# Patient Record
Sex: Male | Born: 1945 | Race: White | Hispanic: No | Marital: Married | Smoking: Former smoker
Health system: Southern US, Community
[De-identification: ages and names within clinical notes are randomized; demographics above are authoritative.]

## PROBLEM LIST (undated history)

## (undated) DIAGNOSIS — E785 Hyperlipidemia, unspecified: Secondary | ICD-10-CM

## (undated) DIAGNOSIS — I10 Essential (primary) hypertension: Secondary | ICD-10-CM

## (undated) DIAGNOSIS — I6340 Cerebral infarction due to embolism of unspecified cerebral artery: Principal | ICD-10-CM

## (undated) DIAGNOSIS — E119 Type 2 diabetes mellitus without complications: Secondary | ICD-10-CM

## (undated) DIAGNOSIS — I499 Cardiac arrhythmia, unspecified: Secondary | ICD-10-CM

## (undated) DIAGNOSIS — R0683 Snoring: Secondary | ICD-10-CM

## (undated) HISTORY — DX: Snoring: R06.83

## (undated) HISTORY — PX: TONSILLECTOMY: SUR1361

## (undated) HISTORY — PX: UMBILICAL HERNIA REPAIR: SHX2598

---

## 2012-11-22 DIAGNOSIS — Z125 Encounter for screening for malignant neoplasm of prostate: Secondary | ICD-10-CM | POA: Diagnosis not present

## 2012-11-22 DIAGNOSIS — E119 Type 2 diabetes mellitus without complications: Secondary | ICD-10-CM | POA: Diagnosis not present

## 2012-11-22 DIAGNOSIS — I10 Essential (primary) hypertension: Secondary | ICD-10-CM | POA: Diagnosis not present

## 2012-11-22 DIAGNOSIS — E669 Obesity, unspecified: Secondary | ICD-10-CM | POA: Diagnosis not present

## 2013-02-04 DIAGNOSIS — H1030 Unspecified acute conjunctivitis, unspecified eye: Secondary | ICD-10-CM | POA: Diagnosis not present

## 2013-02-08 DIAGNOSIS — H1030 Unspecified acute conjunctivitis, unspecified eye: Secondary | ICD-10-CM | POA: Diagnosis not present

## 2013-03-01 DIAGNOSIS — H2510 Age-related nuclear cataract, unspecified eye: Secondary | ICD-10-CM | POA: Diagnosis not present

## 2013-06-03 ENCOUNTER — Encounter (HOSPITAL_COMMUNITY): Payer: Self-pay | Admitting: Radiology

## 2013-06-03 ENCOUNTER — Inpatient Hospital Stay (HOSPITAL_COMMUNITY): Payer: Medicare Other

## 2013-06-03 ENCOUNTER — Emergency Department (HOSPITAL_COMMUNITY): Payer: Medicare Other

## 2013-06-03 ENCOUNTER — Inpatient Hospital Stay (HOSPITAL_COMMUNITY)
Admission: EM | Admit: 2013-06-03 | Discharge: 2013-06-07 | DRG: 041 | Disposition: A | Discharge status: Rehabilitation Facility | Payer: Medicare Other | Attending: Neurology | Admitting: Neurology

## 2013-06-03 DIAGNOSIS — R5383 Other fatigue: Secondary | ICD-10-CM

## 2013-06-03 DIAGNOSIS — I10 Essential (primary) hypertension: Secondary | ICD-10-CM | POA: Diagnosis not present

## 2013-06-03 DIAGNOSIS — I6789 Other cerebrovascular disease: Secondary | ICD-10-CM | POA: Diagnosis not present

## 2013-06-03 DIAGNOSIS — Z87891 Personal history of nicotine dependence: Secondary | ICD-10-CM

## 2013-06-03 DIAGNOSIS — J329 Chronic sinusitis, unspecified: Secondary | ICD-10-CM | POA: Diagnosis present

## 2013-06-03 DIAGNOSIS — R079 Chest pain, unspecified: Secondary | ICD-10-CM | POA: Diagnosis not present

## 2013-06-03 DIAGNOSIS — R29810 Facial weakness: Secondary | ICD-10-CM | POA: Diagnosis present

## 2013-06-03 DIAGNOSIS — Z6835 Body mass index (BMI) 35.0-35.9, adult: Secondary | ICD-10-CM | POA: Diagnosis not present

## 2013-06-03 DIAGNOSIS — I6340 Cerebral infarction due to embolism of unspecified cerebral artery: Principal | ICD-10-CM

## 2013-06-03 DIAGNOSIS — R29818 Other symptoms and signs involving the nervous system: Secondary | ICD-10-CM | POA: Diagnosis not present

## 2013-06-03 DIAGNOSIS — R414 Neurologic neglect syndrome: Secondary | ICD-10-CM | POA: Diagnosis present

## 2013-06-03 DIAGNOSIS — I639 Cerebral infarction, unspecified: Secondary | ICD-10-CM

## 2013-06-03 DIAGNOSIS — E119 Type 2 diabetes mellitus without complications: Secondary | ICD-10-CM | POA: Diagnosis present

## 2013-06-03 DIAGNOSIS — E669 Obesity, unspecified: Secondary | ICD-10-CM | POA: Diagnosis present

## 2013-06-03 DIAGNOSIS — G8190 Hemiplegia, unspecified affecting unspecified side: Secondary | ICD-10-CM | POA: Diagnosis present

## 2013-06-03 DIAGNOSIS — I452 Bifascicular block: Secondary | ICD-10-CM | POA: Diagnosis present

## 2013-06-03 DIAGNOSIS — Z7982 Long term (current) use of aspirin: Secondary | ICD-10-CM | POA: Diagnosis not present

## 2013-06-03 DIAGNOSIS — I739 Peripheral vascular disease, unspecified: Secondary | ICD-10-CM | POA: Diagnosis present

## 2013-06-03 DIAGNOSIS — E785 Hyperlipidemia, unspecified: Secondary | ICD-10-CM | POA: Diagnosis present

## 2013-06-03 DIAGNOSIS — I6330 Cerebral infarction due to thrombosis of unspecified cerebral artery: Secondary | ICD-10-CM | POA: Diagnosis not present

## 2013-06-03 DIAGNOSIS — M5382 Other specified dorsopathies, cervical region: Secondary | ICD-10-CM | POA: Diagnosis not present

## 2013-06-03 DIAGNOSIS — R471 Dysarthria and anarthria: Secondary | ICD-10-CM | POA: Diagnosis present

## 2013-06-03 DIAGNOSIS — Z5189 Encounter for other specified aftercare: Secondary | ICD-10-CM | POA: Diagnosis not present

## 2013-06-03 DIAGNOSIS — I6350 Cerebral infarction due to unspecified occlusion or stenosis of unspecified cerebral artery: Secondary | ICD-10-CM | POA: Diagnosis not present

## 2013-06-03 DIAGNOSIS — R5381 Other malaise: Secondary | ICD-10-CM | POA: Diagnosis not present

## 2013-06-03 DIAGNOSIS — R4789 Other speech disturbances: Secondary | ICD-10-CM | POA: Diagnosis not present

## 2013-06-03 DIAGNOSIS — I517 Cardiomegaly: Secondary | ICD-10-CM

## 2013-06-03 DIAGNOSIS — R930 Abnormal findings on diagnostic imaging of skull and head, not elsewhere classified: Secondary | ICD-10-CM | POA: Diagnosis not present

## 2013-06-03 HISTORY — DX: Hyperlipidemia, unspecified: E78.5

## 2013-06-03 HISTORY — DX: Cerebral infarction due to embolism of unspecified cerebral artery: I63.40

## 2013-06-03 HISTORY — DX: Essential (primary) hypertension: I10

## 2013-06-03 HISTORY — DX: Type 2 diabetes mellitus without complications: E11.9

## 2013-06-03 LAB — URINE MICROSCOPIC-ADD ON

## 2013-06-03 LAB — COMPREHENSIVE METABOLIC PANEL
ALT: 23 U/L (ref 0–53)
AST: 14 U/L (ref 0–37)
Albumin: 3.7 g/dL (ref 3.5–5.2)
Alkaline Phosphatase: 64 U/L (ref 39–117)
BUN: 14 mg/dL (ref 6–23)
CO2: 25 mEq/L (ref 19–32)
Calcium: 8.5 mg/dL (ref 8.4–10.5)
Chloride: 102 mEq/L (ref 96–112)
Creatinine, Ser: 0.88 mg/dL (ref 0.50–1.35)
GFR calc Af Amer: >90 mL/min (ref >90)
GFR calc non Af Amer: 88 mL/min — ABNORMAL LOW (ref >90)
Glucose, Bld: 126 mg/dL — ABNORMAL HIGH (ref 70–99)
Potassium: 3.5 mEq/L — ABNORMAL LOW (ref 3.7–5.3)
Sodium: 140 mEq/L (ref 137–147)
Total Bilirubin: 0.6 mg/dL (ref 0.3–1.2)
Total Protein: 6.9 g/dL (ref 6.0–8.3)

## 2013-06-03 LAB — CBC
HCT: 39.2 % (ref 39.0–52.0)
Hemoglobin: 14.1 g/dL (ref 13.0–17.0)
MCH: 33.4 pg (ref 26.0–34.0)
MCHC: 36 g/dL (ref 30.0–36.0)
MCV: 92.9 fL (ref 78.0–100.0)
Platelets: 172 10*3/uL (ref 150–400)
RBC: 4.22 MIL/uL (ref 4.22–5.81)
RDW: 14.2 % (ref 11.5–15.5)
WBC: 7.8 10*3/uL (ref 4.0–10.5)

## 2013-06-03 LAB — DIFFERENTIAL
Basophils Absolute: 0.1 10*3/uL (ref 0.0–0.1)
Basophils Relative: 1 % (ref 0–1)
Eosinophils Absolute: 0.3 10*3/uL (ref 0.0–0.7)
Eosinophils Relative: 4 % (ref 0–5)
Lymphocytes Relative: 36 % (ref 12–46)
Lymphs Abs: 2.9 10*3/uL (ref 0.7–4.0)
Monocytes Absolute: 0.7 10*3/uL (ref 0.1–1.0)
Monocytes Relative: 9 % (ref 3–12)
Neutro Abs: 3.9 10*3/uL (ref 1.7–7.7)
Neutrophils Relative %: 50 % (ref 43–77)

## 2013-06-03 LAB — GLUCOSE, CAPILLARY
Glucose-Capillary: 191 mg/dL — ABNORMAL HIGH (ref 70–99)
Glucose-Capillary: 142 mg/dL — ABNORMAL HIGH (ref 70–99)
Glucose-Capillary: 156 mg/dL — ABNORMAL HIGH (ref 70–99)
Glucose-Capillary: 170 mg/dL — ABNORMAL HIGH (ref 70–99)

## 2013-06-03 LAB — LIPID PANEL
Cholesterol: 145 mg/dL (ref 0–200)
HDL: 28 mg/dL — ABNORMAL LOW (ref >39)
LDL Cholesterol: 84 mg/dL (ref 0–99)
Total CHOL/HDL Ratio: 5.2 RATIO
Triglycerides: 163 mg/dL — ABNORMAL HIGH (ref <150)
VLDL: 33 mg/dL (ref 0–40)

## 2013-06-03 LAB — POCT I-STAT, CHEM 8
BUN: 13 mg/dL (ref 6–23)
Calcium, Ion: 1.12 mmol/L — ABNORMAL LOW (ref 1.13–1.30)
Chloride: 103 mEq/L (ref 96–112)
Creatinine, Ser: 1.1 mg/dL (ref 0.50–1.35)
Glucose, Bld: 125 mg/dL — ABNORMAL HIGH (ref 70–99)
HCT: 42 % (ref 39.0–52.0)
Hemoglobin: 14.3 g/dL (ref 13.0–17.0)
Potassium: 3.4 mEq/L — ABNORMAL LOW (ref 3.7–5.3)
Sodium: 140 mEq/L (ref 137–147)
TCO2: 24 mmol/L (ref 0–100)

## 2013-06-03 LAB — MRSA PCR SCREENING: MRSA by PCR: NEGATIVE

## 2013-06-03 LAB — TROPONIN I: Troponin I: <0.3 ng/mL (ref <0.30)

## 2013-06-03 LAB — RAPID URINE DRUG SCREEN, HOSP PERFORMED
Amphetamines: NOT DETECTED
Barbiturates: NOT DETECTED
Benzodiazepines: NOT DETECTED
Cocaine: NOT DETECTED
Opiates: NOT DETECTED
Tetrahydrocannabinol: NOT DETECTED

## 2013-06-03 LAB — PROTIME-INR
INR: 1.02 (ref 0.00–1.49)
Prothrombin Time: 13.2 seconds (ref 11.6–15.2)

## 2013-06-03 LAB — HEMOGLOBIN A1C
Hgb A1c MFr Bld: 6.2 % — ABNORMAL HIGH (ref <5.7)
Mean Plasma Glucose: 131 mg/dL — ABNORMAL HIGH (ref <117)

## 2013-06-03 LAB — URINALYSIS, ROUTINE W REFLEX MICROSCOPIC
Bilirubin Urine: NEGATIVE
Glucose, UA: 250 mg/dL — AB
Ketones, ur: 15 mg/dL — AB
Leukocytes, UA: NEGATIVE
Nitrite: NEGATIVE
Protein, ur: NEGATIVE mg/dL
Specific Gravity, Urine: 1.024 (ref 1.005–1.030)
Urobilinogen, UA: 0.2 mg/dL (ref 0.0–1.0)
pH: 7 (ref 5.0–8.0)

## 2013-06-03 LAB — POCT I-STAT TROPONIN I: Troponin i, poc: 0.04 ng/mL (ref 0.00–0.08)

## 2013-06-03 LAB — APTT: aPTT: 31 seconds (ref 24–37)

## 2013-06-03 LAB — ETHANOL: Alcohol, Ethyl (B): <11 mg/dL (ref 0–11)

## 2013-06-03 MED ORDER — LABETALOL HCL 5 MG/ML IV SOLN: 10.0000 mg | INTRAVENOUS | Status: DC | PRN

## 2013-06-03 MED ORDER — CLONIDINE HCL 0.3 MG PO TABS
0.3000 mg | ORAL_TABLET | Freq: Two times a day (BID) | ORAL | Status: DC
  Administered 2013-06-03 – 2013-06-07 (×8): 0.3 mg via ORAL
  Filled 2013-06-03 (×9): qty 1

## 2013-06-03 MED ORDER — HYDRALAZINE HCL 20 MG/ML IJ SOLN
10.0000 mg | INTRAMUSCULAR | Status: DC | PRN
  Administered 2013-06-03 – 2013-06-06 (×3): 10 mg via INTRAVENOUS
  Filled 2013-06-03 (×2): qty 1

## 2013-06-03 MED ORDER — AMOXICILLIN 500 MG PO CAPS
500.0000 mg | ORAL_CAPSULE | Freq: Three times a day (TID) | ORAL | Status: DC
  Administered 2013-06-03 – 2013-06-07 (×12): 500 mg via ORAL
  Filled 2013-06-03 (×14): qty 1

## 2013-06-03 MED ORDER — CARVEDILOL 12.5 MG PO TABS
12.5000 mg | ORAL_TABLET | Freq: Two times a day (BID) | ORAL | Status: DC
  Administered 2013-06-03 – 2013-06-07 (×9): 12.5 mg via ORAL
  Filled 2013-06-03 (×10): qty 1

## 2013-06-03 MED ORDER — LABETALOL HCL 5 MG/ML IV SOLN
INTRAVENOUS | Status: AC
  Administered 2013-06-03: 10 mg
  Filled 2013-06-03: qty 4

## 2013-06-03 MED ORDER — GLYBURIDE 5 MG PO TABS
5.0000 mg | ORAL_TABLET | Freq: Every day | ORAL | Status: DC
  Administered 2013-06-04 – 2013-06-07 (×4): 5 mg via ORAL
  Filled 2013-06-03 (×5): qty 1

## 2013-06-03 MED ORDER — IOHEXOL 350 MG/ML SOLN
100.0000 mL | Freq: Once | INTRAVENOUS | Status: AC | PRN
  Administered 2013-06-03: 100 mL via INTRAVENOUS

## 2013-06-03 MED ORDER — NICARDIPINE HCL IN NACL 20-0.86 MG/200ML-% IV SOLN
5.0000 mg/h | INTRAVENOUS | Status: DC
  Administered 2013-06-03: 10 mg/h via INTRAVENOUS

## 2013-06-03 MED ORDER — HYDRALAZINE HCL 20 MG/ML IJ SOLN
INTRAMUSCULAR | Status: AC
  Filled 2013-06-03: qty 1

## 2013-06-03 MED ORDER — ACETAMINOPHEN 325 MG PO TABS
650.0000 mg | ORAL_TABLET | ORAL | Status: DC | PRN
  Administered 2013-06-03 – 2013-06-07 (×14): 650 mg via ORAL
  Filled 2013-06-03 (×14): qty 2

## 2013-06-03 MED ORDER — LABETALOL HCL 5 MG/ML IV SOLN
10.0000 mg | INTRAVENOUS | Status: DC | PRN
  Administered 2013-06-03 (×2): 10 mg via INTRAVENOUS
  Administered 2013-06-03 – 2013-06-06 (×3): 20 mg via INTRAVENOUS
  Administered 2013-06-06: 40 mg via INTRAVENOUS
  Filled 2013-06-03 (×2): qty 4
  Filled 2013-06-03: qty 8
  Filled 2013-06-03 (×2): qty 4

## 2013-06-03 MED ORDER — ACETAMINOPHEN 650 MG RE SUPP: 650.0000 mg | RECTAL | Status: DC | PRN

## 2013-06-03 MED ORDER — LISINOPRIL 40 MG PO TABS
40.0000 mg | ORAL_TABLET | Freq: Every day | ORAL | Status: DC
  Administered 2013-06-03 – 2013-06-07 (×5): 40 mg via ORAL
  Filled 2013-06-03 (×5): qty 1

## 2013-06-03 MED ORDER — SODIUM CHLORIDE 0.9 % IV SOLN
INTRAVENOUS | Status: DC
  Administered 2013-06-03 – 2013-06-06 (×5): via INTRAVENOUS
  Administered 2013-06-07: 1000 mL via INTRAVENOUS

## 2013-06-03 MED ORDER — ALTEPLASE (STROKE) FULL DOSE INFUSION
90.0000 mg | Freq: Once | INTRAVENOUS | Status: AC
  Administered 2013-06-03: 81 mg via INTRAVENOUS
  Filled 2013-06-03: qty 90

## 2013-06-03 MED ORDER — PANTOPRAZOLE SODIUM 40 MG IV SOLR
40.0000 mg | Freq: Every day | INTRAVENOUS | Status: DC
  Administered 2013-06-03 – 2013-06-06 (×4): 40 mg via INTRAVENOUS
  Filled 2013-06-03 (×5): qty 40

## 2013-06-03 MED ORDER — SENNOSIDES-DOCUSATE SODIUM 8.6-50 MG PO TABS
1.0000 | ORAL_TABLET | Freq: Every evening | ORAL | Status: DC | PRN
  Filled 2013-06-03: qty 1

## 2013-06-03 MED ORDER — NICARDIPINE HCL IN NACL 20-0.86 MG/200ML-% IV SOLN
3.0000 mg/h | INTRAVENOUS | Status: DC
  Administered 2013-06-03 (×3): 5 mg/h via INTRAVENOUS
  Filled 2013-06-03 (×4): qty 200

## 2013-06-03 MED ORDER — METFORMIN HCL ER 750 MG PO TB24
1500.0000 mg | ORAL_TABLET | Freq: Every day | ORAL | Status: DC
  Administered 2013-06-04 – 2013-06-07 (×4): 1500 mg via ORAL
  Filled 2013-06-03 (×5): qty 2

## 2013-06-03 MED ORDER — SODIUM CHLORIDE 0.9 % IV SOLN: 250.0000 mL | Freq: Once | INTRAVENOUS | Status: DC

## 2013-06-03 MED ORDER — INSULIN ASPART 100 UNIT/ML ~~LOC~~ SOLN
0.0000 [IU] | Freq: Three times a day (TID) | SUBCUTANEOUS | Status: DC
  Administered 2013-06-03: 2 [IU] via SUBCUTANEOUS
  Administered 2013-06-03 – 2013-06-04 (×4): 3 [IU] via SUBCUTANEOUS
  Administered 2013-06-05: 2 [IU] via SUBCUTANEOUS
  Administered 2013-06-05: 3 [IU] via SUBCUTANEOUS
  Administered 2013-06-06 – 2013-06-07 (×4): 2 [IU] via SUBCUTANEOUS

## 2013-06-03 MED ORDER — AMLODIPINE BESYLATE 5 MG PO TABS
5.0000 mg | ORAL_TABLET | Freq: Every day | ORAL | Status: DC
  Administered 2013-06-03 – 2013-06-05 (×3): 5 mg via ORAL
  Filled 2013-06-03 (×3): qty 1

## 2013-06-03 NOTE — ED Notes (Signed)
Assisting in transport of patient to CT and then on to a room assignment.

## 2013-06-03 NOTE — Progress Notes (Signed)
UR completed.  Kindred Reidinger, RN BSN MHA CCM Trauma/Neuro ICU Case Manager [phone]  

## 2013-06-03 NOTE — Evaluation (Signed)
Clinical/Bedside Swallow Evaluation Patient Details  Name: Donald [name] MRN: [id number] Date of Birth: 17-Feb-1946  Today's Date: 06/03/2013 Time: 1142-1200 SLP Time Calculation (min): 18 min  Past Medical History: History reviewed. No pertinent past medical history. Past Surgical History: History reviewed. No pertinent past surgical history. HPI:  68 y.o. male presenting with left facial droop, LHH, right gaze preference and left neglect. Patient with vascular risk factors of diabetes and hypertension. CT of the head reviewed and shows no acute changes. 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 tPA given.    Assessment / Plan / Recommendation Clinical Impression  Pt presents with evidence of a mild oral dysphagia with left buccal residuals and minimal anterior spillage. With sips of thin liquids there were no overt signs of aspiration with self fed small sips. Pt did have a very hard cough response with first sip given, total assist by RN, during stroke swallow screen. Pt appears able to tolerate liquids with precautions and soft solids with reminders to clear left buccal cavity. Discussed precautions with pt/family and RN.     Aspiration Risk  Moderate    Diet Recommendation Thin liquid;Dysphagia 2 (Fine chop)   Liquid Administration via: Cup;Straw Medication Administration: Whole meds with liquid Supervision: Patient able to self feed;Full supervision/cueing for compensatory strategies Compensations: Slow rate;Small sips/bites;Check for pocketing;Check for anterior loss Postural Changes and/or Swallow Maneuvers: Seated upright 90 degrees;Upright 30-60 min after meal    Other  Recommendations Oral Care Recommendations: Oral care BID   Follow Up Recommendations  24 hour supervision/assistance;Inpatient Rehab    Frequency and Duration min 2x/week  2 weeks   Pertinent Vitals/Pain NA    SLP Swallow Goals     Swallow Study Prior Functional Status       General HPI: 68 y.o.  male presenting with left facial droop, LHH, right gaze preference and left neglect. Patient with vascular risk factors of diabetes and hypertension. CT of the head reviewed and shows no acute changes. 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 tPA given.  Type of Study: Bedside swallow evaluation Diet Prior to this Study: NPO Temperature Spikes Noted: No Respiratory Status: Room air History of Recent Intubation: No Behavior/Cognition: Alert;Cooperative;Pleasant mood Oral Cavity - Dentition: Adequate natural dentition Self-Feeding Abilities: Able to feed self Patient Positioning: Upright in bed Baseline Vocal Quality: Clear Volitional Cough: Strong Volitional Swallow: Able to elicit    Oral/Motor/Sensory Function Overall Oral Motor/Sensory Function: Impaired Labial ROM: Reduced left Labial Symmetry: Abnormal symmetry left Labial Strength: Reduced Labial Sensation: Reduced Lingual ROM: Reduced left Lingual Symmetry: Abnormal symmetry right Lingual Strength: Reduced Lingual Sensation: Reduced Facial ROM: Reduced left Facial Strength: Reduced Facial Sensation: Reduced Velum: Within Functional Limits Mandible: Within Functional Limits   Ice Chips Ice chips: Within functional limits   Thin Liquid Thin Liquid: Impaired Presentation: Cup;Straw Oral Phase Impairments: Reduced labial seal Oral Phase Functional Implications: Right anterior spillage;Left anterior spillage;Prolonged oral transit    Nectar Thick Nectar Thick Liquid: Not tested   Honey Thick Honey Thick Liquid: Not tested   Puree Puree: Within functional limits   Solid   GO    Solid: Impaired Presentation: Self Fed Oral Phase Impairments: Reduced labial seal;Impaired anterior to posterior transit Oral Phase Functional Implications: Left anterior spillage;Left lateral sulci pocketing;Oral residue      [facility], [place] CCC-SLP [phone]   Lynann Beaver 06/03/2013,12:13 PM

## 2013-06-03 NOTE — ED Notes (Signed)
MD at bedside. Dr Reynolds 

## 2013-06-03 NOTE — H&P (Signed)
Admission H&P    Chief Complaint: Difficulty with speech, left sided weakness  HPI: Donald [name] is an 68 y.o. male who is visiting here for the holidays.  Awakened this morning to help his grandchildren to bed.  When he got up he was normal.  As he was returning to bed began to be short of breath.  Patient then was noted to have slurred speech by his wife and she noted a facial droop.  Patient seemed to be weak on the left a well.  EMS was called and the patient was brought in as a code stroke.  Initial NIHSS of 8.    Date last known well: Date: 06/03/2013 Time last known well: Time: 03:00 tPA Given: Yes  Past medical history: Diabetes, hypertension  Past surgical history None  Family history: Mother died with vascular dementia.  Father died in his 20's of a MI.  Has 3 half-sisters with CAD as well.  2 have died of MI's.    Social History:  The patient has quit smoking.  There is no history of alcohol or illicit drug abuse.  Allergies: No Known Allergies   Medications: Current outpatient prescriptions: amLODipine (NORVASC) 5 MG tablet, Take 5 mg by mouth daily., Disp: , Rfl: ;   aspirin EC 81 MG tablet, Take 81 mg by mouth daily., Disp: , Rfl: ;   carvedilol (COREG) 12.5 MG tablet, Take 12.5 mg by mouth 2 (two) times daily with a meal., Disp: , Rfl: ;   cloNIDine (CATAPRES) 0.3 MG tablet, Take 0.3 mg by mouth 2 (two) times daily., Disp: , Rfl:  glyBURIDE (DIABETA) 5 MG tablet, Take 5 mg by mouth daily with breakfast., Disp: , Rfl: ;   lisinopril (PRINIVIL,ZESTRIL) 40 MG tablet, Take 40 mg by mouth daily., Disp: , Rfl: ;   metFORMIN (GLUCOPHAGE-XR) 750 MG 24 hr tablet, Take 1,500 mg by mouth daily with breakfast., Disp: , Rfl:   ROS: History obtained from wife  General ROS: negative for - chills, fatigue, fever, night sweats, weight gain or weight loss Psychological ROS: negative for - behavioral disorder, hallucinations, memory difficulties, mood swings or suicidal  ideation Ophthalmic ROS: negative for - blurry vision, double vision, eye pain or loss of vision ENT ROS: negative for - epistaxis, nasal discharge, oral lesions, sore throat, tinnitus or vertigo Allergy and Immunology ROS: negative for - hives or itchy/watery eyes Hematological and Lymphatic ROS: negative for - bleeding problems, bruising or swollen lymph nodes Endocrine ROS: negative for - galactorrhea, hair pattern changes, polydipsia/polyuria or temperature intolerance Respiratory ROS: negative for - cough, hemoptysis, shortness of breath or wheezing Cardiovascular ROS: negative for - chest pain, dyspnea on exertion, edema or irregular heartbeat Gastrointestinal ROS: negative for - abdominal pain, diarrhea, hematemesis, nausea/vomiting or stool incontinence Genito-Urinary ROS: negative for - dysuria, hematuria, incontinence or urinary frequency/urgency Musculoskeletal ROS: negative for - joint swelling or muscular weakness Neurological ROS: as noted in HPI Dermatological ROS: negative for rash and skin lesion changes  Physical Examination: Blood pressure 170/82, pulse 71, resp. rate 21, SpO2 98.00%.  General Examination: HEENT-  Normocephalic, no lesions, without obvious abnormality.  Normal external eye and conjunctiva.  Normal TM's bilaterally.  Normal auditory canals and external ears. Normal external nose, mucus membranes and septum.  Normal pharynx. Neck supple with no masses, nodes, nodules or enlargement. Cardiovascular - S1, S2 normal Lungs - chest clear, no wheezing, rales, normal symmetric air entry Abdomen - soft, non-tender; bowel sounds normal; no masses,  no organomegaly,  protuberant Extremities - mild edema in the lower extremities bilaterally  Neurologic Examination: Mental Status: Lethargic, oriented, thought content appropriate.  Speech fluent but dysarthric.  Able to follow 3 step commands without difficulty.  Left neglect Cranial Nerves: II: Discs flat  bilaterally; LHH, pupils equal, round, reactive to light and accommodation III,IV, VI: ptosis not present, right gaze preference with patient able to voluntarily move eyes horizontally to the left.  Vertical gaze intact.  V,VII: left facial droop, facial light touch sensation decreased on the left VIII: hearing normal bilaterally IX,X: gag reflex reduced XI: bilateral shoulder shrug XII: midline tongue extension Motor: Right : Upper extremity   5/5    Left:     Upper extremity   5-/5  Lower extremity   5/5     Lower extremity   5/5 Tone and bulk:normal tone throughout; no atrophy noted Sensory: Pinprick and light touch intact throughout, bilaterally Deep Tendon Reflexes: 2+ and symmetric with absent AJ's bilaterally Plantars: Right: downgoing   Left: upgoing Cerebellar: normal finger-to-nose testing on the right and normal heel-to-shin testing bilaterally.  Patient unable to appreciate the left upper extremity in space. Gait: Unable to test CV: pulses palpable throughout   Laboratory Studies:   Basic Metabolic Panel:  Recent Labs Lab 06/03/13 0355 06/03/13 0400  NA 140 140  K 3.5* 3.4*  CL 102 103  CO2 25  --   GLUCOSE 126* 125*  BUN 14 13  CREATININE 0.88 1.10  CALCIUM 8.5  --     Liver Function Tests:  Recent Labs Lab 06/03/13 0355  AST 14  ALT 23  ALKPHOS 64  BILITOT 0.6  PROT 6.9  ALBUMIN 3.7   No results found for this basename: LIPASE, AMYLASE,  in the last 168 hours No results found for this basename: AMMONIA,  in the last 168 hours  CBC:  Recent Labs Lab 06/03/13 0355 06/03/13 0400  WBC 7.8  --   NEUTROABS 3.9  --   HGB 14.1 14.3  HCT 39.2 42.0  MCV 92.9  --   PLT 172  --     Cardiac Enzymes:  Recent Labs Lab 06/03/13 0355  TROPONINI <0.30    BNP: No components found with this basename: POCBNP,   CBG: No results found for this basename: GLUCAP,  in the last 168 hours  Microbiology: No results found for this or any previous  visit.  Coagulation Studies:  Recent Labs  06/03/13 0355  LABPROT 13.2  INR 1.02    Urinalysis: No results found for this basename: COLORURINE, APPERANCEUR, LABSPEC, PHURINE, GLUCOSEU, HGBUR, BILIRUBINUR, KETONESUR, PROTEINUR, UROBILINOGEN, NITRITE, LEUKOCYTESUR,  in the last 168 hours  Lipid Panel:  No results found for this basename: chol,  trig,  hdl,  cholhdl,  vldl,  ldlcalc    HgbA1C:  No results found for this basename: HGBA1C    Urine Drug Screen:   No results found for this basename: labopia,  cocainscrnur,  labbenz,  amphetmu,  thcu,  labbarb    Alcohol Level:   Recent Labs Lab 06/03/13 [place] <11    Other results: EKG: sinus rhythm at 70 bpm.  Imaging: Ct Head Wo Contrast  06/03/2013   CLINICAL DATA:  Left-sided weakness and slurred speech. Code stroke.  EXAM: CT HEAD WITHOUT CONTRAST  TECHNIQUE: Contiguous axial images were obtained from the base of the skull through the vertex without intravenous contrast.  COMPARISON:  None.  FINDINGS: Ventricles and sulci appear symmetrical. No mass effect or midline  shift. No abnormal extra-axial fluid collections. Gray-white matter junctions are distinct. Basal cisterns are not effaced. No evidence of acute intracranial hemorrhage. No depressed skull fractures. Mucosal thickening in the maxillary antra. Mastoid air cells are not opacified.  IMPRESSION: No acute intracranial abnormalities.  Code stroke results were telephoned to Dr. Christy Gentles at [place] [facility] on 06/03/2013   Electronically Signed   By: Lucienne Capers M.D.   On: 06/03/2013 04:08    Assessment: 68 y.o. male presenting with left facial droop, LHH, right gaze preference and left neglect.  Patient with vascular risk factors of diabetes and hypertension.  CT of the head reviewed and shows no acute changes.  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  Contraindications to tPA reviewed.  Risks and benefits of tPA discussed with patient and family.  Verbal consent obtained.   BP initially elevated and controlled with Cardene.  tPA administered.     Stroke Risk Factors - diabetes mellitus and hypertension  Plan: 1. HgbA1c, fasting lipid panel 2. MRI of the brain without contrast 3. PT consult, OT consult, Speech consult 4. Echocardiogram 5. Carotid dopplers 6. Prophylactic therapy-None 7. Risk factor modification 8. Telemetry monitoring 9. Frequent neuro checks 10. Cardene for BP control 11. Sliding scale insulin coverage 12. Repeat head CT in 24 hours 13. Admit to ICU 13. CTA of the head and neck  This patient is critically ill and at significant risk of neurological worsening, death and care requires constant monitoring of vital signs, hemodynamics,respiratory and cardiac monitoring, neurological assessment, discussion with family, other specialists and medical decision making of high complexity. I spent 90 minutes of neurocritical care time  in the care of  this patient.   Alexis Goodell, MD [facility] [phone] 06/03/2013, 4:57 AM

## 2013-06-03 NOTE — Progress Notes (Addendum)
Stroke Team Progress Note  HISTORY Donald [name] is an 68 y.o. male who is visiting here for the holidays. Awakened this morning 06/03/2013 to help his grandchildren to bed. When he got up he was normal. As he was returning to bed began to be short of breath. Patient then was noted to have slurred speech by his wife and she noted a facial droop. Patient seemed to be weak on the left a well. EMS was called and the patient was brought in as a code stroke. Initial NIHSS of 8. Patient was given TPA. No intervenable lesion was seen on CTA. He was admitted to the neuro ICU for further evaluation and treatment.  SUBJECTIVE Per RN, stable since arrival to the floor. Discussed care plan with family. Patient states no acute issues/pain. On no cardene gtt. Per RN failed bedside swallow, pending SLP evaluation.   OBJECTIVE Most recent Vital Signs: Filed Vitals:   06/03/13 0645 06/03/13 0700 06/03/13 0729 06/03/13 0730  BP: 154/81 170/76  182/88  Pulse: 63 64  65  Temp:   97.5 F (36.4 C)   TempSrc:   Oral   Resp: 15 29  21   Height:      Weight:      SpO2: 97% 97%  98%   CBG (last 3)  No results found for this basename: GLUCAP,  in the last 72 hours  IV Fluid Intake:   . sodium chloride 75 mL/hr at 06/03/13 0700  . niCARDipine Stopped (06/03/13 0541)  . niCARDipine Stopped (06/03/13 0630)    MEDICATIONS  . sodium chloride  250 mL Intravenous Once  . insulin aspart  0-15 Units Subcutaneous TID WC  . pantoprazole (PROTONIX) IV  40 mg Intravenous QHS   PRN:  acetaminophen, acetaminophen, senna-docusate  Diet:  NPO  Activity:  Bedrest DVT Prophylaxis:  SCDs   CLINICALLY SIGNIFICANT STUDIES Basic Metabolic Panel:  Recent Labs Lab 06/03/13 0355 06/03/13 0400  NA 140 140  K 3.5* 3.4*  CL 102 103  CO2 25  --   GLUCOSE 126* 125*  BUN 14 13  CREATININE 0.88 1.10  CALCIUM 8.5  --    Liver Function Tests:  Recent Labs Lab 06/03/13 0355  AST 14  ALT 23  ALKPHOS 64  BILITOT 0.6   PROT 6.9  ALBUMIN 3.7   CBC:  Recent Labs Lab 06/03/13 0355 06/03/13 0400  WBC 7.8  --   NEUTROABS 3.9  --   HGB 14.1 14.3  HCT 39.2 42.0  MCV 92.9  --   PLT 172  --    Coagulation:  Recent Labs Lab 06/03/13 0355  LABPROT 13.2  INR 1.02   Cardiac Enzymes:  Recent Labs Lab 06/03/13 0355  TROPONINI <0.30   Urinalysis:  Recent Labs Lab 06/03/13 0605  COLORURINE YELLOW  LABSPEC 1.024  PHURINE 7.0  GLUCOSEU 250*  HGBUR SMALL*  BILIRUBINUR NEGATIVE  KETONESUR 15*  PROTEINUR NEGATIVE  UROBILINOGEN 0.2  NITRITE NEGATIVE  LEUKOCYTESUR NEGATIVE   Lipid Panel    Component Value Date/Time   CHOL 145 06/03/2013 0455   TRIG 163* 06/03/2013 0455   HDL 28* 06/03/2013 0455   CHOLHDL 5.2 06/03/2013 0455   VLDL 33 06/03/2013 0455   LDLCALC 84 06/03/2013 0455   HgbA1C  No results found for this basename: HGBA1C    Urine Drug Screen:     Component Value Date/Time   LABOPIA NONE DETECTED 06/03/2013 0605   COCAINSCRNUR NONE DETECTED 06/03/2013 0605   LABBENZ NONE DETECTED 06/03/2013  [place] 06/03/2013 0605   THCU NONE DETECTED 06/03/2013 0605   LABBARB NONE DETECTED 06/03/2013 0605    Alcohol Level:  Recent Labs Lab 06/03/13 0355  ETH <11    CT of the brain  06/03/2013   No acute intracranial abnormalities.    CT angio head  06/03/2013    1. Subtle attenuation of the distal right superior MCA territory branch vessels as compared to the left. No proximal branch occlusion or high-grade flow-limiting stenosis identified within the right middle cerebral artery proximally. 2. Otherwise unremarkable CTA of the head without evidence of occlusion, high-grade stenosis, or aneurysm elsewhere within the brain.   CT angio neck1/07/2013    1. Normal CTA of the neck without evidence of high-grade stenosis, occlusion, or dissection. 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 Prolonged PR interval.  Incomplete RBBB and LAFB. Consider anterolateral infarct. Abnormal T, consider ischemia, lateral leads.   Therapy Recommendations pending  Physical Exam   General Examination:  HEENT- Normocephalic, no lesions, without obvious abnormality. Normal external eye and conjunctiva. Neck supple with no masses, nodes, nodules or enlargement.  Cardiovascular - S1, S2 normal  Lungs - chest clear, no wheezing, rales, normal symmetric air entry  Abdomen - soft, non-tender; bowel sounds normal; no masses, no organomegaly, protuberant  Extremities - mild edema in the lower extremities bilaterally  Neurologic Examination:  Mental Status:  Lethargic, oriented to name/location/Jan 2015, thought content appropriate. Speech fluent but dysarthric. Able to follow 3 step commands without difficulty. Left neglect  Cranial Nerves:  II:  L inferior quadrantnopia, pupils equal, round, reactive to light III,IV, VI: ptosis not present, right gaze preference with patient able to voluntarily move eyes horizontally to the left. Vertical gaze intact.  V,VII: left facial droop, facial light touch sensation decreased on the left  VIII: hearing normal bilaterally  IX,X: gag reflex reduced  XI: bilateral shoulder shrug  XII: midline tongue extension  Motor:  Right : Upper extremity 5/5 Left: Upper extremity 3/5  Lower extremity 5/5 Left Lower extremity 4+/5  Tone and bulk:normal tone throughout; no atrophy noted  Sensory: absent LT on the left, neglect with abnormal 2 point discrimination Deep Tendon Reflexes: 2+ and symmetric with absent AJ's bilaterally  Plantars:  Right: downgoing Left: upgoing  Cerebellar:  normal finger-to-nose testing on the right and normal heel-to-shin testing bilaterally. Patient unable to appreciate the left upper extremity in space.  Gait: Unable to test  CV: pulses palpable throughout    ASSESSMENT Donald [name] is a 68 y.o. male presenting with Difficulty with speech, left  sided weakness. Status post IV t-PA 06/03/2013 at 0437.  Suspect a right brain infarct. Etiology unclear at this time. On aspirin 81 mg orally every day prior to admission. Now on no antithrombotics as within 24h of tPA for secondary stroke prevention. Work up underway.  Hypertension, BP 189/89 on arrival, cardene for control in order to administer TPA. cardene now off. Diabetes, HgbA1c pending, goal < 7.0 Ischemic infarct s/p [facility] day # 0  TREATMENT/PLAN  Resume  aspirin for secondary stroke prevention in 24h imaging negative for hemorrhage. First dose tomorrow after 0500 and before 12 midnight. Consider changing to plavix pending etiology  D/c carotid doppler as CTA neck completed  2D echo  LDL 84, currently NPO, start statin prior to discharge  Keep in bed for now.   NPO pending SLP evaluation  PT/OT evaluation when medically stable  SHARON BIBY, MSN, RN, ANVP-BC, ANP-BC, [facility] Pager: [phone] 06/03/2013 7:48 AM  I have personally obtained a history, examined the patient, evaluated imaging results, and formulated the assessment and plan of care. I agree with the above.   This patient is critically ill and at significant risk of neurological worsening, death and care requires constant monitoring of vital signs, hemodynamics,respiratory and cardiac monitoring, neurological assessment, other specialists and medical decision making of high complexity.  A total of 45 minutes was spent in patient care.    Jim Like, DO  Neurology-Stroke

## 2013-06-03 NOTE — Progress Notes (Signed)
  Echocardiogram 2D Echocardiogram has been performed.  Mauricio Po 06/03/2013, 4:20 PM

## 2013-06-03 NOTE — Progress Notes (Signed)
Dr Janann Colonel made aware that patient waning with drift in LUE, touching bed at times and lethargic. Dr Janann Colonel stated due to patients lack of sleep in last 24hrs, no follow up actions needed. Will continue to monitor.

## 2013-06-03 NOTE — ED Notes (Signed)
Flushed Foley Cath. NO return of Urine. No resistance while flushing. Foley cath. Inserted to bifurication fo foley.

## 2013-06-03 NOTE — ED Notes (Signed)
Labetalol 10 mg IVP (2nd dose for total of 20 mg)

## 2013-06-03 NOTE — Progress Notes (Signed)
BP elevated > 185 - receiving multiple doses of labetalol, now with HR dropping. Will give prn hydralazine as well as resume home BP meds as pt is able to swallow.  Burnetta Sabin, MSN, RN, ANVP-BC, ANP-BC, GNP-BC [facility] Pager: [phone] 06/03/2013 3:53 PM

## 2013-06-03 NOTE — ED Notes (Signed)
Developed sudden left sided weakness and slurred speech when picking up his granddaughter.  Pt. Very hypertensive per EMS (see EMS Notes).

## 2013-06-03 NOTE — ED Provider Notes (Signed)
CSN: [id number]     Arrival date & time 06/03/13  0354 History   First MD Initiated Contact with Patient 06/03/13 0405     Chief Complaint - weakness  Patient is a 68 y.o. male presenting with weakness. The history is provided by the patient and a relative. History limited by: [place].  Weakness This is a new problem. Episode onset: JUST PRIOR TO ARRIVAL. The problem occurs constantly. The problem has been rapidly worsening. Pertinent negatives include no chest pain, no abdominal pain, no headaches and no shortness of breath. Nothing aggravates the symptoms. Nothing relieves the symptoms. He has tried rest for the symptoms. The treatment provided no relief.  PATIENT PRESENTS FOR ACUTE ONSET OF LEFT FACIAL WEAKNESS AND DIFFICULTY SPEAKING PT WAS UP WATCHING TV WITH FAMILY AT APPROXIMATELY 0300 (LAST KNOWN WELL) PT BEGAN TO HAVE SYMPTOMS HE DENIES CP/SOB/HA NO FEVER REPORTED NO VOMITING/DIARRHEA/RECTAL BLEEDING REPORTED   PMH - htn/diabetes Soc hx - lives at home with family  No past surgical history on file. No family history on file. History  Substance Use Topics  . Smoking status: Not on file  . Smokeless tobacco: Not on file  . Alcohol Use: Not on file    Review of Systems  Unable to perform ROS: Acuity of condition  Respiratory: Negative for shortness of breath.   Cardiovascular: Negative for chest pain.  Gastrointestinal: Negative for abdominal pain and blood in stool.  Neurological: Positive for weakness. Negative for headaches.    Allergies  Review of patient's allergies indicates no known allergies.  Home Medications  No current outpatient prescriptions on file. Physical Exam PT IS HYPERTENSIVE ON VITAL SIGNS   CONSTITUTIONAL: Well developed/well nourished HEAD: Normocephalic/atraumatic EYES:PERRL ENMT: Mucous membranes moist NECK: supple no meningeal signs SPINE:entire spine nontender CV: S1/S2 noted, no murmurs/rubs/gallops noted LUNGS:  Lungs are clear to auscultation bilaterally, no apparent distress ABDOMEN: soft, nontender, no rebound or guarding, obese NEURO: Pt is awake/alert left facial droop.  No focal motor deficits noted in extremities He appears to have left sided neglect EXTREMITIES: pulses normal, full ROM SKIN: warm, color normal PSYCH: no abnormalities of mood noted   ED Course  Procedures  CRITICAL CARE Performed by: Sharyon Cable Total critical care time: 33 Critical care time was exclusive of separately billable procedures and treating other patients. Critical care was necessary to treat or prevent imminent or life-threatening deterioration. Critical care was time spent personally by me on the following activities: development of treatment plan with patient and/or surrogate as well as nursing, discussions with consultants, evaluation of patient's response to treatment, examination of patient, obtaining history from patient or surrogate, ordering and performing treatments and interventions, ordering and review of laboratory studies, ordering and review of radiographic studies, pulse oximetry and re-evaluation of patient's condition.  Patient was seen on arrival at bridge for concern for stroke.  He had obvious signs of stroke on arrival but was maintaining his airway.  CT head was read as negative at approximately 0401am He was last known well at 0300 D/w dr Alexis Goodell, neuro, given acuity of his condition, onset and likely acute CVA, he is TPA candidate She has ordered IV BP meds as well as IV TPA   5:11 AM Pt stable at this time He will be placed in ICU after given TPA D/w dr Doy Mince about his care   Labs Review Labs Reviewed  POCT I-STAT, CHEM 8 - Abnormal; Notable for the following:    Potassium 3.4 (*)  Glucose, Bld 125 (*)    Calcium, Ion 1.12 (*)    All other components within normal limits  PROTIME-INR  APTT  CBC  DIFFERENTIAL  ETHANOL  COMPREHENSIVE METABOLIC PANEL   TROPONIN I  URINE RAPID DRUG SCREEN (HOSP PERFORMED)  URINALYSIS, ROUTINE W REFLEX MICROSCOPIC  POCT I-STAT TROPONIN I   Imaging Review Ct Head Wo Contrast  06/03/2013   CLINICAL DATA:  Left-sided weakness and slurred speech. Code stroke.  EXAM: CT HEAD WITHOUT CONTRAST  TECHNIQUE: Contiguous axial images were obtained from the base of the skull through the vertex without intravenous contrast.  COMPARISON:  None.  FINDINGS: Ventricles and sulci appear symmetrical. No mass effect or midline shift. No abnormal extra-axial fluid collections. Gray-white matter junctions are distinct. Basal cisterns are not effaced. No evidence of acute intracranial hemorrhage. No depressed skull fractures. Mucosal thickening in the maxillary antra. Mastoid air cells are not opacified.  IMPRESSION: No acute intracranial abnormalities.  Code stroke results were telephoned to Dr. Christy Gentles at [facility] on 06/03/2013   Electronically Signed   By: Lucienne Capers M.D.   On: 06/03/2013 04:08    EKG Interpretation    Date/Time:  Friday June 03 2013 04:11:15 EST Ventricular Rate:  70 PR Interval:  232 QRS Duration: 108 QT Interval:  429 QTC Calculation: 463 R Axis:   -57 Text Interpretation:  Sinus rhythm Prolonged PR interval Incomplete RBBB and LAFB Consider anterolateral infarct Abnormal T, consider ischemia, lateral leads Confirmed by Christy Gentles  MD, Holston Oyama ([id number]) on 06/03/2013 5:11:09 AM            MDM  No diagnosis found. Nursing notes including past medical history and social history reviewed and considered in documentation Labs/vital reviewed and considered     Sharyon Cable, MD 06/03/13 [phone]

## 2013-06-03 NOTE — Progress Notes (Signed)
Urine specific gravity check with result of 1.008

## 2013-06-03 NOTE — ED Notes (Signed)
Family at bedside. 

## 2013-06-03 NOTE — ED Notes (Signed)
Patient transported to CT 

## 2013-06-03 NOTE — Progress Notes (Signed)
Dr Armida Sans notified of patient with increasing difficulty raising LUE and LLE. Pt stating he feels increasingly tired from not sleeping since 12/30 due to staying up last night watching TV with family. Pt with no changes in cognition or pain besides "sinus pressure behind my right eye". Orders received for amoxacillin abx due to sinusitis on MRI. Will continue to monitor.

## 2013-06-04 ENCOUNTER — Encounter (HOSPITAL_COMMUNITY): Payer: Self-pay | Admitting: *Deleted

## 2013-06-04 DIAGNOSIS — I6350 Cerebral infarction due to unspecified occlusion or stenosis of unspecified cerebral artery: Secondary | ICD-10-CM | POA: Diagnosis not present

## 2013-06-04 LAB — GLUCOSE, CAPILLARY
Glucose-Capillary: 167 mg/dL — ABNORMAL HIGH (ref 70–99)
Glucose-Capillary: 151 mg/dL — ABNORMAL HIGH (ref 70–99)
Glucose-Capillary: 97 mg/dL (ref 70–99)
Glucose-Capillary: 117 mg/dL — ABNORMAL HIGH (ref 70–99)

## 2013-06-04 MED ORDER — CLOPIDOGREL BISULFATE 75 MG PO TABS
75.0000 mg | ORAL_TABLET | Freq: Every day | ORAL | Status: DC
  Administered 2013-06-04 – 2013-06-07 (×4): 75 mg via ORAL
  Filled 2013-06-04 (×5): qty 1

## 2013-06-04 NOTE — Progress Notes (Addendum)
Stroke Team Progress Note  HISTORY Donald [name] is an 68 y.o. male who is visiting here for the holidays. Awakened this morning 06/03/2013 to help his grandchildren to bed. When he got up he was normal. As he was returning to bed began to be short of breath. Patient then was noted to have slurred speech by his wife and she noted a facial droop. Patient seemed to be weak on the left a well. EMS was called and the patient was brought in as a code stroke. Initial NIHSS of 8. Patient was given TPA. No intervenable lesion was seen on CTA. He was admitted to the neuro ICU for further evaluation and treatment.  SUBJECTIVE Per RN, had some elevation in BP, briefly started on cardene gtt, titrated off and now currently on home BP meds. Some progression in L sided weakness but overall stable. Patient alert, eating breakfast, reports getting a good night sleep.   OBJECTIVE Most recent Vital Signs: Filed Vitals:   06/04/13 0615 06/04/13 0630 06/04/13 0700 06/04/13 0740  BP:  127/67  168/86  Pulse: 59 58  71  Temp:   98.1 F (36.7 C)   TempSrc:   Oral   Resp: 17 17    Height:      Weight:      SpO2:       CBG (last 3)   Recent Labs  06/03/13 1225 06/03/13 1716 06/03/13 2205  GLUCAP 142* 156* 170*    IV Fluid Intake:   . sodium chloride 75 mL/hr at 06/04/13 0507  . niCARDipine Stopped (06/03/13 0541)  . niCARDipine Stopped (06/04/13 [id number])    MEDICATIONS  . sodium chloride  250 mL Intravenous Once  . amLODipine  5 mg Oral Daily  . amoxicillin  500 mg Oral Q8H  . carvedilol  12.5 mg Oral BID WC  . cloNIDine  0.3 mg Oral BID  . glyBURIDE  5 mg Oral Q breakfast  . insulin aspart  0-15 Units Subcutaneous TID WC  . lisinopril  40 mg Oral Daily  . metFORMIN  1,500 mg Oral Q breakfast  . pantoprazole (PROTONIX) IV  40 mg Intravenous QHS   PRN:  acetaminophen, acetaminophen, hydrALAZINE, labetalol, senna-docusate  Diet:  Dysphagia  Activity:  Bedrest DVT Prophylaxis:  SCDs    CLINICALLY SIGNIFICANT STUDIES Basic Metabolic Panel:   Recent Labs Lab 06/03/13 0355 06/03/13 0400  NA 140 140  K 3.5* 3.4*  CL 102 103  CO2 25  --   GLUCOSE 126* 125*  BUN 14 13  CREATININE 0.88 1.10  CALCIUM 8.5  --    Liver Function Tests:   Recent Labs Lab 06/03/13 0355  AST 14  ALT 23  ALKPHOS 64  BILITOT 0.6  PROT 6.9  ALBUMIN 3.7   CBC:   Recent Labs Lab 06/03/13 0355 06/03/13 0400  WBC 7.8  --   NEUTROABS 3.9  --   HGB 14.1 14.3  HCT 39.2 42.0  MCV 92.9  --   PLT 172  --    Coagulation:   Recent Labs Lab 06/03/13 0355  LABPROT 13.2  INR 1.02   Cardiac Enzymes:   Recent Labs Lab 06/03/13 0355  TROPONINI <0.30   Urinalysis:   Recent Labs Lab 06/03/13 0605  COLORURINE YELLOW  LABSPEC 1.024  PHURINE 7.0  GLUCOSEU 250*  HGBUR SMALL*  BILIRUBINUR NEGATIVE  KETONESUR 15*  PROTEINUR NEGATIVE  UROBILINOGEN 0.2  NITRITE NEGATIVE  LEUKOCYTESUR NEGATIVE   Lipid Panel    Component Value  Date/Time   CHOL 145 06/03/2013 0455   TRIG 163* 06/03/2013 0455   HDL 28* 06/03/2013 0455   CHOLHDL 5.2 06/03/2013 0455   VLDL 33 06/03/2013 0455   LDLCALC 84 06/03/2013 0455   HgbA1C  Lab Results  Component Value Date   HGBA1C 6.2* 06/03/2013    Urine Drug Screen:     Component Value Date/Time   LABOPIA NONE DETECTED 06/03/2013 0605   COCAINSCRNUR NONE DETECTED 06/03/2013 0605   LABBENZ NONE DETECTED 06/03/2013 0605   AMPHETMU NONE DETECTED 06/03/2013 0605   THCU NONE DETECTED 06/03/2013 0605   LABBARB NONE DETECTED 06/03/2013 0605    Alcohol Level:   Recent Labs Lab 06/03/13 0355  ETH <11    CT of the brain  06/03/2013   No acute intracranial abnormalities.    CT head (repeat) 06/04/2013 Stable size and distribution of evolving acute right MCA territory  infarct. No evidence of hemorrhagic conversion.   CT angio head  06/03/2013    1. Subtle attenuation of the distal right superior MCA territory branch vessels as compared to the left. No proximal  branch occlusion or high-grade flow-limiting stenosis identified within the right middle cerebral artery proximally. 2. Otherwise unremarkable CTA of the head without evidence of occlusion, high-grade stenosis, or aneurysm elsewhere within the brain.   CT angio neck1/07/2013    1. Normal CTA of the neck without evidence of high-grade stenosis, occlusion, or dissection. 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 Minimal  underlying petechial hemorrhage without lobar hematoma.  Left occipital encephalomalacia suggests remote small left posterior  cerebral artery territory infarct.  Minimal additional white matter changes suggest chronic small vessel  ischemic disease.  Acute on chronic paranasal sinusitis.   2D Echocardiogram   Left ventricle: The cavity size was normal. Wall thickness was increased in a pattern of mild LVH. Systolic function was normal. The estimated ejection fraction was in the range of 60% to 65%. Wall motion was normal; there were no regional wall motion abnormalities. Doppler parameters are consistent with abnormal left ventricular relaxation (grade 1 diastolic dysfunction). - Aortic valve: There was no stenosis. - Mitral valve: Mildly calcified annulus. Normal thickness leaflets . - Left atrium: The atrium was mildly dilated. - Right ventricle: The cavity size was normal. Systolic function was normal. - Right atrium: The atrium was mildly dilated.   CXR    EKG  Sinus rhythm. Prolonged PR interval. Incomplete RBBB and LAFB. Consider anterolateral infarct. Abnormal T, consider ischemia, lateral leads.   Therapy Recommendations pending  Physical Exam   General Examination:  HEENT- Normocephalic, no lesions, without obvious abnormality. Normal external eye and conjunctiva. Neck supple with no masses, nodes, nodules or enlargement.  Cardiovascular - S1, S2 normal  Lungs -  chest clear, no wheezing, rales, normal symmetric air entry  Abdomen - soft, non-tender; bowel sounds normal; no masses, no organomegaly, protuberant  Extremities - mild edema in the lower extremities bilaterally  Neurologic Examination:  Mental Status:  Lethargic, oriented to name/location/Jan 2015, thought content appropriate. Speech fluent but dysarthric. Able to follow 3 step commands without difficulty. Left neglect  Cranial Nerves:  II:  L inferior quadrantnopia, pupils equal, round, reactive to light III,IV, VI: ptosis not present, right gaze preference with patient able to voluntarily move eyes horizontally to the left. Vertical gaze intact.  V,VII: left facial droop, facial light touch sensation decreased on the  left  VIII: hearing normal bilaterally  IX,X: gag reflex reduced  XI: bilateral shoulder shrug  XII: midline tongue extension  Motor:  Right : Upper extremity 5/5 Left: Upper extremity 2/5  Lower extremity 5/5 Left Lower extremity 2/5  Tone and bulk:normal tone throughout; no atrophy noted  Sensory: absent LT on the left, neglect with abnormal 2 point discrimination Deep Tendon Reflexes: 2+ and symmetric with absent AJ's bilaterally  Plantars:  Right: downgoing Left: upgoing  Cerebellar:  normal finger-to-nose testing on the right and normal heel-to-shin testing bilaterally. Unable to assess on L due to weakness Gait: Unable to test    ASSESSMENT Mr. Donald [name] is a 68 y.o. male presenting with Difficulty with speech, left sided weakness. Status post IV t-PA 06/03/2013 at 0437.  Suspect a right brain infarct. Etiology unclear at this time. Would question possible embolic event. On aspirin 81 mg orally every day prior to admission. Now on no antithrombotics for secondary stroke prevention as recent TPA. Work up underway.  Hypertension, cardene for control in order to administer TPA. cardene now off. Diabetes, HgbA1c pending, goal < 7.0 Ischemic infarct s/p  TPA Sinusitis  Hospital day # 1  TREATMENT/PLAN  Start Plavix 75mg  daily for secondary stroke prevention as repeat head CT shows no hemorrhage. D/c carotid   Will consider TEE  LDL 84, currently NPO, no statin as LDL <100  PT/OT/SLP evaluation when medically stable  Transfer out of ICU today  This patient is critically ill and at significant risk of neurological worsening, death and care requires constant monitoring of vital signs, hemodynamics,respiratory and cardiac monitoring, neurological assessment, other specialists and medical decision making of high complexity. A total of 35 minutes was spent in patient care.     Jim Like, DO  Neurology-Stroke

## 2013-06-04 NOTE — Progress Notes (Signed)
PT Cancellation Note  Patient Details Name: Donald [name] MRN: [id number] DOB: 01/02/1946   Cancelled Treatment:    Reason Eval/Treat Not Completed: Medical issues which prohibited therapy.  MD notes state to "keep in bed for now".  Will defer evaluation today.  Will follow for increased activity notes and evaluate at that time.  Thank you, Shanna Cisco, [place] 06/04/2013, 8:55 AM

## 2013-06-04 NOTE — Progress Notes (Signed)
Patient had unmeasured urine around 1600 after foley was taken out so bladder scan was done and it measured greater than 470mls. Straight cath was done with an output of 617mls. Encouraged him to drink a lot of fluids.

## 2013-06-05 DIAGNOSIS — I6350 Cerebral infarction due to unspecified occlusion or stenosis of unspecified cerebral artery: Secondary | ICD-10-CM | POA: Diagnosis not present

## 2013-06-05 LAB — GLUCOSE, CAPILLARY
Glucose-Capillary: 133 mg/dL — ABNORMAL HIGH (ref 70–99)
Glucose-Capillary: 153 mg/dL — ABNORMAL HIGH (ref 70–99)
Glucose-Capillary: 110 mg/dL — ABNORMAL HIGH (ref 70–99)
Glucose-Capillary: 100 mg/dL — ABNORMAL HIGH (ref 70–99)

## 2013-06-05 MED ORDER — AMLODIPINE BESYLATE 10 MG PO TABS
10.0000 mg | ORAL_TABLET | Freq: Every day | ORAL | Status: DC
  Administered 2013-06-06 – 2013-06-07 (×2): 10 mg via ORAL
  Filled 2013-06-05 (×2): qty 1

## 2013-06-05 NOTE — Progress Notes (Signed)
Stroke Team Progress Note  HISTORY Donald [name] is an 68 y.o. male who is visiting here for the holidays. Awakened this morning 06/03/2013 to help his grandchildren to bed. When he got up he was normal. As he was returning to bed began to be short of breath. Patient then was noted to have slurred speech by his wife and she noted a facial droop. Patient seemed to be weak on the left a well. EMS was called and the patient was brought in as a code stroke. Initial NIHSS of 8. Patient was given TPA. No intervenable lesion was seen on CTA. He was admitted to the neuro ICU for further evaluation and treatment.  SUBJECTIVE Per RN, no overnight events. BP stable with slight increase to SBP 180s for brief time. Resting comfortably. Family at bedside. Discussed current plan with family.   OBJECTIVE Most recent Vital Signs: Filed Vitals:   06/04/13 1759 06/04/13 2247 06/05/13 0220 06/05/13 0608  BP: 166/80 183/82 156/112 157/74  Pulse: 71 79  70  Temp: 98.5 F (36.9 C) 98.1 F (36.7 C) 98.8 F (37.1 C) 98 F (36.7 C)  TempSrc: Oral Oral Oral Oral  Resp: 20 18 18 20   Height:      Weight:      SpO2: 100% 100% 0% 99%   CBG (last 3)   Recent Labs  06/04/13 1627 06/04/13 2246 06/05/13 0624  GLUCAP 97 117* 133*    IV Fluid Intake:   . sodium chloride 75 mL/hr at 06/04/13 0507    MEDICATIONS  . amLODipine  5 mg Oral Daily  . amoxicillin  500 mg Oral Q8H  . carvedilol  12.5 mg Oral BID WC  . cloNIDine  0.3 mg Oral BID  . clopidogrel  75 mg Oral Q breakfast  . glyBURIDE  5 mg Oral Q breakfast  . insulin aspart  0-15 Units Subcutaneous TID WC  . lisinopril  40 mg Oral Daily  . metFORMIN  1,500 mg Oral Q breakfast  . pantoprazole (PROTONIX) IV  40 mg Intravenous QHS   PRN:  acetaminophen, acetaminophen, hydrALAZINE, labetalol, senna-docusate  Diet:  Dysphagia  Activity:  Bedrest DVT Prophylaxis:  SCDs   CLINICALLY SIGNIFICANT STUDIES Basic Metabolic Panel:   Recent Labs Lab  06/03/13 0355 06/03/13 0400  NA 140 140  K 3.5* 3.4*  CL 102 103  CO2 25  --   GLUCOSE 126* 125*  BUN 14 13  CREATININE 0.88 1.10  CALCIUM 8.5  --    Liver Function Tests:   Recent Labs Lab 06/03/13 0355  AST 14  ALT 23  ALKPHOS 64  BILITOT 0.6  PROT 6.9  ALBUMIN 3.7   CBC:   Recent Labs Lab 06/03/13 0355 06/03/13 0400  WBC 7.8  --   NEUTROABS 3.9  --   HGB 14.1 14.3  HCT 39.2 42.0  MCV 92.9  --   PLT 172  --    Coagulation:   Recent Labs Lab 06/03/13 0355  LABPROT 13.2  INR 1.02   Cardiac Enzymes:   Recent Labs Lab 06/03/13 0355  TROPONINI <0.30   Urinalysis:   Recent Labs Lab 06/03/13 0605  COLORURINE YELLOW  LABSPEC 1.024  PHURINE 7.0  GLUCOSEU 250*  HGBUR SMALL*  BILIRUBINUR NEGATIVE  KETONESUR 15*  PROTEINUR NEGATIVE  UROBILINOGEN 0.2  NITRITE NEGATIVE  LEUKOCYTESUR NEGATIVE   Lipid Panel    Component Value Date/Time   CHOL 145 06/03/2013 0455   TRIG 163* 06/03/2013 0455   HDL 28*  06/03/2013 0455   CHOLHDL 5.2 06/03/2013 0455   VLDL 33 06/03/2013 0455   LDLCALC 84 06/03/2013 0455   HgbA1C  Lab Results  Component Value Date   HGBA1C 6.2* 06/03/2013    Urine Drug Screen:     Component Value Date/Time   LABOPIA NONE DETECTED 06/03/2013 0605   COCAINSCRNUR NONE DETECTED 06/03/2013 0605   LABBENZ NONE DETECTED 06/03/2013 0605   AMPHETMU NONE DETECTED 06/03/2013 0605   THCU NONE DETECTED 06/03/2013 0605   LABBARB NONE DETECTED 06/03/2013 0605    Alcohol Level:   Recent Labs Lab 06/03/13 0355  ETH <11    CT of the brain  06/03/2013   No acute intracranial abnormalities.    CT head (repeat) 06/04/2013 Stable size and distribution of evolving acute right MCA territory  infarct. No evidence of hemorrhagic conversion.   CT angio head  06/03/2013    1. Subtle attenuation of the distal right superior MCA territory branch vessels as compared to the left. No proximal branch occlusion or high-grade flow-limiting stenosis identified within the right  middle cerebral artery proximally. 2. Otherwise unremarkable CTA of the head without evidence of occlusion, high-grade stenosis, or aneurysm elsewhere within the brain.   CT angio neck1/07/2013    1. Normal CTA of the neck without evidence of high-grade stenosis, occlusion, or dissection. 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 Minimal  underlying petechial hemorrhage without lobar hematoma.  Left occipital encephalomalacia suggests remote small left posterior  cerebral artery territory infarct.  Minimal additional white matter changes suggest chronic small vessel  ischemic disease.  Acute on chronic paranasal sinusitis.   2D Echocardiogram   Left ventricle: The cavity size was normal. Wall thickness was increased in a pattern of mild LVH. Systolic function was normal. The estimated ejection fraction was in the range of 60% to 65%. Wall motion was normal; there were no regional wall motion abnormalities. Doppler parameters are consistent with abnormal left ventricular relaxation (grade 1 diastolic dysfunction). - Aortic valve: There was no stenosis. - Mitral valve: Mildly calcified annulus. Normal thickness leaflets . - Left atrium: The atrium was mildly dilated. - Right ventricle: The cavity size was normal. Systolic function was normal. - Right atrium: The atrium was mildly dilated.   CXR    EKG  Sinus rhythm. Prolonged PR interval. Incomplete RBBB and LAFB. Consider anterolateral infarct. Abnormal T, consider ischemia, lateral leads.   Therapy Recommendations pending  Physical Exam   General Examination:  HEENT- Normocephalic, no lesions, without obvious abnormality. Normal external eye and conjunctiva. Neck supple with no masses, nodes, nodules or enlargement.  Cardiovascular - S1, S2 normal  Lungs - chest clear, no wheezing, rales, normal symmetric air entry  Abdomen - soft,  non-tender; bowel sounds normal; no masses, no organomegaly, protuberant  Extremities - mild edema in the lower extremities bilaterally  Neurologic Examination:  Mental Status:  Lethargic, oriented to name/location/Jan 2015, thought content appropriate. Speech fluent but dysarthric. Able to follow 3 step commands without difficulty. Left neglect  Cranial Nerves:  II:  L inferior quadrantnopia, pupils equal, round, reactive to light III,IV, VI: ptosis not present, right gaze preference with patient able to voluntarily move eyes horizontally to the left. Vertical gaze intact.  V,VII: left facial droop, facial light touch sensation decreased on the left  VIII: hearing normal bilaterally  IX,X: gag reflex reduced  XI: bilateral shoulder shrug  XII: midline tongue extension  Motor:  Right : Upper extremity 5/5 Left: Upper extremity 2/5  Lower extremity 5/5 Left Lower extremity 2/5  Tone and bulk:normal tone throughout; no atrophy noted  Sensory: absent LT on the left, neglect with abnormal 2 point discrimination Deep Tendon Reflexes: 2+ and symmetric with absent AJ's bilaterally  Plantars:  Right: downgoing Left: upgoing  Cerebellar:  normal finger-to-nose testing on the right and normal heel-to-shin testing bilaterally. Unable to assess on L due to weakness Gait: Unable to test    ASSESSMENT Mr. Donald [name] is a 68 y.o. male presenting with Difficulty with speech, left sided weakness. Status post IV t-PA 06/03/2013 at 0437.  Suspect a right brain infarct. Etiology unclear at this time but suspect embolic.On aspirin 81 mg orally every day prior to admission. Now on Plavix for secondary stroke prevention. Work up underway.  Hypertension Diabetes, HgbA1c pending, goal < 7.0 Ischemic infarct s/p TPA Sinusitis  Hospital day # 2  TREATMENT/PLAN  Start Plavix 75mg  daily for secondary stroke prevention as repeat head CT shows no hemorrhage.   Discussed with cardiology, scheduled for  TEE on Monday 1/05. NPO at midnight  LDL 84, currently NPO, no statin as LDL <100  PT/OT/SLP evaluation    Jim Like, DO  Neurology-Stroke

## 2013-06-05 NOTE — Evaluation (Signed)
Physical Therapy Evaluation Patient Details Name: Donald [name] MRN: [id number] DOB: April 03, 1946 Today's Date: 06/05/2013 Time: 2440-1027 PT Time Calculation (min): 32 min  PT Assessment / Plan / Recommendation History of Present Illness  Donald [name] is an 68 y.o. male who is visiting here for the holidays. Awakened this morning 06/03/2013 to help his grandchildren to bed. When he got up he was normal. As he was returning to bed began to be short of breath. Patient then was noted to have slurred speech by his wife and she noted a facial droop. Patient seemed to be weak on the left a well. EMS was called and the patient was brought in as a code stroke. Initial NIHSS of 8. Patient was given TPA. No intervenable lesion was seen on CTA.   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  Clinical Impression  Patient presents with problems listed below.  Will benefit from acute PT to maximize independence prior to discharge. Patient was independent pta.  Now requires +2 assist for mobility.  Recommend Inpatient Rehab consult for comprehensive therapies to reach maximum functional independence.    PT Assessment  Patient needs continued PT services    Follow Up Recommendations  CIR    Does the patient have the potential to tolerate intense rehabilitation      Barriers to Discharge        Equipment Recommendations  Wheelchair (measurements PT);Wheelchair cushion (measurements PT);3in1 (PT)    Recommendations for Other Services Rehab consult   Frequency Min 4X/week    Precautions / Restrictions Precautions Precautions: Fall Restrictions Weight Bearing Restrictions: No   Pertinent Vitals/Pain       Mobility  Bed Mobility Bed Mobility: Rolling Right;Right Sidelying to Sit;Sitting - Scoot to [employer] of Bed;Sit to Sidelying Right Rolling Right: 2: Max assist;With rail Right Sidelying to Sit: 2: Max assist;HOB elevated Sitting - Scoot to [employer] of Bed: 2: Max  assist;With rail Sit to Sidelying Right: 3: Mod assist;HOB flat Details for Bed Mobility Assistance: Verbal cues for technique and sequence.  Assist to manage LUE/LLE during mobility.  Once sitting, patient initially with poor balance, leaning to left and posteriorly.  Worked in sitting on midline upright posture.  At end of session, patient able to maintain upright posture using RUE on rail with min guard x 20 seconds.  Attempted to work on shifting weight forward over feet to unweight hips.  Immediately returned to leaning to left side. Transfers Transfers: Not assessed Modified Rankin (Stroke Patients Only) Pre-Morbid Rankin Score: No symptoms Modified Rankin: Severe disability    Exercises     PT Diagnosis: Difficulty walking;Acute pain;Hemiplegia non-dominant side  PT Problem List: Decreased strength;Decreased activity tolerance;Decreased balance;Decreased mobility;Decreased knowledge of use of DME;Decreased knowledge of precautions;Impaired sensation;Obesity;Pain PT Treatment Interventions: DME instruction;Functional mobility training;Balance training;Neuromuscular re-education;Patient/family education     PT Goals(Current goals can be found in the care plan section) Acute Rehab PT Goals Patient Stated Goal: To get stronger.   PT Goal Formulation: With patient/family Time For Goal Achievement: 06/19/13 Potential to Achieve Goals: Good  Visit Information  Last PT Received On: 06/05/13 Assistance Needed: +2 History of Present Illness: Donald [name] is an 68 y.o. male who is visiting here for the holidays. Awakened this morning 06/03/2013 to help his grandchildren to bed. When he got up he was normal. As he was returning to bed began to be short of breath. Patient then was noted to have slurred speech by his wife and she noted a facial  droop. Patient seemed to be weak on the left a well. EMS was called and the patient was brought in as a code stroke. Initial NIHSS of 8. Patient was  given TPA. No intervenable lesion was seen on CTA.   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       Prior [place] expects to be discharged to:: Inpatient rehab Living Arrangements: Spouse/significant other Available Help at Discharge: Family;Available 24 hours/day Type of Home: House Home Access: Stairs to enter [employer] of Steps: 2 Entrance Stairs-Rails: Right;Left Home Layout: Two level;Able to live on main level with bedroom/bathroom Home Equipment: Shower seat Prior Function Level of Independence: Independent Comments: Drives.  Has home office, works in [employer].  Some travel. Communication Communication: Expressive difficulties Dominant Hand: Right    Cognition  Cognition Arousal/Alertness: Awake/alert Behavior During Therapy: WFL for tasks assessed/performed Overall Cognitive Status: Within Functional Limits for tasks assessed    Extremity/Trunk Assessment Upper Extremity Assessment Upper Extremity Assessment: LUE deficits/detail LUE Deficits / Details: No movement noted LUE.  Noted redness and warmth in lower arm - RN notified. LUE Sensation: decreased light touch Lower Extremity Assessment Lower Extremity Assessment: LLE deficits/detail LLE Deficits / Details: No movement noted at hip/knee.  Noted slight toe movement. LLE Sensation: decreased light touch   Balance Balance Balance Assessed: Yes Static Sitting Balance Static Sitting - Balance Support: Right upper extremity supported;Feet supported Static Sitting - Level of Assistance: 3: Mod assist Static Sitting - Comment/# of Minutes: 15 minutes, working on midline upright posture.  Able to maintain posture using rail on Rt side x 20 seconds.  Attempted to initiate sit to stand by shifting weight forward over feet and lifting hips from bed x4.  On each attempt, patient returned to leaning to left.  End of Session PT - End of Session Activity  Tolerance: Patient limited by fatigue;Patient limited by pain Patient left: in bed;with call bell/phone within reach;with bed alarm set;with family/visitor present Nurse Communication: Mobility status;Need for lift equipment;Patient requests pain meds (Headache; LUE swelling, red, warm)  GP     Despina Pole 06/05/2013, 4:34 PM Carita Pian. Sanjuana Kava, [place] Pager [phone]

## 2013-06-06 DIAGNOSIS — I6330 Cerebral infarction due to thrombosis of unspecified cerebral artery: Secondary | ICD-10-CM

## 2013-06-06 DIAGNOSIS — I6350 Cerebral infarction due to unspecified occlusion or stenosis of unspecified cerebral artery: Secondary | ICD-10-CM | POA: Diagnosis not present

## 2013-06-06 LAB — GLUCOSE, CAPILLARY
Glucose-Capillary: 141 mg/dL — ABNORMAL HIGH (ref 70–99)
Glucose-Capillary: 139 mg/dL — ABNORMAL HIGH (ref 70–99)
Glucose-Capillary: 116 mg/dL — ABNORMAL HIGH (ref 70–99)
Glucose-Capillary: 134 mg/dL — ABNORMAL HIGH (ref 70–99)
Glucose-Capillary: 164 mg/dL — ABNORMAL HIGH (ref 70–99)

## 2013-06-06 NOTE — Progress Notes (Signed)
SLP Cancellation Note  Patient Details Name: Yaxiel Minnie MRN: [id number] DOB: [birth year]   Cancelled treatment:       Reason Eval/Treat Not Completed: Other (comment) (pt with rehab PA and NPO for procedure, will return for SLE and diet assessment as schedule allows)   Luanna Salk, [facility] SLP [phone]

## 2013-06-06 NOTE — Consult Note (Signed)
Physical Medicine and Rehabilitation Consult Reason for Consult: CVA  Referring Physician: Dr. Leonie Man   HPI: Donald [name] is a 68 y.o. right-handed male with history of hypertension as well as diabetes mellitus. Patient is from [place] he was visiting family in [place]. Admitted 06/03/2013 of left-sided weakness and slurred speech. 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 Echocardiogram with ejection fraction 78% grade 1 diastolic dysfunction. CT angiogram head and neck with no proximal branch occlusion or stenosis. Patient did receive TPA. Neurology services consulted placed on Plavix for CVA prophylaxis. TEE is pending. 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 Physical therapy evaluation completed 06/05/2013 with recommendations for physical medicine rehabilitation consult to consider inpatient rehabilitation services.   Review of Systems  Musculoskeletal: Positive for myalgias.  All other systems reviewed and are negative.   Past Medical History  Diagnosis Date  . Hypertension   . Diabetes mellitus without complication   . Hyperlipemia    History reviewed. No pertinent past surgical history. History reviewed. No pertinent family history. Social History:  reports that he has quit smoking. He has never used smokeless tobacco. He reports that he does not use illicit drugs. His alcohol history is not on file. Allergies: No Known Allergies Medications Prior to Admission  Medication Sig Dispense Refill  . amLODipine (NORVASC) 5 MG tablet Take 5 mg by mouth daily.      Marland Kitchen aspirin EC 81 MG tablet Take 81 mg by mouth daily.      . carvedilol (COREG) 12.5 MG tablet Take 12.5 mg by mouth 2 (two) times daily with a meal.      . cloNIDine (CATAPRES) 0.3 MG tablet Take 0.3 mg by mouth 2 (two)  times daily.      Marland Kitchen glyBURIDE (DIABETA) 5 MG tablet Take 5 mg by mouth daily with breakfast.      . lisinopril (PRINIVIL,ZESTRIL) 40 MG tablet Take 40 mg by mouth daily.      . metFORMIN (GLUCOPHAGE-XR) 750 MG 24 hr tablet Take 1,500 mg by mouth daily with breakfast.        Home: Home Living Family/patient expects to be discharged to:: Inpatient rehab Living Arrangements: Spouse/significant other Available Help at Discharge: Family;Available 24 hours/day Type of Home: House Home Access: Stairs to enter [employer] of Steps: 2 Entrance Stairs-Rails: Right;Left Home Layout: Two level;Able to live on main level with bedroom/bathroom Home Equipment: Shower seat  Functional History: Prior Function Comments: Drives.  Has home office, works in [employer].  Some travel. Functional Status:  Mobility: Bed Mobility Bed Mobility: Rolling Right;Right Sidelying to Sit;Sitting - Scoot to [employer] of Bed;Sit to Sidelying Right Rolling Right: 2: Max assist;With rail Right Sidelying to Sit: 2: Max assist;HOB elevated Sitting - Scoot to [employer] of Bed: 2: Max assist;With rail Sit to Sidelying Right: 3: Mod assist;HOB flat Transfers Transfers: Not assessed      ADL:    Cognition: Cognition Overall Cognitive Status: Within Functional Limits for tasks assessed Orientation Level: Oriented X4 Cognition Arousal/Alertness: Awake/alert Behavior During Therapy: WFL for tasks assessed/performed Overall Cognitive Status: Within Functional Limits for tasks assessed  Blood pressure 194/101, pulse 63, temperature 98.2 F (36.8 C), temperature source Oral, resp. rate 20, height 6\' 3"  (1.905 m), weight 127.2 kg (280 lb 6.8 oz), SpO2 98.00%. Physical Exam  Vitals reviewed. Constitutional: He is oriented to person,  place, and time.  HENT:  Head: Normocephalic.  Eyes:  Pupils round and reactive to light. He does have a right gaze preference  Neck: Normal range of motion. Neck supple. No thyromegaly  present.  Cardiovascular: Normal rate and regular rhythm.   Respiratory: Effort normal and breath sounds normal. No respiratory distress.  GI: Soft. Bowel sounds are normal. He exhibits no distension.  Neurological: He is alert and oriented to person, place, and time.  Follows full commands. Patient with fair awareness of deficits. Left central 7. Pockets food in left cheek while eating, doesn't recognize all food on outer lip.  Dense left sided weakness. LUE is trace at deltoid and bicep 0/5 distally. LLE is 0-tr/5 prox to distal. Has apparent intact pain and light touch sense. Somewhat distractible but reasonable insight and awareness.   Skin: Skin is warm and dry.  Psychiatric: He has a normal mood and affect. His behavior is normal.    Results for orders placed during the hospital encounter of 06/03/13 (from the past 24 hour(s))  GLUCOSE, CAPILLARY     Status: Abnormal   Collection Time    06/05/13 12:15 PM      Result Value Range   Glucose-Capillary 153 (*) 70 - 99 mg/dL  GLUCOSE, CAPILLARY     Status: Abnormal   Collection Time    06/05/13  4:37 PM      Result Value Range   Glucose-Capillary 110 (*) 70 - 99 mg/dL  GLUCOSE, CAPILLARY     Status: Abnormal   Collection Time    06/05/13  9:16 PM      Result Value Range   Glucose-Capillary 100 (*) 70 - 99 mg/dL  GLUCOSE, CAPILLARY     Status: Abnormal   Collection Time    06/06/13  6:55 AM      Result Value Range   Glucose-Capillary 141 (*) 70 - 99 mg/dL  GLUCOSE, CAPILLARY     Status: Abnormal   Collection Time    06/06/13  7:40 AM      Result Value Range   Glucose-Capillary 139 (*) 70 - 99 mg/dL   Comment 1 Documented in Chart     Comment 2 Notify RN     No results found.  Assessment/Plan: Diagnosis: Right MCA infarct 1. Does the need for close, 24 hr/day medical supervision in concert with the patient's rehab needs make it unreasonable for this patient to be served in a less intensive setting? Yes 2. Co-Morbidities  requiring supervision/potential complications: htn, dm 3. Due to bladder management, bowel management, safety, skin/wound care, disease management, medication administration, pain management and patient education, does the patient require 24 hr/day rehab nursing? Yes 4. Does the patient require coordinated care of a physician, rehab nurse, PT (1-2 hrs/day, 5 days/week), OT (1-2 hrs/day, 5 days/week) and SLP (1 hrs/day, 5 days/week) to address physical and functional deficits in the context of the above medical diagnosis(es)? Yes Addressing deficits in the following areas: balance, endurance, locomotion, strength, transferring, bowel/bladder control, bathing, dressing, feeding, grooming, toileting, cognition, swallowing and psychosocial support 5. Can the patient actively participate in an intensive therapy program of at least 3 hrs of therapy per day at least 5 days per week? Yes 6. The potential for patient to make measurable gains while on inpatient rehab is excellent 7. Anticipated functional outcomes upon discharge from inpatient rehab are min assist with PT, supervision to min assist with OT, mod I with SLP. 8. Estimated rehab length of stay to reach the  above functional goals is: 20-24 days 9. Does the patient have adequate social supports to accommodate these discharge functional goals? Yes 10. Anticipated D/C setting: Home 11. Anticipated post D/C treatments: Hope therapy 12. Overall Rehab/Functional Prognosis: excellent  RECOMMENDATIONS: This patient's condition is appropriate for continued rehabilitative care in the following setting: CIR Patient has agreed to participate in recommended program. Yes Note that insurance prior authorization may be required for reimbursement for recommended care.  Comment: Rehab RN to follow up. Dispo complicated somewhat by his imminent move from [place] to [place]. Wife is supportive and present but just had bilateral TKA's.    Meredith Staggers, MD,  Mell[facility] Drown     06/06/2013

## 2013-06-06 NOTE — Evaluation (Signed)
Occupational Therapy Evaluation Patient Details Name: Donald [name] MRN: [id number] DOB: Oct 19, 1945 Today's Date: 06/06/2013 Time: 1517-6160 OT Time Calculation (min): 40 min  OT Assessment / Plan / Recommendation History of present illness Donald [name] is an 68 y.o. male who is visiting here for the holidays. Awakened this morning 06/03/2013 to help his grandchildren to bed. When he got up he was normal. As he was returning to bed began to be short of breath. Patient then was noted to have slurred speech by his wife and she noted a facial droop. Patient seemed to be weak on the left a well. EMS was called and the patient was brought in as a code stroke. Initial NIHSS of 8. Patient was given TPA. No intervenable lesion was seen on CTA.   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   Clinical Impression   Pt presents with below problem list. Pt independent with ADLs, PTA. Feel pt will benefit from acute OT to increase independence. Recommending CIR for additional rehab. Pt very motivated to improve.     OT Assessment  Patient needs continued OT Services    Follow Up Recommendations  CIR;Supervision/Assistance - 24 hour    Barriers to Discharge      Equipment Recommendations  Other (comment) (defer to next venue)    Recommendations for Other Services Rehab consult  Frequency  Min 2X/week    Precautions / Restrictions Precautions Precautions: Fall Restrictions Weight Bearing Restrictions: No   Pertinent Vitals/Pain No pain reported.     ADL  Eating/Feeding: Set up Where Assessed - Eating/Feeding: Chair Grooming: Set up;Brushing hair Where Assessed - Grooming: Supported sitting Upper Body Dressing: Moderate assistance Where Assessed - Upper Body Dressing: Supported sitting Lower Body Dressing: Maximal assistance Where Assessed - Lower Body Dressing: Supine, head of bed up;Supine, head of bed flat;Rolling right and/or left Toilet Transfer: +2  Total assistance Toilet Transfer: Patient Percentage: 60% Toilet Transfer Method: Sit to Loss adjuster, chartered: Other (comment) (from bed; used stedy) Toileting - Clothing Manipulation and Hygiene: Maximal assistance Where Assessed - Toileting Clothing Manipulation and Hygiene: Supine, head of bed flat;Rolling right and/or left Tub/Shower Transfer Method: Not assessed Equipment Used: Gait belt;Other (comment) (stedy) Transfers/Ambulation Related to ADLs: +2 Total A ADL Comments: Educated on retrograde massage for left hand. Educated also on exercises of left hand by using right hand to assist to help with edema. Educated how weightbearing through LUE is beneficial. Had pt weighbearing and reaching for grooming objects. Educated to support left arm on pillow. Pt had left arm on table and used right hand to move left arm back and forth on table. Educated spouse on signs/symptoms of stroke and avoiding canned foods. Assist with hygiene in bed.    OT Diagnosis: Hemiplegia non-dominant side  OT Problem List: Decreased strength;Decreased coordination;Impaired balance (sitting and/or standing);Decreased knowledge of use of DME or AE;Decreased knowledge of precautions;Impaired UE functional use;Increased edema OT Treatment Interventions: Self-care/ADL training;Therapeutic exercise;Neuromuscular education;DME and/or AE instruction;Therapeutic activities;Patient/family education;Balance training   OT Goals(Current goals can be found in the care plan section) Acute Rehab OT Goals Patient Stated Goal: To get stronger.   OT Goal Formulation: With patient Time For Goal Achievement: 06/13/13 Potential to Achieve Goals: Good ADL Goals Pt Will Perform Upper Body Dressing: with min assist;sitting Pt Will Transfer to Toilet: with mod assist;stand pivot transfer;bedside commode Pt Will Perform Toileting - Clothing Manipulation and hygiene: with min assist;sitting/lateral leans Additional ADL Goal #1:  Pt will maintain sitting balance  sitting EOB, with Min A, while performing functional ADL task. Additional ADL Goal #2: Pt will be independent in safely positioning LUE as well as performing edema management techniques.  Visit Information  Last OT Received On: 06/06/13 Assistance Needed: +2 PT/OT/SLP Co-Evaluation/Treatment: Yes Reason for Co-Treatment: Complexity of the patient's impairments (multi-system involvement);For patient/therapist safety PT goals addressed during session: Mobility/safety with mobility;Balance;Strengthening/ROM OT goals addressed during session: ADL's and self-care;Strengthening/ROM History of Present Illness: Donald [name] is an 68 y.o. male who is visiting here for the holidays. Awakened this morning 06/03/2013 to help his grandchildren to bed. When he got up he was normal. As he was returning to bed began to be short of breath. Patient then was noted to have slurred speech by his wife and she noted a facial droop. Patient seemed to be weak on the left a well. EMS was called and the patient was brought in as a code stroke. Initial NIHSS of 8. Patient was given TPA. No intervenable lesion was seen on CTA.   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       Prior [place] expects to be discharged to:: Inpatient rehab Living Arrangements: Spouse/significant other Available Help at Discharge: Family;Available 24 hours/day Type of Home: House Home Access: Stairs to enter [employer] of Steps: 2 Entrance Stairs-Rails: Right;Left Home Layout: Two level;Able to live on main level with bedroom/bathroom Home Equipment: Bedside commode;Shower seat Additional Comments: pt has regular height commode and walk in shower. Prior Function Level of Independence: Independent Comments: Drives.  Has home office, works in [employer].  Some travel. Communication Communication: No difficulties Dominant Hand:  Right         Vision/Perception Vision - History Baseline Vision:  (wears glasses most of the time) Patient Visual Report: No change from baseline Vision - Assessment Vision Assessment: Vision not tested   Cognition  Cognition Arousal/Alertness: Awake/alert Behavior During Therapy: WFL for tasks assessed/performed Overall Cognitive Status: Within Functional Limits for tasks assessed    Extremity/Trunk Assessment Upper Extremity Assessment Upper Extremity Assessment: LUE deficits/detail LUE Deficits / Details: 2/5 elbow flexion/extension. Minimal finger movement. Some shoulder movement noted during session. LUE Coordination: decreased fine motor;decreased gross motor Lower Extremity Assessment Lower Extremity Assessment: Defer to PT evaluation     Mobility Bed Mobility Bed Mobility: Rolling Left;Rolling Right;Right Sidelying to Sit;Sitting - Scoot to [employer] of Bed Rolling Right: 3: Mod assist Rolling Left: 3: Mod assist Right Sidelying to Sit: 4: Min assist Sitting - Scoot to Edge of Bed: 3: Mod assist Details for Bed Mobility Assistance: VCs for positioning and technique, patient able to push through RUE, cues for attention to LUE during mobility Transfers Transfers: Sit to Stand;Stand to Sit Sit to Stand: 1: +2 Total assist;From bed Sit to Stand: Patient Percentage: 60% Stand to Sit: 1: +2 Total assist Stand to Sit: Patient Percentage: 60% Transfer via Lift Equipment: Stedy Details for Transfer Assistance: Max assist for L side lean and positioning, assist to hold LUE, VCs for position on lift equipment.         Balance Balance Balance Assessed: Yes Static Sitting Balance Static Sitting - Balance Support: Right upper extremity supported;Feet supported Static Sitting - Level of Assistance: 3: Mod assist Static Sitting - Comment/# of Minutes: left lateral lean during sitting mod to max assist    End of Session OT - End of Session Equipment Utilized During Treatment:  Gait belt;Other (comment) (stedy) Activity Tolerance: Patient tolerated  treatment well Patient left: in chair;with call bell/phone within reach;Other (comment) (with PT)  GO     Benito Mccreedy OTR/L [phone] 06/06/2013, 6:20 PM

## 2013-06-06 NOTE — Progress Notes (Signed)
Physical Therapy Treatment Patient Details Name: Donald [name] MRN: [id number] DOB: 11-29-1945 Today's Date: 06/06/2013 Time: 7096-2836 PT Time Calculation (min): 45 min  PT Assessment / Plan / Recommendation  History of Present Illness Donald [name] is an 68 y.o. male who is visiting here for the holidays. Awakened this morning 06/03/2013 to help his grandchildren to bed. When he got up he was normal. As he was returning to bed began to be short of breath. Patient then was noted to have slurred speech by his wife and she noted a facial droop. Patient seemed to be weak on the left a well. EMS was called and the patient was brought in as a code stroke. Initial NIHSS of 8. Patient was given TPA. No intervenable lesion was seen on CTA.   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   PT Comments   Patient demonstrates some improvements in activity tolerance and mobility today, less assist for bed mobility and patient able to show some AROM in LLE.  Educated patient on techniques for functional recovery. Discussed weight bearing through affected side, as well as bilateral performance of there ex and tasks to help facilitate neuro plasticity.  Explained expectations and provided activities to work on throughout the evening. Will continue to see and progress as tolerated. Continue to feel CIR is most appropriate course of continued therapy at this time.  Follow Up Recommendations  CIR           Equipment Recommendations  Wheelchair (measurements PT);Wheelchair cushion (measurements PT);3in1 (PT)    Recommendations for Other Services Rehab consult  Frequency Min 4X/week   Progress towards PT Goals Progress towards PT goals: Progressing toward goals  Plan Current plan remains appropriate    Precautions / Restrictions Precautions Precautions: Fall Restrictions Weight Bearing Restrictions: No   Pertinent Vitals/Pain No pain reported at this time    Mobility  Bed Mobility Bed Mobility: Rolling Right;Right Sidelying to Sit;Sitting - Scoot to [employer] of Bed Rolling Right: 3: Mod assist Right Sidelying to Sit: 4: Min assist Sitting - Scoot to Edge of Bed: 3: Mod assist Details for Bed Mobility Assistance: VCs for positioning and technique, patient able to push through RUE, cues for attention to LUE during mobility Transfers Transfers: Sit to Stand;Stand to Sit Sit to Stand: 1: +2 Total assist Sit to Stand: Patient Percentage: 60% Stand to Sit: 1: +2 Total assist Stand to Sit: Patient Percentage: 60% Transfer via Lift Equipment: Stedy Details for Transfer Assistance: Max assist for L side lean and positioning, assist to hold LUE, VCs for position on lift equipment.  Ambulation/Gait Ambulation/Gait Assistance: Not tested (comment)    Exercises Other Exercises Other Exercises: Weight shift and compression through LLE in sitting Other Exercises: Left toe taps and attempts for dorsiflexion. Other Exercises: Quad sets and ankle pumps bilaterally/ simultaneously Other Exercises: UE ther ex performed with occupational therapy    PT Goals (current goals can now be found in the care plan section) Acute Rehab PT Goals Patient Stated Goal: To get stronger.   PT Goal Formulation: With patient/family Time For Goal Achievement: 06/19/13 Potential to Achieve Goals: Good  Visit Information  Last PT Received On: 06/06/13 Assistance Needed: +2 PT/OT/SLP Co-Evaluation/Treatment: Yes Reason for Co-Treatment: Complexity of the patient's impairments (multi-system involvement);For patient/therapist safety PT goals addressed during session: Mobility/safety with mobility;Balance;Strengthening/ROM History of Present Illness: Donald [name] is an 68 y.o. male who is visiting here for the holidays. Awakened this morning 06/03/2013 to help his grandchildren to  bed. When he got up he was normal. As he was returning to bed began to be short of breath. Patient then was  noted to have slurred speech by his wife and she noted a facial droop. Patient seemed to be weak on the left a well. EMS was called and the patient was brought in as a code stroke. Initial NIHSS of 8. Patient was given TPA. No intervenable lesion was seen on CTA.   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    Subjective Data  Subjective: I am a very determined man Patient Stated Goal: To get stronger.     Cognition  Cognition Arousal/Alertness: Awake/alert Behavior During Therapy: WFL for tasks assessed/performed Overall Cognitive Status: Within Functional Limits for tasks assessed    Balance  Balance Balance Assessed: Yes Static Sitting Balance Static Sitting - Balance Support: Right upper extremity supported;Feet supported Static Sitting - Level of Assistance: 3: Mod assist Static Sitting - Comment/# of Minutes: left lateral lean during sitting mod to max assist   End of Session PT - End of Session Equipment Utilized During Treatment: Gait belt Activity Tolerance: Patient limited by fatigue;Patient limited by pain Patient left: in chair;with call bell/phone within reach Nurse Communication: Mobility status;Need for lift equipment;Patient requests pain meds (Headache; LUE swelling, red, warm)   GP     Duncan Dull 06/06/2013, 4:34 PM Alben Deeds, [place] DPT  [phone]

## 2013-06-06 NOTE — Progress Notes (Signed)
Stroke Team Progress Note  HISTORY Donald [name] is an 68 y.o. male who is visiting here for the holidays. Awakened this morning 06/03/2013 to help his grandchildren to bed. When he got up he was normal. As he was returning to bed began to be short of breath. Patient then was noted to have slurred speech by his wife and she noted a facial droop. Patient seemed to be weak on the left a well. EMS was called and the patient was brought in as a code stroke. Initial NIHSS of 8. Patient was given TPA. No intervenable lesion was seen on CTA. He was admitted to the neuro ICU for further evaluation and treatment.  SUBJECTIVE His wife is at the bedside. They anticipate a TEE today. He has been NPO since midnight.  OBJECTIVE Most recent Vital Signs: Filed Vitals:   06/05/13 2117 06/06/13 0150 06/06/13 0618 06/06/13 0926  BP: 182/92 160/80 173/90 194/101  Pulse: 64 58 59 63  Temp: 98.3 F (36.8 C) 98.4 F (36.9 C) 97.8 F (36.6 C) 98.2 F (36.8 C)  TempSrc: Oral Oral Oral Oral  Resp: 18 20 20 20   Height:      Weight:      SpO2: 99% 100% 97% 98%   CBG (last 3)   Recent Labs  06/05/13 2116 06/06/13 0655 06/06/13 0740  GLUCAP 100* 141* 139*    IV Fluid Intake:   . sodium chloride 75 mL/hr at 06/06/13 0530    MEDICATIONS  . amLODipine  10 mg Oral Daily  . amoxicillin  500 mg Oral Q8H  . carvedilol  12.5 mg Oral BID WC  . cloNIDine  0.3 mg Oral BID  . clopidogrel  75 mg Oral Q breakfast  . glyBURIDE  5 mg Oral Q breakfast  . insulin aspart  0-15 Units Subcutaneous TID WC  . lisinopril  40 mg Oral Daily  . metFORMIN  1,500 mg Oral Q breakfast  . pantoprazole (PROTONIX) IV  40 mg Intravenous QHS   PRN:  acetaminophen, acetaminophen, hydrALAZINE, labetalol, senna-docusate  Diet:  NPO  Activity:  OOB DVT Prophylaxis:  SCDs   CLINICALLY SIGNIFICANT STUDIES Basic Metabolic Panel:   Recent Labs Lab 06/03/13 0355 06/03/13 0400  NA 140 140  K 3.5* 3.4*  CL 102 103  CO2 25   --   GLUCOSE 126* 125*  BUN 14 13  CREATININE 0.88 1.10  CALCIUM 8.5  --    Liver Function Tests:   Recent Labs Lab 06/03/13 0355  AST 14  ALT 23  ALKPHOS 64  BILITOT 0.6  PROT 6.9  ALBUMIN 3.7   CBC:   Recent Labs Lab 06/03/13 0355 06/03/13 0400  WBC 7.8  --   NEUTROABS 3.9  --   HGB 14.1 14.3  HCT 39.2 42.0  MCV 92.9  --   PLT 172  --    Coagulation:   Recent Labs Lab 06/03/13 0355  LABPROT 13.2  INR 1.02   Cardiac Enzymes:   Recent Labs Lab 06/03/13 0355  TROPONINI <0.30   Urinalysis:   Recent Labs Lab 06/03/13 0605  COLORURINE YELLOW  LABSPEC 1.024  PHURINE 7.0  GLUCOSEU 250*  HGBUR SMALL*  BILIRUBINUR NEGATIVE  KETONESUR 15*  PROTEINUR NEGATIVE  UROBILINOGEN 0.2  NITRITE NEGATIVE  LEUKOCYTESUR NEGATIVE   Lipid Panel    Component Value Date/Time   CHOL 145 06/03/2013 0455   TRIG 163* 06/03/2013 0455   HDL 28* 06/03/2013 0455   CHOLHDL 5.2 06/03/2013 0455  VLDL 33 06/03/2013 0455   LDLCALC 84 06/03/2013 0455   HgbA1C  Lab Results  Component Value Date   HGBA1C 6.2* 06/03/2013    Urine Drug Screen:     Component Value Date/Time   LABOPIA NONE DETECTED 06/03/2013 0605   COCAINSCRNUR NONE DETECTED 06/03/2013 0605   LABBENZ NONE DETECTED 06/03/2013 0605   AMPHETMU NONE DETECTED 06/03/2013 0605   THCU NONE DETECTED 06/03/2013 0605   LABBARB NONE DETECTED 06/03/2013 0605    Alcohol Level:   Recent Labs Lab 06/03/13 0355  ETH <11    CT of the brain   06/04/2013 Stable size and distribution of evolving acute right MCA territory infarct. No evidence of hemorrhagic conversion. 06/03/2013   No acute intracranial abnormalities.    CT angio head  06/03/2013    1. Subtle attenuation of the distal right superior MCA territory branch vessels as compared to the left. No proximal branch occlusion or high-grade flow-limiting stenosis identified within the right middle cerebral artery proximally. 2. Otherwise unremarkable CTA of the head without evidence of  occlusion, high-grade stenosis, or aneurysm elsewhere within the brain.   CT angio neck  06/03/2013   1. Normal CTA of the neck without evidence of high-grade stenosis, occlusion, or dissection. 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 Minimal underlying petechial hemorrhage without lobar hematoma. 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 Minimal additional white matter changes suggest chronic small vessel ischemic disease. Acute on chronic paranasal sinusitis.  2D Echocardiogram  EF 55-60% with no source of embolus.   EKG  Sinus rhythm. Prolonged PR interval. Incomplete RBBB and LAFB. Consider anterolateral infarct. Abnormal T, consider ischemia, lateral leads.   Therapy Recommendations CIR  Physical Exam   General Examination:  HEENT- Normocephalic, no lesions, without obvious abnormality. Normal external eye and conjunctiva. Neck supple with no masses, nodes, nodules or enlargement.  Cardiovascular - S1, S2 normal  Lungs - chest clear, no wheezing, rales, normal symmetric air entry  Abdomen - soft, non-tender; bowel sounds normal; no masses, no organomegaly, protuberant  Extremities - mild edema in the lower extremities bilaterally  Neurologic Examination:  Mental Status:  Lethargic, oriented to name/location/Jan 2015, thought content appropriate. Speech fluent but dysarthric. Able to follow 3 step commands without difficulty. Left neglect  Cranial Nerves:  II:  L inferior quadrantnopia, pupils equal, round, reactive to light III,IV, VI: ptosis not present, right gaze preference with patient able to voluntarily move eyes horizontally to the left. Vertical gaze intact.  V,VII: left facial droop, facial light touch sensation decreased on the left  VIII: hearing normal bilaterally  IX,X: gag reflex reduced  XI: bilateral shoulder shrug  XII:  midline tongue extension  Motor:  Right : Upper extremity 5/5 Left: Upper extremity 2/5  Lower extremity 5/5 Left Lower extremity 2/5  Tone and bulk:normal tone throughout; no atrophy noted  Sensory: absent LT on the left, neglect with abnormal 2 point discrimination Deep Tendon Reflexes: 2+ and symmetric with absent AJ's bilaterally  Plantars:  Right: downgoing Left: upgoing  Cerebellar:  normal finger-to-nose testing on the right and normal heel-to-shin testing bilaterally. Unable to assess on L due to weakness Gait: Unable to test    ASSESSMENT Mr. Donald [name] is a 68 y.o. male presenting with difficulty with speech, left sided weakness. Status post IV t-PA 06/03/2013 at 0437. 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  Infarct embolic secondary to unknown source. On aspirin 81 mg orally every day prior to admission. Now on plavix 75 mg daily for secondary stroke prevention.  Hypertension, cardene for control in order to administer TPA. cardene now off. Diabetes, HgbA1c 6.2, goal < 7.0 Sinusitis small vessel ischemic cerebral disease  Patient is not on the TEE schedule for today.  Hospital day # 3  TREATMENT/PLAN  Continue Plavix 75mg  daily for secondary stroke prevention   Place patient back on telemetry TEE to look for embolic source. Arranged with Nadine for tomorrow.  If positive for PFO (patent foramen ovale), check bilateral lower extremity venous dopplers to rule out DVT as possible source of stroke. (I have made patient NPO after midnight tonight). If TEE negative, a [facility] electrophysiologist will consult and place implantable loop recorder to evaluate for atrial fibrillation as etiology of stroke. This has been explained to patient/family by Dr. Leonie Man and they are agreeable.   Rehab consult in  place  [facility], MSN, RN, ANVP-BC, ANP-BC, [facility] Pager: [phone] 06/06/2013 3:21 PM  I have personally obtained a history, examined the patient, evaluated imaging results, and formulated the assessment and plan of care. I agree with the above.  Antony Contras, Md

## 2013-06-06 NOTE — Progress Notes (Signed)
I will follow up with pt and family to discuss rehab venue options once medical workup complete. Noted TEE pending. [phone]

## 2013-06-06 NOTE — Progress Notes (Signed)
Rehab Admissions Coordinator Note:  Patient was screened by Retta Diones for appropriateness for an Inpatient Acute Rehab Consult.  At this time, we are recommending Inpatient Rehab consult.  Retta Diones 06/06/2013, 9:31 AM  I can be reached at [phone].

## 2013-06-07 ENCOUNTER — Encounter (HOSPITAL_COMMUNITY)
Admission: EM | Disposition: A | Discharge status: Rehabilitation Facility | Payer: Self-pay | Source: Home / Self Care | Attending: Neurology

## 2013-06-07 ENCOUNTER — Inpatient Hospital Stay (HOSPITAL_COMMUNITY)
Admission: RE | Admit: 2013-06-07 | Discharge: 2013-07-01 | DRG: 945 | Disposition: A | Discharge status: Home Health | Payer: Medicare Other | Source: Intra-hospital | Attending: Physical Medicine & Rehabilitation | Admitting: Physical Medicine & Rehabilitation

## 2013-06-07 ENCOUNTER — Encounter (HOSPITAL_COMMUNITY): Payer: Self-pay

## 2013-06-07 DIAGNOSIS — E119 Type 2 diabetes mellitus without complications: Secondary | ICD-10-CM | POA: Diagnosis not present

## 2013-06-07 DIAGNOSIS — Z87891 Personal history of nicotine dependence: Secondary | ICD-10-CM | POA: Diagnosis not present

## 2013-06-07 DIAGNOSIS — E669 Obesity, unspecified: Secondary | ICD-10-CM | POA: Diagnosis present

## 2013-06-07 DIAGNOSIS — Z713 Dietary counseling and surveillance: Secondary | ICD-10-CM

## 2013-06-07 DIAGNOSIS — G8110 Spastic hemiplegia affecting unspecified side: Secondary | ICD-10-CM | POA: Diagnosis not present

## 2013-06-07 DIAGNOSIS — I10 Essential (primary) hypertension: Secondary | ICD-10-CM | POA: Diagnosis present

## 2013-06-07 DIAGNOSIS — Z5189 Encounter for other specified aftercare: Principal | ICD-10-CM

## 2013-06-07 DIAGNOSIS — I739 Peripheral vascular disease, unspecified: Secondary | ICD-10-CM | POA: Diagnosis present

## 2013-06-07 DIAGNOSIS — E1149 Type 2 diabetes mellitus with other diabetic neurological complication: Secondary | ICD-10-CM | POA: Diagnosis present

## 2013-06-07 DIAGNOSIS — J329 Chronic sinusitis, unspecified: Secondary | ICD-10-CM | POA: Diagnosis present

## 2013-06-07 DIAGNOSIS — I6340 Cerebral infarction due to embolism of unspecified cerebral artery: Secondary | ICD-10-CM | POA: Diagnosis not present

## 2013-06-07 DIAGNOSIS — I6350 Cerebral infarction due to unspecified occlusion or stenosis of unspecified cerebral artery: Secondary | ICD-10-CM | POA: Diagnosis not present

## 2013-06-07 DIAGNOSIS — E785 Hyperlipidemia, unspecified: Secondary | ICD-10-CM | POA: Diagnosis present

## 2013-06-07 DIAGNOSIS — Z7982 Long term (current) use of aspirin: Secondary | ICD-10-CM

## 2013-06-07 DIAGNOSIS — Z79899 Other long term (current) drug therapy: Secondary | ICD-10-CM | POA: Diagnosis not present

## 2013-06-07 DIAGNOSIS — I6789 Other cerebrovascular disease: Secondary | ICD-10-CM

## 2013-06-07 DIAGNOSIS — E1142 Type 2 diabetes mellitus with diabetic polyneuropathy: Secondary | ICD-10-CM | POA: Diagnosis present

## 2013-06-07 DIAGNOSIS — I69991 Dysphagia following unspecified cerebrovascular disease: Secondary | ICD-10-CM | POA: Diagnosis not present

## 2013-06-07 DIAGNOSIS — I639 Cerebral infarction, unspecified: Secondary | ICD-10-CM

## 2013-06-07 HISTORY — PX: TEE WITHOUT CARDIOVERSION: SHX5443

## 2013-06-07 HISTORY — PX: LOOP RECORDER IMPLANT: SHX5954

## 2013-06-07 HISTORY — PX: LOOP RECORDER IMPLANT: SHX5477

## 2013-06-07 LAB — GLUCOSE, CAPILLARY
Glucose-Capillary: 143 mg/dL — ABNORMAL HIGH (ref 70–99)
Glucose-Capillary: 132 mg/dL — ABNORMAL HIGH (ref 70–99)
Glucose-Capillary: 133 mg/dL — ABNORMAL HIGH (ref 70–99)

## 2013-06-07 SURGERY — ECHOCARDIOGRAM, TRANSESOPHAGEAL
Anesthesia: Moderate Sedation

## 2013-06-07 SURGERY — LOOP RECORDER IMPLANT
Anesthesia: LOCAL

## 2013-06-07 MED ORDER — AMLODIPINE BESYLATE 10 MG PO TABS
10.0000 mg | ORAL_TABLET | Freq: Every day | ORAL | Status: DC
  Administered 2013-06-08 – 2013-07-01 (×24): 10 mg via ORAL
  Filled 2013-06-07 (×28): qty 1

## 2013-06-07 MED ORDER — MIDAZOLAM HCL 5 MG/ML IJ SOLN
INTRAMUSCULAR | Status: AC
  Filled 2013-06-07: qty 2

## 2013-06-07 MED ORDER — ACETAMINOPHEN 650 MG RE SUPP: 650.0000 mg | RECTAL | Status: DC | PRN

## 2013-06-07 MED ORDER — METFORMIN HCL ER 500 MG PO TB24
1500.0000 mg | ORAL_TABLET | Freq: Every day | ORAL | Status: DC
Start: 2013-06-08 — End: 2013-07-01
  Administered 2013-06-08 – 2013-07-01 (×24): 1500 mg via ORAL
  Filled 2013-06-07 (×5): qty 2
  Filled 2013-06-07: qty 3
  Filled 2013-06-07 (×4): qty 2
  Filled 2013-06-07: qty 3
  Filled 2013-06-07 (×5): qty 2
  Filled 2013-06-07: qty 3
  Filled 2013-06-07 (×13): qty 2

## 2013-06-07 MED ORDER — ONDANSETRON HCL 4 MG/2ML IJ SOLN: 4.0000 mg | Freq: Four times a day (QID) | INTRAMUSCULAR | Status: DC | PRN

## 2013-06-07 MED ORDER — ONDANSETRON HCL 4 MG PO TABS: 4.0000 mg | ORAL_TABLET | Freq: Four times a day (QID) | ORAL | Status: DC | PRN

## 2013-06-07 MED ORDER — PANTOPRAZOLE SODIUM 40 MG IV SOLR
40.0000 mg | Freq: Every day | INTRAVENOUS | Status: DC
  Filled 2013-06-07 (×2): qty 40

## 2013-06-07 MED ORDER — SORBITOL 70 % SOLN: 30.0000 mL | Freq: Every day | Status: DC | PRN

## 2013-06-07 MED ORDER — LIDOCAINE-EPINEPHRINE 1 %-1:100000 IJ SOLN
INTRAMUSCULAR | Status: AC
  Filled 2013-06-07: qty 1

## 2013-06-07 MED ORDER — CLONIDINE HCL 0.3 MG PO TABS
0.3000 mg | ORAL_TABLET | Freq: Two times a day (BID) | ORAL | Status: DC
  Administered 2013-06-07 – 2013-06-13 (×12): 0.3 mg via ORAL
  Filled 2013-06-07 (×14): qty 1

## 2013-06-07 MED ORDER — FENTANYL CITRATE 0.05 MG/ML IJ SOLN
INTRAMUSCULAR | Status: AC
  Filled 2013-06-07: qty 2

## 2013-06-07 MED ORDER — CLOPIDOGREL BISULFATE 75 MG PO TABS
75.0000 mg | ORAL_TABLET | Freq: Every day | ORAL | Status: DC
  Administered 2013-06-08 – 2013-07-01 (×24): 75 mg via ORAL
  Filled 2013-06-07 (×26): qty 1

## 2013-06-07 MED ORDER — LISINOPRIL 40 MG PO TABS
40.0000 mg | ORAL_TABLET | Freq: Every day | ORAL | Status: DC
  Administered 2013-06-08 – 2013-07-01 (×24): 40 mg via ORAL
  Filled 2013-06-07 (×26): qty 1

## 2013-06-07 MED ORDER — FENTANYL CITRATE 0.05 MG/ML IJ SOLN
INTRAMUSCULAR | Status: DC | PRN
  Administered 2013-06-07 (×2): 25 ug via INTRAVENOUS

## 2013-06-07 MED ORDER — CARVEDILOL 12.5 MG PO TABS
12.5000 mg | ORAL_TABLET | Freq: Two times a day (BID) | ORAL | Status: DC
  Administered 2013-06-08 – 2013-06-10 (×6): 12.5 mg via ORAL
  Filled 2013-06-07 (×7): qty 1

## 2013-06-07 MED ORDER — AMOXICILLIN 500 MG PO CAPS
500.0000 mg | ORAL_CAPSULE | Freq: Three times a day (TID) | ORAL | Status: DC
  Administered 2013-06-07 – 2013-06-12 (×14): 500 mg via ORAL
  Filled 2013-06-07 (×15): qty 1

## 2013-06-07 MED ORDER — ACETAMINOPHEN 325 MG PO TABS
650.0000 mg | ORAL_TABLET | ORAL | Status: DC | PRN
  Administered 2013-06-08 – 2013-06-30 (×27): 650 mg via ORAL
  Filled 2013-06-07 (×26): qty 2

## 2013-06-07 MED ORDER — MIDAZOLAM HCL 10 MG/2ML IJ SOLN
INTRAMUSCULAR | Status: DC | PRN
  Administered 2013-06-07: 1 mg via INTRAVENOUS
  Administered 2013-06-07 (×2): 2 mg via INTRAVENOUS

## 2013-06-07 MED ORDER — SODIUM CHLORIDE 0.9 % IV SOLN: INTRAVENOUS | Status: DC

## 2013-06-07 MED ORDER — SENNOSIDES-DOCUSATE SODIUM 8.6-50 MG PO TABS
1.0000 | ORAL_TABLET | Freq: Every evening | ORAL | Status: DC | PRN
  Filled 2013-06-07: qty 2

## 2013-06-07 MED ORDER — BUTAMBEN-TETRACAINE-BENZOCAINE 2-2-14 % EX AERO
INHALATION_SPRAY | CUTANEOUS | Status: DC | PRN
  Administered 2013-06-07: 2 via TOPICAL

## 2013-06-07 MED ORDER — INSULIN ASPART 100 UNIT/ML ~~LOC~~ SOLN
0.0000 [IU] | Freq: Three times a day (TID) | SUBCUTANEOUS | Status: DC
  Administered 2013-06-08 (×2): 2 [IU] via SUBCUTANEOUS
  Administered 2013-06-09: 3 [IU] via SUBCUTANEOUS
  Administered 2013-06-09: 2 [IU] via SUBCUTANEOUS
  Administered 2013-06-10: 3 [IU] via SUBCUTANEOUS
  Administered 2013-06-11: 2 [IU] via SUBCUTANEOUS
  Administered 2013-06-11: 3 [IU] via SUBCUTANEOUS
  Administered 2013-06-12 (×2): 2 [IU] via SUBCUTANEOUS
  Administered 2013-06-12: 3 [IU] via SUBCUTANEOUS
  Administered 2013-06-13: 2 [IU] via SUBCUTANEOUS
  Administered 2013-06-14: 3 [IU] via SUBCUTANEOUS
  Administered 2013-06-15 – 2013-06-20 (×7): 2 [IU] via SUBCUTANEOUS
  Administered 2013-06-21 – 2013-06-23 (×3): 3 [IU] via SUBCUTANEOUS
  Administered 2013-06-24 – 2013-06-25 (×3): 2 [IU] via SUBCUTANEOUS
  Administered 2013-06-25: 3 [IU] via SUBCUTANEOUS
  Administered 2013-06-26: 2 [IU] via SUBCUTANEOUS
  Administered 2013-06-27: 3 [IU] via SUBCUTANEOUS
  Administered 2013-06-28 – 2013-06-29 (×4): 2 [IU] via SUBCUTANEOUS

## 2013-06-07 MED ORDER — DIPHENHYDRAMINE HCL 50 MG/ML IJ SOLN
INTRAMUSCULAR | Status: AC
Start: 2013-06-07 — End: 2013-06-07
  Filled 2013-06-07: qty 1

## 2013-06-07 MED ORDER — GLYBURIDE 5 MG PO TABS
5.0000 mg | ORAL_TABLET | Freq: Every day | ORAL | Status: DC
  Administered 2013-06-08 – 2013-07-01 (×24): 5 mg via ORAL
  Filled 2013-06-07 (×26): qty 1

## 2013-06-07 NOTE — Interval H&P Note (Signed)
History and Physical Interval Note:  06/07/2013 12:08 PM  Donald [name]  has presented today for surgery, with the diagnosis of stroke  The various methods of treatment have been discussed with the patient and family. After consideration of risks, benefits and other options for treatment, the patient has consented to  Procedure(s): TRANSESOPHAGEAL ECHOCARDIOGRAM (TEE) (N/A) as a surgical intervention .  The patient's history has been reviewed, patient examined, no change in status, stable for surgery.  I have reviewed the patient's chart and labs.  Questions were answered to the patient's satisfaction.     [name], H

## 2013-06-07 NOTE — Progress Notes (Signed)
*   Echocardiogram Echocardiogram Transesophageal has been performed.  Basilia Jumbo 06/07/2013, 12:56 PM

## 2013-06-07 NOTE — H&P (View-Only) (Signed)
Stroke Team Progress Note  HISTORY Donald [name] is an 68 y.o. male who is visiting here for the holidays. Awakened this morning 06/03/2013 to help his grandchildren to bed. When he got up he was normal. As he was returning to bed began to be short of breath. Patient then was noted to have slurred speech by his wife and she noted a facial droop. Patient seemed to be weak on the left a well. EMS was called and the patient was brought in as a code stroke. Initial NIHSS of 8. Patient was given TPA. No intervenable lesion was seen on CTA. He was admitted to the neuro ICU for further evaluation and treatment.  SUBJECTIVE His wife is at the bedside. They anticipate a TEE today. He has been NPO since midnight.  OBJECTIVE Most recent Vital Signs: Filed Vitals:   06/05/13 2117 06/06/13 0150 06/06/13 0618 06/06/13 0926  BP: 182/92 160/80 173/90 194/101  Pulse: 64 58 59 63  Temp: 98.3 F (36.8 C) 98.4 F (36.9 C) 97.8 F (36.6 C) 98.2 F (36.8 C)  TempSrc: Oral Oral Oral Oral  Resp: 18 20 20 20   Height:      Weight:      SpO2: 99% 100% 97% 98%   CBG (last 3)   Recent Labs  06/05/13 2116 06/06/13 0655 06/06/13 0740  GLUCAP 100* 141* 139*    IV Fluid Intake:   . sodium chloride 75 mL/hr at 06/06/13 0530    MEDICATIONS  . amLODipine  10 mg Oral Daily  . amoxicillin  500 mg Oral Q8H  . carvedilol  12.5 mg Oral BID WC  . cloNIDine  0.3 mg Oral BID  . clopidogrel  75 mg Oral Q breakfast  . glyBURIDE  5 mg Oral Q breakfast  . insulin aspart  0-15 Units Subcutaneous TID WC  . lisinopril  40 mg Oral Daily  . metFORMIN  1,500 mg Oral Q breakfast  . pantoprazole (PROTONIX) IV  40 mg Intravenous QHS   PRN:  acetaminophen, acetaminophen, hydrALAZINE, labetalol, senna-docusate  Diet:  NPO  Activity:  OOB DVT Prophylaxis:  SCDs   CLINICALLY SIGNIFICANT STUDIES Basic Metabolic Panel:   Recent Labs Lab 06/03/13 0355 06/03/13 0400  NA 140 140  K 3.5* 3.4*  CL 102 103  CO2 25   --   GLUCOSE 126* 125*  BUN 14 13  CREATININE 0.88 1.10  CALCIUM 8.5  --    Liver Function Tests:   Recent Labs Lab 06/03/13 0355  AST 14  ALT 23  ALKPHOS 64  BILITOT 0.6  PROT 6.9  ALBUMIN 3.7   CBC:   Recent Labs Lab 06/03/13 0355 06/03/13 0400  WBC 7.8  --   NEUTROABS 3.9  --   HGB 14.1 14.3  HCT 39.2 42.0  MCV 92.9  --   PLT 172  --    Coagulation:   Recent Labs Lab 06/03/13 0355  LABPROT 13.2  INR 1.02   Cardiac Enzymes:   Recent Labs Lab 06/03/13 0355  TROPONINI <0.30   Urinalysis:   Recent Labs Lab 06/03/13 0605  COLORURINE YELLOW  LABSPEC 1.024  PHURINE 7.0  GLUCOSEU 250*  HGBUR SMALL*  BILIRUBINUR NEGATIVE  KETONESUR 15*  PROTEINUR NEGATIVE  UROBILINOGEN 0.2  NITRITE NEGATIVE  LEUKOCYTESUR NEGATIVE   Lipid Panel    Component Value Date/Time   CHOL 145 06/03/2013 0455   TRIG 163* 06/03/2013 0455   HDL 28* 06/03/2013 0455   CHOLHDL 5.2 06/03/2013 0455  VLDL 33 06/03/2013 0455   LDLCALC 84 06/03/2013 0455   HgbA1C  Lab Results  Component Value Date   HGBA1C 6.2* 06/03/2013    Urine Drug Screen:     Component Value Date/Time   LABOPIA NONE DETECTED 06/03/2013 0605   COCAINSCRNUR NONE DETECTED 06/03/2013 0605   LABBENZ NONE DETECTED 06/03/2013 0605   AMPHETMU NONE DETECTED 06/03/2013 0605   THCU NONE DETECTED 06/03/2013 0605   LABBARB NONE DETECTED 06/03/2013 0605    Alcohol Level:   Recent Labs Lab 06/03/13 0355  ETH <11    CT of the brain   06/04/2013 Stable size and distribution of evolving acute right MCA territory infarct. No evidence of hemorrhagic conversion. 06/03/2013   No acute intracranial abnormalities.    CT angio head  06/03/2013    1. Subtle attenuation of the distal right superior MCA territory branch vessels as compared to the left. No proximal branch occlusion or high-grade flow-limiting stenosis identified within the right middle cerebral artery proximally. 2. Otherwise unremarkable CTA of the head without evidence of  occlusion, high-grade stenosis, or aneurysm elsewhere within the brain.   CT angio neck  06/03/2013   1. Normal CTA of the neck without evidence of high-grade stenosis, occlusion, or dissection. 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 Minimal underlying petechial hemorrhage without lobar hematoma. 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 Minimal additional white matter changes suggest chronic small vessel ischemic disease. Acute on chronic paranasal sinusitis.  2D Echocardiogram  EF 55-60% with no source of embolus.   EKG  Sinus rhythm. Prolonged PR interval. Incomplete RBBB and LAFB. Consider anterolateral infarct. Abnormal T, consider ischemia, lateral leads.   Therapy Recommendations CIR  Physical Exam   General Examination:  HEENT- Normocephalic, no lesions, without obvious abnormality. Normal external eye and conjunctiva. Neck supple with no masses, nodes, nodules or enlargement.  Cardiovascular - S1, S2 normal  Lungs - chest clear, no wheezing, rales, normal symmetric air entry  Abdomen - soft, non-tender; bowel sounds normal; no masses, no organomegaly, protuberant  Extremities - mild edema in the lower extremities bilaterally  Neurologic Examination:  Mental Status:  Lethargic, oriented to name/location/Jan 2015, thought content appropriate. Speech fluent but dysarthric. Able to follow 3 step commands without difficulty. Left neglect  Cranial Nerves:  II:  L inferior quadrantnopia, pupils equal, round, reactive to light III,IV, VI: ptosis not present, right gaze preference with patient able to voluntarily move eyes horizontally to the left. Vertical gaze intact.  V,VII: left facial droop, facial light touch sensation decreased on the left  VIII: hearing normal bilaterally  IX,X: gag reflex reduced  XI: bilateral shoulder shrug  XII:  midline tongue extension  Motor:  Right : Upper extremity 5/5 Left: Upper extremity 2/5  Lower extremity 5/5 Left Lower extremity 2/5  Tone and bulk:normal tone throughout; no atrophy noted  Sensory: absent LT on the left, neglect with abnormal 2 point discrimination Deep Tendon Reflexes: 2+ and symmetric with absent AJ's bilaterally  Plantars:  Right: downgoing Left: upgoing  Cerebellar:  normal finger-to-nose testing on the right and normal heel-to-shin testing bilaterally. Unable to assess on L due to weakness Gait: Unable to test    ASSESSMENT Mr. Donald [name] is a 68 y.o. male presenting with difficulty with speech, left sided weakness. Status post IV t-PA 06/03/2013 at 0437. 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  Infarct embolic secondary to unknown source. On aspirin 81 mg orally every day prior to admission. Now on plavix 75 mg daily for secondary stroke prevention.  Hypertension, cardene for control in order to administer TPA. cardene now off. Diabetes, HgbA1c 6.2, goal < 7.0 Sinusitis small vessel ischemic cerebral disease  Patient is not on the TEE schedule for today.  Hospital day # 3  TREATMENT/PLAN  Continue Plavix 75mg  daily for secondary stroke prevention   Place patient back on telemetry TEE to look for embolic source. Arranged with Nadine for tomorrow.  If positive for PFO (patent foramen ovale), check bilateral lower extremity venous dopplers to rule out DVT as possible source of stroke. (I have made patient NPO after midnight tonight). If TEE negative, a [place] electrophysiologist will consult and place implantable loop recorder to evaluate for atrial fibrillation as etiology of stroke. This has been explained to patient/family by Dr. Leonie Man and they are agreeable.   Rehab consult in  place  [facility], MSN, RN, ANVP-BC, ANP-BC, [facility] Pager: [phone] 06/06/2013 3:21 PM  I have personally obtained a history, examined the patient, evaluated imaging results, and formulated the assessment and plan of care. I agree with the above.  Antony Contras, Md

## 2013-06-07 NOTE — Consult Note (Signed)
ELECTROPHYSIOLOGY CONSULT NOTE    Patient ID: Donald [name] MRN: [id number], DOB/AGE: July 05, 1945 68 y.o.  Admit date: 06/03/2013 Date of Consult: 06-07-2013  Primary Physician: No PCP Per Patient Primary Cardiologist: new to [place]  Reason for Consultation: cryptogenic stroke - question ILR implant  HPI:  Mr. Donald [name] is a 69 year old male with a past medical history of diabetes, hypertension, hyperlipidemia.  He was admitted 06-03-13 with slurred speech and shortness of breath.  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  He was given tPA and admitted.  He has been monitored on telemetry which has demonstrated sinus rhythm.  Stroke work up was completed today with TEE.  EP has been asked to evaluate for placement of implantable loop recorder to look for atrial fibrillation as a cause for his stroke.     Echo this admission demonstrated an EF of 60-65%, no RWMA, grade 1 diastolic dysfunction, LA 51.   The patient and his wife currently live in [place], [place], but are moving to [place].  He denies chest pain, shortness of breath, palpitations, dizziness, or syncope. ROS is negative except as outlined above.  He is recovering from his stroke with plans to be admitted to inpatient rehab today.  Anticipated stay per the patient is 2-3 weeks.   Past Medical History  Diagnosis Date  . Hypertension   . Diabetes mellitus without complication   . Hyperlipemia      Surgical History: History reviewed. No pertinent past surgical history.   Prescriptions prior to admission  Medication Sig Dispense Refill  . amLODipine (NORVASC) 5 MG tablet Take 5 mg by mouth daily.      Marland Kitchen aspirin EC 81 MG tablet Take 81 mg by mouth daily.      . carvedilol (COREG) 12.5 MG tablet Take 12.5 mg by mouth 2 (two) times daily with a meal.      . cloNIDine (CATAPRES) 0.3 MG  tablet Take 0.3 mg by mouth 2 (two) times daily.      Marland Kitchen glyBURIDE (DIABETA) 5 MG tablet Take 5 mg by mouth daily with breakfast.      . lisinopril (PRINIVIL,ZESTRIL) 40 MG tablet Take 40 mg by mouth daily.      . metFORMIN (GLUCOPHAGE-XR) 750 MG 24 hr tablet Take 1,500 mg by mouth daily with breakfast.        Inpatient Medications:  . amLODipine  10 mg Oral Daily  . amoxicillin  500 mg Oral Q8H  . carvedilol  12.5 mg Oral BID WC  . cloNIDine  0.3 mg Oral BID  . clopidogrel  75 mg Oral Q breakfast  . glyBURIDE  5 mg Oral Q breakfast  . insulin aspart  0-15 Units Subcutaneous TID WC  . lisinopril  40 mg Oral Daily  . metFORMIN  1,500 mg Oral Q breakfast  . pantoprazole (PROTONIX) IV  40 mg Intravenous QHS    Allergies: No Known Allergies  History   Social History  . Marital Status: Married    Spouse Name: N/A    Number of Children: N/A  . Years of Education: N/A   Occupational History  . Not on file.   Social History Main Topics  . Smoking status: Former Research scientist (life sciences)  . Smokeless tobacco: Never Used  . Alcohol Use: Not on file  . Drug Use: No  . Sexual Activity: No   Other  Topics Concern  . Not on file   Social History Narrative  . No narrative on file     History reviewed. No pertinent family history.   Physical Exam: Filed Vitals:   06/07/13 1255 06/07/13 1300 06/07/13 1310 06/07/13 1434  BP: 197/92 172/109 186/80 179/92  Pulse: 65 64 65   Temp:      TempSrc:      Resp: 13 14 17    Height:      Weight:      SpO2: 97% 97% 97%     GEN- The patient is well appearing, alert with R sided weakness s/p stroke Head- normocephalic, atraumatic Eyes-  Sclera clear, conjunctiva pink Ears- hearing intact Oropharynx- clear Neck- supple, no JVP Lymph- no cervical lymphadenopathy Lungs- Clear to ausculation bilaterally, normal work of breathing Heart- Regular rate and rhythm, no murmurs, rubs or gallops, PMI not laterally displaced GI- soft, NT, ND, + BS Extremities-  no clubbing, cyanosis, or edema MS- no significant deformity or atrophy Skin- no rash or lesion    Labs:   Lab Results  Component Value Date   WBC 7.8 06/03/2013   HGB 14.3 06/03/2013   HCT 42.0 06/03/2013   MCV 92.9 06/03/2013   PLT 172 06/03/2013    Recent Labs Lab 06/03/13 0355 06/03/13 0400  NA 140 140  K 3.5* 3.4*  CL 102 103  CO2 25  --   BUN 14 13  CREATININE 0.88 1.10  CALCIUM 8.5  --   PROT 6.9  --   BILITOT 0.6  --   ALKPHOS 64  --   ALT 23  --   AST 14  --   GLUCOSE 126* 125*   Lab Results  Component Value Date   TROPONINI <0.30 06/03/2013   Lab Results  Component Value Date   CHOL 145 06/03/2013   Lab Results  Component Value Date   HDL 28* 06/03/2013   Lab Results  Component Value Date   LDLCALC 84 06/03/2013   Lab Results  Component Value Date   TRIG 163* 06/03/2013   Lab Results  Component Value Date   CHOLHDL 5.2 06/03/2013     Radiology/Studies: Ct Angio Head W/cm &/or Wo Cm 06/03/2013   EXAM: CT ANGIOGRAPHY HEAD AND NECK  TECHNIQUE: Multidetector CT imaging of the head and neck was performed using the standard protocol during bolus administration of intravenous contrast. Multiplanar CT image reconstructions including MIPs were obtained to evaluate the vascular anatomy. Carotid stenosis measurements (when applicable) are obtained utilizing NASCET criteria, using the distal internal carotid diameter as the denominator.  CONTRAST:  161mL OMNIPAQUE IOHEXOL 350 MG/ML SOLN  COMPARISON:  None.  FINDINGS: CTA HEAD FINDINGS  Without petrous, cavernous, and supra clinoid segments of the internal carotid arteries are well opacified with widely patent antegrade flow. No high-grade flow-limiting stenosis within the internal carotid arteries. A1 segments are of equal caliber bilaterally and are widely patent. Anterior communicating artery is unremarkable. The anterior cerebral arteries are well opacified. The middle cerebral arteries are well opacified bilaterally without  evidence of high-grade stenosis or occlusion. There is subtle attenuation of the distal right MCA branches as compared to the left, best appreciated on sagittal MIPS reconstructions.  No aneurysm seen within the anterior circulation.  Intracranial portions of the vertebral arteries are widely patent in well opacified bilaterally. Posterior inferior cerebral arteries are within normal limits. Fenestration of the proximal basilar artery noted. No basilar tip stenosis or basilar tip aneurysm. Posterior cerebral arteries are  well opacified bilaterally without high-grade stenosis or occlusion. Superior cerebellar arteries, and anterior inferior cerebral arteries are grossly normal.  No aneurysm identified within the posterior circulation.  Review of the MIP images confirms the above findings.  CTA NECK FINDINGS  Visualized aortic arch is within normal limits with normal 3 vessel morphology. A few scattered calcified plaques seen within the aortic arch and at the origin of the left common carotid artery. No high-grade stenosis seen at the origin of the great vessels.  The visualized subclavian arteries are within normal limits.  The common carotid arteries are widely patent and symmetric in caliber without evidence of high-grade stenosis. Minimal atherosclerotic plaque noted about the right carotid bifurcation. The internal carotid arteries are well opacified bilaterally through their distal cervical segments without high-grade stenosis, dissection, or other abnormality.  The external carotid arteries and their branch vessels are within normal limits.  The vertebral arteries appear codominant. The proximal left vertebral artery is tortuous. Vertebral arteries are widely patent without evidence of occlusion or high-grade stenosis.  Review of the MIP images confirms the above findings.  Visualized soft tissues of the neck are within normal limits without evidence of adenopathy, mass lesion, or loculated fluid collection.  Visualized salivary glands are normal. The thyroid is within normal limits.  Visualized superior mediastinum is within normal limits.  Dependent atelectasis noted within the partially visualized lungs. No acute osseous abnormality.  IMPRESSION: CTA HEAD:  1. Subtle attenuation of the distal right superior MCA territory branch vessels as compared to the left. No proximal branch occlusion or high-grade flow-limiting stenosis identified within the right middle cerebral artery proximally. 2. Otherwise unremarkable CTA of the head without evidence of occlusion, high-grade stenosis, or aneurysm elsewhere within the brain. CTA NECK:  1. Normal CTA of the neck without evidence of high-grade stenosis, occlusion, or dissection. 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   Electronically Signed   By: Jeannine Boga M.D.   On: 06/03/2013 06:33   06/03/2013   CLINICAL DATA:  Hypertensive crisis, status post tPA.  EXAM: MRI HEAD WITHOUT CONTRAST  TECHNIQUE: Multiplanar, multiecho pulse sequences of the brain and surrounding structures were obtained without intravenous contrast.  COMPARISON:  CT of the head June 03, 2013 at 0359 hr.  FINDINGS: Right frontotemporal reduced diffusion with corresponding low ADC values consistent with acute middle cerebral artery territory infarct, measuring at least 10 x 4.2 cm (AP by transverse). Mild underlying FLAIR T2 hyperintense signal. Minimal curvilinear underlying susceptibility artifact within the right temporal lobe may reflect petechial hemorrhage. Sparing of the basal ganglia.  Reduced diffusion with normalized ADC values within the left temporal lobe most consistent with T2 shine through, which shows corresponding mild cortical and subcortical encephalomalacia consistent with remote process. A few additional subcentimeter supratentorial white matter FLAIR T2 hyperintensity seen, less than expected for age and may reflect sequelae of chronic small vessel ischemic disease. The  ventricles and sulci are overall normal for patient's age. No midline shift or mass effect.  No abnormal extra-axial fluid collections. Normal major intracranial vascular flow voids seen at the skull base, fenestrated basilar artery flow void again noted.  Mildly atretic right maxillary sinus with paranasal sinus mucosal thickening and left maxillary sinus air-fluid level. Mastoid air cells are well aerated. Mild temporomandibular osteoarthrosis. No suspicious calvarial bone marrow signal. Craniocervical junction maintained. No abnormal sellar expansion.  IMPRESSION: Acute large right middle cerebral artery territory infarct. Minimal underlying petechial hemorrhage without lobar hematoma.  Left occipital encephalomalacia suggests  remote small left posterior cerebral artery territory infarct.  Minimal additional white matter changes suggest chronic small vessel ischemic disease.  Acute on chronic paranasal sinusitis.  Dr. Janann Colonel, neuro ICU paged June 03, 2013 at 1540 hr, waiting return call at time of study interpretation.  Electronically Signed: By: Elon Alas On: 06/03/2013 15:42   Dg Chest Port 1 View 06/03/2013   CLINICAL DATA:  Pain.  EXAM: PORTABLE CHEST - 1 VIEW  COMPARISON:  None.  FINDINGS: Lungs are hypoinflated without definite focal consolidation or effusion. Cardiomediastinal silhouette is within normal. There is mild degenerative change of the spine.  IMPRESSION: Hypoinflation without acute cardiopulmonary disease.   Electronically Signed   By: Marin Olp M.D.   On: 06/03/2013 08:01    [id number] rhythm, rate 70, 1st degree AV block, PR 232  TELEMETRY: sinus rhythm, no atrial arrhythmias  Assessment and Plan:  1. Cryptogenic stroke The patient presents with cryptogenic stroke.  TEE is reviewed and reveals no thrombus or PFT.  I spoke at length with the patient about monitoring for afib with either a 30 day event monitor or an implantable loop recorder.  Risks, benefits, and  alteratives to implantable loop recorder were discussed with the patient today.   At this time, the patient is very clear in their decision to proceed with implantable loop recorder. We will proceed with ILR placement at this time.  OK to proceed with rehab later today. We will schedule a wound check in our office in 10 days.  Please call with questions.

## 2013-06-07 NOTE — Progress Notes (Signed)
SLP Cancel Note: [name], [place] CCC-SLP [phone]

## 2013-06-07 NOTE — Progress Notes (Signed)
Patient admitted to inpatient rehab at 1805.  Patient alert and oriented, in bed alarm in place, call bell within reach.  Wife at the bedside, will continue to monitor.

## 2013-06-07 NOTE — Interval H&P Note (Signed)
Donald [name] was admitted today to Inpatient Rehabilitation with the diagnosis of embolic right MCA infarct.  The patient's history has been reviewed, patient examined, and there is no change in status.  Patient continues to be appropriate for intensive inpatient rehabilitation.  I have reviewed the patient's chart and labs.  Questions were answered to the patient's satisfaction.  Rikki Trosper T 06/07/2013, 9:35 PM

## 2013-06-07 NOTE — Discharge Summary (Signed)
Stroke Discharge Summary  Patient ID: Donald [name]   MRN: [id number]      DOB: 1945/10/08  Date of Admission: 06/03/2013 Date of Discharge: 06/07/2013  Attending Physician:  Suzzanne Cloud, MD, Stroke MD  Consulting Physician(s):  Treatment Team:  Thompson Grayer, MD, Alger Simons, MD (Physical Medicine & Rehabtilitation)  Patient's PCP:  No PCP Per Patient  Discharge Diagnoses:  Principal Problem:   large right middle cerebral artery infarct, embolic - s/p IV t-PA, source unknown Active Problems:   Hypertension   Diabetes   Small vessel disease   Obesity, BMI  Body mass index is 35.05 kg/(m^2).   Past Medical History  Diagnosis Date  . Hypertension   . Diabetes mellitus without complication   . Hyperlipemia   . large right middle cerebral artery infarct, embolic 3/0/8657    a. s/p IV tPA, b. source unknown, c. loop recorder placed   History reviewed. No pertinent past surgical history.  Medications to be continued on Rehab . amLODipine  10 mg Oral Daily  . amoxicillin  500 mg Oral Q8H  . carvedilol  12.5 mg Oral BID WC  . cloNIDine  0.3 mg Oral BID  . clopidogrel  75 mg Oral Q breakfast  . glyBURIDE  5 mg Oral Q breakfast  . insulin aspart  0-15 Units Subcutaneous TID WC  . lisinopril  40 mg Oral Daily  . metFORMIN  1,500 mg Oral Q breakfast  . pantoprazole (PROTONIX) IV  40 mg Intravenous QHS    LABORATORY STUDIES CBC    Component Value Date/Time   WBC 7.8 06/03/2013 0355   RBC 4.22 06/03/2013 0355   HGB 14.3 06/03/2013 0400   HCT 42.0 06/03/2013 0400   PLT 172 06/03/2013 0355   MCV 92.9 06/03/2013 0355   MCH 33.4 06/03/2013 0355   MCHC 36.0 06/03/2013 0355   RDW 14.2 06/03/2013 0355   LYMPHSABS 2.9 06/03/2013 0355   MONOABS 0.7 06/03/2013 0355   EOSABS 0.3 06/03/2013 0355   BASOSABS 0.1 06/03/2013 0355   CMP    Component Value Date/Time   NA 140 06/03/2013 0400   K 3.4* 06/03/2013 0400   CL 103 06/03/2013 0400   CO2 25 06/03/2013 0355   GLUCOSE 125* 06/03/2013 0400    BUN 13 06/03/2013 0400   CREATININE 1.10 06/03/2013 0400   CALCIUM 8.5 06/03/2013 0355   PROT 6.9 06/03/2013 0355   ALBUMIN 3.7 06/03/2013 0355   AST 14 06/03/2013 0355   ALT 23 06/03/2013 0355   ALKPHOS 64 06/03/2013 0355   BILITOT 0.6 06/03/2013 0355   GFRNONAA 88* 06/03/2013 0355   GFRAA >90 06/03/2013 0355   COAGS Lab Results  Component Value Date   INR 1.02 06/03/2013   Lipid Panel    Component Value Date/Time   CHOL 145 06/03/2013 0455   TRIG 163* 06/03/2013 0455   HDL 28* 06/03/2013 0455   CHOLHDL 5.2 06/03/2013 0455   VLDL 33 06/03/2013 0455   LDLCALC 84 06/03/2013 0455   HgbA1C  Lab Results  Component Value Date   HGBA1C 6.2* 06/03/2013   Cardiac Panel (last 3 results) No results found for this basename: CKTOTAL, CKMB, TROPONINI, RELINDX,  in the last 72 hours Urinalysis    Component Value Date/Time   COLORURINE YELLOW 06/03/2013 [place] 06/03/2013 0605   LABSPEC 1.024 06/03/2013 0605   PHURINE 7.0 06/03/2013 0605   GLUCOSEU 250* 06/03/2013 0605   HGBUR SMALL* 06/03/2013 8469  BILIRUBINUR NEGATIVE 06/03/2013 0605   KETONESUR 15* 06/03/2013 0605   PROTEINUR NEGATIVE 06/03/2013 0605   UROBILINOGEN 0.2 06/03/2013 0605   NITRITE NEGATIVE 06/03/2013 0605   LEUKOCYTESUR NEGATIVE 06/03/2013 0605   Urine Drug Screen     Component Value Date/Time   LABOPIA NONE DETECTED 06/03/2013 0605   COCAINSCRNUR NONE DETECTED 06/03/2013 0605   LABBENZ NONE DETECTED 06/03/2013 0605   AMPHETMU NONE DETECTED 06/03/2013 0605   THCU NONE DETECTED 06/03/2013 0605   LABBARB NONE DETECTED 06/03/2013 0605    Alcohol Level    Component Value Date/Time   ETH <11 06/03/2013 0355    SIGNIFICANT DIAGNOSTIC STUDIES CT of the brain  06/04/2013 Stable size and distribution of evolving acute right MCA territory infarct. No evidence of hemorrhagic conversion.  06/03/2013 No acute intracranial abnormalities.  CT angio head 06/03/2013 1. Subtle attenuation of the distal right superior MCA territory branch vessels as compared to the left. No  proximal branch occlusion or high-grade flow-limiting stenosis identified within the right middle cerebral artery proximally. 2. Otherwise unremarkable CTA of the head without evidence of occlusion, high-grade stenosis, or aneurysm elsewhere within the brain.  CT angio neck 06/03/2013 1. Normal CTA of the neck without evidence of high-grade stenosis, occlusion, or dissection. 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  MRI of the brain Acute large right middle cerebral artery territory infarct. Minimal underlying petechial hemorrhage without lobar hematoma. 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 Minimal additional white matter changes suggest chronic small vessel ischemic disease. Acute on chronic paranasal sinusitis.  2D Echocardiogram EF 55-60% with no source of embolus.  TEE no thrombus, no PFO, no source of embolus  EKG Sinus rhythm. Prolonged PR interval. Incomplete RBBB and LAFB. Consider anterolateral infarct. Abnormal T, consider ischemia, lateral leads.     History of Present Illness   Donald [name] is an 68 y.o. male who is visiting here for the holidays. Awakened this morning 06/03/2013 to help his grandchildren to bed. When he got up he was normal. As he was returning to bed began to be short of breath. Patient then was noted to have slurred speech by his wife and she noted a facial droop. Patient seemed to be weak on the left a well. EMS was called and the patient was brought in as a code stroke. Initial NIHSS of 8. Patient was given TPA. No intervenable lesion was seen on CTA. He was admitted to the neuro ICU for further evaluation and treatment.   Hospital Course Patient tolerated tPA without complication. Imaging at 24 hours shows no hemorrhage. MRI confirmed a large ischemic infarct in the right middle cerebral artery territory. in the setting of a small old left posterior cerebral artery territory infarct. Infarct  embolic secondary to unknown source. TEE was unrevealing. Loop recorder was placed to evaluate for atrial fibrillation.  He was on  aspirin 81 mg orally every day prior to admission. He was started on/changed to clopidogrel 75 mg orally every day for secondary stroke prevention.   Patient with vascular risk factors of:   Hypertension, cardene given for control in order to administer TPA. cardene quickly weaned. BP has been elevated. Multiple home medications resumed  Diabetes, HgbA1c 6.2, at goal < 7.0  small vessel disease   Obesity, Body mass index is 35.05 kg/(m^2).   Patient also with:  Sinusitis   Patient with resultant left hemiparesis, dysarthria. Physical therapy, occupational therapy and speech therapy evaluated patient. All agreed inpatient  rehab is needed. Patient's wife is supportive and can provide care at discharge. CIR bed is available today and patient will be transferred there.  Discharge Exam  Blood pressure 179/92, pulse 65, temperature 98 F (36.7 C), temperature source Oral, resp. rate 17, height 6\' 3"  (1.905 m), weight 127.2 kg (280 lb 6.8 oz), SpO2 97.00%.  General Examination:  HEENT- Normocephalic, no lesions, without obvious abnormality. Normal external eye and conjunctiva. Neck supple with no masses, nodes, nodules or enlargement.  Cardiovascular - S1, S2 normal  Lungs - chest clear, no wheezing, rales, normal symmetric air entry  Abdomen - soft, non-tender; bowel sounds normal; no masses, no organomegaly, protuberant  Extremities - mild edema in the lower extremities bilaterally  Neurologic Examination:  Mental Status:  Lethargic, oriented to name/location/Jan 2015, thought content appropriate. Speech fluent but dysarthric. Able to follow 3 step commands without difficulty. Left neglect  Cranial Nerves:  II: L inferior quadrantnopia, pupils equal, round, reactive to light  III,IV, VI: ptosis not present, right gaze preference with patient able to voluntarily  move eyes horizontally to the left. Vertical gaze intact.  V,VII: left facial droop, facial light touch sensation decreased on the left  VIII: hearing normal bilaterally  IX,X: gag reflex reduced  XI: bilateral shoulder shrug  XII: midline tongue extension  Motor:  Right : Upper extremity 5/5 Left: Upper extremity 2/5  Lower extremity 5/5 Left Lower extremity 2/5  Tone and bulk:normal tone throughout; no atrophy noted  Sensory: absent LT on the left, neglect with abnormal 2 point discrimination  Deep Tendon Reflexes: 2+ and symmetric with absent AJ's bilaterally  Plantars:  Right: downgoing Left: upgoing  Cerebellar:  normal finger-to-nose testing on the right and normal heel-to-shin testing bilaterally. Unable to assess on L due to weakness  Gait: Unable to test    Discharge Diet  Dysphagia 2 thin liquids  Discharge Plan  Disposition:  Transfer to [facility] for ongoing PT, OT and ST  clopidogrel 75 mg orally every day for secondary stroke prevention.  Recommend ongoing risk factor control by Primary Care Physician at time of discharge from inpatient rehabilitation. Risk factor recommendations:  Hypertension target range 130-140/70-80 Lipid range - LDL < 100 and checked every 6 months, fasting Diabetes - HgB A1C <7   Follow-up No PCP Per Patient in 1 month following discharge from rehab.  Follow-up with Dr. Antony Contras, Stroke Clinic in 2 months.  45 minutes were spent preparing discharge.  Signed Burnetta Sabin, AVNP, ANP-BC, [facility] Nurse Practitioner 06/07/2013, 4:29 PM  I have personally examined this patient, reviewed pertinent data and developed the plan of care. I agree with above.  Antony Contras, MD

## 2013-06-07 NOTE — PMR Pre-admission (Signed)
PMR Admission Coordinator Pre-Admission Assessment  Patient: Donald [name] is an 68 y.o., male MRN: [id number] DOB: 02/15/1946 Height: 6\' 3"  (190.5 cm) Weight: 127.2 kg (280 lb 6.8 oz)              Insurance Information HMO:     PPO:      PCP:      IPA:      80/20: yes     OTHER: no HMO PRIMARY: Medicare a and b      Policy#: [id number]      Subscriber: pt Benefits:  Phone #: on line     Name: 06/06/12 Eff. Date: 09/30/12     Deduct: $1260      Out of Pocket Max: none      Life Max: none CIR: 100%      SNF: 20 full days Outpatient: 80%     Co-Pay: 20% Home Health: 100%      Co-Pay: none DME: 80%     Co-Pay: 20% Providers: pt choice  SECONDARY: [employer]      Policy#: [id number]      Subscriber: pt Medicaid Application Date:       Case Manager:  Disability Application Date:       Case Worker:   Emergency [place]   Name Relation Home Work Mobile   [place] Spouse [phone]     [facility] Daughter [phone]       Current Medical History  Patient Admitting Diagnosis: Right MCA infarct  History of Present Illness: Donald [name] is a 68 y.o. right-handed male with history of hypertension as well as diabetes mellitus. Patient is from [place] he was visiting family in [place].  Admitted 06/03/2013 of left-sided weakness and slurred speech. 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 Echocardiogram with ejection fraction 67% grade 1 diastolic dysfunction. CT angiogram head and neck with no proximal branch occlusion or stenosis. Patient did receive TPA. Neurology services consulted placed on Plavix for CVA prophylaxis. TEE is without PFO. LOOP recorder 06/07/13. 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    Total: 9 NIH    Past Medical  History  Past Medical History  Diagnosis Date  . Hypertension   . Diabetes mellitus without complication   . Hyperlipemia   . 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 s/p IV tPA, b. source unknown, c. loop recorder placed    Family History  family history is not on file.  Prior Rehab/Hospitalizations: none; wife at inpt rehab 2 yrs ago after BTKRs  Current Medications  Current facility-administered medications:0.9 %  sodium chloride infusion, , Intravenous, Continuous, Alexis Goodell, MD, Last Rate: 75 mL/hr at 06/07/13 0614, 1,000 mL at 06/07/13 0614;  0.9 %  sodium chloride infusion, , Intravenous, Continuous, Rogelia Mire, NP;  acetaminophen (TYLENOL) suppository 650 mg, 650 mg, Rectal, Q4H PRN, Alexis Goodell, MD acetaminophen (TYLENOL) tablet 650 mg, 650 mg, Oral, Q4H PRN, Alexis Goodell, MD, 650 mg at 06/07/13 1427;  amLODipine (NORVASC) tablet 10 mg, 10 mg, Oral, Daily, Hulen Luster, DO, 10 mg at 06/07/13 1425;  amoxicillin (AMOXIL) capsule 500 mg, 500 mg, Oral, Q8H, Dorian Pod, MD, 500 mg at 06/07/13 1437;  carvedilol (COREG) tablet 12.5 mg, 12.5 mg, Oral, BID WC, Donzetta Starch, NP, 12.5 mg at 06/07/13 0833 cloNIDine (  CATAPRES) tablet 0.3 mg, 0.3 mg, Oral, BID, Donzetta Starch, NP, 0.3 mg at 06/07/13 1425;  clopidogrel (PLAVIX) tablet 75 mg, 75 mg, Oral, Q breakfast, Hulen Luster, DO, 75 mg at 06/07/13 7829;  glyBURIDE (DIABETA) tablet 5 mg, 5 mg, Oral, Q breakfast, Donzetta Starch, NP, 5 mg at 06/07/13 5621;  hydrALAZINE (APRESOLINE) injection 10 mg, 10 mg, Intravenous, Q4H PRN, Donzetta Starch, NP, 10 mg at 06/06/13 1845 insulin aspart (novoLOG) injection 0-15 Units, 0-15 Units, Subcutaneous, TID WC, Alexis Goodell, MD, 2 Units at 06/07/13 0847;  labetalol (NORMODYNE,TRANDATE) injection 10-40 mg, 10-40 mg, Intravenous, Q10 min PRN, Hulen Luster, DO, 40 mg at 06/06/13 2105;  lisinopril (PRINIVIL,ZESTRIL) tablet 40 mg, 40 mg, Oral,  Daily, Donzetta Starch, NP, 40 mg at 06/07/13 1425 metFORMIN (GLUCOPHAGE-XR) 24 hr tablet 1,500 mg, 1,500 mg, Oral, Q breakfast, Donzetta Starch, NP, 1,500 mg at 06/07/13 3086;  pantoprazole (PROTONIX) injection 40 mg, 40 mg, Intravenous, QHS, Alexis Goodell, MD, 40 mg at 06/06/13 2217;  senna-docusate (Senokot-S) tablet 1 tablet, 1 tablet, Oral, QHS PRN, Alexis Goodell, MD  Patients Current Diet: Dysphagia 2 diet with thin liquids  Precautions / Restrictions Precautions Precautions: Fall Restrictions Weight Bearing Restrictions: No   Prior Activity Level Community (5-7x/wk): 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  Has home office, works in Press photographer.  Some travel.  Current Functional Level Cognition  Overall Cognitive Status: Within Functional Limits for tasks assessed Orientation Level: Oriented X4    Extremity Assessment (includes Sensation/Coordination)          ADLs  Eating/Feeding: Set up Where Assessed - Eating/Feeding: Chair Grooming: Set up;Brushing hair Where Assessed - Grooming: Supported sitting Upper Body Dressing: Moderate assistance Where Assessed - Upper Body Dressing: Supported sitting Lower Body Dressing: Maximal assistance Where Assessed - Lower Body Dressing: Supine, head of bed up;Supine, head of bed flat;Rolling right and/or left Toilet Transfer: +2 Total assistance Toilet Transfer: Patient Percentage: 60% Toilet Transfer Method: Sit to Loss adjuster, chartered: Other (comment) (from bed; used stedy) Toileting - Clothing Manipulation and Hygiene: Maximal assistance Where Assessed - Toileting Clothing Manipulation and Hygiene: Supine, head of bed flat;Rolling right and/or left Tub/Shower Transfer Method: Not assessed Equipment  Used: Gait belt;Other (comment) (stedy) Transfers/Ambulation Related to ADLs: +2 Total A ADL Comments: Educated on retrograde massage for left hand. Educated also on exercises of left hand by using right hand to assist to help with edema. Educated how weightbearing through LUE is beneficial. Had pt weighbearing and reaching for grooming objects. Educated to support left arm on pillow. Pt placed left arm on table and used right hand to move left arm back and forth on table.    Mobility  Bed Mobility: Rolling Right;Rolling Left;Scooting to [facility] Rolling Right: 3: Mod assist Rolling Left: 3: Mod assist Right Sidelying to Sit: 4: Min assist Sitting - Scoot to Edge of Bed: 3: Mod assist Sit to Sidelying Right: 3: Mod assist;HOB flat Scooting to HOB: 4: Min assist    Transfers  Transfers: Not assessed Sit to Stand: 1: +2 Total assist;From bed Sit to Stand: Patient Percentage: 60% Stand to Sit: 1: +2 Total assist Stand to Sit: Patient Percentage: 60% Transfer via Lift Equipment: Stedy    Ambulation / Gait / Stairs / Emergency planning/management officer  Ambulation/Gait Ambulation/Gait Assistance: Not tested (comment)  Posture / Balance Static Sitting Balance Static Sitting - Balance Support: Right upper extremity supported;Feet supported Static Sitting - Level of Assistance: 3: Mod assist Static Sitting - Comment/# of Minutes: left lateral lean during sitting mod to max assist     Special needs/care consideration Bowel mgmt: continent Bladder mgmt: continent Diabetic mgmt HgbA1C 6.2   Previous Home Environment Living Arrangements: Spouse/significant other Available Help at Discharge: Family;Available 24 hours/day Type of Home: House Home Layout: Two level;Able to live on main level with bedroom/bathroom Home Access: Stairs to enter Entrance Stairs-Rails: Right;Left Entrance Stairs-Number of Steps: 2 Home Care Services: No Additional Comments: pt has regular height commode and walk in  shower.  Discharge Living Setting Plans for Discharge Living Setting: Patient's home;Lives with (comment) (spouse, home in [place] sold 2 23/15. dispo pending ) Type of Home at Discharge: Other (Comment) (unknown; may stay with dtr in [place]; plan always to relocate t) Discharge Home Layout: Other (Comment) (pending) Does the patient have any problems obtaining your medications?: No  Social/Family/Support Systems Patient Roles: Spouse;Parent;Other (Comment) (consulting) Contact Information: Trase Traugott, spouse Anticipated Caregiver: wife retired Equities trader Information: [phone] Ability/Limitations of Caregiver: retired. Will go to [facility] to load home for sale Caregiver Availability: 24/7 Discharge Plan Discussed with Primary Caregiver: Yes Is Caregiver In Agreement with Plan?: Yes Does Caregiver/Family have Issues with Lodging/Transportation while Pt is in Rehab?: No Pt has sold house 07/25/13. Plan to move to [facility] but actual home pending.    Goals/Additional Needs Patient/Family Goal for Rehab: supervsion to  min assist PT, and OT, Mod I SLP Expected length of stay: ELOS 20 to 24 days Dietary Needs: Dysphagia 2 with thin liquids Equipment Needs: Implanted LOOP recorder 06/07/13  Pt/Family Agrees to Admission and willing to participate: Yes Program Orientation Provided & Reviewed with Pt/Caregiver Including Roles  & Responsibilities: Yes   Decrease burden of Care through IP rehab admission: n/a  Possible need for SNF placement upon discharge:not anticipated  Patient Condition: This patient's condition remains as documented in the consult dated 06/06/13, in which the Rehabilitation Physician determined and documented that the patient's condition is appropriate for intensive rehabilitative care in an inpatient rehabilitation facility. Will admit to inpatient rehab today.  Preadmission Screen Completed By:  Cleatrice Burke, 06/07/2013 4:37  PM ______________________________________________________________________   Discussed status with Dr. Naaman Plummer on 06/07/13 at  1637 and received telephone approval for admission today.  Admission Coordinator:  Cleatrice Burke, time [id number] Date 06/07/13.

## 2013-06-07 NOTE — CV Procedure (Signed)
   Transesophageal Echocardiogram Note  Donald [name] [id number] [birth year]  Procedure: Transesophageal Echocardiogram Indications: Stroke  Procedure Details Consent: Obtained Time Out: Verified patient identification, verified procedure, site/side was marked, verified correct patient position, special equipment/implants available, Radiology Safety Procedures followed,  medications/allergies/relevent history reviewed, required imaging and test results available.  Performed  Medications: Fentanyl: 50 mcg Versed: 5 mg  Left Ventrical:  Normal size, mild LVH, normal function.   Mitral Valve: Normal, trace MR, no vegetation  Aortic Valve: Trileaflet, mildly thickened, no vegetation  Tricuspid Valve: Normal, trace TR, no vegetation  Pulmonic Valve: Normal, no PR  Left Atrium/ Left atrial appendage: Dilated LA, no thrombus in LA or LAA, normal filling and emptying velocities, no thrombus in RAA  Atrial septum: Normal, no PFO by color Doppler or bubble study , with and without maneuvers.  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   Complications: No apparent complications Patient did tolerate procedure well.  Donald [name], Donald Evens, MD, [facility] 06/07/2013, 12:09 PM

## 2013-06-07 NOTE — H&P (Signed)
Physical Medicine and Rehabilitation Admission H&P    No chief complaint on file. :  Chief complaint: Left-sided weakness  HPI: Donald [name] is a 68 y.o. right-handed male with history of hypertension as well as diabetes mellitus. Patient is from [place] and was visiting family in [place]. Admitted 06/03/2013 of left-sided weakness and slurred speech. 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 Echocardiogram with ejection fraction 44% grade 1 diastolic dysfunction. CT angiogram head and neck with no proximal branch occlusion or stenosis. Patient did receive TPA. Neurology services consulted placed on Plavix for CVA prophylaxis. TEE showed no PFO or thrombus loop recorder just implanted to rule out arrhythmia as potential cause. 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 Physical and occupational therapy evaluations completed 06/05/2013 with recommendations for physical medicine rehabilitation consult to consider inpatient rehabilitation services. Patient was felt to be a good candidate for inpatient rehabilitation services was admitted for comprehensive rehabilitation program today.  ROS Review of Systems  Musculoskeletal: Positive for myalgias.  All other systems reviewed and are negative  Past Medical History  Diagnosis Date  . Hypertension   . Diabetes mellitus without complication   . Hyperlipemia    History reviewed. No pertinent past surgical history. History reviewed. No pertinent family history. Social History:  reports that he has quit smoking. He has never used smokeless tobacco. He reports that he does not use illicit drugs. His alcohol history is not on file. Allergies: No Known Allergies Medications Prior to Admission  Medication Sig Dispense Refill  . amLODipine (NORVASC) 5 MG  tablet Take 5 mg by mouth daily.      Marland Kitchen aspirin EC 81 MG tablet Take 81 mg by mouth daily.      . carvedilol (COREG) 12.5 MG tablet Take 12.5 mg by mouth 2 (two) times daily with a meal.      . cloNIDine (CATAPRES) 0.3 MG tablet Take 0.3 mg by mouth 2 (two) times daily.      Marland Kitchen glyBURIDE (DIABETA) 5 MG tablet Take 5 mg by mouth daily with breakfast.      . lisinopril (PRINIVIL,ZESTRIL) 40 MG tablet Take 40 mg by mouth daily.      . metFORMIN (GLUCOPHAGE-XR) 750 MG 24 hr tablet Take 1,500 mg by mouth daily with breakfast.        Home: Home Living Family/patient expects to be discharged to:: Inpatient rehab Living Arrangements: Spouse/significant other Available Help at Discharge: Family;Available 24 hours/day Type of Home: House Home Access: Stairs to enter [employer] of Steps: 2 Entrance Stairs-Rails: Right;Left Home Layout: Two level;Able to live on main level with bedroom/bathroom Home Equipment: Bedside commode;Shower seat Additional Comments: pt has regular height commode and walk in shower.   Functional History: Prior Function Comments: Drives.  Has home office, works in Press photographer.  Some travel.  Functional Status:  Mobility: Bed Mobility Bed Mobility: Rolling Left;Rolling Right;Right Sidelying to Sit;Sitting - Scoot to [employer] of Bed Rolling Right: 3: Mod assist Rolling Left: 3: Mod assist Right Sidelying to Sit: 4: Min assist Sitting - Scoot to Edge of Bed: 3: Mod assist Sit to Sidelying Right: 3: Mod assist;HOB flat Transfers Transfers: Sit to Stand;Stand to Sit Sit to Stand: 1: +2 Total assist;From bed Sit to Stand: Patient Percentage: 60% Stand to Sit: 1: +2 Total assist Stand to Sit: Patient Percentage: 60% Transfer via  Lift Equipment: Charlaine Dalton Ambulation/Gait Ambulation/Gait Assistance: Not tested (comment)    ADL: ADL Eating/Feeding: Set up Where Assessed - Eating/Feeding: Chair Grooming: Set up;Brushing hair Where Assessed - Grooming: Supported  sitting Upper Body Dressing: Moderate assistance Where Assessed - Upper Body Dressing: Supported sitting Lower Body Dressing: Maximal assistance Where Assessed - Lower Body Dressing: Supine, head of bed up;Supine, head of bed flat;Rolling right and/or left Toilet Transfer: +2 Total assistance Toilet Transfer Method: Sit to stand Toilet Transfer Equipment: Other (comment) (from bed; used stedy) Tub/Shower Transfer Method: Not assessed Equipment Used: Gait belt;Other (comment) (stedy) Transfers/Ambulation Related to ADLs: +2 Total A ADL Comments: Educated on retrograde massage for left hand. Educated also on exercises of left hand by using right hand to assist to help with edema. Educated how weightbearing through LUE is beneficial. Had pt weighbearing and reaching for grooming objects. Educated to support left arm on pillow. Pt placed left arm on table and used right hand to move left arm back and forth on table.  Cognition: Cognition Overall Cognitive Status: Within Functional Limits for tasks assessed Orientation Level: Oriented X4 Cognition Arousal/Alertness: Awake/alert Behavior During Therapy: WFL for tasks assessed/performed Overall Cognitive Status: Within Functional Limits for tasks assessed     Blood pressure 179/83, pulse 62, temperature 98 F (36.7 C), temperature source Oral, resp. rate 18, height 6\' 3"  (1.905 m), weight 127.2 kg (280 lb 6.8 oz), SpO2 99.00%.   Physical Exam Constitutional: He is oriented to person, place, and time.  HENT: oral mucosa pink and moist Head: Normocephalic.  Eyes:  Pupils round and reactive to light. He does have a right gaze preference.  Neck: Normal range of motion. Neck supple. No thyromegaly present.  Cardiovascular: Normal rate and regular rhythm. No murmurs rubs or gallops Respiratory: Effort normal and breath sounds normal. No respiratory distress. No wheezes or rales GI: Soft. Bowel sounds are normal. He exhibits no distension.   Neurological: He is alert and oriented to person, place, and time.  Follows full commands. Patient with fair awareness of deficits. Left central 7 with mild tongue deviation.    LUE remains trace at deltoid and bicep 0/5 distally. LLE is 0-tr/5 prox to distal. Has   intact pain and light touch sense. Somewhat distractible but reasonable insight and awareness.  Skin: Skin is warm and dry.  Psychiatric: He has a normal mood and affect. His behavior is normal  Results for orders placed during the hospital encounter of 06/03/13 (from the past 48 hour(s))  GLUCOSE, CAPILLARY     Status: Abnormal   Collection Time    06/05/13 12:15 PM      Result Value Range   Glucose-Capillary 153 (*) 70 - 99 mg/dL  GLUCOSE, CAPILLARY     Status: Abnormal   Collection Time    06/05/13  4:37 PM      Result Value Range   Glucose-Capillary 110 (*) 70 - 99 mg/dL  GLUCOSE, CAPILLARY     Status: Abnormal   Collection Time    06/05/13  9:16 PM      Result Value Range   Glucose-Capillary 100 (*) 70 - 99 mg/dL  GLUCOSE, CAPILLARY     Status: Abnormal   Collection Time    06/06/13  6:55 AM      Result Value Range   Glucose-Capillary 141 (*) 70 - 99 mg/dL  GLUCOSE, CAPILLARY     Status: Abnormal   Collection Time    06/06/13  7:40 AM  Result Value Range   Glucose-Capillary 139 (*) 70 - 99 mg/dL   Comment 1 Documented in Chart     Comment 2 Notify RN    GLUCOSE, CAPILLARY     Status: Abnormal   Collection Time    06/06/13 11:45 AM      Result Value Range   Glucose-Capillary 116 (*) 70 - 99 mg/dL   Comment 1 Documented in Chart     Comment 2 Notify RN    GLUCOSE, CAPILLARY     Status: Abnormal   Collection Time    06/06/13  4:27 PM      Result Value Range   Glucose-Capillary 134 (*) 70 - 99 mg/dL   Comment 1 Notify RN    GLUCOSE, CAPILLARY     Status: Abnormal   Collection Time    06/06/13  8:07 PM      Result Value Range   Glucose-Capillary 164 (*) 70 - 99 mg/dL   Comment 1 Documented in Chart      Comment 2 Notify RN     No results found.  Post Admission Physician Evaluation: 1. Functional deficits secondary  to embolic right MCA infarct. 2. Patient is admitted to receive collaborative, interdisciplinary care between the physiatrist, rehab nursing staff, and therapy team. 3. Patient's level of medical complexity and substantial therapy needs in context of that medical necessity cannot be provided at a lesser intensity of care such as a SNF. 4. Patient has experienced substantial functional loss from his/her baseline which was documented above under the "Functional History" and "Functional Status" headings.  Judging by the patient's diagnosis, physical exam, and functional history, the patient has potential for functional progress which will result in measurable gains while on inpatient rehab.  These gains will be of substantial and practical use upon discharge  in facilitating mobility and self-care at the household level. 5. Physiatrist will provide 24 hour management of medical needs as well as oversight of the therapy plan/treatment and provide guidance as appropriate regarding the interaction of the two. 6. 24 hour rehab nursing will assist with bladder management, bowel management, safety, skin/wound care, disease management, medication administration and patient education  and help integrate therapy concepts, techniques,education, etc. 7. PT will assess and treat for/with: Lower extremity strength, range of motion, stamina, balance, functional mobility, safety, adaptive techniques and equipment, NMR, tone mgt, visual perceptual awareness.   Goals are: min to mod assist. 8. OT will assess and treat for/with: ADL's, functional mobility, safety, upper extremity strength, adaptive techniques and equipment, NMR, visual perceptual awareness, family education.   Goals are: min to mod assist. 9. SLP will assess and treat for/with: speech, swallowing, cognition.  Goals are: mod I to  supervision. 10. Case Management and Social Worker will assess and treat for psychological issues and discharge planning. 11. Team conference will be held weekly to assess progress toward goals and to determine barriers to discharge. 12. Patient will receive at least 3 hours of therapy per day at least 5 days per week. 13. ELOS: 22-28 days       14. Prognosis:  good   Medical Problem List and Plan: 1. 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 Loop recorder implanted today. 2. DVT Prophylaxis/Anticoagulation: SCDs. Monitor for any signs of DVT 3. Pain Management: Tylenol as needed for 4. Neuropsych: This patient is capable of making decisions on his own behalf. 5. Hypertension. Norvasc 10 mg daily, Coreg 12.5 mg twice a day, Catapres 0.3 mg twice a day, lisinopril  40 mg daily. Monitor with increased mobility 6. Diabetes mellitus with peripheral neuropathy. Hemoglobin A1c 6.2. DiaBeta 5 mg daily, Glucophage 1500 mg daily. Check blood sugars a.c. and at bedtime. Education by team 7. Sinusitis identified on MRI. Amoxicillin 06/03/2013 x5 days  Meredith Staggers, MD, [facility]   06/07/2013

## 2013-06-07 NOTE — H&P (View-Only) (Signed)
ELECTROPHYSIOLOGY CONSULT NOTE    Patient ID: Donald [name] MRN: [id number], DOB/AGE: 1946/03/21 68 y.o.  Admit date: 06/03/2013 Date of Consult: 06-07-2013  Primary Physician: No PCP Per Patient Primary Cardiologist: new to [place]  Reason for Consultation: cryptogenic stroke - question ILR implant  HPI:  Donald [name] is a 68 year old male with a past medical history of diabetes, hypertension, hyperlipidemia.  He was admitted 06-03-13 with slurred speech and shortness of breath.  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  He was given tPA and admitted.  He has been monitored on telemetry which has demonstrated sinus rhythm.  Stroke work up was completed today with TEE.  EP has been asked to evaluate for placement of implantable loop recorder to look for atrial fibrillation as a cause for his stroke.     Echo this admission demonstrated an EF of 60-65%, no RWMA, grade 1 diastolic dysfunction, LA 51.   The patient and his wife currently live in [place], [place], but are moving to [place].  He denies chest pain, shortness of breath, palpitations, dizziness, or syncope. ROS is negative except as outlined above.  He is recovering from his stroke with plans to be admitted to inpatient rehab today.  Anticipated stay per the patient is 2-3 weeks.   Past Medical History  Diagnosis Date  . Hypertension   . Diabetes mellitus without complication   . Hyperlipemia      Surgical History: History reviewed. No pertinent past surgical history.   Prescriptions prior to admission  Medication Sig Dispense Refill  . amLODipine (NORVASC) 5 MG tablet Take 5 mg by mouth daily.      Marland Kitchen aspirin EC 81 MG tablet Take 81 mg by mouth daily.      . carvedilol (COREG) 12.5 MG tablet Take 12.5 mg by mouth 2 (two) times daily with a meal.      . cloNIDine (CATAPRES) 0.3 MG  tablet Take 0.3 mg by mouth 2 (two) times daily.      Marland Kitchen glyBURIDE (DIABETA) 5 MG tablet Take 5 mg by mouth daily with breakfast.      . lisinopril (PRINIVIL,ZESTRIL) 40 MG tablet Take 40 mg by mouth daily.      . metFORMIN (GLUCOPHAGE-XR) 750 MG 24 hr tablet Take 1,500 mg by mouth daily with breakfast.        Inpatient Medications:  . amLODipine  10 mg Oral Daily  . amoxicillin  500 mg Oral Q8H  . carvedilol  12.5 mg Oral BID WC  . cloNIDine  0.3 mg Oral BID  . clopidogrel  75 mg Oral Q breakfast  . glyBURIDE  5 mg Oral Q breakfast  . insulin aspart  0-15 Units Subcutaneous TID WC  . lisinopril  40 mg Oral Daily  . metFORMIN  1,500 mg Oral Q breakfast  . pantoprazole (PROTONIX) IV  40 mg Intravenous QHS    Allergies: No Known Allergies  History   Social History  . Marital Status: Married    Spouse Name: N/A    Number of Children: N/A  . Years of Education: N/A   Occupational History  . Not on file.   Social History Main Topics  . Smoking status: Former Research scientist (life sciences)  . Smokeless tobacco: Never Used  . Alcohol Use: Not on file  . Drug Use: No  . Sexual Activity: No   Other  Topics Concern  . Not on file   Social History Narrative  . No narrative on file     History reviewed. No pertinent family history.   Physical Exam: Filed Vitals:   06/07/13 1255 06/07/13 1300 06/07/13 1310 06/07/13 1434  BP: 197/92 172/109 186/80 179/92  Pulse: 65 64 65   Temp:      TempSrc:      Resp: 13 14 17    Height:      Weight:      SpO2: 97% 97% 97%     GEN- The patient is well appearing, alert with R sided weakness s/p stroke Head- normocephalic, atraumatic Eyes-  Sclera clear, conjunctiva pink Ears- hearing intact Oropharynx- clear Neck- supple, no JVP Lymph- no cervical lymphadenopathy Lungs- Clear to ausculation bilaterally, normal work of breathing Heart- Regular rate and rhythm, no murmurs, rubs or gallops, PMI not laterally displaced GI- soft, NT, ND, + BS Extremities-  no clubbing, cyanosis, or edema MS- no significant deformity or atrophy Skin- no rash or lesion    Labs:   Lab Results  Component Value Date   WBC 7.8 06/03/2013   HGB 14.3 06/03/2013   HCT 42.0 06/03/2013   MCV 92.9 06/03/2013   PLT 172 06/03/2013    Recent Labs Lab 06/03/13 0355 06/03/13 0400  NA 140 140  K 3.5* 3.4*  CL 102 103  CO2 25  --   BUN 14 13  CREATININE 0.88 1.10  CALCIUM 8.5  --   PROT 6.9  --   BILITOT 0.6  --   ALKPHOS 64  --   ALT 23  --   AST 14  --   GLUCOSE 126* 125*   Lab Results  Component Value Date   TROPONINI <0.30 06/03/2013   Lab Results  Component Value Date   CHOL 145 06/03/2013   Lab Results  Component Value Date   HDL 28* 06/03/2013   Lab Results  Component Value Date   LDLCALC 84 06/03/2013   Lab Results  Component Value Date   TRIG 163* 06/03/2013   Lab Results  Component Value Date   CHOLHDL 5.2 06/03/2013     Radiology/Studies: Ct Angio Head W/cm &/or Wo Cm 06/03/2013   EXAM: CT ANGIOGRAPHY HEAD AND NECK  TECHNIQUE: Multidetector CT imaging of the head and neck was performed using the standard protocol during bolus administration of intravenous contrast. Multiplanar CT image reconstructions including MIPs were obtained to evaluate the vascular anatomy. Carotid stenosis measurements (when applicable) are obtained utilizing NASCET criteria, using the distal internal carotid diameter as the denominator.  CONTRAST:  161mL OMNIPAQUE IOHEXOL 350 MG/ML SOLN  COMPARISON:  None.  FINDINGS: CTA HEAD FINDINGS  Without petrous, cavernous, and supra clinoid segments of the internal carotid arteries are well opacified with widely patent antegrade flow. No high-grade flow-limiting stenosis within the internal carotid arteries. A1 segments are of equal caliber bilaterally and are widely patent. Anterior communicating artery is unremarkable. The anterior cerebral arteries are well opacified. The middle cerebral arteries are well opacified bilaterally without  evidence of high-grade stenosis or occlusion. There is subtle attenuation of the distal right MCA branches as compared to the left, best appreciated on sagittal MIPS reconstructions.  No aneurysm seen within the anterior circulation.  Intracranial portions of the vertebral arteries are widely patent in well opacified bilaterally. Posterior inferior cerebral arteries are within normal limits. Fenestration of the proximal basilar artery noted. No basilar tip stenosis or basilar tip aneurysm. Posterior cerebral arteries are  well opacified bilaterally without high-grade stenosis or occlusion. Superior cerebellar arteries, and anterior inferior cerebral arteries are grossly normal.  No aneurysm identified within the posterior circulation.  Review of the MIP images confirms the above findings.  CTA NECK FINDINGS  Visualized aortic arch is within normal limits with normal 3 vessel morphology. A few scattered calcified plaques seen within the aortic arch and at the origin of the left common carotid artery. No high-grade stenosis seen at the origin of the great vessels.  The visualized subclavian arteries are within normal limits.  The common carotid arteries are widely patent and symmetric in caliber without evidence of high-grade stenosis. Minimal atherosclerotic plaque noted about the right carotid bifurcation. The internal carotid arteries are well opacified bilaterally through their distal cervical segments without high-grade stenosis, dissection, or other abnormality.  The external carotid arteries and their branch vessels are within normal limits.  The vertebral arteries appear codominant. The proximal left vertebral artery is tortuous. Vertebral arteries are widely patent without evidence of occlusion or high-grade stenosis.  Review of the MIP images confirms the above findings.  Visualized soft tissues of the neck are within normal limits without evidence of adenopathy, mass lesion, or loculated fluid collection.  Visualized salivary glands are normal. The thyroid is within normal limits.  Visualized superior mediastinum is within normal limits.  Dependent atelectasis noted within the partially visualized lungs. No acute osseous abnormality.  IMPRESSION: CTA HEAD:  1. Subtle attenuation of the distal right superior MCA territory branch vessels as compared to the left. No proximal branch occlusion or high-grade flow-limiting stenosis identified within the right middle cerebral artery proximally. 2. Otherwise unremarkable CTA of the head without evidence of occlusion, high-grade stenosis, or aneurysm elsewhere within the brain. CTA NECK:  1. Normal CTA of the neck without evidence of high-grade stenosis, occlusion, or dissection. 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   Electronically Signed   By: Jeannine Boga M.D.   On: 06/03/2013 06:33   06/03/2013   CLINICAL DATA:  Hypertensive crisis, status post tPA.  EXAM: MRI HEAD WITHOUT CONTRAST  TECHNIQUE: Multiplanar, multiecho pulse sequences of the brain and surrounding structures were obtained without intravenous contrast.  COMPARISON:  CT of the head June 03, 2013 at 0359 hr.  FINDINGS: Right frontotemporal reduced diffusion with corresponding low ADC values consistent with acute middle cerebral artery territory infarct, measuring at least 10 x 4.2 cm (AP by transverse). Mild underlying FLAIR T2 hyperintense signal. Minimal curvilinear underlying susceptibility artifact within the right temporal lobe may reflect petechial hemorrhage. Sparing of the basal ganglia.  Reduced diffusion with normalized ADC values within the left temporal lobe most consistent with T2 shine through, which shows corresponding mild cortical and subcortical encephalomalacia consistent with remote process. A few additional subcentimeter supratentorial white matter FLAIR T2 hyperintensity seen, less than expected for age and may reflect sequelae of chronic small vessel ischemic disease. The  ventricles and sulci are overall normal for patient's age. No midline shift or mass effect.  No abnormal extra-axial fluid collections. Normal major intracranial vascular flow voids seen at the skull base, fenestrated basilar artery flow void again noted.  Mildly atretic right maxillary sinus with paranasal sinus mucosal thickening and left maxillary sinus air-fluid level. Mastoid air cells are well aerated. Mild temporomandibular osteoarthrosis. No suspicious calvarial bone marrow signal. Craniocervical junction maintained. No abnormal sellar expansion.  IMPRESSION: Acute large right middle cerebral artery territory infarct. Minimal underlying petechial hemorrhage without lobar hematoma.  Left occipital encephalomalacia suggests  remote small left posterior cerebral artery territory infarct.  Minimal additional white matter changes suggest chronic small vessel ischemic disease.  Acute on chronic paranasal sinusitis.  Dr. Janann Colonel, neuro ICU paged June 03, 2013 at 1540 hr, waiting return call at time of study interpretation.  Electronically Signed: By: Elon Alas On: 06/03/2013 15:42   Dg Chest Port 1 View 06/03/2013   CLINICAL DATA:  Pain.  EXAM: PORTABLE CHEST - 1 VIEW  COMPARISON:  None.  FINDINGS: Lungs are hypoinflated without definite focal consolidation or effusion. Cardiomediastinal silhouette is within normal. There is mild degenerative change of the spine.  IMPRESSION: Hypoinflation without acute cardiopulmonary disease.   Electronically Signed   By: Marin Olp M.D.   On: 06/03/2013 08:01    [id number] rhythm, rate 70, 1st degree AV block, PR 232  TELEMETRY: sinus rhythm, no atrial arrhythmias  Assessment and Plan:  1. Cryptogenic stroke The patient presents with cryptogenic stroke.  TEE is reviewed and reveals no thrombus or PFT.  I spoke at length with the patient about monitoring for afib with either a 30 day event monitor or an implantable loop recorder.  Risks, benefits, and  alteratives to implantable loop recorder were discussed with the patient today.   At this time, the patient is very clear in their decision to proceed with implantable loop recorder. We will proceed with ILR placement at this time.  OK to proceed with rehab later today. We will schedule a wound check in our office in 10 days.  Please call with questions.

## 2013-06-07 NOTE — Interval H&P Note (Signed)
History and Physical Interval Note:  06/07/2013 4:00 PM  Donald [name]  has presented today for surgery, with the diagnosis of Stroke  The various methods of treatment have been discussed with the patient and family. After consideration of risks, benefits and other options for treatment, the patient has consented to  Procedure(s): LOOP RECORDER IMPLANT (N/A) as a surgical intervention .  The patient's history has been reviewed, patient examined, no change in status, stable for surgery.  I have reviewed the patient's chart and labs.  Questions were answered to the patient's satisfaction.     Thompson Grayer

## 2013-06-07 NOTE — Op Note (Signed)
SURGEON:  Thompson Grayer, MD     PREPROCEDURE DIAGNOSIS:  Cryptogenic Stroke    POSTPROCEDURE DIAGNOSIS:  Cryptogenic Stroke     PROCEDURES:   1. Implantable loop recorder implantation    INTRODUCTION:  Donald [name] is a 68 y.o. male with a history of unexplained stroke who presents today for implantable loop implantation.  The patient has had a cryptogenic stroke.  Despite an extensive workup by neurology, no reversible causes have been identified.  he has worn telemetry during which he did not have arrhythmias.  There is significant concern for possible atrial fibrillation as the cause for the patients stroke.  The patient therefore presents today for implantable loop implantation.     DESCRIPTION OF PROCEDURE:  Informed written consent was obtained, and the patient was brought to the electrophysiology lab in a fasting state.  The patient required no sedation for the procedure today.  Mapping over the patient's chest was performed by the EP lab staff to identify the area where electrograms were most prominent for ILR recording.  This area was found to be the left parasternal region over the 3rd-4th intercostal space. The patients left chest was therefore prepped and draped in the usual sterile fashion by the EP lab staff. The skin overlying the left parasternal region was infiltrated with lidocaine for local analgesia.  A 0.5-cm incision was made over the left parasternal region over the 3rd intercostal space.  A subcutaneous ILR pocket was fashioned using a combination of sharp and blunt dissection.  A Medtronic Reveal [place] model [id number] implantable loop recorder was then placed into the pocket  R waves were very prominent and measured 0.88mV.  Steri- Strips and a sterile dressing were then applied.  There were no early apparent complications.     CONCLUSIONS:   1. Successful implantation of a Medtronic Reveal LINQ implantable loop recorder for cryptogenic stroke  2. No early  apparent complications.

## 2013-06-07 NOTE — Progress Notes (Signed)
Physical Therapy Treatment Patient Details Name: Donald [name] MRN: [id number] DOB: 1945/09/12 Today's Date: 06/07/2013 Time: 0865-7846 PT Time Calculation (min): 25 min  PT Assessment / Plan / Recommendation  History of Present Illness Donald [name] is an 68 y.o. male who is visiting here for the holidays. Awakened this morning 06/03/2013 to help his grandchildren to bed. When he got up he was normal. As he was returning to bed began to be short of breath. Patient then was noted to have slurred speech by his wife and she noted a facial droop. Patient seemed to be weak on the left a well. EMS was called and the patient was brought in as a code stroke. Initial NIHSS of 8. Patient was given TPA. No intervenable lesion was seen on CTA.   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   PT Comments   Patient demonstrates high motivation throughout session today. Worked primarily on active assisted ROM and neuromuscular facilitation. Patient started session with some active 1st digit (thumb) ROM and some active toe mobility 1st through 4th digits active.  Progressed patient through there ex. By end of session patient was noted to have some active LLE range of adduction and knee flexion. Instructed patient and spouse in continued there-ex to illicit LLE movement. Patient and spouse very encouraged and appreciative to see evidence of muscular return.  Continue to recommend CIR upon acute discharge. Will see as indicated.  Follow Up Recommendations  CIR           Equipment Recommendations  Wheelchair (measurements PT);Wheelchair cushion (measurements PT);3in1 (PT)    Recommendations for Other Services Rehab consult  Frequency Min 4X/week   Progress towards PT Goals Progress towards PT goals: Progressing toward goals  Plan Current plan remains appropriate    Precautions / Restrictions Precautions Precautions: Fall Restrictions Weight Bearing Restrictions: No    Pertinent Vitals/Pain No pain    Mobility  Bed Mobility Bed Mobility: Rolling Right;Rolling Left;Scooting to HOB Rolling Right: 3: Mod assist Rolling Left: 3: Mod assist Scooting to HOB: 4: Min assist Details for Bed Mobility Assistance: Assist for positioning, patient able to rely on rails to perform activities Transfers Transfers: Not assessed Ambulation/Gait Ambulation/Gait Assistance: Not tested (comment)    Exercises General Exercises - Lower Extremity Ankle Circles/Pumps: AROM;AAROM;Left;10 reps Quad Sets: AAROM;Left;10 reps Short Arc Quad: AROM;AAROM;Left;10 reps Heel Slides: PROM;AAROM;Left;10 reps Hip ABduction/ADduction: PROM;AROM;AAROM;Left;10 reps Toe Raises: AROM;AAROM;Left;10 reps    PT Goals (current goals can now be found in the care plan section) Acute Rehab PT Goals Patient Stated Goal: to get stronger PT Goal Formulation: With patient/family Time For Goal Achievement: 06/19/13 Potential to Achieve Goals: Good  Visit Information  Last PT Received On: 06/07/13 Assistance Needed: +2 PT/OT/SLP Co-Evaluation/Treatment: Yes History of Present Illness: Donald [name] is an 68 y.o. male who is visiting here for the holidays. Awakened this morning 06/03/2013 to help his grandchildren to bed. When he got up he was normal. As he was returning to bed began to be short of breath. Patient then was noted to have slurred speech by his wife and she noted a facial droop. Patient seemed to be weak on the left a well. EMS was called and the patient was brought in as a code stroke. Initial NIHSS of 8. Patient was given TPA. No intervenable lesion was seen on CTA.   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    Subjective Data  Subjective: I got my toes going today  Patient Stated Goal: to get stronger   Cognition  Cognition Arousal/Alertness: Awake/alert Behavior During Therapy: WFL for tasks assessed/performed Overall Cognitive Status: Within  Functional Limits for tasks assessed       End of Session PT - End of Session Equipment Utilized During Treatment: Gait belt Activity Tolerance: Patient tolerated treatment well Patient left: in bed;with call bell/phone within reach;with family/visitor present Nurse Communication: Mobility status   GP     Duncan Dull 06/07/2013, 3:46 PM Alben Deeds, [place] DPT  [phone]

## 2013-06-07 NOTE — Progress Notes (Addendum)
I met with pt and his wife at bedside. Discussed inpt rehab venue as an option for his recovery. Both are in agreement to admit. I will contact Burnetta Sabin, [facility] and arrange. RN CM aware. [phone] Await LOOP recorder implantation prior to admit to inpt rehab.

## 2013-06-07 NOTE — H&P (View-Only) (Signed)
Physical Medicine and Rehabilitation Admission H&P    No chief complaint on file. :  Chief complaint: Left-sided weakness  HPI: Donald [name] is a 68 y.o. right-handed male with history of hypertension as well as diabetes mellitus. Patient is from [place] and was visiting family in [place]. Admitted 06/03/2013 of left-sided weakness and slurred speech. 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 Echocardiogram with ejection fraction 44% grade 1 diastolic dysfunction. CT angiogram head and neck with no proximal branch occlusion or stenosis. Patient did receive TPA. Neurology services consulted placed on Plavix for CVA prophylaxis. TEE showed no PFO or thrombus loop recorder just implanted to rule out arrhythmia as potential cause. 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 Physical and occupational therapy evaluations completed 06/05/2013 with recommendations for physical medicine rehabilitation consult to consider inpatient rehabilitation services. Patient was felt to be a good candidate for inpatient rehabilitation services was admitted for comprehensive rehabilitation program today.  ROS Review of Systems  Musculoskeletal: Positive for myalgias.  All other systems reviewed and are negative  Past Medical History  Diagnosis Date  . Hypertension   . Diabetes mellitus without complication   . Hyperlipemia    History reviewed. No pertinent past surgical history. History reviewed. No pertinent family history. Social History:  reports that he has quit smoking. He has never used smokeless tobacco. He reports that he does not use illicit drugs. His alcohol history is not on file. Allergies: No Known Allergies Medications Prior to Admission  Medication Sig Dispense Refill  . amLODipine (NORVASC) 5 MG  tablet Take 5 mg by mouth daily.      Marland Kitchen aspirin EC 81 MG tablet Take 81 mg by mouth daily.      . carvedilol (COREG) 12.5 MG tablet Take 12.5 mg by mouth 2 (two) times daily with a meal.      . cloNIDine (CATAPRES) 0.3 MG tablet Take 0.3 mg by mouth 2 (two) times daily.      Marland Kitchen glyBURIDE (DIABETA) 5 MG tablet Take 5 mg by mouth daily with breakfast.      . lisinopril (PRINIVIL,ZESTRIL) 40 MG tablet Take 40 mg by mouth daily.      . metFORMIN (GLUCOPHAGE-XR) 750 MG 24 hr tablet Take 1,500 mg by mouth daily with breakfast.        Home: Home Living Family/patient expects to be discharged to:: Inpatient rehab Living Arrangements: Spouse/significant other Available Help at Discharge: Family;Available 24 hours/day Type of Home: House Home Access: Stairs to enter [employer] of Steps: 2 Entrance Stairs-Rails: Right;Left Home Layout: Two level;Able to live on main level with bedroom/bathroom Home Equipment: Bedside commode;Shower seat Additional Comments: pt has regular height commode and walk in shower.   Functional History: Prior Function Comments: Drives.  Has home office, works in [employer].  Some travel.  Functional Status:  Mobility: Bed Mobility Bed Mobility: Rolling Left;Rolling Right;Right Sidelying to Sit;Sitting - Scoot to [employer] of Bed Rolling Right: 3: Mod assist Rolling Left: 3: Mod assist Right Sidelying to Sit: 4: Min assist Sitting - Scoot to Edge of Bed: 3: Mod assist Sit to Sidelying Right: 3: Mod assist;HOB flat Transfers Transfers: Sit to Stand;Stand to Sit Sit to Stand: 1: +2 Total assist;From bed Sit to Stand: Patient Percentage: 60% Stand to Sit: 1: +2 Total assist Stand to Sit: Patient Percentage: 60% Transfer via  Lift Equipment: Charlaine Dalton Ambulation/Gait Ambulation/Gait Assistance: Not tested (comment)    ADL: ADL Eating/Feeding: Set up Where Assessed - Eating/Feeding: Chair Grooming: Set up;Brushing hair Where Assessed - Grooming: Supported  sitting Upper Body Dressing: Moderate assistance Where Assessed - Upper Body Dressing: Supported sitting Lower Body Dressing: Maximal assistance Where Assessed - Lower Body Dressing: Supine, head of bed up;Supine, head of bed flat;Rolling right and/or left Toilet Transfer: +2 Total assistance Toilet Transfer Method: Sit to stand Toilet Transfer Equipment: Other (comment) (from bed; used stedy) Tub/Shower Transfer Method: Not assessed Equipment Used: Gait belt;Other (comment) (stedy) Transfers/Ambulation Related to ADLs: +2 Total A ADL Comments: Educated on retrograde massage for left hand. Educated also on exercises of left hand by using right hand to assist to help with edema. Educated how weightbearing through LUE is beneficial. Had pt weighbearing and reaching for grooming objects. Educated to support left arm on pillow. Pt placed left arm on table and used right hand to move left arm back and forth on table.  Cognition: Cognition Overall Cognitive Status: Within Functional Limits for tasks assessed Orientation Level: Oriented X4 Cognition Arousal/Alertness: Awake/alert Behavior During Therapy: WFL for tasks assessed/performed Overall Cognitive Status: Within Functional Limits for tasks assessed     Blood pressure 179/83, pulse 62, temperature 98 F (36.7 C), temperature source Oral, resp. rate 18, height 6\' 3"  (1.905 m), weight 127.2 kg (280 lb 6.8 oz), SpO2 99.00%.   Physical Exam Constitutional: He is oriented to person, place, and time.  HENT: oral mucosa pink and moist Head: Normocephalic.  Eyes:  Pupils round and reactive to light. He does have a right gaze preference.  Neck: Normal range of motion. Neck supple. No thyromegaly present.  Cardiovascular: Normal rate and regular rhythm. No murmurs rubs or gallops Respiratory: Effort normal and breath sounds normal. No respiratory distress. No wheezes or rales GI: Soft. Bowel sounds are normal. He exhibits no distension.   Neurological: He is alert and oriented to person, place, and time.  Follows full commands. Patient with fair awareness of deficits. Left central 7 with mild tongue deviation.    LUE remains trace at deltoid and bicep 0/5 distally. LLE is 0-tr/5 prox to distal. Has   intact pain and light touch sense. Somewhat distractible but reasonable insight and awareness.  Skin: Skin is warm and dry.  Psychiatric: He has a normal mood and affect. His behavior is normal  Results for orders placed during the hospital encounter of 06/03/13 (from the past 48 hour(s))  GLUCOSE, CAPILLARY     Status: Abnormal   Collection Time    06/05/13 12:15 PM      Result Value Range   Glucose-Capillary 153 (*) 70 - 99 mg/dL  GLUCOSE, CAPILLARY     Status: Abnormal   Collection Time    06/05/13  4:37 PM      Result Value Range   Glucose-Capillary 110 (*) 70 - 99 mg/dL  GLUCOSE, CAPILLARY     Status: Abnormal   Collection Time    06/05/13  9:16 PM      Result Value Range   Glucose-Capillary 100 (*) 70 - 99 mg/dL  GLUCOSE, CAPILLARY     Status: Abnormal   Collection Time    06/06/13  6:55 AM      Result Value Range   Glucose-Capillary 141 (*) 70 - 99 mg/dL  GLUCOSE, CAPILLARY     Status: Abnormal   Collection Time    06/06/13  7:40 AM  Result Value Range   Glucose-Capillary 139 (*) 70 - 99 mg/dL   Comment 1 Documented in Chart     Comment 2 Notify RN    GLUCOSE, CAPILLARY     Status: Abnormal   Collection Time    06/06/13 11:45 AM      Result Value Range   Glucose-Capillary 116 (*) 70 - 99 mg/dL   Comment 1 Documented in Chart     Comment 2 Notify RN    GLUCOSE, CAPILLARY     Status: Abnormal   Collection Time    06/06/13  4:27 PM      Result Value Range   Glucose-Capillary 134 (*) 70 - 99 mg/dL   Comment 1 Notify RN    GLUCOSE, CAPILLARY     Status: Abnormal   Collection Time    06/06/13  8:07 PM      Result Value Range   Glucose-Capillary 164 (*) 70 - 99 mg/dL   Comment 1 Documented in Chart      Comment 2 Notify RN     No results found.  Post Admission Physician Evaluation: 1. Functional deficits secondary  to embolic right MCA infarct. 2. Patient is admitted to receive collaborative, interdisciplinary care between the physiatrist, rehab nursing staff, and therapy team. 3. Patient's level of medical complexity and substantial therapy needs in context of that medical necessity cannot be provided at a lesser intensity of care such as a SNF. 4. Patient has experienced substantial functional loss from his/her baseline which was documented above under the "Functional History" and "Functional Status" headings.  Judging by the patient's diagnosis, physical exam, and functional history, the patient has potential for functional progress which will result in measurable gains while on inpatient rehab.  These gains will be of substantial and practical use upon discharge  in facilitating mobility and self-care at the household level. 5. Physiatrist will provide 24 hour management of medical needs as well as oversight of the therapy plan/treatment and provide guidance as appropriate regarding the interaction of the two. 6. 24 hour rehab nursing will assist with bladder management, bowel management, safety, skin/wound care, disease management, medication administration and patient education  and help integrate therapy concepts, techniques,education, etc. 7. PT will assess and treat for/with: Lower extremity strength, range of motion, stamina, balance, functional mobility, safety, adaptive techniques and equipment, NMR, tone mgt, visual perceptual awareness.   Goals are: min to mod assist. 8. OT will assess and treat for/with: ADL's, functional mobility, safety, upper extremity strength, adaptive techniques and equipment, NMR, visual perceptual awareness, family education.   Goals are: min to mod assist. 9. SLP will assess and treat for/with: speech, swallowing, cognition.  Goals are: mod I to  supervision. 10. Case Management and Social Worker will assess and treat for psychological issues and discharge planning. 11. Team conference will be held weekly to assess progress toward goals and to determine barriers to discharge. 12. Patient will receive at least 3 hours of therapy per day at least 5 days per week. 13. ELOS: 22-28 days       14. Prognosis:  good   Medical Problem List and Plan: 1. 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 Loop recorder implanted today. 2. DVT Prophylaxis/Anticoagulation: SCDs. Monitor for any signs of DVT 3. Pain Management: Tylenol as needed for 4. Neuropsych: This patient is capable of making decisions on his own behalf. 5. Hypertension. Norvasc 10 mg daily, Coreg 12.5 mg twice a day, Catapres 0.3 mg twice a day, lisinopril  40 mg daily. Monitor with increased mobility 6. Diabetes mellitus with peripheral neuropathy. Hemoglobin A1c 6.2. DiaBeta 5 mg daily, Glucophage 1500 mg daily. Check blood sugars a.c. and at bedtime. Education by team 7. Sinusitis identified on MRI. Amoxicillin 06/03/2013 x5 days  Meredith Staggers, MD, [facility]   06/07/2013

## 2013-06-07 NOTE — Progress Notes (Signed)
Stroke Team Progress Note  HISTORY Donald [name] is an 68 y.o. male who is visiting here for the holidays. Awakened this morning 06/03/2013 to help his grandchildren to bed. When he got up he was normal. As he was returning to bed began to be short of breath. Patient then was noted to have slurred speech by his wife and she noted a facial droop. Patient seemed to be weak on the left a well. EMS was called and the patient was brought in as a code stroke. Initial NIHSS of 8. Patient was given TPA. No intervenable lesion was seen on CTA. He was admitted to the neuro ICU for further evaluation and treatment.  SUBJECTIVE Pt in TEE - on table @ 1233 - procedure beginning shortly.  OBJECTIVE Most recent Vital Signs: Filed Vitals:   06/07/13 1250 06/07/13 1255 06/07/13 1300 06/07/13 1310  BP: 161/103 197/92 172/109 186/80  Pulse: 68 65 64 65  Temp:      TempSrc:      Resp: 15 13 14 17   Height:      Weight:      SpO2: 98% 97% 97% 97%   CBG (last 3)   Recent Labs  06/06/13 1627 06/06/13 2007 06/07/13 0655  GLUCAP 134* 164* 143*    IV Fluid Intake:   . sodium chloride 1,000 mL (06/07/13 0614)  . sodium chloride      MEDICATIONS  . amLODipine  10 mg Oral Daily  . amoxicillin  500 mg Oral Q8H  . carvedilol  12.5 mg Oral BID WC  . cloNIDine  0.3 mg Oral BID  . clopidogrel  75 mg Oral Q breakfast  . glyBURIDE  5 mg Oral Q breakfast  . insulin aspart  0-15 Units Subcutaneous TID WC  . lisinopril  40 mg Oral Daily  . metFORMIN  1,500 mg Oral Q breakfast  . pantoprazole (PROTONIX) IV  40 mg Intravenous QHS   PRN:  acetaminophen, acetaminophen, hydrALAZINE, labetalol, senna-docusate  Diet:  NPO  Activity:  OOB DVT Prophylaxis:  SCDs   CLINICALLY SIGNIFICANT STUDIES Basic Metabolic Panel:   Recent Labs Lab 06/03/13 0355 06/03/13 0400  NA 140 140  K 3.5* 3.4*  CL 102 103  CO2 25  --   GLUCOSE 126* 125*  BUN 14 13  CREATININE 0.88 1.10  CALCIUM 8.5  --    Liver  Function Tests:   Recent Labs Lab 06/03/13 0355  AST 14  ALT 23  ALKPHOS 64  BILITOT 0.6  PROT 6.9  ALBUMIN 3.7   CBC:   Recent Labs Lab 06/03/13 0355 06/03/13 0400  WBC 7.8  --   NEUTROABS 3.9  --   HGB 14.1 14.3  HCT 39.2 42.0  MCV 92.9  --   PLT 172  --    Coagulation:   Recent Labs Lab 06/03/13 0355  LABPROT 13.2  INR 1.02   Cardiac Enzymes:   Recent Labs Lab 06/03/13 0355  TROPONINI <0.30   Urinalysis:   Recent Labs Lab 06/03/13 0605  COLORURINE YELLOW  LABSPEC 1.024  PHURINE 7.0  GLUCOSEU 250*  HGBUR SMALL*  BILIRUBINUR NEGATIVE  KETONESUR 15*  PROTEINUR NEGATIVE  UROBILINOGEN 0.2  NITRITE NEGATIVE  LEUKOCYTESUR NEGATIVE   Lipid Panel    Component Value Date/Time   CHOL 145 06/03/2013 0455   TRIG 163* 06/03/2013 0455   HDL 28* 06/03/2013 0455   CHOLHDL 5.2 06/03/2013 0455   VLDL 33 06/03/2013 0455   LDLCALC 84 06/03/2013 0455  HgbA1C  Lab Results  Component Value Date   HGBA1C 6.2* 06/03/2013    Urine Drug Screen:     Component Value Date/Time   LABOPIA NONE DETECTED 06/03/2013 0605   COCAINSCRNUR NONE DETECTED 06/03/2013 0605   LABBENZ NONE DETECTED 06/03/2013 0605   AMPHETMU NONE DETECTED 06/03/2013 0605   THCU NONE DETECTED 06/03/2013 0605   LABBARB NONE DETECTED 06/03/2013 0605    Alcohol Level:   Recent Labs Lab 06/03/13 0355  ETH <11    CT of the brain   06/04/2013 Stable size and distribution of evolving acute right MCA territory infarct. No evidence of hemorrhagic conversion. 06/03/2013   No acute intracranial abnormalities.    CT angio head  06/03/2013    1. Subtle attenuation of the distal right superior MCA territory branch vessels as compared to the left. No proximal branch occlusion or high-grade flow-limiting stenosis identified within the right middle cerebral artery proximally. 2. Otherwise unremarkable CTA of the head without evidence of occlusion, high-grade stenosis, or aneurysm elsewhere within the brain.   CT angio neck   06/03/2013   1. Normal CTA of the neck without evidence of high-grade stenosis, occlusion, or dissection. 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 Minimal underlying petechial hemorrhage without lobar hematoma. 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 Minimal additional white matter changes suggest chronic small vessel ischemic disease. Acute on chronic paranasal sinusitis.  2D Echocardiogram  EF 55-60% with no source of embolus.   TEE no thrombus, no PFO, no source of embolus  EKG  Sinus rhythm. Prolonged PR interval. Incomplete RBBB and LAFB. Consider anterolateral infarct. Abnormal T, consider ischemia, lateral leads.   Therapy Recommendations CIR  Physical Exam   General Examination:  HEENT- Normocephalic, no lesions, without obvious abnormality. Normal external eye and conjunctiva. Neck supple with no masses, nodes, nodules or enlargement.  Cardiovascular - S1, S2 normal  Lungs - chest clear, no wheezing, rales, normal symmetric air entry  Abdomen - soft, non-tender; bowel sounds normal; no masses, no organomegaly, protuberant  Extremities - mild edema in the lower extremities bilaterally  Neurologic Examination:  Mental Status:  Lethargic,  Patient is sedated for TEE and is currently on the table for the procedure hence exam is limited  ASSESSMENT Mr. Donald [name] is a 68 y.o. male presenting with difficulty with speech, left sided weakness. Status post IV t-PA 06/03/2013 at 0437. Imaging confirms a large right middle cerebral artery territory infarct in the setting of a small old left posterior cerebral artery territory infarct. Infarct embolic secondary to unknown source. On aspirin 81 mg orally every day prior to admission. Now on plavix 75 mg daily for secondary stroke prevention.  Hypertension, cardene for control in order to  administer TPA. cardene now off. BP has been elevated. Diabetes, HgbA1c 6.2, at goal < 7.0 Sinusitis small vessel ischemic cerebral disease  Hospital day # 4  TREATMENT/PLAN  Continue Plavix 75mg  daily for secondary stroke prevention  TEE done earlier today. [place] electrophysiologist will consult and place implantable loop recorder to evaluate for atrial fibrillation as etiology of stroke. This has been explained to patient/family by Dr. Leonie Man and they are agreeable. Await timing of placement  Rehab consult in place with plans for discharge there today.  Burnetta Sabin, MSN, RN, ANVP-BC, ANP-BC, GNP-BC [facility] Pager: [phone] 06/07/2013 2:29 PM  I have personally obtained a history, examined the patient, evaluated imaging results, and formulated the assessment and plan of care. I agree with the above. Antony Contras, Md

## 2013-06-08 ENCOUNTER — Inpatient Hospital Stay (HOSPITAL_COMMUNITY): Payer: Medicare Other | Admitting: Occupational Therapy

## 2013-06-08 ENCOUNTER — Inpatient Hospital Stay (HOSPITAL_COMMUNITY): Payer: Medicare Other | Admitting: Physical Therapy

## 2013-06-08 ENCOUNTER — Inpatient Hospital Stay (HOSPITAL_COMMUNITY): Payer: Medicare Other

## 2013-06-08 ENCOUNTER — Encounter (HOSPITAL_COMMUNITY): Payer: Self-pay | Admitting: Cardiology

## 2013-06-08 DIAGNOSIS — I6340 Cerebral infarction due to embolism of unspecified cerebral artery: Secondary | ICD-10-CM

## 2013-06-08 DIAGNOSIS — G8110 Spastic hemiplegia affecting unspecified side: Secondary | ICD-10-CM

## 2013-06-08 DIAGNOSIS — I69991 Dysphagia following unspecified cerebrovascular disease: Secondary | ICD-10-CM

## 2013-06-08 LAB — CBC WITH DIFFERENTIAL/PLATELET
Basophils Absolute: 0.1 10*3/uL (ref 0.0–0.1)
Basophils Relative: 1 % (ref 0–1)
Eosinophils Absolute: 0.2 10*3/uL (ref 0.0–0.7)
Eosinophils Relative: 3 % (ref 0–5)
HCT: 41.3 % (ref 39.0–52.0)
Hemoglobin: 14.9 g/dL (ref 13.0–17.0)
Lymphocytes Relative: 25 % (ref 12–46)
Lymphs Abs: 2 10*3/uL (ref 0.7–4.0)
MCH: 33.3 pg (ref 26.0–34.0)
MCHC: 36.1 g/dL — ABNORMAL HIGH (ref 30.0–36.0)
MCV: 92.4 fL (ref 78.0–100.0)
Monocytes Absolute: 0.9 10*3/uL (ref 0.1–1.0)
Monocytes Relative: 12 % (ref 3–12)
Neutro Abs: 4.6 10*3/uL (ref 1.7–7.7)
Neutrophils Relative %: 59 % (ref 43–77)
Platelets: 191 10*3/uL (ref 150–400)
RBC: 4.47 MIL/uL (ref 4.22–5.81)
RDW: 14 % (ref 11.5–15.5)
WBC: 7.8 10*3/uL (ref 4.0–10.5)

## 2013-06-08 LAB — GLUCOSE, CAPILLARY
Glucose-Capillary: 128 mg/dL — ABNORMAL HIGH (ref 70–99)
Glucose-Capillary: 128 mg/dL — ABNORMAL HIGH (ref 70–99)
Glucose-Capillary: 63 mg/dL — ABNORMAL LOW (ref 70–99)
Glucose-Capillary: 85 mg/dL (ref 70–99)
Glucose-Capillary: 94 mg/dL (ref 70–99)

## 2013-06-08 LAB — COMPREHENSIVE METABOLIC PANEL
ALT: 22 U/L (ref 0–53)
AST: 35 U/L (ref 0–37)
Albumin: 3.4 g/dL — ABNORMAL LOW (ref 3.5–5.2)
Alkaline Phosphatase: 70 U/L (ref 39–117)
BUN: 13 mg/dL (ref 6–23)
CO2: 18 mEq/L — ABNORMAL LOW (ref 19–32)
Calcium: 8.7 mg/dL (ref 8.4–10.5)
Chloride: 101 mEq/L (ref 96–112)
Creatinine, Ser: 0.74 mg/dL (ref 0.50–1.35)
GFR calc Af Amer: >90 mL/min (ref >90)
GFR calc non Af Amer: >90 mL/min (ref >90)
Glucose, Bld: 131 mg/dL — ABNORMAL HIGH (ref 70–99)
Potassium: 4 mEq/L (ref 3.7–5.3)
Sodium: 137 mEq/L (ref 137–147)
Total Bilirubin: 0.8 mg/dL (ref 0.3–1.2)
Total Protein: 6.9 g/dL (ref 6.0–8.3)

## 2013-06-08 MED ORDER — INFLUENZA VAC SPLIT QUAD 0.5 ML IM SUSP
0.5000 mL | INTRAMUSCULAR | Status: AC
  Administered 2013-06-09: 0.5 mL via INTRAMUSCULAR
  Filled 2013-06-08: qty 0.5

## 2013-06-08 MED ORDER — PANTOPRAZOLE SODIUM 40 MG PO TBEC
40.0000 mg | DELAYED_RELEASE_TABLET | Freq: Every day | ORAL | Status: DC
  Administered 2013-06-08 – 2013-06-30 (×23): 40 mg via ORAL
  Filled 2013-06-08 (×21): qty 1

## 2013-06-08 MED ORDER — PNEUMOCOCCAL VAC POLYVALENT 25 MCG/0.5ML IJ INJ
0.5000 mL | INJECTION | INTRAMUSCULAR | Status: AC
  Administered 2013-06-09: 0.5 mL via INTRAMUSCULAR
  Filled 2013-06-08 (×2): qty 0.5

## 2013-06-08 NOTE — Evaluation (Signed)
Speech Language Pathology Assessment and Plan  Patient Details  Name: Donald [name] MRN: [id number] Date of Birth: 03-08-1946  SLP Diagnosis: Dysarthria;Dysphagia  Rehab Potential: Excellent ELOS: 24-28 days   Today's Date: 06/08/2013 Time: 1330-1430 Time Calculation (min): 60 min  Problem List:  Patient Active Problem List   Diagnosis Date Noted  . Small vessel disease 06/07/2013  . Obesity, unspecified 06/07/2013  . CVA (cerebral infarction) 06/07/2013  . large right middle cerebral artery infarct, embolic 40/98/1191  . Hypertension 06/03/2013  . Diabetes 06/03/2013   Past Medical History:  Past Medical History  Diagnosis Date  . Hypertension   . Diabetes mellitus without complication   . Hyperlipemia   . large right middle cerebral artery infarct, embolic 09/06/8293    a. s/p IV tPA, b. source unknown, c. loop recorder placed   Past Surgical History:  Past Surgical History  Procedure Laterality Date  . Tee without cardioversion N/A 06/07/2013    Procedure: TRANSESOPHAGEAL ECHOCARDIOGRAM (TEE);  Surgeon: [name], MD;  Location: [facility] ENDOSCOPY;  Service: Cardiovascular;  Laterality: N/A;  . 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 right-handed male with h/o hypertension as well as diabetes mellitus. Pt is from [place] and was visiting family in [place]. Admitted 06/03/13 of left-sided weakness and slurred speech. MRI of the brain showed acute large right MCA infarct as well as left occipital encephalomalacia suggests remote small left posterior cerebral artery territory infarct. Echocardiogram with ejection fraction 62% grade 1 diastolic dysfunction. CT angiogram head and neck with no proximal branch occlusion or stenosis. Patient did receive TPA. Neurology services consulted placed on Plavix for CVA prophylaxis. TEE showed no PFO or thrombus  loop recorder just implanted to rule out arrhythmia as potential cause. 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 PT/OT evaluations completed 06/05/13 with recommendations for physical medicine rehabilitation consult to consider inpatient rehabilitation services. Patient was felt to be a good candidate for inpatient rehabilitation services was admitted for comprehensive rehabilitation program 1/6; SLP clinical swallow and cognitive-linguistic evals completes 1/7. Pt appears grossly [facility] for basic and mildly complex cognitive tasks. He and his wife feel as though he is at his cognitive-linguistic baseline. Pt presents with mildly reduced left-sided strength and ROM in his articulators, resulting in mildly dysarthric speech and mild oral dysphagia. Pt would benefit from SLP services to maximize communication and swallowing safety.    SLP Assessment  Patient will need skilled Speech Lanaguage Pathology Services during CIR admission    Recommendations  Diet Recommendations: Dysphagia 3 (Mechanical Soft);Thin liquid Liquid Administration via: Cup;Straw Medication Administration: Whole meds with liquid Supervision: Patient able to self feed;Full supervision/cueing for compensatory strategies Compensations: Slow rate;Small sips/bites;Check for pocketing;Check for anterior loss Postural Changes and/or Swallow Maneuvers: Seated upright 90 degrees;Upright 30-60 min after meal Oral Care Recommendations: Oral care BID Patient destination: Home Follow up Recommendations: None    SLP Frequency 5 out of 7 days   SLP Treatment/Interventions Cueing hierarchy;Dysphagia/aspiration precaution training;Oral motor exercises;Patient/family education;Therapeutic Activities;Therapeutic Exercise    Pain Pain Assessment Pain Assessment: No/denies pain Prior Functioning Cognitive/Linguistic Baseline: Within functional limits Type of Home: House  Lives With:  Spouse Available Help at Discharge: Family;Available 24 hours/day Vocation: Full time employment  Short Term Goals: Week 1: SLP Short Term Goal 1 (Week 1): Pt will consume Dys 3  textures and thin liquids with supervision level cues for utilization of safe swallowing strategies SLP Short Term Goal 2 (Week 1): Pt will demonstrate adequate masticaiton and oral clearance with regular textures with supervision to assess readiness for diet advancement SLP Short Term Goal 3 (Week 1): Pt will adequately perform oral motor exercises with supervision level cues SLP Short Term Goal 4 (Week 1): Pt will utilize speech intelligibility strategies at the sentence level with supervision level cues   See FIM for current functional status Refer to Care Plan for Long Term Goals  Recommendations for other services: None  Discharge Criteria: Patient will be discharged from SLP if patient refuses treatment 3 consecutive times without medical reason, if treatment goals not met, if there is a change in medical status, if patient makes no progress towards goals or if patient is discharged from hospital.  The above assessment, treatment plan, treatment alternatives and goals were discussed and mutually agreed upon: by patient and by family   Germain Osgood, M.A. CCC-SLP [phone]   Germain Osgood 06/08/2013, 4:06 PM

## 2013-06-08 NOTE — Evaluation (Signed)
Physical Therapy Assessment and Plan  Patient Details  Name: Donald [name] MRN: [id number] Date of Birth: 1946/02/01  PT Diagnosis: Abnormal posture, Difficulty walking, Edema, Hemiparesis non-dominant, Impaired sensation and Muscle weakness Rehab Potential: Excellent ELOS: 25 days   Today's Date: 06/08/2013 Time: 1000-1100 Time Calculation (min): 60 min  Problem List:  Patient Active Problem List   Diagnosis Date Noted  . Small vessel disease 06/07/2013  . Obesity, unspecified 06/07/2013  . CVA (cerebral infarction) 06/07/2013  . large right middle cerebral artery infarct, embolic 05/39/7673  . Hypertension 06/03/2013  . Diabetes 06/03/2013    Past Medical History:  Past Medical History  Diagnosis Date  . Hypertension   . Diabetes mellitus without complication   . Hyperlipemia   . large right middle cerebral artery infarct, embolic 08/31/9377    a. s/p IV tPA, b. source unknown, c. loop recorder placed   Past Surgical History:  Past Surgical History  Procedure Laterality Date  . Tee without cardioversion N/A 06/07/2013    Procedure: TRANSESOPHAGEAL ECHOCARDIOGRAM (TEE);  Surgeon: [name], MD;  Location: [facility] ENDOSCOPY;  Service: Cardiovascular;  Laterality: N/A;    Assessment & Plan Clinical Impression: 68 y.o. right-handed male with history of hypertension as well as diabetes mellitus. Patient is from [place] and was visiting family in [place]. Admitted 06/03/2013 of left-sided weakness and slurred speech. 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 Echocardiogram with ejection fraction 02% grade 1 diastolic dysfunction. CT angiogram head and neck with no proximal branch occlusion or stenosis. Patient did receive TPA. Neurology services consulted placed on Plavix for CVA prophylaxis. TEE showed no PFO or thrombus loop  recorder just implanted to rule out arrhythmia as potential cause. 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 Patient transferred to CIR on 06/07/2013 .   Patient currently requires total with mobility secondary to muscle weakness, decreased attention to left and decreased motor planning and decreased sitting balance, decreased standing balance, decreased postural control, hemiplegia and decreased balance strategies.  Prior to hospitalization, patient was independent  with mobility and lived with Spouse (Plans to d/c to daughter's home) in a House home.  Home access is 2-3 steps at home in [place], daughter's house in [place] is level entryLevel entry.  Patient will benefit from skilled PT intervention to maximize safe functional mobility, minimize fall risk and decrease caregiver burden for planned discharge home with 24 hour assist.  Anticipate patient will benefit from follow up [facility] at discharge.  PT - End of Session Endurance Deficit: No PT Assessment Rehab Potential: Excellent PT Patient demonstrates impairments in the following area(s): Balance;Edema;Motor;Perception;Sensory PT Transfers Functional Problem(s): Bed Mobility;Bed to Chair;Car;Furniture;Floor PT Locomotion Functional Problem(s): Ambulation;Wheelchair Mobility;Stairs PT Plan PT Intensity: Minimum of 1-2 x/day ,45 to 90 minutes PT Frequency: 5 out of 7 days PT Duration Estimated Length of Stay: 25 days PT Treatment/Interventions: Ambulation/gait training;Balance/vestibular training;Community reintegration;Discharge planning;DME/adaptive equipment instruction;Functional electrical stimulation;Functional mobility training;Neuromuscular re-education;Pain management;Patient/family education;Splinting/orthotics;Stair training;Therapeutic Activities;Therapeutic Exercise;UE/LE Strength taining/ROM;UE/LE Coordination activities;Wheelchair propulsion/positioning PT Transfers Anticipated Outcome(s):  Min A PT Locomotion Anticipated Outcome(s): Min A ambulation, mod I wheelchair PT Recommendation Follow Up Recommendations: Home health PT Patient destination: Home Equipment Recommended: Wheelchair (measurements);Wheelchair cushion (measurements) (Assistive device TBD)  Skilled Therapeutic Intervention Pt received lying in bed with nursing present. Pt seen for PT evaluation, details below. Pt demonstrates absent sensation LLE, significantly impaired  sensation LUE. Max-total A for mobility, tends to lean to L (lean increases with mobility). Pt intermittently aware of L lateral lean, otherwise requires verbal cues to correct. Discussed pt's goals, pt stated "I want to get healthy and do what I used to be able do." Pt left seated in w/c with needs in reach.   PT Evaluation Precautions/Restrictions Precautions Precautions: Fall Restrictions Weight Bearing Restrictions: No General   Vital SignsTherapy Vitals Pulse Rate: 77 BP: 142/90 mmHg Patient Position, if appropriate: Sitting Pain Pain Assessment Pain Assessment: No/denies pain Home Living/Prior Functioning Home Living Available Help at Discharge: Family;Available 24 hours/day Type of Home: House Home Access: Level entry Entrance Stairs-Number of Steps: Daughter's house in [place] is level entry Home Layout: Two level (Bedrooms on 2nd level, able to stay in den on 1st floor) Alternate Level Stairs-Number of Steps: Daughter's house in [place] and bathroom upstairs Additional Comments: Plan is to move closer to [place] but exact plan is still being worked out. 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, required some  travel Leisure: Hobbies-yes (Comment) (golf) Vision/Perception  Vision - History Baseline Vision: Wears glasses all the time Patient Visual Report: No change from baseline Vision - Assessment Eye Alignment: Within Functional Limits Vision Assessment: Vision not tested Perception Perception: Impaired Inattention/Neglect:  (mild L inattention)  Cognition Overall Cognitive Status: Within Functional Limits for tasks assessed Arousal/Alertness: Awake/alert Orientation Level: Oriented X4 Memory: Appears intact Awareness: Appears intact Safety/Judgment: Appears intact Sensation Sensation Light Touch: Impaired Detail Light Touch Impaired Details: Absent LLE;Impaired LUE Stereognosis: Not tested Hot/Cold: Impaired Detail Hot/Cold Impaired Details: Impaired LLE Proprioception: Impaired by gross assessment Coordination Gross Motor Movements are Fluid and Coordinated: No Fine Motor Movements are Fluid and Coordinated: No Coordination and Movement Description: flaccid LUE, minimal volitional movement LLE Finger Nose Finger Test: WFL RUE, unable to test LUE Heel Shin Test: [facility] RLE, unable to assess LLE Motor  Motor Motor: Hemiplegia;Abnormal postural alignment and control Motor - Skilled Clinical Observations: L hemiparesis, tends to lean/drift to L in sitting and standing  Mobility Bed Mobility Bed Mobility: Supine to Sit;Sitting - Scoot to Edge of Bed Supine to Sit: 2: Max assist Sitting - Scoot to [employer] of Bed: 2: Max assist Transfers Transfers: Yes Sit to Stand: 1: +2 Total assist, initially attempted pushing off armrest with RUE but unsuccessful. Achieved standing +2 "three musketeers" positioning. Verbal cues and manual facilitation for weight shifting toward R (pt drifts toward L) and upright trunk/hip extension. Blocking at L knee to prevent buckling.  Stand to Sit: 1: +2 Total assist Lateral/Scoot Transfers: 1: +2 Total assist;With armrests removed Lateral/Scoot Transfer  Details (indicate cue type and reason): Pt tends to lean to L when attempting to scoot, verbal cues for head/hips relationship Locomotion  Ambulation Ambulation: No Ambulation/Gait Assistance: Not tested (comment) Gait Gait: No Stairs / Additional Locomotion Stairs: No Wheelchair Mobility Wheelchair Mobility: No (Unable to assess use of hemi technique 2/2 height of w/c)  Trunk/Postural Assessment  Cervical Assessment Cervical Assessment: Exceptions to [facility] (Forward head posture) Thoracic Assessment Thoracic Assessment: Exceptions to [facility] (increased thoracic kyphosis, protracted scapula) Lumbar Assessment Lumbar Assessment: Within Functional Limits Postural Control Postural Control: Deficits on evaluation (Leans to L (increased with mobility))  Balance Static  Sitting Balance Static Sitting - Balance Support: Right upper extremity supported;Feet supported Static Sitting - Level of Assistance: 3: Mod assist Static Sitting - Comment/# of Minutes: Tends to lean L, able to self correct intermittently with verbal cues to bring trunk toward R, experienced single posterior LOB Extremity Assessment  RUE Assessment RUE Assessment: Within Functional Limits LUE Assessment LUE Assessment: Exceptions to [facility] (some thumb extension, 0/5 shoulder) RLE Assessment RLE Assessment: Within Functional Limits LLE Assessment LLE Assessment: Exceptions to [facility] (Some toe flexion, 2/5 hip adduction and knee flexion)  FIM:  FIM - Bed/Chair Transfer Bed/Chair Transfer Assistive Devices: HOB elevated;Bed rails Bed/Chair Transfer: 2: Supine > Sit: Max A (lifting assist/Pt. 25-49%);1: Two helpers FIM - Locomotion: Glass blower/designer: Wheelchair: 1: Total Assistance/staff pushes wheelchair (Pt<25%) FIM - Locomotion: Ambulation Ambulation/Gait Assistance: Not tested (comment) Locomotion: Ambulation: 0: Activity did not occur FIM - Locomotion: Stairs Locomotion: Stairs: 0: Activity did not occur   Refer to  Care Plan for Long Term Goals  Recommendations for other services: None  Discharge Criteria: Patient will be discharged from PT if patient refuses treatment 3 consecutive times without medical reason, if treatment goals not met, if there is a change in medical status, if patient makes no progress towards goals or if patient is discharged from hospital.  The above assessment, treatment plan, treatment alternatives and goals were discussed and mutually agreed upon: by patient  [facility], [facility] 06/08/2013, 11:36 AM

## 2013-06-08 NOTE — Progress Notes (Signed)
Social Work Patient ID: Donald [name], male   DOB: 1945-10-16, 68 y.o.   MRN: [id number] Met with pt and wife to inform of team conference goals-min level and targeted discharge 1/29.  Both are encouraged he is on rehab and hopeful he will make  Good progress while here.

## 2013-06-08 NOTE — Progress Notes (Signed)
Hypoglycemic Event  CBG:63 at 1636  Treatment: 15 GM carbohydrate snack  Symptoms: None  Follow-up CBG: Time:1700 CBG Result:85  Possible Reasons for Event: Unknown  Comments/MD notified:[name], PA    Nolon Rod S  Remember to initiate Hypoglycemia Order Set & complete

## 2013-06-08 NOTE — Progress Notes (Signed)
68 y.o. right-handed male with history of hypertension as well as diabetes mellitus. Patient is from [place] and was visiting family in [place]. Admitted 06/03/2013 of left-sided weakness and slurred speech. 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 Echocardiogram with ejection fraction 24% grade 1 diastolic dysfunction. CT angiogram head and neck with no proximal branch occlusion or stenosis. Patient did receive TPA. Neurology services consulted placed on Plavix for CVA prophylaxis. TEE showed no PFO or thrombus loop recorder just implanted to rule out arrhythmia as potential cause. Acute on chronic paranasal sinusitis identified on MRI of the brain placed on amoxicillin  Subjective/Complaints: Some problems with positioning in bed last noc , also constipatied  Review of Systems - Negative except weakness on Left and as above  Objective: Vital Signs: Blood pressure 186/81, pulse 62, temperature 97.5 F (36.4 C), temperature source Oral, resp. rate 18, height 6\' 3"  (1.905 m), weight 121.564 kg (268 lb), SpO2 98.00%. No results found. Results for orders placed during the hospital encounter of 06/07/13 (from the past 72 hour(s))  GLUCOSE, CAPILLARY     Status: Abnormal   Collection Time    06/07/13  8:55 PM      Result Value Range   Glucose-Capillary 133 (*) 70 - 99 mg/dL     HEENT: normal Cardio: RRR and no murmur Resp: CTA B/L and unlabored GI: BS positive and non distended Extremity:  Pulses positive and No Edema Skin:   Intact Neuro: Alert/Oriented, Cranial Nerve Abnormalities left central 7, Abnormal Sensory absent left hand and foot, Abnormal Motor trace Left shoulder add, Dysarthric and Other poor lip closure on Left Musc/Skel:  Other Left shoulder sublux Gen NAD   Assessment/Plan: 1. Functional deficits secondary to R MCA embolic  infarct  which require 3+ hours per day of interdisciplinary therapy in a comprehensive inpatient rehab setting. Physiatrist is providing close team supervision and 24 hour management of active medical problems listed below. Physiatrist and rehab team continue to assess barriers to discharge/monitor patient progress toward functional and medical goals. FIM:                   Comprehension Comprehension Mode: Auditory Comprehension: 5-Understands complex 90% of the time/Cues < 10% of the time  Expression Expression Mode: Verbal Expression: 5-Expresses complex 90% of the time/cues < 10% of the time  Social Interaction Social Interaction: 7-Interacts appropriately with others - No medications needed.  Problem Solving Problem Solving: 5-Solves complex 90% of the time/cues < 10% of the time  Memory Memory: 7-Complete Independence: No helper  Medical Problem List and Plan:  1. 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 Loop recorder implanted 06/07/13.  2. DVT Prophylaxis/Anticoagulation: SCDs. Monitor for any signs of DVT  3. Pain Management: Tylenol as needed for  4. Neuropsych: This patient is capable of making decisions on his own behalf.  5. Hypertension. Norvasc 10 mg daily, Coreg 12.5 mg twice a day, Catapres 0.3 mg twice a day, lisinopril 40 mg daily. Monitor with increased mobility  6. Diabetes mellitus with peripheral neuropathy. Hemoglobin A1c 6.2. DiaBeta 5 mg daily, Glucophage 1500 mg daily. Check blood sugars a.c. and at bedtime. Education by team  7. Sinusitis identified on MRI. Amoxicillin 06/03/2013 x5 days  LOS (Days) 1 A FACE TO FACE EVALUATION WAS PERFORMED  KIRSTEINS,ANDREW E 06/08/2013, 6:47 AM

## 2013-06-08 NOTE — Evaluation (Signed)
Occupational Therapy Assessment and Plan  Patient Details  Name: Donald [name] MRN: [id number] Date of Birth: 04-04-1946  OT Diagnosis: abnormal posture, flaccid hemiplegia and hemiparesis, hemiplegia affecting non-dominant side and muscle weakness (generalized) Rehab Potential: Rehab Potential: Good ELOS: 24-28 days   Today's Date: 06/08/2013 Time: 6503-5465 Time Calculation (min): 60 min  Problem List:  Patient Active Problem List   Diagnosis Date Noted  . Small vessel disease 06/07/2013  . Obesity, unspecified 06/07/2013  . CVA (cerebral infarction) 06/07/2013  . large right middle cerebral artery infarct, embolic 68/05/7516  . Hypertension 06/03/2013  . Diabetes 06/03/2013    Past Medical History:  Past Medical History  Diagnosis Date  . Hypertension   . Diabetes mellitus without complication   . Hyperlipemia   . large right middle cerebral artery infarct, embolic 0/0/1749    a. s/p IV tPA, b. source unknown, c. loop recorder placed   Past Surgical History:  Past Surgical History  Procedure Laterality Date  . Tee without cardioversion N/A 06/07/2013    Procedure: TRANSESOPHAGEAL ECHOCARDIOGRAM (TEE);  Surgeon: [name], MD;  Location: [facility] ENDOSCOPY;  Service: Cardiovascular;  Laterality: N/A;    Assessment & Plan Clinical Impression: Patient is a 68 y.o. right-handed male with history of hypertension as well as diabetes mellitus. Patient is from [place] and was visiting family in [place]. Admitted 06/03/2013 of left-sided weakness and slurred speech. 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 Echocardiogram with ejection fraction 44% grade 1 diastolic dysfunction. CT angiogram head and neck with no proximal branch occlusion or stenosis. Patient did receive TPA. Neurology services consulted placed on Plavix for CVA  prophylaxis. TEE showed no PFO or thrombus loop recorder just implanted to rule out arrhythmia as potential cause. 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 Physical and occupational therapy evaluations completed 06/05/2013 with recommendations for physical medicine rehabilitation consult to consider inpatient rehabilitation services. Patient was felt to be a good candidate for inpatient rehabilitation services.   Patient transferred to CIR on 06/07/2013 .    Patient currently requires total with basic self-care skills secondary to muscle weakness, abnormal tone, unbalanced muscle activation, decreased coordination and decreased motor planning, decreased midline orientation and decreased sitting balance, decreased standing balance, decreased postural control and hemiplegia.  Prior to hospitalization, patient could complete ADLs with independent .  Patient will benefit from skilled intervention to decrease level of assist with basic self-care skills, increase independence with basic self-care skills and increase level of independence with iADL prior to discharge home with care partner.  Anticipate patient will require intermittent supervision and minimal physical assistance and follow up home health and follow up outpatient.  OT - End of Session Activity Tolerance: Tolerates 30+ min activity without fatigue Endurance Deficit: No OT Assessment Rehab Potential: Good OT Patient demonstrates impairments in the following area(s): Balance;Motor;Perception;Sensory OT Basic ADL's Functional Problem(s): Grooming;Bathing;Dressing;Toileting OT Advanced ADL's Functional Problem(s): Simple Meal Preparation OT Transfers Functional Problem(s): Toilet;Tub/Shower OT Additional Impairment(s): Fuctional Use of Upper Extremity OT Plan OT Intensity: Minimum of 1-2 x/day, 45 to 90 minutes OT Frequency: 5 out of 7 days OT Duration/Estimated Length of Stay: 24-28 days OT  Treatment/Interventions: Balance/vestibular training;Discharge planning;DME/adaptive equipment instruction;Functional mobility training;Neuromuscular re-education;Pain management;Patient/family education;Psychosocial support;Self Care/advanced ADL retraining;Therapeutic Activities;Therapeutic Exercise;UE/LE Strength taining/ROM;UE/LE Coordination activities OT Self Feeding Anticipated Outcome(s): mod I OT Basic Self-Care Anticipated Outcome(s):  min assist OT Toileting Anticipated Outcome(s): min assist OT Bathroom Transfers Anticipated Outcome(s): min assist OT Recommendation Patient destination: Home Follow Up Recommendations: Home health OT;Outpatient OT;24 hour supervision/assistance Equipment Recommended: 3 in 1 bedside comode;To be determined   Skilled Therapeutic Intervention OT eval initiated, ADL assessment conducted at EOB.  Pt in bed upon arrival, reports need to have BM.  Performed squat pivot transfer to drop arm BSC with +2 assist for transfer secondary to decreased midline orientation, LLE weakness, and overall size.  Educated on weight shift forward and opposite direction of transfer to unweight hip in preparation for transfer.  Pt attempted to recall hemi-dressing technique from acute but reported dress good side first, reeducated on dressing effected side first and when undressing to undress stronger side first.  Pt unable to lift LLE when seated EOB requiring assist to thread Lt underwear and pant leg.  Pt with decreased sitting balance with drift to Lt during unsupported sitting requiring cues to weight shift back to Rt, pt with decreased awareness of balance issues.  Pt returned to supine in bed with mod assist to lift BLE, educated on using RLE to assist in positioning LLE.  OT Evaluation Precautions/Restrictions  Precautions Precautions: Fall Restrictions Weight Bearing Restrictions: No General   Vital Signs Therapy Vitals Temp: 97.5 F (36.4 C) Temp src: Oral Pulse  Rate: 62 Resp: 18 BP: 186/81 mmHg Patient Position, if appropriate: Lying Oxygen Therapy SpO2: 98 % O2 Device: None (Room air) Pain Pain Assessment Pain Assessment: No/denies pain Home Living/Prior Functioning Home Living Available Help at Discharge: Family;Available 24 hours/day Type of Home: House Home Access: Stairs to enter [employer] of Steps: 2-3 steps at home in [place], daughter's house in [place] is level entry Entrance Stairs-Rails: Right;Left Home Layout: Two level;Able to live on main level with bedroom/bathroom Alternate Level Stairs-Number of Steps: able to live on main level in home in [place], daughter's house in [place] bedroom and bathroom upstairs Additional Comments: house in [place] is pending sale 07/25/13.  Plan is to move closer to [facility] but exact plan is still being worked out. 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, required some travel ADL  See FIM Vision/Perception  Vision - History Baseline Vision: Wears glasses all the time Patient Visual Report: No change from baseline Vision - Assessment Eye Alignment: Within Functional Limits Vision Assessment: Vision not tested  Cognition Overall Cognitive Status: Within Functional Limits for tasks assessed Arousal/Alertness: Awake/alert Orientation Level: Oriented X4 Memory: Appears intact Awareness: Appears intact Sensation Sensation Light Touch: Impaired Detail Light Touch Impaired Details: Impaired LUE Stereognosis: Not tested Hot/Cold: Not tested Proprioception: Impaired by gross assessment Coordination Gross Motor  Movements are Fluid and Coordinated: No Fine Motor Movements are Fluid and Coordinated: No Coordination and Movement Description: flaccid LUE Extremity/Trunk Assessment RUE Assessment RUE Assessment: Within Functional Limits LUE Assessment LUE Assessment: Exceptions to [facility] (trace bicep/tricep. thumb extension, 0/5 otherwise)  FIM:  FIM - Grooming Grooming Steps: Wash, rinse, dry face Grooming: 2: Patient completes 1 of 4 or 2 of 5 steps FIM - Bathing Bathing Steps Patient Completed: Chest;Left Arm;Abdomen Bathing: 2: Max-Patient completes 3-4 14f10 parts or 25-49% FIM - Upper Body Dressing/Undressing Upper body dressing/undressing steps patient completed:  Thread/unthread right sleeve of pullover shirt/dresss Upper body dressing/undressing: 2: Max-Patient completed 25-49% of tasks FIM - Lower Body Dressing/Undressing Lower body dressing/undressing steps patient completed: Thread/unthread right underwear leg;Thread/unthread right pants leg Lower body dressing/undressing: 1: Two helpers FIM - Toileting Toileting steps completed by patient: Performs perineal hygiene Toileting: 1: Two helpers FIM - Control and instrumentation engineer Devices: HOB elevated;Bed rails Bed/Chair Transfer: 3: Supine > Sit: Mod A (lifting assist/Pt. 50-74%/lift 2 legs;1: Bed > Chair or W/C: Total A (helper does all/Pt. < 25%);1: Chair or W/C > Bed: Total A (helper does all/Pt. < 25%);1: Two helpers;3: Sit > Supine: Mod A (lifting assist/Pt. 50-74%/lift 2 legs) FIM - Radio producer Devices: Bedside commode Toilet Transfers: 2-To toilet/BSC: Max A (lift and lower assist);2-From toilet/BSC: Max A (lift and lower assist);1-Two helpers FIM - Camera operator Transfers: 0-Activity did not occur or was simulated   Refer to Care Plan for Long Term Goals  Recommendations for other services: None  Discharge Criteria: Patient will be discharged from OT if  patient refuses treatment 3 consecutive times without medical reason, if treatment goals not met, if there is a change in medical status, if patient makes no progress towards goals or if patient is discharged from hospital.  The above assessment, treatment plan, treatment alternatives and goals were discussed and mutually agreed upon: by patient  Simonne Come 06/08/2013, 9:54 AM

## 2013-06-08 NOTE — Patient Care Conference (Signed)
Inpatient RehabilitationTeam Conference and Plan of Care Update Date: 06/08/2013   Time: 11;15 am    Patient Name: Donald [name]      Medical Record Number: [id number]  Date of Birth: 1945-06-11 Sex: Male         Room/Bed: 9J09T/2I71I-45 Payor Info: Payor: MEDICARE / Plan: MEDICARE PART A AND B / Product Type: *No Product type* /    Admitting Diagnosis: CVA  Admit Date/Time:  06/07/2013  6:02 PM Admission Comments: No comment available   Primary Diagnosis:  CVA (cerebral infarction) Principal Problem: CVA (cerebral infarction)  Patient Active Problem List   Diagnosis Date Noted  . Small vessel disease 06/07/2013  . Obesity, unspecified 06/07/2013  . CVA (cerebral infarction) 06/07/2013  . large right middle cerebral artery infarct, embolic 80/99/8338  . Hypertension 06/03/2013  . Diabetes 06/03/2013    Expected Discharge Date: Expected Discharge Date: 06/30/13  Team Members Present: Physician leading conference: Dr. Alysia Penna Social Worker Present: Ovidio Kin, LCSW Nurse Present: Heather Roberts, RN PT Present: Raylene Everts, PT;Emily Parcell, PT OT Present: Antony Salmon, Jules Schick, OT PPS Coordinator present : Daiva Nakayama, RN, CRRN     Current Status/Progress Goal Weekly Team Focus  Medical   Constipation, severe weakness in RUE adn RLE as wellas Poor sensation  upgrae strength, regulate bowels  initiate therapy program, adjust meds   Bowel/Bladder   continent bowel and bladder loose bm this am LBM 1-07  mod I  monitor    Swallow/Nutrition/ Hydration   Dys 3 textures and thin liquids, intermittent supervision  Mod I  trials of regular textures, increase utilization of swallowing strategies   ADL's   max assist bathing and UB dressing, +2 LB dressing and squat pivot transfer to toilet, max assist sit <> stand  min assist overall  sitting and standing balance, transfers, self-care retraining   Mobility   Min-mod A sitting balance, max A bed mobility, +2  transfers  Min A overall  Sitting balance, transfers, standing, LE NMR    Communication   mildly dysarthric speech  Mod I  utilization of speech intelligibility strategies   Safety/Cognition/ Behavioral Observations    No unsafe behaviors        Pain   tylenol 650 mg po prn every 4 hous   less than 3  monitor effectiveness of pain meds    Skin   dressing to loop recorder   now new breakdown   monitor skin and educate family on skin care       *See Care Plan and progress notes for long and short-term goals.  Barriers to Discharge: heavy assist level, large pt    Possible Resolutions to Barriers:  cont rehab to upgrade to min A    Discharge Planning/Teaching Needs:    Home with wife providing the care-re-locating here and are making arrangements of where and the plan at discharge.     Team Discussion:  Goals-min level-poor sensation, r-gaze pref.  Loop recorder placed yesterday.  Evaluating in all areas  Revisions to Treatment Plan:  New eval   Continued Need for Acute Rehabilitation Level of Care: The patient requires daily medical management by a physician with specialized training in physical medicine and rehabilitation for the following conditions: Daily direction of a multidisciplinary physical rehabilitation program to ensure safe treatment while eliciting the highest outcome that is of practical value to the patient.: Yes Daily medical management of patient stability for increased activity during participation in an intensive rehabilitation regime.: Yes Daily  analysis of laboratory values and/or radiology reports with any subsequent need for medication adjustment of medical intervention for : Neurological problems;Other  [name] 06/10/2013, 8:38 AM

## 2013-06-08 NOTE — Evaluation (Signed)
I have reviewed and I agree with the following eval/treatment note.  Ura Yingling Hall, PT, DPT  

## 2013-06-08 NOTE — Care Management Note (Signed)
[facility] Individual Statement of Services  Patient Name:  Donald [name]  Date:  06/08/2013  Welcome to the [facility].  Our goal is to provide you with an individualized program based on your diagnosis and situation, designed to meet your specific needs.  With this comprehensive rehabilitation program, you will be expected to participate in at least 3 hours of rehabilitation therapies Monday-Friday, with modified therapy programming on the weekends.  Your rehabilitation program will include the following services:  Physical Therapy (PT), Occupational Therapy (OT), Speech Therapy (ST), 24 hour per day rehabilitation nursing, Neuropsychology, Case Management (Social Worker), Rehabilitation Medicine, Nutrition Services and Pharmacy Services  Weekly team conferences will be held on Wednesday to discuss your progress.  Your Social Worker will talk with you frequently to get your input and to update you on team discussions.  Team conferences with you and your family in attendance may also be held.  Expected length of stay: 3 weeks Overall anticipated outcome: min level  Depending on your progress and recovery, your program may change. Your Social Worker will coordinate services and will keep you informed of any changes. Your Social Worker's name and contact numbers are listed  below.  The following services may also be recommended but are not provided by the [facility] will be made to provide these services after discharge if needed.  Arrangements include referral to agencies that provide these services.  Your insurance has been verified to be:  [place] primary doctor is:  Dr in [place]  Pertinent information will be shared with your doctor and your insurance  company.  Social Worker:  Ovidio Kin, [place] or ([phone]  Information discussed with and copy given to patient by: [name], 06/08/2013, 1:20 PM

## 2013-06-08 NOTE — Progress Notes (Signed)
Patient information reviewed and entered into eRehab system by Lark Langenfeld, RN, CRRN, PPS Coordinator.  Information including medical coding and functional independence measure will be reviewed and updated through discharge.     Per nursing patient was given "Data Collection Information Summary for Patients in Inpatient Rehabilitation Facilities with attached "Privacy Act Statement-Health Care Records" upon admission.  

## 2013-06-08 NOTE — Progress Notes (Signed)
Occupational Therapy Session Note  Patient Details  Name: Donald [name] MRN: [id number] Date of Birth: [birth year]  Today's Date: 06/08/2013 Time: 1140-1200 Time Calculation (min): 20 min  Short Term Goals: Week 1:  OT Short Term Goal 1 (Week 1): Pt will maintain midline sitting balance at EOB for 2 mins with supevision while completing self-care task OT Short Term Goal 2 (Week 1): Pt will complete bathing with max assist of one caregiver OT Short Term Goal 3 (Week 1): Pt will complete UB dressing with mod assist OT Short Term Goal 4 (Week 1): Pt will complete LB dressing with max assist of one caregiver OT Short Term Goal 5 (Week 1): Pt will complete toilet transfer squat pivot with max assist of one caregiver  Skilled Therapeutic Interventions/Progress Updates:  Pt seen for 1:1 OT with education on NM re-ed and provided with handout for shoulder elevation/drepression and shoulder retraction/protraction.  Pt attempted each activity with overcompensation of trunk, unable to isolate shoulder movement from trunk movement at this time.  Encouraged pt to participate in these exercises while seated in w/c.  Pt extremely motivated and willing to participate in any activities during his downtime to promote progress.  Therapy Documentation Precautions:  Precautions Precautions: Fall Restrictions Weight Bearing Restrictions: No General:   Vital Signs: Therapy Vitals Pulse Rate: 77 BP: 142/90 mmHg Patient Position, if appropriate: Sitting Pain: Pain Assessment Pain Assessment: No/denies pain  See FIM for current functional status  Therapy/Group: Individual Therapy  Simonne Come 06/08/2013, 12:19 PM

## 2013-06-08 NOTE — Progress Notes (Signed)
Social Work Assessment and Plan Social Work Assessment and Plan  Patient Details  Name: Donald [name] MRN: [id number] Date of Birth: [birth year]  Today's Date: 06/08/2013  Problem List:  Patient Active Problem List   Diagnosis Date Noted  . Small vessel disease 06/07/2013  . Obesity, unspecified 06/07/2013  . CVA (cerebral infarction) 06/07/2013  . large right middle cerebral artery infarct, embolic 88/50/2774  . Hypertension 06/03/2013  . Diabetes 06/03/2013   Past Medical History:  Past Medical History  Diagnosis Date  . Hypertension   . Diabetes mellitus without complication   . Hyperlipemia   . large right middle cerebral artery infarct, embolic 06/03/8784    a. s/p IV tPA, b. source unknown, c. loop recorder placed   Past Surgical History:  Past Surgical History  Procedure Laterality Date  . Tee without cardioversion N/A 06/07/2013    Procedure: TRANSESOPHAGEAL ECHOCARDIOGRAM (TEE);  Surgeon: [name], MD;  Location: [facility] ENDOSCOPY;  Service: Cardiovascular;  Laterality: N/A;   Social History:  reports that he has quit smoking. He has never used smokeless tobacco. He reports that he does not use illicit drugs. His alcohol history is not on file.  Family / Support Systems Marital Status: Married How Long?: 6 years Patient Roles: Spouse;Parent;Other (Comment) (Employee) Spouse/Significant Other: Phyillis  [phone] Children: Lamount Cranker [phone] Other Supports: Son in [place] Anticipated Caregiver: Wife Ability/Limitations of Caregiver: Wife has had knee replacements 2 years ago.  But otherswise is in good health Caregiver Availability: 24/7 Family Dynamics: Close knit family who are willing to do for each other.  Daughter is local and can assist, and pt and wife were planning to relocate here anyway.  Recently sold their home in [place].  Very supportive of one another.  Social History Preferred language: English Religion: None Cultural  Background: No issues Education: Secretary/administrator educated Read: Yes Write: Yes Employment Status: Employed Name of Employer: [employer] Return to Work Plans: Unsure at this time Freight forwarder Issues: No issues Guardian/Conservator: None-according to MD pt is capable of making his own decisions while here   Abuse/Neglect Physical Abuse: Denies Verbal Abuse: Denies Sexual Abuse: Denies Exploitation of patient/patient's resources: Denies Self-Neglect: Denies  Emotional Status Pt's affect, behavior adn adjustment status: Pt is motivated to [employer] and regain his independence.  Wife is here and very supportive and cheerleader for her husband.  Pt and wife feel their faith will pull them through and lead them where they need to be.  Both appear to be supporting one another. Recent Psychosocial Issues: Other health issues Pyschiatric History: No history deferred depression screen due to tired and eating lunch, but will re-visit this.  He relies upon his faith and feels this will get him through and his sheer determination.  May benefit from Neuro-psych seeing Substance Abuse History: No issues  Patient / Family Perceptions, Expectations & Goals Pt/Family understanding of illness & functional limitations: Pt and wife have a good understanding of his stroke and deficits.  Both are very hopeful he will do well here.  Wife is comparing her rehab of knees to her husband, which is no comparsion.  Will enocurage wife to attned therapies with pt to get a more realistic view of his progress and where he is starting from. Premorbid pt/family roles/activities: Husband, Father, grandfather, Home owner, Employee, [employer] member, etc Anticipated changes in roles/activities/participation: resume at discharge Pt/family expectations/goals: Pt states; " I want to be able to take care of myself, I certainly do not want to  burden my wife."  Wife states; " I will do whatever he needs me to do.  "  [employer]: None Premorbid Home Care/DME Agencies: None Transportation available at discharge: Wife Resource referrals recommended: Support group (specify) (CVA Support Group)  Discharge Planning Living Arrangements: Spouse/significant other Support Systems: Spouse/significant other;Children;Friends/neighbors;Church/faith community Type of Residence: Private residence Insurance underwriter Resources: Education officer, museum (specify) ([employer]) Museum/gallery curator Resources: Employment;Social Forensic scientist Screen Referred: No Living Expenses: Own Money Management: Patient;Spouse Does the patient have any problems obtaining your medications?: No Home Management: Wife Patient/Family Preliminary Plans: In the middle of a move and will need to plan for dishcarge, whether staying at daughter's or getting their own place.  Just solde their home in [place] and are relocating here when this happened. Wife to work on this while pt is here. Social Work Anticipated Follow Up Needs: HH/OP;Support Group  Clinical Impression Very pleasant talkative gentleman who is motivated and willing to put forth the effort to improve.  Supportive wife who is willing to do her part.  Many things need to be decided while here. Encouraged wife to attend therapies to see pt's baseline and progress while here.  Will work on a discharge plan.  Wife to go back and forth to take care of move from home.  [name] 06/08/2013, 1:37 PM

## 2013-06-09 ENCOUNTER — Inpatient Hospital Stay (HOSPITAL_COMMUNITY): Payer: Medicare Other | Admitting: Speech Pathology

## 2013-06-09 ENCOUNTER — Inpatient Hospital Stay (HOSPITAL_COMMUNITY): Payer: Medicare Other | Admitting: Occupational Therapy

## 2013-06-09 ENCOUNTER — Inpatient Hospital Stay (HOSPITAL_COMMUNITY): Payer: Medicare Other

## 2013-06-09 DIAGNOSIS — I6340 Cerebral infarction due to embolism of unspecified cerebral artery: Secondary | ICD-10-CM

## 2013-06-09 DIAGNOSIS — G8110 Spastic hemiplegia affecting unspecified side: Secondary | ICD-10-CM

## 2013-06-09 LAB — GLUCOSE, CAPILLARY
Glucose-Capillary: 165 mg/dL — ABNORMAL HIGH (ref 70–99)
Glucose-Capillary: 145 mg/dL — ABNORMAL HIGH (ref 70–99)
Glucose-Capillary: 77 mg/dL (ref 70–99)
Glucose-Capillary: 105 mg/dL — ABNORMAL HIGH (ref 70–99)

## 2013-06-09 NOTE — Progress Notes (Signed)
Speech Language Pathology Daily Session Note  Patient Details  Name: Donald [name] MRN: [id number] Date of Birth: [birth year]  Today's Date: 06/09/2013 Time: 1118-1203 Time Calculation (min): 45 min  Short Term Goals: Week 1: SLP Short Term Goal 1 (Week 1): Pt will consume Dys 3 textures and thin liquids with supervision level cues for utilization of safe swallowing strategies SLP Short Term Goal 2 (Week 1): Pt will demonstrate adequate masticaiton and oral clearance with regular textures with supervision to assess readiness for diet advancement SLP Short Term Goal 3 (Week 1): Pt will adequately perform oral motor exercises with supervision level cues SLP Short Term Goal 4 (Week 1): Pt will utilize speech intelligibility strategies at the sentence level with supervision level cues   Skilled Therapeutic Interventions: Skilled treatment session focused on addressing dysarthria and dysphagia goals.  SLP facilitated session with demonstration and Supervision level verbal cues to accurately complete oral motor exercises.  Patient provided with handout and asked to perform on his own throughout the day.  Patient was Mod I for intelligibility throughout session today with SLP who was an unfamiliar listener.  SLP also facilitated session with skilled observation of patient consuming Dys.3 textures and thin liquids with set-up assist and Supervision level verbal cues to recall and utilize recommended safe swallow strategies.  Recommend to continue with current plan of care.    FIM:  Comprehension Comprehension Mode: Auditory Comprehension: 5-Understands complex 90% of the time/Cues < 10% of the time Expression Expression Mode: Verbal Expression: 6-Expresses complex ideas: With extra time/assistive device Social Interaction Social Interaction: 7-Interacts appropriately with others - No medications needed. Problem Solving Problem Solving: 5-Solves complex 90% of the time/cues < 10% of the  time Memory Memory: 7-Complete Independence: No helper FIM - Eating Eating Activity: 5: Set-up assist for open containers;5: Supervision/cues  Pain Pain Assessment Pain Assessment: No/denies pain Pain Score: 0-No pain  Therapy/Group: Individual Therapy  Carmelia Roller., [place] [phone]  [place] 06/09/2013, 12:13 PM

## 2013-06-09 NOTE — Progress Notes (Signed)
I have read and agree with attached treatment note.  Becky Fe Okubo, PT 

## 2013-06-09 NOTE — Progress Notes (Signed)
Physical Therapy Session Note  Patient Details  Name: Donald [name] MRN: [id number] Date of Birth: 1946/05/30  Today's Date: 06/09/2013 Time: 9983-3825 Time Calculation (min): 65 min  Short Term Goals: Week 1:  PT Short Term Goal 1 (Week 1): Pt will perform sit<>supine with mod A PT Short Term Goal 2 (Week 1): Pt will transfer bed<>chair max A +1 PT Short Term Goal 3 (Week 1): Pt will perform sit<>stand max A +1  Skilled Therapeutic Interventions/Progress Updates:    Pt received seated in w/c. Pt propelled w/c utilizing hemi technique to therapy gym with supervision-min A. Pt required max verbal cues to attend to and navigate obstacles on L and min A for turning. Pt transferred w/c>mat to the R min A. Pt performed lateral leans down to elbow x5 R and L. Provided LUE support and mod progressing to min A returning to upright when leaning to L. Pt performed sit>supine mod A. Performed BLE exercise including dorsiflexion, quad set, knee flexion, and hip adduction x10. Delayed initiation noted all movements. Trace dorsiflexion and quad activation, increased active knee flexion and hip adduction activation noted as pt continued. Pt transferred supine>sit max A, initially attempting to sit straight up vs rolling to side and pushing up. Performed sit<>stand x2 max A (+2 for safety). Increased weightbearing through LLE and quad activation noted as pt continued to stand. Pt demonstrated significant L lean during standing. Pt transferred mat>w/c to R min A and w/c>recliner min A. Pt left seated in recliner with needs in reach and wife present.    Therapy Documentation Precautions:  Precautions Precautions: Fall Restrictions Weight Bearing Restrictions: No   Pain: Pain Assessment Pain Assessment: No/denies pain Locomotion : Wheelchair Mobility Distance: 150   See FIM for current functional status  Therapy/Group: Individual Therapy  [name], [place] 06/09/2013, 3:35 PM

## 2013-06-09 NOTE — Progress Notes (Signed)
Patient B/P 190/98. Immediately reported to the on call Reesa Chew, [place]. All the daily scheduled blood pressure medication given as per ordered. Tylenol 650 mg given for headache.Repositioned the patient.  Will continue to monitor.

## 2013-06-09 NOTE — Progress Notes (Signed)
Patient B/P 180/90. Clonidine 0.3 mg given. Reesa Chew, PA on call made aware. Patient is asymptomatic. Will continue to monitor.

## 2013-06-09 NOTE — Progress Notes (Signed)
Occupational Therapy Session Note  Patient Details  Name: Rawad Bochicchio MRN: [id number] Date of Birth: 1945-07-21  Today's Date: 06/09/2013 Time: 2778-2423 and 5361-4431 Time Calculation (min): 60 min and 30 min  Short Term Goals: Week 1:  OT Short Term Goal 1 (Week 1): Pt will maintain midline sitting balance at EOB for 2 mins with supevision while completing self-care task OT Short Term Goal 2 (Week 1): Pt will complete bathing with max assist of one caregiver OT Short Term Goal 3 (Week 1): Pt will complete UB dressing with mod assist OT Short Term Goal 4 (Week 1): Pt will complete LB dressing with max assist of one caregiver OT Short Term Goal 5 (Week 1): Pt will complete toilet transfer squat pivot with max assist of one caregiver  Skilled Therapeutic Interventions/Progress Updates:    1) Pt seen for ADL retraining with focus on dynamic sitting balance, static standing balance, and education on hemi-dressing technique.  Pt in bed finishing BM on bed pan upon arrival with RN and nurse tech assisting in hygiene and donning underwear in supine.  Engaged in bathing and dressing seated EOB with pt requiring mod verbal cues and mod assist to maintain midline sitting balance during bathing especially when lifting RLE for washing.  Pt with difficulty orienting t-shirt to don LUE, educated on various techniques to increase independence.  Sit > stand x2 to pull pants over hips with pt requiring max-total assist for transition and to maintain standing with blocking at Lt knee in static stance.  +2 for LB dressing secondary to pt requiring max-total for static standing and 2nd person pulling pants over hips.  Engaged in additional sit <> stand with focus on accepting weight through LLE to increase midline standing, pt tends to overcompensate with dominant RUE/RLE with sit > stand and when standing.  Engaged in multiple scoots to [place] in preparation for squat pivot transfer w/c with pt able to scoot with  min-mod assist.  +2 for squat pivot to Lt to w/c with this therapist stabilizing LLE in WB position and providing lifting assistance while 2nd person assisted with lifting and positioning.   2) Pt seen for 1:1 OT with focus on NM re-ed in unsupported sitting.  Performed squat pivot transfer w/c to therapy mat with +2 with focus on forward weight shifting.  Engaged in lateral weight shifting with weight bearing through Lt elbow with focus on trunk control and weight shifting back to midline, followed by weight bearing through Lt hand with manual facilitation to maintain proper positioning and WB while reaching across midline with RUE to promote weight shift.  Post WB activity, pt demonstrating horizontal adduction and shoulder internal rotation in gravity eliminated.  Pt encouraged by LUE movement post WB. Educated on self-ROM with focus on shoulder flexion and internal/external rotation.  Pt's wife present throughout session with appropriate questions and encouragement.  Therapy Documentation Precautions:  Precautions Precautions: Fall Restrictions Weight Bearing Restrictions: No General:   Vital Signs: Therapy Vitals Pulse Rate: 61 Resp: 18 BP: 147/80 mmHg Pain: Pain Assessment Pain Assessment: No/denies pain Pain Score: 0-No pain Pain Type: Acute pain Pain Location: Head Pain Orientation: Right Pain Descriptors / Indicators: Headache Pain Intervention(s): Medication (See eMAR)  See FIM for current functional status  Therapy/Group: Individual Therapy  Simonne Come 06/09/2013, 9:37 AM

## 2013-06-09 NOTE — Progress Notes (Signed)
68 y.o. right-handed male with history of hypertension as well as diabetes mellitus. Patient is from [place] and was visiting family in [place]. Admitted 06/03/2013 of left-sided weakness and slurred speech. 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 Echocardiogram with ejection fraction 76% grade 1 diastolic dysfunction. CT angiogram head and neck with no proximal branch occlusion or stenosis. Patient did receive TPA. Neurology services consulted placed on Plavix for CVA prophylaxis. TEE showed no PFO or thrombus loop recorder just implanted to rule out arrhythmia as potential cause. 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  R hand abrasion after tape removal, itching Left elbow  Review of Systems - Negative except weakness on Left and as above  Objective: Vital Signs: Blood pressure 147/80, pulse 61, temperature 98.1 F (36.7 C), temperature source Oral, resp. rate 18, height 6' 3"  (1.905 m), weight 121.564 kg (268 lb), SpO2 98.00%. No results found. Results for orders placed during the hospital encounter of 06/07/13 (from the past 72 hour(s))  GLUCOSE, CAPILLARY     Status: Abnormal   Collection Time    06/07/13  8:55 PM      Result Value Range   Glucose-Capillary 133 (*) 70 - 99 mg/dL  CBC WITH DIFFERENTIAL     Status: Abnormal   Collection Time    06/08/13  6:10 AM      Result Value Range   WBC 7.8  4.0 - 10.5 K/uL   Comment: WHITE COUNT CONFIRMED ON SMEAR   RBC 4.47  4.22 - 5.81 MIL/uL   Hemoglobin 14.9  13.0 - 17.0 g/dL   HCT 41.3  39.0 - 52.0 %   MCV 92.4  78.0 - 100.0 fL   MCH 33.3  26.0 - 34.0 pg   MCHC 36.1 (*) 30.0 - 36.0 g/dL   RDW 14.0  11.5 - 15.5 %   Platelets 191  150 - 400 K/uL   Comment: PLATELET COUNT CONFIRMED BY SMEAR    Neutrophils Relative % 59  43 - 77 %   Lymphocytes Relative 25  12 - 46 %   Monocytes Relative 12  3 - 12 %   Eosinophils Relative 3  0 - 5 %   Basophils Relative 1  0 - 1 %   Neutro Abs 4.6  1.7 - 7.7 K/uL   Lymphs Abs 2.0  0.7 - 4.0 K/uL   Monocytes Absolute 0.9  0.1 - 1.0 K/uL   Eosinophils Absolute 0.2  0.0 - 0.7 K/uL   Basophils Absolute 0.1  0.0 - 0.1 K/uL   Smear Review MORPHOLOGY UNREMARKABLE    COMPREHENSIVE METABOLIC PANEL     Status: Abnormal   Collection Time    06/08/13  6:10 AM      Result Value Range   Sodium 137  137 - 147 mEq/L   Potassium 4.0  3.7 - 5.3 mEq/L   Comment: HEMOLYSIS AT THIS LEVEL MAY AFFECT RESULT   Chloride 101  96 - 112 mEq/L   CO2 18 (*) 19 - 32 mEq/L   Glucose, Bld 131 (*) 70 - 99 mg/dL   BUN 13  6 - 23 mg/dL   Creatinine, Ser 0.74  0.50 - 1.35 mg/dL   Calcium 8.7  8.4 - 10.5 mg/dL   Total Protein 6.9  6.0 - 8.3 g/dL  Albumin 3.4 (*) 3.5 - 5.2 g/dL   AST 35  0 - 37 U/L   Comment: HEMOLYSIS AT THIS LEVEL MAY AFFECT RESULT   ALT 22  0 - 53 U/L   Comment: HEMOLYSIS AT THIS LEVEL MAY AFFECT RESULT   Alkaline Phosphatase 70  39 - 117 U/L   Total Bilirubin 0.8  0.3 - 1.2 mg/dL   GFR calc non Af Amer >90  >90 mL/min   GFR calc Af Amer >90  >90 mL/min   Comment: (NOTE)     The eGFR has been calculated using the CKD EPI equation.     This calculation has not been validated in all clinical situations.     eGFR's persistently <90 mL/min signify possible Chronic Kidney     Disease.  GLUCOSE, CAPILLARY     Status: Abnormal   Collection Time    06/08/13  7:11 AM      Result Value Range   Glucose-Capillary 128 (*) 70 - 99 mg/dL   Comment 1 Notify RN    GLUCOSE, CAPILLARY     Status: Abnormal   Collection Time    06/08/13 11:46 AM      Result Value Range   Glucose-Capillary 128 (*) 70 - 99 mg/dL   Comment 1 Notify RN    GLUCOSE, CAPILLARY     Status: Abnormal   Collection Time    06/08/13  4:36 PM      Result Value Range    Glucose-Capillary 63 (*) 70 - 99 mg/dL  GLUCOSE, CAPILLARY     Status: None   Collection Time    06/08/13  5:00 PM      Result Value Range   Glucose-Capillary 85  70 - 99 mg/dL  GLUCOSE, CAPILLARY     Status: None   Collection Time    06/08/13  9:02 PM      Result Value Range   Glucose-Capillary 94  70 - 99 mg/dL  GLUCOSE, CAPILLARY     Status: Abnormal   Collection Time    06/09/13  7:09 AM      Result Value Range   Glucose-Capillary 165 (*) 70 - 99 mg/dL   Comment 1 Notify RN       HEENT: normal Cardio: RRR and no murmur Resp: CTA B/L and unlabored GI: BS positive and non distended Extremity:  Pulses positive and No Edema Skin:   Intact except R med hand abrasion, no drainage, left elbow normal Neuro: Alert/Oriented, Cranial Nerve Abnormalities left central 7, Abnormal Sensory absent left hand and foot, Abnormal Motor trace Left shoulder add, Dysarthric and Other poor lip closure on Left Musc/Skel:  Other Left shoulder sublux Gen NAD   Assessment/Plan: 1. Functional deficits secondary to R MCA embolic infarct  which require 3+ hours per day of interdisciplinary therapy in a comprehensive inpatient rehab setting. Physiatrist is providing close team supervision and 24 hour management of active medical problems listed below. Physiatrist and rehab team continue to assess barriers to discharge/monitor patient progress toward functional and medical goals. FIM: FIM - Bathing Bathing Steps Patient Completed: Chest;Left Arm;Abdomen Bathing: 2: Max-Patient completes 3-4 [id number] parts or 25-49%  FIM - Upper Body Dressing/Undressing Upper body dressing/undressing steps patient completed: Thread/unthread right sleeve of pullover shirt/dresss Upper body dressing/undressing: 2: Max-Patient completed 25-49% of tasks FIM - Lower Body Dressing/Undressing Lower body dressing/undressing steps patient completed: Thread/unthread right underwear leg;Thread/unthread right pants leg Lower body  dressing/undressing: 1: Two helpers  FIM - Toileting Toileting steps  completed by patient: Performs perineal hygiene Toileting: 1: Two helpers  FIM - Radio producer Devices: Recruitment consultant Transfers: 2-To toilet/BSC: Max A (lift and lower assist);2-From toilet/BSC: Max A (lift and lower assist);1-Two helpers  FIM - Control and instrumentation engineer Devices: [employer] elevated Bed/Chair Transfer: 1: Two helpers;2: Chair or W/C > Bed: Max A (lift and lower assist);2: Bed > Chair or W/C: Max A (lift and lower assist);2: Sit > Supine: Max A (lifting assist/Pt. 25-49%);2: Supine > Sit: Max A (lifting assist/Pt. 25-49%)  FIM - Locomotion: Wheelchair Locomotion: Wheelchair: 1: Total Assistance/staff pushes wheelchair (Pt<25%) FIM - Locomotion: Ambulation Ambulation/Gait Assistance: Not tested (comment) Locomotion: Ambulation: 0: Activity did not occur  Comprehension Comprehension Mode: Auditory Comprehension: 5-Understands complex 90% of the time/Cues < 10% of the time  Expression Expression Mode: Verbal Expression: 5-Expresses basic needs/ideas: With extra time/assistive device  Social Interaction Social Interaction: 7-Interacts appropriately with others - No medications needed.  Problem Solving Problem Solving: 5-Solves complex 90% of the time/cues < 10% of the time  Memory Memory: 7-Complete Independence: No helper  Medical Problem List and Plan:  1. 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 Loop recorder implanted 06/07/13.  2. DVT Prophylaxis/Anticoagulation: SCDs. Monitor for any signs of DVT  3. Pain Management: Tylenol as needed for  4. Neuropsych: This patient is capable of making decisions on his own behalf.  5. Hypertension. Norvasc 10 mg daily, Coreg 12.5 mg twice a day, Catapres 0.3 mg twice a day, lisinopril 40 mg daily. Monitor with increased mobility  6. Diabetes mellitus with peripheral neuropathy. Hemoglobin A1c 6.2. DiaBeta  5 mg daily, Glucophage 1500 mg daily. Check blood sugars a.c. and at bedtime. Education by team  7. Sinusitis identified on MRI. Amoxicillin 06/03/2013 x5 days, monitor off abx  LOS (Days) 2 A FACE TO FACE EVALUATION WAS PERFORMED  KIRSTEINS,ANDREW E 06/09/2013, 9:14 AM

## 2013-06-09 NOTE — IPOC Note (Addendum)
Overall Plan of Care [facility]) Patient Details Name: Donald [name] MRN: [id number] DOB: 1946-01-20  Admitting Diagnosis: CVA  Hospital Problems: Principal Problem:   CVA (cerebral infarction)     Functional Problem List: Nursing Edema;Endurance;Medication Management;Motor;Nutrition;Pain;Perception;Safety;Sensory;Skin Integrity  PT Balance;Edema;Motor;Perception;Sensory  OT Balance;Motor;Perception;Sensory  SLP    TR         Basic ADL's: OT Grooming;Bathing;Dressing;Toileting     Advanced  ADL's: OT Simple Meal Preparation     Transfers: PT Bed Mobility;Bed to Chair;Car;Furniture;Floor  OT Toilet;Tub/Shower     Locomotion: PT Ambulation;Wheelchair Mobility;Stairs     Additional Impairments: OT Fuctional Use of Upper Extremity  SLP Communication;Swallowing expression    TR      Anticipated Outcomes Item Anticipated Outcome  Self Feeding mod I  Swallowing  Mod I with least restrictive PO   Basic self-care  min assist  Toileting  min assist   Bathroom Transfers min assist  Bowel/Bladder  Continent of bowel/bladder  Transfers  Min A  Locomotion  Min A ambulation, mod I wheelchair  Communication  Mod I with speech intelligibility strategies  Cognition     Pain  <3 on 0-10 scale  Safety/Judgment  mod I   Therapy Plan: PT Intensity: Minimum of 1-2 x/day ,45 to 90 minutes PT Frequency: 5 out of 7 days PT Duration Estimated Length of Stay: 25 days OT Intensity: Minimum of 1-2 x/day, 45 to 90 minutes OT Frequency: 5 out of 7 days OT Duration/Estimated Length of Stay: 24-28 days SLP Intensity: Minumum of 1-2 x/day, 30 to 90 minutes SLP Frequency: 5 out of 7 days SLP Duration/Estimated Length of Stay: 24-28 days       Team Interventions: Nursing Interventions Patient/Family Education;Disease Management/Prevention;Pain Management;Medication Management;Skin Care/Wound Management;Cognitive Remediation/Compensation;Discharge Planning;Psychosocial Support   PT interventions Ambulation/gait training;Balance/vestibular training;Community reintegration;Discharge planning;DME/adaptive equipment instruction;Functional electrical stimulation;Functional mobility training;Neuromuscular re-education;Pain management;Patient/family education;Splinting/orthotics;Stair training;Therapeutic Activities;Therapeutic Exercise;UE/LE Strength taining/ROM;UE/LE Coordination activities;Wheelchair propulsion/positioning  OT Interventions Publishing copy;Functional mobility training;Neuromuscular re-education;Pain management;Patient/family education;Psychosocial support;Self Care/advanced ADL retraining;Therapeutic Activities;Therapeutic Exercise;UE/LE Strength taining/ROM;UE/LE Coordination activities  SLP Interventions Cueing hierarchy;Dysphagia/aspiration precaution training;Oral motor exercises;Patient/family education;Therapeutic Activities;Therapeutic Exercise  TR Interventions    SW/CM Interventions Discharge Planning;Psychosocial Support;Patient/Family Education    Team Discharge Planning: Destination: PT-Home ,OT- Home , SLP-Home Projected Follow-up: PT-Home health PT, OT-  Home health OT;Outpatient OT;24 hour supervision/assistance, SLP-None Projected Equipment Needs: PT-Wheelchair (measurements);Wheelchair cushion (measurements) (Assistive device TBD), OT- 3 in 1 bedside comode;To be determined, SLP-None recommended by SLP Equipment Details: PT- , OT-  Patient/family involved in discharge planning: PT- Patient,  OT-Patient, SLP-Patient;Family member/caregiver  MD ELOS: 20-25 days Medical Rehab Prognosis:  Good Assessment: 68 y.o. right-handed male with history of hypertension as well as diabetes mellitus. Patient is from [place] and was visiting family in [place]. Admitted 06/03/2013 of left-sided weakness and slurred speech. MRI of the brain showed acute large right middle cerebral artery  infarct as well as left occipital encephalomalacia suggests remote small left posterior cerebral artery territory infarct. Echocardiogram with ejection fraction 35% grade 1 diastolic dysfunction. CT angiogram head and neck with no proximal branch occlusion or stenosis. Patient did receive TPA. Neurology services consulted placed on Plavix for CVA prophylaxis. TEE showed no PFO or thrombus loop recorder just implanted to rule out arrhythmia as potential cause  Now requiring 24/7 Rehab RN,MD, as well as CIR level PT, OT and SLP.  Treatment team will focus on ADLs and mobility with goals set at min A    See Team Conference Notes for weekly updates to  the plan of care

## 2013-06-10 ENCOUNTER — Inpatient Hospital Stay (HOSPITAL_COMMUNITY): Payer: Medicare Other | Admitting: Occupational Therapy

## 2013-06-10 ENCOUNTER — Inpatient Hospital Stay (HOSPITAL_COMMUNITY): Payer: Medicare Other | Admitting: Speech Pathology

## 2013-06-10 ENCOUNTER — Inpatient Hospital Stay (HOSPITAL_COMMUNITY): Payer: Medicare Other | Admitting: Physical Therapy

## 2013-06-10 LAB — GLUCOSE, CAPILLARY
Glucose-Capillary: 219 mg/dL — ABNORMAL HIGH (ref 70–99)
Glucose-Capillary: 169 mg/dL — ABNORMAL HIGH (ref 70–99)
Glucose-Capillary: 111 mg/dL — ABNORMAL HIGH (ref 70–99)

## 2013-06-10 MED ORDER — CARVEDILOL 12.5 MG PO TABS
12.5000 mg | ORAL_TABLET | Freq: Once | ORAL | Status: AC
  Administered 2013-06-10: 12.5 mg via ORAL
  Filled 2013-06-10: qty 1

## 2013-06-10 MED ORDER — CARVEDILOL 25 MG PO TABS
25.0000 mg | ORAL_TABLET | Freq: Two times a day (BID) | ORAL | Status: DC
  Administered 2013-06-11 – 2013-07-01 (×41): 25 mg via ORAL
  Filled 2013-06-10 (×44): qty 1

## 2013-06-10 NOTE — Progress Notes (Signed)
Occupational Therapy Session Note  Patient Details  Name: Donald [name] MRN: [id number] Date of Birth: 02-04-1946  Today's Date: 06/10/2013 Time: 1005-1100 and 1305-1330  Time Calculation (min): 55 min and 25 min  Short Term Goals: Week 1:  OT Short Term Goal 1 (Week 1): Pt will maintain midline sitting balance at EOB for 2 mins with supevision while completing self-care task OT Short Term Goal 2 (Week 1): Pt will complete bathing with max assist of one caregiver OT Short Term Goal 3 (Week 1): Pt will complete UB dressing with mod assist OT Short Term Goal 4 (Week 1): Pt will complete LB dressing with max assist of one caregiver OT Short Term Goal 5 (Week 1): Pt will complete toilet transfer squat pivot with max assist of one caregiver  Skilled Therapeutic Interventions/Progress Updates:    1) Pt seen for ADL retraining with focus on trunk control in sitting and standing with self-care tasks of bathing and dressing.  Pt received seated in recliner.  Engaged in bathing at sink with focus on upright sitting balance as pt tending to drift to Rt today in static sitting.  Pt able to doff socks and wash feet this session with intermittent tactile cues at trunk for stability.  Engaged in standing x3 with +2 assist with focus on weight shifting to promote midline standing with use of mirror for visual feedback.  2nd person providing manual facilitation and blocking at Lt knee to promote appropriate weight bearing and positioning in standing while this therapist provided tactile cues at trunk and verbal cues to promote weight shifting.  Pt required assist to complete LB dressing.  Pt with ability to lift LLE in sitting this session, however unable to coordinate LLE movement with threading pant leg.  2) Pt seen for 1:1 OT with focus on NM re-ed of trunk and LUE in unsupported sitting.  Pt performed squat pivot transfer to Lt with max assist to therapy mat.  Focused on trunk and head control with lateral  weight shifts and weight bearing through LUE first on elbow and progressing to through hand.  Educated on relaxing head to allow for weight shift and increased trunk and LUE activation to provide movement instead of relying on head control.  Post weight bearing and relaxing of head, pt with improved bicep and tricep activation.  Pt returned to recliner to Rt with min assist, noted impulsivity with movement with mod cues for control and safety. Pt passed off to SLP.  Therapy Documentation Precautions:  Precautions Precautions: Fall Restrictions Weight Bearing Restrictions: No General:   Vital Signs: Therapy Vitals Pulse Rate: 64 BP: 156/76 mmHg Patient Position, if appropriate: Lying Pain: Pain Assessment Pain Assessment: 0-10 Pain Score: 6  Pain Type: Acute pain Pain Location: Head Pain Orientation: Anterior Pain Descriptors / Indicators: Headache Pain Frequency: Occasional Pain Onset: Gradual Patients Stated Pain Goal: 2 Pain Intervention(s): Medication (See eMAR) (tylenol 650 mgpo)  See FIM for current functional status  Therapy/Group: Individual Therapy  Simonne Come 06/10/2013, 12:05 PM

## 2013-06-10 NOTE — Progress Notes (Signed)
Social Work [name], LCSW Social Worker Signed  Patient Care Conference Service date: 06/08/2013 1:42 PM  Inpatient RehabilitationTeam Conference and Plan of Care Update Date: 06/08/2013   Time: 11;15 am     Patient Name: Donald [name]       Medical Record Number: [id number]   Date of Birth: 06-21-1945 Sex: Male         Room/Bed: 7P71G/6Y69S-85 Payor Info: Payor: MEDICARE / Plan: MEDICARE PART A AND B / Product Type: *No Product type* /   Admitting Diagnosis: CVA   Admit Date/Time:  06/07/2013  6:02 PM Admission Comments: No comment available   Primary Diagnosis:  CVA (cerebral infarction) Principal Problem: CVA (cerebral infarction)    Patient Active Problem List     Diagnosis  Date Noted   .  Small vessel disease  06/07/2013   .  Obesity, unspecified  06/07/2013   .  CVA (cerebral infarction)  06/07/2013   .  [name] right middle cerebral artery infarct, embolic  46/27/0350   .  Hypertension  06/03/2013   .  Diabetes  06/03/2013     Expected Discharge Date: Expected Discharge Date: 06/30/13  Team Members Present: Physician leading conference: Dr. Alysia Penna Social Worker Present: Ovidio Kin, LCSW Nurse Present: Heather Roberts, RN PT Present: Raylene Everts, PT;Emily Parcell, PT OT Present: Antony Salmon, Jules Schick, OT PPS Coordinator present : Daiva Nakayama, RN, CRRN        Current Status/Progress  Goal  Weekly Team Focus   Medical     Constipation, severe weakness in RUE adn RLE as wellas Poor sensation  upgrae strength, regulate bowels  initiate therapy program, adjust meds   Bowel/Bladder     continent bowel and bladder loose bm this am LBM 1-07  mod I  monitor    Swallow/Nutrition/ Hydration     Dys 3 textures and thin liquids, intermittent supervision  Mod I  trials of regular textures, increase utilization of swallowing strategies   ADL's     max assist bathing and UB dressing, +2 LB dressing and squat pivot transfer to toilet, max assist sit <>  stand  min assist overall  sitting and standing balance, transfers, self-care retraining   Mobility     Min-mod A sitting balance, max A bed mobility, +2 transfers  Min A overall  Sitting balance, transfers, standing, LE NMR    Communication     mildly dysarthric speech  Mod I  utilization of speech intelligibility strategies   Safety/Cognition/ Behavioral Observations    No unsafe behaviors       Pain     tylenol 650 mg po prn every 4 hous   less than 3  monitor effectiveness of pain meds    Skin     dressing to loop recorder   now new breakdown   monitor skin and educate family on skin care      *See Care Plan and progress notes for long and short-term goals.    Barriers to Discharge:  heavy assist level, [name] pt      Possible Resolutions to Barriers:    cont rehab to upgrade to min A      Discharge Planning/Teaching Needs:    Home with wife providing the care-re-locating here and are making arrangements of where and the plan at discharge.    Team Discussion:    Goals-min level-poor sensation, r-gaze pref.  Loop recorder placed yesterday.  Evaluating in all areas   Revisions to Treatment Plan:  New eval    Continued Need for Acute Rehabilitation Level of Care: The patient requires daily medical management by a physician with specialized training in physical medicine and rehabilitation for the following conditions: Daily direction of a multidisciplinary physical rehabilitation program to ensure safe treatment while eliciting the highest outcome that is of practical value to the patient.: Yes Daily medical management of patient stability for increased activity during participation in an intensive rehabilitation regime.: Yes Daily analysis of laboratory values and/or radiology reports with any subsequent need for medication adjustment of medical intervention for : Neurological problems;Other  [name] 06/10/2013, 8:38 AM          Patient ID: Donald [name],  male   DOB: [birth year], 68 y.o.   MRN: [id number]

## 2013-06-10 NOTE — Progress Notes (Signed)
Speech Language Pathology Daily Session Note  Patient Details  Name: Donald [name] MRN: [id number] Date of Birth: [birth year]  Today's Date: 06/10/2013 Time: 1330-1415 Time Calculation (min): 45 min  Short Term Goals: Week 1: SLP Short Term Goal 1 (Week 1): Pt will consume Dys 3 textures and thin liquids with supervision level cues for utilization of safe swallowing strategies SLP Short Term Goal 2 (Week 1): Pt will demonstrate adequate masticaiton and oral clearance with regular textures with supervision to assess readiness for diet advancement SLP Short Term Goal 3 (Week 1): Pt will adequately perform oral motor exercises with supervision level cues SLP Short Term Goal 4 (Week 1): Pt will utilize speech intelligibility strategies at the sentence level with supervision level cues   Skilled Therapeutic Interventions: Skilled treatment session focused on addressing dysphagia and dysarthria goals. SLP facilitated session with request for patient to recall and return demonstration of previously taught oral motor exercises; patient was Mod I and fully intelligible throughout session.  SLP also facilitated session with set-up and Supervision level verbal cues to recall and utilize safe swallow strategies while consuming trials of regular textures and thin liquids.  Patient demonstrated no overt s/s of aspiration even with consuming large bites; however, he did report lingual fatigue during session.  Continue with current plan of care.    FIM:  Comprehension Comprehension Mode: Auditory Comprehension: 5-Understands complex 90% of the time/Cues < 10% of the time Expression Expression Mode: Verbal Expression: 6-Expresses complex ideas: With extra time/assistive device Social Interaction Social Interaction: 7-Interacts appropriately with others - No medications needed. Problem Solving Problem Solving: 5-Solves complex 90% of the time/cues < 10% of the time Memory Memory: 7-Complete  Independence: No helper FIM - Eating Eating Activity: 5: Set-up assist for open containers;5: Supervision/cues  Pain Pain Assessment Pain Assessment: No/denies pain  Therapy/Group: Individual Therapy  Carmelia Roller., [place] [phone]  [place] 06/10/2013, 4:24 PM

## 2013-06-10 NOTE — Progress Notes (Signed)
68 y.o. right-handed male with history of hypertension as well as diabetes mellitus. Patient is from [place] and was visiting family in [place]. Admitted 06/03/2013 of left-sided weakness and slurred speech. 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 Echocardiogram with ejection fraction 62% grade 1 diastolic dysfunction. CT angiogram head and neck with no proximal branch occlusion or stenosis. Patient did receive TPA. Neurology services consulted placed on Plavix for CVA prophylaxis. TEE showed no PFO or thrombus loop recorder just implanted to rule out arrhythmia as potential cause. Acute on chronic paranasal sinusitis identified on MRI of the brain placed on amoxicillin  Subjective/Complaints: Pt concerned about elevated BP, discussed that post CVA rapid correction of BP can cause increased ischemia No pain c/os Slept ok Review of Systems - Negative except weakness on Left and as above  Objective: Vital Signs: Blood pressure 156/76, pulse 64, temperature 98.3 F (36.8 C), temperature source Oral, resp. rate 18, height 6' 3"  (1.905 m), weight 121.564 kg (268 lb), SpO2 97.00%. No results found. Results for orders placed during the hospital encounter of 06/07/13 (from the past 72 hour(s))  GLUCOSE, CAPILLARY     Status: Abnormal   Collection Time    06/07/13  8:55 PM      Result Value Range   Glucose-Capillary 133 (*) 70 - 99 mg/dL  CBC WITH DIFFERENTIAL     Status: Abnormal   Collection Time    06/08/13  6:10 AM      Result Value Range   WBC 7.8  4.0 - 10.5 K/uL   Comment: WHITE COUNT CONFIRMED ON SMEAR   RBC 4.47  4.22 - 5.81 MIL/uL   Hemoglobin 14.9  13.0 - 17.0 g/dL   HCT 41.3  39.0 - 52.0 %   MCV 92.4  78.0 - 100.0 fL   MCH 33.3  26.0 - 34.0 pg   MCHC 36.1 (*) 30.0 - 36.0 g/dL   RDW 14.0  11.5 - 15.5 %   Platelets 191  150 - 400  K/uL   Comment: PLATELET COUNT CONFIRMED BY SMEAR   Neutrophils Relative % 59  43 - 77 %   Lymphocytes Relative 25  12 - 46 %   Monocytes Relative 12  3 - 12 %   Eosinophils Relative 3  0 - 5 %   Basophils Relative 1  0 - 1 %   Neutro Abs 4.6  1.7 - 7.7 K/uL   Lymphs Abs 2.0  0.7 - 4.0 K/uL   Monocytes Absolute 0.9  0.1 - 1.0 K/uL   Eosinophils Absolute 0.2  0.0 - 0.7 K/uL   Basophils Absolute 0.1  0.0 - 0.1 K/uL   Smear Review MORPHOLOGY UNREMARKABLE    COMPREHENSIVE METABOLIC PANEL     Status: Abnormal   Collection Time    06/08/13  6:10 AM      Result Value Range   Sodium 137  137 - 147 mEq/L   Potassium 4.0  3.7 - 5.3 mEq/L   Comment: HEMOLYSIS AT THIS LEVEL MAY AFFECT RESULT   Chloride 101  96 - 112 mEq/L   CO2 18 (*) 19 - 32 mEq/L   Glucose, Bld 131 (*) 70 - 99 mg/dL   BUN 13  6 - 23 mg/dL   Creatinine, Ser 0.74  0.50 - 1.35 mg/dL   Calcium 8.7  8.4 - 10.5 mg/dL   Total Protein 6.9  6.0 - 8.3 g/dL   Albumin 3.4 (*) 3.5 - 5.2 g/dL   AST 35  0 - 37 U/L   Comment: HEMOLYSIS AT THIS LEVEL MAY AFFECT RESULT   ALT 22  0 - 53 U/L   Comment: HEMOLYSIS AT THIS LEVEL MAY AFFECT RESULT   Alkaline Phosphatase 70  39 - 117 U/L   Total Bilirubin 0.8  0.3 - 1.2 mg/dL   GFR calc non Af Amer >90  >90 mL/min   GFR calc Af Amer >90  >90 mL/min   Comment: (NOTE)     The eGFR has been calculated using the CKD EPI equation.     This calculation has not been validated in all clinical situations.     eGFR's persistently <90 mL/min signify possible Chronic Kidney     Disease.  GLUCOSE, CAPILLARY     Status: Abnormal   Collection Time    06/08/13  7:11 AM      Result Value Range   Glucose-Capillary 128 (*) 70 - 99 mg/dL   Comment 1 Notify RN    GLUCOSE, CAPILLARY     Status: Abnormal   Collection Time    06/08/13 11:46 AM      Result Value Range   Glucose-Capillary 128 (*) 70 - 99 mg/dL   Comment 1 Notify RN    GLUCOSE, CAPILLARY     Status: Abnormal   Collection Time    06/08/13   4:36 PM      Result Value Range   Glucose-Capillary 63 (*) 70 - 99 mg/dL  GLUCOSE, CAPILLARY     Status: None   Collection Time    06/08/13  5:00 PM      Result Value Range   Glucose-Capillary 85  70 - 99 mg/dL  GLUCOSE, CAPILLARY     Status: None   Collection Time    06/08/13  9:02 PM      Result Value Range   Glucose-Capillary 94  70 - 99 mg/dL  GLUCOSE, CAPILLARY     Status: Abnormal   Collection Time    06/09/13  7:09 AM      Result Value Range   Glucose-Capillary 165 (*) 70 - 99 mg/dL   Comment 1 Notify RN    GLUCOSE, CAPILLARY     Status: Abnormal   Collection Time    06/09/13 11:34 AM      Result Value Range   Glucose-Capillary 145 (*) 70 - 99 mg/dL   Comment 1 Notify RN    GLUCOSE, CAPILLARY     Status: None   Collection Time    06/09/13  5:02 PM      Result Value Range   Glucose-Capillary 77  70 - 99 mg/dL  GLUCOSE, CAPILLARY     Status: Abnormal   Collection Time    06/09/13  8:39 PM      Result Value Range   Glucose-Capillary 105 (*) 70 - 99 mg/dL  GLUCOSE, CAPILLARY     Status: Abnormal   Collection Time    06/10/13  8:37 AM      Result Value Range   Glucose-Capillary 219 (*) 70 - 99 mg/dL   Comment 1 Notify RN       HEENT: normal Cardio: RRR and no murmur Resp: CTA B/L and unlabored GI: BS positive and non distended Extremity:  Pulses positive and No Edema Skin:   Intact except R med hand abrasion, no drainage, left elbow normal Neuro: Alert/Oriented, Cranial Nerve Abnormalities left central  7, Abnormal Sensory absent left hand and foot, Abnormal Motor 2- Left shoulder add, Bi, Tri and Thumb ext, 3- L Hip Add, 3-/5 hip flexion, 2-/5 KE, trace toe flexion, Dysarthric and Other poor lip closure on Left Musc/Skel:  Other Left shoulder sublux Gen NAD   Assessment/Plan: 1. Functional deficits secondary to R MCA embolic infarct  which require 3+ hours per day of interdisciplinary therapy in a comprehensive inpatient rehab setting. Physiatrist is  providing close team supervision and 24 hour management of active medical problems listed below. Physiatrist and rehab team continue to assess barriers to discharge/monitor patient progress toward functional and medical goals. FIM: FIM - Bathing Bathing Steps Patient Completed: Chest;Left Arm;Abdomen;Right upper leg;Left upper leg Bathing: 3: Mod-Patient completes 5-7 36f10 parts or 50-74%  FIM - Upper Body Dressing/Undressing Upper body dressing/undressing steps patient completed: Thread/unthread right sleeve of pullover shirt/dresss;Put head through opening of pull over shirt/dress Upper body dressing/undressing: 3: Mod-Patient completed 50-74% of tasks FIM - Lower Body Dressing/Undressing Lower body dressing/undressing steps patient completed: Thread/unthread right pants leg Lower body dressing/undressing: 1: Two helpers  FIM - Toileting Toileting steps completed by patient: Performs perineal hygiene Toileting: 0: Activity did not occur  FIM - TRadio producerDevices: BRecruitment consultantTransfers: 0-Activity did not occur  FIM - BControl and instrumentation engineerDevices: Bed rails Bed/Chair Transfer: 2: Supine > Sit: Max A (lifting assist/Pt. 25-49%);3: Sit > Supine: Mod A (lifting assist/Pt. 50-74%/lift 2 legs);4: Bed > Chair or W/C: Min A (steadying Pt. > 75%);4: Chair or W/C > Bed: Min A (steadying Pt. > 75%)  FIM - Locomotion: Wheelchair Distance: 150 Locomotion: Wheelchair: 2: Travels 50 - 149 ft with minimal assistance (Pt.>75%) FIM - Locomotion: Ambulation Ambulation/Gait Assistance: Not tested (comment) Locomotion: Ambulation: 0: Activity did not occur  Comprehension Comprehension Mode: Auditory Comprehension: 5-Understands complex 90% of the time/Cues < 10% of the time  Expression Expression Mode: Verbal Expression: 6-Expresses complex ideas: With extra time/assistive device  Social Interaction Social Interaction:  7-Interacts appropriately with others - No medications needed.  Problem Solving Problem Solving: 5-Solves complex 90% of the time/cues < 10% of the time  Memory Memory: 7-Complete Independence: No helper  Medical Problem List and Plan:  1. 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 Loop recorder implanted 06/07/13.  2. DVT Prophylaxis/Anticoagulation: SCDs. Monitor for any signs of DVT  3. Pain Management: Tylenol as needed for  4. Neuropsych: This patient is capable of making decisions on his own behalf.  5. Hypertension. Norvasc 10 mg daily, Coreg 12.5 mg twice a day, Catapres 0.3 mg twice a day, lisinopril 40 mg daily. Monitor with increased mobility  6. Diabetes mellitus with peripheral neuropathy.CBGs generally good but elevation this am Hemoglobin A1c 6.2. DiaBeta 5 mg daily, Glucophage 1500 mg daily. Check blood sugars a.c. and at bedtime. Education by team  7. Sinusitis identified on MRI. Amoxicillin 06/03/2013 x5 days, monitor off abx  LOS (Days) 3 A FACE TO FACE EVALUATION WAS PERFORMED  Finis Hendricksen E 06/10/2013, 9:27 AM

## 2013-06-10 NOTE — Progress Notes (Signed)
Physical Therapy Session Note  Patient Details  Name: Donald [name] MRN: [id number] Date of Birth: 19-May-1946  Today's Date: 06/10/2013 Time: 1100-1200 Time Calculation (min): 60 min  Short Term Goals: Week 1:  PT Short Term Goal 1 (Week 1): Pt will perform sit<>supine with mod A PT Short Term Goal 2 (Week 1): Pt will transfer bed<>chair max A +1 PT Short Term Goal 3 (Week 1): Pt will perform sit<>stand max A +1  Skilled Therapeutic Interventions/Progress Updates:    Pt received seated in recliner finishing session with OT. Pt transferred recliner>w/c to L +2 A. Pt propelled w/c 150 feet to therapy gym with hemi technique min A with max verbal cues for attention to L, technique to navigate obstacles on L, and to slow pace. Pt very impulsive and difficult to focus attention on problem solving/safety during mobility. Pt transferred w/c>mat to R min A. Performed L hip flexion, hip extension, knee flexion, knee extension x10 on powder board with manual facilitation for muscle activation. Increased muscle activation and control noted as activity progressed. Pt performed side lying>sit mod A. Pt performed lateral scoots to L with verbal and tactile cues for LLE positioning and forward weight shift. Pt performed sit<>stand +2A with mirror for visual feedback on weight shifting. Decreased L lean noted this session. Performed forward and retro stepping with RLE for weight shift and weight bearing through L. Limited posterior weight shift noted during retro step. Pt transferred mat>w/c to R mod A and w/c>recliner max A. Pt very impulsive during w/c>recliner transfer, attempting full stand/step vs lateral scoot transfers and requiring max verbal cues to utilizing scoot technique. Pt also requested to use the urinal stating he did not need assistance, but pt unable to hold urinal and lower pants independently. Pt left seated in recliner with needs in reach.    Therapy Documentation Precautions:   Precautions Precautions: Fall Restrictions Weight Bearing Restrictions: No Pain:  Pt denies pain     See FIM for current functional status  Therapy/Group: Individual Therapy  Pranathi Winfree, [place] 06/10/2013, 12:48 PM

## 2013-06-10 NOTE — Progress Notes (Signed)
I have reviewed and I agree with the following treatment note.  Mosetta Ferdinand Hall, PT, DPT  

## 2013-06-11 ENCOUNTER — Inpatient Hospital Stay (HOSPITAL_COMMUNITY): Payer: Medicare Other | Admitting: Physical Therapy

## 2013-06-11 ENCOUNTER — Inpatient Hospital Stay (HOSPITAL_COMMUNITY): Payer: Medicare Other | Admitting: *Deleted

## 2013-06-11 ENCOUNTER — Inpatient Hospital Stay (HOSPITAL_COMMUNITY): Payer: Medicare Other | Admitting: Speech Pathology

## 2013-06-11 DIAGNOSIS — E119 Type 2 diabetes mellitus without complications: Secondary | ICD-10-CM

## 2013-06-11 DIAGNOSIS — I10 Essential (primary) hypertension: Secondary | ICD-10-CM

## 2013-06-11 DIAGNOSIS — I6350 Cerebral infarction due to unspecified occlusion or stenosis of unspecified cerebral artery: Secondary | ICD-10-CM

## 2013-06-11 LAB — GLUCOSE, CAPILLARY
Glucose-Capillary: 83 mg/dL (ref 70–99)
Glucose-Capillary: 155 mg/dL — ABNORMAL HIGH (ref 70–99)
Glucose-Capillary: 124 mg/dL — ABNORMAL HIGH (ref 70–99)
Glucose-Capillary: 83 mg/dL (ref 70–99)
Glucose-Capillary: 92 mg/dL (ref 70–99)

## 2013-06-11 NOTE — Progress Notes (Signed)
Physical Therapy Session Note  Patient Details  Name: Donald [name] MRN: [id number] Date of Birth: 1945-06-28  Today's Date: 06/11/2013 Time: 6415-8309 Time Calculation (min): 42 min  Short Term Goals: Week 1:  PT Short Term Goal 1 (Week 1): Pt will perform sit<>supine with mod A PT Short Term Goal 2 (Week 1): Pt will transfer bed<>chair max A +1 PT Short Term Goal 3 (Week 1): Pt will perform sit<>stand max A +1  Skilled Therapeutic Interventions/Progress Updates:  Pt was seen bedside in the pm. Pt transferred w/c to commode utilizing grad bar, with + 2 max A. Pt transferred commode to w/c to R side with mod A. Pt propelled w/c about 150 feet with R UE and LE, requires S and verbal cues. In gym, treatment focused on transfers on and off mat to R side, pt able to perform transfers with mod A and verbal cues for safety and technique.   Therapy Documentation Precautions:  Precautions Precautions: Fall Restrictions Weight Bearing Restrictions: No General:   Pain: No c/o pain.   See FIM for current functional status  Therapy/Group: Individual Therapy  Dub Amis 06/11/2013, 3:44 PM

## 2013-06-11 NOTE — Progress Notes (Signed)
Patient ID: Donald [name], male   DOB: Oct 25, 1945, 68 y.o.   MRN: [id number]  06/11/13. 68 y.o. right-handed male with history of hypertension as well as diabetes mellitus. Patient is from [place] and was visiting family in [place]. Admitted 06/03/2013 of left-sided weakness and slurred speech. 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 Echocardiogram with ejection fraction 61% grade 1 diastolic dysfunction. CT angiogram head and neck with no proximal branch occlusion or stenosis. Patient did receive TPA. Neurology services consulted placed on Plavix for CVA prophylaxis. TEE showed no PFO or thrombus;  loop recorder just implanted to rule out arrhythmia as potential cause. Acute on chronic paranasal sinusitis identified on MRI of the brain placed on amoxicillin  Subjective/Complaints: Pt concerned about elevated BP, discussed that post CVA rapid correction of BP can cause increased ischemia No pain c/os Slept ok Review of Systems - Negative except weakness on Left and as above   Patient Vitals for the past 24 hrs:  BP Temp Temp src Pulse Resp SpO2  06/11/13 0627 133/79 mmHg - - 65 - -  06/11/13 0526 193/106 mmHg 98.6 F (37 C) - 67 20 99 %  06/10/13 2201 183/95 mmHg - - 69 20 96 %  06/10/13 2102 208/95 mmHg - - 73 20 96 %  06/10/13 1846 160/80 mmHg - - 62 20 95 %  06/10/13 1515 - 97.4 F (36.3 C) Oral 59 22 96 %   CBG (last 3)   Recent Labs  06/10/13 1624 06/10/13 2057 06/11/13 0722  GLUCAP 83 111* 155*     Intake/Output Summary (Last 24 hours) at 06/11/13 0902 Last data filed at 06/10/13 2305  Gross per 24 hour  Intake    720 ml  Output    750 ml  Net    -30 ml   Objective:  Gen- alert, no complaints    HEENT: normal Cardio: RRR and no murmur Resp: CTA B/L and unlabored GI: BS positive and non distended Extremity:   Pulses positive and No Edema Skin:   neg Neuro: Alert/Oriented, improving L HP Musc/Skel: neg; event monitor in place anterior chest wall    Assessment/Plan: 1. Functional deficits secondary to R MCA embolic infarct  which require 3+ hours per day of interdisciplinary therapy in a comprehensive inpatient rehab setting. 2.  HTN- remains labile; will continue to monitor 3. DM2- stable    LOS (Days) 4 A FACE TO FACE EVALUATION WAS PERFORMED  [name] 06/11/2013, 8:59 AM

## 2013-06-11 NOTE — Progress Notes (Signed)
Physical Therapy Session Note  Patient Details  Name: Donald [name] MRN: [id number] Date of Birth: Oct 21, 1945  Today's Date: 06/11/2013 Time: 1100-1200 Time Calculation (min): 60 min  Short Term Goals: Week 1:  PT Short Term Goal 1 (Week 1): Pt will perform sit<>supine with mod A PT Short Term Goal 2 (Week 1): Pt will transfer bed<>chair max A +1 PT Short Term Goal 3 (Week 1): Pt will perform sit<>stand max A +1  Skilled Therapeutic Interventions/Progress Updates:  Pt was seen bedside in the am. Pt propelled w/c 150 feet with R UE and LE with S and occasional verbal cues for safety and inattention. Pt performed multiple sit to stand transfers in parallel bars with min to mod A, while standing focusing and activation of L quad, weight shifting, single leg stance, and posture. Pt propelled w/c about 75 feet with R UE and LE, requiring S and verbal cues.    Therapy Documentation Precautions:  Precautions Precautions: Fall Restrictions Weight Bearing Restrictions: No General:   Pain: No c/o pain.   See FIM for current functional status  Therapy/Group: Individual Therapy  Dub Amis 06/11/2013, 12:50 PM

## 2013-06-11 NOTE — Progress Notes (Signed)
Speech Language Pathology Daily Session Note  Patient Details  Name: Donald [name] MRN: [id number] Date of Birth: 08/24/1945  Today's Date: 06/11/2013 Time: 4580-9983 Time Calculation (min): 30 min  Short Term Goals: Week 1: SLP Short Term Goal 1 (Week 1): Pt will consume Dys 3 textures and thin liquids with supervision level cues for utilization of safe swallowing strategies SLP Short Term Goal 2 (Week 1): Pt will demonstrate adequate masticaiton and oral clearance with regular textures with supervision to assess readiness for diet advancement SLP Short Term Goal 3 (Week 1): Pt will adequately perform oral motor exercises with supervision level cues SLP Short Term Goal 4 (Week 1): Pt will utilize speech intelligibility strategies at the sentence level with supervision level cues   Skilled Therapeutic Interventions: Treatment focus on speech goals. Upon arrival, pt's LUE was hanging beside his wheelchair and pt required supervision question cues to self-monitor and correct for proper positioning. Pt was Mod I for utilization of speech intelligibility at the conversation level but required supervision-Min verbal and question cues to decrease verbosity throughout the session. Pt also required supervision question cues for emergent awareness in regards to utilization of swallowing compensatory strategies and adequate performance of oral-motor exercises. Trials were not attempted due to verbosity and running out of time in the session. Continue plan of care.    FIM:  Comprehension Comprehension Mode: Auditory Comprehension: 5-Understands basic 90% of the time/requires cueing < 10% of the time Expression Expression Mode: Verbal Expression: 6-Expresses complex ideas: With extra time/assistive device Social Interaction Social Interaction: 6-Interacts appropriately with others with medication or extra time (anti-anxiety, antidepressant). Problem Solving Problem Solving: 5-Solves complex 90%  of the time/cues < 10% of the time Memory Memory: 6-More than reasonable amt of time  Pain Pain Assessment Pain Assessment: No/denies pain  Therapy/Group: Individual Therapy  Sandralee Tarkington, [place] 06/11/2013, 3:20 PM

## 2013-06-11 NOTE — Progress Notes (Signed)
Occupational Therapy Note  Patient Details  Name: Donald [name] MRN: [id number] Date of Birth: Mar 02, 1946 Today's Date: 06/11/2013  Time:0800-0900  (60 min) Pain: no pain; left arm and leg are sore from previous therapies Individual session   Short Term Goals:  Week 1: OT Short Term Goal 1 (Week 1): Pt will maintain midline sitting balance at EOB for 2 mins with supevision while completing self-care task  OT Short Term Goal 2 (Week 1): Pt will complete bathing with max assist of one caregiver  OT Short Term Goal 3 (Week 1): Pt will complete UB dressing with mod assist  OT Short Term Goal 4 (Week 1): Pt will complete LB dressing with max assist of one caregiver  OT Short Term Goal 5 (Week 1): Pt will complete toilet transfer squat pivot with max assist of one caregiver     Skilled Therapeutic Interventions/Progress Updates:  1)  BP supine=  166/82 .   Pt seen for ADL retraining with focus on trunk control in sitting and standing with self-care tasks of bathing and dressing. Pt received lying in bed upon arrival and eating breakfast.  Transferred from bed to wc with +2.    Engaged in bathing at sink with focus on upright sitting balance.  Provided cues for upright sitting and postural control.   Engaged in standing x3 with +2 assist with focus on weight shifting to promote midline standing .  Pt. Engaged in standing x3 for donning clothes.  Instructed pt to place LUE in good  positioning when sitting in wc.   Pt required max assist to complete LB dressing. Pt with ability to lift LLE in sitting this session, however unable to coordinate LLE movement with threading pant leg.   Left pt in wc with call bell in reach and ready for next therapy session.        Lisa Roca 06/11/2013, 8:02 AM

## 2013-06-11 NOTE — Progress Notes (Signed)
Patient B/P 183/95 att 22:01. Called Dr. Alain Marion (on call). and ordered  Coreg 12.5 mg 1X. and increased Coreg from 12.5 mg to 25 mg BID PO. Rechecked B/P 193/106. Called Dr. Alain Marion again and ordered  to give all scheduled B/P meds around 0526. Patient asymptomatic. Will continue to monitor.

## 2013-06-12 ENCOUNTER — Inpatient Hospital Stay (HOSPITAL_COMMUNITY): Payer: Medicare Other | Admitting: Physical Therapy

## 2013-06-12 LAB — GLUCOSE, CAPILLARY
Glucose-Capillary: 125 mg/dL — ABNORMAL HIGH (ref 70–99)
Glucose-Capillary: 154 mg/dL — ABNORMAL HIGH (ref 70–99)
Glucose-Capillary: 121 mg/dL — ABNORMAL HIGH (ref 70–99)
Glucose-Capillary: 130 mg/dL — ABNORMAL HIGH (ref 70–99)

## 2013-06-12 NOTE — Progress Notes (Signed)
Physical Therapy Session Note  Patient Details  Name: Donald [name] MRN: [id number] Date of Birth: 01-22-1946  Today's Date: 06/12/2013 Time: 0086-7619 Time Calculation (min): 42 min  Short Term Goals: Week 1:  PT Short Term Goal 1 (Week 1): Pt will perform sit<>supine with mod A PT Short Term Goal 2 (Week 1): Pt will transfer bed<>chair max A +1 PT Short Term Goal 3 (Week 1): Pt will perform sit<>stand max A +1  Skilled Therapeutic Interventions/Progress Updates:  Pt was seen bedside in the pm. Pt c/o fatigue secondary to multiple episodes of diarrhea last night. Pt willing to participate with therapy. Pt transferred supine to edge of bed with side rail and mod A. Pt transferred edge of bed to w/c to R side with mod A and verbal cues for technique. Pt performed multiple transfers to the R side in gym with mod A and verbal cues for technique.   Therapy Documentation Precautions:  Precautions Precautions: Fall Restrictions Weight Bearing Restrictions: No General:   Pain: No c/o pain.   See FIM for current functional status  Therapy/Group: Individual Therapy  Dub Amis 06/12/2013, 3:53 PM

## 2013-06-12 NOTE — Progress Notes (Signed)
Patient ID: Donald [name], male   DOB: July 06, 1945, 68 y.o.   MRN: [id number]  Patient ID: Donald [name], male   DOB: 1946-03-17, 68 y.o.   MRN: [id number]  06/12/13. 68 y.o. right-handed male with history of hypertension as well as diabetes mellitus. Patient is from [place] and was visiting family in [place]. Admitted 06/03/2013 of left-sided weakness and slurred speech. 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 Echocardiogram with ejection fraction 70% grade 1 diastolic dysfunction. CT angiogram head and neck with no proximal branch occlusion or stenosis. Patient did receive TPA. Neurology services consulted placed on Plavix for CVA prophylaxis. TEE showed no PFO or thrombus;  loop recorder just implanted to rule out arrhythmia as potential cause. Acute on chronic paranasal sinusitis identified on MRI of the brain placed on amoxicillin  Subjective/Complaints: Pt concerned about elevated BP, discussed that post CVA rapid correction of BP can cause increased ischemia.  Patient has developed some diarrhea-remains on amoxicillin No pain c/os Slept ok Review of Systems - Negative except weakness on Left and as above   Patient Vitals for the past 24 hrs:  BP Temp Temp src Pulse Resp SpO2  06/12/13 0630 202/94 mmHg 97.6 F (36.4 C) Oral 63 20 98 %  06/11/13 2017 185/81 mmHg - - - - -  06/11/13 1838 185/109 mmHg 97.9 F (36.6 C) - 63 18 96 %   CBG (last 3)   Recent Labs  06/11/13 1703 06/11/13 2121 06/12/13 0731  GLUCAP 83 92 125*     Intake/Output Summary (Last 24 hours) at 06/12/13 0847 Last data filed at 06/12/13 3500  Gross per 24 hour  Intake      0 ml  Output   1050 ml  Net  -1050 ml   Objective:  Gen- alert, no complaints    HEENT: normal Cardio: RRR and no murmur Resp: CTA B/L and unlabored GI: BS positive and non  distended Extremity:  Pulses positive and  trace Edema GU- condom catheter in place Skin:   neg Neuro: Alert/Oriented, improving L HP Musc/Skel: neg; event monitor in place anterior chest wall    Assessment/Plan: 1. Functional deficits secondary to R MCA embolic infarct  which require 3+ hours per day of interdisciplinary therapy in a comprehensive inpatient rehab setting. 2.  HTN- remains labile; will continue to monitor 3. DM2- stable 4.  diarrhea. We'll discontinue amoxicillin    LOS (Days) 5 A FACE TO FACE EVALUATION WAS PERFORMED  Donald [name] 06/12/2013, 8:47 AM

## 2013-06-13 ENCOUNTER — Inpatient Hospital Stay (HOSPITAL_COMMUNITY): Payer: Medicare Other | Admitting: Physical Therapy

## 2013-06-13 ENCOUNTER — Inpatient Hospital Stay (HOSPITAL_COMMUNITY): Payer: Medicare Other

## 2013-06-13 ENCOUNTER — Inpatient Hospital Stay (HOSPITAL_COMMUNITY): Payer: Medicare Other | Admitting: Speech Pathology

## 2013-06-13 LAB — GLUCOSE, CAPILLARY
Glucose-Capillary: 118 mg/dL — ABNORMAL HIGH (ref 70–99)
Glucose-Capillary: 140 mg/dL — ABNORMAL HIGH (ref 70–99)
Glucose-Capillary: 103 mg/dL — ABNORMAL HIGH (ref 70–99)
Glucose-Capillary: 157 mg/dL — ABNORMAL HIGH (ref 70–99)

## 2013-06-13 MED ORDER — CLONIDINE HCL 0.3 MG PO TABS
0.3000 mg | ORAL_TABLET | Freq: Three times a day (TID) | ORAL | Status: DC
  Administered 2013-06-13 – 2013-07-01 (×53): 0.3 mg via ORAL
  Filled 2013-06-13 (×57): qty 1

## 2013-06-13 NOTE — Progress Notes (Signed)
Physical Therapy Session Note  Patient Details  Name: Donald [name] MRN: [id number] Date of Birth: 1945-08-22  Today's Date: 06/13/2013 Time: 1100-1155 Time Calculation (min): 55 min  Skilled Therapeutic Interventions/Progress Updates:    Pt received seated in w/c. Pt propelled w/c >200 ft with hemi technique supervision-min A. Pt continues to demonstrate L inattention during w/c mobility requiring max verbal cues for attention to objects on L and for problem solving to navigate safely around objects. Pt transferred mat<>w/c x2 with supervision transferring to R and min A transferring to L. Performed sit>stand +2 with Harmon Pier walker. Verbal cues and manual facilitation for hip extension, L knee extension, and upright trunk posture. Pt stepped forward and back with BLE x1 to facilitate weight shift and weight bearing. Transitioned to sit<>stand at // bar (+2A) secondary to difficulty providing adequate guarding/facilitation in [place] walker. Pt performed lateral weight shift to R and L with some increase in weight bearing and quad extension on L noted as pt continued to stand. Returned pt to room, pt left seated in w/c with needs in reach.   Therapy Documentation Precautions:  Precautions Precautions: Fall Restrictions Weight Bearing Restrictions: No Pain: Pain Assessment Pain Assessment: No/denies pain Locomotion : Wheelchair Mobility Distance: 200   See FIM for current functional status  Therapy/Group: Individual Therapy  [name] 06/13/2013, 12:17 PM

## 2013-06-13 NOTE — Progress Notes (Signed)
Physical Therapy Session Note  Patient Details  Name: Donald [name] MRN: [id number] Date of Birth: [birth year]  Today's Date: 06/13/2013 Time: 1430 (full time 1430-1515, cotreat with CC)-1500 Time Calculation (min): 30 min  Short Term Goals: Week 1:  PT Short Term Goal 1 (Week 1): Pt will perform sit<>supine with mod A PT Short Term Goal 2 (Week 1): Pt will transfer bed<>chair max A +1 PT Short Term Goal 3 (Week 1): Pt will perform sit<>stand max A +1  Skilled Therapeutic Interventions/Progress Updates:   Co-treat with another PT with focus on NMR during standing and gait; see below for details.  Pt transported to/from the gym in w/c total A.  Transferred w/c > mat to L with min A to maintain LLE position on floor.    Therapy Documentation Precautions:  Precautions Precautions: Fall Restrictions Weight Bearing Restrictions: No Vital Signs: Therapy Vitals Pulse Rate: 64 BP: 137/78 mmHg Pain: Pain Assessment Pain Assessment: No/denies pain Pain Score: 2  Pain Type: Acute pain Pain Location: Back Pain Orientation: Lower Pain Descriptors / Indicators: Aching Pain Frequency: Occasional Pain Onset: Gradual Patients Stated Pain Goal: 2 Pain Intervention(s): Medication (See eMAR) (tylenol 650 mgpo) Other Treatments: Treatments Neuromuscular Facilitation: Left;Lower Extremity;Activity to increase coordination;Activity to increase motor control;Activity to increase timing and sequencing;Activity to increase sustained activation;Activity to increase lateral weight shifting;Activity to increase grading;Activity to increase anterior-posterior weight shifting;Right during gait in [place] with Harmon Pier walker x 25' x 1 rep with +2 A with one therapist assisting with navigation and management of EVA walker both therapists providing tactile, verbal and visual cues for trunk elongation and anterior pelvic rotation on L to initiate LLE hip extension in stance, verbal cues for full step length RLE  and cues for R lateral weight shifting during R stance to allow pt to advance LLE.  Pt able to perform full swing phase LLE but required assistance for safe placement of L foot.  Continued NMR/pre gait training in standing with Maxi Sky for body weight support and EVA for bilat UE support but in static standing with mirror as visual feedback for LLE position secondary to lack of proprioception and sensation.  Focus on L trunk elongation, initiation of anterior pelvic rotation, hip and knee extension and lateral weight shift while lifting R heel off floor for increased activation in LLE.  No active quad or glute activation noted in WB position but initiation of trunk elongation and pelvic rotation improved with repetition.    See FIM for current functional status  Therapy/Group: Co-Treatment  Raylene Everts [place] 06/13/2013, 3:34 PM

## 2013-06-13 NOTE — Plan of Care (Signed)
Problem: RH SKIN INTEGRITY Goal: RH STG SKIN FREE OF INFECTION/BREAKDOWN Skin free of infection and breakdown with mod assistance  Outcome: Not Progressing Rash noted to buttocks and spreading to posterior thighs . microguard powder applied Goal: RH STG MAINTAIN SKIN INTEGRITY WITH ASSISTANCE STG Maintain Skin Integrity With mod Assistance.  Outcome: Not Progressing Requires max assist

## 2013-06-13 NOTE — Progress Notes (Signed)
Physical Therapy Note  Patient Details  Name: Donald [name] MRN: [id number] Date of Birth: 04/21/1946 Today's Date: 06/13/2013 2683-4196 Co-treatment with PT for safe skilled transfers and use of Maxi Sky for neuromuscular re-education LLE and L trunk in supported standing with Harmon Pier walker.  +2 required due to pt's inability to activate L hip or knee extensors.  See note signed by Raylene Everts, PT for details.  Donald [name] 06/13/2013, 3:37 PM

## 2013-06-13 NOTE — Progress Notes (Signed)
68 y.o. right-handed male with history of hypertension as well as diabetes mellitus. Patient is from [place] and was visiting family in [place]. Admitted 06/03/2013 of left-sided weakness and slurred speech. 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 Echocardiogram with ejection fraction 82% grade 1 diastolic dysfunction. CT angiogram head and neck with no proximal branch occlusion or stenosis. Patient did receive TPA. Neurology services consulted placed on Plavix for CVA prophylaxis. TEE showed no PFO or thrombus loop recorder just implanted to rule out arrhythmia as potential cause. Acute on chronic paranasal sinusitis identified on MRI of the brain placed on amoxicillin  Subjective/Complaints: Moving Left side better diarrhea improving off Amox Review of Systems - Negative except weakness on Left and as above  Objective: Vital Signs: Blood pressure 189/90, pulse 61, temperature 97.7 F (36.5 C), temperature source Oral, resp. rate 18, height 6\' 3"  (1.905 m), weight 121.564 kg (268 lb), SpO2 96.00%. No results found. Results for orders placed during the hospital encounter of 06/07/13 (from the past 72 hour(s))  GLUCOSE, CAPILLARY     Status: Abnormal   Collection Time    06/10/13  8:37 AM      Result Value Range   Glucose-Capillary 219 (*) 70 - 99 mg/dL   Comment 1 Notify RN    GLUCOSE, CAPILLARY     Status: Abnormal   Collection Time    06/10/13 11:20 AM      Result Value Range   Glucose-Capillary 169 (*) 70 - 99 mg/dL   Comment 1 Notify RN    GLUCOSE, CAPILLARY     Status: None   Collection Time    06/10/13  4:24 PM      Result Value Range   Glucose-Capillary 83  70 - 99 mg/dL   Comment 1 Notify RN    GLUCOSE, CAPILLARY     Status: Abnormal   Collection Time    06/10/13  8:57 PM      Result Value Range   Glucose-Capillary 111 (*)  70 - 99 mg/dL   Comment 1 Notify RN    GLUCOSE, CAPILLARY     Status: Abnormal   Collection Time    06/11/13  7:22 AM      Result Value Range   Glucose-Capillary 155 (*) 70 - 99 mg/dL  GLUCOSE, CAPILLARY     Status: Abnormal   Collection Time    06/11/13 11:58 AM      Result Value Range   Glucose-Capillary 124 (*) 70 - 99 mg/dL  GLUCOSE, CAPILLARY     Status: None   Collection Time    06/11/13  5:03 PM      Result Value Range   Glucose-Capillary 83  70 - 99 mg/dL  GLUCOSE, CAPILLARY     Status: None   Collection Time    06/11/13  9:21 PM      Result Value Range   Glucose-Capillary 92  70 - 99 mg/dL   Comment 1 Notify RN    GLUCOSE, CAPILLARY     Status: Abnormal   Collection Time    06/12/13  7:31 AM      Result Value Range   Glucose-Capillary 125 (*) 70 - 99 mg/dL  GLUCOSE, CAPILLARY     Status: Abnormal   Collection Time    06/12/13 11:20 AM      Result Value Range   Glucose-Capillary 154 (*) 70 -  99 mg/dL  GLUCOSE, CAPILLARY     Status: Abnormal   Collection Time    06/12/13  4:43 PM      Result Value Range   Glucose-Capillary 121 (*) 70 - 99 mg/dL   Comment 1 Notify RN    GLUCOSE, CAPILLARY     Status: Abnormal   Collection Time    06/12/13  8:49 PM      Result Value Range   Glucose-Capillary 130 (*) 70 - 99 mg/dL  GLUCOSE, CAPILLARY     Status: Abnormal   Collection Time    06/13/13  7:18 AM      Result Value Range   Glucose-Capillary 118 (*) 70 - 99 mg/dL   Comment 1 Notify RN       HEENT: normal Cardio: RRR and no murmur Resp: CTA B/L and unlabored GI: BS positive and non distended Extremity:  Pulses positive and No Edema Skin:   Intact except R med hand abrasion, no drainage, left elbow normal Neuro: Alert/Oriented, Cranial Nerve Abnormalities left central 7, Abnormal Sensory absent left hand and foot, Abnormal Motor 3- Left shoulder abd, add, Bi, Tri and Thumb ext, 3- L Hip Add, 3-/5 hip flexion, 3-/5 KE, trace toe flexion, Dysarthric and Other poor  lip closure on Left Musc/Skel:  Other Left shoulder sublux Gen NAD   Assessment/Plan: 1. Functional deficits secondary to R MCA embolic infarct  which require 3+ hours per day of interdisciplinary therapy in a comprehensive inpatient rehab setting. Physiatrist is providing close team supervision and 24 hour management of active medical problems listed below. Physiatrist and rehab team continue to assess barriers to discharge/monitor patient progress toward functional and medical goals. FIM: FIM - Bathing Bathing Steps Patient Completed: Chest;Left Arm;Abdomen Bathing: 3: Mod-Patient completes 5-7 56f 10 parts or 50-74%  FIM - Upper Body Dressing/Undressing Upper body dressing/undressing steps patient completed: Thread/unthread right sleeve of pullover shirt/dresss;Put head through opening of pull over shirt/dress Upper body dressing/undressing: 3: Mod-Patient completed 50-74% of tasks FIM - Lower Body Dressing/Undressing Lower body dressing/undressing steps patient completed: Thread/unthread right pants leg Lower body dressing/undressing: 1: Two helpers  FIM - Toileting Toileting steps completed by patient: Performs perineal hygiene Toileting: 0: Activity did not occur  FIM - Radio producer Devices: Recruitment consultant Transfers: 0-Activity did not occur  FIM - Control and instrumentation engineer Devices: Arm rests;Bed rails Bed/Chair Transfer: 3: Supine > Sit: Mod A (lifting assist/Pt. 50-74%/lift 2 legs  FIM - Locomotion: Wheelchair Distance: 150 Locomotion: Wheelchair: 5: Travels 150 ft or more: maneuvers on rugs and over door sills with supervision, cueing or coaxing FIM - Locomotion: Ambulation Ambulation/Gait Assistance: Not tested (comment) Locomotion: Ambulation: 0: Activity did not occur  Comprehension Comprehension Mode: Auditory Comprehension: 5-Understands basic 90% of the time/requires cueing < 10% of the  time  Expression Expression Mode: Verbal Expression: 6-Expresses complex ideas: With extra time/assistive device  Social Interaction Social Interaction: 6-Interacts appropriately with others with medication or extra time (anti-anxiety, antidepressant).  Problem Solving Problem Solving: 5-Solves complex 90% of the time/cues < 10% of the time  Memory Memory: 6-More than reasonable amt of time  Medical Problem List and Plan:  1. 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 Loop recorder implanted 06/07/13.  2. DVT Prophylaxis/Anticoagulation: SCDs. Monitor for any signs of DVT  3. Pain Management: Tylenol as needed for  4. Neuropsych: This patient is capable of making decisions on his own behalf.  5. Hypertension. Norvasc 10 mg daily, Coreg  12.5 mg twice a day,increase Catapres 0.3 mg tid, lisinopril 40 mg daily. Monitor with increased mobility  6. Diabetes mellitus with peripheral neuropathy.CBGs generally good but elevation this am Hemoglobin A1c 6.2. DiaBeta 5 mg daily, Glucophage 1500 mg daily. Check blood sugars a.c. and at bedtime. Education by team  7. Sinusitis identified on MRI. , monitor off abx  LOS (Days) 6 A FACE TO FACE EVALUATION WAS PERFORMED  Donald [name] E 06/13/2013, 8:06 AM

## 2013-06-13 NOTE — Progress Notes (Signed)
I have reviewed and I agree with the following treatment note.  Audra Hall, PT, DPT  

## 2013-06-13 NOTE — Progress Notes (Signed)
Occupational Therapy Session Note  Patient Details  Name: Donald [name] MRN: [id number] Date of Birth: Aug 24, 1945  Today's Date: 06/13/2013 Time: 9924-2683 Time Calculation (min): 55 min  Short Term Goals: Week 1:  OT Short Term Goal 1 (Week 1): Pt will maintain midline sitting balance at EOB for 2 mins with supevision while completing self-care task OT Short Term Goal 2 (Week 1): Pt will complete bathing with max assist of one caregiver OT Short Term Goal 3 (Week 1): Pt will complete UB dressing with mod assist OT Short Term Goal 4 (Week 1): Pt will complete LB dressing with max assist of one caregiver OT Short Term Goal 5 (Week 1): Pt will complete toilet transfer squat pivot with max assist of one caregiver  Skilled Therapeutic Interventions/Progress Updates:    Pt seen for ADL retraining with focus on bed mobility, static sitting balance, sit <> stand, transfers, and functional use of LUE during self-care task of bathing and dressing.  Pt required min assist supine to sit with stabilization at trunk while pt focusing on advancing LLE to EOB.  Pt with static sitting balance supervision throughout UB bathing and dressing, however requires min-mod assist during dynamic sitting when washing BLE and attempting to thread shorts as pt would lose balance to Rt.  Required +2 assist for sit > stand and to maintain static standing with therapist blocking LLE while second person assisted with pulling pants over hips.  Educated pt on crossing BLE over opposite knee to complete bathing, threading shorts, and donning socks.  Pt required increased time with donning socks and cues due to decreased problem solving.  Squat pivot transfer to Lt with max assist this session.  Engaged in sit > stand x2 at mirror with support at [place] knee to prevent buckling, pt with increased quad activation able to straighten at knee.  Utilized Geologist, engineering for [employer] and 2nd person to promote weight shifting to Rt to promote  midline standing.  Therapy Documentation Precautions:  Precautions Precautions: Fall Restrictions Weight Bearing Restrictions: No General:   Vital Signs: Therapy Vitals BP: 174/94 mmHg Patient Position, if appropriate: Lying Pain: Pain Assessment Pain Assessment: No/denies pain  See FIM for current functional status  Therapy/Group: Individual Therapy  Simonne Come 06/13/2013, 12:23 PM

## 2013-06-13 NOTE — Progress Notes (Signed)
Speech Language Pathology Daily Session Note  Patient Details  Name: Donald [name] MRN: [id number] Date of Birth: 15-Aug-1945  Today's Date: 06/13/2013 Time: 0836-0900 Time Calculation (min): 24 min  Short Term Goals: Week 1: SLP Short Term Goal 1 (Week 1): Pt will consume Dys 3 textures and thin liquids with supervision level cues for utilization of safe swallowing strategies SLP Short Term Goal 2 (Week 1): Pt will demonstrate adequate masticaiton and oral clearance with regular textures with supervision to assess readiness for diet advancement SLP Short Term Goal 3 (Week 1): Pt will adequately perform oral motor exercises with supervision level cues SLP Short Term Goal 4 (Week 1): Pt will utilize speech intelligibility strategies at the sentence level with supervision level cues   Skilled Therapeutic Interventions: Skilled treatment session focused on addressing education regarding dysphagia and dysarthria goals.  Patient reported that he just completed breakfast and declined p.o. trials for the sake of blood sugar management.  Patient verbally reported performing oral motor exercises, he stated he had the ability to express himself easily with familiar and non-familiar listeners and that he has been focusing on lingual sweep to manage buccal pocketing over the weekend.  SLP educated on need for set-up assist with upgrade to regular textures due to food no longer coming cut up and patient verbalized understanding of information; as a result recommend diet upgrade to regular textures with continued orders for thin liquids and intermittent supervision.     FIM:  Comprehension Comprehension Mode: Auditory Comprehension: 6-Follows complex conversation/direction: With extra time/assistive device Expression Expression Mode: Verbal Expression: 6-Expresses complex ideas: With extra time/assistive device Social Interaction Social Interaction: 6-Interacts appropriately with others with  medication or extra time (anti-anxiety, antidepressant). Problem Solving Problem Solving: 6-Solves complex problems: With extra time Memory Memory: 6-More than reasonable amt of time  Pain Pain Assessment Pain Assessment: No/denies pain  Therapy/Group: Individual Therapy  [name] 06/13/2013, 4:30 PM

## 2013-06-14 ENCOUNTER — Inpatient Hospital Stay (HOSPITAL_COMMUNITY): Payer: Medicare Other | Admitting: Speech Pathology

## 2013-06-14 ENCOUNTER — Inpatient Hospital Stay (HOSPITAL_COMMUNITY): Payer: Medicare Other | Admitting: Occupational Therapy

## 2013-06-14 ENCOUNTER — Inpatient Hospital Stay (HOSPITAL_COMMUNITY): Payer: Medicare Other | Admitting: Physical Therapy

## 2013-06-14 DIAGNOSIS — I69991 Dysphagia following unspecified cerebrovascular disease: Secondary | ICD-10-CM

## 2013-06-14 DIAGNOSIS — G8110 Spastic hemiplegia affecting unspecified side: Secondary | ICD-10-CM

## 2013-06-14 DIAGNOSIS — I6340 Cerebral infarction due to embolism of unspecified cerebral artery: Secondary | ICD-10-CM

## 2013-06-14 LAB — GLUCOSE, CAPILLARY
Glucose-Capillary: 158 mg/dL — ABNORMAL HIGH (ref 70–99)
Glucose-Capillary: 101 mg/dL — ABNORMAL HIGH (ref 70–99)
Glucose-Capillary: 73 mg/dL (ref 70–99)
Glucose-Capillary: 130 mg/dL — ABNORMAL HIGH (ref 70–99)

## 2013-06-14 MED ORDER — HYDROCHLOROTHIAZIDE 10 MG/ML ORAL SUSPENSION: 6.2500 mg | Freq: Every day | ORAL | Status: DC

## 2013-06-14 MED ORDER — HYDROCHLOROTHIAZIDE 10 MG/ML ORAL SUSPENSION
6.2500 mg | Freq: Every day | ORAL | Status: DC
  Administered 2013-06-15 – 2013-06-16 (×2): 6.25 mg via ORAL
  Filled 2013-06-14 (×4): qty 1.25

## 2013-06-14 NOTE — Progress Notes (Signed)
Pt transported back to room by this PT.  Pt set up and transferred w/c > recliner with supervision-min A with min verbal cues for sequencing.  Pt positioned in recliner with LE elevated for edema management.  Pt left with all items within reach.    I have reviewed and I agree with the following treatment note.  Raylene Everts, PT, DPT

## 2013-06-14 NOTE — Progress Notes (Signed)
Occupational Therapy Session Note  Patient Details  Name: Donald [name] MRN: [id number] Date of Birth: 1945-12-09  Today's Date: 06/14/2013 Time: 0930-1030 Time Calculation (min): 60 min  Short Term Goals: Week 1:  OT Short Term Goal 1 (Week 1): Pt will maintain midline sitting balance at EOB for 2 mins with supevision while completing self-care task OT Short Term Goal 2 (Week 1): Pt will complete bathing with max assist of one caregiver OT Short Term Goal 3 (Week 1): Pt will complete UB dressing with mod assist OT Short Term Goal 4 (Week 1): Pt will complete LB dressing with max assist of one caregiver OT Short Term Goal 5 (Week 1): Pt will complete toilet transfer squat pivot with max assist of one caregiver  Skilled Therapeutic Interventions/Progress Updates:    Pt seen for ADL retraining with focus on bed mobility, static sitting balance, sit <> stand, transfers, and functional use of LUE during self-care task of bathing and dressing. Pt required mod assist supine to sit this session with stabilization at trunk while pt focusing on advancing LLE to EOB. Pt with static sitting balance supervision throughout UB bathing and dressing and close supervision with occasional min assist during dynamic sitting when washing BLE and threading shorts. Pt able to thread both legs this session however required assist with donning shoes and elected to no wear socks this session.  Required +2 assist for sit > stand and to maintain static standing with therapist blocking LLE while second person assisted with pulling pants over hips. Encouraged pt to focus on straightening knee in standing.  Educated pt on crossing BLE over opposite knee to complete bathing, threading shorts, and donning socks. Squat pivot transfer to Lt with max assist this session. Educated on towel scrunches with washcloth to focus on increasing grasp and release.  Encouraged use of Rt hand to assist with finger extension as  needed.  Therapy Documentation Precautions:  Precautions Precautions: Fall Restrictions Weight Bearing Restrictions: No General:   Vital Signs: Therapy Vitals BP: 168/81 mmHg Patient Position, if appropriate: Sitting Pain:  Pt with no c/o pain this session.  See FIM for current functional status  Therapy/Group: Individual Therapy  Simonne Come 06/14/2013, 10:30 AM

## 2013-06-14 NOTE — Progress Notes (Signed)
Speech Language Pathology Daily Session Note & Discharge Summary  Patient Details  Name: Donald [name] MRN: [id number] Date of Birth: Jan 14, 1946  Today's Date: 06/14/2013 Time: 1440-1500 Time Calculation (min): 20 min  Short Term Goals: Week 1: SLP Short Term Goal 1 (Week 1): Pt will consume Dys 3 textures and thin liquids with supervision level cues for utilization of safe swallowing strategies SLP Short Term Goal 2 (Week 1): Pt will demonstrate adequate masticaiton and oral clearance with regular textures with supervision to assess readiness for diet advancement SLP Short Term Goal 3 (Week 1): Pt will adequately perform oral motor exercises with supervision level cues SLP Short Term Goal 4 (Week 1): Pt will utilize speech intelligibility strategies at the sentence level with supervision level cues   Skilled Therapeutic Interventions: Skilled treatment session focused on finalizing education regarding dysphagia and dysarthria goals.  Patient is Independent with recall of previously taught information and Mod I for carryout.  SLP provided outpatient SLP services as an option for follow-up as needed and patient verbalized understanding of information.  As a result, goals met and no further skilled SLP services are warranted at this time.    FIM:  Comprehension Comprehension Mode: Auditory Comprehension: 6-Follows complex conversation/direction: With extra time/assistive device Expression Expression Mode: Verbal Expression: 6-Expresses complex ideas: With extra time/assistive device Social Interaction Social Interaction: 6-Interacts appropriately with others with medication or extra time (anti-anxiety, antidepressant). Problem Solving Problem Solving: 6-Solves complex problems: With extra time Memory Memory: 7-Complete Independence: No helper  Pain Pain Assessment Pain Assessment: No/denies pain  Therapy/Group: Individual Therapy   Speech Language Pathology Discharge  Summary  Patient Details  Name: Donald [name] MRN: [id number] Date of Birth: [birth year]  Today's Date: 06/14/2013  Patient has met 2 of 2 long term goals.  Patient to discharge at overall Modified Independent level.  Reasons goals not met: n/a   Clinical Impression/Discharge Summary: Patient met 2 out of 2 long term goals during his CIR stay due to gains in diet advancement and toleration as well as speech intelligibility.  Patient progressed from a Supervision level to a Mod I level and is able to manage both his mild dysarthria and dysphagia on his own as a result, no further skilled SLP services are warranted.      Care Partner:  Caregiver Able to Provide Assistance: Yes    Recommendation:  None     Equipment: none   Reasons for discharge: Treatment goals met   Patient/Family Agrees with Progress Made and Goals Achieved: Yes   See FIM for current functional status  Carmelia Roller., CCC-SLP [phone]  [place] 06/14/2013, 4:22 PM

## 2013-06-14 NOTE — Progress Notes (Signed)
Physical Therapy Weekly Progress Note  Patient Details  Name: Donald [name] MRN: [id number] Date of Birth: 1946/03/07  Today's Date: 06/14/2013 Time: 1100-1157, 1330-1415  Time Calculation (min): 57 min and 45 min  Patient has met 2 of 3 short term goals.  Pt has made excellent progress with transfers and is able to perform lateral scoot transfers to R and L with supervision-min A. Pt demonstrates improved bed mobility and ability to adequately weight shift to perform side lying>sit, but continues to require assistance with placement of LLE. Pt currently requires +2 A for sit<>stand and gait with Maxisky and Harmon Pier walker.   Patient continues to demonstrate the following deficits: L hemiparesis, trunk control, static and dynamic standing balance, motor control, motor planning, gait, safety awareness, L inattention and therefore will continue to benefit from skilled PT intervention to enhance overall performance with balance, postural control, ability to compensate for deficits, functional use of  left upper extremity and left lower extremity, awareness and coordination.  Patient progressing toward long term goals..  Continue plan of care.  PT Short Term Goals Week 1:  PT Short Term Goal 1 (Week 1): Pt will perform sit<>supine with mod A PT Short Term Goal 1 - Progress (Week 1): Met PT Short Term Goal 2 (Week 1): Pt will transfer bed<>chair max A +1 PT Short Term Goal 2 - Progress (Week 1): Met PT Short Term Goal 3 (Week 1): Pt will perform sit<>stand max A +1 PT Short Term Goal 3 - Progress (Week 1): Not met Week 2:  PT Short Term Goal 1 (Week 2): Pt will perform sit<>supine with min A PT Short Term Goal 2 (Week 2): Pt will perform sit<>stand with mod A PT Short Term Goal 3 (Week 2): Pt will propell w/c 150 feet with supervision PT Short Term Goal 4 (Week 2): Pt will ambulate 25 ft with max A +1  Skilled Therapeutic Interventions/Progress Updates:    AM session: Pt received seated in  w/c. Pt propelled w/c to therapy gym with min A. Pt demonstrated improved attention to L and awareness of obstacles this session. Pt transferred w/c<>Nustep supervision. Pt performed Nustep with BLE x5 minutes for activation of LLE extensors. Verbal cues for relaxation of RLE as pt tended to continue to push with RLE when attempting to extend LLE. Pt demonstrated improved sequencing and control as pt continued. Pt transferred w/c>mat supervision with max verbal cues for appropriate chair set up (removing leg and arm rests). Pt ambulated 20 feet +2 with Maxisky and Harmon Pier walker. Manual facilitation for L hip and knee extension, weight shifting to R to clear LLE, anterior weight shift in terminal stance, upright trunk, and placement of LLE. Returned pt to room, pt left seated in w/c with needs in reach.  PM session: Pt received seated in w/c. Transported pt to therapy gym. Pt transferred w/c>mat supervision and able to identify steps and correct set up with min verbal cues. Donned L toe-off AFO to determine effectiveness for knee control and clearance of LLE. Pt ambulated 20 ft +2 with Maxisky and Harmon Pier walker. Manual facilitation for L hip and knee extension in stance, weight shifting to R, and upright trunk. Pt demonstrated improvement in L foot clearance and hip/knee extension in stance. Pt performed stand>sit +2 with increased control of descent noted.  Pt left with supervising PT to complete session.   Therapy Documentation Precautions:  Precautions Precautions: Fall Restrictions Weight Bearing Restrictions: No Pain: AM session: Pain Assessment: No/denies pain  PM session:  Pt c/o of back pain after sitting up in w/c for extended period. RN aware, repositioned/increased activity. Locomotion : Ambulation Ambulation/Gait Assistance: 1: +2 Total assist Wheelchair Mobility Distance: 150   See FIM for current functional status  Therapy/Group: Individual Therapy  Wright Gravely, [place] 06/14/2013, 12:41 PM

## 2013-06-14 NOTE — Progress Notes (Signed)
68 y.o. right-handed male with history of hypertension as well as diabetes mellitus. Patient is from [place] and was visiting family in [place]. Admitted 06/03/2013 of left-sided weakness and slurred speech. 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 Echocardiogram with ejection fraction 35% grade 1 diastolic dysfunction. CT angiogram head and neck with no proximal branch occlusion or stenosis. Patient did receive TPA. Neurology services consulted placed on Plavix for CVA prophylaxis. TEE showed no PFO or thrombus loop recorder just implanted to rule out arrhythmia as potential cause. Acute on chronic paranasal sinusitis identified on MRI of the brain placed on amoxicillin  Subjective/Complaints: Occ diarrhea Urinary hesitency Review of Systems - Negative except weakness on Left and as above  Objective: Vital Signs: Blood pressure 181/100, pulse 57, temperature 97.9 F (36.6 C), temperature source Oral, resp. rate 18, height 6\' 3"  (1.905 m), weight 121.564 kg (268 lb), SpO2 96.00%. No results found. Results for orders placed during the hospital encounter of 06/07/13 (from the past 72 hour(s))  GLUCOSE, CAPILLARY     Status: Abnormal   Collection Time    06/11/13 11:58 AM      Result Value Range   Glucose-Capillary 124 (*) 70 - 99 mg/dL  GLUCOSE, CAPILLARY     Status: None   Collection Time    06/11/13  5:03 PM      Result Value Range   Glucose-Capillary 83  70 - 99 mg/dL  GLUCOSE, CAPILLARY     Status: None   Collection Time    06/11/13  9:21 PM      Result Value Range   Glucose-Capillary 92  70 - 99 mg/dL   Comment 1 Notify RN    GLUCOSE, CAPILLARY     Status: Abnormal   Collection Time    06/12/13  7:31 AM      Result Value Range   Glucose-Capillary 125 (*) 70 - 99 mg/dL  GLUCOSE, CAPILLARY     Status: Abnormal   Collection Time     06/12/13 11:20 AM      Result Value Range   Glucose-Capillary 154 (*) 70 - 99 mg/dL  GLUCOSE, CAPILLARY     Status: Abnormal   Collection Time    06/12/13  4:43 PM      Result Value Range   Glucose-Capillary 121 (*) 70 - 99 mg/dL   Comment 1 Notify RN    GLUCOSE, CAPILLARY     Status: Abnormal   Collection Time    06/12/13  8:49 PM      Result Value Range   Glucose-Capillary 130 (*) 70 - 99 mg/dL  GLUCOSE, CAPILLARY     Status: Abnormal   Collection Time    06/13/13  7:18 AM      Result Value Range   Glucose-Capillary 118 (*) 70 - 99 mg/dL   Comment 1 Notify RN    GLUCOSE, CAPILLARY     Status: Abnormal   Collection Time    06/13/13 11:57 AM      Result Value Range   Glucose-Capillary 140 (*) 70 - 99 mg/dL  GLUCOSE, CAPILLARY     Status: Abnormal   Collection Time    06/13/13  4:46 PM      Result Value Range   Glucose-Capillary 103 (*) 70 - 99 mg/dL  GLUCOSE, CAPILLARY     Status: Abnormal   Collection Time    06/13/13  9:14 PM      Result Value Range   Glucose-Capillary 157 (*) 70 - 99 mg/dL  GLUCOSE, CAPILLARY     Status: Abnormal   Collection Time    06/14/13  6:49 AM      Result Value Range   Glucose-Capillary 158 (*) 70 - 99 mg/dL     HEENT: normal Cardio: RRR and no murmur Resp: CTA B/L and unlabored GI: BS positive and non distended Extremity:  Pulses positive and No Edema Skin:   Intact except R med hand abrasion, no drainage, left elbow normal Neuro: Alert/Oriented, Cranial Nerve Abnormalities left central 7, Abnormal Sensory absent left hand and foot, Abnormal Motor 3- Left shoulder abd, add, Bi, Tri and Thumb ext, 3- L Hip Add, 3-/5 hip flexion, 3-/5 KE, trace toe flexion, Dysarthric and Other poor lip closure on Left Musc/Skel:  Other Left shoulder sublux Gen NAD   Assessment/Plan: 1. Functional deficits secondary to R MCA embolic infarct  which require 3+ hours per day of interdisciplinary therapy in a comprehensive inpatient rehab  setting. Physiatrist is providing close team supervision and 24 hour management of active medical problems listed below. Physiatrist and rehab team continue to assess barriers to discharge/monitor patient progress toward functional and medical goals. Team conf in am FIM: FIM - Bathing Bathing Steps Patient Completed: Chest;Left Arm;Abdomen;Right upper leg;Left upper leg Bathing: 3: Mod-Patient completes 5-7 72f 10 parts or 50-74%  FIM - Upper Body Dressing/Undressing Upper body dressing/undressing steps patient completed: Thread/unthread right sleeve of pullover shirt/dresss;Put head through opening of pull over shirt/dress Upper body dressing/undressing: 3: Mod-Patient completed 50-74% of tasks FIM - Lower Body Dressing/Undressing Lower body dressing/undressing steps patient completed: Don/Doff right sock;Don/Doff left sock;Thread/unthread left pants leg Lower body dressing/undressing: 1: Two helpers  FIM - Toileting Toileting steps completed by patient: Performs perineal hygiene Toileting: 0: Activity did not occur  FIM - Radio producer Devices: Recruitment consultant Transfers: 0-Activity did not occur  FIM - Control and instrumentation engineer Devices: Arm rests;Bed rails Bed/Chair Transfer: 4: Supine > Sit: Min A (steadying Pt. > 75%/lift 1 leg);2: Bed > Chair or W/C: Max A (lift and lower assist)  FIM - Locomotion: Wheelchair Distance: 200 Locomotion: Wheelchair: 4: Travels 150 ft or more: maneuvers on rugs and over door sillls with minimal assistance (Pt.>75%) FIM - Locomotion: Ambulation Ambulation/Gait Assistance: Not tested (comment) Locomotion: Ambulation: 0: Activity did not occur  Comprehension Comprehension Mode: Auditory Comprehension: 6-Follows complex conversation/direction: With extra time/assistive device  Expression Expression Mode: Verbal Expression: 6-Expresses complex ideas: With extra time/assistive  device  Social Interaction Social Interaction: 6-Interacts appropriately with others with medication or extra time (anti-anxiety, antidepressant).  Problem Solving Problem Solving: 6-Solves complex problems: With extra time  Memory Memory: 6-More than reasonable amt of time  Medical Problem List and Plan:  1. 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 Loop recorder implanted 06/07/13.  2. DVT Prophylaxis/Anticoagulation: SCDs. Monitor for any signs of DVT  3. Pain Management: Tylenol as needed for  4. Neuropsych: This patient is capable of making decisions on his own behalf.  5. Hypertension. Norvasc 10 mg daily, Coreg 12.5 mg twice a day,increase Catapres 0.3 mg tid, lisinopril 40 mg daily. Monitor with increased mobility  6. Diabetes mellitus with peripheral neuropathy.CBGs generally good but elevation this am Hemoglobin A1c 6.2. DiaBeta 5 mg daily, Glucophage 1500 mg daily. Check blood sugars a.c. and at bedtime. Education by team  7. Sinusitis identified on MRI. , monitor off  abx  LOS (Days) 7 A FACE TO FACE EVALUATION WAS PERFORMED  Karley Pho E 06/14/2013, 8:40 AM

## 2013-06-15 ENCOUNTER — Inpatient Hospital Stay (HOSPITAL_COMMUNITY): Payer: Medicare Other | Admitting: Occupational Therapy

## 2013-06-15 ENCOUNTER — Inpatient Hospital Stay (HOSPITAL_COMMUNITY): Payer: Medicare Other | Admitting: Physical Therapy

## 2013-06-15 LAB — GLUCOSE, CAPILLARY
Glucose-Capillary: 133 mg/dL — ABNORMAL HIGH (ref 70–99)
Glucose-Capillary: 97 mg/dL (ref 70–99)
Glucose-Capillary: 94 mg/dL (ref 70–99)
Glucose-Capillary: 109 mg/dL — ABNORMAL HIGH (ref 70–99)

## 2013-06-15 NOTE — Progress Notes (Signed)
I have reviewed and I agree with the following treatment note.  Keyarra Rendall Hall, PT, DPT  

## 2013-06-15 NOTE — Patient Care Conference (Signed)
Inpatient RehabilitationTeam Conference and Plan of Care Update Date: 06/15/2013   Time: 11:30 Am    Patient Name: Donald [name]      Medical Record Number: [id number]  Date of Birth: 1946/01/23 Sex: Male         Room/Bed: 3T46F/6C12X-51 Payor Info: Payor: MEDICARE / Plan: MEDICARE PART A AND B / Product Type: *No Product type* /    Admitting Diagnosis: CVA  Admit Date/Time:  06/07/2013  6:02 PM Admission Comments: No comment available   Primary Diagnosis:  CVA (cerebral infarction) Principal Problem: CVA (cerebral infarction)  Patient Active Problem List   Diagnosis Date Noted  . Small vessel disease 06/07/2013  . Obesity, unspecified 06/07/2013  . CVA (cerebral infarction) 06/07/2013  . large right middle cerebral artery infarct, embolic 70/06/7492  . Hypertension 06/03/2013  . Diabetes 06/03/2013    Expected Discharge Date: Expected Discharge Date: 06/30/13  Team Members Present: Physician leading conference: Dr. Alysia Penna Social Worker Present: Ovidio Kin, LCSW Nurse Present: Nanine Means, RN PT Present: Raylene Everts, PT;Emily Rinaldo Cloud, PT OT Present: Simonne Come, Starling Manns, Maryella Shivers, OT SLP Present: Gunnar Fusi, SLP PPS Coordinator present : Daiva Nakayama, RN, CRRN     Current Status/Progress Goal Weekly Team Focus  Medical   trnaferring better , occ incont, poor awareness of deficits  100% cont  avoid bedpan use   Bowel/Bladder   continent of bowel and bladder LBM 1/14  mod I  continue to monitor patient   Swallow/Nutrition/ Hydration   regular textures and thin liquids  Mod I  education copleted and goals met   ADL's   mod assist bathing at bed level, +2 at sit > stand level, mod assist UB dressing, mod assist LB dressing +2 to pull up pants, min assist squat pivot to Rt, max assist to Lt  min assist overall  standing balance, transfers, LB dressing, LUE NM re-ed   Mobility   Supervision transfers, min A w/c mobility, +2 standing,  +2 gait with Maxisky and Harmon Pier walker  Min A overall  Standing balance, gait, LE NMR, safety awarness/attention to L   Communication   Mod I- Independent  Mod I  education completed and goals met   Safety/Cognition/ Behavioral Observations    No safety concerns        Pain   Tylenol 650 mg every 4 hours prn  less than 3   monitor for effectiveness   Skin   no breakdown noted  no breakdown   monitor patient and continue to educate patient      *See Care Plan and progress notes for long and short-term goals.  Barriers to Discharge: left neglect    Possible Resolutions to Barriers:  cont rehab, coordinate PT/OT RN    Discharge Planning/Teaching Needs:    Home with wife who can provide care, will need family education prior to discharge.       Team Discussion:  Making good progress-neglect showing more this week, running into things on his left.  Use bsc and bathroom instead of bedpan, voids better.  Pt directing his care.  Revisions to Treatment Plan:  D/C speech met goals   Continued Need for Acute Rehabilitation Level of Care: The patient requires daily medical management by a physician with specialized training in physical medicine and rehabilitation for the following conditions: Daily direction of a multidisciplinary physical rehabilitation program to ensure safe treatment while eliciting the highest outcome that is of practical value to the patient.: Yes Daily medical management  of patient stability for increased activity during participation in an intensive rehabilitation regime.: Yes Daily analysis of laboratory values and/or radiology reports with any subsequent need for medication adjustment of medical intervention for : Neurological problems  Elyn Krogh, [place] 06/16/2013, 2:40 PM

## 2013-06-15 NOTE — Progress Notes (Signed)
Occupational Therapy Weekly Progress Note  Patient Details  Name: Donald [name] MRN: [id number] Date of Birth: 04-04-1946  Today's Date: 06/15/2013 Time: 3559-7416 and 3845-3646 (co-tx with PT 1330-1400) Time Calculation (min): 60 min and 15 min  Patient has met 4 of 5 short term goals.  Pt is making steady progress towards goals.  He has progressed from +2 total assist with transfers to both Rt and Lt, to max assist of one when transferring to Lt side and min-mod assist with transferring to Rt.  Pt continues to overpower Lt side with strong Rt side during mobility, requiring verbal cues for control with movement.  Pt continues to require +2 for standing tasks secondary to LLE weakness and occasional Lt knee buckling and due to body habitus.  Pt has developed LUE mobility with loose gross grasp, elbow flexion to ~90 degrees, and minimal shoulder flexion, as well as scapular elevation/depression allowing pt to increase participation in self-care tasks of bathing and UB dressing. Pt continues to lack functional shoulder flexion/extension and finger extension.  Patient continues to demonstrate the following deficits: Lt hemiparesis, decreased trunk trunk control, impaired static and dynamic standing balance, motor control, motor planning, impaired safety awareness, Lt inattention and therefore will continue to benefit from skilled OT intervention to enhance overall performance with BADL and Reduce care partner burden.  Patient progressing toward long term goals..  Continue plan of care.  OT Short Term Goals Week 1:  OT Short Term Goal 1 (Week 1): Pt will maintain midline sitting balance at EOB for 2 mins with supevision while completing self-care task OT Short Term Goal 1 - Progress (Week 1): Met OT Short Term Goal 2 (Week 1): Pt will complete bathing with max assist of one caregiver OT Short Term Goal 2 - Progress (Week 1): Met OT Short Term Goal 3 (Week 1): Pt will complete UB dressing with  mod assist OT Short Term Goal 3 - Progress (Week 1): Met OT Short Term Goal 4 (Week 1): Pt will complete LB dressing with max assist of one caregiver OT Short Term Goal 4 - Progress (Week 1): Met OT Short Term Goal 5 (Week 1): Pt will complete toilet transfer squat pivot with max assist of one caregiver OT Short Term Goal 5 - Progress (Week 1): Progressing toward goal Week 2:  OT Short Term Goal 1 (Week 2): Pt will complete toilet transfer squat pivot with max assist of one caregiver OT Short Term Goal 2 (Week 2): Pt will complete transfer to tub bench with max assist of one caregiver OT Short Term Goal 3 (Week 2): Pt will complete UB dressing wth supervision and less than 10% cues OT Short Term Goal 4 (Week 2): Pt will complet LB dressing with mod assist of one caregiver OT Short Term Goal 5 (Week 2): Pt will wash Rt arm with LUE with min assist  Skilled Therapeutic Interventions/Progress Updates:    1) Pt seen for ADL retraining with focus on bed mobility, static sitting balance, sit <> stand, transfers, and functional use of LUE during self-care task of bathing and dressing. Pt educated on bed mobility with attempting sidelying to sit to increase safety and independence. Pt with static sitting balance supervision throughout UB bathing and dressing and close supervision with occasional min assist during dynamic sitting when washing BLE and threading shorts. Pt able to thread both legs of shorts this session and Lt shoe by crossing leg over opposite knee. Engaged in sit > stand x2 with focus on  increased control with weight shifting and maintaining standing while focusing on pulling pants over hips.  Pt required mod-max assist of one caregiver with standing this session and was able to stabilize while pulling shorts over Rt hip, while this therapist stabilized at Lt knee.  Encouraged pt to focus on straightening knee in standing. Squat pivot transfer to Lt with max assist this session. Pt completed  grooming in sitting with focus on use of LUE as gross assist with stabilizing items.  2) Pt seen for skilled co-tx with PT with focus on transfer training with patient and nurse tech for bed <> BSC transfers so that pt can transition from bed pan. Transfer first completed with PT and OT. Pt transferred w/c > bed to Rt with bed rails with pt performing 50% set up with max verbal cues for safe sequence and set up of w/c parts and attention to LLE placement during transfer. Performed actual transfer to bed min assist squat pivot with therapist guarding position of Lt knee. BSC placed at foot of bed; pt performed squat pivot bed <> BSC to Lt and Rt with min assist overall with verbal and visual cues for attention to LLE placement during transfer. Pt transitioned sit > stand from [facility] to simulate stand for clothing management, also discussed lateral leans for doffing pants as an option. Had nurse and nurse tech come and observe transfer and set up so that they can pass on in report. Pt performed transfer and sit <> stand again with min assist with mod-max verbal cues for safety and sequencing. Nurse and tech verbalized understanding. Pt returned to bed and sit > supine with min assit to rest until next session.  Therapy Documentation Precautions:  Precautions Precautions: Fall Restrictions Weight Bearing Restrictions: No Pain: Pain Assessment Pain Assessment: 0-10 Pain Score: 0-No pain Patients Stated Pain Goal: 3 Multiple Pain Sites: No  See FIM for current functional status  Therapy/Group: Individual Therapy and Co-Treatment  Aylanie Cubillos, [facility] 06/15/2013, 11:55 AM

## 2013-06-15 NOTE — Progress Notes (Signed)
Physical Therapy Session Note  Patient Details  Name: Donald [name] MRN: [id number] Date of Birth: 1946/04/10  Today's Date: 06/15/2013 Time: 9417-4081 Time Calculation (min): 60 min  Short Term Goals: Week 2:  PT Short Term Goal 1 (Week 2): Pt will perform sit<>supine with min A PT Short Term Goal 2 (Week 2): Pt will perform sit<>stand with mod A PT Short Term Goal 3 (Week 2): Pt will propell w/c 150 feet with supervision PT Short Term Goal 4 (Week 2): Pt will ambulate 25 ft with max A +1  Skilled Therapeutic Interventions/Progress Updates:    Pt received lying in bed. Pt performed supine>sit with HOB elevated supervision and lateral scoot transfer bed>w/c min A with verbal cues for LLE placement and safety awareness. Pt propelled w/c 150 ft to therapy gym supervision. Pt demonstrated poor safety awareness and problem solving during w/c mobility this session, requiring max verbal cues to problem solve navigating through doorway as well as for attention to LLE position. Pt attempted to begin scoot transfer w/c>mat with ~1 foot of space between w/c and mat. Pt required total A verbal cues to recognize safety concerns, stating "I think I could make it over that distance no problem." Repositioned w/c, pt performed lateral scoot transfer w/c>mat supervision. Pt ambulated 40 ft x1 and 74ft x1 +2A with Maxisky and Harmon Pier walker. Pt demonstrated improved extension and weight bearing through LLE this session and improved sequencing as pt continued to ambulate. Returned pt to room, pt transferred w/c>bed supervision and sit>supine supervision. Pt left in bed with needs in reach.   Therapy Documentation Precautions:  Precautions Precautions: Fall Restrictions Weight Bearing Restrictions: No Vital Signs: Therapy Vitals Temp: 94.4 F (34.7 C) Temp src: Oral Pulse Rate: 60 Resp: 18 BP: 174/78 mmHg Patient Position, if appropriate: Lying Oxygen Therapy SpO2: 100 % O2 Device: None (Room  air) Pain: Pain Assessment Pain Assessment: No/denies pain  See FIM for current functional status  Therapy/Group: Carpenter 06/15/2013, 4:37 PM

## 2013-06-15 NOTE — Progress Notes (Signed)
Social Work Patient ID: Donald [name], male   DOB: 05/23/1946, 67 y.o.   MRN: [id number] Met with pt to inform of team conference progressing toward goals of min level.  Pt pleased with his progress he has made since last week. Showing more neglect-running into things on left.  Will work toward discharge 1/29.  Wife home with respiratory issues 

## 2013-06-15 NOTE — Progress Notes (Signed)
Occupational Therapy Session Note  Patient Details  Name: Donald [name] MRN: [id number] Date of Birth: [birth year]  Today's Date: 06/15/2013 Time: 2671-2458 Time Calculation (min): 45 min  Short Term Goals: Week 2:  OT Short Term Goal 1 (Week 2): Pt will complete toilet transfer squat pivot with max assist of one caregiver OT Short Term Goal 2 (Week 2): Pt will complete transfer to tub bench with max assist of one caregiver OT Short Term Goal 3 (Week 2): Pt will complete UB dressing wth supervision and less than 10% cues OT Short Term Goal 4 (Week 2): Pt will complet LB dressing with mod assist of one caregiver OT Short Term Goal 5 (Week 2): Pt will wash Rt arm with LUE with min assist  Skilled Therapeutic Interventions/Progress Updates:  Patient resting in w/c upon arrival.  Engaged in NMR of left side and weight bearing.  Focused session on propelling w/c, w/c positioning to include lateral and anterior weight shifts for reciprocal scooting of hips forward and back in chair, postural control, anterior weight shifts while seated in w/c while sliding hands on table in front of him (right hand over left), left hand on towel to allow for AAROM of shoulder flex, horiz. AB and ADuction.  Heavy weight bearing into left hand with extended arm and into LLE during dynamic anterior and lateral weight shifts in sitting and in squating.  LUE AAROM and strengthening.  Patient required max cues to breathe while working as he has a tendency to hold his breath with all activity.  Patient easily distract during session (both internally and externally) and required max redirection.  Encouraged patient to work while he talks and he was unable.  Therapy Documentation Precautions:  Precautions Precautions: Fall Restrictions Weight Bearing Restrictions: No Pain: Denies pain  Therapy/Group: Individual Therapy  Donald [name] 06/15/2013, 1:19 PM

## 2013-06-15 NOTE — Progress Notes (Signed)
Physical Therapy Session Note  Patient Details  Name: Donald [name] MRN: [id number] Date of Birth: 08/11/1945  Today's Date: 06/15/2013 Time: 6269 (full time 1330-1400 cotreat with [facility])-1400 Time Calculation (min): 15 min  Short Term Goals: Week 1:  PT Short Term Goal 1 (Week 1): Pt will perform sit<>supine with mod A PT Short Term Goal 1 - Progress (Week 1): Met PT Short Term Goal 2 (Week 1): Pt will transfer bed<>chair max A +1 PT Short Term Goal 2 - Progress (Week 1): Met PT Short Term Goal 3 (Week 1): Pt will perform sit<>stand max A +1 PT Short Term Goal 3 - Progress (Week 1): Not met Week 2:  PT Short Term Goal 1 (Week 2): Pt will perform sit<>supine with min A PT Short Term Goal 2 (Week 2): Pt will perform sit<>stand with mod A PT Short Term Goal 3 (Week 2): Pt will propell w/c 150 feet with supervision PT Short Term Goal 4 (Week 2): Pt will ambulate 25 ft with max A +1  Skilled Therapeutic Interventions/Progress Updates:   Skilled PT/OT cotreat with focus on transfer training with patient and nurse tech for bed <> BSC transfers so that pt can transition from bed pan.  Transfer first completed with PT and OT.  Pt transferred w/c > bed to R with bed rails with pt performing 50% set up with max verbal cues for safe sequence and set up of w/c parts and attention to LLE placement during transfer.  Performed actual transfer to bed min A squat pivot with therapist guarding position of L knee.  BSC placed at foot of bed; pt performed squat pivot bed <> BSC to L and R with min A overall with verbal and visual cues for attention to LLE placement during transfer.  Pt transitioned sit > stand from [facility] with OT to simulate stand for clothing management.  Had nurse and nurse tech come and observe transfer and set up so that they can pass on in report.  Pt performed transfer and sit <> stand again with OT min A.  Nurse and tech verbalized understanding.  Pt returned to bed and sit > supine to rest  with min A.     Therapy Documentation Precautions:  Precautions Precautions: Fall Restrictions Weight Bearing Restrictions: No Vital Signs: Therapy Vitals Pulse Rate: 62 BP: 165/91 mmHg Patient Position, if appropriate: Sitting Pain: Pain Assessment Pain Assessment: No/denies pain  See FIM for current functional status  Therapy/Group: Co-Treatment  Raylene Everts Faucette 06/15/2013, 2:16 PM

## 2013-06-15 NOTE — Progress Notes (Signed)
68 y.o. right-handed male with history of hypertension as well as diabetes mellitus. Patient is from [place] and was visiting family in [place]. Admitted 06/03/2013 of left-sided weakness and slurred speech. 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 Echocardiogram with ejection fraction 93% grade 1 diastolic dysfunction. CT angiogram head and neck with no proximal branch occlusion or stenosis. Patient did receive TPA. Neurology services consulted placed on Plavix for CVA prophylaxis. TEE showed no PFO or thrombus loop recorder just implanted to rule out arrhythmia as potential cause. 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 rate 18, height 6' 3"  (1.905 m), weight 121.791 kg (268 lb 8 oz), SpO2 96.00%. No results found. Results for orders placed during the hospital encounter of 06/07/13 (from the past 72 hour(s))  GLUCOSE, CAPILLARY     Status: Abnormal   Collection Time    06/12/13 11:20 AM      Result Value Range   Glucose-Capillary 154 (*) 70 - 99 mg/dL  GLUCOSE, CAPILLARY     Status: Abnormal   Collection Time    06/12/13  4:43 PM      Result Value Range   Glucose-Capillary 121 (*) 70 - 99 mg/dL   Comment 1 Notify RN    GLUCOSE, CAPILLARY     Status: Abnormal   Collection Time    06/12/13  8:49 PM      Result Value Range   Glucose-Capillary 130 (*) 70 - 99 mg/dL  GLUCOSE, CAPILLARY     Status: Abnormal   Collection Time    06/13/13  7:18 AM      Result Value Range   Glucose-Capillary 118 (*) 70 -  99 mg/dL   Comment 1 Notify RN    GLUCOSE, CAPILLARY     Status: Abnormal   Collection Time    06/13/13 11:57 AM      Result Value Range   Glucose-Capillary 140 (*) 70 - 99 mg/dL  GLUCOSE, CAPILLARY     Status: Abnormal   Collection Time    06/13/13  4:46 PM      Result Value Range   Glucose-Capillary 103 (*) 70 - 99 mg/dL  GLUCOSE, CAPILLARY     Status: Abnormal   Collection Time    06/13/13  9:14 PM      Result Value Range   Glucose-Capillary 157 (*) 70 - 99 mg/dL  GLUCOSE, CAPILLARY     Status: Abnormal   Collection Time    06/14/13  6:49 AM      Result Value Range   Glucose-Capillary 158 (*) 70 - 99 mg/dL  GLUCOSE, CAPILLARY     Status: Abnormal   Collection Time    06/14/13 12:14 PM      Result Value Range   Glucose-Capillary 101 (*) 70 - 99 mg/dL   Comment 1 Notify RN    GLUCOSE, CAPILLARY     Status: None   Collection Time    06/14/13  4:55 PM      Result Value Range   Glucose-Capillary 73  70 - 99 mg/dL  GLUCOSE, CAPILLARY  Status: Abnormal   Collection Time    06/14/13  9:07 PM      Result Value Range   Glucose-Capillary 130 (*) 70 - 99 mg/dL  GLUCOSE, CAPILLARY     Status: Abnormal   Collection Time    06/15/13  7:12 AM      Result Value Range   Glucose-Capillary 133 (*) 70 - 99 mg/dL     HEENT: normal Cardio: RRR and no murmur Resp: CTA B/L and unlabored GI: BS positive and non distended Extremity:  Pulses positive and No Edema Skin:   Intact except R med hand abrasion, no drainage, left elbow normal Neuro: Alert/Oriented, Cranial Nerve Abnormalities left central 7, Abnormal Sensory absent left hand and foot, Abnormal Motor 3- Left shoulder abd, add, Bi, Tri and Thumb ext,0/5 finger ext 3- L Hip Add, 3-/5 hip flexion, 3-/5 KE, trace toe flexion, Dysarthric and Other poor lip closure on Left Musc/Skel:  Other Left shoulder sublux Gen NAD   Assessment/Plan: 1. Functional deficits secondary to R MCA embolic infarct  which require 3+ hours per day of  interdisciplinary therapy in a comprehensive inpatient rehab setting. Physiatrist is providing close team supervision and 24 hour management of active medical problems listed below. Physiatrist and rehab team continue to assess barriers to discharge/monitor patient progress toward functional and medical goals. Team conference today please see physician documentation under team conference tab, met with team face-to-face to discuss problems,progress, and goals. Formulized individual treatment plan based on medical history, underlying problem and comorbidities. FIM: FIM - Bathing Bathing Steps Patient Completed: Chest;Left Arm;Abdomen;Right upper leg;Left upper leg Bathing: 3: Mod-Patient completes 5-7 24f10 parts or 50-74%  FIM - Upper Body Dressing/Undressing Upper body dressing/undressing steps patient completed: Thread/unthread right sleeve of pullover shirt/dresss;Thread/unthread left sleeve of pullover shirt/dress;Put head through opening of pull over shirt/dress Upper body dressing/undressing: 4: Min-Patient completed 75 plus % of tasks FIM - Lower Body Dressing/Undressing Lower body dressing/undressing steps patient completed: Thread/unthread right pants leg;Thread/unthread left pants leg Lower body dressing/undressing: 1: Two helpers  FIM - Toileting Toileting steps completed by patient: Performs perineal hygiene Toileting: 0: Activity did not occur  FIM - TRadio producerDevices: BRecruitment consultantTransfers: 0-Activity did not occur  FIM - BControl and instrumentation engineerDevices: Arm rests;Bed rails Bed/Chair Transfer: 5: Bed > Chair or W/C: Supervision (verbal cues/safety issues);5: Chair or W/C > Bed: Supervision (verbal cues/safety issues)  FIM - Locomotion: Wheelchair Distance: 150 Locomotion: Wheelchair: 4: Travels 150 ft or more: maneuvers on rugs and over door sillls with minimal assistance (Pt.>75%) FIM - Locomotion:  Ambulation Locomotion: Ambulation Assistive Devices: MaxiSky;WEthelene HalAmbulation/Gait Assistance: 1: +2 Total assist Locomotion: Ambulation: 1: Two helpers  Comprehension Comprehension Mode: Auditory Comprehension: 6-Follows complex conversation/direction: With extra time/assistive device  Expression Expression Mode: Verbal Expression: 6-Expresses complex ideas: With extra time/assistive device  Social Interaction Social Interaction: 6-Interacts appropriately with others with medication or extra time (anti-anxiety, antidepressant).  Problem Solving Problem Solving: 6-Solves complex problems: With extra time  Memory Memory: 7-Complete Independence: No helper  Medical Problem List and Plan:  1. 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 Loop recorder implanted 06/07/13.  2. DVT Prophylaxis/Anticoagulation: SCDs. Monitor for any signs of DVT  3. Pain Management: Tylenol as needed for  4. Neuropsych: This patient is capable of making decisions on his own behalf.  5. Hypertension. Norvasc 10 mg daily, Coreg 12.5 mg twice a day,increase Catapres 0.3 mg tid, lisinopril 40 mg daily.  Monitor with increased mobility  6. Diabetes mellitus with peripheral neuropathy.CBGs generally good but elevation this am Hemoglobin A1c 6.2. DiaBeta 5 mg daily, Glucophage 1500 mg daily. Check blood sugars a.c. and at bedtime. Education by team  7. Sinusitis identified on MRI. , monitor off abx  LOS (Days) 8 A FACE TO FACE EVALUATION WAS PERFORMED  Gatha Mcnulty E 06/15/2013, 9:40 AM

## 2013-06-16 ENCOUNTER — Inpatient Hospital Stay (HOSPITAL_COMMUNITY): Payer: Medicare Other | Admitting: Occupational Therapy

## 2013-06-16 ENCOUNTER — Inpatient Hospital Stay (HOSPITAL_COMMUNITY): Payer: Medicare Other

## 2013-06-16 LAB — GLUCOSE, CAPILLARY
Glucose-Capillary: 127 mg/dL — ABNORMAL HIGH (ref 70–99)
Glucose-Capillary: 82 mg/dL (ref 70–99)
Glucose-Capillary: 109 mg/dL — ABNORMAL HIGH (ref 70–99)
Glucose-Capillary: 134 mg/dL — ABNORMAL HIGH (ref 70–99)

## 2013-06-16 MED ORDER — HYDROCHLOROTHIAZIDE 12.5 MG PO CAPS
12.5000 mg | ORAL_CAPSULE | Freq: Every day | ORAL | Status: DC
  Administered 2013-06-17 – 2013-06-19 (×3): 12.5 mg via ORAL
  Filled 2013-06-16 (×5): qty 1

## 2013-06-16 NOTE — Progress Notes (Signed)
Social Work [name], LCSW Social Worker Signed  Patient Care Conference Service date: 06/15/2013 2:21 PM  Inpatient RehabilitationTeam Conference and Plan of Care Update Date: 06/15/2013   Time: 11:30 Am     Patient Name: Donald [name]       Medical Record Number: [id number]   Date of Birth: 1945-12-21 Sex: Male         Room/Bed: 9F62Z/3Y86V-78 Payor Info: Payor: MEDICARE / Plan: MEDICARE PART A AND B / Product Type: *No Product type* /   Admitting Diagnosis: CVA   Admit Date/Time:  06/07/2013  6:02 PM Admission Comments: No comment available   Primary Diagnosis:  CVA (cerebral infarction) Principal Problem: CVA (cerebral infarction)    Patient Active Problem List     Diagnosis  Date Noted   .  Small vessel disease  06/07/2013   .  Obesity, unspecified  06/07/2013   .  CVA (cerebral infarction)  06/07/2013   .  large right middle cerebral artery infarct, embolic  46/96/2952   .  Hypertension  06/03/2013   .  Diabetes  06/03/2013     Expected Discharge Date: Expected Discharge Date: 06/30/13  Team Members Present: Physician leading conference: Dr. Alysia Penna Social Worker Present: Ovidio Kin, LCSW Nurse Present: Nanine Means, RN PT Present: Raylene Everts, PT;Emily Rinaldo Cloud, PT OT Present: Simonne Come, Starling Manns, Maryella Shivers, OT SLP Present: Gunnar Fusi, SLP PPS Coordinator present : Daiva Nakayama, RN, CRRN        Current Status/Progress  Goal  Weekly Team Focus   Medical     trnaferring better , occ incont, poor awareness of deficits  100% cont  avoid bedpan use   Bowel/Bladder     continent of bowel and bladder LBM 1/14  mod I  continue to monitor patient   Swallow/Nutrition/ Hydration     regular textures and thin liquids  Mod I  education copleted and goals met   ADL's     mod assist bathing at bed level, +2 at sit > stand level, mod assist UB dressing, mod assist LB dressing +2 to pull up pants, min assist squat pivot to Rt, max  assist to Lt  min assist overall  standing balance, transfers, LB dressing, LUE NM re-ed   Mobility     Supervision transfers, min A w/c mobility, +2 standing, +2 gait with Maxisky and Harmon Pier walker  Min A overall  Standing balance, gait, LE NMR, safety awarness/attention to L   Communication     Mod I- Independent  Mod I  education completed and goals met   Safety/Cognition/ Behavioral Observations    No safety concerns       Pain     Tylenol 650 mg every 4 hours prn  less than 3   monitor for effectiveness   Skin     no breakdown noted  no breakdown   monitor patient and continue to educate patient     *See Care Plan and progress notes for long and short-term goals.    Barriers to Discharge:  left neglect      Possible Resolutions to Barriers:    cont rehab, coordinate PT/OT RN      Discharge Planning/Teaching Needs:    Home with wife who can provide care, will need family education prior to discharge.       Team Discussion:    Making good progress-neglect showing more this week, running into things on his left.  Use bsc and bathroom instead of bedpan,  voids better.  Pt directing his care.   Revisions to Treatment Plan:    D/C speech met goals    Continued Need for Acute Rehabilitation Level of Care: The patient requires daily medical management by a physician with specialized training in physical medicine and rehabilitation for the following conditions: Daily direction of a multidisciplinary physical rehabilitation program to ensure safe treatment while eliciting the highest outcome that is of practical value to the patient.: Yes Daily medical management of patient stability for increased activity during participation in an intensive rehabilitation regime.: Yes Daily analysis of laboratory values and/or radiology reports with any subsequent need for medication adjustment of medical intervention for : Neurological problems  Durk Carmen, Gardiner Rhyme 06/16/2013, 2:40 PM          [name], [place] Worker Signed  Patient Care Conference Service date: 06/08/2013 1:42 PM  Inpatient RehabilitationTeam Conference and Plan of Care Update Date: 06/08/2013   Time: 11;15 am     Patient Name: Donald [name]       Medical Record Number: [id number]   Date of Birth: 1945-07-29 Sex: Male         Room/Bed: 1Z73V/6P01I-10 Payor Info: Payor: MEDICARE / Plan: MEDICARE PART A AND B / Product Type: *No Product type* /   Admitting Diagnosis: CVA   Admit Date/Time:  06/07/2013  6:02 PM Admission Comments: No comment available   Primary Diagnosis:  CVA (cerebral infarction) Principal Problem: CVA (cerebral infarction)    Patient Active Problem List     Diagnosis  Date Noted   .  Small vessel disease  06/07/2013   .  Obesity, unspecified  06/07/2013   .  CVA (cerebral infarction)  06/07/2013   .  large right middle cerebral artery infarct, embolic  30/13/1438   .  Hypertension  06/03/2013   .  Diabetes  06/03/2013     Expected Discharge Date: Expected Discharge Date: 06/30/13  Team Members Present: Physician leading conference: Dr. Alysia Penna Social Worker Present: Ovidio Kin, LCSW Nurse Present: Heather Roberts, RN PT Present: Raylene Everts, PT;Emily Parcell, PT OT Present: Antony Salmon, Jules Schick, OT PPS Coordinator present : Daiva Nakayama, RN, CRRN        Current Status/Progress  Goal  Weekly Team Focus   Medical     Constipation, severe weakness in RUE adn RLE as wellas Poor sensation  upgrae strength, regulate bowels  initiate therapy program, adjust meds   Bowel/Bladder     continent bowel and bladder loose bm this am LBM 1-07  mod I  monitor    Swallow/Nutrition/ Hydration     Dys 3 textures and thin liquids, intermittent supervision  Mod I  trials of regular textures, increase utilization of swallowing strategies   ADL's     max assist bathing and UB dressing, +2 LB dressing and squat pivot transfer to toilet, max assist sit <> stand  min  assist overall  sitting and standing balance, transfers, self-care retraining   Mobility     Min-mod A sitting balance, max A bed mobility, +2 transfers  Min A overall  Sitting balance, transfers, standing, LE NMR    Communication     mildly dysarthric speech  Mod I  utilization of speech intelligibility strategies   Safety/Cognition/ Behavioral Observations    No unsafe behaviors       Pain     tylenol 650 mg po prn every 4 hous   less than 3  monitor effectiveness of pain meds  Skin     dressing to loop recorder   now new breakdown   monitor skin and educate family on skin care      *See Care Plan and progress notes for long and short-term goals.    Barriers to Discharge:  heavy assist level, large pt      Possible Resolutions to Barriers:    cont rehab to upgrade to min A      Discharge Planning/Teaching Needs:    Home with wife providing the care-re-locating here and are making arrangements of where and the plan at discharge.    Team Discussion:    Goals-min level-poor sensation, r-gaze pref.  Loop recorder placed yesterday.  Evaluating in all areas   Revisions to Treatment Plan:    New eval    Continued Need for Acute Rehabilitation Level of Care: The patient requires daily medical management by a physician with specialized training in physical medicine and rehabilitation for the following conditions: Daily direction of a multidisciplinary physical rehabilitation program to ensure safe treatment while eliciting the highest outcome that is of practical value to the patient.: Yes Daily medical management of patient stability for increased activity during participation in an intensive rehabilitation regime.: Yes Daily analysis of laboratory values and/or radiology reports with any subsequent need for medication adjustment of medical intervention for : Neurological problems;Other  [name] 06/10/2013, 8:38 AM          Patient ID: [name], male   DOB:  [birth year], 68 y.o.   MRN: [id number]

## 2013-06-16 NOTE — Progress Notes (Signed)
68 y.o. right-handed male with history of hypertension as well as diabetes mellitus. Patient is from [place] and was visiting family in [place]. Admitted 06/03/2013 of left-sided weakness and slurred speech. 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 Echocardiogram with ejection fraction [id number] grade 1 diastolic dysfunction. CT angiogram head and neck with no proximal branch occlusion or stenosis. Patient did receive TPA. Neurology services consulted placed on Plavix for CVA prophylaxis. TEE showed no PFO or thrombus loop recorder just implanted to rule out arrhythmia as potential cause. Acute on chronic paranasal sinusitis identified on MRI of the brain placed on amoxicillin  Subjective/Complaints: No pain c/os slept ok , bowels normalizing Review of Systems - Negative except weakness on Left and as above  Objective: Vital Signs: Blood pressure 210/94, pulse 53, temperature 97.5 F (36.4 C), temperature source Oral, resp. rate 16, height 6\' 3"  (1.905 m), weight 121.791 kg (268 lb 8 oz), SpO2 99.00%. No results found. Results for orders placed during the hospital encounter of 06/07/13 (from the past 72 hour(s))  GLUCOSE, CAPILLARY     Status: Abnormal   Collection Time    06/13/13  4:46 PM      Result Value Range   Glucose-Capillary 103 (*) 70 - 99 mg/dL  GLUCOSE, CAPILLARY     Status: Abnormal   Collection Time    06/13/13  9:14 PM      Result Value Range   Glucose-Capillary 157 (*) 70 - 99 mg/dL  GLUCOSE, CAPILLARY     Status: Abnormal   Collection Time    06/14/13  6:49 AM      Result Value Range   Glucose-Capillary 158 (*) 70 - 99 mg/dL  GLUCOSE, CAPILLARY     Status: Abnormal   Collection Time    06/14/13 12:14 PM      Result Value Range   Glucose-Capillary 101 (*) 70 - 99 mg/dL   Comment 1 Notify RN    GLUCOSE, CAPILLARY     Status:  None   Collection Time    06/14/13  4:55 PM      Result Value Range   Glucose-Capillary 73  70 - 99 mg/dL  GLUCOSE, CAPILLARY     Status: Abnormal   Collection Time    06/14/13  9:07 PM      Result Value Range   Glucose-Capillary 130 (*) 70 - 99 mg/dL  GLUCOSE, CAPILLARY     Status: Abnormal   Collection Time    06/15/13  7:12 AM      Result Value Range   Glucose-Capillary 133 (*) 70 - 99 mg/dL  GLUCOSE, CAPILLARY     Status: None   Collection Time    06/15/13 11:28 AM      Result Value Range   Glucose-Capillary 97  70 - 99 mg/dL   Comment 1 Notify RN    GLUCOSE, CAPILLARY     Status: None   Collection Time    06/15/13  4:35 PM      Result Value Range   Glucose-Capillary 94  70 - 99 mg/dL  GLUCOSE, CAPILLARY     Status: Abnormal   Collection Time    06/15/13  8:27 PM      Result Value Range   Glucose-Capillary 109 (*) 70 - 99 mg/dL   Comment 1 Notify RN    GLUCOSE, CAPILLARY     Status: Abnormal  Collection Time    06/16/13  7:15 AM      Result Value Range   Glucose-Capillary 127 (*) 70 - 99 mg/dL   Comment 1 Notify RN    GLUCOSE, CAPILLARY     Status: None   Collection Time    06/16/13 12:06 PM      Result Value Range   Glucose-Capillary 82  70 - 99 mg/dL   Comment 1 Notify RN       HEENT: normal Cardio: RRR and no murmur Resp: CTA B/L and unlabored GI: BS positive and non distended Extremity:  Pulses positive and No Edema Skin:   Intact except R med hand abrasion, no drainage, left elbow normal, loop recorder incision healing well Neuro: Alert/Oriented, Cranial Nerve Abnormalities left central 7, Abnormal Sensory absent left hand and foot, Abnormal Motor 3- Left shoulder abd, add, Bi, Tri and Thumb ext,0/5 finger ext 3- L Hip Add, 3-/5 hip flexion, 3-/5 KE, trace toe flexion, Dysarthric and Other poor lip closure on Left, very poor motor control on Left Musc/Skel:  Other Left shoulder sublux Gen NAD   Assessment/Plan: 1. Functional deficits secondary to R  MCA embolic infarct  which require 3+ hours per day of interdisciplinary therapy in a comprehensive inpatient rehab setting. Physiatrist is providing close team supervision and 24 hour management of active medical problems listed below. Physiatrist and rehab team continue to assess barriers to discharge/monitor patient progress toward functional and medical goals.  FIM: FIM - Bathing Bathing Steps Patient Completed: Chest;Left Arm;Abdomen;Right upper leg;Left upper leg;Front perineal area;Buttocks Bathing: 3: Mod-Patient completes 5-7 30f 10 parts or 50-74%  FIM - Upper Body Dressing/Undressing Upper body dressing/undressing steps patient completed: Thread/unthread left sleeve of pullover shirt/dress;Put head through opening of pull over shirt/dress;Pull shirt over trunk Upper body dressing/undressing: 4: Min-Patient completed 75 plus % of tasks FIM - Lower Body Dressing/Undressing Lower body dressing/undressing steps patient completed: Thread/unthread left underwear leg;Thread/unthread left pants leg;Thread/unthread right pants leg Lower body dressing/undressing: 2: Max-Patient completed 25-49% of tasks  FIM - Toileting Toileting steps completed by patient: Performs perineal hygiene Toileting: 2: Max-Patient completed 1 of 3 steps  FIM - Radio producer Devices: Recruitment consultant Transfers: 4-To toilet/BSC: Min A (steadying Pt. > 75%);3-From toilet/BSC: Mod A (lift or lower assist)  FIM - Bed/Chair Transfer Bed/Chair Transfer Assistive Devices: Bed rails;HOB elevated Bed/Chair Transfer: 4: Supine > Sit: Min A (steadying Pt. > 75%/lift 1 leg);3: Bed > Chair or W/C: Mod A (lift or lower assist)  FIM - Locomotion: Wheelchair Distance: 150 Locomotion: Wheelchair: 5: Travels 150 ft or more: maneuvers on rugs and over door sills with supervision, cueing or coaxing FIM - Locomotion: Ambulation Locomotion: Ambulation Assistive Devices: Film/video editor Ambulation/Gait Assistance: 1: +2 Total assist Locomotion: Ambulation: 1: Two helpers  Comprehension Comprehension Mode: Auditory Comprehension: 6-Follows complex conversation/direction: With extra time/assistive device  Expression Expression Mode: Verbal Expression: 6-Expresses complex ideas: With extra time/assistive device  Social Interaction Social Interaction: 6-Interacts appropriately with others with medication or extra time (anti-anxiety, antidepressant).  Problem Solving Problem Solving: 6-Solves complex problems: With extra time  Memory Memory: 7-Complete Independence: No helper  Medical Problem List and Plan:  1. 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 Loop recorder implanted 06/07/13.  2. DVT Prophylaxis/Anticoagulation: SCDs. Monitor for any signs of DVT  3. Pain Management: Tylenol as needed for  4. Neuropsych: This patient is capable of making decisions on his own behalf.  5. Hypertension. Norvasc 10 mg  daily, Coreg 12.5 mg twice a day,increase Catapres 0.3 mg tid, lisinopril 40 mg daily. Monitor with increased mobility, HCTZ added as well  6. Diabetes mellitus with peripheral neuropathy.CBGs generally good but elevation this am Hemoglobin A1c 6.2. DiaBeta 5 mg daily, Glucophage 1500 mg daily. Check blood sugars a.c. and at bedtime. Education by team  7. Sinusitis identified on MRI. , monitor off abx  LOS (Days) 9 A FACE TO FACE EVALUATION WAS PERFORMED  Loye Vento E 06/16/2013, 12:15 PM

## 2013-06-16 NOTE — Progress Notes (Signed)
I have read and agree with attached treatment note.  Ileana Ladd, PT

## 2013-06-16 NOTE — Progress Notes (Signed)
Occupational Therapy Session Note  Patient Details  Name: Donald [name] MRN: [id number] Date of Birth: [birth year]  Today's Date: 06/16/2013 Time: [phone] and [phone] Time Calculation (min): 56 min and 45 min  Short Term Goals: Week 2:  OT Short Term Goal 1 (Week 2): Pt will complete toilet transfer squat pivot with max assist of one caregiver OT Short Term Goal 2 (Week 2): Pt will complete transfer to tub bench with max assist of one caregiver OT Short Term Goal 3 (Week 2): Pt will complete UB dressing wth supervision and less than 10% cues OT Short Term Goal 4 (Week 2): Pt will complet LB dressing with mod assist of one caregiver OT Short Term Goal 5 (Week 2): Pt will wash Rt arm with LUE with min assist  Skilled Therapeutic Interventions/Progress Updates:    1) Pt seen for ADL retraining with focus on sit <> stand, transfers, and functional use of LUE during self-care tasks of bathing and dressing.  Pt in bed finishing breakfast upon arrival.  Reports needing to have BM.  Performed squat pivot transfer to drop arm BSC with min-mod assist.  Mod assist sit > stand and to maintain standing balance while pt attempted to assist in pulling shorts down.  Hygiene completed with lateral leans in sitting on BSC.  Pt donned underwear by crossing LLE over Rt knee, but required assist to thread Rt leg this session.  Squat pivot BSC to w/c with mod assist due to requiring increased lift to transfer to w/c.  Pt completed bathing and dressing seated at sink with increased time for UB dressing secondary to decreased orientation of shirt and perceptual deficit and decreased sensation when attempting to don shirt on LUE.  Pt continues to thread BLE through Lt leg hole with decreased awareness of error requiring question cues to identify.  Sit > stand with min assist and mod assist to maintain standing while therapist assisted in pulling pants over Lt hip.  Use of LUE as stabilizer when opening deodorant and  toothpaste.  2) Pt seen for NM re-ed with focus on weight bearing through LUE followed by AAROM on table top.  Pt propelled w/c to therapy gym with no incidents of bumping into items on Lt.  At table performed towel glides with focus on shoulder flexion/extension and horizontal abduction/adduction.  Following initial towel glides, engaged in weight bearing through LUE in sitting when reaching across midline with RUE for appox 15 mins while engaging in table top activity.  Post weight bearing pt with increased shoulder flexion and decreased overcompensation at trunk.  Educated pt in focusing on increasing controlled movements with LUE with reaching and simulated washing of chest and RUE.   Therapy Documentation Precautions:  Precautions Precautions: Fall Restrictions Weight Bearing Restrictions: No General:   Vital Signs: Therapy Vitals Temp: 97.5 F (36.4 C) Temp src: Oral Pulse Rate: 53 Resp: 16 BP: 210/94 mmHg Patient Position, if appropriate: Lying Oxygen Therapy SpO2: 99 % O2 Device: None (Room air) Pain:  Pt with no c/o pain  See FIM for current functional status  Therapy/Group: Individual Therapy  Simonne Come 06/16/2013, 9:02 AM

## 2013-06-16 NOTE — Progress Notes (Signed)
Physical Therapy Session Note  Patient Details  Name: Donald [name] MRN: [id number] Date of Birth: 1946/03/16  Today's Date: 06/16/2013 Time: 1115-1200 Time Calculation (min): 45 min  Short Term Goals: Week 2:  PT Short Term Goal 1 (Week 2): Pt will perform sit<>supine with min A PT Short Term Goal 2 (Week 2): Pt will perform sit<>stand with mod A PT Short Term Goal 3 (Week 2): Pt will propell w/c 150 feet with supervision PT Short Term Goal 4 (Week 2): Pt will ambulate 25 ft with max A +1  Skilled Therapeutic Interventions/Progress Updates:    Pt received seated in w/c. Transported pt to therapy gym. Ambulated 10 ft with LiteGait +2A and L AFO donned. Stopped ambulation trial with LiteGait secondary to pt discomfort and difficulty with weight shift and placement of LLE. Decreased trunk control, control of LLE placement, and weight shift noted vs MaxiSky with Harmon Pier walker. Pt stated he did not feel comfortable without the UE support of Eva walker. Transitioned to pt directing set up for w/c<>mat transfer. Pt demonstrated improved safety awareness and correct positioning of w/c this session. Pt required min verbal cues to remove appropriate w/c parts prior to transfer. Pt performed lateral scoot transfer w/c<>mat supervision. Returned pt to room, pt transferred w/c>bed supervision and sit>supine supervision. Pt left in bed with needs in reach and physician present.   Therapy Documentation Precautions:  Precautions Precautions: Fall Restrictions Weight Bearing Restrictions: No Pain:  Pt denies pain   Locomotion : Ambulation Ambulation/Gait Assistance: 1: +2 Total assist   See FIM for current functional status  Therapy/Group: Individual Therapy  Simisola Sandles, [place] 06/16/2013, 12:51 PM

## 2013-06-16 NOTE — Progress Notes (Signed)
Occupational Therapy Session Note  Patient Details  Name: Donald [name] MRN: [id number] Date of Birth: 1945-10-24  Today's Date: 06/16/2013 Time: 1430-1520 Time Calculation (min): 50 min  Short Term Goals: Week 2:  OT Short Term Goal 1 (Week 2): Pt will complete toilet transfer squat pivot with max assist of one caregiver OT Short Term Goal 2 (Week 2): Pt will complete transfer to tub bench with max assist of one caregiver OT Short Term Goal 3 (Week 2): Pt will complete UB dressing wth supervision and less than 10% cues OT Short Term Goal 4 (Week 2): Pt will complet LB dressing with mod assist of one caregiver OT Short Term Goal 5 (Week 2): Pt will wash Rt arm with LUE with min assist  Skilled Therapeutic Interventions/Progress Updates:  Patient resting in bed upon arrival.  Engaged in bed mobility, transfers-bed>w/c><therapy mat, ambulation with Hannah Beat and Harmon Pier walker.  Focused session on neuro-muscular re-education of LLE and LUE to address LUE weight bearing through forearm/elbow in standing/ambulating, increased motor control, timing and sequencing, grading of movement, sustained activation, lateral weight shifting as well as anterior and posterior weight shifting.  Patient requires cues due to not attending to and managing his LUE as well as not engaging his LLE with all transitional movement.  Patient required +2 to ambulate 40 feet 2Xs with MaxiSky and Harmon Pier walker.  Patient required more assistance the first walk therefore, fewer vcs provided during the second walk and patient more automatic and fluid with movement. Patient required increased assistance with left knee control during swing phase of RLE.  Patient able to focus on task and limit his distractibility this session.  Therapy Documentation Precautions:  Precautions Precautions: Fall Restrictions Weight Bearing Restrictions: No Pain: Denies pain Other Treatments: Treatments Neuromuscular Facilitation: Left;Lower  Extremity;Activity to ;Upper Extremity   Therapy/Group: Individual Therapy with PT student  [place], [place] 06/16/2013, 5:17 PM

## 2013-06-17 ENCOUNTER — Inpatient Hospital Stay (HOSPITAL_COMMUNITY): Payer: Medicare Other | Admitting: Physical Therapy

## 2013-06-17 ENCOUNTER — Inpatient Hospital Stay (HOSPITAL_COMMUNITY): Payer: Medicare Other | Admitting: Occupational Therapy

## 2013-06-17 DIAGNOSIS — G8110 Spastic hemiplegia affecting unspecified side: Secondary | ICD-10-CM

## 2013-06-17 DIAGNOSIS — I6340 Cerebral infarction due to embolism of unspecified cerebral artery: Secondary | ICD-10-CM

## 2013-06-17 DIAGNOSIS — I69991 Dysphagia following unspecified cerebrovascular disease: Secondary | ICD-10-CM

## 2013-06-17 LAB — GLUCOSE, CAPILLARY
Glucose-Capillary: 144 mg/dL — ABNORMAL HIGH (ref 70–99)
Glucose-Capillary: 98 mg/dL (ref 70–99)
Glucose-Capillary: 70 mg/dL (ref 70–99)
Glucose-Capillary: 138 mg/dL — ABNORMAL HIGH (ref 70–99)

## 2013-06-17 LAB — BASIC METABOLIC PANEL
BUN: 12 mg/dL (ref 6–23)
CO2: 25 mEq/L (ref 19–32)
Calcium: 9 mg/dL (ref 8.4–10.5)
Chloride: 101 mEq/L (ref 96–112)
Creatinine, Ser: 0.77 mg/dL (ref 0.50–1.35)
GFR calc Af Amer: >90 mL/min (ref >90)
GFR calc non Af Amer: >90 mL/min (ref >90)
Glucose, Bld: 131 mg/dL — ABNORMAL HIGH (ref 70–99)
Potassium: 3.5 mEq/L — ABNORMAL LOW (ref 3.7–5.3)
Sodium: 141 mEq/L (ref 137–147)

## 2013-06-17 MED ORDER — POTASSIUM CHLORIDE CRYS ER 10 MEQ PO TBCR
10.0000 meq | EXTENDED_RELEASE_TABLET | Freq: Every day | ORAL | Status: DC
  Administered 2013-06-17 – 2013-06-20 (×4): 10 meq via ORAL
  Filled 2013-06-17 (×7): qty 1

## 2013-06-17 NOTE — Progress Notes (Signed)
Physical Therapy Session Note  Patient Details  Name: Donald [name] MRN: [id number] Date of Birth: 1946-04-24  Today's Date: 06/17/2013 Time: 1115-1204 Time Calculation (min): 49 min  Short Term Goals: Week 2:  PT Short Term Goal 1 (Week 2): Pt will perform sit<>supine with min A PT Short Term Goal 2 (Week 2): Pt will perform sit<>stand with mod A PT Short Term Goal 3 (Week 2): Pt will propell w/c 150 feet with supervision PT Short Term Goal 4 (Week 2): Pt will ambulate 25 ft with max A +1  Skilled Therapeutic Interventions/Progress Updates:    Pt received seated in w/c. Propelled pt to therapy gym. Pt ambulated 32 feet with Harmon Pier walker +2 A. Manual facilitation for LLE placement and knee stability, upright trunk posture, and weight bearing through LUE.Pt demonstrated decreased external rotation of LLE this session. Performed weight shifting and forward/retro stepping with bariatric RW with L hand splint +2 A. Manual facilitation for weight bearing through LLE, weight shift, decreasing trunk rotation, and neutral hip alignment. Pt transferred mat>w/c lateral scoot min A. Returned pt to room, pt left seated in w/c with needs in reach.   Therapy Documentation Precautions:  Precautions Precautions: Fall Restrictions Weight Bearing Restrictions: No   Pain:  No c/o of pain   Locomotion : Ambulation Ambulation/Gait Assistance: 1: +2 Total assist   See FIM for current functional status  Therapy/Group: Individual Therapy  Maycel Riffe, [place] 06/17/2013, 12:28 PM

## 2013-06-17 NOTE — Progress Notes (Signed)
Catapres 0.3 mg po given at this time.  Awaiting Norvasc from Pharmacy.

## 2013-06-17 NOTE — Progress Notes (Signed)
Dr. Letta Pate on unit 7M.  Notified of patient's BP 190/88; due Catapres 0.3 mg, Norvasc 10 mg, and Coreg 25 mg at 0800.  rder received  Give Catapres 0.3 mg and Norvasc 10 mg now.

## 2013-06-17 NOTE — Progress Notes (Signed)
Occupational Therapy Session Note  Patient Details  Name: Donald [name] MRN: [id number] Date of Birth: [birth year]  Today's Date: 06/17/2013 Time: 7588-3254 Time Calculation (min): 48 min  Skilled Therapeutic Interventions/Progress Updates:    Patient seen for 1:1 therapy this pm to address neuromuscular re-education to his left side. Patient still with right bias in sitting, although improving.  Patient able to utilize strategies learned in prior therapy sessions to engage his left lower extremity in sit to stand tasks.  Patient with significant left hip tightness, which limits active excursion to left as needed to scoot toward left.  Patient also required increased cueing to weight shift trunk forward with transitional movements.  Weight bearing through extended left arm with weight shifted toward left side.  Patient with report of pain "pulling" in left arm, and instructed to keep weight shifted to left, and tip ear to the left shoulder.  This alleviated pain during weight bearing.  Patient with tendency to off load weight back to right whenever allowed.  Patient assisted to sidelying then supine, to address isolated movement and control in shoulder, elbow, and forearm.  Patient needed frequent cueing to not hold breath during exertion.  Patient actually with most natura pattern of motion in left UE when eyes were closed.  Patient very pleased with therapy program and progress thus far.    Therapy Documentation Precautions:  Precautions Precautions: Fall Restrictions Weight Bearing Restrictions: No  Pain:  Pain with weight bearing, alleviated with repositioning    See FIM for current functional status  Therapy/Group: Individual Therapy  Mariah Milling 06/17/2013, 6:23 PM

## 2013-06-17 NOTE — Progress Notes (Signed)
I have reviewed and I agree with the following treatment note.  Raylene Everts, PT, DPT

## 2013-06-17 NOTE — Progress Notes (Signed)
68 y.o. right-handed male with history of hypertension as well as diabetes mellitus. Patient is from [place] and was visiting family in [place]. Admitted 06/03/2013 of left-sided weakness and slurred speech. 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 Echocardiogram with ejection fraction 73% grade 1 diastolic dysfunction. CT angiogram head and neck with no proximal branch occlusion or stenosis. Patient did receive TPA. Neurology services consulted placed on Plavix for CVA prophylaxis. TEE showed no PFO or thrombus loop recorder just implanted to rule out arrhythmia as potential cause. Acute on chronic paranasal sinusitis identified on MRI of the brain placed on amoxicillin  Subjective/Complaints: No pain c/os slept ok , bowels normalizing Review of Systems - Negative except weakness on Left and as above  Objective: Vital Signs: Blood pressure 190/88, pulse 65, temperature 97.9 F (36.6 C), temperature source Oral, resp. rate 17, height $RemoveBe'6\' 3"'ylkmrEhAL$  (1.905 m), weight 121.791 kg (268 lb 8 oz), SpO2 97.00%. No results found. Results for orders placed during the hospital encounter of 06/07/13 (from the past 72 hour(s))  GLUCOSE, CAPILLARY     Status: Abnormal   Collection Time    06/14/13 12:14 PM      Result Value Range   Glucose-Capillary 101 (*) 70 - 99 mg/dL   Comment 1 Notify RN    GLUCOSE, CAPILLARY     Status: None   Collection Time    06/14/13  4:55 PM      Result Value Range   Glucose-Capillary 73  70 - 99 mg/dL  GLUCOSE, CAPILLARY     Status: Abnormal   Collection Time    06/14/13  9:07 PM      Result Value Range   Glucose-Capillary 130 (*) 70 - 99 mg/dL  GLUCOSE, CAPILLARY     Status: Abnormal   Collection Time    06/15/13  7:12 AM      Result Value Range   Glucose-Capillary 133 (*) 70 - 99 mg/dL  GLUCOSE, CAPILLARY     Status: None   Collection Time    06/15/13 11:28 AM      Result Value Range   Glucose-Capillary 97  70 - 99 mg/dL   Comment 1 Notify RN    GLUCOSE, CAPILLARY     Status: None   Collection Time    06/15/13  4:35 PM      Result Value Range   Glucose-Capillary 94  70 - 99 mg/dL  GLUCOSE, CAPILLARY     Status: Abnormal   Collection Time    06/15/13  8:27 PM      Result Value Range   Glucose-Capillary 109 (*) 70 - 99 mg/dL   Comment 1 Notify RN    GLUCOSE, CAPILLARY     Status: Abnormal   Collection Time    06/16/13  7:15 AM      Result Value Range   Glucose-Capillary 127 (*) 70 - 99 mg/dL   Comment 1 Notify RN    GLUCOSE, CAPILLARY     Status: None   Collection Time    06/16/13 12:06 PM      Result Value Range   Glucose-Capillary 82  70 - 99 mg/dL   Comment 1 Notify RN    GLUCOSE, CAPILLARY     Status: Abnormal   Collection Time    06/16/13  4:25 PM      Result Value Range   Glucose-Capillary 109 (*)  70 - 99 mg/dL  GLUCOSE, CAPILLARY     Status: Abnormal   Collection Time    06/16/13  8:38 PM      Result Value Range   Glucose-Capillary 134 (*) 70 - 99 mg/dL   Comment 1 Notify RN    BASIC METABOLIC PANEL     Status: Abnormal   Collection Time    06/17/13  5:20 AM      Result Value Range   Sodium 141  137 - 147 mEq/L   Potassium 3.5 (*) 3.7 - 5.3 mEq/L   Chloride 101  96 - 112 mEq/L   CO2 25  19 - 32 mEq/L   Glucose, Bld 131 (*) 70 - 99 mg/dL   BUN 12  6 - 23 mg/dL   Creatinine, Ser 0.77  0.50 - 1.35 mg/dL   Calcium 9.0  8.4 - 10.5 mg/dL   GFR calc non Af Amer >90  >90 mL/min   GFR calc Af Amer >90  >90 mL/min   Comment: (NOTE)     The eGFR has been calculated using the CKD EPI equation.     This calculation has not been validated in all clinical situations.     eGFR's persistently <90 mL/min signify possible Chronic Kidney     Disease.  GLUCOSE, CAPILLARY     Status: Abnormal   Collection Time    06/17/13  7:20 AM      Result Value Range   Glucose-Capillary 144 (*) 70 - 99  mg/dL   Comment 1 Notify RN       HEENT: normal Cardio: RRR and no murmur Resp: CTA B/L and unlabored GI: BS positive and non distended Extremity:  Pulses positive and No Edema Skin:   Intact except R med hand abrasion, no drainage, left elbow normal, loop recorder incision healing well Neuro: Alert/Oriented, Cranial Nerve Abnormalities left central 7, Abnormal Sensory absent left hand and foot, Abnormal Motor 3- Left shoulder abd, add, Bi, Tri and Thumb ext,0/5 finger ext 3- L Hip Add, 3-/5 hip flexion, 3-/5 KE, trace toe flexion, Dysarthric and Other poor lip closure on Left, very poor motor control on Left Musc/Skel:  Other Left shoulder sublux Gen NAD   Assessment/Plan: 1. Functional deficits secondary to R MCA embolic infarct  which require 3+ hours per day of interdisciplinary therapy in a comprehensive inpatient rehab setting. Physiatrist is providing close team supervision and 24 hour management of active medical problems listed below. Physiatrist and rehab team continue to assess barriers to discharge/monitor patient progress toward functional and medical goals.  FIM: FIM - Bathing Bathing Steps Patient Completed: Chest;Left Arm;Abdomen;Right upper leg;Left upper leg;Front perineal area;Buttocks Bathing: 3: Mod-Patient completes 5-7 43f10 parts or 50-74%  FIM - Upper Body Dressing/Undressing Upper body dressing/undressing steps patient completed: Thread/unthread left sleeve of pullover shirt/dress;Put head through opening of pull over shirt/dress;Pull shirt over trunk Upper body dressing/undressing: 4: Min-Patient completed 75 plus % of tasks FIM - Lower Body Dressing/Undressing Lower body dressing/undressing steps patient completed: Thread/unthread left underwear leg;Thread/unthread left pants leg;Thread/unthread right pants leg Lower body dressing/undressing: 2: Max-Patient completed 25-49% of tasks  FIM - Toileting Toileting steps completed by patient: Performs perineal  hygiene Toileting: 2: Max-Patient completed 1 of 3 steps  FIM - TRadio producerDevices: BRecruitment consultantTransfers: 4-To toilet/BSC: Min A (steadying Pt. > 75%);3-From toilet/BSC: Mod A (lift or lower assist)  FIM - Bed/Chair Transfer Bed/Chair Transfer Assistive Devices: Bed rails;HOB elevated Bed/Chair Transfer: 5:  Sit > Supine: Supervision (verbal cues/safety issues);4: Bed > Chair or W/C: Min A (steadying Pt. > 75%);5: Chair or W/C > Bed: Supervision (verbal cues/safety issues)  FIM - Locomotion: Wheelchair Distance: 150 Locomotion: Wheelchair: 1: Total Assistance/staff pushes wheelchair (Pt<25%) FIM - Locomotion: Ambulation Locomotion: Ambulation Assistive Devices: Lite Gait Ambulation/Gait Assistance: 1: +2 Total assist Locomotion: Ambulation: 1: Two helpers  Comprehension Comprehension Mode: Auditory Comprehension: 6-Follows complex conversation/direction: With extra time/assistive device  Expression Expression Mode: Verbal Expression: 6-Expresses complex ideas: With extra time/assistive device  Social Interaction Social Interaction: 6-Interacts appropriately with others with medication or extra time (anti-anxiety, antidepressant).  Problem Solving Problem Solving: 6-Solves complex problems: With extra time  Memory Memory: 6-Assistive device: No helper  Medical Problem List and Plan:  1. 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 Loop recorder implanted 06/07/13. On plavix 2. DVT Prophylaxis/Anticoagulation: SCDs. Monitor for any signs of DVT  3. Pain Management: Tylenol as needed for  4. Neuropsych: This patient is capable of making decisions on his own behalf.  5. Hypertension. Norvasc 10 mg daily, Coreg 12.5 mg twice a day,increase Catapres 0.3 mg tid, lisinopril 40 mg daily. Monitor with increased mobility, HCTZ added as well  6. Diabetes mellitus with peripheral neuropathy.CBGs generally good but elevation this am Hemoglobin A1c 6.2.  DiaBeta 5 mg daily, Glucophage 1500 mg daily. Check blood sugars a.c. and at bedtime. Education by team  7. Sinusitis identified on MRI. , monitor off abx 8.  Hypo K, likely HCTZ, add KCL LOS (Days) 10 A FACE TO FACE EVALUATION WAS PERFORMED  KIRSTEINS,ANDREW E 06/17/2013, 7:47 AM

## 2013-06-17 NOTE — Progress Notes (Signed)
Occupational Therapy Session Note  Patient Details  Name: Donald [name] MRN: [id number] Date of Birth: Jun 09, 1945  Today's Date: 06/17/2013 Time: 1000-1100 and 1330-1415 Time Calculation (min): 60 min and 45 min  Short Term Goals: Week 2:  OT Short Term Goal 1 (Week 2): Pt will complete toilet transfer squat pivot with max assist of one caregiver OT Short Term Goal 2 (Week 2): Pt will complete transfer to tub bench with max assist of one caregiver OT Short Term Goal 3 (Week 2): Pt will complete UB dressing wth supervision and less than 10% cues OT Short Term Goal 4 (Week 2): Pt will complet LB dressing with mod assist of one caregiver OT Short Term Goal 5 (Week 2): Pt will wash Rt arm with LUE with min assist  Skilled Therapeutic Interventions/Progress Updates:    1) Pt seen for ADL retraining with focus on sit <> stand, transfers, and functional use of LUE during self-care tasks of bathing and dressing. Pt in bed upon arrival, performed squat/scoot pivot to w/c with manual facilitation at LLE to promote WB position to engage LLE in transfer.  Engaged in toilet and tub/shower transfers in ADL apartment with pt requiring manual facilitation at Lt knee with transfer and max verbal cues for safety and sequencing.  Pt completed bathing at seated level in shower with lateral lean to wash buttocks.  Pt continues to demonstrate decreased safety awareness with mobility.    2) Pt seen for 1:1 OT with focus on functional use of LUE.  Engaged in [place] of shoulder to begin, followed by reaching task at table top.  Encouraged pt to focus on motor control with reaching for cards with LUE.  Support provided at elbow to decrease gravity.  As session progressed pt with first finger extension, however unable to utilize it to assist in obtaining cards.  Educated pt on [facility] activities to complete over the weekend with focus on finger extension of first digit initially and then progressing to all digits. Pt able to  return demonstrate exercises.  Therapy Documentation Precautions:  Precautions Precautions: Fall Restrictions Weight Bearing Restrictions: No Pain:  Pt with no c/o pain  See FIM for current functional status  Therapy/Group: Individual Therapy  Simonne Come 06/17/2013, 12:19 PM

## 2013-06-18 ENCOUNTER — Inpatient Hospital Stay (HOSPITAL_COMMUNITY): Payer: Medicare Other | Admitting: Occupational Therapy

## 2013-06-18 LAB — GLUCOSE, CAPILLARY
Glucose-Capillary: 147 mg/dL — ABNORMAL HIGH (ref 70–99)
Glucose-Capillary: 93 mg/dL (ref 70–99)
Glucose-Capillary: 78 mg/dL (ref 70–99)
Glucose-Capillary: 75 mg/dL (ref 70–99)

## 2013-06-18 NOTE — Progress Notes (Signed)
68 y.o. right-handed male with history of hypertension as well as diabetes mellitus. Patient is from [place] and was visiting family in [place]. Admitted 06/03/2013 of left-sided weakness and slurred speech. 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 Echocardiogram with ejection fraction 37% grade 1 diastolic dysfunction. CT angiogram head and neck with no proximal branch occlusion or stenosis. Patient did receive TPA. Neurology services consulted placed on Plavix for CVA prophylaxis. TEE showed no PFO or thrombus loop recorder just implanted to rule out arrhythmia as potential cause. Acute on chronic paranasal sinusitis identified on MRI of the brain placed on amoxicillin  Subjective/Complaints: No pain c/os slept ok , transferred to the commode with 1 person assist last noc and had cont BM Review of Systems - Negative except weakness on Left and as above  Objective: Vital Signs: Blood pressure 167/82, pulse 52, temperature 98 F (36.7 C), temperature source Oral, resp. rate 19, height 6' 3"  (1.905 m), weight 121.791 kg (268 lb 8 oz), SpO2 96.00%. No results found. Results for orders placed during the hospital encounter of 06/07/13 (from the past 72 hour(s))  GLUCOSE, CAPILLARY     Status: None   Collection Time    06/15/13 11:28 AM      Result Value Range   Glucose-Capillary 97  70 - 99 mg/dL   Comment 1 Notify RN    GLUCOSE, CAPILLARY     Status: None   Collection Time    06/15/13  4:35 PM      Result Value Range   Glucose-Capillary 94  70 - 99 mg/dL  GLUCOSE, CAPILLARY     Status: Abnormal   Collection Time    06/15/13  8:27 PM      Result Value Range   Glucose-Capillary 109 (*) 70 - 99 mg/dL   Comment 1 Notify RN    GLUCOSE, CAPILLARY     Status: Abnormal   Collection Time    06/16/13  7:15 AM      Result Value Range    Glucose-Capillary 127 (*) 70 - 99 mg/dL   Comment 1 Notify RN    GLUCOSE, CAPILLARY     Status: None   Collection Time    06/16/13 12:06 PM      Result Value Range   Glucose-Capillary 82  70 - 99 mg/dL   Comment 1 Notify RN    GLUCOSE, CAPILLARY     Status: Abnormal   Collection Time    06/16/13  4:25 PM      Result Value Range   Glucose-Capillary 109 (*) 70 - 99 mg/dL  GLUCOSE, CAPILLARY     Status: Abnormal   Collection Time    06/16/13  8:38 PM      Result Value Range   Glucose-Capillary 134 (*) 70 - 99 mg/dL   Comment 1 Notify RN    BASIC METABOLIC PANEL     Status: Abnormal   Collection Time    06/17/13  5:20 AM      Result Value Range   Sodium 141  137 - 147 mEq/L   Potassium 3.5 (*) 3.7 - 5.3 mEq/L   Chloride 101  96 - 112 mEq/L   CO2 25  19 - 32 mEq/L   Glucose, Bld 131 (*) 70 - 99 mg/dL   BUN 12  6 - 23 mg/dL   Creatinine, Ser 0.77  0.50 - 1.35  mg/dL   Calcium 9.0  8.4 - 10.5 mg/dL   GFR calc non Af Amer >90  >90 mL/min   GFR calc Af Amer >90  >90 mL/min   Comment: (NOTE)     The eGFR has been calculated using the CKD EPI equation.     This calculation has not been validated in all clinical situations.     eGFR's persistently <90 mL/min signify possible Chronic Kidney     Disease.  GLUCOSE, CAPILLARY     Status: Abnormal   Collection Time    06/17/13  7:20 AM      Result Value Range   Glucose-Capillary 144 (*) 70 - 99 mg/dL   Comment 1 Notify RN    GLUCOSE, CAPILLARY     Status: None   Collection Time    06/17/13 12:02 PM      Result Value Range   Glucose-Capillary 98  70 - 99 mg/dL   Comment 1 Notify RN    GLUCOSE, CAPILLARY     Status: None   Collection Time    06/17/13  4:41 PM      Result Value Range   Glucose-Capillary 70  70 - 99 mg/dL  GLUCOSE, CAPILLARY     Status: Abnormal   Collection Time    06/17/13  9:22 PM      Result Value Range   Glucose-Capillary 138 (*) 70 - 99 mg/dL  GLUCOSE, CAPILLARY     Status: Abnormal   Collection Time     06/18/13  7:41 AM      Result Value Range   Glucose-Capillary 147 (*) 70 - 99 mg/dL     HEENT: normal Cardio: RRR and no murmur Resp: CTA B/L and unlabored GI: BS positive and non distended Extremity:  Pulses positive and No Edema Skin:   Intact except R med hand abrasion, no drainage, left elbow normal, loop recorder incision healing well Neuro: Alert/Oriented, Cranial Nerve Abnormalities left central 7, Abnormal Sensory absent left hand and foot, Abnormal Motor 3- Left shoulder abd, add, Bi, Tri and Thumb ext,0/5 finger ext 3- L Hip Add, 3-/5 hip flexion, 3-/5 KE, trace toe flexion, Dysarthric and Other poor lip closure on Left, very poor motor control on Left Musc/Skel:  Other Left shoulder sublux Gen NAD   Assessment/Plan: 1. Functional deficits secondary to R MCA embolic infarct  which require 3+ hours per day of interdisciplinary therapy in a comprehensive inpatient rehab setting. Physiatrist is providing close team supervision and 24 hour management of active medical problems listed below. Physiatrist and rehab team continue to assess barriers to discharge/monitor patient progress toward functional and medical goals.  FIM: FIM - Bathing Bathing Steps Patient Completed: Chest;Left Arm;Abdomen;Right upper leg;Left upper leg;Front perineal area;Buttocks Bathing: 3: Mod-Patient completes 5-7 48f10 parts or 50-74%  FIM - Upper Body Dressing/Undressing Upper body dressing/undressing steps patient completed: Thread/unthread left sleeve of pullover shirt/dress;Put head through opening of pull over shirt/dress;Pull shirt over trunk Upper body dressing/undressing: 4: Min-Patient completed 75 plus % of tasks FIM - Lower Body Dressing/Undressing Lower body dressing/undressing steps patient completed: Thread/unthread left underwear leg;Thread/unthread left pants leg;Thread/unthread right pants leg Lower body dressing/undressing: 2: Max-Patient completed 25-49% of tasks  FIM -  Toileting Toileting steps completed by patient: Performs perineal hygiene Toileting: 2: Max-Patient completed 1 of 3 steps  FIM - TRadio producerDevices: Elevated toilet seat;Grab bars Toilet Transfers: 3-To toilet/BSC: Mod A (lift or lower assist);3-From toilet/BSC: Mod A (lift or lower assist)  FIM - Bed/Chair Transfer Bed/Chair Transfer Assistive Devices: Bed rails;HOB elevated Bed/Chair Transfer: 4: Bed > Chair or W/C: Min A (steadying Pt. > 75%)  FIM - Locomotion: Wheelchair Distance: 150 Locomotion: Wheelchair: 1: Total Assistance/staff pushes wheelchair (Pt<25%) FIM - Locomotion: Ambulation Locomotion: Ambulation Assistive Devices: Ethelene Hal Ambulation/Gait Assistance: 1: +2 Total assist Locomotion: Ambulation: 1: Two helpers  Comprehension Comprehension Mode: Auditory Comprehension: 6-Follows complex conversation/direction: With extra time/assistive device  Expression Expression Mode: Verbal Expression: 6-Expresses complex ideas: With extra time/assistive device  Social Interaction Social Interaction: 6-Interacts appropriately with others with medication or extra time (anti-anxiety, antidepressant).  Problem Solving Problem Solving: 6-Solves complex problems: With extra time  Memory Memory: 6-Assistive device: No helper  Medical Problem List and Plan:  1. 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 Loop recorder implanted 06/07/13. On plavix 2. DVT Prophylaxis/Anticoagulation: SCDs. Monitor for any signs of DVT  3. Pain Management: Tylenol as needed for  4. Neuropsych: This patient is capable of making decisions on his own behalf.  5. Hypertension. Norvasc 10 mg daily, Coreg 12.5 mg twice a day,increase Catapres 0.3 mg tid, lisinopril 40 mg daily. Monitor with increased mobility, HCTZ added as well  6. Diabetes mellitus with peripheral neuropathy.CBGs generally good but elevation this am Hemoglobin A1c 6.2. DiaBeta 5 mg daily, Glucophage 1500  mg daily. Check blood sugars a.c. and at bedtime. Education by team  7. Sinusitis identified on MRI. , monitor off abx 8.  Hypo K, likely HCTZ, add KCL LOS (Days) 11 A FACE TO FACE EVALUATION WAS PERFORMED  Tilden Broz E 06/18/2013, 8:51 AM

## 2013-06-18 NOTE — Progress Notes (Signed)
Occupational Therapy Session Note  Patient Details  Name: [name] MRN: [id number] Date of Birth: 1946-03-27  Today's Date: 06/18/2013 Time: 4193-7902 Time Calculation (min): 37 min  Skilled Therapeutic Interventions/Progress Updates:    Pt transferred from bedside chair to wheelchair stand pivot with mod facilitation.  Down in the gym transferred to mat with min assist squat pivot but with decreased weightshift to the left for activation of the LLE.Marland Kitchen  Worked initially using the UE ranger with emphasis on shoulder external rotation and maintaining trunk in neutral position.  Pt usually leaning to the right side to attempt compensatory strategy to help influence LUE movement.  Pt instruction on quality of movement and needed min assist to perform simple external rotation without shoulder and trunk compensation.  Also utilized tilted stool for shoulder flexion.  Once again focus on shoulder flexion without compensation and with decreased activation of the pectoral.  Pt able to perform active movement to push stool forward but needs grading in the amount the stool is pushed to help eliminate horizontal adduction with shoulder flexion.  Finished session by having pt attempt AROM while having LUE placed on washcloth on amputee board in his lap.  He was able to initiate some active external rotation but not as strong as internal movements.  Issued both to be used in his room with emphasis on external rotation of the shoulder.   Therapy Documentation Precautions:  Precautions Precautions: Fall Precaution Comments: LUE and LLE hemiparesis Restrictions Weight Bearing Restrictions: No  Pain: Pain Assessment Faces Pain Scale: Hurts a little bit Pain Type: Acute pain Pain Location: Shoulder Pain Orientation: Left Pain Intervention(s): Repositioned ADL: See FIM for current functional status  Therapy/Group: Individual Therapy  Mirabel Ahlgren OTR/L 06/18/2013, 4:11 PM

## 2013-06-19 ENCOUNTER — Ambulatory Visit (HOSPITAL_COMMUNITY): Payer: Medicare Other | Admitting: *Deleted

## 2013-06-19 LAB — GLUCOSE, CAPILLARY
Glucose-Capillary: 123 mg/dL — ABNORMAL HIGH (ref 70–99)
Glucose-Capillary: 134 mg/dL — ABNORMAL HIGH (ref 70–99)
Glucose-Capillary: 112 mg/dL — ABNORMAL HIGH (ref 70–99)
Glucose-Capillary: 156 mg/dL — ABNORMAL HIGH (ref 70–99)

## 2013-06-19 NOTE — Progress Notes (Signed)
68 y.o. right-handed male with history of hypertension as well as diabetes mellitus. Patient is from [place] and was visiting family in [place]. Admitted 06/03/2013 of left-sided weakness and slurred speech. 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 Echocardiogram with ejection fraction 37% grade 1 diastolic dysfunction. CT angiogram head and neck with no proximal branch occlusion or stenosis. Patient did receive TPA. Neurology services consulted placed on Plavix for CVA prophylaxis. TEE showed no PFO or thrombus loop recorder just implanted to rule out arrhythmia as potential cause. Acute on chronic paranasal sinusitis identified on MRI of the brain placed on amoxicillin  Subjective/Complaints: No pain c/os slept ok , transferred to the commode with 1 person assist last noc and had cont BM Asking about diet , trying to keep weight off Review of Systems - Negative except weakness on Left and as above  Objective: Vital Signs: Blood pressure 167/76, pulse 57, temperature 97.4 F (36.3 C), temperature source Oral, resp. rate 19, height $RemoveBe'6\' 3"'AmkKDzjak$  (1.905 m), weight 121.791 kg (268 lb 8 oz), SpO2 96.00%. No results found. Results for orders placed during the hospital encounter of 06/07/13 (from the past 72 hour(s))  GLUCOSE, CAPILLARY     Status: None   Collection Time    06/16/13 12:06 PM      Result Value Range   Glucose-Capillary 82  70 - 99 mg/dL   Comment 1 Notify RN    GLUCOSE, CAPILLARY     Status: Abnormal   Collection Time    06/16/13  4:25 PM      Result Value Range   Glucose-Capillary 109 (*) 70 - 99 mg/dL  GLUCOSE, CAPILLARY     Status: Abnormal   Collection Time    06/16/13  8:38 PM      Result Value Range   Glucose-Capillary 134 (*) 70 - 99 mg/dL   Comment 1 Notify RN    BASIC METABOLIC PANEL     Status: Abnormal   Collection Time     06/17/13  5:20 AM      Result Value Range   Sodium 141  137 - 147 mEq/L   Potassium 3.5 (*) 3.7 - 5.3 mEq/L   Chloride 101  96 - 112 mEq/L   CO2 25  19 - 32 mEq/L   Glucose, Bld 131 (*) 70 - 99 mg/dL   BUN 12  6 - 23 mg/dL   Creatinine, Ser 0.77  0.50 - 1.35 mg/dL   Calcium 9.0  8.4 - 10.5 mg/dL   GFR calc non Af Amer >90  >90 mL/min   GFR calc Af Amer >90  >90 mL/min   Comment: (NOTE)     The eGFR has been calculated using the CKD EPI equation.     This calculation has not been validated in all clinical situations.     eGFR's persistently <90 mL/min signify possible Chronic Kidney     Disease.  GLUCOSE, CAPILLARY     Status: Abnormal   Collection Time    06/17/13  7:20 AM      Result Value Range   Glucose-Capillary 144 (*) 70 - 99 mg/dL   Comment 1 Notify RN    GLUCOSE, CAPILLARY     Status: None   Collection Time    06/17/13 12:02 PM      Result Value Range   Glucose-Capillary 98  70 - 99 mg/dL  Comment 1 Notify RN    GLUCOSE, CAPILLARY     Status: None   Collection Time    06/17/13  4:41 PM      Result Value Range   Glucose-Capillary 70  70 - 99 mg/dL  GLUCOSE, CAPILLARY     Status: Abnormal   Collection Time    06/17/13  9:22 PM      Result Value Range   Glucose-Capillary 138 (*) 70 - 99 mg/dL  GLUCOSE, CAPILLARY     Status: Abnormal   Collection Time    06/18/13  7:41 AM      Result Value Range   Glucose-Capillary 147 (*) 70 - 99 mg/dL  GLUCOSE, CAPILLARY     Status: None   Collection Time    06/18/13 12:04 PM      Result Value Range   Glucose-Capillary 93  70 - 99 mg/dL  GLUCOSE, CAPILLARY     Status: None   Collection Time    06/18/13  5:13 PM      Result Value Range   Glucose-Capillary 78  70 - 99 mg/dL  GLUCOSE, CAPILLARY     Status: None   Collection Time    06/18/13  8:48 PM      Result Value Range   Glucose-Capillary 75  70 - 99 mg/dL     HEENT: normal Cardio: RRR and no murmur Resp: CTA B/L and unlabored GI: BS positive and non  distended Extremity:  Pulses positive and No Edema Skin:   Intact except R med hand abrasion, no drainage, left elbow normal, loop recorder incision healing well Neuro: Alert/Oriented, Cranial Nerve Abnormalities left central 7, Abnormal Sensory absent left hand and foot, Abnormal Motor 3- Left shoulder abd, add, Bi, Tri and Thumb ext,0/5 finger ext 3- L Hip Add, 3-/5 hip flexion, 3-/5 KE, trace toe flexion, Dysarthric and Other poor lip closure on Left, very poor motor control on Left Musc/Skel:  Other Left shoulder sublux Gen NAD   Assessment/Plan: 1. Functional deficits secondary to R MCA embolic infarct  which require 3+ hours per day of interdisciplinary therapy in a comprehensive inpatient rehab setting. Physiatrist is providing close team supervision and 24 hour management of active medical problems listed below. Physiatrist and rehab team continue to assess barriers to discharge/monitor patient progress toward functional and medical goals.  FIM: FIM - Bathing Bathing Steps Patient Completed: Chest;Left Arm;Abdomen;Right upper leg;Left upper leg;Front perineal area;Buttocks Bathing: 3: Mod-Patient completes 5-7 29f10 parts or 50-74%  FIM - Upper Body Dressing/Undressing Upper body dressing/undressing steps patient completed: Thread/unthread left sleeve of pullover shirt/dress;Put head through opening of pull over shirt/dress;Pull shirt over trunk Upper body dressing/undressing: 4: Min-Patient completed 75 plus % of tasks FIM - Lower Body Dressing/Undressing Lower body dressing/undressing steps patient completed: Thread/unthread left underwear leg;Thread/unthread left pants leg;Thread/unthread right pants leg Lower body dressing/undressing: 2: Max-Patient completed 25-49% of tasks  FIM - Toileting Toileting steps completed by patient: Performs perineal hygiene Toileting: 2: Max-Patient completed 1 of 3 steps  FIM - TRadio producerDevices: Elevated  toilet seat;Grab bars Toilet Transfers: 3-To toilet/BSC: Mod A (lift or lower assist);3-From toilet/BSC: Mod A (lift or lower assist)  FIM - Bed/Chair Transfer Bed/Chair Transfer Assistive Devices: Bed rails;HOB elevated Bed/Chair Transfer: 4: Bed > Chair or W/C: Min A (steadying Pt. > 75%)  FIM - Locomotion: Wheelchair Distance: 150 Locomotion: Wheelchair: 1: Total Assistance/staff pushes wheelchair (Pt<25%) FIM - Locomotion: Ambulation Locomotion: Ambulation Assistive Devices: WEthelene HalAmbulation/Gait  Assistance: 1: +2 Total assist Locomotion: Ambulation: 1: Two helpers  Comprehension Comprehension Mode: Auditory Comprehension: 6-Follows complex conversation/direction: With extra time/assistive device  Expression Expression Mode: Verbal Expression: 6-Expresses complex ideas: With extra time/assistive device  Social Interaction Social Interaction: 6-Interacts appropriately with others with medication or extra time (anti-anxiety, antidepressant).  Problem Solving Problem Solving: 6-Solves complex problems: With extra time  Memory Memory: 6-Assistive device: No helper  Medical Problem List and Plan:  1. 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 Loop recorder implanted 06/07/13. On plavix 2. DVT Prophylaxis/Anticoagulation: SCDs. Monitor for any signs of DVT  3. Pain Management: Tylenol as needed for  4. Neuropsych: This patient is capable of making decisions on his own behalf.  5. Hypertension. Norvasc 10 mg daily, Coreg 12.5 mg twice a day,increase Catapres 0.3 mg tid, lisinopril 40 mg daily. Monitor with increased mobility, HCTZ added as well  6. Diabetes mellitus with peripheral neuropathy.CBGs generally good but elevation this am Hemoglobin A1c 6.2. DiaBeta 5 mg daily, Glucophage 1500 mg daily. Check blood sugars a.c. and at bedtime. Education by team  Dietary ed for DM and weight loss 7. Sinusitis identified on MRI. , monitor off abx 8.  Hypo K, likely HCTZ, add KCL LOS  (Days) 12 A FACE TO FACE EVALUATION WAS PERFORMED  KIRSTEINS,ANDREW E 06/19/2013, 7:26 AM

## 2013-06-20 ENCOUNTER — Inpatient Hospital Stay (HOSPITAL_COMMUNITY): Payer: Medicare Other | Admitting: Occupational Therapy

## 2013-06-20 ENCOUNTER — Inpatient Hospital Stay (HOSPITAL_COMMUNITY): Payer: Medicare Other | Admitting: Physical Therapy

## 2013-06-20 DIAGNOSIS — G8110 Spastic hemiplegia affecting unspecified side: Secondary | ICD-10-CM

## 2013-06-20 DIAGNOSIS — I6340 Cerebral infarction due to embolism of unspecified cerebral artery: Secondary | ICD-10-CM

## 2013-06-20 DIAGNOSIS — I69991 Dysphagia following unspecified cerebrovascular disease: Secondary | ICD-10-CM

## 2013-06-20 LAB — BASIC METABOLIC PANEL
BUN: 17 mg/dL (ref 6–23)
CO2: 25 mEq/L (ref 19–32)
Calcium: 8.9 mg/dL (ref 8.4–10.5)
Chloride: 102 mEq/L (ref 96–112)
Creatinine, Ser: 0.97 mg/dL (ref 0.50–1.35)
GFR calc Af Amer: >90 mL/min (ref >90)
GFR calc non Af Amer: 85 mL/min — ABNORMAL LOW (ref >90)
Glucose, Bld: 191 mg/dL — ABNORMAL HIGH (ref 70–99)
Potassium: 3.3 mEq/L — ABNORMAL LOW (ref 3.7–5.3)
Sodium: 141 mEq/L (ref 137–147)

## 2013-06-20 LAB — GLUCOSE, CAPILLARY
Glucose-Capillary: 127 mg/dL — ABNORMAL HIGH (ref 70–99)
Glucose-Capillary: 92 mg/dL (ref 70–99)
Glucose-Capillary: 96 mg/dL (ref 70–99)
Glucose-Capillary: 123 mg/dL — ABNORMAL HIGH (ref 70–99)

## 2013-06-20 MED ORDER — HYDROCHLOROTHIAZIDE 25 MG PO TABS
25.0000 mg | ORAL_TABLET | Freq: Every day | ORAL | Status: DC
  Administered 2013-06-20: 25 mg via ORAL
  Filled 2013-06-20 (×4): qty 1

## 2013-06-20 NOTE — Progress Notes (Signed)
Occupational Therapy Session Note  Patient Details  Name: Donald [name] MRN: [id number] Date of Birth: 12-Sep-1945  Today's Date: 06/20/2013 Time: 1916-6060 Time Calculation (min): 31 min  Skilled Therapeutic Interventions/Progress Updates:    Pt performed neuromuscular re-education on the LUE during the session.  Pt transferred to the mat stand pivot with max assist and transitioned to supine.  Utilized dowel rod and ace bandage over the left hand to help with grip.  Focused on shoulder flexion and extension with emphasis on maintaining elbow extension with all the movements.  Needs min to mod facilitation for maintaining elbow extension while performing the shoulder movements.  Pt with increased shoulder and hand movement today compared to last vision.  Noted slight digit extension also present in the index and middle finger.  Gross grasp present as well.    Therapy Documentation Precautions:  Precautions Precautions: Fall Precaution Comments: LUE and LLE hemiparesis Restrictions Weight Bearing Restrictions: No  Pain: Pain Assessment Pain Assessment: No/denies pain  See FIM for current functional status  Therapy/Group: Individual Therapy  Jaree Trinka OTR/L 06/20/2013, 4:02 PM

## 2013-06-20 NOTE — Progress Notes (Signed)
Physical Therapy Session Note  Patient Details  Name: Donald [name] MRN: [id number] Date of Birth: 1946-03-28  Today's Date: 06/20/2013 Time: 1108-1201 Time Calculation (min): 53 min  Short Term Goals: Week 2:  PT Short Term Goal 1 (Week 2): Pt will perform sit<>supine with min A PT Short Term Goal 2 (Week 2): Pt will perform sit<>stand with mod A PT Short Term Goal 3 (Week 2): Pt will propell w/c 150 feet with supervision PT Short Term Goal 4 (Week 2): Pt will ambulate 25 ft with max A +1  Skilled Therapeutic Interventions/Progress Updates:   Pt demonstrating improved grip and control in LUE by demonstrating ability to lock and unlock brake with LUE on L side.  Performed w/c mobility x 150' with RLE propulsion with supervision and improved safety.  Performed NMR standing at mat; see below for details.  Performed gait training with bilat UE support on RW with L hand orthosis to maintain grip; performed gait x 10' with +2 A for safety secondary to pt impulsivity with verbal and tactile cues for trunk control and decreased dependence on R shoulder elevation, cues for timing of advancing LLE, assistance for safe placement of LLE, cues for activation of LLE extension and forward weight shift and advancement of RLE.  Pt tolerated well.  Returned to room in w/c total A.  Therapy Documentation Precautions:  Precautions Precautions: Fall Precaution Comments: LUE and LLE hemiparesis Restrictions Weight Bearing Restrictions: No Pain: Pain Assessment Pain Assessment: No/denies pain Locomotion : Ambulation Ambulation/Gait Assistance: 1: +2 Total assist Wheelchair Mobility Distance: 150  Other Treatments: Treatments Neuromuscular Facilitation: Left;Upper Extremity;Lower Extremity;Activity to increase motor control;Activity to increase timing and sequencing;Activity to increase grading;Activity to increase sustained activation;Activity to increase lateral weight shifting;Activity to increase  anterior-posterior weight shifting;Right during sit <> stand from w/c with UE on tall mat with focus on sliding UE forwards on mat to facilitate hip strategy and forwards weight shift and activation of bilat LE for sit <> stand; performed standing with WB through UE on tall mat with focus on lateral weight shifting during RLE lateral stepping out and in with mod A overall on L side with verbal and tactile cues for anterior rotation of L trunk and pelvis to maintain LLE extension in WB.  See FIM for current functional status  Therapy/Group: Individual Therapy  Raylene [facility] 06/20/2013, 12:21 PM

## 2013-06-20 NOTE — Plan of Care (Signed)
Problem: Food- and Nutrition-Related Knowledge Deficit (NB-1.1) Goal: Nutrition education Formal process to instruct or train a patient/client in a skill or to impart knowledge to help patients/clients voluntarily manage or modify food choices and eating behavior to maintain or improve health. Outcome: Progressing Nutrition Education Note  RD consulted for nutrition education regarding a Heart Healthy diet.   Lipid Panel     Component Value Date/Time    CHOL 145 06/03/2013 0455    TRIG 163* 06/03/2013 0455    HDL 28* 06/03/2013 0455    CHOLHDL 5.2 06/03/2013 0455    VLDL 33 06/03/2013 0455    LDLCALC 84 06/03/2013 0455   Lab Results  Component Value Date    HGBA1C 6.2* 06/03/2013   RD provided "Heart Healthy Nutrition Therapy" handout from the [employer]. Reviewed patient's dietary recall. Provided examples on ways to decrease sodium and fat intake in diet. Discouraged intake of processed foods and use of salt shaker. Encouraged fresh fruits and vegetables as well as whole grain sources of carbohydrates to maximize fiber intake. Also discussed wt loss principles with pt.  Encouraged wt maintenance initially with ultimate plan for wt loss later this year.  Teach back method used.  Expect good compliance.  Body mass index is 33.56 kg/(m^2). Pt meets criteria for obese based on current BMI.  Current diet order is Regular, patient is consuming approximately 100% of meals at this time. Labs and medications reviewed. No further nutrition interventions warranted at this time. RD contact information provided. If additional nutrition issues arise, please re-consult RD.  Brynda Greathouse, MS RD LDN Clinical Inpatient Dietitian Pager: [phone] Weekend/After hours pager: [phone]

## 2013-06-20 NOTE — Progress Notes (Signed)
Occupational Therapy Session Note  Patient Details  Name: Donald [name] MRN: [id number] Date of Birth: December 30, 1945  Today's Date: 06/20/2013 Time: 8588-5027 and 1300-1345 Time Calculation (min): 55 min and 45 min  Short Term Goals: Week 2:  OT Short Term Goal 1 (Week 2): Pt will complete toilet transfer squat pivot with max assist of one caregiver OT Short Term Goal 2 (Week 2): Pt will complete transfer to tub bench with max assist of one caregiver OT Short Term Goal 3 (Week 2): Pt will complete UB dressing wth supervision and less than 10% cues OT Short Term Goal 4 (Week 2): Pt will complet LB dressing with mod assist of one caregiver OT Short Term Goal 5 (Week 2): Pt will wash Rt arm with LUE with min assist  Skilled Therapeutic Interventions/Progress Updates:    1) Pt seen for ADL retraining with focus on sit <> stand, transfers, and functional use of LUE during self-care tasks of bathing and dressing. Pt in bed upon arrival, performed squat/scoot pivot to w/c with manual facilitation at LLE to promote WB position to engage LLE in transfer. Pt completed bathing at sink in seated position except standing to wash buttocks, pt required min assist for stability in standing while washing and pulling up pants. Pt continues to demonstrate decreased safety awareness and impulsivity with mobility. Issued shoe buttons to assist with fastening shoes with pt return demonstrating understanding.  Focus on increased use of LUE with stabilizing deodorant and washing Rt arm with focus on motor control and gross grasp that he is developing.    2) Pt seen for 1:1 OT with focus on NM re-ed in sitting and standing.  Pt propelled w/c to therapy gym with increased time and cues for Lt attention.  Pt attempted propelling w/c with LUE on tire with no awareness, educated on positioning LUE prior to any mobility.  Engaged in sit > stand x4 with focus on midline standing balance, pt continues to have Rt bias in  standing.  Engaged in weight shifting in standing while completing table top task with WB through LUE on high-low table.  Post WB engaged in reaching with LUE for 2" diameter item to simulate reaching for grooming items with focus on shoulder flexion in straight line without overcompensating with horizontal adduction.  Pt with difficulty combining shoulder flexion with modified 3 jaw chuck grasp.  Therapy Documentation Precautions:  Precautions Precautions: Fall Precaution Comments: LUE and LLE hemiparesis Restrictions Weight Bearing Restrictions: No Pain:  Pt with no c/o pain  See FIM for current functional status  Therapy/Group: Individual Therapy  Simonne Come 06/20/2013, 12:08 PM

## 2013-06-20 NOTE — Progress Notes (Signed)
68 y.o. right-handed male with history of hypertension as well as diabetes mellitus. Patient is from [place] and was visiting family in [place]. Admitted 06/03/2013 of left-sided weakness and slurred speech. 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 Echocardiogram with ejection fraction 76% grade 1 diastolic dysfunction. CT angiogram head and neck with no proximal branch occlusion or stenosis. Patient did receive TPA. Neurology services consulted placed on Plavix for CVA prophylaxis. TEE showed no PFO or thrombus loop recorder just implanted to rule out arrhythmia as potential cause. Acute on chronic paranasal sinusitis identified on MRI of the brain placed on amoxicillin  Subjective/Complaints: Discussed lifestyle changes for secondary stroke prevention.  Dietary consult ordered Review of Systems - Negative except weakness on Left and as above  Objective: Vital Signs: Blood pressure 167/84, pulse 50, temperature 97.6 F (36.4 C), temperature source Oral, resp. rate 19, height 6\' 3"  (1.905 m), weight 121.791 kg (268 lb 8 oz), SpO2 96.00%. No results found. Results for orders placed during the hospital encounter of 06/07/13 (from the past 72 hour(s))  GLUCOSE, CAPILLARY     Status: Abnormal   Collection Time    06/17/13  7:20 AM      Result Value Range   Glucose-Capillary 144 (*) 70 - 99 mg/dL   Comment 1 Notify RN    GLUCOSE, CAPILLARY     Status: None   Collection Time    06/17/13 12:02 PM      Result Value Range   Glucose-Capillary 98  70 - 99 mg/dL   Comment 1 Notify RN    GLUCOSE, CAPILLARY     Status: None   Collection Time    06/17/13  4:41 PM      Result Value Range   Glucose-Capillary 70  70 - 99 mg/dL  GLUCOSE, CAPILLARY     Status: Abnormal   Collection Time    06/17/13  9:22 PM      Result Value Range   Glucose-Capillary 138  (*) 70 - 99 mg/dL  GLUCOSE, CAPILLARY     Status: Abnormal   Collection Time    06/18/13  7:41 AM      Result Value Range   Glucose-Capillary 147 (*) 70 - 99 mg/dL  GLUCOSE, CAPILLARY     Status: None   Collection Time    06/18/13 12:04 PM      Result Value Range   Glucose-Capillary 93  70 - 99 mg/dL  GLUCOSE, CAPILLARY     Status: None   Collection Time    06/18/13  5:13 PM      Result Value Range   Glucose-Capillary 78  70 - 99 mg/dL  GLUCOSE, CAPILLARY     Status: None   Collection Time    06/18/13  8:48 PM      Result Value Range   Glucose-Capillary 75  70 - 99 mg/dL  GLUCOSE, CAPILLARY     Status: Abnormal   Collection Time    06/19/13  7:35 AM      Result Value Range   Glucose-Capillary 123 (*) 70 - 99 mg/dL  GLUCOSE, CAPILLARY     Status: Abnormal   Collection Time    06/19/13 11:24 AM      Result Value Range   Glucose-Capillary 134 (*) 70 - 99 mg/dL  GLUCOSE, CAPILLARY     Status: Abnormal   Collection Time  06/19/13  4:57 PM      Result Value Range   Glucose-Capillary 112 (*) 70 - 99 mg/dL  GLUCOSE, CAPILLARY     Status: Abnormal   Collection Time    06/19/13  9:01 PM      Result Value Range   Glucose-Capillary 156 (*) 70 - 99 mg/dL   Comment 1 Notify RN       HEENT: normal Cardio: RRR and no murmur Resp: CTA B/L and unlabored GI: BS positive and non distended Extremity:  Pulses positive and No Edema Skin:   Intact except R med hand abrasion, no drainage, left elbow normal, loop recorder incision healing well Neuro: Alert/Oriented, Cranial Nerve Abnormalities left central 7, Abnormal Sensory absent left hand and foot, Abnormal Motor 3- Left shoulder abd, add, Bi, Tri and Thumb ext,0/5 finger ext 3- L Hip Add, 3-/5 hip flexion, 3-/5 KE, trace toe flexion, Dysarthric and Other poor lip closure on Left, very poor motor control on Left Musc/Skel:  Other Left shoulder sublux Gen NAD   Assessment/Plan: 1. Functional deficits secondary to R MCA embolic  infarct  which require 3+ hours per day of interdisciplinary therapy in a comprehensive inpatient rehab setting. Physiatrist is providing close team supervision and 24 hour management of active medical problems listed below. Physiatrist and rehab team continue to assess barriers to discharge/monitor patient progress toward functional and medical goals.  FIM: FIM - Bathing Bathing Steps Patient Completed: Chest;Left Arm;Abdomen;Right upper leg;Left upper leg;Front perineal area;Buttocks Bathing: 3: Mod-Patient completes 5-7 109f 10 parts or 50-74%  FIM - Upper Body Dressing/Undressing Upper body dressing/undressing steps patient completed: Thread/unthread left sleeve of pullover shirt/dress;Put head through opening of pull over shirt/dress;Pull shirt over trunk Upper body dressing/undressing: 4: Min-Patient completed 75 plus % of tasks FIM - Lower Body Dressing/Undressing Lower body dressing/undressing steps patient completed: Thread/unthread left underwear leg;Thread/unthread left pants leg;Thread/unthread right pants leg Lower body dressing/undressing: 2: Max-Patient completed 25-49% of tasks  FIM - Toileting Toileting steps completed by patient: Performs perineal hygiene Toileting: 1: Two helpers  FIM - Radio producer Devices: Elevated toilet seat;Grab bars Toilet Transfers: 3-To toilet/BSC: Mod A (lift or lower assist);3-From toilet/BSC: Mod A (lift or lower assist)  FIM - Bed/Chair Transfer Bed/Chair Transfer Assistive Devices: Bed rails;HOB elevated Bed/Chair Transfer: 4: Bed > Chair or W/C: Min A (steadying Pt. > 75%)  FIM - Locomotion: Wheelchair Distance: 150 Locomotion: Wheelchair: 1: Total Assistance/staff pushes wheelchair (Pt<25%) FIM - Locomotion: Ambulation Locomotion: Ambulation Assistive Devices: Ethelene Hal Ambulation/Gait Assistance: 1: +2 Total assist Locomotion: Ambulation: 1: Two helpers  Comprehension Comprehension Mode:  Auditory Comprehension: 6-Follows complex conversation/direction: With extra time/assistive device  Expression Expression Mode: Verbal Expression: 6-Expresses complex ideas: With extra time/assistive device  Social Interaction Social Interaction: 6-Interacts appropriately with others with medication or extra time (anti-anxiety, antidepressant).  Problem Solving Problem Solving: 6-Solves complex problems: With extra time  Memory Memory: 6-Assistive device: No helper  Medical Problem List and Plan:  1. 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 Loop recorder implanted 06/07/13. On plavix 2. DVT Prophylaxis/Anticoagulation: SCDs. Monitor for any signs of DVT  3. Pain Management: Tylenol as needed for  4. Neuropsych: This patient is capable of making decisions on his own behalf.  5. Hypertension. Norvasc 10 mg daily, Coreg 12.5 mg twice a day,increase Catapres 0.3 mg tid, lisinopril 40 mg daily. Monitor with increased mobility, HCTZ added as well  6. Diabetes mellitus with peripheral neuropathy.CBGs generally good but elevation this am  Hemoglobin A1c 6.2. DiaBeta 5 mg daily, Glucophage 1500 mg daily. Check blood sugars a.c. and at bedtime. Education by team  Dietary ed for DM and weight loss 7. Sinusitis identified on MRI. , monitor off abx 8.  Hypo K, likely HCTZ, add KCL LOS (Days) 13 A FACE TO FACE EVALUATION WAS PERFORMED  Donald [name] 06/20/2013, 6:24 AM

## 2013-06-21 ENCOUNTER — Inpatient Hospital Stay (HOSPITAL_COMMUNITY): Payer: Medicare Other

## 2013-06-21 ENCOUNTER — Inpatient Hospital Stay (HOSPITAL_COMMUNITY): Payer: Medicare Other | Admitting: Occupational Therapy

## 2013-06-21 ENCOUNTER — Inpatient Hospital Stay (HOSPITAL_COMMUNITY): Payer: Medicare Other | Admitting: Physical Therapy

## 2013-06-21 LAB — GLUCOSE, CAPILLARY
Glucose-Capillary: 154 mg/dL — ABNORMAL HIGH (ref 70–99)
Glucose-Capillary: 70 mg/dL (ref 70–99)
Glucose-Capillary: 103 mg/dL — ABNORMAL HIGH (ref 70–99)
Glucose-Capillary: 87 mg/dL (ref 70–99)
Glucose-Capillary: 162 mg/dL — ABNORMAL HIGH (ref 70–99)

## 2013-06-21 MED ORDER — POTASSIUM CHLORIDE CRYS ER 20 MEQ PO TBCR
20.0000 meq | EXTENDED_RELEASE_TABLET | Freq: Every day | ORAL | Status: DC
  Administered 2013-06-21 – 2013-07-01 (×11): 20 meq via ORAL
  Filled 2013-06-21 (×12): qty 1

## 2013-06-21 MED ORDER — LIVING WELL WITH DIABETES BOOK
Freq: Once | Status: AC
  Administered 2013-06-22: 18:00:00
  Filled 2013-06-21 (×2): qty 1

## 2013-06-21 MED ORDER — DOXAZOSIN MESYLATE 1 MG PO TABS
1.0000 mg | ORAL_TABLET | Freq: Every day | ORAL | Status: DC
  Administered 2013-06-21: 1 mg via ORAL
  Filled 2013-06-21 (×2): qty 1

## 2013-06-21 NOTE — Progress Notes (Addendum)
Referral received for 'reinforce education'.  Bedside education is to be done by the bedside RN's, using the ed'l video network, the Living Well with Diabetes book from pharmacy, and RD consult.    Will check on patient if possible before discharge for urgent needs. Thank you, Rosita Kea, RN, CNS, Diabetes Coordinator [phone])

## 2013-06-21 NOTE — Progress Notes (Signed)
Occupational Therapy Session Note  Patient Details  Name: Donald [name] MRN: [id number] Date of Birth: 03/07/1946  Today's Date: 06/21/2013 Time: 0932-1030 and 1345-1430 Time Calculation (min): 58 min and 45 min  Short Term Goals: Week 2:  OT Short Term Goal 1 (Week 2): Pt will complete toilet transfer squat pivot with max assist of one caregiver OT Short Term Goal 2 (Week 2): Pt will complete transfer to tub bench with max assist of one caregiver OT Short Term Goal 3 (Week 2): Pt will complete UB dressing wth supervision and less than 10% cues OT Short Term Goal 4 (Week 2): Pt will complet LB dressing with mod assist of one caregiver OT Short Term Goal 5 (Week 2): Pt will wash Rt arm with LUE with min assist  Skilled Therapeutic Interventions/Progress Updates:    1) Pt seen for ADL retraining with focus on sit <> stand and functional use of LUE during self-care tasks of bathing and dressing. Pt in w/c upon arrival, demonstrated increased grasp and unlocked Lt brake with Lt hand. Pt completed bathing at sink in seated position as reports he completed LB bathing and dressing post toileting early this AM. Pt continues to demonstrate decreased safety awareness and impulsivity with mobility and decreased awareness of LUE positioning as often dangling in w/c wheel. Pt demonstrated increased [place] and coordination with LUE with washing Rt arm and obtaining deodorant from counter.  Educated pt on [facility] exercises and provided handout with focus on finger flexion/extension, adduction/abduction, and thumb opposition.  Pt return demonstrated each exercise with min cues for appropriate technique.  2) Pt seen for 1:1 OT with focus on NM re-ed with standing and weight bearing through LUE in standing.  Pt required tactile cues at Lt knee to promote knee control and manual facilitation at hips to promote upright trunk control.  Pt verbally able to describe proper Lt foot placement but unable to perform without  additional cues and setup.  Pt reports feeling "different" and blood sugar had been a little low before lunch.  RN checked blood sugar: 103 and BP: 135/74 (both of which somewhat low for pt). Pt drank 2 cups of 12 oz of water throughout session with pt reporting feeling better. Engaged in standing task again with further focus on weight shifting to obtain midline standing and WB through LUE.  Pt with improved shoulder flexion post WB.  Therapy Documentation Precautions:  Precautions Precautions: Fall Precaution Comments: LUE and LLE hemiparesis Restrictions Weight Bearing Restrictions: No General:   Vital Signs: Therapy Vitals Pulse Rate: 62 BP: 168/90 mmHg Pain: Pain Assessment Pain Assessment: 0-10 Pain Score: 4  Pain Type: Acute pain Pain Location: Arm Pain Orientation: Left Pain Descriptors / Indicators: Aching Pain Frequency: Occasional Pain Intervention(s): Medication (See eMAR) (tylenol 650 mg po)  See FIM for current functional status  Therapy/Group: Individual Therapy  Simonne Come 06/21/2013, 11:10 AM

## 2013-06-21 NOTE — Progress Notes (Signed)
Physical Therapy Note  Patient Details  Name: Donald [name] MRN: [id number] Date of Birth: 02-24-1946 Today's Date: 06/21/2013  Time: 1300-1326 26 minutes  1:1 no c/o pain.  Scoot transfers with min A, cues for L LE placement.  Standing NMR with focus on wt shifting L and L knee control with manual facilitation at hips for mini squats, squats with wt shift, R LE stepping all directions. Pt requires min-mod A for balance and L knee control.  W/c parts management with L UE with supervision, improved grip and L UE mobility.   [name] 06/21/2013, 1:26 PM

## 2013-06-21 NOTE — Progress Notes (Signed)
Physical Therapy Session Note  Patient Details  Name: Donald [name] MRN: [id number] Date of Birth: [birth year]  Today's Date: 06/21/2013 Time: 1100-1200 Time Calculation (min): 60 min  Short Term Goals: Week 2:  PT Short Term Goal 1 (Week 2): Pt will perform sit<>supine with min A PT Short Term Goal 2 (Week 2): Pt will perform sit<>stand with mod A PT Short Term Goal 3 (Week 2): Pt will propell w/c 150 feet with supervision PT Short Term Goal 4 (Week 2): Pt will ambulate 25 ft with max A +1  Skilled Therapeutic Interventions/Progress Updates:  Patient sitting in wheelchair upon entering room. Patient propelled wheelchair 180 feet to gym with supervision using hemi technique. Patient sit to stand and ambulated with rolling walker, left hand orthosis, and left toe off brace 75 feet x 1 and 45 feet x 1 with +2 assist for safety and control. Patient able to progress left LE forward tending to have foot rotated out. He required assist for directional placement about 30% of the time. Patient cued to stand tall and able to maintain left knee control with only occasional knee hyperextension in stance. Patient needed occasional cueing to relax right shoulder and some manual facilitation for trunk during gait. Patient transferred wheelchair to mat with min steady assist including wheelchair set up. Patient supine <> sit with supervision. Patient worked on left LE control exercises in supine including bridging with a ball between knees, hip flexion with adduction, and stepping up and over right LE. Patient propelled wheelchair back to room and was left in wheelchair with call bell in reach.  Therapy Documentation Precautions:  Precautions Precautions: Fall Precaution Comments: LUE and LLE hemiparesis Restrictions Weight Bearing Restrictions: No Pain: Pain Assessment Pain Assessment: No/denies pain Pain Score: 0-No pain Patient reported some muscle soreness in left triceps Locomotion  : Ambulation Ambulation/Gait Assistance: 1: +2 Total assist Wheelchair Mobility Distance: 180   See FIM for current functional status  Therapy/Group: Individual Therapy  [name] 06/21/2013, 12:22 PM

## 2013-06-21 NOTE — Progress Notes (Signed)
68 y.o. right-handed male with history of hypertension as well as diabetes mellitus. Patient is from [place] and was visiting family in [address]. Admitted 06/03/2013 of left-sided weakness and slurred speech. 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 Echocardiogram with ejection fraction 16% grade 1 diastolic dysfunction. CT angiogram head and neck with no proximal branch occlusion or stenosis. Patient did receive TPA. Neurology services consulted placed on Plavix for CVA prophylaxis. TEE showed no PFO or thrombus loop recorder just implanted to rule out arrhythmia as potential cause. 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 rate 18, height 6' 3"  (1.905 m), weight 121.791 kg (268 lb 8 oz), SpO2 98.00%. No results found. Results for orders placed during the hospital encounter of 06/07/13 (from the past 72 hour(s))  GLUCOSE, CAPILLARY     Status: Abnormal   Collection Time    06/18/13  7:41 AM      Result Value Range   Glucose-Capillary 147 (*) 70 - 99 mg/dL  GLUCOSE, CAPILLARY     Status: None   Collection Time    06/18/13 12:04 PM      Result Value Range   Glucose-Capillary 93  70 - 99 mg/dL  GLUCOSE, CAPILLARY     Status: None   Collection Time    06/18/13  5:13 PM      Result Value Range   Glucose-Capillary 78  70 - 99 mg/dL  GLUCOSE, CAPILLARY     Status: None   Collection Time    06/18/13  8:48 PM      Result Value Range   Glucose-Capillary 75  70 - 99 mg/dL  GLUCOSE, CAPILLARY     Status: Abnormal   Collection Time    06/19/13  7:35 AM       Result Value Range   Glucose-Capillary 123 (*) 70 - 99 mg/dL  GLUCOSE, CAPILLARY     Status: Abnormal   Collection Time    06/19/13 11:24 AM      Result Value Range   Glucose-Capillary 134 (*) 70 - 99 mg/dL  GLUCOSE, CAPILLARY     Status: Abnormal   Collection Time    06/19/13  4:57 PM      Result Value Range   Glucose-Capillary 112 (*) 70 - 99 mg/dL  GLUCOSE, CAPILLARY     Status: Abnormal   Collection Time    06/19/13  9:01 PM      Result Value Range   Glucose-Capillary 156 (*) 70 - 99 mg/dL   Comment 1 Notify RN    GLUCOSE, CAPILLARY     Status: Abnormal   Collection Time    06/20/13  7:34 AM      Result Value Range   Glucose-Capillary 127 (*) 70 - 99 mg/dL  BASIC METABOLIC PANEL     Status: Abnormal   Collection Time    06/20/13  8:30 AM      Result Value Range   Sodium 141  137 - 147 mEq/L   Potassium 3.3 (*) 3.7 - 5.3 mEq/L   Chloride 102  96 - 112 mEq/L   CO2 25  19 - 32  mEq/L   Glucose, Bld 191 (*) 70 - 99 mg/dL   BUN 17  6 - 23 mg/dL   Creatinine, Ser 0.97  0.50 - 1.35 mg/dL   Calcium 8.9  8.4 - 10.5 mg/dL   GFR calc non Af Amer 85 (*) >90 mL/min   GFR calc Af Amer >90  >90 mL/min   Comment: (NOTE)     The eGFR has been calculated using the CKD EPI equation.     This calculation has not been validated in all clinical situations.     eGFR's persistently <90 mL/min signify possible Chronic Kidney     Disease.  GLUCOSE, CAPILLARY     Status: None   Collection Time    06/20/13 12:28 PM      Result Value Range   Glucose-Capillary 92  70 - 99 mg/dL  GLUCOSE, CAPILLARY     Status: None   Collection Time    06/20/13  5:17 PM      Result Value Range   Glucose-Capillary 96  70 - 99 mg/dL  GLUCOSE, CAPILLARY     Status: Abnormal   Collection Time    06/20/13  8:52 PM      Result Value Range   Glucose-Capillary 123 (*) 70 - 99 mg/dL   Comment 1 Notify RN       HEENT: normal Cardio: RRR and no murmur Resp: CTA B/L and unlabored GI: BS positive and non  distended Extremity:  Pulses positive and No Edema Skin:   Intact except R med hand abrasion, no drainage, left elbow normal, loop recorder incision healing well Neuro: Alert/Oriented, Cranial Nerve Abnormalities left central 7, Abnormal Sensory absent left hand and foot, Abnormal Motor 3- Left shoulder abd, add, Bi, Tri and Thumb ext,0/5 finger ext 3- L Hip Add, 3-/5 hip flexion, 3-/5 KE, trace toe flexion, Dysarthric and Other poor lip closure on Left, very poor motor control on Left Musc/Skel:  Other Left shoulder sublux Gen NAD   Assessment/Plan: 1. Functional deficits secondary to R MCA embolic infarct  which require 3+ hours per day of interdisciplinary therapy in a comprehensive inpatient rehab setting. Physiatrist is providing close team supervision and 24 hour management of active medical problems listed below. Physiatrist and rehab team continue to assess barriers to discharge/monitor patient progress toward functional and medical goals.  FIM: FIM - Bathing Bathing Steps Patient Completed: Chest;Left Arm;Abdomen;Right upper leg;Left upper leg;Front perineal area;Buttocks;Right lower leg (including foot);Left lower leg (including foot) Bathing: 4: Min-Patient completes 8-9 [id number] parts or 75+ percent  FIM - Upper Body Dressing/Undressing Upper body dressing/undressing steps patient completed: Thread/unthread left sleeve of pullover shirt/dress;Put head through opening of pull over shirt/dress;Pull shirt over trunk;Thread/unthread right sleeve of pullover shirt/dresss Upper body dressing/undressing: 5: Supervision: Safety issues/verbal cues FIM - Lower Body Dressing/Undressing Lower body dressing/undressing steps patient completed: Thread/unthread right underwear leg;Thread/unthread left underwear leg;Thread/unthread right pants leg;Thread/unthread left pants leg;Don/Doff right shoe;Don/Doff left shoe;Fasten/unfasten right shoe;Fasten/unfasten left shoe Lower body dressing/undressing:  3: Mod-Patient completed 50-74% of tasks  FIM - Toileting Toileting steps completed by patient: Performs perineal hygiene Toileting: 1: Two helpers  FIM - TRadio producerDevices: Elevated toilet seat;Grab bars Toilet Transfers: 3-To toilet/BSC: Mod A (lift or lower assist);3-From toilet/BSC: Mod A (lift or lower assist)  FIM - Bed/Chair Transfer Bed/Chair Transfer Assistive Devices: Bed rails;HOB elevated Bed/Chair Transfer: 2: Chair or W/C > Bed: Max A (lift and lower assist);2: Bed > Chair or W/C: Max A (lift and  lower assist)  FIM - Locomotion: Wheelchair Distance: 150 Locomotion: Wheelchair: 5: Travels 150 ft or more: maneuvers on rugs and over door sills with supervision, cueing or coaxing FIM - Locomotion: Ambulation Locomotion: Ambulation Assistive Devices: Walker - Rolling;Orthosis Ambulation/Gait Assistance: 1: +2 Total assist Locomotion: Ambulation: 1: Two helpers  Comprehension Comprehension Mode: Auditory Comprehension: 6-Follows complex conversation/direction: With extra time/assistive device  Expression Expression Mode: Verbal Expression: 6-Expresses complex ideas: With extra time/assistive device  Social Interaction Social Interaction: 6-Interacts appropriately with others with medication or extra time (anti-anxiety, antidepressant).  Problem Solving Problem Solving: 6-Solves complex problems: With extra time  Memory Memory: 6-Assistive device: No helper  Medical Problem List and Plan:  1. 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 Loop recorder implanted 06/07/13. On plavix 2. DVT Prophylaxis/Anticoagulation: SCDs. Monitor for any signs of DVT  3. Pain Management: Tylenol as needed for  4. Neuropsych: This patient is capable of making decisions on his own behalf.  5. Hypertension. Norvasc 10 mg daily, Coreg 12.5 mg twice a day,increase Catapres 0.3 mg tid, lisinopril 40 mg daily. Monitor with increased mobility, HCTZ added as well  6.  Diabetes mellitus with peripheral neuropathy.CBGs generally good but elevation this am Hemoglobin A1c 6.2. DiaBeta 5 mg daily, Glucophage 1500 mg daily. Check blood sugars a.c. and at bedtime. Education by team  Dietary ed for DM and weight loss 7. Sinusitis identified on MRI. , monitor off abx 8.  Hypo K, likely HCTZ, increaseKCL LOS (Days) 14 A FACE TO FACE EVALUATION WAS PERFORMED  KIRSTEINS,ANDREW E 06/21/2013, 6:09 AM

## 2013-06-22 ENCOUNTER — Inpatient Hospital Stay (HOSPITAL_COMMUNITY): Payer: Medicare Other | Admitting: Physical Therapy

## 2013-06-22 ENCOUNTER — Inpatient Hospital Stay (HOSPITAL_COMMUNITY): Payer: Medicare Other | Admitting: Occupational Therapy

## 2013-06-22 ENCOUNTER — Inpatient Hospital Stay (HOSPITAL_COMMUNITY): Payer: Medicare Other

## 2013-06-22 LAB — GLUCOSE, CAPILLARY
Glucose-Capillary: 156 mg/dL — ABNORMAL HIGH (ref 70–99)
Glucose-Capillary: 105 mg/dL — ABNORMAL HIGH (ref 70–99)
Glucose-Capillary: 95 mg/dL (ref 70–99)

## 2013-06-22 MED ORDER — DOXAZOSIN MESYLATE 2 MG PO TABS
2.0000 mg | ORAL_TABLET | Freq: Every day | ORAL | Status: DC
  Administered 2013-06-22 – 2013-06-24 (×3): 2 mg via ORAL
  Filled 2013-06-22 (×5): qty 1

## 2013-06-22 NOTE — Progress Notes (Signed)
Social Work Patient ID: Donald [name], male   DOB: Jan 22, 1946, 68 y.o.   MRN: [id number] Met with pt and wife to inform of progression toward goals and discharge 1/29.  Wife was here to attend therapies and get measurements for daughter's home. Need to see if wheelchair accessible or if need to look for another place to go at discharge.  Aware pt is ambulating now and are very pleased with this.  Both feel it will take Time and are willing to be patient.  Discussed DME and follow up therapies and will await team's input.  Work on discharge with both.

## 2013-06-22 NOTE — Progress Notes (Signed)
Occupational Therapy Weekly Progress Note  Patient Details  Name: Donald [name] MRN: [id number] Date of Birth: March 30, 1946  Today's Date: 06/22/2013 Time: 1015-1110 and 3291-9166 Time Calculation (min): 55 min and 30 min  Patient has met 5 of 5 short term goals.  Pt making steady progress towards goals.  Pt currently min-mod assist with squat pivot transfers, however continues to require mod-max verbal cues for sequencing and safety due to decreased Lt sensation, proprioception and impulsivity.  Pt is developing LUE shoulder flexion/extension to approx 80 degrees and horizontal abduction/adduction to participate in self-care tasks.  Gross grasp is developing with pt demonstrating increased movement in 1st and 2nd digits.    Patient continues to demonstrate the following deficits: Lt hemiparesis, decreased trunk trunk control, impaired static and dynamic standing balance, motor control, motor planning, impaired safety awareness, Lt inattention and therefore will continue to benefit from skilled OT intervention to enhance overall performance with BADL and Reduce care partner burden.  Patient progressing toward long term goals..  Continue plan of care.  OT Short Term Goals Week 2:  OT Short Term Goal 1 (Week 2): Pt will complete toilet transfer squat pivot with max assist of one caregiver OT Short Term Goal 1 - Progress (Week 2): Met OT Short Term Goal 2 (Week 2): Pt will complete transfer to tub bench with max assist of one caregiver OT Short Term Goal 2 - Progress (Week 2): Met OT Short Term Goal 3 (Week 2): Pt will complete UB dressing wth supervision and less than 10% cues OT Short Term Goal 3 - Progress (Week 2): Met OT Short Term Goal 4 (Week 2): Pt will complet LB dressing with mod assist of one caregiver OT Short Term Goal 4 - Progress (Week 2): Met OT Short Term Goal 5 (Week 2): Pt will wash Rt arm with LUE with min assist OT Short Term Goal 5 - Progress (Week 2): Met Week 3:  OT  Short Term Goal 1 (Week 3): Pt will complete toilet transfer with min assist 5/5 opportunities OT Short Term Goal 2 (Week 3): Pt will complete transfer to tub bench with min assist of one caregiver with fewer than 25% cues for safety and sequencing OT Short Term Goal 3 (Week 3): Pt will complete toileting (hygiene and clothing management) with min assist OT Short Term Goal 4 (Week 3): Pt will complete LB dressing with min assist OT Short Term Goal 5 (Week 3): Pt will utilize LUE as gross assist to complete grooming tasks.  Skilled Therapeutic Interventions/Progress Updates:    1) Pt seen for ADL retraining with focus on sit <> stand and functional use of LUE during self-care tasks of bathing and dressing. Pt in w/c upon arrival, demonstrated increased grasp and unlocked Lt brake with Lt hand. Pt completed bathing at sink in seated position as reports he completed LB bathing and dressing post toileting early this AM. Pt continues to demonstrate decreased safety awareness and impulsivity with mobility and decreased awareness of LUE positioning as often dangling in w/c wheel. Pt demonstrated increased [place] and coordination with LUE with washing Rt arm and obtaining deodorant from counter. Pt completed [facility] exercises with focus on finger flexion/extension, adduction/abduction, and thumb opposition. Pt with concerns regarding d/c planning.  2) Pt seen for 1:1 OT with focus on LUE ROM with UE [place] with providing pt targets to reach for to increase motor control with shoulder flexion and abduction.  Provided minimal resistance with shoulder flexion with pt demonstrating increased shoulder  movement.  Following ROM engaged in [facility] with picking up 2" items with focus on pincer and 3 jaw chuck grasp then placing in bucket to Rt.  Pt required tactile cues at elbow to promote motor control when crossing midline.  Pt's wife present asking questions about equipment upon d/c, suggesting drop arm BSC and tub bench if  bathroom w/c accessible.  She reports plan to measure doorways.  Therapy Documentation Precautions:  Precautions Precautions: Fall Precaution Comments: LUE and LLE hemiparesis Restrictions Weight Bearing Restrictions: No Pain: Pain Assessment Pain Assessment: No/denies pain Pain Score: 0-No painPt with no c/o pain  See FIM for current functional status  Therapy/Group: Individual Therapy  Simonne Come 06/22/2013, 11:15 AM

## 2013-06-22 NOTE — Progress Notes (Addendum)
68 y.o. right-handed male with history of hypertension as well as diabetes mellitus. Patient is from [place] and was visiting family in [place]. Admitted 06/03/2013 of left-sided weakness and slurred speech. 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 Echocardiogram with ejection fraction 34% grade 1 diastolic dysfunction. CT angiogram head and neck with no proximal branch occlusion or stenosis. Patient did receive TPA. Neurology services consulted placed on Plavix for CVA prophylaxis. TEE showed no PFO or thrombus loop recorder just implanted to rule out arrhythmia as potential cause. Acute on chronic paranasal sinusitis identified on MRI of the brain placed on amoxicillin  Subjective/Complaints: Pt without issues except tired after therapy, discussed am systolic BP issues Review of Systems - Negative except weakness on Left and as above  Objective: Vital Signs: Blood pressure 177/81, pulse 60, temperature 97.8 F (36.6 C), temperature source Oral, resp. rate 16, height $RemoveBe'6\' 3"'[id number]$  (1.905 m), weight 121.791 kg (268 lb 8 oz), SpO2 97.00%. No results found. Results for orders placed during the hospital encounter of 06/07/13 (from the past 72 hour(s))  GLUCOSE, CAPILLARY     Status: Abnormal   Collection Time    06/19/13  7:35 AM      Result Value Range   Glucose-Capillary 123 (*) 70 - 99 mg/dL  GLUCOSE, CAPILLARY     Status: Abnormal   Collection Time    06/19/13 11:24 AM      Result Value Range   Glucose-Capillary 134 (*) 70 - 99 mg/dL  GLUCOSE, CAPILLARY     Status: Abnormal   Collection Time    06/19/13  4:57 PM      Result Value Range   Glucose-Capillary 112 (*) 70 - 99 mg/dL  GLUCOSE, CAPILLARY     Status: Abnormal   Collection Time    06/19/13  9:01 PM      Result Value Range   Glucose-Capillary 156 (*) 70 - 99 mg/dL   Comment 1 Notify RN     GLUCOSE, CAPILLARY     Status: Abnormal   Collection Time    06/20/13  7:34 AM      Result Value Range   Glucose-Capillary 127 (*) 70 - 99 mg/dL  BASIC METABOLIC PANEL     Status: Abnormal   Collection Time    06/20/13  8:30 AM      Result Value Range   Sodium 141  137 - 147 mEq/L   Potassium 3.3 (*) 3.7 - 5.3 mEq/L   Chloride 102  96 - 112 mEq/L   CO2 25  19 - 32 mEq/L   Glucose, Bld 191 (*) 70 - 99 mg/dL   BUN 17  6 - 23 mg/dL   Creatinine, Ser 0.97  0.50 - 1.35 mg/dL   Calcium 8.9  8.4 - 10.5 mg/dL   GFR calc non Af Amer 85 (*) >90 mL/min   GFR calc Af Amer >90  >90 mL/min   Comment: (NOTE)     The eGFR has been calculated using the CKD EPI equation.     This calculation has not been validated in all clinical situations.     eGFR's persistently <90 mL/min signify possible Chronic Kidney     Disease.  GLUCOSE, CAPILLARY     Status: None   Collection Time    06/20/13 12:28 PM      Result Value Range   Glucose-Capillary 92  70 -  99 mg/dL  GLUCOSE, CAPILLARY     Status: None   Collection Time    06/20/13  5:17 PM      Result Value Range   Glucose-Capillary 96  70 - 99 mg/dL  GLUCOSE, CAPILLARY     Status: Abnormal   Collection Time    06/20/13  8:52 PM      Result Value Range   Glucose-Capillary 123 (*) 70 - 99 mg/dL   Comment 1 Notify RN    GLUCOSE, CAPILLARY     Status: Abnormal   Collection Time    06/21/13  7:05 AM      Result Value Range   Glucose-Capillary 154 (*) 70 - 99 mg/dL   Comment 1 Notify RN    GLUCOSE, CAPILLARY     Status: None   Collection Time    06/21/13 12:05 PM      Result Value Range   Glucose-Capillary 70  70 - 99 mg/dL  GLUCOSE, CAPILLARY     Status: Abnormal   Collection Time    06/21/13  2:08 PM      Result Value Range   Glucose-Capillary 103 (*) 70 - 99 mg/dL  GLUCOSE, CAPILLARY     Status: None   Collection Time    06/21/13  4:51 PM      Result Value Range   Glucose-Capillary 87  70 - 99 mg/dL   Comment 1 Notify RN     GLUCOSE, CAPILLARY     Status: Abnormal   Collection Time    06/21/13  8:49 PM      Result Value Range   Glucose-Capillary 162 (*) 70 - 99 mg/dL  GLUCOSE, CAPILLARY     Status: Abnormal   Collection Time    06/22/13  7:10 AM      Result Value Range   Glucose-Capillary 156 (*) 70 - 99 mg/dL   Comment 1 Notify RN       HEENT: normal Cardio: RRR and no murmur Resp: CTA B/L and unlabored GI: BS positive and non distended Extremity:  Pulses positive and No Edema Skin:   Intact except R med hand abrasion, no drainage, left elbow normal, loop recorder incision healing well Neuro: Alert/Oriented, Cranial Nerve Abnormalities left central 7, Abnormal Sensory absent left hand and foot, Abnormal Motor 3- Left shoulder abd, add, Bi, Tri and Thumb ext,0/5 finger ext 3- L Hip Add, 3-/5 hip flexion, 3-/5 KE, trace toe flexion, Dysarthric and Other poor lip closure on Left, very poor motor control on Left Musc/Skel:  Other Left shoulder sublux Gen NAD   Assessment/Plan: 1. Functional deficits secondary to R MCA embolic infarct  which require 3+ hours per day of interdisciplinary therapy in a comprehensive inpatient rehab setting. Physiatrist is providing close team supervision and 24 hour management of active medical problems listed below. Physiatrist and rehab team continue to assess barriers to discharge/monitor patient progress toward functional and medical goals.  FIM: FIM - Bathing Bathing Steps Patient Completed: Chest;Right Arm;Left Arm;Abdomen Bathing: 5: Supervision: Safety issues/verbal cues  FIM - Upper Body Dressing/Undressing Upper body dressing/undressing steps patient completed: Thread/unthread left sleeve of pullover shirt/dress;Put head through opening of pull over shirt/dress;Pull shirt over trunk;Thread/unthread right sleeve of pullover shirt/dresss Upper body dressing/undressing: 5: Supervision: Safety issues/verbal cues FIM - Lower Body Dressing/Undressing Lower body  dressing/undressing steps patient completed: Don/Doff right shoe;Don/Doff left shoe;Fasten/unfasten right shoe;Fasten/unfasten left shoe Lower body dressing/undressing: 4: Steadying Assist  FIM - Toileting Toileting steps completed by patient: Performs perineal hygiene  Toileting: 1: Two helpers  FIM - Radio producer Devices: Elevated toilet seat;Grab bars Toilet Transfers: 3-To toilet/BSC: Mod A (lift or lower assist);3-From toilet/BSC: Mod A (lift or lower assist)  FIM - Bed/Chair Transfer Bed/Chair Transfer Assistive Devices: Bed rails;HOB elevated Bed/Chair Transfer: 5: Supine > Sit: Supervision (verbal cues/safety issues);5: Sit > Supine: Supervision (verbal cues/safety issues);4: Bed > Chair or W/C: Min A (steadying Pt. > 75%);4: Chair or W/C > Bed: Min A (steadying Pt. > 75%)  FIM - Locomotion: Wheelchair Distance: 180 Locomotion: Wheelchair: 5: Travels 150 ft or more: maneuvers on rugs and over door sills with supervision, cueing or coaxing FIM - Locomotion: Ambulation Locomotion: Ambulation Assistive Devices: Walker - Rolling (hand orthosis; left toe off) Ambulation/Gait Assistance: 1: +2 Total assist Locomotion: Ambulation: 1: Two helpers  Comprehension Comprehension Mode: Auditory Comprehension: 6-Follows complex conversation/direction: With extra time/assistive device  Expression Expression Mode: Verbal Expression: 6-Expresses complex ideas: With extra time/assistive device  Social Interaction Social Interaction: 6-Interacts appropriately with others with medication or extra time (anti-anxiety, antidepressant).  Problem Solving Problem Solving: 6-Solves complex problems: With extra time  Memory Memory: 6-Assistive device: No helper  Medical Problem List and Plan:  1. 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 Loop recorder implanted 06/07/13. On plavix 2. DVT Prophylaxis/Anticoagulation: SCDs. Monitor for any signs of DVT  3. Pain  Management: Tylenol as needed for  4. Neuropsych: This patient is capable of making decisions on his own behalf.  5. Hypertension. Norvasc 10 mg daily, Coreg 12.5 mg twice a day,increase Catapres 0.3 mg tid, lisinopril 40 mg daily. Monitor with increased mobility, off HCTZ, trial cardura, increase dose 6. Diabetes mellitus with peripheral neuropathy.CBGs generally good but elevation this am Hemoglobin A1c 6.2. DiaBeta 5 mg daily, Glucophage 1500 mg daily. Check blood sugars a.c. and at bedtime. Education by team  Dietary ed for DM and weight loss 7. Sinusitis identified on MRI. , monitor off abx 8.  Hypo K, KCL, now off HCTZ expect improvement LOS (Days) 15 A FACE TO FACE EVALUATION WAS PERFORMED  Deb Loudin E 06/22/2013, 7:21 AM

## 2013-06-22 NOTE — Progress Notes (Signed)
Physical Therapy Weekly Progress Note  Patient Details  Name: Donald [name] MRN: [id number] Date of Birth: 1945-06-05  Today's Date: 06/22/2013 Time: 1300-1405 Time Calculation (min): 65 min  Patient continues to make good progress and has met 4 of 4 short term goals.  Pt is currently min A for bed mobility, supervision lateral scoot transfers and for w/c mobility in controlled environment, max A for gait with RW and orthosis and max A for stair negotiation (+2 for safety).    Patient continues to demonstrate the following deficits: L hemiparesis with impaired sensation and proprioception, impaired motor control, timing and sequencing, impaired safety awareness, impaired postural control, balance, gait and therefore will continue to benefit from skilled PT intervention to enhance overall performance with balance, postural control, ability to compensate for deficits, functional use of  left upper extremity and left lower extremity, attention, awareness and coordination.  Patient progressing toward long term goals..  Continue plan of care.  PT Short Term Goals Week 2:  PT Short Term Goal 1 (Week 2): Pt will perform sit<>supine with min A PT Short Term Goal 1 - Progress (Week 2): Met PT Short Term Goal 2 (Week 2): Pt will perform sit<>stand with mod A PT Short Term Goal 2 - Progress (Week 2): Met PT Short Term Goal 3 (Week 2): Pt will propell w/c 150 feet with supervision PT Short Term Goal 3 - Progress (Week 2): Met PT Short Term Goal 4 (Week 2): Pt will ambulate 25 ft with max A +1 PT Short Term Goal 4 - Progress (Week 2): Met Week 3:  PT Short Term Goal 1 (Week 3): = LTG for D/C on 06/30/13  Skilled Therapeutic Interventions/Progress Updates:   Wife present for initiation of family education.  Pt performed w/c mobility x 180' in controlled environment with R hemi technique and supervision with pt better able to demonstrate safe obstacle negotiation, attention to L environment, w/c  positioning and set up with min cues.  Performed transfer to mat supervision.  Performed gait training with RW and L hand orthosis and L ToeOFF AFO x 80' + 25' with max A of one person but second person present to assist with RW guidance and for safety if needed; pt continued to require intermittent tactile cues for R lateral and anterior weight shift and L pelvic anterior rotation to initiate L swing, intermittent assistance for safe placement of LLE in stance and intermittent verbal cues for full LLE extension and advancement of COG over L stance.  Pt fatigued quickly.  After seated rest break performed stair negotiation training beginning with bilat UE support on rail with max A of one person with max verbal cues for sequencing and assistance to maintain grip on L rail, to fully advance LLE to next step and for stabilization of LLE in stance.    Following gait training had long discussion with wife and pt about D/C plan.  Wife unsure if D/C destination will be to daughter's house because of multiple members living in one house and asked if pt could stay an extra week until they find a place to live.  Discussed with wife that D/C disposition/destination decisions and discussion is started early in rehab stay so that family can determine destination by D/C and if goals are met pt is not allowed to stay until destination is determined.  With further discussion pt and wife feel that daughter's home will be best destination but will discuss it this weekend. Discussed entry to daughter's house which  is level entry and is level once inside.  Discussed equipment needs; pt reports son has RW but would like to order his own for home and pt measured for w/c for home and community mobility since recommendation is that pt not ambulate at home without assistance from trained family member.  Wife also asked about ordering hospital bed since their furniture is not here yet.  Also discussed transportation options.  Wife reports  they have tall SUV.  Will assess pt ability to transfer into/out of actual SUV tomorrow or Friday.  Pt to also have AFO consult tomorrow.  Pt left with OT for continued education.    Therapy Documentation Precautions:  Precautions Precautions: Fall Precaution Comments: LUE and LLE hemiparesis Restrictions Weight Bearing Restrictions: No Pain: Pain Assessment Pain Assessment: No/denies pain Locomotion : Ambulation Ambulation/Gait Assistance: 1: +2 Total assist Wheelchair Mobility Distance: 180   See FIM for current functional status  Therapy/Group: Individual Therapy  Raylene Everts [facility] 06/22/2013, 3:36 PM

## 2013-06-22 NOTE — Progress Notes (Signed)
Hypoglycemic Event  CBG: 70 at 1205  Treatment: 15 GM carbohydrate snack  Symptoms: None  Follow-up CBG: Time:1408 CBG Result:103  Possible Reasons for Event: Unknown  Comments/MD notified:[name] , PA . Patient eating lunch     Donald [name]  Remember to initiate Hypoglycemia Order Set & complete

## 2013-06-22 NOTE — Patient Care Conference (Signed)
Inpatient RehabilitationTeam Conference and Plan of Care Update Date: 06/22/2013   Time: 12;05 PM    Patient Name: Donald [name]      Medical Record Number: [id number]  Date of Birth: March 04, 1946 Sex: Male         Room/Bed: 2U23N/3I14E-31 Payor Info: Payor: MEDICARE / Plan: MEDICARE PART A AND B / Product Type: *No Product type* /    Admitting Diagnosis: CVA  Admit Date/Time:  06/07/2013  6:02 PM Admission Comments: No comment available   Primary Diagnosis:  CVA (cerebral infarction) Principal Problem: CVA (cerebral infarction)  Patient Active Problem List   Diagnosis Date Noted  . Small vessel disease 06/07/2013  . Obesity, unspecified 06/07/2013  . CVA (cerebral infarction) 06/07/2013  . large right middle cerebral artery infarct, embolic 54/00/8676  . Hypertension 06/03/2013  . Diabetes 06/03/2013    Expected Discharge Date: Expected Discharge Date: 06/30/13  Team Members Present: Physician leading conference: Dr. Alysia Penna Social Worker Present: Ovidio Kin, LCSW Nurse Present: Heather Roberts, RN PT Present: Raylene Everts, PT;Emily Rinaldo Cloud, PT OT Present: Simonne Come, OT;Kris Gellert, Maryella Shivers, OT SLP Present: Gunnar Fusi, SLP PPS Coordinator present : Daiva Nakayama, RN, CRRN     Current Status/Progress Goal Weekly Team Focus  Medical   Activity tolerance improving, increasing movement left side, morning systolic hypertension  Improved blood pressure prior to discharge, improve functional status to allow for home discharge  Adjust medications   Bowel/Bladder   continent bowel and bladder LBM 1-19   mod I  continue with plan of care    Swallow/Nutrition/ Hydration     [facility]        ADL's   min assist bathing at sit > stand level, min-supervision UB dressing, min-mod assist LB dressing, min-max assist squat pivot transfers. Pt requires max cues due to impulsivity and decreased proprioception in Lt side  min assist overall  standing balance, transfers, LUE NM  re-ed   Mobility   Supervision squat pivot transfers and w/c mobility with cues for safety, mod-max A gait with RW short distances and with AFO, stairs with lift equipment  Min A for transfers and gait short distances, mod  A stair negotiation, supervision w/c mobility  standing balance, gait, stairs, stand pivot transfers   Communication     [facility]        Safety/Cognition/ Behavioral Observations  aware of deficits on left side  calls for assist independently   mod I   continue to educate of safety    Pain   tylenol 650 mg po every 4 hours prn headache   less than 3   monitor for pain control    Skin   left skin tear to elbow tegaderm intact . tegaderm covering left chest loop recorder   no new breakdown   educate on skin care       *See Care Plan and progress notes for long and short-term goals.  Barriers to Discharge: Left neglect    Possible Resolutions to Barriers:  Continue rehabilitation, and see above    Discharge Planning/Teaching Needs:  Home with wife who may need to begin coming in for education-since much will be needed and to make sure comfortable with his care      Team Discussion:  Adjusting BP meds, learning diabetic diet.  Beginning to ambulate with PT.  Need AFO, will begin family education with wife.  Wife to measure doorways in daughter's home  Revisions to Treatment Plan:  None   Continued Need for  Acute Rehabilitation Level of Care: The patient requires daily medical management by a physician with specialized training in physical medicine and rehabilitation for the following conditions: Daily direction of a multidisciplinary physical rehabilitation program to ensure safe treatment while eliciting the highest outcome that is of practical value to the patient.: Yes Daily medical management of patient stability for increased activity during participation in an intensive rehabilitation regime.: Yes Daily analysis of laboratory values and/or radiology reports with  any subsequent need for medication adjustment of medical intervention for : Neurological problems  Lutisha Knoche, Gardiner Rhyme 06/22/2013, 3:26 PM

## 2013-06-22 NOTE — Progress Notes (Signed)
Social Work [name], LCSW Social Worker Signed  Patient Care Conference Service date: 06/22/2013 3:26 PM  Inpatient RehabilitationTeam Conference and Plan of Care Update Date: 06/22/2013   Time: 12;05 PM     Patient Name: Donald [name]       Medical Record Number: [id number]   Date of Birth: [birth year] Sex: Male         Room/Bed: 9K24O/9B35H-29 Payor Info: Payor: MEDICARE / Plan: MEDICARE PART A AND B / Product Type: *No Product type* /   Admitting Diagnosis: CVA   Admit Date/Time:  06/07/2013  6:02 PM Admission Comments: No comment available   Primary Diagnosis:  CVA (cerebral infarction) Principal Problem: CVA (cerebral infarction)    Patient Active Problem List     Diagnosis  Date Noted   .  Small vessel disease  06/07/2013   .  Obesity, unspecified  06/07/2013   .  CVA (cerebral infarction)  06/07/2013   .  large right middle cerebral artery infarct, embolic  92/42/6834   .  Hypertension  06/03/2013   .  Diabetes  06/03/2013     Expected Discharge Date: Expected Discharge Date: 06/30/13  Team Members Present: Physician leading conference: Dr. Alysia Penna Social Worker Present: Ovidio Kin, LCSW Nurse Present: Heather Roberts, RN PT Present: Raylene Everts, PT;Emily Rinaldo Cloud, PT OT Present: Simonne Come, OT;Kris Gellert, Maryella Shivers, OT SLP Present: Gunnar Fusi, SLP PPS Coordinator present : Daiva Nakayama, RN, CRRN        Current Status/Progress  Goal  Weekly Team Focus   Medical     Activity tolerance improving, increasing movement left side, morning systolic hypertension  Improved blood pressure prior to discharge, improve functional status to allow for home discharge  Adjust medications   Bowel/Bladder     continent bowel and bladder LBM 1-19   mod I  continue with plan of care    Swallow/Nutrition/ Hydration     [facility]       ADL's     min assist bathing at sit > stand level, min-supervision UB dressing, min-mod assist LB dressing, min-max assist squat  pivot transfers. Pt requires max cues due to impulsivity and decreased proprioception in Lt side  min assist overall  standing balance, transfers, LUE NM re-ed   Mobility     Supervision squat pivot transfers and w/c mobility with cues for safety, mod-max A gait with RW short distances and with AFO, stairs with lift equipment  Min A for transfers and gait short distances, mod  A stair negotiation, supervision w/c mobility  standing balance, gait, stairs, stand pivot transfers   Communication     [facility]       Safety/Cognition/ Behavioral Observations    aware of deficits on left side  calls for assist independently   mod I   continue to educate of safety    Pain     tylenol 650 mg po every 4 hours prn headache   less than 3   monitor for pain control    Skin     left skin tear to elbow tegaderm intact . tegaderm covering left chest loop recorder   no new breakdown   educate on skin care      *See Care Plan and progress notes for long and short-term goals.    Barriers to Discharge:  Left neglect      Possible Resolutions to Barriers:    Continue rehabilitation, and see above      Discharge Planning/Teaching Needs:  Home with wife who may need to begin coming in for education-since much will be needed and to make sure comfortable with his care      Team Discussion:    Adjusting BP meds, learning diabetic diet.  Beginning to ambulate with PT.  Need AFO, will begin family education with wife.  Wife to measure doorways in daughter's home   Revisions to Treatment Plan:    None    Continued Need for Acute Rehabilitation Level of Care: The patient requires daily medical management by a physician with specialized training in physical medicine and rehabilitation for the following conditions: Daily direction of a multidisciplinary physical rehabilitation program to ensure safe treatment while eliciting the highest outcome that is of practical value to the patient.: Yes Daily medical management  of patient stability for increased activity during participation in an intensive rehabilitation regime.: Yes Daily analysis of laboratory values and/or radiology reports with any subsequent need for medication adjustment of medical intervention for : Neurological problems  Kielee Care, Gardiner Rhyme 06/22/2013, 3:26 PM         [name], LCSW Social Worker Signed  Patient Care Conference Service date: 06/15/2013 2:21 PM  Inpatient RehabilitationTeam Conference and Plan of Care Update Date: 06/15/2013   Time: 11:30 Am     Patient Name: Donald [name]       Medical Record Number: [id number]   Date of Birth: 12-Jan-1946 Sex: Male         Room/Bed: 9R41U/3A45X-64 Payor Info: Payor: MEDICARE / Plan: MEDICARE PART A AND B / Product Type: *No Product type* /   Admitting Diagnosis: CVA   Admit Date/Time:  06/07/2013  6:02 PM Admission Comments: No comment available   Primary Diagnosis:  CVA (cerebral infarction) Principal Problem: CVA (cerebral infarction)    Patient Active Problem List     Diagnosis  Date Noted   .  Small vessel disease  06/07/2013   .  Obesity, unspecified  06/07/2013   .  CVA (cerebral infarction)  06/07/2013   .  large right middle cerebral artery infarct, embolic  68/07/2120   .  Hypertension  06/03/2013   .  Diabetes  06/03/2013     Expected Discharge Date: Expected Discharge Date: 06/30/13  Team Members Present: Physician leading conference: Dr. Alysia Penna Social Worker Present: Ovidio Kin, LCSW Nurse Present: Nanine Means, RN PT Present: Raylene Everts, PT;Emily Rinaldo Cloud, PT OT Present: Simonne Come, Starling Manns, Maryella Shivers, OT SLP Present: Gunnar Fusi, SLP PPS Coordinator present : Daiva Nakayama, RN, CRRN        Current Status/Progress  Goal  Weekly Team Focus   Medical     trnaferring better , occ incont, poor awareness of deficits  100% cont  avoid bedpan use   Bowel/Bladder     continent of bowel and bladder LBM 1/14  mod I   continue to monitor patient   Swallow/Nutrition/ Hydration     regular textures and thin liquids  Mod I  education copleted and goals met   ADL's     mod assist bathing at bed level, +2 at sit > stand level, mod assist UB dressing, mod assist LB dressing +2 to pull up pants, min assist squat pivot to Rt, max assist to Lt  min assist overall  standing balance, transfers, LB dressing, LUE NM re-ed   Mobility     Supervision transfers, min A w/c mobility, +2 standing, +2 gait with [place] and [place]on Pier walker  Min A overall  Standing balance,  gait, LE NMR, safety awarness/attention to L   Communication     Mod I- Independent  Mod I  education completed and goals met   Safety/Cognition/ Behavioral Observations    No safety concerns       Pain     Tylenol 650 mg every 4 hours prn  less than 3   monitor for effectiveness   Skin     no breakdown noted  no breakdown   monitor patient and continue to educate patient     *See Care Plan and progress notes for long and short-term goals.    Barriers to Discharge:  left neglect      Possible Resolutions to Barriers:    cont rehab, coordinate PT/OT RN      Discharge Planning/Teaching Needs:    Home with wife who can provide care, will need family education prior to discharge.       Team Discussion:    Making good progress-neglect showing more this week, running into things on his left.  Use bsc and bathroom instead of bedpan, voids better.  Pt directing his care.   Revisions to Treatment Plan:    D/C speech met goals    Continued Need for Acute Rehabilitation Level of Care: The patient requires daily medical management by a physician with specialized training in physical medicine and rehabilitation for the following conditions: Daily direction of a multidisciplinary physical rehabilitation program to ensure safe treatment while eliciting the highest outcome that is of practical value to the patient.: Yes Daily medical management of patient  stability for increased activity during participation in an intensive rehabilitation regime.: Yes Daily analysis of laboratory values and/or radiology reports with any subsequent need for medication adjustment of medical intervention for : Neurological problems  Lana Flaim, Gardiner Rhyme 06/16/2013, 2:40 PM         [name], [place] Worker Signed  Patient Care Conference Service date: 06/08/2013 1:42 PM  Inpatient RehabilitationTeam Conference and Plan of Care Update Date: 06/08/2013   Time: 11;15 am     Patient Name: Donald [name]       Medical Record Number: [id number]   Date of Birth: 09/02/1945 Sex: Male         Room/Bed: 1M38G/6K59D-35 Payor Info: Payor: MEDICARE / Plan: MEDICARE PART A AND B / Product Type: *No Product type* /   Admitting Diagnosis: CVA   Admit Date/Time:  06/07/2013  6:02 PM Admission Comments: No comment available   Primary Diagnosis:  CVA (cerebral infarction) Principal Problem: CVA (cerebral infarction)    Patient Active Problem List     Diagnosis  Date Noted   .  Small vessel disease  06/07/2013   .  Obesity, unspecified  06/07/2013   .  CVA (cerebral infarction)  06/07/2013   .  large right middle cerebral artery infarct, embolic  70/17/7939   .  Hypertension  06/03/2013   .  Diabetes  06/03/2013     Expected Discharge Date: Expected Discharge Date: 06/30/13  Team Members Present: Physician leading conference: Dr. Alysia Penna Social Worker Present: Ovidio Kin, LCSW Nurse Present: Heather Roberts, RN PT Present: Raylene Everts, PT;Emily Rinaldo Cloud, PT OT Present: Antony Salmon, Jules Schick, OT PPS Coordinator present : Daiva Nakayama, RN, CRRN        Current Status/Progress  Goal  Weekly Team Focus   Medical     Constipation, severe weakness in RUE adn RLE as wellas Poor sensation  upgrae strength, regulate bowels  initiate therapy program, adjust  meds   Bowel/Bladder     continent bowel and bladder loose bm this am LBM 1-07  mod I  monitor     Swallow/Nutrition/ Hydration     Dys 3 textures and thin liquids, intermittent supervision  Mod I  trials of regular textures, increase utilization of swallowing strategies   ADL's     max assist bathing and UB dressing, +2 LB dressing and squat pivot transfer to toilet, max assist sit <> stand  min assist overall  sitting and standing balance, transfers, self-care retraining   Mobility     Min-mod A sitting balance, max A bed mobility, +2 transfers  Min A overall  Sitting balance, transfers, standing, LE NMR    Communication     mildly dysarthric speech  Mod I  utilization of speech intelligibility strategies   Safety/Cognition/ Behavioral Observations    No unsafe behaviors       Pain     tylenol 650 mg po prn every 4 hous   less than 3  monitor effectiveness of pain meds    Skin     dressing to loop recorder   now new breakdown   monitor skin and educate family on skin care      *See Care Plan and progress notes for long and short-term goals.    Barriers to Discharge:  heavy assist level, large pt      Possible Resolutions to Barriers:    cont rehab to upgrade to min A      Discharge Planning/Teaching Needs:    Home with wife providing the care-re-locating here and are making arrangements of where and the plan at discharge.    Team Discussion:    Goals-min level-poor sensation, r-gaze pref.  Loop recorder placed yesterday.  Evaluating in all areas   Revisions to Treatment Plan:    New eval    Continued Need for Acute Rehabilitation Level of Care: The patient requires daily medical management by a physician with specialized training in physical medicine and rehabilitation for the following conditions: Daily direction of a multidisciplinary physical rehabilitation program to ensure safe treatment while eliciting the highest outcome that is of practical value to the patient.: Yes Daily medical management of patient stability for increased activity during participation in  an intensive rehabilitation regime.: Yes Daily analysis of laboratory values and/or radiology reports with any subsequent need for medication adjustment of medical intervention for : Neurological problems;Other  [name] 06/10/2013, 8:38 AM          Patient ID: [name], male   DOB: 1945/07/20, 68 y.o.   MRN: [id number]

## 2013-06-22 NOTE — Plan of Care (Signed)
Problem: Food- and Nutrition-Related Knowledge Deficit (NB-1.1) Goal: Nutrition education Formal process to instruct or train a patient/client in a skill or to impart knowledge to help patients/clients voluntarily manage or modify food choices and eating behavior to maintain or improve health. Outcome: Completed/Met Date Met:  06/22/13 Pt was identified for a consult on reinforcing education from prior diet education given on 1/19 about a heart healthy diet and weight loss.   Education with the pt was reinforced on a heart healthy diet. Pt was educated on a low sodium diet, whole grain foods, and low fat foods. Pt verbalized understanding about foods that are high in sodium, such as ham and sausage. Pt understands the importance of not adding extra salt in foods and is familiar with seasonings that are salt free that he can use. Pt understands the importance of choosing whole grain foods, such as whole wheat toast over biscuits, and the importance of choosing low fat foods. Pt reports he has been choosing healthy options for breakfast (scrambled eggs with tomato slices accompanied by Mrs. Dash seasoning).   A simple weight loss education was addressed to the pt as the pt was interested in losing weight. Education was only reinforced that weight loss during this time is not advised as full nutrition intake is important for healing. Pt was educated that once pt is discharged to go home and is feeling back to normal with restoration of strength, then weight loss can be a possible option for the pt. Pt verbalized his understanding that currently he needs to focus on meeting his nutrition and not on wanting to lose weight.   Pt asked questions about insulin regiment while in hospital. Education was verbalized on while in the hospital the pt will continue to get insulin, but once pt is discharged he will continue to take his regular diabetes medication he had PTA. Pt also questioned about why he has to drink  soda when his blood sugar drops below range as pt does not like soda. Recommendations was made that pt may ask (per RN discretion) for substitutions to help raise his blood sugars back up to normal range, such as fruit in place of soda.   No handout was given during the education. Good compliance is expected from pt.

## 2013-06-22 NOTE — Progress Notes (Signed)
Physical Therapy Session Note  Patient Details  Name: Donald [name] MRN: [id number] Date of Birth: Oct 21, 1945  Today's Date: 06/22/2013 Time: 0300-9233 Time Calculation (min): 45 min   Skilled Therapeutic Interventions/Progress Updates:  1:1. Pt received sitting in w/c, stated feeling very fatigued but agreeable to participate in therapy. Pt req (S) w/ consistent cueing to attend to positioning of L LE prior to lateral scoot t/fs tx mat<>w/c>recliner. Focus on L quad control in standing w/ use of RW, tactile cueing and overall mod A during mini squats, toe taps on 4" step and alternated stepping. Pt able to propel w/c room<>therapy gym w/ L LE, (S), min cueing for obstacle negotiation in tight spaces in gym. Pt sitting in recliner at end of session in care of nurse tech and wife in room.   Therapy Documentation Precautions:  Precautions Precautions: Fall Precaution Comments: LUE and LLE hemiparesis Restrictions Weight Bearing Restrictions: No  See FIM for current functional status  Therapy/Group: Individual Therapy  Gilmore Laroche 06/22/2013, 3:48 PM

## 2013-06-23 ENCOUNTER — Inpatient Hospital Stay (HOSPITAL_COMMUNITY): Payer: Medicare Other | Admitting: Physical Therapy

## 2013-06-23 ENCOUNTER — Inpatient Hospital Stay (HOSPITAL_COMMUNITY): Payer: Medicare Other | Admitting: Occupational Therapy

## 2013-06-23 LAB — GLUCOSE, CAPILLARY
Glucose-Capillary: 129 mg/dL — ABNORMAL HIGH (ref 70–99)
Glucose-Capillary: 160 mg/dL — ABNORMAL HIGH (ref 70–99)
Glucose-Capillary: 82 mg/dL (ref 70–99)
Glucose-Capillary: 106 mg/dL — ABNORMAL HIGH (ref 70–99)
Glucose-Capillary: 83 mg/dL (ref 70–99)

## 2013-06-23 NOTE — Plan of Care (Signed)
Agree with education as documented by student intern provided via teach back method.  Pt verbalized understanding of all information presented. Ongoing education r/t to heart healthy diet, diabetic diet, and wt loss has been provided to pt.   Brynda Greathouse, MS RD LDN Clinical Inpatient Dietitian Pager: [phone] Weekend/After hours pager: [phone]

## 2013-06-23 NOTE — Progress Notes (Signed)
Physical Therapy Session Note  Patient Details  Name: Donald [name] MRN: [id number] Date of Birth: February 27, 1946  Today's Date: 06/23/2013 Time: 8676-1950 and 9326-7124 Time Calculation (min): 59 min and 45 min   Short Term Goals: Week 3:  PT Short Term Goal 1 (Week 3): = LTG for D/C on 06/30/13  Skilled Therapeutic Interventions/Progress Updates:   P & O present for AFO assessment.  Performed w/c mobility x 150' mod I with use of LUE to lock, unlock brake.  Performed gait with RW x 50' with mod-max A in controlled environment with L hand orthosis and Toe OFF [place] with assistance required to maintain straight navigation with RW, facilitation at L foot for safe placement and at pelvis and trunk for anterior rotation and elongation.  Pt presented with intermittent L knee buckling or genu recurvatum when pelvis remained in posterior rotation and pt presented with step to gait sequence with poor forward weight shift and advancement of RW decreasing efficiency of gait.  Determined with P&O that pt would benefit from stiffer [place] AFO with heel wedge for improved leverage and to prevent failure of brace.  Continued gait training with use of wall rail instead of RW to improve anterior weight shifting and maintain momentum.  Performed gait forwards and retro stepping x 25' with R wall rail with mod A with continued assistance to place L foot but decreased tactile and verbal cues needed for trunk elongation or rotation.  During rest break continued to discuss with pt setting up real car transfer with wife and D/C destination and planning.  Pt to call wife and see if she can be present for pm session for car transfer training.  Attempted to perform squat with UE support on rail with trunk ER with LLE planted for closed chain hip IR and increased ROM in hip secondary to significant tightness into ER.  Unable to release rail and maintain balance to perform reaching.  Returned to room; will attempt again in  pm or tomorrow.    PM session: Wife unable to come for car transfer training this pm but set up time with wife to perform tomorrow afternoon.  Pt performed w/c mobility to gym mod I.  Performed NMR to facilitate increased L pelvic anterior and posterior rotation over fixed LLE (closed chain) beginning in sitting with LUE and LLE stabilized to allow trunk/pelvis to move on femur in IR while reaching with RUE across midline to place rings on target on L.  Attempted same movement in squat position with LUE supported on mat and LLE in fixed position but pt unable to maintain weight through LUE and LLE.  Changed to full standing position and performed lateral weight shifting with trunk and pelvic L and R anterior rotations with bilat UE in fixed WB on mat; changed to combining LUE grasping and reaching tasks of picking up rings from mat and performing trunk and pelvic anterior rotation over fixed femur to place ring on target on R side (LUE reaching across midline) to facilitate closed chain hip rotation and L lateral weight shift and LLE extension.  Pt tolerated well.  Left in w/c in gym to rest before OT session.    Therapy Documentation Precautions:  Precautions Precautions: Fall Precaution Comments: LUE and LLE hemiparesis Restrictions Weight Bearing Restrictions: No Pain: Pain Assessment Pain Assessment: No/denies pain Locomotion : Ambulation Ambulation/Gait Assistance: 3: Mod assist Wheelchair Mobility Distance: 150   See FIM for current functional status  Therapy/Group: Individual Therapy  Raylene Everts  [place] 06/23/2013, 12:24 PM

## 2013-06-23 NOTE — Progress Notes (Signed)
68 y.o. right-handed male with history of hypertension as well as diabetes mellitus. Patient is from [place] and was visiting family in [place]. Admitted 06/03/2013 of left-sided weakness and slurred speech. 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 Echocardiogram with ejection fraction 36% grade 1 diastolic dysfunction. CT angiogram head and neck with no proximal branch occlusion or stenosis. Patient did receive TPA. Neurology services consulted placed on Plavix for CVA prophylaxis. TEE showed no PFO or thrombus loop recorder just implanted to rule out arrhythmia as potential cause. Acute on chronic paranasal sinusitis identified on MRI of the brain placed on amoxicillin  Subjective/Complaints: Pt without issues except tired after therapy, discussed am systolic BP issues Review of Systems - Negative except weakness on Left and as above  Objective: Vital Signs: Blood pressure 171/91, pulse 56, temperature 97.6 F (36.4 C), temperature source Oral, resp. rate 18, height 6' 3"  (1.905 m), weight 118.389 kg (261 lb), SpO2 97.00%. No results found. Results for orders placed during the hospital encounter of 06/07/13 (from the past 72 hour(s))  GLUCOSE, CAPILLARY     Status: Abnormal   Collection Time    06/20/13  7:34 AM      Result Value Range   Glucose-Capillary 127 (*) 70 - 99 mg/dL  BASIC METABOLIC PANEL     Status: Abnormal   Collection Time    06/20/13  8:30 AM      Result Value Range   Sodium 141  137 - 147 mEq/L   Potassium 3.3 (*) 3.7 - 5.3 mEq/L   Chloride 102  96 - 112 mEq/L   CO2 25  19 - 32 mEq/L   Glucose, Bld 191 (*) 70 - 99 mg/dL   BUN 17  6 - 23 mg/dL   Creatinine, Ser 0.97  0.50 - 1.35 mg/dL   Calcium 8.9  8.4 - 10.5 mg/dL   GFR calc non Af Amer 85 (*) >90 mL/min   GFR calc Af Amer >90  >90 mL/min   Comment: (NOTE)     The  eGFR has been calculated using the CKD EPI equation.     This calculation has not been validated in all clinical situations.     eGFR's persistently <90 mL/min signify possible Chronic Kidney     Disease.  GLUCOSE, CAPILLARY     Status: None   Collection Time    06/20/13 12:28 PM      Result Value Range   Glucose-Capillary 92  70 - 99 mg/dL  GLUCOSE, CAPILLARY     Status: None   Collection Time    06/20/13  5:17 PM      Result Value Range   Glucose-Capillary 96  70 - 99 mg/dL  GLUCOSE, CAPILLARY     Status: Abnormal   Collection Time    06/20/13  8:52 PM      Result Value Range   Glucose-Capillary 123 (*) 70 - 99 mg/dL   Comment 1 Notify RN    GLUCOSE, CAPILLARY     Status: Abnormal   Collection Time    06/21/13  7:05 AM      Result Value Range   Glucose-Capillary 154 (*) 70 - 99 mg/dL   Comment 1 Notify RN    GLUCOSE, CAPILLARY     Status: None   Collection Time    06/21/13 12:05 PM      Result Value Range  Glucose-Capillary 70  70 - 99 mg/dL  GLUCOSE, CAPILLARY     Status: Abnormal   Collection Time    06/21/13  2:08 PM      Result Value Range   Glucose-Capillary 103 (*) 70 - 99 mg/dL  GLUCOSE, CAPILLARY     Status: None   Collection Time    06/21/13  4:51 PM      Result Value Range   Glucose-Capillary 87  70 - 99 mg/dL   Comment 1 Notify RN    GLUCOSE, CAPILLARY     Status: Abnormal   Collection Time    06/21/13  8:49 PM      Result Value Range   Glucose-Capillary 162 (*) 70 - 99 mg/dL  GLUCOSE, CAPILLARY     Status: Abnormal   Collection Time    06/22/13  7:10 AM      Result Value Range   Glucose-Capillary 156 (*) 70 - 99 mg/dL   Comment 1 Notify RN    GLUCOSE, CAPILLARY     Status: Abnormal   Collection Time    06/22/13 11:28 AM      Result Value Range   Glucose-Capillary 105 (*) 70 - 99 mg/dL   Comment 1 Notify RN    GLUCOSE, CAPILLARY     Status: None   Collection Time    06/22/13  4:40 PM      Result Value Range   Glucose-Capillary 95  70 - 99  mg/dL   Comment 1 Notify RN       HEENT: normal Cardio: RRR and no murmur Resp: CTA B/L and unlabored GI: BS positive and non distended Extremity:  Pulses positive and No Edema Skin:   Intact except R med hand abrasion, no drainage, left elbow normal, loop recorder incision healing well Neuro: Alert/Oriented, Cranial Nerve Abnormalities left central 7, Abnormal Sensory absent left hand and foot, Abnormal Motor 3- Left shoulder abd, add, Bi, Tri and Thumb ext,0/5 finger ext 3- L Hip Add, 3-/5 hip flexion, 3-/5 KE, trace toe flexion, Dysarthric and Other poor lip closure on Left, very poor motor control on Left Musc/Skel:  Other Left shoulder sublux Gen NAD   Assessment/Plan: 1. Functional deficits secondary to R MCA embolic infarct  which require 3+ hours per day of interdisciplinary therapy in a comprehensive inpatient rehab setting. Physiatrist is providing close team supervision and 24 hour management of active medical problems listed below. Physiatrist and rehab team continue to assess barriers to discharge/monitor patient progress toward functional and medical goals.  FIM: FIM - Bathing Bathing Steps Patient Completed: Chest;Right Arm;Left Arm;Abdomen Bathing: 5: Supervision: Safety issues/verbal cues  FIM - Upper Body Dressing/Undressing Upper body dressing/undressing steps patient completed: Thread/unthread left sleeve of pullover shirt/dress;Put head through opening of pull over shirt/dress;Pull shirt over trunk;Thread/unthread right sleeve of pullover shirt/dresss Upper body dressing/undressing: 5: Supervision: Safety issues/verbal cues FIM - Lower Body Dressing/Undressing Lower body dressing/undressing steps patient completed: Don/Doff right shoe;Don/Doff left shoe;Fasten/unfasten right shoe;Fasten/unfasten left shoe Lower body dressing/undressing: 5: Set-up assist to: Don/Doff AFO/prosthesis/orthosis  FIM - Toileting Toileting steps completed by patient: Performs perineal  hygiene Toileting: 1: Two helpers  FIM - Radio producer Devices: Elevated toilet seat;Grab bars Toilet Transfers: 3-To toilet/BSC: Mod A (lift or lower assist);3-From toilet/BSC: Mod A (lift or lower assist)  FIM - Bed/Chair Transfer Bed/Chair Transfer Assistive Devices: Bed rails;HOB elevated Bed/Chair Transfer: 5: Chair or W/C > Bed: Supervision (verbal cues/safety issues);5: Bed > Chair or W/C: Supervision (verbal cues/safety  issues)  FIM - Locomotion: Wheelchair Distance: 180 Locomotion: Wheelchair: 5: Travels 150 ft or more: maneuvers on rugs and over door sills with supervision, cueing or coaxing FIM - Locomotion: Ambulation Locomotion: Ambulation Assistive Devices: Walker - Rolling;Orthosis Ambulation/Gait Assistance: 1: +2 Total assist Locomotion: Ambulation: 1: Two helpers  Comprehension Comprehension Mode: Auditory Comprehension: 6-Follows complex conversation/direction: With extra time/assistive device  Expression Expression Mode: Verbal Expression: 5-Expresses basic needs/ideas: With extra time/assistive device  Social Interaction Social Interaction: 6-Interacts appropriately with others with medication or extra time (anti-anxiety, antidepressant).  Problem Solving Problem Solving: 6-Solves complex problems: With extra time  Memory Memory: 6-Assistive device: No helper  Medical Problem List and Plan:  1. 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 Loop recorder implanted 06/07/13. On plavix 2. DVT Prophylaxis/Anticoagulation: SCDs. Monitor for any signs of DVT  3. Pain Management: Tylenol as needed for  4. Neuropsych: This patient is capable of making decisions on his own behalf.  5. Hypertension. Norvasc 10 mg daily, Coreg 12.5 mg twice a day,increase Catapres 0.3 mg tid, lisinopril 40 mg daily. Monitor with increased mobility, off HCTZ, trial cardura, increase dose 6. Diabetes mellitus with peripheral neuropathy.CBGs generally good but  elevation this am Hemoglobin A1c 6.2. DiaBeta 5 mg daily, Glucophage 1500 mg daily. Check blood sugars a.c. and at bedtime. Education by team  Dietary ed for DM and weight loss 7. Sinusitis identified on MRI. , monitor off abx 8.  Hypo K, KCL, now off HCTZ expect improvement, recheck BMET LOS (Days) 16 A FACE TO FACE EVALUATION WAS PERFORMED  KIRSTEINS,ANDREW E 06/23/2013, 5:59 AM

## 2013-06-23 NOTE — Progress Notes (Signed)
Occupational Therapy Session Note  Patient Details  Name: Naftula Donahue MRN: [id number] Date of Birth: 02-12-1946  Today's Date: 06/23/2013 Time: 2671-2458 and 1350-1420 Time Calculation (min): 55 min and 30 min  Short Term Goals: Week 3:  OT Short Term Goal 1 (Week 3): Pt will complete toilet transfer with min assist 5/5 opportunities OT Short Term Goal 2 (Week 3): Pt will complete transfer to tub bench with min assist of one caregiver with fewer than 25% cues for safety and sequencing OT Short Term Goal 3 (Week 3): Pt will complete toileting (hygiene and clothing management) with min assist OT Short Term Goal 4 (Week 3): Pt will complete LB dressing with min assist OT Short Term Goal 5 (Week 3): Pt will utilize LUE as gross assist to complete grooming tasks.  Skilled Therapeutic Interventions/Progress Updates:    1) Pt seen for ADL retraining with focus on sit <> stand and functional use of LUE during self-care tasks of bathing and dressing. Pt in w/c upon arrival, demonstrated increased grasp and unlocked Lt brake with Lt hand. Pt completed bathing at sink in seated position as reports he completed LB bathing and dressing post toileting early this AM and opting to not shower to due not having breakfast yet. Pt continues to demonstrate decreased safety awareness and impulsivity with mobility and decreased awareness of LUE positioning as often dangling in w/c wheel. Noted increased LUE ROM and functional use with UB bathing, still tends to leave LUE to the side when attempting UB dressing. Pt demonstrated increased [place] and coordination with LUE with washing Rt arm and obtaining deodorant from counter as well as grasping travel size toothpaste and maintaining gross grasp while unscrewing cap.   2) Pt seen for 1:1 OT with focus on LUE NM re-ed in sitting and standing.  Pt demonstrating increased trunk control in standing, however still requiring min assist at hips to maintain positioning.  Pt  attempted to stand with LUE curved underneath and required cues to correct placement prior to standing.  Weight bearing through LUE in standing with reaching across midline, followed by seated task with reaching for checkers with Lt hand with focus on motor control, grading of movement, and pinch and 3 jaw chuck - pt dropping checkers on floor approx 50% of time.  Therapy Documentation Precautions:  Precautions Precautions: Fall Precaution Comments: LUE and LLE hemiparesis Restrictions Weight Bearing Restrictions: No Pain:  Pt with no c/o pain  See FIM for current functional status  Therapy/Group: Individual Therapy  Simonne Come 06/23/2013, 11:11 AM

## 2013-06-24 ENCOUNTER — Inpatient Hospital Stay (HOSPITAL_COMMUNITY): Payer: Medicare Other | Admitting: Physical Therapy

## 2013-06-24 ENCOUNTER — Inpatient Hospital Stay (HOSPITAL_COMMUNITY): Payer: Medicare Other | Admitting: Occupational Therapy

## 2013-06-24 DIAGNOSIS — I6340 Cerebral infarction due to embolism of unspecified cerebral artery: Secondary | ICD-10-CM

## 2013-06-24 DIAGNOSIS — G8110 Spastic hemiplegia affecting unspecified side: Secondary | ICD-10-CM

## 2013-06-24 DIAGNOSIS — I69991 Dysphagia following unspecified cerebrovascular disease: Secondary | ICD-10-CM

## 2013-06-24 LAB — BASIC METABOLIC PANEL
BUN: 13 mg/dL (ref 6–23)
CO2: 24 mEq/L (ref 19–32)
Calcium: 9.2 mg/dL (ref 8.4–10.5)
Chloride: 104 mEq/L (ref 96–112)
Creatinine, Ser: 0.8 mg/dL (ref 0.50–1.35)
GFR calc Af Amer: >90 mL/min (ref >90)
GFR calc non Af Amer: >90 mL/min (ref >90)
Glucose, Bld: 124 mg/dL — ABNORMAL HIGH (ref 70–99)
Potassium: 3.7 mEq/L (ref 3.7–5.3)
Sodium: 142 mEq/L (ref 137–147)

## 2013-06-24 LAB — GLUCOSE, CAPILLARY
Glucose-Capillary: 145 mg/dL — ABNORMAL HIGH (ref 70–99)
Glucose-Capillary: 111 mg/dL — ABNORMAL HIGH (ref 70–99)
Glucose-Capillary: 100 mg/dL — ABNORMAL HIGH (ref 70–99)
Glucose-Capillary: 135 mg/dL — ABNORMAL HIGH (ref 70–99)
Glucose-Capillary: 163 mg/dL — ABNORMAL HIGH (ref 70–99)

## 2013-06-24 NOTE — Progress Notes (Signed)
Orthopedic Tech Progress Note Patient Details:  Donald [name] Feb 26, 1946 [id number]  Patient ID: Donald [name], male   DOB: [birth year], 68 y.o.   MRN: [id number] Brace order completed by [facility]  Donald [name] 06/24/2013, 1:53 PM

## 2013-06-24 NOTE — Progress Notes (Signed)
Physical Therapy Session Note  Patient Details  Name: Marquett Bertoli MRN: [id number] Date of Birth: Jul 04, 1945  Today's Date: 06/24/2013 Time: 3968-8648  Time Calculation (min): 53 min  Short Term Goals: Week 2:  PT Short Term Goal 1 (Week 2): Pt will perform sit<>supine with min A PT Short Term Goal 1 - Progress (Week 2): Met PT Short Term Goal 2 (Week 2): Pt will perform sit<>stand with mod A PT Short Term Goal 2 - Progress (Week 2): Met PT Short Term Goal 3 (Week 2): Pt will propell w/c 150 feet with supervision PT Short Term Goal 3 - Progress (Week 2): Met PT Short Term Goal 4 (Week 2): Pt will ambulate 25 ft with max A +1 PT Short Term Goal 4 - Progress (Week 2): Met Week 3:  PT Short Term Goal 1 (Week 3): = LTG for D/C on 06/30/13  Skilled Therapeutic Interventions/Progress Updates:   Pt in w/c shaving.  Set up car transfer training this afternoon with wife; explained to wife over the phone how to get to covered entrance secondary to rainy weather.  Transported to gym in w/c.  Performed NMR; see below for details.  Returned to room and assisted pt with donning TED hose secondary to notable edema collecting in bilat LE with LE in dependent position.     PM session:   Pt missed 60 minutes pm PT session and family education secondary to fatigue and weakness following hypotensive episode on toilet with nausea and diarrhea.  Despite BP now WNL pt continues to feel poorly and requested to rest during the afternoon.  Pt in bed with wife present.   Therapy Documentation Precautions:  Precautions Precautions: Fall Precaution Comments: LUE and LLE hemiparesis Restrictions Weight Bearing Restrictions: No Pain: Pain Assessment Pain Assessment: No/denies pain Locomotion : Ambulation Ambulation/Gait Assistance: 3: Mod assist Wheelchair Mobility Distance: 150  Other Treatments: Treatments Neuromuscular Facilitation: Left;Lower Extremity;Activity to increase coordination;Activity to  increase motor control;Activity to increase timing and sequencing;Activity to increase sustained activation;Activity to increase lateral weight shifting;Activity to increase anterior-posterior weight shifting with bilat UE on wall rails while performing lateral stepping to L and R x 25' each direction without AFO but with focus on active motor control of LLE ABD and ADD, full L lateral weight shift and activation of LLE hip and knee extensors for knee stability without buckling or genu recurvatum; required mod A for full L lateral weight shift and timing of hip and knee extension.    See FIM for current functional status  Therapy/Group: Individual Therapy  Raylene Everts [facility] 06/24/2013, 12:36 PM

## 2013-06-24 NOTE — Progress Notes (Signed)
1330; Pt to toilet with success and had large amount of watery stool; fast empty time. Reported this is the third time today. First was norm and last two were watery, something has upset my stomach. Became clammy, sweaty and dizzy . Assisted back to bed to rest. CBG 100. BP 144/76 Pulse 62 and 99% on room air. STill feels little weird however better than earlier. Offered oral fluids. Dan [place] notified of incident. Margarito Liner

## 2013-06-24 NOTE — Progress Notes (Signed)
68 y.o. right-handed male with history of hypertension as well as diabetes mellitus. Patient is from [place] and was visiting family in [place]. Admitted 06/03/2013 of left-sided weakness and slurred speech. 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 Echocardiogram with ejection fraction 93% grade 1 diastolic dysfunction. CT angiogram head and neck with no proximal branch occlusion or stenosis. Patient did receive TPA. Neurology services consulted placed on Plavix for CVA prophylaxis. TEE showed no PFO or thrombus loop recorder just implanted to rule out arrhythmia as potential cause. Acute on chronic paranasal sinusitis identified on MRI of the brain placed on amoxicillin  Subjective/Complaints: Slept ok, cont of bladder Moving L 4 and 5 digits better Review of Systems - Negative except weakness on Left and as above  Objective: Vital Signs: Blood pressure 156/86, pulse 65, temperature 98.7 F (37.1 C), temperature source Oral, resp. rate 16, height 6\' 3"  (1.905 m), weight 118.389 kg (261 lb), SpO2 98.00%. No results found. Results for orders placed during the hospital encounter of 06/07/13 (from the past 72 hour(s))  GLUCOSE, CAPILLARY     Status: Abnormal   Collection Time    06/21/13  7:05 AM      Result Value Range   Glucose-Capillary 154 (*) 70 - 99 mg/dL   Comment 1 Notify RN    GLUCOSE, CAPILLARY     Status: None   Collection Time    06/21/13 12:05 PM      Result Value Range   Glucose-Capillary 70  70 - 99 mg/dL  GLUCOSE, CAPILLARY     Status: Abnormal   Collection Time    06/21/13  2:08 PM      Result Value Range   Glucose-Capillary 103 (*) 70 - 99 mg/dL  GLUCOSE, CAPILLARY     Status: None   Collection Time    06/21/13  4:51 PM      Result Value Range   Glucose-Capillary 87  70 - 99 mg/dL   Comment 1 Notify RN    GLUCOSE,  CAPILLARY     Status: Abnormal   Collection Time    06/21/13  8:49 PM      Result Value Range   Glucose-Capillary 162 (*) 70 - 99 mg/dL  GLUCOSE, CAPILLARY     Status: Abnormal   Collection Time    06/22/13  7:10 AM      Result Value Range   Glucose-Capillary 156 (*) 70 - 99 mg/dL   Comment 1 Notify RN    GLUCOSE, CAPILLARY     Status: Abnormal   Collection Time    06/22/13 11:28 AM      Result Value Range   Glucose-Capillary 105 (*) 70 - 99 mg/dL   Comment 1 Notify RN    GLUCOSE, CAPILLARY     Status: None   Collection Time    06/22/13  4:40 PM      Result Value Range   Glucose-Capillary 95  70 - 99 mg/dL   Comment 1 Notify RN    GLUCOSE, CAPILLARY     Status: Abnormal   Collection Time    06/22/13  8:39 PM      Result Value Range   Glucose-Capillary 129 (*) 70 - 99 mg/dL   Comment 1 Notify RN    GLUCOSE, CAPILLARY     Status: Abnormal   Collection Time    06/23/13  7:26 AM  Result Value Range   Glucose-Capillary 160 (*) 70 - 99 mg/dL  GLUCOSE, CAPILLARY     Status: None   Collection Time    06/23/13 11:56 AM      Result Value Range   Glucose-Capillary 82  70 - 99 mg/dL   Comment 1 Notify RN    GLUCOSE, CAPILLARY     Status: Abnormal   Collection Time    06/23/13  4:19 PM      Result Value Range   Glucose-Capillary 106 (*) 70 - 99 mg/dL   Comment 1 Notify RN    GLUCOSE, CAPILLARY     Status: None   Collection Time    06/23/13  9:29 PM      Result Value Range   Glucose-Capillary 83  70 - 99 mg/dL   Comment 1 Notify RN       HEENT: normal Cardio: RRR and no murmur Resp: CTA B/L and unlabored GI: BS positive and non distended Extremity:  Pulses positive and No Edema Skin:   Intact except R med hand abrasion, no drainage, left elbow normal, loop recorder incision healing well Neuro: Alert/Oriented, Cranial Nerve Abnormalities left central 7, Abnormal Sensory absent left hand and foot, Abnormal Motor 3- Left shoulder abd, add, Bi, Tri and Thumb ext,3-/5  finger #2 and 3ext 3- L Hip Add, 3-/5 hip flexion, 3-/5 KE, trace toe flexion, Dysarthric and Other poor lip closure on Left, very poor motor control on Left Musc/Skel:  Other Left shoulder sublux Gen NAD   Assessment/Plan: 1. Functional deficits secondary to R MCA embolic infarct  which require 3+ hours per day of interdisciplinary therapy in a comprehensive inpatient rehab setting. Physiatrist is providing close team supervision and 24 hour management of active medical problems listed below. Physiatrist and rehab team continue to assess barriers to discharge/monitor patient progress toward functional and medical goals.  FIM: FIM - Bathing Bathing Steps Patient Completed: Chest;Right Arm;Left Arm;Abdomen Bathing: 5: Supervision: Safety issues/verbal cues  FIM - Upper Body Dressing/Undressing Upper body dressing/undressing steps patient completed: Thread/unthread left sleeve of pullover shirt/dress;Put head through opening of pull over shirt/dress;Pull shirt over trunk;Thread/unthread right sleeve of pullover shirt/dresss Upper body dressing/undressing: 5: Set-up assist to: Obtain clothing/put away FIM - Lower Body Dressing/Undressing Lower body dressing/undressing steps patient completed: Don/Doff right shoe;Don/Doff left shoe;Fasten/unfasten right shoe;Fasten/unfasten left shoe Lower body dressing/undressing: 5: Set-up assist to: Don/Doff TED stocking  FIM - Toileting Toileting steps completed by patient: Performs perineal hygiene Toileting: 1: Two helpers  FIM - Radio producer Devices: Elevated toilet seat;Grab bars Toilet Transfers: 3-To toilet/BSC: Mod A (lift or lower assist);3-From toilet/BSC: Mod A (lift or lower assist)  FIM - Bed/Chair Transfer Bed/Chair Transfer Assistive Devices: HOB elevated Bed/Chair Transfer: 5: Supine > Sit: Supervision (verbal cues/safety issues);5: Sit > Supine: Supervision (verbal cues/safety issues);4: Bed > Chair or  W/C: Min A (steadying Pt. > 75%);4: Chair or W/C > Bed: Min A (steadying Pt. > 75%)  FIM - Locomotion: Wheelchair Distance: 150 Locomotion: Wheelchair: 6: Travels 150 ft or more, turns around, maneuvers to table, bed or toilet, negotiates 3% grade: maneuvers on rugs and over door sills independently FIM - Locomotion: Ambulation Locomotion: Ambulation Assistive Devices: Walker - Rolling;Orthosis;Other (comment) (wall rail) Ambulation/Gait Assistance: 3: Mod assist Locomotion: Ambulation: 2: Travels 50 - 149 ft with moderate assistance (Pt: 50 - 74%)  Comprehension Comprehension Mode: Auditory Comprehension: 6-Follows complex conversation/direction: With extra time/assistive device  Expression Expression Mode: Verbal Expression: 6-Expresses complex ideas: With  extra time/assistive device  Social Interaction Social Interaction: 6-Interacts appropriately with others with medication or extra time (anti-anxiety, antidepressant).  Problem Solving Problem Solving: 6-Solves complex problems: With extra time  Memory Memory: 6-Assistive device: No helper  Medical Problem List and Plan:  1. 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 Loop recorder implanted 06/07/13. On plavix 2. DVT Prophylaxis/Anticoagulation: SCDs. Monitor for any signs of DVT  3. Pain Management: Tylenol as needed for  4. Neuropsych: This patient is capable of making decisions on his own behalf.  5. Hypertension. Norvasc 10 mg daily, Coreg 12.5 mg twice a day,increase Catapres 0.3 mg tid, lisinopril 40 mg daily. Monitor with increased mobility, off HCTZ, trial cardura, increase dose 6. Diabetes mellitus with peripheral neuropathy.CBGs generally good but elevation this am Hemoglobin A1c 6.2. DiaBeta 5 mg daily, Glucophage 1500 mg daily. Check blood sugars a.c. and at bedtime. Education by team  Dietary ed for DM and weight loss 7. Sinusitis identified on MRI. , monitor off abx 8.  Hypo K, KCL, now off HCTZ expect improvement,  recheck BMET LOS (Days) 17 A FACE TO FACE EVALUATION WAS PERFORMED  Donald [name] E 06/24/2013, 6:08 AM

## 2013-06-24 NOTE — Progress Notes (Signed)
Occupational Therapy Session Note  Patient Details  Name: Donald [name] MRN: [id number] Date of Birth: [birth year]  Today's Date: 06/24/2013 Time: 4332-9518 and 8416-6063 Time Calculation (min): 55 min and 35 min  Short Term Goals: Week 3:  OT Short Term Goal 1 (Week 3): Pt will complete toilet transfer with min assist 5/5 opportunities OT Short Term Goal 2 (Week 3): Pt will complete transfer to tub bench with min assist of one caregiver with fewer than 25% cues for safety and sequencing OT Short Term Goal 3 (Week 3): Pt will complete toileting (hygiene and clothing management) with min assist OT Short Term Goal 4 (Week 3): Pt will complete LB dressing with min assist OT Short Term Goal 5 (Week 3): Pt will utilize LUE as gross assist to complete grooming tasks.  Skilled Therapeutic Interventions/Progress Updates:    1) Pt seen for ADL retraining with focus on toilet transfer, tub bench transfer, and bathing at shower level.  Pt completed bathing and dressing in ADL tub room, upon arrival pt reports need to have BM.  Performed stand pivot to toilet with mod assist secondary to poor LLE placement and impulsivity with transfer.  Pt continues to require mod verbal cues for attention to Lt side during mobility. Pt required assist to pull up underwear on Lt due to decreased sensation.  Squat pivot w/c to tub bench with pt with increased carryover from prior transfer, continuing to require mod cues for sequencing and manual facilitation at Lt knee to ensure proper positioning.  LB dressing sit > stand with physical assist to maintain midline standing by providing blocking at Lt knee into proper WB position and assist to pull up pants on Lt.  2) Pt seen for 1:1 OT with focus on LUE NM re-ed in sitting.  Pt performed squat pivot transfer to therapy mat with min assist with mod cues for Lt sided awareness with increased positioning prior to transfer.  Utilized UE Ranger with focus on wrist  flexion/extension, supination/pronation, and shoulder flexion/extension.  Pt required visual targets to promote straight pathway with shoulder flexion/extension as he tends to have decreased grading and motor control with movement.  Therapy Documentation Precautions:  Precautions Precautions: Fall Precaution Comments: LUE and LLE hemiparesis Restrictions Weight Bearing Restrictions: No Pain:   Pt with no c/o pain  See FIM for current functional status  Therapy/Group: Individual Therapy  Simonne Come 06/24/2013, 9:41 AM

## 2013-06-25 ENCOUNTER — Inpatient Hospital Stay (HOSPITAL_COMMUNITY): Payer: Medicare Other

## 2013-06-25 DIAGNOSIS — I69991 Dysphagia following unspecified cerebrovascular disease: Secondary | ICD-10-CM

## 2013-06-25 DIAGNOSIS — I6340 Cerebral infarction due to embolism of unspecified cerebral artery: Secondary | ICD-10-CM

## 2013-06-25 LAB — GLUCOSE, CAPILLARY
Glucose-Capillary: 136 mg/dL — ABNORMAL HIGH (ref 70–99)
Glucose-Capillary: 116 mg/dL — ABNORMAL HIGH (ref 70–99)
Glucose-Capillary: 173 mg/dL — ABNORMAL HIGH (ref 70–99)
Glucose-Capillary: 75 mg/dL (ref 70–99)

## 2013-06-25 MED ORDER — DOXAZOSIN MESYLATE 4 MG PO TABS
4.0000 mg | ORAL_TABLET | Freq: Every day | ORAL | Status: DC
Start: 2013-06-25 — End: 2013-06-29
  Administered 2013-06-25 – 2013-06-28 (×4): 4 mg via ORAL
  Filled 2013-06-25 (×6): qty 1

## 2013-06-25 NOTE — Progress Notes (Signed)
68 y.o. right-handed male with history of hypertension as well as diabetes mellitus. Patient is from [place] and was visiting family in [place]. Admitted 06/03/2013 of left-sided weakness and slurred speech. 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 Echocardiogram with ejection fraction 83% grade 1 diastolic dysfunction. CT angiogram head and neck with no proximal branch occlusion or stenosis. Patient did receive TPA. Neurology services consulted placed on Plavix for CVA prophylaxis. TEE showed no PFO or thrombus loop recorder just implanted to rule out arrhythmia as potential cause. 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 Felt that it was due to him being over-tired. Feels better today. Noticed his urine output was less Review of Systems - otherwise Negative except weakness on Left and as above  Objective: Vital Signs: Blood pressure 178/92, pulse 57, temperature 97.6 F (36.4 C), temperature source Oral, resp. rate 18, height 6' 3"  (1.905 m), weight 118.389 kg (261 lb), SpO2 99.00%. No results found. Results for orders placed during the hospital encounter of 06/07/13 (from the past 72 hour(s))  GLUCOSE, CAPILLARY     Status: Abnormal   Collection Time    06/22/13 11:28 AM      Result Value Range   Glucose-Capillary 105 (*) 70 - 99 mg/dL   Comment 1 Notify RN    GLUCOSE, CAPILLARY     Status: None   Collection Time    06/22/13  4:40 PM      Result Value Range   Glucose-Capillary 95  70 - 99 mg/dL   Comment 1 Notify RN    GLUCOSE, CAPILLARY     Status: Abnormal   Collection Time    06/22/13  8:39 PM      Result Value Range   Glucose-Capillary 129 (*) 70 - 99 mg/dL   Comment 1 Notify RN    GLUCOSE, CAPILLARY      Status: Abnormal   Collection Time    06/23/13  7:26 AM      Result Value Range   Glucose-Capillary 160 (*) 70 - 99 mg/dL  GLUCOSE, CAPILLARY     Status: None   Collection Time    06/23/13 11:56 AM      Result Value Range   Glucose-Capillary 82  70 - 99 mg/dL   Comment 1 Notify RN    GLUCOSE, CAPILLARY     Status: Abnormal   Collection Time    06/23/13  4:19 PM      Result Value Range   Glucose-Capillary 106 (*) 70 - 99 mg/dL   Comment 1 Notify RN    GLUCOSE, CAPILLARY     Status: None   Collection Time    06/23/13  9:29 PM      Result Value Range   Glucose-Capillary 83  70 - 99 mg/dL   Comment 1 Notify RN    BASIC METABOLIC PANEL     Status: Abnormal   Collection Time    06/24/13  5:44 AM      Result Value Range   Sodium 142  137 - 147 mEq/L   Potassium 3.7  3.7 - 5.3 mEq/L   Chloride 104  96 - 112 mEq/L   CO2 24  19 - 32 mEq/L   Glucose, Bld 124 (*) 70 - 99 mg/dL   BUN 13  6 - 23 mg/dL   Creatinine, Ser 0.80  0.50 - 1.35 mg/dL   Calcium 9.2  8.4 - 10.5 mg/dL   GFR calc non Af Amer >90  >90 mL/min   GFR calc Af Amer >90  >90 mL/min   Comment: (NOTE)     The eGFR has been calculated using the CKD EPI equation.     This calculation has not been validated in all clinical situations.     eGFR's persistently <90 mL/min signify possible Chronic Kidney     Disease.  GLUCOSE, CAPILLARY     Status: Abnormal   Collection Time    06/24/13  7:01 AM      Result Value Range   Glucose-Capillary 145 (*) 70 - 99 mg/dL   Comment 1 Notify RN    GLUCOSE, CAPILLARY     Status: Abnormal   Collection Time    06/24/13 11:48 AM      Result Value Range   Glucose-Capillary 111 (*) 70 - 99 mg/dL   Comment 1 Notify RN    GLUCOSE, CAPILLARY     Status: Abnormal   Collection Time    06/24/13  1:40 PM      Result Value Range   Glucose-Capillary 100 (*) 70 - 99 mg/dL  GLUCOSE, CAPILLARY     Status: Abnormal   Collection Time    06/24/13  4:56 PM      Result Value Range    Glucose-Capillary 135 (*) 70 - 99 mg/dL   Comment 1 Notify RN    GLUCOSE, CAPILLARY     Status: Abnormal   Collection Time    06/24/13  8:48 PM      Result Value Range   Glucose-Capillary 163 (*) 70 - 99 mg/dL  GLUCOSE, CAPILLARY     Status: Abnormal   Collection Time    06/25/13  7:13 AM      Result Value Range   Glucose-Capillary 136 (*) 70 - 99 mg/dL   Comment 1 Notify RN       HEENT: normal Cardio: RRR and no murmur Resp: CTA B/L and unlabored GI: BS positive and non distended Extremity:  Pulses positive and No Edema Skin:   Intact except R med hand abrasion, no drainage, left elbow normal, loop recorder incision healing well Neuro: Alert/Oriented, Cranial Nerve Abnormalities left central 7, Abnormal Sensory absent left hand and foot, Abnormal Motor 3- Left shoulder abd, add, Bi, Tri and Thumb ext,3-/5 finger #2 and 3ext 3- L Hip Add, 3-/5 hip flexion, 3-/5 KE, trace toe flexion, Dysarthric and Other poor lip closure on Left, very poor motor control on Left Musc/Skel:  Other Left shoulder sublux Gen NAD   Assessment/Plan: 1. Functional deficits secondary to R MCA embolic infarct  which require 3+ hours per day of interdisciplinary therapy in a comprehensive inpatient rehab setting. Physiatrist is providing close team supervision and 24 hour management of active medical problems listed below. Physiatrist and rehab team continue to assess barriers to discharge/monitor patient progress toward functional and medical goals.  FIM: FIM - Bathing Bathing Steps Patient Completed: Chest;Right Arm;Left Arm;Abdomen;Front perineal area;Buttocks;Right upper leg;Left upper leg;Right lower leg (including foot);Left lower leg (including foot) Bathing: 4: Steadying assist  FIM - Upper Body Dressing/Undressing Upper body dressing/undressing steps patient completed: Put head through opening of pull over shirt/dress;Pull shirt over trunk;Thread/unthread right sleeve of pullover  shirt/dresss Upper body dressing/undressing: 4: Min-Patient completed 75 plus % of tasks FIM - Lower Body Dressing/Undressing Lower body dressing/undressing steps  patient completed: Thread/unthread right underwear leg;Thread/unthread left underwear leg;Thread/unthread right pants leg;Thread/unthread left pants leg;Don/Doff right shoe;Don/Doff left shoe;Fasten/unfasten right shoe;Fasten/unfasten left shoe Lower body dressing/undressing: 4: Min-Patient completed 75 plus % of tasks  FIM - Toileting Toileting steps completed by patient: Adjust clothing prior to toileting;Performs perineal hygiene Toileting Assistive Devices: Grab bar or rail for support Toileting: 3: Mod-Patient completed 2 of 3 steps  FIM - Radio producer Devices: Elevated toilet seat;Grab bars Toilet Transfers: 3-To toilet/BSC: Mod A (lift or lower assist);3-From toilet/BSC: Mod A (lift or lower assist)  FIM - Engineer, site Assistive Devices: HOB elevated Bed/Chair Transfer: 4: Chair or W/C > Bed: Min A (steadying Pt. > 75%);4: Bed > Chair or W/C: Min A (steadying Pt. > 75%)  FIM - Locomotion: Wheelchair Distance: 150 Locomotion: Wheelchair: 6: Travels 150 ft or more, turns around, maneuvers to table, bed or toilet, negotiates 3% grade: maneuvers on rugs and over door sills independently FIM - Locomotion: Ambulation Locomotion: Ambulation Assistive Devices: Orthosis;Other (comment) (wall rail) Ambulation/Gait Assistance: 3: Mod assist Locomotion: Ambulation: 1: Travels less than 50 ft with moderate assistance (Pt: 50 - 74%)  Comprehension Comprehension Mode: Auditory Comprehension: 6-Follows complex conversation/direction: With extra time/assistive device  Expression Expression Mode: Verbal Expression: 6-Expresses complex ideas: With extra time/assistive device  Social Interaction Social Interaction: 6-Interacts appropriately with others with medication or extra  time (anti-anxiety, antidepressant).  Problem Solving Problem Solving: 6-Solves complex problems: With extra time  Memory Memory: 6-More than reasonable amt of time  Medical Problem List and Plan:  1. 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 Loop recorder implanted 06/07/13. On plavix 2. DVT Prophylaxis/Anticoagulation: SCDs. Monitor for any signs of DVT  3. Pain Management: Tylenol as needed for  4. Neuropsych: This patient is capable of making decisions on his own behalf.  5. Hypertension. Norvasc 10 mg daily, Coreg 12.5 mg twice a day,increase Catapres 0.3 mg tid, lisinopril 40 mg daily. Monitor with increased mobility, off HCTZ, increase cardura to 76m 6. Diabetes mellitus with peripheral neuropathy.CBGs generally good but elevation this am Hemoglobin A1c 6.2. DiaBeta 5 mg daily, Glucophage 1500 mg daily.  Sugars reasonable 7. Sinusitis identified on MRI. , monitor off abx 8.  Hypo K, KCL, now off HCTZ expect improvement, BMET better yesterday LOS (Days) 18 A FACE TO FACE EVALUATION WAS PERFORMED  Nekeshia Lenhardt T 06/25/2013, 8:49 AM

## 2013-06-25 NOTE — Progress Notes (Signed)
Physical Therapy Note  Patient Details  Name: Donald [name] MRN: [id number] Date of Birth: 07-31-1945 Today's Date: 06/25/2013  1035-1120 45 min individual therapy  No pain reported  Tx focused on neuromuscular re-education LLE via demo, visual feedback, VCS for L stance stability with alignment of L foot under knee in sitting, static and dynamic standing, and L stance stability in standing and gait.  Pt tends to sit and stand in hip bil ER, abducted hips, narrow BOS for his size.  L AFO [place] during session.  Kinetron in sitting working on L hip/knee/ankle alignment, x 8 minutes at 40> 30 cm/seconds.  bil hip adduction in sitting, 10 x 1 against resistance.  Gait x 40' including turn to R and L, RW, mod assist, focusing on L stance stability, symmetrical pelvis.  W/c propulsion x 150' x 1 modified independent, using hemi technique.  Dreux Mcgroarty 06/25/2013, 7:43 AM

## 2013-06-26 ENCOUNTER — Encounter (HOSPITAL_COMMUNITY): Payer: Medicare Other

## 2013-06-26 LAB — GLUCOSE, CAPILLARY
Glucose-Capillary: 132 mg/dL — ABNORMAL HIGH (ref 70–99)
Glucose-Capillary: 117 mg/dL — ABNORMAL HIGH (ref 70–99)
Glucose-Capillary: 117 mg/dL — ABNORMAL HIGH (ref 70–99)
Glucose-Capillary: 83 mg/dL (ref 70–99)

## 2013-06-26 NOTE — Progress Notes (Signed)
68 y.o. right-handed male with history of hypertension as well as diabetes mellitus. Patient is from [place] and was visiting family in [place]. Admitted 06/03/2013 of left-sided weakness and slurred speech. 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 Echocardiogram with ejection fraction 64% grade 1 diastolic dysfunction. CT angiogram head and neck with no proximal branch occlusion or stenosis. Patient did receive TPA. Neurology services consulted placed on Plavix for CVA prophylaxis. TEE showed no PFO or thrombus loop recorder just implanted to rule out arrhythmia as potential cause. Acute on chronic paranasal sinusitis identified on MRI of the brain placed on amoxicillin  Subjective/Complaints: Overall feeling well. Had a good day yesterday.  Review of Systems - otherwise Negative except weakness on Left and as above  Objective: Vital Signs: Blood pressure 194/91, pulse 56, temperature 97.7 F (36.5 C), temperature source Oral, resp. rate 19, height 6' 3"  (1.905 m), weight 118.389 kg (261 lb), SpO2 97.00%. No results found. Results for orders placed during the hospital encounter of 06/07/13 (from the past 72 hour(s))  GLUCOSE, CAPILLARY     Status: None   Collection Time    06/23/13 11:56 AM      Result Value Range   Glucose-Capillary 82  70 - 99 mg/dL   Comment 1 Notify RN    GLUCOSE, CAPILLARY     Status: Abnormal   Collection Time    06/23/13  4:19 PM      Result Value Range   Glucose-Capillary 106 (*) 70 - 99 mg/dL   Comment 1 Notify RN    GLUCOSE, CAPILLARY     Status: None   Collection Time    06/23/13  9:29 PM      Result Value Range   Glucose-Capillary 83  70 - 99 mg/dL   Comment 1 Notify RN    BASIC METABOLIC PANEL     Status: Abnormal   Collection Time    06/24/13  5:44 AM      Result Value Range   Sodium 142  137 - 147  mEq/L   Potassium 3.7  3.7 - 5.3 mEq/L   Chloride 104  96 - 112 mEq/L   CO2 24  19 - 32 mEq/L   Glucose, Bld 124 (*) 70 - 99 mg/dL   BUN 13  6 - 23 mg/dL   Creatinine, Ser 0.80  0.50 - 1.35 mg/dL   Calcium 9.2  8.4 - 10.5 mg/dL   GFR calc non Af Amer >90  >90 mL/min   GFR calc Af Amer >90  >90 mL/min   Comment: (NOTE)     The eGFR has been calculated using the CKD EPI equation.     This calculation has not been validated in all clinical situations.     eGFR's persistently <90 mL/min signify possible Chronic Kidney     Disease.  GLUCOSE, CAPILLARY     Status: Abnormal   Collection Time    06/24/13  7:01 AM      Result Value Range   Glucose-Capillary 145 (*) 70 - 99 mg/dL   Comment 1 Notify RN    GLUCOSE, CAPILLARY     Status: Abnormal   Collection Time    06/24/13 11:48 AM      Result Value Range   Glucose-Capillary 111 (*) 70 - 99 mg/dL   Comment 1 Notify RN    GLUCOSE, CAPILLARY  Status: Abnormal   Collection Time    06/24/13  1:40 PM      Result Value Range   Glucose-Capillary 100 (*) 70 - 99 mg/dL  GLUCOSE, CAPILLARY     Status: Abnormal   Collection Time    06/24/13  4:56 PM      Result Value Range   Glucose-Capillary 135 (*) 70 - 99 mg/dL   Comment 1 Notify RN    GLUCOSE, CAPILLARY     Status: Abnormal   Collection Time    06/24/13  8:48 PM      Result Value Range   Glucose-Capillary 163 (*) 70 - 99 mg/dL  GLUCOSE, CAPILLARY     Status: Abnormal   Collection Time    06/25/13  7:13 AM      Result Value Range   Glucose-Capillary 136 (*) 70 - 99 mg/dL   Comment 1 Notify RN    GLUCOSE, CAPILLARY     Status: Abnormal   Collection Time    06/25/13 12:10 PM      Result Value Range   Glucose-Capillary 116 (*) 70 - 99 mg/dL   Comment 1 Documented in Chart    GLUCOSE, CAPILLARY     Status: Abnormal   Collection Time    06/25/13  4:05 PM      Result Value Range   Glucose-Capillary 173 (*) 70 - 99 mg/dL   Comment 1 Notify RN    GLUCOSE, CAPILLARY     Status:  None   Collection Time    06/25/13  8:47 PM      Result Value Range   Glucose-Capillary 75  70 - 99 mg/dL  GLUCOSE, CAPILLARY     Status: Abnormal   Collection Time    06/26/13  7:11 AM      Result Value Range   Glucose-Capillary 132 (*) 70 - 99 mg/dL     HEENT: normal Cardio: RRR and no murmur Resp: CTA B/L and unlabored GI: BS positive and non distended Extremity:  Pulses positive and No Edema Skin:   Intact except R med hand abrasion, no drainage, left elbow normal, loop recorder incision healing well Neuro: Alert/Oriented, Cranial Nerve Abnormalities left central 7, Abnormal Sensory absent left hand and foot, Abnormal Motor 3- Left shoulder abd, add, Bi, Tri and Thumb ext,3-/5 finger #2 and 3ext 3- L Hip Add, 3-/5 hip flexion, 3-/5 KE, trace toe flexion, Dysarthric and Other poor lip closure on Left, very poor motor control on Left Musc/Skel:  Other Left shoulder sublux Gen NAD   Assessment/Plan: 1. Functional deficits secondary to R MCA embolic infarct  which require 3+ hours per day of interdisciplinary therapy in a comprehensive inpatient rehab setting. Physiatrist is providing close team supervision and 24 hour management of active medical problems listed below. Physiatrist and rehab team continue to assess barriers to discharge/monitor patient progress toward functional and medical goals.  FIM: FIM - Bathing Bathing Steps Patient Completed: Chest;Right Arm;Left Arm;Abdomen;Front perineal area;Buttocks;Right upper leg;Left upper leg;Right lower leg (including foot);Left lower leg (including foot) Bathing: 4: Steadying assist  FIM - Upper Body Dressing/Undressing Upper body dressing/undressing steps patient completed: Put head through opening of pull over shirt/dress;Pull shirt over trunk;Thread/unthread right sleeve of pullover shirt/dresss Upper body dressing/undressing: 4: Min-Patient completed 75 plus % of tasks FIM - Lower Body Dressing/Undressing Lower body  dressing/undressing steps patient completed: Thread/unthread right underwear leg;Thread/unthread left underwear leg;Thread/unthread right pants leg;Thread/unthread left pants leg;Don/Doff right shoe;Don/Doff left shoe;Fasten/unfasten right shoe;Fasten/unfasten left shoe Lower body  dressing/undressing: 4: Min-Patient completed 75 plus % of tasks  FIM - Toileting Toileting steps completed by patient: Performs perineal hygiene Toileting Assistive Devices: Grab bar or rail for support Toileting: 3: Mod-Patient completed 2 of 3 steps  FIM - Radio producer Devices: Elevated toilet seat;Grab bars Toilet Transfers: 3-To toilet/BSC: Mod A (lift or lower assist);3-From toilet/BSC: Mod A (lift or lower assist)  FIM - Bed/Chair Transfer Bed/Chair Transfer Assistive Devices: HOB elevated Bed/Chair Transfer: 3: Bed > Chair or W/C: Mod A (lift or lower assist);3: Chair or W/C > Bed: Mod A (lift or lower assist)  FIM - Locomotion: Wheelchair Distance: 150 Locomotion: Wheelchair: 6: Travels 150 ft or more, turns around, maneuvers to table, bed or toilet, negotiates 3% grade: maneuvers on rugs and over door sills independently FIM - Locomotion: Ambulation Locomotion: Ambulation Assistive Devices: Orthosis;Other (comment) Ambulation/Gait Assistance: 3: Mod assist Locomotion: Ambulation: 1: Travels less than 50 ft with moderate assistance (Pt: 50 - 74%)  Comprehension Comprehension Mode: Auditory Comprehension: 6-Follows complex conversation/direction: With extra time/assistive device  Expression Expression Mode: Verbal Expression: 6-Expresses complex ideas: With extra time/assistive device  Social Interaction Social Interaction: 6-Interacts appropriately with others with medication or extra time (anti-anxiety, antidepressant).  Problem Solving Problem Solving: 6-Solves complex problems: With extra time  Memory Memory: 6-More than reasonable amt of time  Medical  Problem List and Plan:  1. 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 Loop recorder implanted 06/07/13. On plavix 2. DVT Prophylaxis/Anticoagulation: SCDs. Monitor for any signs of DVT  3. Pain Management: Tylenol as needed for  4. Neuropsych: This patient is capable of making decisions on his own behalf.  5. Hypertension. Norvasc 10 mg daily, Coreg 12.5 mg twice a day,increase Catapres 0.3 mg tid, lisinopril 40 mg daily. Monitor with increased mobility, off HCTZ, increased cardura to 74m last night. May need further titration  -bp's tend to be highest in the morning 6. Diabetes mellitus with peripheral neuropathy.CBGs generally good but elevation this am Hemoglobin A1c 6.2. DiaBeta 5 mg daily, Glucophage 1500 mg daily.  Sugars reasonable 7. Sinusitis identified on MRI. , monitor off abx 8.  Hypo K, KCL, now off HCTZ expect improvement, BMET better friday LOS (Days) 19 A FACE TO FACE EVALUATION WAS PERFORMED  SWARTZ,ZACHARY T 06/26/2013, 8:11 AM

## 2013-06-26 NOTE — Progress Notes (Signed)
Occupational Therapy Session Note  Patient Details  Name: Donald [name] MRN: [id number] Date of Birth: Mar 16, 1946  Today's Date: 06/26/2013 Time: 1000-1100 Time Calculation (min): 60 min  Short Term Goals: Week 3:  OT Short Term Goal 1 (Week 3): Pt will complete toilet transfer with min assist 5/5 opportunities OT Short Term Goal 2 (Week 3): Pt will complete transfer to tub bench with min assist of one caregiver with fewer than 25% cues for safety and sequencing OT Short Term Goal 3 (Week 3): Pt will complete toileting (hygiene and clothing management) with min assist OT Short Term Goal 4 (Week 3): Pt will complete LB dressing with min assist OT Short Term Goal 5 (Week 3): Pt will utilize LUE as gross assist to complete grooming tasks.  Skilled Therapeutic Interventions/Progress Updates:    Pt resting in bed upon arrival and agreeable to ADL retraining including bathing at shower level and dressing with sit<>stand from w/c at sink.  Pt required max verbal cues to attend to LUE when not engaged in functional task.  Pt initiated use of LUE for all bathing and dressing tasks.  Pt initially threaded BLE into same pants leg but was unaware of error even after questioning cues to correct.  Pt required min A and max verbal cues for donning shirt this morning.  Pt required mod A and max verbal cues for weight shifts and standing balance when standing to pull up pants.  Focus on activity tolerance, transfers, sit<>stand, dynamic standing balance, safety awareness, weight shifts, and attention to left.  Therapy Documentation Precautions:  Precautions Precautions: Fall Precaution Comments: LUE and LLE hemiparesis Restrictions Weight Bearing Restrictions: No   Vital Signs: Therapy Vitals Pulse Rate: 56 BP: 194/91 mmHg Patient Position, if appropriate: Lying Pain: Pain Assessment Pain Assessment: No/denies pain  See FIM for current functional status  Therapy/Group: Individual  Therapy  Donald, [name] 06/26/2013, 11:08 AM

## 2013-06-27 ENCOUNTER — Inpatient Hospital Stay (HOSPITAL_COMMUNITY): Payer: Medicare Other | Admitting: Physical Therapy

## 2013-06-27 ENCOUNTER — Inpatient Hospital Stay (HOSPITAL_COMMUNITY): Payer: Medicare Other | Admitting: Occupational Therapy

## 2013-06-27 DIAGNOSIS — I6340 Cerebral infarction due to embolism of unspecified cerebral artery: Secondary | ICD-10-CM

## 2013-06-27 DIAGNOSIS — I69991 Dysphagia following unspecified cerebrovascular disease: Secondary | ICD-10-CM

## 2013-06-27 DIAGNOSIS — G8110 Spastic hemiplegia affecting unspecified side: Secondary | ICD-10-CM

## 2013-06-27 LAB — GLUCOSE, CAPILLARY
Glucose-Capillary: 153 mg/dL — ABNORMAL HIGH (ref 70–99)
Glucose-Capillary: 74 mg/dL (ref 70–99)
Glucose-Capillary: 90 mg/dL (ref 70–99)
Glucose-Capillary: 132 mg/dL — ABNORMAL HIGH (ref 70–99)

## 2013-06-27 MED ORDER — HYDROCHLOROTHIAZIDE 10 MG/ML ORAL SUSPENSION
6.2500 mg | Freq: Every day | ORAL | Status: DC
  Administered 2013-06-27: 6.25 mg via ORAL
  Filled 2013-06-27 (×3): qty 1.25

## 2013-06-27 NOTE — Progress Notes (Signed)
68 y.o. right-handed male with history of hypertension as well as diabetes mellitus. Patient is from [place] and was visiting family in [place]. Admitted 06/03/2013 of left-sided weakness and slurred speech. 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 Echocardiogram with ejection fraction 65% grade 1 diastolic dysfunction. CT angiogram head and neck with no proximal branch occlusion or stenosis. Patient did receive TPA. Neurology services consulted placed on Plavix for CVA prophylaxis. TEE showed no PFO or thrombus loop recorder just implanted to rule out arrhythmia as potential cause. Acute on chronic paranasal sinusitis identified on MRI of the brain placed on amoxicillin  Subjective/Complaints: Biggest concern is ambulation, washed himself with Left hand yesterday Review of Systems - Negative except weakness on Left and as above  Objective: Vital Signs: Blood pressure 176/93, pulse 60, temperature 96.8 F (36 C), temperature source Oral, resp. rate 18, height 6\' 3"  (1.905 m), weight 118.389 kg (261 lb), SpO2 95.00%. No results found. Results for orders placed during the hospital encounter of 06/07/13 (from the past 72 hour(s))  GLUCOSE, CAPILLARY     Status: Abnormal   Collection Time    06/24/13  7:01 AM      Result Value Range   Glucose-Capillary 145 (*) 70 - 99 mg/dL   Comment 1 Notify RN    GLUCOSE, CAPILLARY     Status: Abnormal   Collection Time    06/24/13 11:48 AM      Result Value Range   Glucose-Capillary 111 (*) 70 - 99 mg/dL   Comment 1 Notify RN    GLUCOSE, CAPILLARY     Status: Abnormal   Collection Time    06/24/13  1:40 PM      Result Value Range   Glucose-Capillary 100 (*) 70 - 99 mg/dL  GLUCOSE, CAPILLARY     Status: Abnormal   Collection Time    06/24/13  4:56 PM      Result Value Range   Glucose-Capillary 135 (*)  70 - 99 mg/dL   Comment 1 Notify RN    GLUCOSE, CAPILLARY     Status: Abnormal   Collection Time    06/24/13  8:48 PM      Result Value Range   Glucose-Capillary 163 (*) 70 - 99 mg/dL  GLUCOSE, CAPILLARY     Status: Abnormal   Collection Time    06/25/13  7:13 AM      Result Value Range   Glucose-Capillary 136 (*) 70 - 99 mg/dL   Comment 1 Notify RN    GLUCOSE, CAPILLARY     Status: Abnormal   Collection Time    06/25/13 12:10 PM      Result Value Range   Glucose-Capillary 116 (*) 70 - 99 mg/dL   Comment 1 Documented in Chart    GLUCOSE, CAPILLARY     Status: Abnormal   Collection Time    06/25/13  4:05 PM      Result Value Range   Glucose-Capillary 173 (*) 70 - 99 mg/dL   Comment 1 Notify RN    GLUCOSE, CAPILLARY     Status: None   Collection Time    06/25/13  8:47 PM      Result Value Range   Glucose-Capillary 75  70 - 99 mg/dL  GLUCOSE, CAPILLARY     Status: Abnormal   Collection Time    06/26/13  7:11 AM  Result Value Range   Glucose-Capillary 132 (*) 70 - 99 mg/dL  GLUCOSE, CAPILLARY     Status: Abnormal   Collection Time    06/26/13 11:40 AM      Result Value Range   Glucose-Capillary 117 (*) 70 - 99 mg/dL   Comment 1 Notify RN    GLUCOSE, CAPILLARY     Status: Abnormal   Collection Time    06/26/13  4:01 PM      Result Value Range   Glucose-Capillary 117 (*) 70 - 99 mg/dL   Comment 1 Notify RN    GLUCOSE, CAPILLARY     Status: None   Collection Time    06/26/13  9:13 PM      Result Value Range   Glucose-Capillary 83  70 - 99 mg/dL     HEENT: normal Cardio: RRR and no murmur Resp: CTA B/L and unlabored GI: BS positive and non distended Extremity:  Pulses positive and No Edema Skin:   Intact except R med hand abrasion, no drainage, left elbow normal, loop recorder incision healing well Neuro: Alert/Oriented, Cranial Nerve Abnormalities left central 7, Abnormal Sensory absent left hand and foot, Abnormal Motor 3- Left shoulder abd, add, Bi, Tri and  Thumb ext,3-/5 finger #2 and 3ext 3- L Hip Add, 3-/5 hip flexion, 3-/5 KE, trace toe flexion, Dysarthric and Other poor lip closure on Left, very poor motor control on Left Musc/Skel:  Other Left shoulder sublux Gen NAD   Assessment/Plan: 1. Functional deficits secondary to R MCA embolic infarct  which require 3+ hours per day of interdisciplinary therapy in a comprehensive inpatient rehab setting. Physiatrist is providing close team supervision and 24 hour management of active medical problems listed below. Physiatrist and rehab team continue to assess barriers to discharge/monitor patient progress toward functional and medical goals.  FIM: FIM - Bathing Bathing Steps Patient Completed: Chest;Right Arm;Left Arm;Abdomen;Front perineal area;Buttocks;Right upper leg;Left upper leg;Right lower leg (including foot);Left lower leg (including foot) Bathing: 4: Steadying assist  FIM - Upper Body Dressing/Undressing Upper body dressing/undressing steps patient completed: Put head through opening of pull over shirt/dress;Pull shirt over trunk;Thread/unthread right sleeve of front closure shirt/dress Upper body dressing/undressing: 4: Min-Patient completed 75 plus % of tasks FIM - Lower Body Dressing/Undressing Lower body dressing/undressing steps patient completed: Thread/unthread right underwear leg;Thread/unthread left underwear leg;Thread/unthread right pants leg;Thread/unthread left pants leg;Don/Doff right shoe;Don/Doff left shoe;Fasten/unfasten right shoe;Fasten/unfasten left shoe Lower body dressing/undressing: 4: Min-Patient completed 75 plus % of tasks  FIM - Toileting Toileting steps completed by patient: Performs perineal hygiene Toileting Assistive Devices: Grab bar or rail for support Toileting: 3: Mod-Patient completed 2 of 3 steps  FIM - Radio producer Devices: Elevated toilet seat;Grab bars Toilet Transfers: 3-To toilet/BSC: Mod A (lift or lower  assist);3-From toilet/BSC: Mod A (lift or lower assist)  FIM - Bed/Chair Transfer Bed/Chair Transfer Assistive Devices: HOB elevated Bed/Chair Transfer: 3: Bed > Chair or W/C: Mod A (lift or lower assist);3: Chair or W/C > Bed: Mod A (lift or lower assist)  FIM - Locomotion: Wheelchair Distance: 150 Locomotion: Wheelchair: 6: Travels 150 ft or more, turns around, maneuvers to table, bed or toilet, negotiates 3% grade: maneuvers on rugs and over door sills independently FIM - Locomotion: Ambulation Locomotion: Ambulation Assistive Devices: Orthosis;Other (comment) Ambulation/Gait Assistance: 3: Mod assist Locomotion: Ambulation: 1: Travels less than 50 ft with moderate assistance (Pt: 50 - 74%)  Comprehension Comprehension Mode: Auditory Comprehension: 6-Follows complex conversation/direction: With extra time/assistive device  Expression Expression Mode: Verbal Expression: 6-Expresses complex ideas: With extra time/assistive device  Social Interaction Social Interaction: 6-Interacts appropriately with others with medication or extra time (anti-anxiety, antidepressant).  Problem Solving Problem Solving: 6-Solves complex problems: With extra time  Memory Memory: 6-More than reasonable amt of time  Medical Problem List and Plan:  1. 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 Loop recorder implanted 06/07/13. On plavix 2. DVT Prophylaxis/Anticoagulation: SCDs. Monitor for any signs of DVT  3. Pain Management: Tylenol as needed for  4. Neuropsych: This patient is capable of making decisions on his own behalf.  5. Hypertension. Norvasc 10 mg daily, Coreg 12.5 mg twice a day,increase Catapres 0.3 mg tid, lisinopril 40 mg daily. Monitor with increased mobility, off HCTZ, trial cardura, increase dose,  add HCTZ, monitor K 6. Diabetes mellitus with peripheral neuropathy.CBGs generally good but elevation this am Hemoglobin A1c 6.2. DiaBeta 5 mg daily, Glucophage 1500 mg daily. Check blood sugars  a.c. and at bedtime. Education by team  Dietary ed for DM and weight loss 7. Sinusitis identified on MRI. , monitor off abx  LOS (Days) 20 A FACE TO FACE EVALUATION WAS PERFORMED  Marisabel Macpherson E 06/27/2013, 6:29 AM

## 2013-06-27 NOTE — Progress Notes (Signed)
Physical Therapy Session Note  Patient Details  Name: Donald [name] MRN: [id number] Date of Birth: 04-22-1946  Today's Date: 06/27/2013 Time: 1100-1200 Time Calculation (min): 60 min  Skilled Therapeutic Interventions/Progress Updates:  1:1. Pt received sitting in w/c, ready for therapy. Mod(I) for w/c propulsion using R UE/LE throughout tx session, consistently >200'. Focus on NMR w/ t/f sit<>stand and minisquats w/ use of rail in hall and emphasis on increased WB through L LE, encouraged pt to WB 60% through L LE and 40% through R LE w/ tactile cueing. Improved eccentric control and more even WB through B LE noted. Practice t/f sit<>stand w/ RW and weight shift to L side while reaching back w/ R UE for improved control. Simulated car t/f w/ elevated tx mat, req min-mod A overall w/ mod cueing for safety during SPT w/c to car seat. Pt sitting in w/c at end of session w/ all needs in reach.   Therapy Documentation Precautions:  Precautions Precautions: Fall Precaution Comments: LUE and LLE hemiparesis Restrictions Weight Bearing Restrictions: No  See FIM for current functional status  Therapy/Group: Individual Therapy  Gilmore Laroche 06/27/2013, 12:11 PM

## 2013-06-27 NOTE — Progress Notes (Signed)
Occupational Therapy Session Note  Patient Details  Name: Donald [name] MRN: [id number] Date of Birth: 1945/09/20  Today's Date: 06/27/2013 Time: 0815-0912 and 1400-1430 Time Calculation (min): 57 min and 30 min  Short Term Goals: Week 3:  OT Short Term Goal 1 (Week 3): Pt will complete toilet transfer with min assist 5/5 opportunities OT Short Term Goal 2 (Week 3): Pt will complete transfer to tub bench with min assist of one caregiver with fewer than 25% cues for safety and sequencing OT Short Term Goal 3 (Week 3): Pt will complete toileting (hygiene and clothing management) with min assist OT Short Term Goal 4 (Week 3): Pt will complete LB dressing with min assist OT Short Term Goal 5 (Week 3): Pt will utilize LUE as gross assist to complete grooming tasks.  Skilled Therapeutic Interventions/Progress Updates:    1) Pt finishing breakfast upon arrival, discussed d/c planning with pt reporting feeling fairly confident with self-care tasks and progress with LUE functional return however reports that his wife is concerned about his safety and mobility.  Discussed setting up family education to allow her to attend therapies with him and begin hands on education to provide assistance where needed.  Followed up with [facility] to attempt to set up education for Tues and Weds.    Pt reports having completed LB bathing and dressing when toileting early this AM.  Engaged in UB bathing and dressing at sink with pt with increased motor control with LUE with washing UB.  Continues to have decreased sensation, requiring verbal cues for proper placement with mobility and with donning shirt due to decreased sensation requiring increased time.  Verbal cues for donning shoes secondary to back tucked in and pt with no awareness due to impaired sensation.  2) Pt seen for 1:1 OT with focus on LUE NM re-ed.  Utilized LUE with grasping pegs and placing in peg board with focus on tip to tip pinch and 3 jaw chuck as  well as increasing coordination with manipulating peg in hand.  Pt with increasingly Rt lean and overcompensation of trunk and Lt shoulder hike.  Engaged in lateral weight shifting to promote midline sitting balance and discussed improved trunk control with reaching activity.  Pt with increased ability to obtain pegs but continued with difficulty manipulating them to place in peg board.  Encouraged pt to perform translation and finger coordination exercises during down time.  Therapy Documentation Precautions:  Precautions Precautions: Fall Precaution Comments: LUE and LLE hemiparesis Restrictions Weight Bearing Restrictions: No General:   Vital Signs: Therapy Vitals Pulse Rate: 62 Pain:  Pt with no c/o pain  See FIM for current functional status  Therapy/Group: Individual Therapy  Simonne Come 06/27/2013, 9:28 AM

## 2013-06-27 NOTE — Progress Notes (Signed)
Physical Therapy Note  Patient Details  Name: Donald [name] MRN: [id number] Date of Birth: Oct 24, 1945 Today's Date: 06/27/2013  Time: [phone] 40 minutes  1:1 No c/o pain.  Gait training with RW with L AFO and L wrist support.  Pt requires mod A for balance and trunk control, tactile cues to prevent L LE hyperextension, manual facilitation to control wt shifts and cues to control step length.  Pt with good step through pattern without cuing, good anterior translation of L hip in stance.  Pt able to gait 55' x 2, improving with repetition.  NMR focusing on L hip and core control with sit to stands with hip adduction, standing R LE hip abd/add to focus on L LE control.  Pt requires min-mod A for standing balance, fatigues easily with adduction activities.  Pt expresses understanding that he will be primarily w/c level at home with hopes to progress with home health therapy to being able to gait with his wife's assistance at home.   Ceceilia Cephus 06/27/2013, 10:25 AM

## 2013-06-27 NOTE — Progress Notes (Signed)
Social Work Patient ID: Donald [name], male   DOB: 1946/02/21, 68 y.o.   MRN: [id number] Met wit pt who has spoken with wife when worker tired voice mail is full on her phone.  Wife to come in tomorrow at 65;00 to begin family education. Will discuss DME and follow up and see if wife needs to come in Wed also.

## 2013-06-28 ENCOUNTER — Inpatient Hospital Stay (HOSPITAL_COMMUNITY): Payer: Medicare Other | Admitting: Physical Therapy

## 2013-06-28 ENCOUNTER — Inpatient Hospital Stay (HOSPITAL_COMMUNITY): Payer: Medicare Other | Admitting: Occupational Therapy

## 2013-06-28 LAB — GLUCOSE, CAPILLARY
Glucose-Capillary: 137 mg/dL — ABNORMAL HIGH (ref 70–99)
Glucose-Capillary: 128 mg/dL — ABNORMAL HIGH (ref 70–99)
Glucose-Capillary: 123 mg/dL — ABNORMAL HIGH (ref 70–99)
Glucose-Capillary: 143 mg/dL — ABNORMAL HIGH (ref 70–99)

## 2013-06-28 MED ORDER — HYDROCHLOROTHIAZIDE 12.5 MG PO CAPS
12.5000 mg | ORAL_CAPSULE | Freq: Every day | ORAL | Status: DC
Start: 2013-06-28 — End: 2013-06-29
  Administered 2013-06-28: 12.5 mg via ORAL
  Filled 2013-06-28 (×3): qty 1

## 2013-06-28 NOTE — Progress Notes (Signed)
Physical Therapy Session Note  Patient Details  Name: Donald [name] MRN: [id number] Date of Birth: 01-Jul-1945  Today's Date: 06/28/2013 Time: 3295-1884 and 1660-6301 Time Calculation (min): 57 min and 58 min  Short Term Goals: Week 3:  PT Short Term Goal 1 (Week 3): = LTG for D/C on 06/30/13  Skilled Therapeutic Interventions/Progress Updates:   Pt wife present for further family education with focus on functional household transfers.  Continued to discuss w/c accessibility of bathroom and den and what bed will be available to use.  Wife requesting to hold delivery of hospital bed another day since they may be able to borrow a queen bed from his parents.  Wife to go look at beds today and have one moved to daughter's house.  Discussed optimal height of bed for safe, efficient transfers.  Wife also to purchase recliner for pt to use at daughter's house; discussed safety with recliner and purchasing one that does not rock.    Discussed set up of bathroom.  Wife reports that once the w/c is in the bathroom there is no room to turn the w/c and pt would have to face the toilet in the w/c.  Set up [facility] in pt room to simulate set up of bathroom.  Had pt perform squat pivot w/c <> BSC with UE support on arm rests straight on from in front of BSC with min A and verbal cues for sequencing of pivoting.  Will need to practice again.  Also discussed performing bed <> BSC or w/c <> BSC transfers for toileting until pt cleared to perform transfer by [facility] or HHPT at daughter's house.  Pt currently not safe to ambulate into/out of bathroom with RW and wife assistance.  Wife to measure and make sure BSC will fit in bathroom between tub and cabinet.  Transported to apartment and performed w/c <> bed transfer with pt performing squat pivot supervision and performed supine <> sit mod I with therapist verbalizing to wife safe positioning of w/c, sequencing of w/c set up to prepare for transfer and when appropriate to  doff/don shoes and AFO.  Will continue to practice transfers this pm as well as w/c bumping over threshold for home entry/exit; will perform actual car transfer tomorrow.    PM session:  Wife present for continued education.  Focus of pm session on sequencing doffing and donning of L AFO.  Wife able to demonstrate doffing and donning of AFO with verbal cues. Wife asking if pt needs to be extended another week.  Discussed with wife pt original goals and goal for w/c level upon D/C secondary to daughter's home being w/c accessible without stairs to negotiate for entry/exit.  Also discussed that pt has met his LTG and another week would not be enough time for him to reach supervision level with gait.  Discussed that pt will continue to progress with HHPT and outpatient PT.  Pt verbalizes understanding that he is currently unsafe to ambulate at home with family and assures wife that he will remain w/c level and will not attempt to ambulate without therapy present.  Performed w/c mobility mod I to gym.  Performed gait with RW with L blue rocker AFO and L hand orthosis in controlled environment x 100' with min A with intermittent verbal cues for posture and activation through LLE and min tactile cues at trunk and pelvis for anterior rotation and extension.  Pt demonstrated improved control of RW today and improved advancement of LLE.  Continued education with  wife with demonstrating again how to bump w/c over low threshold.  Had wife give repeat demonstration x 2 with supervision.  Pt returned to room and performed transfer w/c > recliner supervision squat pivot.    Therapy Documentation Precautions:  Precautions Precautions: Fall Precaution Comments: LUE and LLE hemiparesis Restrictions Weight Bearing Restrictions: No Pain:  No c/o pain  See FIM for current functional status  Therapy/Group: Individual Therapy  Raylene [facility]ts [facility] 06/28/2013, 12:28 PM

## 2013-06-28 NOTE — Progress Notes (Signed)
Social Work Patient ID: Donald [name], male   DOB: 1946/05/07, 68 y.o.   MRN: [id number] Wife is here for family education to begin learning.  Discussed the DME with both and team recommends.  Pt needs hospital bed for elevation and transfers He requires this for his CHF and other medical issues.  He is unable to use a wedge to elevate himself.  He requires a lightweight wheelchair to be able to self propel and uses for Self care.  He is unable to use a standard weight wheelchair for these processes.  Wife is aware she may need to come in tomorrow for more education to prepare for discharge Thursday.

## 2013-06-28 NOTE — Progress Notes (Signed)
Occupational Therapy Session Note  Patient Details  Name: Donald [name] MRN: [id number] Date of Birth: [birth year]  Today's Date: 06/28/2013 Time: 8099-8338 and 2505-3976 Time Calculation (min): 57 min and 30 min  Short Term Goals: Week 3:  OT Short Term Goal 1 (Week 3): Pt will complete toilet transfer with min assist 5/5 opportunities OT Short Term Goal 2 (Week 3): Pt will complete transfer to tub bench with min assist of one caregiver with fewer than 25% cues for safety and sequencing OT Short Term Goal 3 (Week 3): Pt will complete toileting (hygiene and clothing management) with min assist OT Short Term Goal 4 (Week 3): Pt will complete LB dressing with min assist OT Short Term Goal 5 (Week 3): Pt will utilize LUE as gross assist to complete grooming tasks.  Skilled Therapeutic Interventions/Progress Updates:    1) Hands on family education initiated with wife during last 25 mins of session.  Pt completed seated grooming tasks and discussed setup of bathroom at daughter's home and necessary DME during first half of session.  Once wife arrived, she observed pt with UB and LB dressing and discussed hemi-technique and well as verbal cues to provide as needed.  Engaged in sit <> stand x2 demonstrating to wife proper LLE placement for WB and improved standing balance and hand placement as needed.  Wife return demonstrated sit > stand with pt and asked appropriate questions regarding assistance and DME for home.    2) Hands on family education with wife with focus on tub/shower transfers with tub bench.  Demonstrated proper technique with focus on LLE placement and "low and slow" with squat/scoot pivot as well as discussed lateral leans with washing buttocks at this time.  Pt completed tub bench transfer x2 with wife with min assist with good carryover and wife able to provide cues for safety as needed.  Therapy Documentation Precautions:  Precautions Precautions: Fall Precaution Comments:  LUE and LLE hemiparesis Restrictions Weight Bearing Restrictions: No Pain: Pain Assessment Pain Assessment: No/denies pain Pain Score: 0-No pain  See FIM for current functional status  Therapy/Group: Individual Therapy  Simonne Come 06/28/2013, 11:56 AM

## 2013-06-28 NOTE — Progress Notes (Addendum)
Pts. SBP was >180's taken in the rt. Arm asymptomatic, after dose of BP med BP still high. P. LovePA made aware and notified. Will cont. to monitor pt.

## 2013-06-28 NOTE — Progress Notes (Signed)
68 y.o. right-handed male with history of hypertension as well as diabetes mellitus. Patient is from [place] and was visiting family in [place]. Admitted 06/03/2013 of left-sided weakness and slurred speech. 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 Echocardiogram with ejection fraction 65% grade 1 diastolic dysfunction. CT angiogram head and neck with no proximal branch occlusion or stenosis. Patient did receive TPA. Neurology services consulted placed on Plavix for CVA prophylaxis. TEE showed no PFO or thrombus loop recorder just implanted to rule out arrhythmia as potential cause. 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 rate 18, height 6\' 3"  (1.905 m), weight 118.389 kg (261 lb), SpO2 96.00%. No results found. Results for orders placed during the hospital encounter of 06/07/13 (from the past 72 hour(s))  GLUCOSE, CAPILLARY     Status: Abnormal   Collection Time    06/25/13  7:13 AM      Result Value Range   Glucose-Capillary 136 (*) 70 - 99 mg/dL   Comment 1 Notify RN    GLUCOSE, CAPILLARY     Status: Abnormal   Collection Time    06/25/13 12:10 PM      Result Value Range   Glucose-Capillary 116 (*) 70 - 99 mg/dL   Comment 1 Documented in Chart    GLUCOSE, CAPILLARY     Status: Abnormal   Collection Time    06/25/13  4:05 PM      Result Value Range   Glucose-Capillary 173 (*) 70 - 99 mg/dL   Comment 1 Notify RN    GLUCOSE, CAPILLARY     Status: None   Collection Time    06/25/13  8:47 PM      Result Value Range   Glucose-Capillary 75  70 - 99 mg/dL   GLUCOSE, CAPILLARY     Status: Abnormal   Collection Time    06/26/13  7:11 AM      Result Value Range   Glucose-Capillary 132 (*) 70 - 99 mg/dL  GLUCOSE, CAPILLARY     Status: Abnormal   Collection Time    06/26/13 11:40 AM      Result Value Range   Glucose-Capillary 117 (*) 70 - 99 mg/dL   Comment 1 Notify RN    GLUCOSE, CAPILLARY     Status: Abnormal   Collection Time    06/26/13  4:01 PM      Result Value Range   Glucose-Capillary 117 (*) 70 - 99 mg/dL   Comment 1 Notify RN    GLUCOSE, CAPILLARY     Status: None   Collection Time    06/26/13  9:13 PM      Result Value Range   Glucose-Capillary 83  70 - 99 mg/dL  GLUCOSE, CAPILLARY     Status: Abnormal   Collection Time    06/27/13  7:13 AM      Result Value Range   Glucose-Capillary 153 (*) 70 - 99 mg/dL   Comment 1 Notify RN    GLUCOSE, CAPILLARY     Status: None   Collection Time    06/27/13 12:01 PM      Result Value Range  Glucose-Capillary 74  70 - 99 mg/dL   Comment 1 Notify RN    GLUCOSE, CAPILLARY     Status: None   Collection Time    06/27/13  4:06 PM      Result Value Range   Glucose-Capillary 90  70 - 99 mg/dL   Comment 1 Notify RN    GLUCOSE, CAPILLARY     Status: Abnormal   Collection Time    06/27/13 10:32 PM      Result Value Range   Glucose-Capillary 132 (*) 70 - 99 mg/dL     HEENT: normal Cardio: RRR and no murmur Resp: CTA B/L and unlabored GI: BS positive and non distended Extremity:  Pulses positive and No Edema Skin:   Intact except R med hand abrasion, no drainage, left elbow normal, loop recorder incision healing well Neuro: Alert/Oriented, Cranial Nerve Abnormalities left central 7, Abnormal Sensory absent left hand and foot, Abnormal Motor 3- Left shoulder abd, add, Bi, Tri and Thumb ext,3-/5 finger #2 and 3ext 3- L Hip Add, 3-/5 hip flexion, 3-/5 KE, trace toe flexion, Dysarthric and Other poor lip closure on Left, very poor motor control on Left Musc/Skel:  Other Left shoulder  sublux Gen NAD   Assessment/Plan: 1. Functional deficits secondary to R MCA embolic infarct  which require 3+ hours per day of interdisciplinary therapy in a comprehensive inpatient rehab setting. Physiatrist is providing close team supervision and 24 hour management of active medical problems listed below. Physiatrist and rehab team continue to assess barriers to discharge/monitor patient progress toward functional and medical goals.  FIM: FIM - Bathing Bathing Steps Patient Completed: Chest;Right Arm;Left Arm;Abdomen;Right upper leg;Left upper leg Bathing: 5: Supervision: Safety issues/verbal cues  FIM - Upper Body Dressing/Undressing Upper body dressing/undressing steps patient completed: Put head through opening of pull over shirt/dress;Pull shirt over trunk;Thread/unthread right sleeve of pullover shirt/dresss;Thread/unthread left sleeve of pullover shirt/dress Upper body dressing/undressing: 5: Supervision: Safety issues/verbal cues FIM - Lower Body Dressing/Undressing Lower body dressing/undressing steps patient completed: Don/Doff right shoe;Don/Doff left shoe;Fasten/unfasten right shoe;Fasten/unfasten left shoe Lower body dressing/undressing: 4: Min-Patient completed 75 plus % of tasks  FIM - Toileting Toileting steps completed by patient: Performs perineal hygiene Toileting Assistive Devices: Grab bar or rail for support Toileting: 3: Mod-Patient completed 2 of 3 steps  FIM - Radio producer Devices: Elevated toilet seat;Grab bars Toilet Transfers: 3-To toilet/BSC: Mod A (lift or lower assist);3-From toilet/BSC: Mod A (lift or lower assist)  FIM - Bed/Chair Transfer Bed/Chair Transfer Assistive Devices: HOB elevated;Bed rails Bed/Chair Transfer: 4: Supine > Sit: Min A (steadying Pt. > 75%/lift 1 leg);3: Sit > Supine: Mod A (lifting assist/Pt. 50-74%/lift 2 legs)  FIM - Locomotion: Wheelchair Distance: 150 Locomotion: Wheelchair: 0: Activity  did not occur FIM - Locomotion: Ambulation Locomotion: Ambulation Assistive Devices: Orthosis;Other (comment) Ambulation/Gait Assistance: 3: Mod assist Locomotion: Ambulation: 0: Activity did not occur  Comprehension Comprehension Mode: Auditory Comprehension: 6-Follows complex conversation/direction: With extra time/assistive device  Expression Expression Mode: Verbal Expression: 6-Expresses complex ideas: With extra time/assistive device  Social Interaction Social Interaction: 6-Interacts appropriately with others with medication or extra time (anti-anxiety, antidepressant).  Problem Solving Problem Solving: 6-Solves complex problems: With extra time  Memory Memory: 6-More than reasonable amt of time  Medical Problem List and Plan:  1. 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 Loop recorder implanted 06/07/13. On plavix 2. DVT Prophylaxis/Anticoagulation: SCDs. Monitor for any signs of DVT  3. Pain Management: Tylenol as needed for  4.  Neuropsych: This patient is capable of making decisions on his own behalf.  5. Hypertension. Norvasc 10 mg daily, Coreg 12.5 mg twice a day,increase Catapres 0.3 mg tid, lisinopril 40 mg daily. Monitor with increased mobility, trial HCTZ, plus cardura, if no better trial hydralazine 6. Diabetes mellitus with peripheral neuropathy.CBGs generally good but elevation this am Hemoglobin A1c 6.2. DiaBeta 5 mg daily, Glucophage 1500 mg daily. Check blood sugars a.c. and at bedtime. Education by team  Dietary ed for DM and weight loss 7. Sinusitis identified on MRI. , monitor off abx  LOS (Days) 21 A FACE TO FACE EVALUATION WAS PERFORMED  Peggy Monk E 06/28/2013, 6:57 AM

## 2013-06-29 ENCOUNTER — Inpatient Hospital Stay (HOSPITAL_COMMUNITY): Payer: Medicare Other | Admitting: Occupational Therapy

## 2013-06-29 ENCOUNTER — Inpatient Hospital Stay (HOSPITAL_COMMUNITY): Payer: Medicare Other | Admitting: Physical Therapy

## 2013-06-29 LAB — GLUCOSE, CAPILLARY
Glucose-Capillary: 137 mg/dL — ABNORMAL HIGH (ref 70–99)
Glucose-Capillary: 113 mg/dL — ABNORMAL HIGH (ref 70–99)
Glucose-Capillary: 102 mg/dL — ABNORMAL HIGH (ref 70–99)
Glucose-Capillary: 111 mg/dL — ABNORMAL HIGH (ref 70–99)

## 2013-06-29 MED ORDER — ISOSORB DINITRATE-HYDRALAZINE 20-37.5 MG PO TABS
1.0000 | ORAL_TABLET | Freq: Two times a day (BID) | ORAL | Status: DC
  Administered 2013-06-29 – 2013-06-30 (×3): 1 via ORAL
  Filled 2013-06-29 (×6): qty 1

## 2013-06-29 NOTE — Progress Notes (Addendum)
68 y.o. right-handed male with history of hypertension as well as diabetes mellitus. Patient is from [place] and was visiting family in [place]. Admitted 06/03/2013 of left-sided weakness and slurred speech. 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 Echocardiogram with ejection fraction 30% grade 1 diastolic dysfunction. CT angiogram head and neck with no proximal branch occlusion or stenosis. Patient did receive TPA. Neurology services consulted placed on Plavix for CVA prophylaxis. TEE showed no PFO or thrombus loop recorder just implanted to rule out arrhythmia as potential cause. Acute on chronic paranasal sinusitis identified on MRI of the brain placed on amoxicillin  Subjective/Complaints: Discussed systolic BP Review of Systems - Negative except weakness on Left and as above  Objective: Vital Signs: Blood pressure 172/92, pulse 56, temperature 97.7 F (36.5 C), temperature source Oral, resp. rate 18, height 6\' 3"  (1.905 m), weight 118.389 kg (261 lb), SpO2 98.00%. No results found. Results for orders placed during the hospital encounter of 06/07/13 (from the past 72 hour(s))  GLUCOSE, CAPILLARY     Status: Abnormal   Collection Time    06/26/13  7:11 AM      Result Value Range   Glucose-Capillary 132 (*) 70 - 99 mg/dL  GLUCOSE, CAPILLARY     Status: Abnormal   Collection Time    06/26/13 11:40 AM      Result Value Range   Glucose-Capillary 117 (*) 70 - 99 mg/dL   Comment 1 Notify RN    GLUCOSE, CAPILLARY     Status: Abnormal   Collection Time    06/26/13  4:01 PM      Result Value Range   Glucose-Capillary 117 (*) 70 - 99 mg/dL   Comment 1 Notify RN    GLUCOSE, CAPILLARY     Status: None   Collection Time    06/26/13  9:13 PM      Result Value Range   Glucose-Capillary 83  70 - 99 mg/dL  GLUCOSE, CAPILLARY     Status: Abnormal    Collection Time    06/27/13  7:13 AM      Result Value Range   Glucose-Capillary 153 (*) 70 - 99 mg/dL   Comment 1 Notify RN    GLUCOSE, CAPILLARY     Status: None   Collection Time    06/27/13 12:01 PM      Result Value Range   Glucose-Capillary 74  70 - 99 mg/dL   Comment 1 Notify RN    GLUCOSE, CAPILLARY     Status: None   Collection Time    06/27/13  4:06 PM      Result Value Range   Glucose-Capillary 90  70 - 99 mg/dL   Comment 1 Notify RN    GLUCOSE, CAPILLARY     Status: Abnormal   Collection Time    06/27/13 10:32 PM      Result Value Range   Glucose-Capillary 132 (*) 70 - 99 mg/dL  GLUCOSE, CAPILLARY     Status: Abnormal   Collection Time    06/28/13  7:20 AM      Result Value Range   Glucose-Capillary 137 (*) 70 - 99 mg/dL   Comment 1 Notify RN    GLUCOSE, CAPILLARY     Status: Abnormal   Collection Time    06/28/13 11:20 AM      Result Value Range   Glucose-Capillary 128 (*)  70 - 99 mg/dL   Comment 1 Notify RN    GLUCOSE, CAPILLARY     Status: Abnormal   Collection Time    06/28/13  4:58 PM      Result Value Range   Glucose-Capillary 123 (*) 70 - 99 mg/dL   Comment 1 Notify RN    GLUCOSE, CAPILLARY     Status: Abnormal   Collection Time    06/28/13  9:04 PM      Result Value Range   Glucose-Capillary 143 (*) 70 - 99 mg/dL     HEENT: normal Cardio: RRR and no murmur Resp: CTA B/L and unlabored GI: BS positive and non distended Extremity:  Pulses positive and No Edema Skin:   Intact except R med hand abrasion, no drainage, left elbow normal, loop recorder incision healing well Neuro: Alert/Oriented, Cranial Nerve Abnormalities left central 7, Abnormal Sensory absent left hand and foot, Abnormal Motor 3- Left shoulder abd, add, Bi, Tri and Thumb ext,3-/5 finger #2 and 3ext 3- L Hip Add, 3-/5 hip flexion, 3-/5 KE, trace toe flexion, Dysarthric and Other poor lip closure on Left, very poor motor control on Left Musc/Skel:  Other Left shoulder sublux Gen  NAD   Assessment/Plan: 1. Functional deficits secondary to R MCA embolic infarct  which require 3+ hours per day of interdisciplinary therapy in a comprehensive inpatient rehab setting. Physiatrist is providing close team supervision and 24 hour management of active medical problems listed below. Physiatrist and rehab team continue to assess barriers to discharge/monitor patient progress toward functional and medical goals.  FIM: FIM - Bathing Bathing Steps Patient Completed: Chest;Right Arm;Left Arm;Abdomen;Right upper leg;Left upper leg Bathing: 5: Supervision: Safety issues/verbal cues  FIM - Upper Body Dressing/Undressing Upper body dressing/undressing steps patient completed: Put head through opening of pull over shirt/dress;Pull shirt over trunk;Thread/unthread right sleeve of pullover shirt/dresss;Thread/unthread left sleeve of pullover shirt/dress Upper body dressing/undressing: 5: Set-up assist to: Obtain clothing/put away FIM - Lower Body Dressing/Undressing Lower body dressing/undressing steps patient completed: Thread/unthread right pants leg;Thread/unthread left pants leg;Pull pants up/down;Don/Doff right sock;Don/Doff left sock;Don/Doff right shoe;Don/Doff left shoe;Fasten/unfasten right shoe;Fasten/unfasten left shoe Lower body dressing/undressing: 4: Steadying Assist  FIM - Toileting Toileting steps completed by patient: Performs perineal hygiene Toileting Assistive Devices: Grab bar or rail for support Toileting: 3: Mod-Patient completed 2 of 3 steps  FIM - Radio producer Devices: Recruitment consultant Transfers: 4-To toilet/BSC: Min A (steadying Pt. > 75%);4-From toilet/BSC: Min A (steadying Pt. > 75%)  FIM - Bed/Chair Transfer Bed/Chair Transfer Assistive Devices: Arm rests Bed/Chair Transfer: 6: Supine > Sit: No assist;6: Sit > Supine: No assist;5: Bed > Chair or W/C: Supervision (verbal cues/safety issues);5: Chair or W/C > Bed:  Supervision (verbal cues/safety issues)  FIM - Locomotion: Wheelchair Distance: 150 Locomotion: Wheelchair: 1: Total Assistance/staff pushes wheelchair (Pt<25%) FIM - Locomotion: Ambulation Locomotion: Ambulation Assistive Devices: Orthosis;Other (comment) Ambulation/Gait Assistance: 3: Mod assist Locomotion: Ambulation: 0: Activity did not occur  Comprehension Comprehension Mode: Auditory Comprehension: 6-Follows complex conversation/direction: With extra time/assistive device  Expression Expression Mode: Verbal Expression: 6-Expresses complex ideas: With extra time/assistive device  Social Interaction Social Interaction: 6-Interacts appropriately with others with medication or extra time (anti-anxiety, antidepressant).  Problem Solving Problem Solving: 6-Solves complex problems: With extra time  Memory Memory: 6-More than reasonable amt of time  Medical Problem List and Plan:  1. 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 Loop recorder implanted 06/07/13. On plavix 2. DVT Prophylaxis/Anticoagulation: SCDs. Monitor for any signs  of DVT  3. Pain Management: Tylenol as needed for  4. Neuropsych: This patient is capable of making decisions on his own behalf.  5. Hypertension. Norvasc 10 mg daily, Coreg 12.5 mg twice a day,increase Catapres 0.3 mg tid, lisinopril 40 mg daily. Monitor with increased mobility, d/c HCTZ and cardura, trial Bidil 6. Diabetes mellitus with peripheral neuropathy.CBGs generally good but elevation this am Hemoglobin A1c 6.2. DiaBeta 5 mg daily, Glucophage 1500 mg daily. Check blood sugars a.c. and at bedtime. Education by team  Dietary ed for DM and weight loss 7. Sinusitis identified on MRI. , monitor off abx  LOS (Days) 22 A FACE TO FACE EVALUATION WAS PERFORMED  KIRSTEINS,ANDREW E 06/29/2013, 6:48 AM

## 2013-06-29 NOTE — Discharge Instructions (Signed)
Inpatient Rehab Discharge Instructions  Shaquelle Hernon Discharge date and time: No discharge date for patient encounter.   Activities/Precautions/ Functional Status: Activity: activity as tolerated Diet: diabetic diet Wound Care: none needed Functional status:  ___ No restrictions     ___ Walk up steps independently _x__ 24/7 supervision/assistance   ___ Walk up steps with assistance ___ Intermittent supervision/assistance  ___ Bathe/dress independently ___ Walk with walker     ___ Bathe/dress with assistance ___ Walk Independently    ___ Shower independently ___ Walk with assistance    ___ Shower with assistance ___ No alcohol     ___ Return to work/school ________  Special Instructions: Call for appointment with Dr. [name] to assess loop recorder    COMMUNITY REFERRALS UPON DISCHARGE:    Home Health:   PT, OT, [place] JQBHA:[phone] Date of last service:06/30/2013    Medical Equipment/Items Ordered:WHEELCHAIR, WIDE ROLLING WALKER, DROP-ARM BSC, [place]      [phone]   GENERAL COMMUNITY RESOURCES FOR PATIENT/FAMILY: Support Groups:[facility] SUPPORT GROUP      STROKE/TIA DISCHARGE INSTRUCTIONS SMOKING Cigarette smoking nearly doubles your risk of having a stroke & is the single most alterable risk factor  If you smoke or have smoked in the last 12 months, you are advised to quit smoking for your health.  Most of the excess cardiovascular risk related to smoking disappears within a year of stopping.  Ask you doctor about anti-smoking medications  [place] Quit Line: 1-800-QUIT NOW  Free Smoking Cessation Classes [phone]  CHOLESTEROL Know your levels; limit fat & cholesterol in your diet  Lipid Panel     Component Value Date/Time   CHOL 145 06/03/2013 0455   TRIG 163* 06/03/2013 0455   HDL 28* 06/03/2013 0455   CHOLHDL 5.2 06/03/2013 0455   VLDL 33 06/03/2013 0455   LDLCALC 84 06/03/2013 0455      Many patients  benefit from treatment even if their cholesterol is at goal.  Goal: Total Cholesterol (CHOL) less than 160  Goal:  Triglycerides (TRIG) less than 150  Goal:  HDL greater than 40  Goal:  LDL (LDLCALC) less than 100   BLOOD PRESSURE American Stroke Association blood pressure target is less that 120/80 mm/Hg  Your discharge blood pressure is:  BP: 176/93 mmHg  Monitor your blood pressure  Limit your salt and alcohol intake  Many individuals will require more than one medication for high blood pressure  DIABETES (A1c is a blood sugar average for last 3 months) Goal HGBA1c is under 7% (HBGA1c is blood sugar average for last 3 months)  Diabetes:     Lab Results  Component Value Date   HGBA1C 6.2* 06/03/2013     Your HGBA1c can be lowered with medications, healthy diet, and exercise.  Check your blood sugar as directed by your physician  Call your physician if you experience unexplained or low blood sugars.  PHYSICAL ACTIVITY/REHABILITATION Goal is 30 minutes at least 4 days per week  Activity: Assistance for safety Therapies: Physical Therapy: Home Health Return to work:   Activity decreases your risk of heart attack and stroke and makes your heart stronger.  It helps control your weight and blood pressure; helps you relax and can improve your mood.  Participate in a regular exercise program.  Talk with your doctor about the best form of exercise for you (dancing, walking, swimming, cycling).  DIET/WEIGHT Goal is to maintain a healthy weight  Your discharge diet is: Carb Control  liquids Your height is:  Height: 6\' 3"  (190.5 cm) Your current weight is: Weight: 118.389 kg (261 lb) Your Body Mass Index (BMI) is:  BMI (Calculated): 3.1  Following the type of diet specifically designed for you will help prevent another stroke.  Your goal weight range is:    Your goal Body Mass Index (BMI) is 19-24.  Healthy food habits can help reduce 3 risk factors for stroke:  High  cholesterol, hypertension, and excess weight.  RESOURCES Stroke/Support Group:  Call [phone]   STROKE EDUCATION PROVIDED/REVIEWED AND GIVEN TO PATIENT Stroke warning signs and symptoms How to activate emergency medical system (call 911). Medications prescribed at discharge. Need for follow-up after discharge. Personal risk factors for stroke. Pneumonia vaccine given: No Flu vaccine given: No My questions have been answered, the writing is legible, and I understand these instructions.  I will adhere to these goals & educational materials that have been provided to me after my discharge from the hospital.       My questions have been answered and I understand these instructions. I will adhere to these goals and the provided educational materials after my discharge from the hospital.  Patient/Caregiver Signature _______________________________ Date __________  Clinician Signature _______________________________________ Date __________  Please bring this form and your medication list with you to all your follow-up doctor's appointments.

## 2013-06-29 NOTE — Discharge Summary (Signed)
Discharge summary job # [id number]

## 2013-06-29 NOTE — Discharge Summary (Signed)
Donald [name], Donald [name]          ACCOUNT NO.:  [id number]  MEDICAL RECORD NO.:  [id number]  LOCATION:  4W04C                        FACILITY:  [facility]  PHYSICIAN:  Charlett Blake, M.D.DATE OF BIRTH:  07/06/1945  DATE OF ADMISSION:  06/07/2013 DATE OF DISCHARGE:  07/02/2013                              DISCHARGE SUMMARY   DISCHARGE DIAGNOSES: 1. Right middle cerebral artery infarct, felt to be embolic. 2. Sequential compression devices for DVT prophylaxis. 3. Hypertension. 4. Diabetes mellitus, peripheral neuropathy. 5. Sinusitis identified on MRI.  HISTORY OF PRESENT ILLNESS:  This is a 68 year old right-handed male with history of hypertension, diabetes mellitus.  The patient is from [place].  He was visiting family in [place].  Admitted June 03, 2013, with left-sided weakness and slurred speech.  MRI of the brain showed acute large right middle cerebral artery infarction as well as left occipital encephalomalacia suggesting remote small left posterior cerebral artery territory infarct.  Echocardiogram with ejection fraction of 49%, grade 1 diastolic dysfunction.  CT angiogram of the head and neck with no proximal branch occlusion or stenosis.  The patient did not receive tPA.  Neurology Service was consulted, placed on Plavix for CVA prophylaxis.  TEE showed no PFO or thrombus with implantable loop recorder completed to rule out any arrhythmia as potential cause of CVA.  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  Physical and occupational therapy ongoing.  The patient was admitted for comprehensive rehab program.  PAST MEDICAL HISTORY:  See discharge diagnoses.  SOCIAL HISTORY:  Lives with spouse.  FUNCTIONAL HISTORY:  Prior to admission, independent.  Working full time.  FUNCTIONAL STATUS:  Upon admission to rehab services was +2 total assist for sit to stand, +2 total assist stand to sit.  PHYSICAL  EXAMINATION:  VITAL SIGNS:  Blood pressure 179/83, pulse 62, temperature 98, respirations 18. GENERAL:  This was an alert male in no acute distress. HEENT:  He does have a right gaze preference.  Pupils are round and reactive to light.  He followed full commands. LUNGS:  Clear to auscultation. CARDIAC:  Regular rate and rhythm. ABDOMEN:  Soft, nontender.  Good bowel sounds.  REHABILITATION HOSPITAL COURSE:  The patient was admitted to inpatient rehab services with therapies initiated on a 3-hour daily basis consisting of physical therapy, occupational therapy, and rehabilitation nursing.  The following issues were addressed during the patient's rehabilitation stay.  Pertaining to Donald [name]'s right MCA infarct remained stable status post loop recorder.  He remained on Plavix therapy.  He would follow up with Neurology Services.  Sequential compression device is in place for DVT prophylaxis.  Noted hypertension, on multiple antihypertensive medications including Norvasc, Coreg, clonidine, lisinopril and Bidil added to regimen 06/29/2013. He did have some trouble tolerating the bidil was some decrease in blood pressure and was changed to hydralazine 10 mg every 8 hours. The patient did receive full education in regards to managing his hypertension.  Plans to remain here in [place] while he recovered from his CVA.  Case management was aware of the need for followup with PCP in the area.  He did have a history of diabetes mellitus with peripheral neuropathy.  Hemoglobin  A1c 6.2.  He was on DiaBeta as well as Glucophage, again receiving full education in regards to his diabetes.  He did complete a 5-day course of amoxicillin for sinusitis identified on MRI.  He remained afebrile.  The patient received weekly collaborative interdisciplinary team conferences to discuss estimated length of stay, family teaching, and any barriers to his discharge.  The patient performed gait with a  rolling walker with a left Blue Rocker AFO and left hand orthosis in a controlled environment at 100 feet with minimal assistance with intermittent cues for posture and activation of left lower extremity with minimal tactile cues at trunk and pelvis for anterior rotation and extension.  Team did educate the patient's wife with demonstrating how to bump wheelchair over low thresholds.  The patient completed lower body dressing and toileting.  He was engaging in upper body bathing and dressing.  He had some decreased sensation noted to the affected side.  Full family teaching was completed and plan was to be discharged to home.  DISCHARGE MEDICATIONS: 1. Norvasc 10 mg p.o. daily. 2. Coreg 25 mg p.o. b.i.d. 3. Clonidine 0.3 mg p.o. t.i.d. 4. Plavix 75 mg p.o. daily. 5. DiaBeta 5 mg p.o. daily. 6. hydralazine 10 mg 3 times a day 7. Lisinopril 40 mg p.o. daily. 8. Glucophage 1500 mg p.o. daily. 9.Protonix 40 mg p.o. daily.   DIET:  Diabetic diet.  SPECIAL INSTRUCTIONS:  The patient should follow up outpatient rehab services, Dr. Alysia Penna on July 22, 2013, Dr. Antony Contras, Neurology Service, call for appointment in 1 month.  Dr. Thompson Grayer, Cardiology Services to assess loop recorder.  Case management arranging PCP in the [place] area.  Ongoing therapies were arranged as per rehab services.     Lauraine Rinne, P.A.   ______________________________ Charlett Blake, M.D.    DA/MEDQ  D:  06/29/2013  T:  06/29/2013  Job:  [id number]  cc:   Thompson Grayer, MD Pramod P. Leonie Man, MD

## 2013-06-29 NOTE — Progress Notes (Signed)
Occupational Therapy Discharge Summary  Patient Details  Name: Donald [name] MRN: [id number] Date of Birth: Jul 22, 1945  Today's Date: 06/29/2013  Patient has met 12 of 12 long term goals due to improved balance, postural control, ability to compensate for deficits, functional use of  LEFT upper and LEFT lower extremity, improved awareness and improved coordination.  Patient to discharge at [facility] Assist level.  Patient's care partner is independent to provide the necessary physical and cognitive assistance at discharge.    Reasons goals not met: N/A  Recommendation:  Patient will benefit from ongoing skilled OT services in home health setting to continue to advance functional skills in the area of BADL and Reduce care partner burden.  Equipment: BSC and tub bench  Reasons for discharge: treatment goals met and discharge from hospital  Patient/family agrees with progress made and goals achieved: Yes  OT Discharge Precautions/Restrictions  Precautions Precautions: Fall Precaution Comments: LUE and LLE hemiparesis General   Vital Signs Therapy Vitals BP: 108/68 mmHg Patient Position, if appropriate: Sitting Pain Pain Assessment Pain Assessment: No/denies pain ADL  See FIM Vision/Perception  Vision - History Baseline Vision: Wears glasses all the time Patient Visual Report: No change from baseline Vision - Assessment Eye Alignment: Within Functional Limits Perception Perception: Impaired Inattention/Neglect: Other (comment) (mild Lt inattention)  Cognition Overall Cognitive Status: Within Functional Limits for tasks assessed Arousal/Alertness: Awake/alert Orientation Level: Oriented X4 Memory: Appears intact Awareness: Appears intact Safety/Judgment: Appears intact Comments: slightly impulsive with mobility, requiring verbal cues for safety and sequencing Sensation Sensation Light Touch: Impaired Detail Light Touch Impaired Details: Impaired  LUE Stereognosis: Impaired by gross assessment Hot/Cold: Impaired by gross assessment Proprioception: Impaired Detail Proprioception Impaired Details: Impaired LUE Coordination Gross Motor Movements are Fluid and Coordinated: No Fine Motor Movements are Fluid and Coordinated: No Coordination and Movement Description: Pt developing LUE mobility, Brunstrom level 3 approaching 4, but with decreased motor control and grading Extremity/Trunk Assessment RUE Assessment RUE Assessment: Within Functional Limits LUE Assessment LUE Assessment: Exceptions to WFL LUE AROM (degrees) Left Shoulder Flexion: 85 Degrees Left Elbow Flexion: 100 Left Elbow Extension: 0 Left Wrist Extension: 0 Degrees Left Wrist Flexion: 25 Degrees Left Thumb Opposition: Digit 2;Digit 3;Digit 4 LUE Strength LUE Overall Strength Comments: Unable to sustain position against gravity, grossly 3-/5  See FIM for current functional status  Joclynn Lumb, [facility] 06/29/2013, 3:00 PM

## 2013-06-29 NOTE — Progress Notes (Signed)
Physical Therapy Discharge Summary  Patient Details  Name: Donald [name] MRN: [id number] Date of Birth: 03/13/1946  Today's Date: 06/29/2013 Time: 1300-1400 and [id number] Time Calculation (min): 60 min and 55 min  Patient has met 10 of 10 long term goals due to improved activity tolerance, improved balance, improved postural control, increased strength, ability to compensate for deficits, functional use of  left upper extremity and left lower extremity, improved attention, improved awareness and improved coordination.  Patient to discharge at a wheelchair level Modified Independent and has been advised to perform gait with HHPT only secondary to wife's recent bilat TKR and unable to assist physically.   Patient's care partner is independent to provide the necessary supervision assistance at discharge for transfers.  Reasons goals not met: All goals met  Recommendation:  Patient will benefit from ongoing skilled PT services in home health setting to continue to advance safe functional mobility, address ongoing impairments in L hemiparesis, impaired motor control, timing, sequencing, impaired sensation and proprioception, mild L inattention, impaired balance, gait, and minimize fall risk.  Equipment: Manual w/c and RW; L hand orthosis and L blue rocker AFO  Reasons for discharge: treatment goals met and discharge from hospital   Patient/family agrees with progress made and goals achieved: Yes  First PT session: Pt reporting feeling drained from new BP medication.  BP assessed in sitting and standing; RN alerted.  Pt agreeable to continue with therapy.  Re-assessment of sensation and strength.  Transferred from low recliner > w/c min A.  W/c mobility mod I but fatigued quickly.  Performed stair negotiation; see below.  Pt to remain in gym with OT.  Discussed with wife bed and recliner options for home and made recommendations for recliner without rocking for safety.  Wife and pt agree.     Second session: focus on equipment and car transfer.  Educated wife on w/c parts management for transport in car and set up for transfers.  Pt transferred to w/c and transported outside.  Verbalized safe w/c <> tall SUV transfer and had pt and wife return demonstrate with wife providing min A while pt performed stand pivot to/from SUV and assistance with placing LLE into and out of SUV.  Wife and pt feel comfortable with transfer.  Pt returned to room.  No further questions or concerns.    PT Discharge Precautions/Restrictions Precautions Precautions: Fall Precaution Comments: LUE and LLE hemiparesis Vital Signs Therapy Vitals BP: 108/68 mmHg Patient Position, if appropriate: Sitting Pain Pain Assessment Pain Assessment: No/denies pain Vision/Perception  Vision - History Baseline Vision: Wears glasses all the time Patient Visual Report: No change from baseline Vision - Assessment Eye Alignment: Within Functional Limits Perception Perception: Impaired Inattention/Neglect: Other (comment) (Mild L inattention secondary to impaired proprioception)  Cognition Overall Cognitive Status: Within Functional Limits for tasks assessed Arousal/Alertness: Awake/alert Orientation Level: Oriented X4 Memory: Appears intact Awareness: Appears intact Safety/Judgment: Appears intact Comments: slightly impulsive with mobility, requiring verbal cues for safety and sequencing Sensation Sensation Light Touch: Impaired Detail Light Touch Impaired Details: Impaired LLE;Absent LLE Stereognosis: Not tested Hot/Cold: Not tested Proprioception: Impaired Detail Proprioception Impaired Details: Impaired LUE;Impaired LLE Additional Comments: Impaired to light touch more proximal; absent to light touch distal; absent proprioception LUE, impaired LLE Coordination Gross Motor Movements are Fluid and Coordinated: No Fine Motor Movements are Fluid and Coordinated: No Coordination and Movement Description:  Pt developing LLE mobility but with decreased motor control and grading Motor  Motor Motor: Hemiplegia;Motor impersistence Motor - Discharge Observations: L  Hemiparesis with motor impersistence during muscle contraction LLE  Mobility Bed Mobility Bed Mobility: Supine to Sit;Sit to Supine Supine to Sit: 6: Modified independent (Device/Increase time) Sit to Supine: 6: Modified independent (Device/Increase time) Transfers Sit to Stand: 4: Min assist Stand to Sit: 4: Min assist Lateral/Scoot Transfers: 5: Supervision Lateral/Scoot Transfer Details (indicate cue type and reason): Still requires min verbal cues for safe placement of L foot prior to transfers Locomotion  Ambulation Ambulation: Yes Ambulation/Gait Assistance: 4: Min assist (yesterday) Ambulation Distance (Feet): 100 Feet Assistive device: Rolling walker;Other (Comment) (L blue rocker AFO with heel wedge) Ambulation/Gait Assistance Details: Continues to require intermittent tactile and verbal cues for full lateral and anterior weight shifting and anterior rotation of L pelvis to advance LLE and COG over L stance LE Stairs / Additional Locomotion Stairs: Yes Stairs Assistance: 3: Mod assist Stairs Assistance Details (indicate cue type and reason): Requires mod A to maintain LUE grip on rail, verbal cues for safe step to sequence with AFO, assistance to fully advance/clear and place LLE on next step and assistance to perform anterior weight shift to advance COG to L stance Stair Management Technique: Two rails;Step to pattern;Forwards Number of Stairs: 5 Height of Stairs: 6 Wheelchair Mobility Wheelchair Mobility: Yes Wheelchair Assistance: 6: Modified independent (Device/Increase time) Environmental health practitioner: Right upper extremity;Right lower extremity Wheelchair Parts Management: Independent Distance: 150  Trunk/Postural Assessment  Cervical Assessment Cervical Assessment: Within Functional Limits Thoracic  Assessment Thoracic Assessment: Within Functional Limits Lumbar Assessment Lumbar Assessment: Within Functional Limits Postural Control Postural Control: Deficits on evaluation (min A for postural control in standing/gait)  Balance Static Sitting Balance Static Sitting - Level of Assistance: 7: Independent Dynamic Sitting Balance Dynamic Sitting - Level of Assistance: 6: Modified independent (Device/Increase time) Static Standing Balance Static Standing - Balance Support: Right upper extremity supported;Left upper extremity supported Static Standing - Level of Assistance: 4: Min assist Dynamic Standing Balance Dynamic Standing - Balance Support: Right upper extremity supported;Left upper extremity supported Dynamic Standing - Level of Assistance: 4: Min assist;3: Mod assist Extremity Assessment  RUE Assessment RUE Assessment: Within Functional Limits LUE Assessment LUE Assessment: Exceptions to [facility] LUE AROM (degrees) Left Shoulder Flexion: 85 Degrees Left Elbow Flexion: 100 Left Elbow Extension: 0 Left Wrist Extension: 0 Degrees Left Wrist Flexion: 25 Degrees Left Thumb Opposition: Digit 2;Digit 3;Digit 4 LUE Strength LUE Overall Strength Comments: Unable to sustain position against gravity, grossly 3-/5 RLE Assessment RLE Assessment: Within Functional Limits LLE Assessment LLE Assessment: Exceptions to [facility] LLE Strength LLE Overall Strength: Deficits LLE Overall Strength Comments: hip flexion, knee flexion and ankle DF 3/5; knee extension 4/5 but poor functional endurance  See FIM for current functional status  Raylene Everts [facility] 06/29/2013, 5:47 PM

## 2013-06-29 NOTE — Progress Notes (Signed)
Social Work Patient ID: Donald [name], male   DOB: September 30, 1945, 68 y.o.   MRN: [id number] Met with pt who reports he does not need the hospital bed but does need the drop-arm bsc.  Have contacted [facility] to confirm order.  Will be delivered today. Wife to be in for more family education, along with daughter.  Working toward discharge tomorrow.

## 2013-06-29 NOTE — Patient Care Conference (Signed)
Inpatient RehabilitationTeam Conference and Plan of Care Update Date: 06/29/2013   Time: 11:50 AM    Patient Name: Donald [name]      Medical Record Number: [id number]  Date of Birth: 02-29-1946 Sex: Male         Room/Bed: 6Q94T/6L46T-03 Payor Info: Payor: MEDICARE / Plan: MEDICARE PART A AND B / Product Type: *No Product type* /    Admitting Diagnosis: CVA  Admit Date/Time:  06/07/2013  6:02 PM Admission Comments: No comment available   Primary Diagnosis:  CVA (cerebral infarction) Principal Problem: CVA (cerebral infarction)  Patient Active Problem List   Diagnosis Date Noted  . Small vessel disease 06/07/2013  . Obesity, unspecified 06/07/2013  . CVA (cerebral infarction) 06/07/2013  . large right middle cerebral artery infarct, embolic 54/65/6812  . Hypertension 06/03/2013  . Diabetes 06/03/2013    Expected Discharge Date: Expected Discharge Date: 06/30/13  Team Members Present: Physician leading conference: Dr. Alysia Penna Social Worker Present: Ovidio Kin, LCSW;Jenny Prevatt, LCSW Nurse Present: Other (comment) (Ashely Royal-RN) PT Present: Raylene Everts, Jimmie Molly, PT OT Present: Simonne Come, OT;Kris Nira Retort, OT SLP Present: Gunnar Fusi, SLP PPS Coordinator present : Daiva Nakayama, RN, CRRN     Current Status/Progress Goal Weekly Team Focus  Medical   HTN systolic, CBGs ok  establish home BP regimen  med adjustment   Bowel/Bladder   continent of bladder and bowel LBM 06/27/13  Mod I  Continue plan of care   Swallow/Nutrition/ Hydration     WFL        ADL's   min assist bathing at sit > stand level, supervision UB dressing, min assist LB dressing, min assist squat pivot transfers.  Continues to require verbal cues for safety due to impulsivity and decreased proprioception and sensation on Lt side  min assist overall  standing balance, transfer, LUE NM re-ed   Mobility   Goals met  Mod I w/c mobiilty, supervision bed mobility, min  A transfers and car transfer, min A gait with therapy only  Family education and transfers for D/C   Communication     [facility]        Safety/Cognition/ Behavioral Observations    No safety concerns        Pain   No c/o pain   less than 3  Monitor pain q shift    Skin   Left skin tear to elbow  No skin breakdown  Assess skin q shift for breakdown      *See Care Plan and progress notes for long and short-term goals.  Barriers to Discharge: left neglect    Possible Resolutions to Barriers:  D/C planning for 1/29 D/C    Discharge Planning/Teaching Needs:  Home with wife who is in the process of family education and preparing for discharge tomorrow.      Team Discussion:  Will not need insulin at home.  Finishing up family education today. Adjusting BP meds today.  Revisions to Treatment Plan:  Ready for d/c   Continued Need for Acute Rehabilitation Level of Care: The patient requires daily medical management by a physician with specialized training in physical medicine and rehabilitation for the following conditions: Daily direction of a multidisciplinary physical rehabilitation program to ensure safe treatment while eliciting the highest outcome that is of practical value to the patient.: Yes Daily medical management of patient stability for increased activity during participation in an intensive rehabilitation regime.: Yes Daily analysis of laboratory values and/or radiology reports with any  subsequent need for medication adjustment of medical intervention for : Neurological problems;Other  [name] 06/29/2013, 4:07 PM

## 2013-06-29 NOTE — Progress Notes (Signed)
Social Work Patient ID: Donald [name], male   DOB: 11/16/1945, 67 y.o.   MRN: [id number] Met with pt and wife who report ready for discharge tomorrow.  Pt feels more tired from switch in blood pressure med.  Will switch out wheelchair since wrong one Delivered to room.  Follow up arranaged and ready for tomorrow. 

## 2013-06-29 NOTE — Progress Notes (Signed)
Social Work [name], LCSW Social Worker Signed  Patient Care Conference Service date: 06/29/2013 4:07 PM  Inpatient RehabilitationTeam Conference and Plan of Care Update Date: 06/29/2013   Time: 11:50 AM     Patient Name: Donald [name]       Medical Record Number: [id number]   Date of Birth: 14-Jan-1946 Sex: Male         Room/Bed: 9Y92K/4Q28M-38 Payor Info: Payor: MEDICARE / Plan: MEDICARE PART A AND B / Product Type: *No Product type* /   Admitting Diagnosis: CVA   Admit Date/Time:  06/07/2013  6:02 PM Admission Comments: No comment available   Primary Diagnosis:  CVA (cerebral infarction) Principal Problem: CVA (cerebral infarction)    Patient Active Problem List     Diagnosis  Date Noted   .  Small vessel disease  06/07/2013   .  Obesity, unspecified  06/07/2013   .  CVA (cerebral infarction)  06/07/2013   .  [name] right middle cerebral artery infarct, embolic  17/71/1657   .  Hypertension  06/03/2013   .  Diabetes  06/03/2013     Expected Discharge Date: Expected Discharge Date: 06/30/13  Team Members Present: Physician leading conference: Dr. Alysia Penna Social Worker Present: Ovidio Kin, LCSW;Jenny Prevatt, LCSW Nurse Present: Other (comment) (Ashely Royal-RN) PT Present: Raylene Everts, Jimmie Molly, PT OT Present: Simonne Come, OT;Kris Nira Retort, OT SLP Present: Gunnar Fusi, SLP PPS Coordinator present : Daiva Nakayama, RN, CRRN        Current Status/Progress  Goal  Weekly Team Focus   Medical     HTN systolic, CBGs ok  establish home BP regimen  med adjustment   Bowel/Bladder     continent of bladder and bowel LBM 06/27/13  Mod I  Continue plan of care   Swallow/Nutrition/ Hydration     WFL       ADL's     min assist bathing at sit > stand level, supervision UB dressing, min assist LB dressing, min assist squat pivot transfers.  Continues to require verbal cues for safety due to impulsivity and decreased proprioception and  sensation on Lt side  min assist overall  standing balance, transfer, LUE NM re-ed   Mobility     Goals met  Mod I w/c mobiilty, supervision bed mobility, min A transfers and car transfer, min A gait with therapy only  Family education and transfers for D/C   Communication     [facility]       Safety/Cognition/ Behavioral Observations    No safety concerns       Pain     No c/o pain   less than 3  Monitor pain q shift    Skin     Left skin tear to elbow  No skin breakdown  Assess skin q shift for breakdown     *See Care Plan and progress notes for long and short-term goals.    Barriers to Discharge:  left neglect      Possible Resolutions to Barriers:    D/C planning for 1/29 D/C      Discharge Planning/Teaching Needs:    Home with wife who is in the process of family education and preparing for discharge tomorrow.      Team Discussion:    Will not need insulin at home.  Finishing up family education today. Adjusting BP meds today.   Revisions to Treatment Plan:    Ready for d/c    Continued Need for Acute Rehabilitation  Level of Care: The patient requires daily medical management by a physician with specialized training in physical medicine and rehabilitation for the following conditions: Daily direction of a multidisciplinary physical rehabilitation program to ensure safe treatment while eliciting the highest outcome that is of practical value to the patient.: Yes Daily medical management of patient stability for increased activity during participation in an intensive rehabilitation regime.: Yes Daily analysis of laboratory values and/or radiology reports with any subsequent need for medication adjustment of medical intervention for : Neurological problems;Other  [name] 06/29/2013, 4:07 PM         [name], LCSW Social Worker Signed  Patient Care Conference Service date: 06/22/2013 3:26 PM  Inpatient RehabilitationTeam Conference and Plan of Care Update Date:  06/22/2013   Time: 12;05 PM     Patient Name: Donald [name]       Medical Record Number: [id number]   Date of Birth: Nov 21, 1945 Sex: Male         Room/Bed: 4U98J/1B14N-82 Payor Info: Payor: MEDICARE / Plan: MEDICARE PART A AND B / Product Type: *No Product type* /   Admitting Diagnosis: CVA   Admit Date/Time:  06/07/2013  6:02 PM Admission Comments: No comment available   Primary Diagnosis:  CVA (cerebral infarction) Principal Problem: CVA (cerebral infarction)    Patient Active Problem List     Diagnosis  Date Noted   .  Small vessel disease  06/07/2013   .  Obesity, unspecified  06/07/2013   .  CVA (cerebral infarction)  06/07/2013   .  [name] right middle cerebral artery infarct, embolic  95/62/1308   .  Hypertension  06/03/2013   .  Diabetes  06/03/2013     Expected Discharge Date: Expected Discharge Date: 06/30/13  Team Members Present: Physician leading conference: Dr. Alysia Penna Social Worker Present: Ovidio Kin, LCSW Nurse Present: Heather Roberts, RN PT Present: Raylene Everts, PT;Emily Rinaldo Cloud, PT OT Present: Simonne Come, OT;Kris Gellert, Maryella Shivers, OT SLP Present: Gunnar Fusi, SLP PPS Coordinator present : Daiva Nakayama, RN, CRRN        Current Status/Progress  Goal  Weekly Team Focus   Medical     Activity tolerance improving, increasing movement left side, morning systolic hypertension  Improved blood pressure prior to discharge, improve functional status to allow for home discharge  Adjust medications   Bowel/Bladder     continent bowel and bladder LBM 1-19   mod I  continue with plan of care    Swallow/Nutrition/ Hydration     [facility]       ADL's     min assist bathing at sit > stand level, min-supervision UB dressing, min-mod assist LB dressing, min-max assist squat pivot transfers. Pt requires max cues due to impulsivity and decreased proprioception in Lt side  min assist overall  standing balance, transfers, LUE NM re-ed   Mobility      Supervision squat pivot transfers and w/c mobility with cues for safety, mod-max A gait with RW short distances and with AFO, stairs with lift equipment  Min A for transfers and gait short distances, mod  A stair negotiation, supervision w/c mobility  standing balance, gait, stairs, stand pivot transfers   Communication     [facility]       Safety/Cognition/ Behavioral Observations    aware of deficits on left side  calls for assist independently   mod I   continue to educate of safety    Pain     tylenol 650  mg po every 4 hours prn headache   less than 3   monitor for pain control    Skin     left skin tear to elbow tegaderm intact . tegaderm covering left chest loop recorder   no new breakdown   educate on skin care      *See Care Plan and progress notes for long and short-term goals.    Barriers to Discharge:  Left neglect      Possible Resolutions to Barriers:    Continue rehabilitation, and see above      Discharge Planning/Teaching Needs:    Home with wife who may need to begin coming in for education-since much will be needed and to make sure comfortable with his care      Team Discussion:    Adjusting BP meds, learning diabetic diet.  Beginning to ambulate with PT.  Need AFO, will begin family education with wife.  Wife to measure doorways in daughter's home   Revisions to Treatment Plan:    None    Continued Need for Acute Rehabilitation Level of Care: The patient requires daily medical management by a physician with specialized training in physical medicine and rehabilitation for the following conditions: Daily direction of a multidisciplinary physical rehabilitation program to ensure safe treatment while eliciting the highest outcome that is of practical value to the patient.: Yes Daily medical management of patient stability for increased activity during participation in an intensive rehabilitation regime.: Yes Daily analysis of laboratory values and/or radiology reports with  any subsequent need for medication adjustment of medical intervention for : Neurological problems  Joanna Borawski, Gardiner Rhyme 06/22/2013, 3:26 PM         [name], LCSW Social Worker Signed  Patient Care Conference Service date: 06/15/2013 2:21 PM  Inpatient RehabilitationTeam Conference and Plan of Care Update Date: 06/15/2013   Time: 11:30 Am     Patient Name: Donald [name]       Medical Record Number: [id number]   Date of Birth: 1946/04/25 Sex: Male         Room/Bed: 3U20U/5K27C-62 Payor Info: Payor: MEDICARE / Plan: MEDICARE PART A AND B / Product Type: *No Product type* /   Admitting Diagnosis: CVA   Admit Date/Time:  06/07/2013  6:02 PM Admission Comments: No comment available   Primary Diagnosis:  CVA (cerebral infarction) Principal Problem: CVA (cerebral infarction)    Patient Active Problem List     Diagnosis  Date Noted   .  Small vessel disease  06/07/2013   .  Obesity, unspecified  06/07/2013   .  CVA (cerebral infarction)  06/07/2013   .  [name] right middle cerebral artery infarct, embolic  37/62/8315   .  Hypertension  06/03/2013   .  Diabetes  06/03/2013     Expected Discharge Date: Expected Discharge Date: 06/30/13  Team Members Present: Physician leading conference: Dr. Alysia Penna Social Worker Present: Ovidio Kin, LCSW Nurse Present: Nanine Means, RN PT Present: Raylene Everts, PT;Emily Rinaldo Cloud, PT OT Present: Simonne Come, Starling Manns, Maryella Shivers, OT SLP Present: Gunnar Fusi, SLP PPS Coordinator present : Daiva Nakayama, RN, CRRN        Current Status/Progress  Goal  Weekly Team Focus   Medical     trnaferring better , occ incont, poor awareness of deficits  100% cont  avoid bedpan use   Bowel/Bladder     continent of bowel and bladder LBM 1/14  mod I  continue to monitor patient   Swallow/Nutrition/  Hydration     regular textures and thin liquids  Mod I  education copleted and goals met   ADL's     mod assist bathing at bed  level, +2 at sit > stand level, mod assist UB dressing, mod assist LB dressing +2 to pull up pants, min assist squat pivot to Rt, max assist to Lt  min assist overall  standing balance, transfers, LB dressing, LUE NM re-ed   Mobility     Supervision transfers, min A w/c mobility, +2 standing, +2 gait with [place] and [place] walker  Min A overall  Standing balance, gait, LE NMR, safety awarness/attention to L   Communication     Mod I- Independent  Mod I  education completed and goals met   Safety/Cognition/ Behavioral Observations    No safety concerns       Pain     Tylenol 650 mg every 4 hours prn  less than 3   monitor for effectiveness   Skin     no breakdown noted  no breakdown   monitor patient and continue to educate patient     *See Care Plan and progress notes for long and short-term goals.    Barriers to Discharge:  left neglect      Possible Resolutions to Barriers:    cont rehab, coordinate PT/OT RN      Discharge Planning/Teaching Needs:    Home with wife who can provide care, will need family education prior to discharge.       Team Discussion:    Making good progress-neglect showing more this week, running into things on his left.  Use bsc and bathroom instead of bedpan, voids better.  Pt directing his care.   Revisions to Treatment Plan:    D/C speech met goals    Continued Need for Acute Rehabilitation Level of Care: The patient requires daily medical management by a physician with specialized training in physical medicine and rehabilitation for the following conditions: Daily direction of a multidisciplinary physical rehabilitation program to ensure safe treatment while eliciting the highest outcome that is of practical value to the patient.: Yes Daily medical management of patient stability for increased activity during participation in an intensive rehabilitation regime.: Yes Daily analysis of laboratory values and/or radiology reports with any subsequent need for  medication adjustment of medical intervention for : Neurological problems  Vignesh Willert, Gardiner Rhyme 06/16/2013, 2:40 PM         [name], [place] Worker Signed  Patient Care Conference Service date: 06/08/2013 1:42 PM  Inpatient RehabilitationTeam Conference and Plan of Care Update Date: 06/08/2013   Time: 11;15 am     Patient Name: Donald [name]       Medical Record Number: [id number]   Date of Birth: 12/19/1945 Sex: Male         Room/Bed: 4F27M/1Y70L-29 Payor Info: Payor: MEDICARE / Plan: MEDICARE PART A AND B / Product Type: *No Product type* /   Admitting Diagnosis: CVA   Admit Date/Time:  06/07/2013  6:02 PM Admission Comments: No comment available   Primary Diagnosis:  CVA (cerebral infarction) Principal Problem: CVA (cerebral infarction)    Patient Active Problem List     Diagnosis  Date Noted   .  Small vessel disease  06/07/2013   .  Obesity, unspecified  06/07/2013   .  CVA (cerebral infarction)  06/07/2013   .  [name] right middle cerebral artery infarct, embolic  57/47/3403   .  Hypertension  06/03/2013   .  Diabetes  06/03/2013     Expected Discharge Date: Expected Discharge Date: 06/30/13  Team Members Present: Physician leading conference: Dr. Alysia Penna Social Worker Present: Ovidio Kin, LCSW Nurse Present: Heather Roberts, RN PT Present: Raylene Everts, PT;Emily Parcell, PT OT Present: Antony Salmon, Jules Schick, OT PPS Coordinator present : Daiva Nakayama, RN, CRRN        Current Status/Progress  Goal  Weekly Team Focus   Medical     Constipation, severe weakness in RUE adn RLE as wellas Poor sensation  upgrae strength, regulate bowels  initiate therapy program, adjust meds   Bowel/Bladder     continent bowel and bladder loose bm this am LBM 1-07  mod I  monitor    Swallow/Nutrition/ Hydration     Dys 3 textures and thin liquids, intermittent supervision  Mod I  trials of regular textures, increase utilization of swallowing strategies   ADL's      max assist bathing and UB dressing, +2 LB dressing and squat pivot transfer to toilet, max assist sit <> stand  min assist overall  sitting and standing balance, transfers, self-care retraining   Mobility     Min-mod A sitting balance, max A bed mobility, +2 transfers  Min A overall  Sitting balance, transfers, standing, LE NMR    Communication     mildly dysarthric speech  Mod I  utilization of speech intelligibility strategies   Safety/Cognition/ Behavioral Observations    No unsafe behaviors       Pain     tylenol 650 mg po prn every 4 hous   less than 3  monitor effectiveness of pain meds    Skin     dressing to loop recorder   now new breakdown   monitor skin and educate family on skin care      *See Care Plan and progress notes for long and short-term goals.    Barriers to Discharge:  heavy assist level, [name] pt      Possible Resolutions to Barriers:    cont rehab to upgrade to min A      Discharge Planning/Teaching Needs:    Home with wife providing the care-re-locating here and are making arrangements of where and the plan at discharge.    Team Discussion:    Goals-min level-poor sensation, r-gaze pref.  Loop recorder placed yesterday.  Evaluating in all areas   Revisions to Treatment Plan:    New eval    Continued Need for Acute Rehabilitation Level of Care: The patient requires daily medical management by a physician with specialized training in physical medicine and rehabilitation for the following conditions: Daily direction of a multidisciplinary physical rehabilitation program to ensure safe treatment while eliciting the highest outcome that is of practical value to the patient.: Yes Daily medical management of patient stability for increased activity during participation in an intensive rehabilitation regime.: Yes Daily analysis of laboratory values and/or radiology reports with any subsequent need for medication adjustment of medical intervention for :  Neurological problems;Other  [name] 06/10/2013, 8:38 AM          Patient ID: Donald [name], male   DOB: July 19, 1945, 68 y.o.   MRN: [id number]

## 2013-06-29 NOTE — Progress Notes (Signed)
Occupational Therapy Session Note  Patient Details  Name: Donald [name] MRN: [id number] Date of Birth: 04-26-1946  Today's Date: 06/29/2013 Time: 8099-8338 and 2505-3976 Time Calculation (min): 60 min and 30 min  Short Term Goals: Week 3:  OT Short Term Goal 1 (Week 3): Pt will complete toilet transfer with min assist 5/5 opportunities OT Short Term Goal 2 (Week 3): Pt will complete transfer to tub bench with min assist of one caregiver with fewer than 25% cues for safety and sequencing OT Short Term Goal 3 (Week 3): Pt will complete toileting (hygiene and clothing management) with min assist OT Short Term Goal 4 (Week 3): Pt will complete LB dressing with min assist OT Short Term Goal 5 (Week 3): Pt will utilize LUE as gross assist to complete grooming tasks.  Skilled Therapeutic Interventions/Progress Updates:    1) Pt seen for hands on family education with wife with focus on transfers and self-care tasks.  Engaged in bathing and dressing in ADL apartment with pt's wife providing assist with transfers and sit <> stand for LB dressing.  Pt's wife demonstrated appropriate cues at the necessary times to increase pt safety with mobility and ensure proper alignment of LLE prior to any mobility.  Discussed DME to be issued prior to d/c to allow for safety and increased independence.  2) Engaged in [place] re-ed with focus on LUE ROM and [place].  Pt demonstrating increased shoulder flexion with approx 85 degrees in supported sitting but unable to maintain against gravity.  Engaged in [facility] with focus on finger abduction, tapping on table, and finger translation.  Pt reports unsettled stomach and requested to return to room.  Upon arrival, reports moment passed, finished discussion with wife regarding education and any further questions to which they both report no further questions at this time.  Therapy Documentation Precautions:  Precautions Precautions: Fall Precaution Comments: LUE and LLE  hemiparesis Restrictions Weight Bearing Restrictions: No General:   Vital Signs: Therapy Vitals Pulse Rate: 70 BP: 170/87 mmHg Pain:  Pt with no c/o pain  See FIM for current functional status  Therapy/Group: Individual Therapy  Simonne Come 06/29/2013, 11:57 AM

## 2013-06-30 ENCOUNTER — Ambulatory Visit: Payer: Medicare Other

## 2013-06-30 LAB — GLUCOSE, CAPILLARY
Glucose-Capillary: 132 mg/dL — ABNORMAL HIGH (ref 70–99)
Glucose-Capillary: 160 mg/dL — ABNORMAL HIGH (ref 70–99)
Glucose-Capillary: 113 mg/dL — ABNORMAL HIGH (ref 70–99)
Glucose-Capillary: 107 mg/dL — ABNORMAL HIGH (ref 70–99)

## 2013-06-30 MED ORDER — ISOSORB DINITRATE-HYDRALAZINE 20-37.5 MG PO TABS: 1.0000 | ORAL_TABLET | Freq: Two times a day (BID) | ORAL | Status: DC

## 2013-06-30 MED ORDER — CLOPIDOGREL BISULFATE 75 MG PO TABS: 75.0000 mg | ORAL_TABLET | Freq: Every day | ORAL | Status: DC

## 2013-06-30 MED ORDER — PANTOPRAZOLE SODIUM 40 MG PO TBEC: 40.0000 mg | DELAYED_RELEASE_TABLET | Freq: Every day | ORAL | Status: DC

## 2013-06-30 MED ORDER — AMLODIPINE BESYLATE 10 MG PO TABS: 10.0000 mg | ORAL_TABLET | Freq: Every day | ORAL | Status: AC

## 2013-06-30 MED ORDER — LISINOPRIL 40 MG PO TABS: 40.0000 mg | ORAL_TABLET | Freq: Every day | ORAL | Status: AC

## 2013-06-30 MED ORDER — METFORMIN HCL ER 750 MG PO TB24: 1500.0000 mg | ORAL_TABLET | Freq: Every day | ORAL | Status: DC

## 2013-06-30 MED ORDER — HYDRALAZINE HCL 10 MG PO TABS
10.0000 mg | ORAL_TABLET | Freq: Three times a day (TID) | ORAL | Status: DC
  Filled 2013-06-30 (×3): qty 1

## 2013-06-30 MED ORDER — HYDRALAZINE HCL 10 MG PO TABS: 10.0000 mg | ORAL_TABLET | Freq: Three times a day (TID) | ORAL | Status: DC

## 2013-06-30 MED ORDER — CLONIDINE HCL 0.3 MG PO TABS: 0.3000 mg | ORAL_TABLET | Freq: Three times a day (TID) | ORAL | Status: DC

## 2013-06-30 MED ORDER — CARVEDILOL 25 MG PO TABS: 25.0000 mg | ORAL_TABLET | Freq: Two times a day (BID) | ORAL | Status: DC

## 2013-06-30 MED ORDER — POTASSIUM CHLORIDE CRYS ER 20 MEQ PO TBCR: 20.0000 meq | EXTENDED_RELEASE_TABLET | Freq: Every day | ORAL | Status: DC

## 2013-06-30 MED ORDER — HYDRALAZINE HCL 10 MG PO TABS
10.0000 mg | ORAL_TABLET | Freq: Three times a day (TID) | ORAL | Status: DC
  Filled 2013-06-30 (×4): qty 1

## 2013-06-30 MED ORDER — GLYBURIDE 5 MG PO TABS: 5.0000 mg | ORAL_TABLET | Freq: Every day | ORAL | Status: DC

## 2013-06-30 NOTE — Progress Notes (Addendum)
68 y.o. right-handed male with history of hypertension as well as diabetes mellitus. Patient is from [place] and was visiting family in [place]. Admitted 06/03/2013 of left-sided weakness and slurred speech. 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 Echocardiogram with ejection fraction 12% grade 1 diastolic dysfunction. CT angiogram head and neck with no proximal branch occlusion or stenosis. Patient did receive TPA. Neurology services consulted placed on Plavix for CVA prophylaxis. TEE showed no PFO or thrombus loop recorder just implanted to rule out arrhythmia as potential cause. Acute on chronic paranasal sinusitis identified on MRI of the brain placed on amoxicillin  Subjective/Complaints: Discussed BP meds Review of Systems - Negative except weakness on Left and as above  Objective: Vital Signs: Blood pressure 167/78, pulse 62, temperature 97.8 F (36.6 C), temperature source Oral, resp. rate 18, height 6\' 3"  (1.905 m), weight 118.389 kg (261 lb), SpO2 96.00%. No results found. Results for orders placed during the hospital encounter of 06/07/13 (from the past 72 hour(s))  GLUCOSE, CAPILLARY     Status: Abnormal   Collection Time    06/27/13  7:13 AM      Result Value Range   Glucose-Capillary 153 (*) 70 - 99 mg/dL   Comment 1 Notify RN    GLUCOSE, CAPILLARY     Status: None   Collection Time    06/27/13 12:01 PM      Result Value Range   Glucose-Capillary 74  70 - 99 mg/dL   Comment 1 Notify RN    GLUCOSE, CAPILLARY     Status: None   Collection Time    06/27/13  4:06 PM      Result Value Range   Glucose-Capillary 90  70 - 99 mg/dL   Comment 1 Notify RN    GLUCOSE, CAPILLARY     Status: Abnormal   Collection Time    06/27/13 10:32 PM      Result Value Range   Glucose-Capillary 132 (*) 70 - 99 mg/dL  GLUCOSE, CAPILLARY      Status: Abnormal   Collection Time    06/28/13  7:20 AM      Result Value Range   Glucose-Capillary 137 (*) 70 - 99 mg/dL   Comment 1 Notify RN    GLUCOSE, CAPILLARY     Status: Abnormal   Collection Time    06/28/13 11:20 AM      Result Value Range   Glucose-Capillary 128 (*) 70 - 99 mg/dL   Comment 1 Notify RN    GLUCOSE, CAPILLARY     Status: Abnormal   Collection Time    06/28/13  4:58 PM      Result Value Range   Glucose-Capillary 123 (*) 70 - 99 mg/dL   Comment 1 Notify RN    GLUCOSE, CAPILLARY     Status: Abnormal   Collection Time    06/28/13  9:04 PM      Result Value Range   Glucose-Capillary 143 (*) 70 - 99 mg/dL  GLUCOSE, CAPILLARY     Status: Abnormal   Collection Time    06/29/13  7:27 AM      Result Value Range   Glucose-Capillary 137 (*) 70 - 99 mg/dL   Comment 1 Notify RN    GLUCOSE, CAPILLARY     Status: Abnormal   Collection Time    06/29/13 11:43 AM  Result Value Range   Glucose-Capillary 113 (*) 70 - 99 mg/dL   Comment 1 Notify RN    GLUCOSE, CAPILLARY     Status: Abnormal   Collection Time    06/29/13  4:54 PM      Result Value Range   Glucose-Capillary 102 (*) 70 - 99 mg/dL   Comment 1 Notify RN    GLUCOSE, CAPILLARY     Status: Abnormal   Collection Time    06/29/13  9:19 PM      Result Value Range   Glucose-Capillary 111 (*) 70 - 99 mg/dL     HEENT: normal Cardio: RRR and no murmur Resp: CTA B/L and unlabored GI: BS positive and non distended Extremity:  Pulses positive and No Edema Skin:   Intact except R med hand abrasion, no drainage, left elbow normal, loop recorder incision healing well Neuro: Alert/Oriented, Cranial Nerve Abnormalities left central 7, Abnormal Sensory absent left hand and foot, Abnormal Motor 3- Left shoulder abd, add, Bi, Tri and Thumb ext,3-/5 finger #2 and 3ext 3- L Hip Add, 3-/5 hip flexion, 3-/5 KE, trace toe flexion, Dysarthric and Other poor lip closure on Left, very poor motor control on Left Musc/Skel:   Other Left shoulder sublux Gen NAD   Assessment/Plan: 1. Functional deficits secondary to R MCA embolic infarct  which require 3+ hours per day of interdisciplinary therapy in a comprehensive inpatient rehab setting. Physiatrist is providing close team supervision and 24 hour management of active medical problems listed below. Physiatrist and rehab team continue to assess barriers to discharge/monitor patient progress toward functional and medical goals. Stable for D/C today F/u PCP in 1-2 weeks F/u PM&R 3 weeks See D/C summary See D/C instructions FIM: FIM - Bathing Bathing Steps Patient Completed: Chest;Right Arm;Left Arm;Abdomen;Right upper leg;Left upper leg;Front perineal area;Right lower leg (including foot);Left lower leg (including foot) Bathing: 4: Min-Patient completes 8-9 27f 10 parts or 75+ percent  FIM - Upper Body Dressing/Undressing Upper body dressing/undressing steps patient completed: Put head through opening of pull over shirt/dress;Pull shirt over trunk;Thread/unthread right sleeve of pullover shirt/dresss;Thread/unthread left sleeve of pullover shirt/dress Upper body dressing/undressing: 5: Set-up assist to: Obtain clothing/put away FIM - Lower Body Dressing/Undressing Lower body dressing/undressing steps patient completed: Thread/unthread right pants leg;Thread/unthread left pants leg;Pull pants up/down;Don/Doff right sock;Don/Doff left sock;Don/Doff right shoe;Don/Doff left shoe;Fasten/unfasten right shoe;Fasten/unfasten left shoe;Thread/unthread right underwear leg;Thread/unthread left underwear leg;Pull underwear up/down Lower body dressing/undressing: 4: Steadying Assist  FIM - Toileting Toileting steps completed by patient: Performs perineal hygiene;Adjust clothing prior to toileting Toileting Assistive Devices: Grab bar or rail for support Toileting: 3: Mod-Patient completed 2 of 3 steps  FIM - Radio producer Devices: Grab  bars Toilet Transfers: 4-To toilet/BSC: Min A (steadying Pt. > 75%);4-From toilet/BSC: Min A (steadying Pt. > 75%)  FIM - Bed/Chair Transfer Bed/Chair Transfer Assistive Devices: Arm rests Bed/Chair Transfer: 5: Bed > Chair or W/C: Supervision (verbal cues/safety issues);5: Chair or W/C > Bed: Supervision (verbal cues/safety issues)  FIM - Locomotion: Wheelchair Distance: 150 Locomotion: Wheelchair: 6: Travels 150 ft or more, turns around, maneuvers to table, bed or toilet, negotiates 3% grade: maneuvers on rugs and over door sills independently FIM - Locomotion: Ambulation Locomotion: Ambulation Assistive Devices: Orthosis;Other (comment) Ambulation/Gait Assistance: 4: Min assist (yesterday) Locomotion: Ambulation: 2: Travels 50 - 149 ft with minimal assistance (Pt.>75%)  Comprehension Comprehension Mode: Auditory Comprehension: 6-Follows complex conversation/direction: With extra time/assistive device  Expression Expression Mode: Verbal Expression: 6-Expresses complex ideas: With  extra time/assistive device  Social Interaction Social Interaction: 6-Interacts appropriately with others with medication or extra time (anti-anxiety, antidepressant).  Problem Solving Problem Solving: 6-Solves complex problems: With extra time  Memory Memory: 6-More than reasonable amt of time  Medical Problem List and Plan:  1. 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 Loop recorder implanted 06/07/13. On plavix 2. DVT Prophylaxis/Anticoagulation: SCDs. Monitor for any signs of DVT  3. Pain Management: Tylenol as needed for  4. Neuropsych: This patient is capable of making decisions on his own behalf.  5. Hypertension. Norvasc 10 mg daily, Coreg 12.5 mg twice a day,increase Catapres 0.3 mg tid, lisinopril 40 mg daily. Monitor with increased mobility, d/c HCTZ and cardura, trial Bidil 6. Diabetes mellitus with peripheral neuropathy.CBGs generally good but elevation this am Hemoglobin A1c 6.2. DiaBeta 5 mg  daily, Glucophage 1500 mg daily. Check blood sugars a.c. and at bedtime. Education by team  Dietary ed for DM and weight loss 7. Sinusitis identified on MRI. , monitor off abx  LOS (Days) 23 A FACE TO FACE EVALUATION WAS PERFORMED  KIRSTEINS,ANDREW E 06/30/2013, 6:18 AM

## 2013-06-30 NOTE — Progress Notes (Signed)
0800- Pt's b/p prior to all AM meds, except Bidil,  165/87, HHR 72;   1020-- B/P checked prior to giving Bidil- 127/64.    1215-- pt. c/o dizziness, "feeling weak."  B/P 104/59, HRR 78 ( sitting). Silvestre Mesi PAC aware. Assisted to recline in recliner chair, after 74mns, B/P 110/60. Pt and wife anxious re: d/c today. D/C held.  Orthostatics checked, no change. Teds applied... B/P ranged 132/70-154/74 rest of shift. No further c/o; monitor.

## 2013-06-30 NOTE — Progress Notes (Signed)
Social Work Patient ID: [name], male   DOB: November 11, 1945, 68 y.o.   MRN: [id number] Met with pt and wife who reports feeling relieved about not going home today with his blood pressure issues. All DME and follow up arranged and correct.  Will await decision tomorrow by MD.

## 2013-07-01 DIAGNOSIS — G8110 Spastic hemiplegia affecting unspecified side: Secondary | ICD-10-CM

## 2013-07-01 DIAGNOSIS — I69991 Dysphagia following unspecified cerebrovascular disease: Secondary | ICD-10-CM

## 2013-07-01 DIAGNOSIS — I6340 Cerebral infarction due to embolism of unspecified cerebral artery: Secondary | ICD-10-CM

## 2013-07-01 LAB — GLUCOSE, CAPILLARY
Glucose-Capillary: 127 mg/dL — ABNORMAL HIGH (ref 70–99)
Glucose-Capillary: 135 mg/dL — ABNORMAL HIGH (ref 70–99)

## 2013-07-01 MED ORDER — HYDRALAZINE HCL 10 MG PO TABS
10.0000 mg | ORAL_TABLET | Freq: Three times a day (TID) | ORAL | Status: DC
  Administered 2013-07-01: 10 mg via ORAL
  Filled 2013-07-01 (×3): qty 1

## 2013-07-01 NOTE — Progress Notes (Signed)
68 y.o. right-handed male with history of hypertension as well as diabetes mellitus. Patient is from [place] and was visiting family in [place]. Admitted 06/03/2013 of left-sided weakness and slurred speech. 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 Echocardiogram with ejection fraction 36% grade 1 diastolic dysfunction. CT angiogram head and neck with no proximal branch occlusion or stenosis. Patient did receive TPA. Neurology services consulted placed on Plavix for CVA prophylaxis. TEE showed no PFO or thrombus loop recorder just implanted to rule out arrhythmia as potential cause. Subjective/Complaints: Orthostasis after bidil, switched to hydralazine BPs never went into abnormal range but was a 40-30mmHg drop Review of Systems - Negative except weakness on Left and as above  Objective: Vital Signs: Blood pressure 161/80, pulse 65, temperature 97.3 F (36.3 C), temperature source Oral, resp. rate 20, height 6\' 3"  (1.905 m), weight 118.389 kg (261 lb), SpO2 98.00%. No results found. Results for orders placed during the hospital encounter of 06/07/13 (from the past 72 hour(s))  GLUCOSE, CAPILLARY     Status: Abnormal   Collection Time    06/28/13  7:20 AM      Result Value Range   Glucose-Capillary 137 (*) 70 - 99 mg/dL   Comment 1 Notify RN    GLUCOSE, CAPILLARY     Status: Abnormal   Collection Time    06/28/13 11:20 AM      Result Value Range   Glucose-Capillary 128 (*) 70 - 99 mg/dL   Comment 1 Notify RN    GLUCOSE, CAPILLARY     Status: Abnormal   Collection Time    06/28/13  4:58 PM      Result Value Range   Glucose-Capillary 123 (*) 70 - 99 mg/dL   Comment 1 Notify RN    GLUCOSE, CAPILLARY     Status: Abnormal   Collection Time    06/28/13  9:04 PM      Result Value Range   Glucose-Capillary 143 (*) 70 - 99 mg/dL  GLUCOSE,  CAPILLARY     Status: Abnormal   Collection Time    06/29/13  7:27 AM      Result Value Range   Glucose-Capillary 137 (*) 70 - 99 mg/dL   Comment 1 Notify RN    GLUCOSE, CAPILLARY     Status: Abnormal   Collection Time    06/29/13 11:43 AM      Result Value Range   Glucose-Capillary 113 (*) 70 - 99 mg/dL   Comment 1 Notify RN    GLUCOSE, CAPILLARY     Status: Abnormal   Collection Time    06/29/13  4:54 PM      Result Value Range   Glucose-Capillary 102 (*) 70 - 99 mg/dL   Comment 1 Notify RN    GLUCOSE, CAPILLARY     Status: Abnormal   Collection Time    06/29/13  9:19 PM      Result Value Range   Glucose-Capillary 111 (*) 70 - 99 mg/dL  GLUCOSE, CAPILLARY     Status: Abnormal   Collection Time    06/30/13  7:17 AM      Result Value Range   Glucose-Capillary 132 (*) 70 - 99 mg/dL   Comment 1 Notify RN    GLUCOSE, CAPILLARY     Status: Abnormal   Collection Time    06/30/13 11:35 AM  Result Value Range   Glucose-Capillary 160 (*) 70 - 99 mg/dL   Comment 1 Notify RN    GLUCOSE, CAPILLARY     Status: Abnormal   Collection Time    06/30/13  5:11 PM      Result Value Range   Glucose-Capillary 113 (*) 70 - 99 mg/dL   Comment 1 Notify RN    GLUCOSE, CAPILLARY     Status: Abnormal   Collection Time    06/30/13  8:39 PM      Result Value Range   Glucose-Capillary 107 (*) 70 - 99 mg/dL     HEENT: normal Cardio: RRR and no murmur Resp: CTA B/L and unlabored GI: BS positive and non distended Extremity:  Pulses positive and No Edema Skin:   Intact except R med hand abrasion, no drainage, left elbow normal, loop recorder incision healing well Neuro: Alert/Oriented, Cranial Nerve Abnormalities left central 7, Abnormal Sensory absent left hand and foot, Abnormal Motor 3- Left shoulder abd, add, Bi, Tri and Thumb ext,3-/5 finger #2 and 3ext 3- L Hip Add, 3-/5 hip flexion, 3-/5 KE, trace toe flexion, Dysarthric and Other poor lip closure on Left, very poor motor control on  Left Musc/Skel:  Other Left shoulder sublux Gen NAD   Assessment/Plan: 1. Functional deficits secondary to R MCA embolic infarct  which require 3+ hours per day of interdisciplinary therapy in a comprehensive inpatient rehab setting. Physiatrist is providing close team supervision and 24 hour management of active medical problems listed below. Physiatrist and rehab team continue to assess barriers to discharge/monitor patient progress toward functional and medical goals. Stable for D/C today if orthostatics 1 hour after hydralazine are ok, parameter written F/u PCP in 1-2 weeks F/u Cardiology, loop recorder F/u PM&R 3 weeks See D/C summary See D/C instructions FIM: FIM - Bathing Bathing Steps Patient Completed: Chest;Right Arm;Left Arm;Abdomen;Right upper leg;Left upper leg;Front perineal area;Right lower leg (including foot);Left lower leg (including foot) Bathing: 4: Min-Patient completes 8-9 63f 10 parts or 75+ percent  FIM - Upper Body Dressing/Undressing Upper body dressing/undressing steps patient completed: Put head through opening of pull over shirt/dress;Pull shirt over trunk;Thread/unthread right sleeve of pullover shirt/dresss;Thread/unthread left sleeve of pullover shirt/dress Upper body dressing/undressing: 5: Set-up assist to: Obtain clothing/put away FIM - Lower Body Dressing/Undressing Lower body dressing/undressing steps patient completed: Thread/unthread right pants leg;Thread/unthread left pants leg;Pull pants up/down;Don/Doff right sock;Don/Doff left sock;Don/Doff right shoe;Don/Doff left shoe;Fasten/unfasten right shoe;Fasten/unfasten left shoe;Thread/unthread right underwear leg;Thread/unthread left underwear leg;Pull underwear up/down Lower body dressing/undressing: 4: Steadying Assist  FIM - Toileting Toileting steps completed by patient: Performs perineal hygiene;Adjust clothing prior to toileting Toileting Assistive Devices: Grab bar or rail for  support Toileting: 3: Mod-Patient completed 2 of 3 steps  FIM - Radio producer Devices: Grab bars Toilet Transfers: 4-To toilet/BSC: Min A (steadying Pt. > 75%);4-From toilet/BSC: Min A (steadying Pt. > 75%)  FIM - Bed/Chair Transfer Bed/Chair Transfer Assistive Devices: Arm rests Bed/Chair Transfer: 5: Bed > Chair or W/C: Supervision (verbal cues/safety issues);5: Chair or W/C > Bed: Supervision (verbal cues/safety issues)  FIM - Locomotion: Wheelchair Distance: 150 Locomotion: Wheelchair: 6: Travels 150 ft or more, turns around, maneuvers to table, bed or toilet, negotiates 3% grade: maneuvers on rugs and over door sills independently FIM - Locomotion: Ambulation Locomotion: Ambulation Assistive Devices: Orthosis;Other (comment) Ambulation/Gait Assistance: 4: Min assist (yesterday) Locomotion: Ambulation: 2: Travels 50 - 149 ft with minimal assistance (Pt.>75%)  Comprehension Comprehension Mode: Auditory Comprehension: 6-Follows complex conversation/direction:  With extra time/assistive device  Expression Expression Mode: Verbal Expression: 6-Expresses complex ideas: With extra time/assistive device  Social Interaction Social Interaction: 6-Interacts appropriately with others with medication or extra time (anti-anxiety, antidepressant).  Problem Solving Problem Solving: 6-Solves complex problems: With extra time  Memory Memory: 6-More than reasonable amt of time  Medical Problem List and Plan:  1. 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 Loop recorder implanted 06/07/13. On plavix 2. DVT Prophylaxis/Anticoagulation: SCDs. Monitor for any signs of DVT  3. Pain Management: Tylenol as needed for  4. Neuropsych: This patient is capable of making decisions on his own behalf.  5. Hypertension. Norvasc 10 mg daily, Coreg 12.5 mg twice a day,increase Catapres 0.3 mg tid, lisinopril 40 mg daily. Monitor with increased mobility, d/c Bidil, change to  hydralazine 6. Diabetes mellitus with peripheral neuropathy.CBGs generally good but elevation this am Hemoglobin A1c 6.2. DiaBeta 5 mg daily, Glucophage 1500 mg daily. Check blood sugars a.c. and at bedtime. Education by team  Dietary ed for DM and weight loss   LOS (Days) 24 A FACE TO FACE EVALUATION WAS PERFORMED  Edlin Ford E 07/01/2013, 6:19 AM

## 2013-07-04 DIAGNOSIS — R5383 Other fatigue: Secondary | ICD-10-CM | POA: Diagnosis not present

## 2013-07-04 DIAGNOSIS — G909 Disorder of the autonomic nervous system, unspecified: Secondary | ICD-10-CM | POA: Diagnosis not present

## 2013-07-04 DIAGNOSIS — R5381 Other malaise: Secondary | ICD-10-CM | POA: Diagnosis not present

## 2013-07-04 DIAGNOSIS — I10 Essential (primary) hypertension: Secondary | ICD-10-CM | POA: Diagnosis not present

## 2013-07-04 DIAGNOSIS — R269 Unspecified abnormalities of gait and mobility: Secondary | ICD-10-CM | POA: Diagnosis not present

## 2013-07-04 DIAGNOSIS — I69959 Hemiplegia and hemiparesis following unspecified cerebrovascular disease affecting unspecified side: Secondary | ICD-10-CM | POA: Diagnosis not present

## 2013-07-04 DIAGNOSIS — E1149 Type 2 diabetes mellitus with other diabetic neurological complication: Secondary | ICD-10-CM | POA: Diagnosis not present

## 2013-07-05 ENCOUNTER — Ambulatory Visit (INDEPENDENT_AMBULATORY_CARE_PROVIDER_SITE_OTHER): Payer: Medicare Other | Admitting: *Deleted

## 2013-07-05 DIAGNOSIS — I6340 Cerebral infarction due to embolism of unspecified cerebral artery: Secondary | ICD-10-CM | POA: Diagnosis not present

## 2013-07-05 DIAGNOSIS — R269 Unspecified abnormalities of gait and mobility: Secondary | ICD-10-CM | POA: Diagnosis not present

## 2013-07-05 DIAGNOSIS — R5381 Other malaise: Secondary | ICD-10-CM | POA: Diagnosis not present

## 2013-07-05 DIAGNOSIS — R5383 Other fatigue: Secondary | ICD-10-CM | POA: Diagnosis not present

## 2013-07-05 DIAGNOSIS — I69959 Hemiplegia and hemiparesis following unspecified cerebrovascular disease affecting unspecified side: Secondary | ICD-10-CM | POA: Diagnosis not present

## 2013-07-05 DIAGNOSIS — I639 Cerebral infarction, unspecified: Secondary | ICD-10-CM

## 2013-07-05 DIAGNOSIS — I10 Essential (primary) hypertension: Secondary | ICD-10-CM | POA: Diagnosis not present

## 2013-07-05 DIAGNOSIS — I6350 Cerebral infarction due to unspecified occlusion or stenosis of unspecified cerebral artery: Secondary | ICD-10-CM | POA: Diagnosis not present

## 2013-07-05 DIAGNOSIS — E1149 Type 2 diabetes mellitus with other diabetic neurological complication: Secondary | ICD-10-CM | POA: Diagnosis not present

## 2013-07-05 DIAGNOSIS — G909 Disorder of the autonomic nervous system, unspecified: Secondary | ICD-10-CM | POA: Diagnosis not present

## 2013-07-06 DIAGNOSIS — G909 Disorder of the autonomic nervous system, unspecified: Secondary | ICD-10-CM | POA: Diagnosis not present

## 2013-07-06 DIAGNOSIS — R5381 Other malaise: Secondary | ICD-10-CM | POA: Diagnosis not present

## 2013-07-06 DIAGNOSIS — E1149 Type 2 diabetes mellitus with other diabetic neurological complication: Secondary | ICD-10-CM | POA: Diagnosis not present

## 2013-07-06 DIAGNOSIS — R269 Unspecified abnormalities of gait and mobility: Secondary | ICD-10-CM | POA: Diagnosis not present

## 2013-07-06 DIAGNOSIS — I10 Essential (primary) hypertension: Secondary | ICD-10-CM | POA: Diagnosis not present

## 2013-07-06 DIAGNOSIS — I69959 Hemiplegia and hemiparesis following unspecified cerebrovascular disease affecting unspecified side: Secondary | ICD-10-CM | POA: Diagnosis not present

## 2013-07-07 ENCOUNTER — Telehealth: Payer: Self-pay | Admitting: Neurology

## 2013-07-07 DIAGNOSIS — I10 Essential (primary) hypertension: Secondary | ICD-10-CM | POA: Diagnosis not present

## 2013-07-07 DIAGNOSIS — R269 Unspecified abnormalities of gait and mobility: Secondary | ICD-10-CM | POA: Diagnosis not present

## 2013-07-07 DIAGNOSIS — G909 Disorder of the autonomic nervous system, unspecified: Secondary | ICD-10-CM | POA: Diagnosis not present

## 2013-07-07 DIAGNOSIS — R5383 Other fatigue: Secondary | ICD-10-CM | POA: Diagnosis not present

## 2013-07-07 DIAGNOSIS — E1149 Type 2 diabetes mellitus with other diabetic neurological complication: Secondary | ICD-10-CM | POA: Diagnosis not present

## 2013-07-07 DIAGNOSIS — R5381 Other malaise: Secondary | ICD-10-CM | POA: Diagnosis not present

## 2013-07-07 DIAGNOSIS — I69959 Hemiplegia and hemiparesis following unspecified cerebrovascular disease affecting unspecified side: Secondary | ICD-10-CM | POA: Diagnosis not present

## 2013-07-07 NOTE — Telephone Encounter (Signed)
Attempted to call and schedule 2 month hospital follow up with [facility]/Dr. Leonie Man but no answer and patient's voicemail was full.

## 2013-07-08 DIAGNOSIS — R269 Unspecified abnormalities of gait and mobility: Secondary | ICD-10-CM | POA: Diagnosis not present

## 2013-07-08 DIAGNOSIS — R5383 Other fatigue: Secondary | ICD-10-CM | POA: Diagnosis not present

## 2013-07-08 DIAGNOSIS — E1149 Type 2 diabetes mellitus with other diabetic neurological complication: Secondary | ICD-10-CM | POA: Diagnosis not present

## 2013-07-08 DIAGNOSIS — R5381 Other malaise: Secondary | ICD-10-CM | POA: Diagnosis not present

## 2013-07-08 DIAGNOSIS — I69959 Hemiplegia and hemiparesis following unspecified cerebrovascular disease affecting unspecified side: Secondary | ICD-10-CM | POA: Diagnosis not present

## 2013-07-08 DIAGNOSIS — I10 Essential (primary) hypertension: Secondary | ICD-10-CM | POA: Diagnosis not present

## 2013-07-08 DIAGNOSIS — G909 Disorder of the autonomic nervous system, unspecified: Secondary | ICD-10-CM | POA: Diagnosis not present

## 2013-07-11 DIAGNOSIS — R5383 Other fatigue: Secondary | ICD-10-CM | POA: Diagnosis not present

## 2013-07-11 DIAGNOSIS — E1149 Type 2 diabetes mellitus with other diabetic neurological complication: Secondary | ICD-10-CM | POA: Diagnosis not present

## 2013-07-11 DIAGNOSIS — I69959 Hemiplegia and hemiparesis following unspecified cerebrovascular disease affecting unspecified side: Secondary | ICD-10-CM | POA: Diagnosis not present

## 2013-07-11 DIAGNOSIS — R269 Unspecified abnormalities of gait and mobility: Secondary | ICD-10-CM | POA: Diagnosis not present

## 2013-07-11 DIAGNOSIS — R5381 Other malaise: Secondary | ICD-10-CM | POA: Diagnosis not present

## 2013-07-11 DIAGNOSIS — I10 Essential (primary) hypertension: Secondary | ICD-10-CM | POA: Diagnosis not present

## 2013-07-11 DIAGNOSIS — G909 Disorder of the autonomic nervous system, unspecified: Secondary | ICD-10-CM | POA: Diagnosis not present

## 2013-07-12 ENCOUNTER — Telehealth: Payer: Self-pay

## 2013-07-12 NOTE — Telephone Encounter (Signed)
[name] from [facility] called to get perimeters on blood pressure in order for PT/OT to continue therapy.

## 2013-07-12 NOTE — Telephone Encounter (Signed)
Systolic <845, diastolic < 364 Systolic > 680

## 2013-07-13 DIAGNOSIS — I10 Essential (primary) hypertension: Secondary | ICD-10-CM | POA: Diagnosis not present

## 2013-07-13 DIAGNOSIS — I6350 Cerebral infarction due to unspecified occlusion or stenosis of unspecified cerebral artery: Secondary | ICD-10-CM | POA: Diagnosis not present

## 2013-07-13 DIAGNOSIS — E119 Type 2 diabetes mellitus without complications: Secondary | ICD-10-CM | POA: Diagnosis not present

## 2013-07-13 DIAGNOSIS — R351 Nocturia: Secondary | ICD-10-CM | POA: Diagnosis not present

## 2013-07-13 DIAGNOSIS — Z09 Encounter for follow-up examination after completed treatment for conditions other than malignant neoplasm: Secondary | ICD-10-CM | POA: Diagnosis not present

## 2013-07-13 NOTE — Telephone Encounter (Signed)
Donald [name] informed about patients blood pressure perimeters Systolic <370, Diastolic <488, Systolic >891.  Patient advised to follow up with PCP.

## 2013-07-14 ENCOUNTER — Ambulatory Visit: Payer: Medicare Other | Admitting: Physical Medicine and Rehabilitation

## 2013-07-14 DIAGNOSIS — I69959 Hemiplegia and hemiparesis following unspecified cerebrovascular disease affecting unspecified side: Secondary | ICD-10-CM | POA: Diagnosis not present

## 2013-07-14 DIAGNOSIS — R5381 Other malaise: Secondary | ICD-10-CM | POA: Diagnosis not present

## 2013-07-14 DIAGNOSIS — E1149 Type 2 diabetes mellitus with other diabetic neurological complication: Secondary | ICD-10-CM | POA: Diagnosis not present

## 2013-07-14 DIAGNOSIS — I10 Essential (primary) hypertension: Secondary | ICD-10-CM | POA: Diagnosis not present

## 2013-07-14 DIAGNOSIS — G909 Disorder of the autonomic nervous system, unspecified: Secondary | ICD-10-CM | POA: Diagnosis not present

## 2013-07-14 DIAGNOSIS — R269 Unspecified abnormalities of gait and mobility: Secondary | ICD-10-CM | POA: Diagnosis not present

## 2013-07-15 DIAGNOSIS — R5383 Other fatigue: Secondary | ICD-10-CM | POA: Diagnosis not present

## 2013-07-15 DIAGNOSIS — R269 Unspecified abnormalities of gait and mobility: Secondary | ICD-10-CM | POA: Diagnosis not present

## 2013-07-15 DIAGNOSIS — I10 Essential (primary) hypertension: Secondary | ICD-10-CM | POA: Diagnosis not present

## 2013-07-15 DIAGNOSIS — G909 Disorder of the autonomic nervous system, unspecified: Secondary | ICD-10-CM | POA: Diagnosis not present

## 2013-07-15 DIAGNOSIS — I69959 Hemiplegia and hemiparesis following unspecified cerebrovascular disease affecting unspecified side: Secondary | ICD-10-CM | POA: Diagnosis not present

## 2013-07-15 DIAGNOSIS — E1149 Type 2 diabetes mellitus with other diabetic neurological complication: Secondary | ICD-10-CM | POA: Diagnosis not present

## 2013-07-15 DIAGNOSIS — R5381 Other malaise: Secondary | ICD-10-CM | POA: Diagnosis not present

## 2013-07-18 DIAGNOSIS — R5383 Other fatigue: Secondary | ICD-10-CM | POA: Diagnosis not present

## 2013-07-18 DIAGNOSIS — I10 Essential (primary) hypertension: Secondary | ICD-10-CM | POA: Diagnosis not present

## 2013-07-18 DIAGNOSIS — R269 Unspecified abnormalities of gait and mobility: Secondary | ICD-10-CM | POA: Diagnosis not present

## 2013-07-18 DIAGNOSIS — E1149 Type 2 diabetes mellitus with other diabetic neurological complication: Secondary | ICD-10-CM | POA: Diagnosis not present

## 2013-07-18 DIAGNOSIS — G909 Disorder of the autonomic nervous system, unspecified: Secondary | ICD-10-CM | POA: Diagnosis not present

## 2013-07-18 DIAGNOSIS — R5381 Other malaise: Secondary | ICD-10-CM | POA: Diagnosis not present

## 2013-07-18 DIAGNOSIS — I69959 Hemiplegia and hemiparesis following unspecified cerebrovascular disease affecting unspecified side: Secondary | ICD-10-CM | POA: Diagnosis not present

## 2013-07-20 DIAGNOSIS — R5381 Other malaise: Secondary | ICD-10-CM | POA: Diagnosis not present

## 2013-07-20 DIAGNOSIS — I10 Essential (primary) hypertension: Secondary | ICD-10-CM | POA: Diagnosis not present

## 2013-07-20 DIAGNOSIS — I69959 Hemiplegia and hemiparesis following unspecified cerebrovascular disease affecting unspecified side: Secondary | ICD-10-CM | POA: Diagnosis not present

## 2013-07-20 DIAGNOSIS — R269 Unspecified abnormalities of gait and mobility: Secondary | ICD-10-CM | POA: Diagnosis not present

## 2013-07-20 DIAGNOSIS — G909 Disorder of the autonomic nervous system, unspecified: Secondary | ICD-10-CM | POA: Diagnosis not present

## 2013-07-20 DIAGNOSIS — E1149 Type 2 diabetes mellitus with other diabetic neurological complication: Secondary | ICD-10-CM | POA: Diagnosis not present

## 2013-07-21 DIAGNOSIS — G909 Disorder of the autonomic nervous system, unspecified: Secondary | ICD-10-CM | POA: Diagnosis not present

## 2013-07-21 DIAGNOSIS — R269 Unspecified abnormalities of gait and mobility: Secondary | ICD-10-CM | POA: Diagnosis not present

## 2013-07-21 DIAGNOSIS — R5383 Other fatigue: Secondary | ICD-10-CM | POA: Diagnosis not present

## 2013-07-21 DIAGNOSIS — I10 Essential (primary) hypertension: Secondary | ICD-10-CM | POA: Diagnosis not present

## 2013-07-21 DIAGNOSIS — I69959 Hemiplegia and hemiparesis following unspecified cerebrovascular disease affecting unspecified side: Secondary | ICD-10-CM | POA: Diagnosis not present

## 2013-07-21 DIAGNOSIS — R5381 Other malaise: Secondary | ICD-10-CM | POA: Diagnosis not present

## 2013-07-21 DIAGNOSIS — E1149 Type 2 diabetes mellitus with other diabetic neurological complication: Secondary | ICD-10-CM | POA: Diagnosis not present

## 2013-07-22 DIAGNOSIS — R5381 Other malaise: Secondary | ICD-10-CM | POA: Diagnosis not present

## 2013-07-22 DIAGNOSIS — R5383 Other fatigue: Secondary | ICD-10-CM | POA: Diagnosis not present

## 2013-07-22 DIAGNOSIS — R269 Unspecified abnormalities of gait and mobility: Secondary | ICD-10-CM | POA: Diagnosis not present

## 2013-07-22 DIAGNOSIS — I69959 Hemiplegia and hemiparesis following unspecified cerebrovascular disease affecting unspecified side: Secondary | ICD-10-CM | POA: Diagnosis not present

## 2013-07-22 DIAGNOSIS — I10 Essential (primary) hypertension: Secondary | ICD-10-CM | POA: Diagnosis not present

## 2013-07-22 DIAGNOSIS — E1149 Type 2 diabetes mellitus with other diabetic neurological complication: Secondary | ICD-10-CM | POA: Diagnosis not present

## 2013-07-22 DIAGNOSIS — G909 Disorder of the autonomic nervous system, unspecified: Secondary | ICD-10-CM | POA: Diagnosis not present

## 2013-07-26 ENCOUNTER — Ambulatory Visit (HOSPITAL_BASED_OUTPATIENT_CLINIC_OR_DEPARTMENT_OTHER): Payer: Medicare Other | Admitting: Physical Medicine & Rehabilitation

## 2013-07-26 ENCOUNTER — Encounter: Payer: Medicare Other | Attending: Physical Medicine & Rehabilitation

## 2013-07-26 ENCOUNTER — Encounter: Payer: Self-pay | Admitting: Physical Medicine & Rehabilitation

## 2013-07-26 VITALS — BP 134/75 | HR 60 | Resp 14 | Ht 75.0 in | Wt 263.0 lb

## 2013-07-26 DIAGNOSIS — G8110 Spastic hemiplegia affecting unspecified side: Secondary | ICD-10-CM | POA: Diagnosis not present

## 2013-07-26 DIAGNOSIS — R414 Neurologic neglect syndrome: Secondary | ICD-10-CM | POA: Insufficient documentation

## 2013-07-26 DIAGNOSIS — I6340 Cerebral infarction due to embolism of unspecified cerebral artery: Secondary | ICD-10-CM

## 2013-07-26 DIAGNOSIS — I69959 Hemiplegia and hemiparesis following unspecified cerebrovascular disease affecting unspecified side: Secondary | ICD-10-CM | POA: Diagnosis not present

## 2013-07-26 DIAGNOSIS — I10 Essential (primary) hypertension: Secondary | ICD-10-CM | POA: Insufficient documentation

## 2013-07-26 NOTE — Progress Notes (Signed)
Subjective:    Patient ID: Donald [name], male    DOB: 04-02-1946, 68 y.o.   MRN: [id number] This is a 68 year old right-handed male  with history of hypertension, diabetes mellitus. The patient is from  [place]. He was visiting family in [place]. Admitted June 03, 2013, with left-sided weakness and slurred speech. MRI of the brain  showed acute large right middle cerebral artery infarction as well as  left occipital encephalomalacia suggesting remote small left posterior  cerebral artery territory infarct  HPI CIR admission DATE OF ADMISSION: 06/07/2013  DATE OF DISCHARGE: 07/02/2013  Has followed up with Dr London Pepper at [facility] for PCP  HHPT,HHOT, doing short walks and standing in sink OT concentrated on UE NM re ed  Can dress himself with increased time  Wife assists with AFO Wife supervises transfers, pulling up pants  Pt Goals:  Increasing independence from wife Pain Inventory Average Pain 0 Pain Right Now 0 My pain is n/a  In the last 24 hours, has pain interfered with the following? General activity 0 Relation with others 0 Enjoyment of life 0 What TIME of day is your pain at its worst? n/a Sleep (in general) Good  Pain is worse with: n/a Pain improves with: n/a Relief from Meds: n/a  Mobility walk with assistance do you drive?  no use a wheelchair needs help with transfers Do you have any goals in this area?  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 No pertinent family history. History   Social History  . Marital Status: Married    Spouse Name: N/A    Number of Children: N/A  . Years of Education: N/A   Social History Main Topics  . Smoking status: Former Research scientist (life sciences)  . Smokeless tobacco:  Never Used  . Alcohol Use: None  . Drug Use: No  . Sexual Activity: No   Other Topics Concern  . None   Social History Narrative  . None   Past Surgical History  Procedure Laterality Date  . Tee without cardioversion N/A 06/07/2013    Procedure: TRANSESOPHAGEAL ECHOCARDIOGRAM (TEE);  Surgeon: [name], MD;  Location[facility] ENDOSCOPY;  Service: Cardiovascular;  Laterality: N/A;  . 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 Hypertension   . Diabetes mellitus without complication   . Hyperlipemia   . large right middle cerebral artery infarct, embolic 1/0/6269    a. s/p IV tPA, b. source unknown, c. loop recorder placed   BP 134/75  Pulse 60  Resp 14  Ht 6\' 3"  (1.905 m)  Wt 263 lb (119.296 kg)  BMI 32.87 kg/m2  SpO2 95%  Opioid Risk Score:   Fall Risk Score: Moderate Fall Risk (6-13 points) (patient educated handout given)   Review of Systems  Cardiovascular: Positive for leg swelling.  Musculoskeletal: Positive for gait problem.  Hematological: Bruises/bleeds easily.  All other systems reviewed and are negative.       Objective:   Physical Exam  Nursing note and vitals reviewed. Constitutional: He appears well-developed and well-nourished.  HENT:  Head: Normocephalic and atraumatic.  Eyes: Conjunctivae and EOM are normal. Pupils are equal, round, and reactive to light.  Psychiatric: He has a normal mood and affect.   Her thumb opposition or cannot get  Little finger Sensation- reduced pp,proprio, LT 3-/5 Delt, bi,tri,grip 3-/5 HF, KE , 2- ankle  Left neglect to confrontation  2+ edema, Hand and ankle    Assessment & Plan:  1.  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 Patient should continue home health therapy PT OT then we will transition to outpatient therapy in approximately one month. RTC one month  No need for Botox or  spasticity medication at this time Continue AFO   2. Hypertension patient having sedation side effects probably from clonidine. He will followup with his primary care physician regarding this. Blood pressure and clinic today was in a good range

## 2013-07-26 NOTE — Patient Instructions (Signed)
Massage Left hand

## 2013-07-27 ENCOUNTER — Other Ambulatory Visit: Payer: Self-pay | Admitting: Physical Medicine & Rehabilitation

## 2013-07-27 DIAGNOSIS — R5381 Other malaise: Secondary | ICD-10-CM | POA: Diagnosis not present

## 2013-07-27 DIAGNOSIS — E1149 Type 2 diabetes mellitus with other diabetic neurological complication: Secondary | ICD-10-CM | POA: Diagnosis not present

## 2013-07-27 DIAGNOSIS — R269 Unspecified abnormalities of gait and mobility: Secondary | ICD-10-CM | POA: Diagnosis not present

## 2013-07-27 DIAGNOSIS — I69959 Hemiplegia and hemiparesis following unspecified cerebrovascular disease affecting unspecified side: Secondary | ICD-10-CM | POA: Diagnosis not present

## 2013-07-27 DIAGNOSIS — G909 Disorder of the autonomic nervous system, unspecified: Secondary | ICD-10-CM | POA: Diagnosis not present

## 2013-07-27 DIAGNOSIS — I10 Essential (primary) hypertension: Secondary | ICD-10-CM | POA: Diagnosis not present

## 2013-07-28 DIAGNOSIS — I10 Essential (primary) hypertension: Secondary | ICD-10-CM | POA: Diagnosis not present

## 2013-07-28 DIAGNOSIS — I69959 Hemiplegia and hemiparesis following unspecified cerebrovascular disease affecting unspecified side: Secondary | ICD-10-CM | POA: Diagnosis not present

## 2013-07-28 DIAGNOSIS — G909 Disorder of the autonomic nervous system, unspecified: Secondary | ICD-10-CM | POA: Diagnosis not present

## 2013-07-28 DIAGNOSIS — R269 Unspecified abnormalities of gait and mobility: Secondary | ICD-10-CM | POA: Diagnosis not present

## 2013-07-28 DIAGNOSIS — R5383 Other fatigue: Secondary | ICD-10-CM | POA: Diagnosis not present

## 2013-07-28 DIAGNOSIS — R5381 Other malaise: Secondary | ICD-10-CM | POA: Diagnosis not present

## 2013-07-28 DIAGNOSIS — E1149 Type 2 diabetes mellitus with other diabetic neurological complication: Secondary | ICD-10-CM | POA: Diagnosis not present

## 2013-07-28 LAB — MDC_IDC_ENUM_SESS_TYPE_REMOTE

## 2013-07-29 DIAGNOSIS — R5381 Other malaise: Secondary | ICD-10-CM | POA: Diagnosis not present

## 2013-07-29 DIAGNOSIS — I10 Essential (primary) hypertension: Secondary | ICD-10-CM | POA: Diagnosis not present

## 2013-07-29 DIAGNOSIS — G909 Disorder of the autonomic nervous system, unspecified: Secondary | ICD-10-CM | POA: Diagnosis not present

## 2013-07-29 DIAGNOSIS — R5383 Other fatigue: Secondary | ICD-10-CM | POA: Diagnosis not present

## 2013-07-29 DIAGNOSIS — I69959 Hemiplegia and hemiparesis following unspecified cerebrovascular disease affecting unspecified side: Secondary | ICD-10-CM | POA: Diagnosis not present

## 2013-07-29 DIAGNOSIS — E1149 Type 2 diabetes mellitus with other diabetic neurological complication: Secondary | ICD-10-CM | POA: Diagnosis not present

## 2013-07-29 DIAGNOSIS — R269 Unspecified abnormalities of gait and mobility: Secondary | ICD-10-CM | POA: Diagnosis not present

## 2013-08-01 ENCOUNTER — Other Ambulatory Visit: Payer: Self-pay | Admitting: Physical Medicine & Rehabilitation

## 2013-08-01 DIAGNOSIS — I69959 Hemiplegia and hemiparesis following unspecified cerebrovascular disease affecting unspecified side: Secondary | ICD-10-CM | POA: Diagnosis not present

## 2013-08-01 DIAGNOSIS — R5383 Other fatigue: Secondary | ICD-10-CM | POA: Diagnosis not present

## 2013-08-01 DIAGNOSIS — E1149 Type 2 diabetes mellitus with other diabetic neurological complication: Secondary | ICD-10-CM | POA: Diagnosis not present

## 2013-08-01 DIAGNOSIS — R5381 Other malaise: Secondary | ICD-10-CM | POA: Diagnosis not present

## 2013-08-01 DIAGNOSIS — G909 Disorder of the autonomic nervous system, unspecified: Secondary | ICD-10-CM | POA: Diagnosis not present

## 2013-08-01 DIAGNOSIS — I10 Essential (primary) hypertension: Secondary | ICD-10-CM | POA: Diagnosis not present

## 2013-08-01 DIAGNOSIS — R269 Unspecified abnormalities of gait and mobility: Secondary | ICD-10-CM | POA: Diagnosis not present

## 2013-08-02 DIAGNOSIS — R269 Unspecified abnormalities of gait and mobility: Secondary | ICD-10-CM | POA: Diagnosis not present

## 2013-08-02 DIAGNOSIS — G909 Disorder of the autonomic nervous system, unspecified: Secondary | ICD-10-CM | POA: Diagnosis not present

## 2013-08-02 DIAGNOSIS — E1149 Type 2 diabetes mellitus with other diabetic neurological complication: Secondary | ICD-10-CM | POA: Diagnosis not present

## 2013-08-02 DIAGNOSIS — I10 Essential (primary) hypertension: Secondary | ICD-10-CM | POA: Diagnosis not present

## 2013-08-02 DIAGNOSIS — I69959 Hemiplegia and hemiparesis following unspecified cerebrovascular disease affecting unspecified side: Secondary | ICD-10-CM | POA: Diagnosis not present

## 2013-08-02 DIAGNOSIS — R5381 Other malaise: Secondary | ICD-10-CM | POA: Diagnosis not present

## 2013-08-02 DIAGNOSIS — R5383 Other fatigue: Secondary | ICD-10-CM | POA: Diagnosis not present

## 2013-08-03 DIAGNOSIS — R5381 Other malaise: Secondary | ICD-10-CM | POA: Diagnosis not present

## 2013-08-03 DIAGNOSIS — I69959 Hemiplegia and hemiparesis following unspecified cerebrovascular disease affecting unspecified side: Secondary | ICD-10-CM | POA: Diagnosis not present

## 2013-08-03 DIAGNOSIS — E1149 Type 2 diabetes mellitus with other diabetic neurological complication: Secondary | ICD-10-CM | POA: Diagnosis not present

## 2013-08-03 DIAGNOSIS — R5383 Other fatigue: Secondary | ICD-10-CM | POA: Diagnosis not present

## 2013-08-03 DIAGNOSIS — G909 Disorder of the autonomic nervous system, unspecified: Secondary | ICD-10-CM | POA: Diagnosis not present

## 2013-08-03 DIAGNOSIS — R269 Unspecified abnormalities of gait and mobility: Secondary | ICD-10-CM | POA: Diagnosis not present

## 2013-08-03 DIAGNOSIS — I10 Essential (primary) hypertension: Secondary | ICD-10-CM | POA: Diagnosis not present

## 2013-08-04 DIAGNOSIS — I6350 Cerebral infarction due to unspecified occlusion or stenosis of unspecified cerebral artery: Secondary | ICD-10-CM | POA: Diagnosis not present

## 2013-08-04 DIAGNOSIS — E119 Type 2 diabetes mellitus without complications: Secondary | ICD-10-CM | POA: Diagnosis not present

## 2013-08-04 DIAGNOSIS — I10 Essential (primary) hypertension: Secondary | ICD-10-CM | POA: Diagnosis not present

## 2013-08-08 ENCOUNTER — Encounter: Payer: Self-pay | Admitting: Internal Medicine

## 2013-08-08 ENCOUNTER — Ambulatory Visit (INDEPENDENT_AMBULATORY_CARE_PROVIDER_SITE_OTHER): Payer: Medicare Other | Admitting: Internal Medicine

## 2013-08-08 ENCOUNTER — Ambulatory Visit (INDEPENDENT_AMBULATORY_CARE_PROVIDER_SITE_OTHER): Payer: Medicare Other | Admitting: *Deleted

## 2013-08-08 VITALS — BP 154/89 | HR 62 | Ht 75.5 in | Wt 251.0 lb

## 2013-08-08 DIAGNOSIS — I6340 Cerebral infarction due to embolism of unspecified cerebral artery: Secondary | ICD-10-CM | POA: Diagnosis not present

## 2013-08-08 DIAGNOSIS — E1149 Type 2 diabetes mellitus with other diabetic neurological complication: Secondary | ICD-10-CM | POA: Diagnosis not present

## 2013-08-08 DIAGNOSIS — I6350 Cerebral infarction due to unspecified occlusion or stenosis of unspecified cerebral artery: Secondary | ICD-10-CM

## 2013-08-08 DIAGNOSIS — I69959 Hemiplegia and hemiparesis following unspecified cerebrovascular disease affecting unspecified side: Secondary | ICD-10-CM | POA: Diagnosis not present

## 2013-08-08 DIAGNOSIS — I498 Other specified cardiac arrhythmias: Secondary | ICD-10-CM

## 2013-08-08 DIAGNOSIS — I639 Cerebral infarction, unspecified: Secondary | ICD-10-CM

## 2013-08-08 DIAGNOSIS — I10 Essential (primary) hypertension: Secondary | ICD-10-CM | POA: Diagnosis not present

## 2013-08-08 DIAGNOSIS — R001 Bradycardia, unspecified: Secondary | ICD-10-CM

## 2013-08-08 DIAGNOSIS — G909 Disorder of the autonomic nervous system, unspecified: Secondary | ICD-10-CM | POA: Diagnosis not present

## 2013-08-08 LAB — MDC_IDC_ENUM_SESS_TYPE_INCLINIC

## 2013-08-08 MED ORDER — HYDROCHLOROTHIAZIDE 12.5 MG PO CAPS: 25.0000 mg | ORAL_CAPSULE | Freq: Every day | ORAL | Status: DC

## 2013-08-08 NOTE — Progress Notes (Signed)
Donald [name] is a 68 y.o. male who presents today for routine electrophysiology followup. He is making slow recovery from his stroke 1/15.  His primary concern is with ongoing hypertension.  Today, he denies symptoms of palpitations, chest pain, shortness of breath,  lower extremity edema, dizziness, presyncope, or syncope.  The patient is otherwise without complaint today.   Past Medical History  Diagnosis Date  . Hypertension   . Diabetes mellitus without complication   . Hyperlipemia   . large right middle cerebral artery infarct, embolic 11/06/8936    a. s/p IV tPA, b. source unknown, c. loop recorder placed   Past Surgical History  Procedure Laterality Date  . Tee without cardioversion N/A 06/07/2013    Procedure: TRANSESOPHAGEAL ECHOCARDIOGRAM (TEE);  Surgeon: Donald Spark, MD;  Location: [facility] ENDOSCOPY;  Service: Cardiovascular;  Laterality: N/A;  . Loop recorder implant  06/07/2013    MDT LinQ implanted by Dr Donald [name] for cryptogenic stroke    Current Outpatient Prescriptions  Medication Sig Dispense Refill  . amLODipine (NORVASC) 10 MG tablet Take 1 tablet (10 mg total) by mouth daily.  30 tablet  1  . carvedilol (COREG) 25 MG tablet Take 1 tablet (25 mg total) by mouth 2 (two) times daily with a meal.  60 tablet  1  . cloNIDine (CATAPRES) 0.3 MG tablet Take 1 tablet (0.3 mg total) by mouth 3 (three) times daily.  90 tablet  1  . clopidogrel (PLAVIX) 75 MG tablet Take 1 tablet (75 mg total) by mouth daily with breakfast.  30 tablet  1  . glyBURIDE (DIABETA) 5 MG tablet Take 2.5 mg by mouth daily with breakfast.      . hydrALAZINE (APRESOLINE) 10 MG tablet Take 10 mg by mouth 4 (four) times daily.      . hydrochlorothiazide (MICROZIDE) 12.5 MG capsule Take 2 capsules (25 mg total) by mouth daily.  90 capsule  3  . lisinopril (PRINIVIL,ZESTRIL) 40 MG tablet Take 1 tablet (40 mg total) by mouth daily.  30 tablet  1  . metFORMIN (GLUCOPHAGE-XR) 750 MG 24 hr tablet Take 750 mg by  mouth daily with breakfast.       No current facility-administered medications for this visit.    Physical Exam: Filed Vitals:   08/08/13 1428  BP: 154/89  Pulse: 62  Height: 6' 3.5" (1.918 m)  Weight: 251 lb (113.853 kg)    GEN- The patient is well appearing, alert and oriented x 3 today.   Head- normocephalic, atraumatic Eyes-  Sclera clear, conjunctiva pink Ears- hearing intact Oropharynx- clear Lungs- Clear to ausculation bilaterally, normal work of breathing Chest- ILR site is well healed Heart- Regular rate and rhythm, no murmurs, rubs or gallops, PMI not laterally displaced GI- soft, NT, ND, + BS Extremities- no clubbing, cyanosis, or edema  LINQ interrogation- reviewed in detail today,  See PACEART report  Assessment and Plan:  1. Cryptogenic stroke Normal loop recorder function See Donald [name] Art report No changes today  2. HTN Above goal Increase hctz to 25mg  daily He has scheduled followup with primary care for BP management and will need a bmet at that time  3. Vagal event ILR reveals bradycardia in the setting of a GI event / vagal episode 06/24/13.  He has no other symptoms of bradycardia No further workup is planned.  carelink to monitor for afib I will see as needed going forward

## 2013-08-08 NOTE — Patient Instructions (Signed)
Your physician recommends that you schedule a follow-up appointment as needed and follow up with PCP  Your physician has recommended you make the following change in your medication: 1) Increase HCTZ to 25mg  daily

## 2013-08-09 DIAGNOSIS — E1149 Type 2 diabetes mellitus with other diabetic neurological complication: Secondary | ICD-10-CM | POA: Diagnosis not present

## 2013-08-09 DIAGNOSIS — G909 Disorder of the autonomic nervous system, unspecified: Secondary | ICD-10-CM | POA: Diagnosis not present

## 2013-08-09 DIAGNOSIS — R5383 Other fatigue: Secondary | ICD-10-CM | POA: Diagnosis not present

## 2013-08-09 DIAGNOSIS — R5381 Other malaise: Secondary | ICD-10-CM | POA: Diagnosis not present

## 2013-08-09 DIAGNOSIS — I69959 Hemiplegia and hemiparesis following unspecified cerebrovascular disease affecting unspecified side: Secondary | ICD-10-CM | POA: Diagnosis not present

## 2013-08-09 DIAGNOSIS — I10 Essential (primary) hypertension: Secondary | ICD-10-CM | POA: Diagnosis not present

## 2013-08-09 DIAGNOSIS — R269 Unspecified abnormalities of gait and mobility: Secondary | ICD-10-CM | POA: Diagnosis not present

## 2013-08-10 DIAGNOSIS — I69959 Hemiplegia and hemiparesis following unspecified cerebrovascular disease affecting unspecified side: Secondary | ICD-10-CM | POA: Diagnosis not present

## 2013-08-10 DIAGNOSIS — R5381 Other malaise: Secondary | ICD-10-CM | POA: Diagnosis not present

## 2013-08-10 DIAGNOSIS — I10 Essential (primary) hypertension: Secondary | ICD-10-CM | POA: Diagnosis not present

## 2013-08-10 DIAGNOSIS — R5383 Other fatigue: Secondary | ICD-10-CM | POA: Diagnosis not present

## 2013-08-10 DIAGNOSIS — R269 Unspecified abnormalities of gait and mobility: Secondary | ICD-10-CM | POA: Diagnosis not present

## 2013-08-10 DIAGNOSIS — G909 Disorder of the autonomic nervous system, unspecified: Secondary | ICD-10-CM | POA: Diagnosis not present

## 2013-08-10 DIAGNOSIS — E1149 Type 2 diabetes mellitus with other diabetic neurological complication: Secondary | ICD-10-CM | POA: Diagnosis not present

## 2013-08-11 DIAGNOSIS — R5381 Other malaise: Secondary | ICD-10-CM | POA: Diagnosis not present

## 2013-08-11 DIAGNOSIS — R269 Unspecified abnormalities of gait and mobility: Secondary | ICD-10-CM | POA: Diagnosis not present

## 2013-08-11 DIAGNOSIS — I69959 Hemiplegia and hemiparesis following unspecified cerebrovascular disease affecting unspecified side: Secondary | ICD-10-CM | POA: Diagnosis not present

## 2013-08-11 DIAGNOSIS — I10 Essential (primary) hypertension: Secondary | ICD-10-CM | POA: Diagnosis not present

## 2013-08-11 DIAGNOSIS — G909 Disorder of the autonomic nervous system, unspecified: Secondary | ICD-10-CM | POA: Diagnosis not present

## 2013-08-11 DIAGNOSIS — E1149 Type 2 diabetes mellitus with other diabetic neurological complication: Secondary | ICD-10-CM | POA: Diagnosis not present

## 2013-08-12 DIAGNOSIS — R5381 Other malaise: Secondary | ICD-10-CM | POA: Diagnosis not present

## 2013-08-12 DIAGNOSIS — I69959 Hemiplegia and hemiparesis following unspecified cerebrovascular disease affecting unspecified side: Secondary | ICD-10-CM | POA: Diagnosis not present

## 2013-08-12 DIAGNOSIS — I10 Essential (primary) hypertension: Secondary | ICD-10-CM | POA: Diagnosis not present

## 2013-08-12 DIAGNOSIS — R269 Unspecified abnormalities of gait and mobility: Secondary | ICD-10-CM | POA: Diagnosis not present

## 2013-08-12 DIAGNOSIS — G909 Disorder of the autonomic nervous system, unspecified: Secondary | ICD-10-CM | POA: Diagnosis not present

## 2013-08-12 DIAGNOSIS — E1149 Type 2 diabetes mellitus with other diabetic neurological complication: Secondary | ICD-10-CM | POA: Diagnosis not present

## 2013-08-12 DIAGNOSIS — R5383 Other fatigue: Secondary | ICD-10-CM | POA: Diagnosis not present

## 2013-08-15 ENCOUNTER — Encounter: Payer: Self-pay | Admitting: Neurology

## 2013-08-15 DIAGNOSIS — I69959 Hemiplegia and hemiparesis following unspecified cerebrovascular disease affecting unspecified side: Secondary | ICD-10-CM | POA: Diagnosis not present

## 2013-08-15 DIAGNOSIS — R269 Unspecified abnormalities of gait and mobility: Secondary | ICD-10-CM | POA: Diagnosis not present

## 2013-08-15 DIAGNOSIS — G909 Disorder of the autonomic nervous system, unspecified: Secondary | ICD-10-CM | POA: Diagnosis not present

## 2013-08-15 DIAGNOSIS — I10 Essential (primary) hypertension: Secondary | ICD-10-CM | POA: Diagnosis not present

## 2013-08-15 DIAGNOSIS — R5383 Other fatigue: Secondary | ICD-10-CM | POA: Diagnosis not present

## 2013-08-15 DIAGNOSIS — R5381 Other malaise: Secondary | ICD-10-CM | POA: Diagnosis not present

## 2013-08-15 DIAGNOSIS — E1149 Type 2 diabetes mellitus with other diabetic neurological complication: Secondary | ICD-10-CM | POA: Diagnosis not present

## 2013-08-16 ENCOUNTER — Telehealth: Payer: Self-pay | Admitting: Neurology

## 2013-08-16 NOTE — Telephone Encounter (Signed)
Referenced to telephone note from 08/16/13

## 2013-08-16 NOTE — Telephone Encounter (Signed)
Pt's wife called regarding the rescheduling of the appointment on 08-17-13 @ 1:00. She states that her husband had his stroke during their visit here at [place] time. She stated she had tried to call our office multiple times but wasn't able to get through. She called last week and was told that it would be June before we would be able to get him into the office, but she did not want him to wait that long and said she would call his new PCP to see if there would be any way to get him in faster. His referral was submitted and the appointment made on 3-18. They received a call (to be followed up with a letter) stating that the appointment was rescheduled to 6/29 @ 2:00. Pt's wife is very upset. Please call her @ [phone] and if you are unable to get her please call Mr. Tassinari @ [phone].  Thank you

## 2013-08-16 NOTE — Telephone Encounter (Signed)
Patient's wife called to confirm appt with Ms Lam/ confirmed but still a little concerned because she wanted to schedule w/ Dr Leonie Man, explained that she (Lam) sees his hospital f/u appt patients, and will consult with Dr Leonie Man during visit, she verbalized understanding.

## 2013-08-16 NOTE — Telephone Encounter (Signed)
Patient returning call regarding sooner appointment, please call patient directly at [phone].

## 2013-08-16 NOTE — Telephone Encounter (Signed)
Called patient to schedule sooner appt.scheduled with Ms Chauncy Passy. lt message for patient's wife to call back to confirm

## 2013-08-17 ENCOUNTER — Ambulatory Visit: Payer: Medicare Other | Admitting: Neurology

## 2013-08-18 DIAGNOSIS — R5381 Other malaise: Secondary | ICD-10-CM | POA: Diagnosis not present

## 2013-08-18 DIAGNOSIS — I69959 Hemiplegia and hemiparesis following unspecified cerebrovascular disease affecting unspecified side: Secondary | ICD-10-CM | POA: Diagnosis not present

## 2013-08-18 DIAGNOSIS — R269 Unspecified abnormalities of gait and mobility: Secondary | ICD-10-CM | POA: Diagnosis not present

## 2013-08-18 DIAGNOSIS — I10 Essential (primary) hypertension: Secondary | ICD-10-CM | POA: Diagnosis not present

## 2013-08-18 DIAGNOSIS — E1149 Type 2 diabetes mellitus with other diabetic neurological complication: Secondary | ICD-10-CM | POA: Diagnosis not present

## 2013-08-18 DIAGNOSIS — G909 Disorder of the autonomic nervous system, unspecified: Secondary | ICD-10-CM | POA: Diagnosis not present

## 2013-08-19 DIAGNOSIS — R269 Unspecified abnormalities of gait and mobility: Secondary | ICD-10-CM | POA: Diagnosis not present

## 2013-08-19 DIAGNOSIS — I10 Essential (primary) hypertension: Secondary | ICD-10-CM | POA: Diagnosis not present

## 2013-08-19 DIAGNOSIS — G909 Disorder of the autonomic nervous system, unspecified: Secondary | ICD-10-CM | POA: Diagnosis not present

## 2013-08-19 DIAGNOSIS — R5381 Other malaise: Secondary | ICD-10-CM | POA: Diagnosis not present

## 2013-08-19 DIAGNOSIS — E1149 Type 2 diabetes mellitus with other diabetic neurological complication: Secondary | ICD-10-CM | POA: Diagnosis not present

## 2013-08-19 DIAGNOSIS — R5383 Other fatigue: Secondary | ICD-10-CM | POA: Diagnosis not present

## 2013-08-19 DIAGNOSIS — I69959 Hemiplegia and hemiparesis following unspecified cerebrovascular disease affecting unspecified side: Secondary | ICD-10-CM | POA: Diagnosis not present

## 2013-08-22 DIAGNOSIS — G909 Disorder of the autonomic nervous system, unspecified: Secondary | ICD-10-CM | POA: Diagnosis not present

## 2013-08-22 DIAGNOSIS — R269 Unspecified abnormalities of gait and mobility: Secondary | ICD-10-CM | POA: Diagnosis not present

## 2013-08-22 DIAGNOSIS — E1149 Type 2 diabetes mellitus with other diabetic neurological complication: Secondary | ICD-10-CM | POA: Diagnosis not present

## 2013-08-22 DIAGNOSIS — I10 Essential (primary) hypertension: Secondary | ICD-10-CM | POA: Diagnosis not present

## 2013-08-22 DIAGNOSIS — R5381 Other malaise: Secondary | ICD-10-CM | POA: Diagnosis not present

## 2013-08-22 DIAGNOSIS — I69959 Hemiplegia and hemiparesis following unspecified cerebrovascular disease affecting unspecified side: Secondary | ICD-10-CM | POA: Diagnosis not present

## 2013-08-24 DIAGNOSIS — R5381 Other malaise: Secondary | ICD-10-CM | POA: Diagnosis not present

## 2013-08-24 DIAGNOSIS — G909 Disorder of the autonomic nervous system, unspecified: Secondary | ICD-10-CM | POA: Diagnosis not present

## 2013-08-24 DIAGNOSIS — E1149 Type 2 diabetes mellitus with other diabetic neurological complication: Secondary | ICD-10-CM | POA: Diagnosis not present

## 2013-08-24 DIAGNOSIS — R5383 Other fatigue: Secondary | ICD-10-CM | POA: Diagnosis not present

## 2013-08-24 DIAGNOSIS — R269 Unspecified abnormalities of gait and mobility: Secondary | ICD-10-CM | POA: Diagnosis not present

## 2013-08-24 DIAGNOSIS — I69959 Hemiplegia and hemiparesis following unspecified cerebrovascular disease affecting unspecified side: Secondary | ICD-10-CM | POA: Diagnosis not present

## 2013-08-24 DIAGNOSIS — I10 Essential (primary) hypertension: Secondary | ICD-10-CM | POA: Diagnosis not present

## 2013-08-25 ENCOUNTER — Other Ambulatory Visit: Payer: Self-pay | Admitting: Physical Medicine & Rehabilitation

## 2013-08-25 DIAGNOSIS — E1149 Type 2 diabetes mellitus with other diabetic neurological complication: Secondary | ICD-10-CM | POA: Diagnosis not present

## 2013-08-25 DIAGNOSIS — R5383 Other fatigue: Secondary | ICD-10-CM | POA: Diagnosis not present

## 2013-08-25 DIAGNOSIS — I10 Essential (primary) hypertension: Secondary | ICD-10-CM | POA: Diagnosis not present

## 2013-08-25 DIAGNOSIS — R5381 Other malaise: Secondary | ICD-10-CM | POA: Diagnosis not present

## 2013-08-25 DIAGNOSIS — G909 Disorder of the autonomic nervous system, unspecified: Secondary | ICD-10-CM | POA: Diagnosis not present

## 2013-08-25 DIAGNOSIS — R269 Unspecified abnormalities of gait and mobility: Secondary | ICD-10-CM | POA: Diagnosis not present

## 2013-08-25 DIAGNOSIS — I69959 Hemiplegia and hemiparesis following unspecified cerebrovascular disease affecting unspecified side: Secondary | ICD-10-CM | POA: Diagnosis not present

## 2013-08-26 ENCOUNTER — Encounter: Payer: Medicare Other | Attending: Physical Medicine & Rehabilitation

## 2013-08-26 ENCOUNTER — Ambulatory Visit (HOSPITAL_BASED_OUTPATIENT_CLINIC_OR_DEPARTMENT_OTHER): Payer: Medicare Other | Admitting: Physical Medicine & Rehabilitation

## 2013-08-26 ENCOUNTER — Encounter: Payer: Self-pay | Admitting: Physical Medicine & Rehabilitation

## 2013-08-26 VITALS — BP 142/76 | HR 65 | Resp 14 | Ht 75.0 in | Wt 251.0 lb

## 2013-08-26 DIAGNOSIS — G8110 Spastic hemiplegia affecting unspecified side: Secondary | ICD-10-CM | POA: Diagnosis not present

## 2013-08-26 DIAGNOSIS — I69959 Hemiplegia and hemiparesis following unspecified cerebrovascular disease affecting unspecified side: Secondary | ICD-10-CM | POA: Diagnosis not present

## 2013-08-26 DIAGNOSIS — I6340 Cerebral infarction due to embolism of unspecified cerebral artery: Secondary | ICD-10-CM | POA: Diagnosis not present

## 2013-08-26 DIAGNOSIS — G5620 Lesion of ulnar nerve, unspecified upper limb: Secondary | ICD-10-CM

## 2013-08-26 DIAGNOSIS — G5622 Lesion of ulnar nerve, left upper limb: Secondary | ICD-10-CM

## 2013-08-26 DIAGNOSIS — S63502A Unspecified sprain of left wrist, initial encounter: Secondary | ICD-10-CM

## 2013-08-26 DIAGNOSIS — I10 Essential (primary) hypertension: Secondary | ICD-10-CM | POA: Insufficient documentation

## 2013-08-26 DIAGNOSIS — S63509A Unspecified sprain of unspecified wrist, initial encounter: Secondary | ICD-10-CM

## 2013-08-26 NOTE — Progress Notes (Signed)
Subjective:    Patient ID: Donald [name], male    DOB: 1945/07/08, 68 y.o.   MRN: [id number]  HPI Right MCA infarct 06/03/2013 CIR 06/07/2013 through 07/02/2013  Home health PT and OT finishing next week, therapist are recommending outpatient therapy  Patient remains very motivated Has followed up with primary care physician for further blood pressure management  Hand and elbow pain Left side Started after leaning on Left elbow in car for 2-3 hrs  Pain Inventory Average Pain 1 Pain Right Now 1 My pain is sharp and aching  In the last 24 hours, has pain interfered with the following? General activity 1 Relation with others 1 Enjoyment of life 1 What TIME of day is your pain at its worst? night Sleep (in general) Fair  Pain is worse with: some activites Pain improves with: therapy/exercise and medication Relief from Meds: 4  Mobility walk with assistance use a walker how many minutes can you walk? 15 ability to climb steps?  no do you drive?  no use a wheelchair needs help with transfers Do you have any goals in this area?  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  yes  Neuro/Psych weakness numbness trouble walking  Prior Studies Any changes since last visit?  no  Physicians involved in your care Any changes since last visit?  no   History reviewed. No pertinent family history. History   Social History  . Marital Status: Married    Spouse Name: N/A    Number of Children: N/A  . Years of Education: N/A   Social History Main Topics  . Smoking status: Former Research scientist (life sciences)  . Smokeless tobacco: Never Used  . Alcohol Use: None  . Drug Use: No  . Sexual Activity: No   Other Topics Concern  . None   Social History Narrative  . None   Past Surgical History  Procedure  Laterality Date  . Tee without cardioversion N/A 06/07/2013    Procedure: TRANSESOPHAGEAL ECHOCARDIOGRAM (TEE);  Surgeon: [name], MD;  Location: [facility] ENDOSCOPY;  Service: Cardiovascular;  Laterality: N/A;  . 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 Hypertension   . Diabetes mellitus without complication   . Hyperlipemia   . large right middle cerebral artery infarct, embolic 07/07/5807    a. s/p IV tPA, b. source unknown, c. loop recorder placed   BP 142/76  Pulse 65  Resp 14  Ht 6\' 3"  (1.905 m)  Wt 251 lb (113.853 kg)  BMI 31.37 kg/m2  SpO2 98%  Opioid Risk Score:   Fall Risk Score: Moderate Fall Risk (6-13 points) (pt educated and given brochure on fall risk)    Review of Systems  Constitutional: Positive for unexpected weight change.  Cardiovascular: Positive for leg swelling.  Endocrine:       High blood sugar  Musculoskeletal: Positive for gait problem.  Neurological: Positive for weakness and numbness.  Hematological: Bruises/bleeds easily.  All other systems reviewed and are negative.       Objective:   Physical Exam  Left 5th digit numbness Left wrist mild swelling,  No shoulder pain with ROM Neg Tinel's at elbow or wrist Clonus at fingers 3-4 beats Motor strength 3 minus  deltoids biceps triceps grip 4 minus left hip flexion 4+ left knee extension 2+ edema left dorsal hand and wrist Right-sided strength normal Thumb opposition cannot pose little finger on the left Mood and affect are appropriate     Assessment & Plan:  1.  Left ulnar neuropathy, compression, elbow pad, if no better EMG/NCV  2.  R MCA infarct Left HP and Hemisensory deficits, Referral to outpatient PT and OT. Discussed multiple issues with patient and his wife. No need for Botox or spasticity medication at the current time  3.  L wrist pain mild synovitis- wrist splint for 2 wks  4.   HTN per PCP  Over half of the 25 min visit was spent counseling and coordinating care.

## 2013-08-26 NOTE — Patient Instructions (Signed)
Left elbow pad  Left wrist splint wear for 2 wks 24/7  Outpt PT,OT referal

## 2013-08-29 DIAGNOSIS — G909 Disorder of the autonomic nervous system, unspecified: Secondary | ICD-10-CM | POA: Diagnosis not present

## 2013-08-29 DIAGNOSIS — I10 Essential (primary) hypertension: Secondary | ICD-10-CM | POA: Diagnosis not present

## 2013-08-29 DIAGNOSIS — R269 Unspecified abnormalities of gait and mobility: Secondary | ICD-10-CM | POA: Diagnosis not present

## 2013-08-29 DIAGNOSIS — E1149 Type 2 diabetes mellitus with other diabetic neurological complication: Secondary | ICD-10-CM | POA: Diagnosis not present

## 2013-08-29 DIAGNOSIS — I69959 Hemiplegia and hemiparesis following unspecified cerebrovascular disease affecting unspecified side: Secondary | ICD-10-CM | POA: Diagnosis not present

## 2013-08-29 DIAGNOSIS — R5381 Other malaise: Secondary | ICD-10-CM | POA: Diagnosis not present

## 2013-08-29 DIAGNOSIS — R5383 Other fatigue: Secondary | ICD-10-CM | POA: Diagnosis not present

## 2013-08-31 DIAGNOSIS — G909 Disorder of the autonomic nervous system, unspecified: Secondary | ICD-10-CM | POA: Diagnosis not present

## 2013-08-31 DIAGNOSIS — I69959 Hemiplegia and hemiparesis following unspecified cerebrovascular disease affecting unspecified side: Secondary | ICD-10-CM | POA: Diagnosis not present

## 2013-08-31 DIAGNOSIS — E1149 Type 2 diabetes mellitus with other diabetic neurological complication: Secondary | ICD-10-CM | POA: Diagnosis not present

## 2013-08-31 DIAGNOSIS — I10 Essential (primary) hypertension: Secondary | ICD-10-CM | POA: Diagnosis not present

## 2013-08-31 DIAGNOSIS — R269 Unspecified abnormalities of gait and mobility: Secondary | ICD-10-CM | POA: Diagnosis not present

## 2013-08-31 DIAGNOSIS — R5381 Other malaise: Secondary | ICD-10-CM | POA: Diagnosis not present

## 2013-09-01 ENCOUNTER — Encounter (INDEPENDENT_AMBULATORY_CARE_PROVIDER_SITE_OTHER): Payer: Self-pay

## 2013-09-01 ENCOUNTER — Encounter: Payer: Self-pay | Admitting: Nurse Practitioner

## 2013-09-01 ENCOUNTER — Ambulatory Visit (INDEPENDENT_AMBULATORY_CARE_PROVIDER_SITE_OTHER): Payer: Medicare Other | Admitting: Nurse Practitioner

## 2013-09-01 VITALS — BP 161/90 | HR 66 | Ht 75.0 in | Wt 251.0 lb

## 2013-09-01 DIAGNOSIS — I10 Essential (primary) hypertension: Secondary | ICD-10-CM | POA: Diagnosis not present

## 2013-09-01 DIAGNOSIS — I639 Cerebral infarction, unspecified: Secondary | ICD-10-CM

## 2013-09-01 DIAGNOSIS — G8110 Spastic hemiplegia affecting unspecified side: Secondary | ICD-10-CM

## 2013-09-01 DIAGNOSIS — E119 Type 2 diabetes mellitus without complications: Secondary | ICD-10-CM | POA: Diagnosis not present

## 2013-09-01 DIAGNOSIS — I6340 Cerebral infarction due to embolism of unspecified cerebral artery: Secondary | ICD-10-CM

## 2013-09-01 DIAGNOSIS — G8114 Spastic hemiplegia affecting left nondominant side: Secondary | ICD-10-CM

## 2013-09-01 DIAGNOSIS — I6350 Cerebral infarction due to unspecified occlusion or stenosis of unspecified cerebral artery: Secondary | ICD-10-CM

## 2013-09-01 NOTE — Patient Instructions (Signed)
Continue clopidogrel 75 mg orally every day  for secondary stroke prevention and maintain strict control of hypertension with blood pressure goal below 130/90, diabetes with hemoglobin A1c goal below 6.5% and lipids with LDL cholesterol goal below 100 mg/dL.  Followup in the future with Dr. Leonie Man in 3 months.   STROKE/TIA INSTRUCTIONS SMOKING Cigarette smoking nearly doubles your risk of having a stroke & is the single most alterable risk factor  If you smoke or have smoked in the last 12 months, you are advised to quit smoking for your health.  Most of the excess cardiovascular risk related to smoking disappears within a year of stopping.  Ask you doctor about anti-smoking medications  [place] Quit Line: 1-800-QUIT NOW  Free Smoking Cessation Classes [phone]  CHOLESTEROL Know your levels; limit fat & cholesterol in your diet  Lab Results  Component Value Date   CHOL 145 06/03/2013   HDL 28* 06/03/2013   LDLCALC 84 06/03/2013   TRIG 163* 06/03/2013   CHOLHDL 5.2 06/03/2013      Many patients benefit from treatment even if their cholesterol is at goal.  Goal: Total Cholesterol less than 160  Goal:  LDL less than 100  Goal:  HDL greater than 40  Goal:  Triglycerides less than 150  BLOOD PRESSURE American Stroke Association blood pressure target is less that 120/80 mm/Hg  Your discharge blood pressure is:  BP: 161/90 mmHg  Monitor your blood pressure  Limit your salt and alcohol intake  Many individuals will require more than one medication for high blood pressure  DIABETES (A1c is a blood sugar average for last 3 months) Goal A1c is under 7% (A1c is blood sugar average for last 3 months)  Diabetes: Diagnosis of diabetes:  A1c was not drawn this admission    Lab Results  Component Value Date   HGBA1C 6.2* 06/03/2013    Your A1c can be lowered with medications, healthy diet, and exercise.  Check your blood sugar as directed by your physician  Call your physician if you  experience unexplained or low blood sugars.  PHYSICAL ACTIVITY/REHABILITATION Goal is 30 minutes at least 4 days per week    Activity decreases your risk of heart attack and stroke and makes your heart stronger.  It helps control your weight and blood pressure; helps you relax and can improve your mood.  Participate in a regular exercise program.  Talk with your doctor about the best form of exercise for you (dancing, walking, swimming, cycling).  DIET/WEIGHT Goal is to maintain a healthy weight  Your height is:  Height: 6\' 3"  (190.5 cm) Your current weight is: Weight: 251 lb (113.853 kg) Your body Mass Index (BMI) is:  BMI (Calculated): 31.4  Following the type of diet specifically designed for you will help prevent another stroke.  Your goal Body Mass Index (BMI) is 19-24.  Healthy food habits can help reduce 3 risk factors for stroke:  High cholesterol, hypertension, and excess weight.

## 2013-09-01 NOTE — Progress Notes (Signed)
PATIENT: Donald [name] DOB: 1945-07-25  REASON FOR VISIT: hospital follow up for stroke HISTORY FROM: patient  HISTORY OF PRESENT ILLNESS: Donald [name] is an 68 y.o. male who was visiting here for the holidays on 06/03/2013.  When he got up he was normal. As he was returning to bed began to be short of breath. Patient then was noted to have slurred speech by his wife and she noted a facial droop. Patient seemed to be weak on the left a well. EMS was called and the patient was brought in as a code stroke. Initial NIHSS of 8. Patient was given TPA. No intervenable lesion was seen on CTA. He was admitted to the neuro ICU for further evaluation and treatment.  CT angio head showed Subtle attenuation of the distal right superior MCA territory branch vessels as compared to the left. No proximal branch occlusion or high-grade flow-limiting stenosis identified within the right middle cerebral artery proximally.  Otherwise unremarkable CTA of the head without evidence of occlusion, high-grade stenosis, or aneurysm elsewhere within the brain.  CT angio neck was a normal CTA of the neck without evidence of high-grade stenosis, occlusion, or dissection. 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  MRI of the brain showed an acute [name] right middle cerebral artery territory infarct. Minimal underlying petechial hemorrhage without lobar hematoma. 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 Minimal additional white matter changes suggest chronic small vessel ischemic disease. Acute on chronic paranasal sinusitis. 2D Echocardiogram with the estimated ejection fraction was in the range of 60% to 65%. He was admitted to [facility] 06/07/2013 through 07/02/2013. He is completed home physical therapy and occupational therapy and is about to start going to outpatient neuro rehabilitation.  He is making good progress with his rehabilitation and is very  determined.  He and his wife have moved in with her daughter here in [place]. He has established care with Dr. Orland Mustard and is seeing him every 3 weeks for adjustments to his hypertensive medications, as his hypertension has been very difficult to control since his 37s.  He thinks he forgot to take his blood pressure medicine the day he had the stroke.  His blood pressure is elevated office today at 161/90, he is tolerating Plavix well without any significant bruising or bleeding. He is able to ambulate short distances with a hemiwalker and AFO brace on the left. He is having some spasticity in the left arm with clonus at times.  REVIEW OF SYSTEMS: Full 14 system review of systems performed and notable only for:  Leg swelling, daytime sleepiness, snoring, walking difficulty, bruise/bleed easily, skin sensitivity/numbness, weakness on left side, agitation.  ALLERGIES: No Known Allergies  HOME MEDICATIONS: Outpatient Prescriptions Prior to Visit  Medication Sig Dispense Refill  . amLODipine (NORVASC) 10 MG tablet Take 1 tablet (10 mg total) by mouth daily.  30 tablet  1  . carvedilol (COREG) 25 MG tablet Take 1 tablet (25 mg total) by mouth 2 (two) times daily with a meal.  60 tablet  1  . clopidogrel (PLAVIX) 75 MG tablet Take 1 tablet (75 mg total) by mouth daily with breakfast.  30 tablet  1  . glyBURIDE (DIABETA) 5 MG tablet Take 2.5 mg by mouth daily with breakfast.      . hydrALAZINE (APRESOLINE) 10 MG tablet Take 10 mg by mouth 3 (three) times daily.       Marland Kitchen lisinopril (PRINIVIL,ZESTRIL) 40 MG tablet Take 1 tablet (  40 mg total) by mouth daily.  30 tablet  1  . metFORMIN (GLUCOPHAGE-XR) 750 MG 24 hr tablet Take 750 mg by mouth daily with breakfast.      . cloNIDine (CATAPRES) 0.3 MG tablet Take 1 tablet (0.3 mg total) by mouth 3 (three) times daily.  90 tablet  1  . hydrochlorothiazide (MICROZIDE) 12.5 MG capsule Take 2 capsules (25 mg total) by mouth daily.  90 capsule  3   No  facility-administered medications prior to visit.   PHYSICAL EXAM  Filed Vitals:   09/01/13 0909  BP: 161/90  Pulse: 66  Height: 6\' 3"  (1.905 m)  Weight: 251 lb (113.853 kg)   Body mass index is 31.37 kg/(m^2).  Generalized: Well developed, in no acute distress  Head: normocephalic and atraumatic. Oropharynx benign  Neck: Supple, no carotid bruits  Cardiac: Regular rate rhythm, no murmur  Musculoskeletal: No deformity   Neurological examination  Mentation: Alert oriented to time, place, history taking. Follows all commands speech and language fluent. Cranial nerve II-XII: Fundoscopic exam not done.  Pupils were equal round reactive to light extraocular movements were full, visual field were full on confrontational test. Facial sensation is normal bilaterally. There is mild left lower facial weakness. Hearing was intact to finger rubbing bilaterally. Uvula tongue midline. Head turning and shoulder shrug are decreased on the left. Motor: The motor testing reveals 5 over 5 strength in the right upper and right lower extremity. 3/5 strength in the left upper and lower extremity. Left hand grip absent. Sensory: Sensory testing is decreased on the left to pinprick, soft touch, vibration sensation, and position sense.  Evidence of extinction is noted on the left.  Coordination: Cerebellar testing reveals ataxia on the left, normal on the right.  Gait and station: Gait is not tested. In wheelchair. Reflexes: Deep tendon reflexes are increased on the left.  NIHSS: 6 MRS: 4   DIAGNOSTIC DATA (LABS, IMAGING, TESTING) - I reviewed patient records, labs, notes, testing and imaging myself where available.  Lab Results  Component Value Date   HGBA1C 6.2* 06/03/2013    ASSESSMENT AND PLAN 68 y.o. year old male  has a past medical history of Hypertension; Diabetes mellitus without complication; Hyperlipemia; and [name] right middle cerebral artery infarct, embolic (8/0/0349) here with for stroke  follow up.  He had a loop recorder implanted on 06/07/13, which has not shown atrial fibrillation yet.  He has residual left spastic hemiplegia.  PLAN: Continue clopidogrel 75 mg orally every day  for secondary stroke prevention and maintain strict control of hypertension with blood pressure goal below 130/90, diabetes with hemoglobin A1c goal below 6.5% and lipids with LDL cholesterol goal below 100 mg/dL.  Followup in the future with Dr. Leonie Man in 3 months.  Philmore Pali, MSN, NP-C 09/01/2013, 1:05 PM [facility] [address] [place], [place] [address] [phone]  Note: This document was prepared with digital dictation and possible smart phrase technology. Any transcriptional errors that result from this process are unintentional.

## 2013-09-07 ENCOUNTER — Encounter: Payer: Self-pay | Admitting: Internal Medicine

## 2013-09-08 ENCOUNTER — Ambulatory Visit: Payer: Medicare Other | Attending: Physical Medicine & Rehabilitation

## 2013-09-08 ENCOUNTER — Ambulatory Visit: Payer: Medicare Other

## 2013-09-08 ENCOUNTER — Encounter: Payer: Self-pay | Admitting: Internal Medicine

## 2013-09-08 ENCOUNTER — Ambulatory Visit (INDEPENDENT_AMBULATORY_CARE_PROVIDER_SITE_OTHER): Payer: Medicare Other | Admitting: *Deleted

## 2013-09-08 DIAGNOSIS — R262 Difficulty in walking, not elsewhere classified: Secondary | ICD-10-CM | POA: Diagnosis not present

## 2013-09-08 DIAGNOSIS — R279 Unspecified lack of coordination: Secondary | ICD-10-CM | POA: Diagnosis not present

## 2013-09-08 DIAGNOSIS — I6350 Cerebral infarction due to unspecified occlusion or stenosis of unspecified cerebral artery: Secondary | ICD-10-CM

## 2013-09-08 DIAGNOSIS — R293 Abnormal posture: Secondary | ICD-10-CM | POA: Insufficient documentation

## 2013-09-08 DIAGNOSIS — I639 Cerebral infarction, unspecified: Secondary | ICD-10-CM

## 2013-09-08 DIAGNOSIS — Z5189 Encounter for other specified aftercare: Secondary | ICD-10-CM | POA: Diagnosis not present

## 2013-09-12 ENCOUNTER — Ambulatory Visit: Payer: Medicare Other

## 2013-09-12 DIAGNOSIS — R293 Abnormal posture: Secondary | ICD-10-CM | POA: Diagnosis not present

## 2013-09-12 DIAGNOSIS — Z5189 Encounter for other specified aftercare: Secondary | ICD-10-CM | POA: Diagnosis not present

## 2013-09-12 DIAGNOSIS — R279 Unspecified lack of coordination: Secondary | ICD-10-CM | POA: Diagnosis not present

## 2013-09-12 DIAGNOSIS — R262 Difficulty in walking, not elsewhere classified: Secondary | ICD-10-CM | POA: Diagnosis not present

## 2013-09-13 ENCOUNTER — Encounter: Payer: Medicare Other | Admitting: *Deleted

## 2013-09-14 ENCOUNTER — Ambulatory Visit: Payer: Medicare Other

## 2013-09-14 DIAGNOSIS — R293 Abnormal posture: Secondary | ICD-10-CM | POA: Diagnosis not present

## 2013-09-14 DIAGNOSIS — R262 Difficulty in walking, not elsewhere classified: Secondary | ICD-10-CM | POA: Diagnosis not present

## 2013-09-14 DIAGNOSIS — Z5189 Encounter for other specified aftercare: Secondary | ICD-10-CM | POA: Diagnosis not present

## 2013-09-14 DIAGNOSIS — R279 Unspecified lack of coordination: Secondary | ICD-10-CM | POA: Diagnosis not present

## 2013-09-15 ENCOUNTER — Ambulatory Visit: Payer: Medicare Other | Admitting: *Deleted

## 2013-09-15 DIAGNOSIS — R279 Unspecified lack of coordination: Secondary | ICD-10-CM | POA: Diagnosis not present

## 2013-09-15 DIAGNOSIS — Z5189 Encounter for other specified aftercare: Secondary | ICD-10-CM | POA: Diagnosis not present

## 2013-09-15 DIAGNOSIS — R293 Abnormal posture: Secondary | ICD-10-CM | POA: Diagnosis not present

## 2013-09-15 DIAGNOSIS — R262 Difficulty in walking, not elsewhere classified: Secondary | ICD-10-CM | POA: Diagnosis not present

## 2013-09-16 ENCOUNTER — Ambulatory Visit: Payer: Medicare Other | Admitting: Occupational Therapy

## 2013-09-18 LAB — MDC_IDC_ENUM_SESS_TYPE_REMOTE

## 2013-09-19 ENCOUNTER — Ambulatory Visit: Payer: Medicare Other | Admitting: *Deleted

## 2013-09-19 ENCOUNTER — Ambulatory Visit: Payer: Medicare Other

## 2013-09-19 DIAGNOSIS — Z5189 Encounter for other specified aftercare: Secondary | ICD-10-CM | POA: Diagnosis not present

## 2013-09-19 DIAGNOSIS — R262 Difficulty in walking, not elsewhere classified: Secondary | ICD-10-CM | POA: Diagnosis not present

## 2013-09-19 DIAGNOSIS — R293 Abnormal posture: Secondary | ICD-10-CM | POA: Diagnosis not present

## 2013-09-19 DIAGNOSIS — R279 Unspecified lack of coordination: Secondary | ICD-10-CM | POA: Diagnosis not present

## 2013-09-23 ENCOUNTER — Ambulatory Visit: Payer: Medicare Other

## 2013-09-23 ENCOUNTER — Ambulatory Visit: Payer: Medicare Other | Admitting: Occupational Therapy

## 2013-09-23 DIAGNOSIS — R279 Unspecified lack of coordination: Secondary | ICD-10-CM | POA: Diagnosis not present

## 2013-09-23 DIAGNOSIS — R262 Difficulty in walking, not elsewhere classified: Secondary | ICD-10-CM | POA: Diagnosis not present

## 2013-09-23 DIAGNOSIS — Z5189 Encounter for other specified aftercare: Secondary | ICD-10-CM | POA: Diagnosis not present

## 2013-09-23 DIAGNOSIS — R293 Abnormal posture: Secondary | ICD-10-CM | POA: Diagnosis not present

## 2013-09-26 ENCOUNTER — Ambulatory Visit: Payer: Medicare Other

## 2013-09-26 ENCOUNTER — Encounter: Payer: Medicare Other | Admitting: *Deleted

## 2013-09-26 DIAGNOSIS — R279 Unspecified lack of coordination: Secondary | ICD-10-CM | POA: Diagnosis not present

## 2013-09-26 DIAGNOSIS — R262 Difficulty in walking, not elsewhere classified: Secondary | ICD-10-CM | POA: Diagnosis not present

## 2013-09-26 DIAGNOSIS — R293 Abnormal posture: Secondary | ICD-10-CM | POA: Diagnosis not present

## 2013-09-26 DIAGNOSIS — Z5189 Encounter for other specified aftercare: Secondary | ICD-10-CM | POA: Diagnosis not present

## 2013-09-28 ENCOUNTER — Ambulatory Visit: Payer: Medicare Other | Admitting: Occupational Therapy

## 2013-09-28 DIAGNOSIS — R293 Abnormal posture: Secondary | ICD-10-CM | POA: Diagnosis not present

## 2013-09-28 DIAGNOSIS — R262 Difficulty in walking, not elsewhere classified: Secondary | ICD-10-CM | POA: Diagnosis not present

## 2013-09-28 DIAGNOSIS — R279 Unspecified lack of coordination: Secondary | ICD-10-CM | POA: Diagnosis not present

## 2013-09-28 DIAGNOSIS — Z5189 Encounter for other specified aftercare: Secondary | ICD-10-CM | POA: Diagnosis not present

## 2013-09-30 ENCOUNTER — Ambulatory Visit: Payer: Medicare Other | Admitting: Physical Therapy

## 2013-09-30 ENCOUNTER — Ambulatory Visit: Payer: Medicare Other | Attending: Physical Medicine & Rehabilitation | Admitting: Occupational Therapy

## 2013-09-30 DIAGNOSIS — R279 Unspecified lack of coordination: Secondary | ICD-10-CM | POA: Diagnosis not present

## 2013-09-30 DIAGNOSIS — R293 Abnormal posture: Secondary | ICD-10-CM | POA: Diagnosis not present

## 2013-09-30 DIAGNOSIS — Z5189 Encounter for other specified aftercare: Secondary | ICD-10-CM | POA: Diagnosis not present

## 2013-09-30 DIAGNOSIS — R262 Difficulty in walking, not elsewhere classified: Secondary | ICD-10-CM | POA: Insufficient documentation

## 2013-10-03 ENCOUNTER — Encounter: Payer: Medicare Other | Attending: Physical Medicine & Rehabilitation

## 2013-10-03 ENCOUNTER — Ambulatory Visit (HOSPITAL_BASED_OUTPATIENT_CLINIC_OR_DEPARTMENT_OTHER): Payer: Medicare Other | Admitting: Physical Medicine & Rehabilitation

## 2013-10-03 ENCOUNTER — Encounter: Payer: Self-pay | Admitting: Physical Medicine & Rehabilitation

## 2013-10-03 VITALS — BP 145/64 | HR 57 | Resp 14 | Ht 75.0 in | Wt 251.0 lb

## 2013-10-03 DIAGNOSIS — I10 Essential (primary) hypertension: Secondary | ICD-10-CM | POA: Diagnosis not present

## 2013-10-03 DIAGNOSIS — I6340 Cerebral infarction due to embolism of unspecified cerebral artery: Secondary | ICD-10-CM

## 2013-10-03 DIAGNOSIS — G8110 Spastic hemiplegia affecting unspecified side: Secondary | ICD-10-CM | POA: Diagnosis not present

## 2013-10-03 DIAGNOSIS — I69959 Hemiplegia and hemiparesis following unspecified cerebrovascular disease affecting unspecified side: Secondary | ICD-10-CM | POA: Diagnosis not present

## 2013-10-03 NOTE — Patient Instructions (Signed)
Followup with dentist  Biting  lip is most likely secondary to poor sensation

## 2013-10-03 NOTE — Progress Notes (Signed)
Subjective:    Patient ID: Donald [name], male    DOB: 07-23-1945, 68 y.o.   MRN: [id number]  HPI Working on L knee hyperext Using AFO, strengthening quads in therapy Wrist strain improved No wrist pain  Notes some problems with adjustment. Both he and his wife are struggling with adjustment to new circumstances including new city  Has another one month of outpatient PT and OT scheduled  Concerns about grinding teeth biting lip, following up with dentistry  Swelling left hand improved   Pain Inventory Average Pain 0 Pain Right Now 0 My pain is no pain  In the last 24 hours, has pain interfered with the following? General activity 0 Relation with others 0 Enjoyment of life 0 What TIME of day is your pain at its worst? no pain Sleep (in general) Good  Pain is worse with: no pain Pain improves with: no pain Relief from Meds: no pain  Mobility use a walker how many minutes can you walk? 20 ability to climb steps?  no do you drive?  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  no  Physicians involved in your care Any changes since last visit?  no   Family History  Problem Relation Age of Onset  . Dementia Mother   . Diabetes Mother   . Heart attack Father    History   Social History  . Marital Status: Married    Spouse Name: [name]    Number of Children: 2  . Years of Education: college   Occupational History  .     Social History Main Topics  . Smoking status: Former Research scientist (life sciences)  . Smokeless tobacco: Never Used     Comment: Quit 25 years ago.  . Alcohol Use: 0.6 oz/week    1 Glasses of wine per week     Comment: Rare  . Drug Use: No  . Sexual Activity: No   Other Topics Concern  . None   Social History  Narrative   Patient lives at home [place]).   Retired works part time 10 hours a week.   Right handed   Education college   Caffeine one cup daily coffee   Past Surgical History  Procedure Laterality Date  . Tee without cardioversion N/A 06/07/2013    Procedure: TRANSESOPHAGEAL ECHOCARDIOGRAM (TEE);  Surgeon: [name], MD;  Location: [facility] ENDOSCOPY;  Service: Cardiovascular;  Laterality: N/A;  . 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 Hypertension   . Diabetes mellitus without complication   . Hyperlipemia   . large right middle cerebral artery infarct, embolic 02/06/262    a. s/p IV tPA, b. source unknown, c. loop recorder placed   BP 145/64  Pulse 57  Resp 14  Ht 6\' 3"  (1.905 m)  Wt 251 lb (113.853 kg)  BMI 31.37 kg/m2  SpO2 97%  Opioid Risk Score:   Fall Risk Score: Moderate Fall Risk (6-13 points) (educated and handout given on fall prevention in the home at previous appt) Review of Systems  Musculoskeletal: Positive for gait problem.  Neurological: Positive for weakness.  All other systems reviewed and are negative.      Objective:  Physical Exam  No shoulder pain with ROM   No Clonus at fingers Motor strength 3 + deltoids biceps triceps grip  4 minus left hip flexion 4+ left knee extension, 3 minus ankle dorsiflexion and plantar flexion, 2 minus toe flexion-extension  2+ edema left dorsal hand and wrist  Right-sided strength normal  Thumb opposition cannot oppose little finger on the left  Mood and affect are appropriate  Speech without evidence of dysarthria Sensation absent to light touch in the left hand Reduced light touch as well as proprioception sensation in the left great toe. Intact to light touch in the left little toe      Assessment & Plan:   1. R MCA infarct Left HP and Hemisensory deficits, continue outpatient PT and OT. Discussed multiple  issues with patient and his wife.  Make referral to neuropsychology for adjustment issues No need for Botox or spasticity medication at the current time   2. HTN per PCP 3. Left ulnar neuropathy, resolved 4. Left wrist synovitis improved after splinting Return to clinic one month Over half of the 25 min visit was spent counseling and coordinating care.

## 2013-10-04 ENCOUNTER — Ambulatory Visit: Payer: Medicare Other | Admitting: Occupational Therapy

## 2013-10-04 ENCOUNTER — Ambulatory Visit: Payer: Medicare Other

## 2013-10-04 ENCOUNTER — Encounter: Payer: Self-pay | Admitting: Internal Medicine

## 2013-10-06 ENCOUNTER — Ambulatory Visit: Payer: Medicare Other | Admitting: Occupational Therapy

## 2013-10-06 ENCOUNTER — Ambulatory Visit: Payer: Medicare Other | Admitting: Physical Therapy

## 2013-10-10 ENCOUNTER — Ambulatory Visit (INDEPENDENT_AMBULATORY_CARE_PROVIDER_SITE_OTHER): Payer: Medicare Other | Admitting: *Deleted

## 2013-10-10 ENCOUNTER — Ambulatory Visit: Payer: Medicare Other | Admitting: Occupational Therapy

## 2013-10-10 ENCOUNTER — Ambulatory Visit: Payer: Medicare Other | Admitting: Physical Therapy

## 2013-10-10 DIAGNOSIS — I6350 Cerebral infarction due to unspecified occlusion or stenosis of unspecified cerebral artery: Secondary | ICD-10-CM | POA: Diagnosis not present

## 2013-10-10 DIAGNOSIS — I639 Cerebral infarction, unspecified: Secondary | ICD-10-CM

## 2013-10-11 ENCOUNTER — Encounter: Payer: Self-pay | Admitting: Internal Medicine

## 2013-10-12 ENCOUNTER — Ambulatory Visit: Payer: Medicare Other

## 2013-10-12 ENCOUNTER — Ambulatory Visit: Payer: Medicare Other | Admitting: Occupational Therapy

## 2013-10-12 DIAGNOSIS — R799 Abnormal finding of blood chemistry, unspecified: Secondary | ICD-10-CM | POA: Diagnosis not present

## 2013-10-12 DIAGNOSIS — I10 Essential (primary) hypertension: Secondary | ICD-10-CM | POA: Diagnosis not present

## 2013-10-12 DIAGNOSIS — I6350 Cerebral infarction due to unspecified occlusion or stenosis of unspecified cerebral artery: Secondary | ICD-10-CM | POA: Diagnosis not present

## 2013-10-12 DIAGNOSIS — E119 Type 2 diabetes mellitus without complications: Secondary | ICD-10-CM | POA: Diagnosis not present

## 2013-10-19 ENCOUNTER — Encounter: Payer: Medicare Other | Admitting: Occupational Therapy

## 2013-10-19 ENCOUNTER — Ambulatory Visit: Payer: Medicare Other | Admitting: Physical Therapy

## 2013-10-20 ENCOUNTER — Ambulatory Visit: Payer: Medicare Other | Admitting: Occupational Therapy

## 2013-10-20 ENCOUNTER — Ambulatory Visit: Payer: Medicare Other

## 2013-10-21 ENCOUNTER — Ambulatory Visit: Payer: Medicare Other | Admitting: Occupational Therapy

## 2013-10-21 ENCOUNTER — Ambulatory Visit: Payer: Medicare Other

## 2013-10-24 LAB — MDC_IDC_ENUM_SESS_TYPE_REMOTE

## 2013-10-26 ENCOUNTER — Ambulatory Visit: Payer: Medicare Other | Admitting: Occupational Therapy

## 2013-10-26 ENCOUNTER — Ambulatory Visit: Payer: Medicare Other | Admitting: Physical Therapy

## 2013-10-28 ENCOUNTER — Ambulatory Visit: Payer: Medicare Other | Admitting: Occupational Therapy

## 2013-10-28 ENCOUNTER — Ambulatory Visit: Payer: Medicare Other | Admitting: Physical Therapy

## 2013-11-01 DIAGNOSIS — I129 Hypertensive chronic kidney disease with stage 1 through stage 4 chronic kidney disease, or unspecified chronic kidney disease: Secondary | ICD-10-CM | POA: Diagnosis not present

## 2013-11-02 ENCOUNTER — Ambulatory Visit: Payer: Medicare Other | Attending: Physical Medicine & Rehabilitation | Admitting: Occupational Therapy

## 2013-11-02 ENCOUNTER — Ambulatory Visit: Payer: Medicare Other

## 2013-11-02 DIAGNOSIS — R279 Unspecified lack of coordination: Secondary | ICD-10-CM | POA: Insufficient documentation

## 2013-11-02 DIAGNOSIS — Z5189 Encounter for other specified aftercare: Secondary | ICD-10-CM | POA: Insufficient documentation

## 2013-11-02 DIAGNOSIS — R262 Difficulty in walking, not elsewhere classified: Secondary | ICD-10-CM | POA: Diagnosis not present

## 2013-11-02 DIAGNOSIS — R293 Abnormal posture: Secondary | ICD-10-CM | POA: Diagnosis not present

## 2013-11-04 ENCOUNTER — Encounter: Payer: Self-pay | Admitting: Neurology

## 2013-11-04 ENCOUNTER — Ambulatory Visit: Payer: Medicare Other

## 2013-11-04 ENCOUNTER — Telehealth: Payer: Self-pay | Admitting: Neurology

## 2013-11-04 ENCOUNTER — Ambulatory Visit: Payer: Medicare Other | Admitting: Occupational Therapy

## 2013-11-04 DIAGNOSIS — Z5189 Encounter for other specified aftercare: Secondary | ICD-10-CM | POA: Diagnosis not present

## 2013-11-04 DIAGNOSIS — R293 Abnormal posture: Secondary | ICD-10-CM | POA: Diagnosis not present

## 2013-11-04 DIAGNOSIS — R262 Difficulty in walking, not elsewhere classified: Secondary | ICD-10-CM | POA: Diagnosis not present

## 2013-11-04 DIAGNOSIS — R279 Unspecified lack of coordination: Secondary | ICD-10-CM | POA: Diagnosis not present

## 2013-11-04 NOTE — Telephone Encounter (Signed)
Left message for patient regarding rescheduling 12/01/13 appointment per Dr. Clydene Fake schedule. Printed and mailed letter with new appointment time.

## 2013-11-07 ENCOUNTER — Encounter: Payer: Medicare Other | Attending: Physical Medicine & Rehabilitation

## 2013-11-07 ENCOUNTER — Ambulatory Visit (HOSPITAL_BASED_OUTPATIENT_CLINIC_OR_DEPARTMENT_OTHER): Payer: Medicare Other | Admitting: Physical Medicine & Rehabilitation

## 2013-11-07 ENCOUNTER — Encounter: Payer: Self-pay | Admitting: Physical Medicine & Rehabilitation

## 2013-11-07 VITALS — BP 142/70 | HR 62 | Resp 14 | Ht 75.0 in | Wt 251.0 lb

## 2013-11-07 DIAGNOSIS — I6340 Cerebral infarction due to embolism of unspecified cerebral artery: Secondary | ICD-10-CM

## 2013-11-07 DIAGNOSIS — I10 Essential (primary) hypertension: Secondary | ICD-10-CM | POA: Insufficient documentation

## 2013-11-07 DIAGNOSIS — R209 Unspecified disturbances of skin sensation: Secondary | ICD-10-CM | POA: Diagnosis not present

## 2013-11-07 DIAGNOSIS — G8110 Spastic hemiplegia affecting unspecified side: Secondary | ICD-10-CM | POA: Diagnosis not present

## 2013-11-07 DIAGNOSIS — I69998 Other sequelae following unspecified cerebrovascular disease: Secondary | ICD-10-CM | POA: Diagnosis not present

## 2013-11-07 DIAGNOSIS — H539 Unspecified visual disturbance: Secondary | ICD-10-CM

## 2013-11-07 DIAGNOSIS — I69959 Hemiplegia and hemiparesis following unspecified cerebrovascular disease affecting unspecified side: Secondary | ICD-10-CM | POA: Insufficient documentation

## 2013-11-07 NOTE — Patient Instructions (Signed)
Swedish knee cage trial  Strengthen quads Ankle weights

## 2013-11-07 NOTE — Progress Notes (Signed)
Subjective:    Patient ID: Donald [name], male    DOB: Dec 29, 1945, 68 y.o.   MRN: [id number] Right MCA infarct 06/03/2013  CIR 06/07/2013 through 07/02/2013 HPI Working on L knee hyperext  Using AFO, strengthening quads in therapy  Wrist strain improved  No wrist pain  Notes some problems with adjustment. Both he and his wife are struggling with adjustment to new circumstances including new city  Has another one month of outpatient PT and OT scheduled   Still with loop recorder, negative for Afib  Swelling left hand improved  Grip strength improved from 8 to 28 lbs amb 400-500 ft with cane Tried walking short distance without device Seen by nephrologist for elevated Creat, no new treatments recommended  ADLs Modified Independent but very slow, uses tub bench with supervision  Standing tolerance improved from 10 sec to 47min 30 sec  Pain Inventory Average Pain 0 Pain Right Now 0 My pain is n/a  In the last 24 hours, has pain interfered with the following? General activity 0 Relation with others 0 Enjoyment of life 0 What TIME of day is your pain at its worst? n/a Sleep (in general) Good  Pain is worse with: n/a Pain improves with: n/a Relief from Meds: n/a  Mobility use a walker ability to climb steps?  no do you drive?  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  yes  Neuro/Psych trouble walking  Prior Studies Any changes since last visit?  no  Physicians involved in your care Any changes since last visit?  no   Family History  Problem Relation Age of Onset  . Dementia Mother   . Diabetes Mother   . Heart attack Father    History   Social History  . Marital Status: Married    Spouse Name: [name]    Number of Children: 2  . Years of Education:  college   Occupational History  .     Social History Main Topics  . Smoking status: Former Research scientist (life sciences)  . Smokeless tobacco: Never Used     Comment: Quit 25 years ago.  . Alcohol Use: 0.6 oz/week    1 Glasses of wine per week     Comment: Rare  . Drug Use: No  . Sexual Activity: No   Other Topics Concern  . None   Social History Narrative   Patient lives at home [place]).   Retired works part time 10 hours a week.   Right handed   Education college   Caffeine one cup daily coffee   Past Surgical History  Procedure Laterality Date  . Tee without cardioversion N/A 06/07/2013    Procedure: TRANSESOPHAGEAL ECHOCARDIOGRAM (TEE);  Surgeon: [name], MD;  Location: [facility] ENDOSCOPY;  Service: Cardiovascular;  Laterality: N/A;  . 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 Hypertension   . Diabetes mellitus without complication   . Hyperlipemia   . large right middle cerebral artery infarct, embolic 0/06/270    a. s/p IV tPA, b. source unknown, c. loop recorder placed   BP 142/70  Pulse 62  Resp 14  Ht 6\' 3"  (1.905 m)  Wt 251 lb (113.853 kg)  BMI 31.37 kg/m2  SpO2 97%  Opioid Risk Score:   Fall Risk Score: Moderate Fall Risk (6-13 points) (patient educated handout declined)   Review of Systems  Musculoskeletal: Positive for gait problem.  All other systems reviewed and are negative.      Objective:   Physical Exam  No shoulder pain with ROM  No Clonus at fingers , MAS1 at finger and wrist flexors Motor strength 3 + deltoids biceps triceps grip  4 minus left hip flexion 4+ left knee extension, 3 minus ankle dorsiflexion and plantar flexion, 3 minus toe flexion-extension  no edema left dorsal hand and wrist  Right-sided strength normal  Thumb opposition cannot oppose little finger on the left  Mood and affect are appropriate  Speech without evidence of dysarthria    Sensation  light touch intact in the left hand  Reduced light touch as well as proprioception sensation in the left great toe.       Assessment & Plan:   1. R MCA infarct Left HP and Hemisensory deficits, continue outpatient PT and OT. Discussed multiple issues with patient and his wife. Overall has been making improvements with left upper extremity sensation as well as left upper extremity and left lower extremity strength  No need for Botox or spasticity medication at the current time  2. HTN per PCP  3. Left ulnar neuropathy, resolved  4. Left wrist synovitis improved after splinting  5. Concerned about vision. Did have some left neglect related to his right MCA infarct question if there's any other visual impairment related to stroke or related to diabetes. Will refer to neuro ophthalmologist 6. Concerned about grinding teeth. There is no dentist who is specializing inpatients with neurologic disease. Patient will get referral for general dentist from primary care Return to clinic one month  Over half of the 25 min visit was spent counseling and coordinating care.

## 2013-11-08 ENCOUNTER — Ambulatory Visit: Payer: Medicare Other | Admitting: Occupational Therapy

## 2013-11-08 ENCOUNTER — Ambulatory Visit: Payer: Medicare Other

## 2013-11-08 ENCOUNTER — Ambulatory Visit (INDEPENDENT_AMBULATORY_CARE_PROVIDER_SITE_OTHER): Payer: Medicare Other | Admitting: *Deleted

## 2013-11-08 DIAGNOSIS — Z5189 Encounter for other specified aftercare: Secondary | ICD-10-CM | POA: Diagnosis not present

## 2013-11-08 DIAGNOSIS — R293 Abnormal posture: Secondary | ICD-10-CM | POA: Diagnosis not present

## 2013-11-08 DIAGNOSIS — I639 Cerebral infarction, unspecified: Secondary | ICD-10-CM

## 2013-11-08 DIAGNOSIS — R262 Difficulty in walking, not elsewhere classified: Secondary | ICD-10-CM | POA: Diagnosis not present

## 2013-11-08 DIAGNOSIS — I6350 Cerebral infarction due to unspecified occlusion or stenosis of unspecified cerebral artery: Secondary | ICD-10-CM | POA: Diagnosis not present

## 2013-11-08 DIAGNOSIS — R279 Unspecified lack of coordination: Secondary | ICD-10-CM | POA: Diagnosis not present

## 2013-11-11 ENCOUNTER — Ambulatory Visit: Payer: Medicare Other | Admitting: Physical Therapy

## 2013-11-11 ENCOUNTER — Ambulatory Visit: Payer: Medicare Other

## 2013-11-11 DIAGNOSIS — R293 Abnormal posture: Secondary | ICD-10-CM | POA: Diagnosis not present

## 2013-11-11 DIAGNOSIS — Z5189 Encounter for other specified aftercare: Secondary | ICD-10-CM | POA: Diagnosis not present

## 2013-11-11 DIAGNOSIS — R262 Difficulty in walking, not elsewhere classified: Secondary | ICD-10-CM | POA: Diagnosis not present

## 2013-11-11 DIAGNOSIS — R279 Unspecified lack of coordination: Secondary | ICD-10-CM | POA: Diagnosis not present

## 2013-11-14 ENCOUNTER — Ambulatory Visit: Payer: Medicare Other

## 2013-11-14 ENCOUNTER — Ambulatory Visit: Payer: Medicare Other | Admitting: Occupational Therapy

## 2013-11-14 DIAGNOSIS — R262 Difficulty in walking, not elsewhere classified: Secondary | ICD-10-CM | POA: Diagnosis not present

## 2013-11-14 DIAGNOSIS — Z5189 Encounter for other specified aftercare: Secondary | ICD-10-CM | POA: Diagnosis not present

## 2013-11-14 DIAGNOSIS — R293 Abnormal posture: Secondary | ICD-10-CM | POA: Diagnosis not present

## 2013-11-14 DIAGNOSIS — R279 Unspecified lack of coordination: Secondary | ICD-10-CM | POA: Diagnosis not present

## 2013-11-15 ENCOUNTER — Ambulatory Visit: Payer: Medicare Other

## 2013-11-18 ENCOUNTER — Ambulatory Visit: Payer: Medicare Other

## 2013-11-18 ENCOUNTER — Ambulatory Visit: Payer: Medicare Other | Admitting: Occupational Therapy

## 2013-11-18 DIAGNOSIS — R293 Abnormal posture: Secondary | ICD-10-CM | POA: Diagnosis not present

## 2013-11-18 DIAGNOSIS — R279 Unspecified lack of coordination: Secondary | ICD-10-CM | POA: Diagnosis not present

## 2013-11-18 DIAGNOSIS — R262 Difficulty in walking, not elsewhere classified: Secondary | ICD-10-CM | POA: Diagnosis not present

## 2013-11-18 DIAGNOSIS — Z5189 Encounter for other specified aftercare: Secondary | ICD-10-CM | POA: Diagnosis not present

## 2013-11-21 LAB — MDC_IDC_ENUM_SESS_TYPE_REMOTE

## 2013-11-22 ENCOUNTER — Ambulatory Visit: Payer: Medicare Other | Admitting: Physical Therapy

## 2013-11-22 ENCOUNTER — Ambulatory Visit: Payer: Medicare Other | Admitting: Occupational Therapy

## 2013-11-22 DIAGNOSIS — R262 Difficulty in walking, not elsewhere classified: Secondary | ICD-10-CM | POA: Diagnosis not present

## 2013-11-22 DIAGNOSIS — R279 Unspecified lack of coordination: Secondary | ICD-10-CM | POA: Diagnosis not present

## 2013-11-22 DIAGNOSIS — R293 Abnormal posture: Secondary | ICD-10-CM | POA: Diagnosis not present

## 2013-11-22 DIAGNOSIS — Z5189 Encounter for other specified aftercare: Secondary | ICD-10-CM | POA: Diagnosis not present

## 2013-11-24 ENCOUNTER — Ambulatory Visit: Payer: Medicare Other

## 2013-11-24 ENCOUNTER — Ambulatory Visit: Payer: Medicare Other | Admitting: Occupational Therapy

## 2013-11-24 ENCOUNTER — Telehealth: Payer: Self-pay

## 2013-11-24 DIAGNOSIS — R293 Abnormal posture: Secondary | ICD-10-CM | POA: Diagnosis not present

## 2013-11-24 DIAGNOSIS — R279 Unspecified lack of coordination: Secondary | ICD-10-CM | POA: Diagnosis not present

## 2013-11-24 DIAGNOSIS — R262 Difficulty in walking, not elsewhere classified: Secondary | ICD-10-CM | POA: Diagnosis not present

## 2013-11-24 DIAGNOSIS — Z5189 Encounter for other specified aftercare: Secondary | ICD-10-CM | POA: Diagnosis not present

## 2013-11-24 NOTE — Telephone Encounter (Signed)
Ivin Booty with [facility] called to get patients most recent A1C results.  She can be reached at [phone] ext [phone].

## 2013-11-24 NOTE — Telephone Encounter (Signed)
Notified Donald [name] that we do not follow glucose control from this office.  His previous A1c was in January and was 6.2.

## 2013-11-28 ENCOUNTER — Ambulatory Visit: Payer: Medicare Other | Admitting: Nurse Practitioner

## 2013-11-29 ENCOUNTER — Ambulatory Visit: Payer: Medicare Other | Admitting: Occupational Therapy

## 2013-11-29 ENCOUNTER — Ambulatory Visit: Payer: Medicare Other | Admitting: Physical Therapy

## 2013-11-29 DIAGNOSIS — R293 Abnormal posture: Secondary | ICD-10-CM | POA: Diagnosis not present

## 2013-11-29 DIAGNOSIS — Z5189 Encounter for other specified aftercare: Secondary | ICD-10-CM | POA: Diagnosis not present

## 2013-11-29 DIAGNOSIS — R262 Difficulty in walking, not elsewhere classified: Secondary | ICD-10-CM | POA: Diagnosis not present

## 2013-11-29 DIAGNOSIS — R279 Unspecified lack of coordination: Secondary | ICD-10-CM | POA: Diagnosis not present

## 2013-12-01 ENCOUNTER — Ambulatory Visit: Payer: Medicare Other | Attending: Physical Medicine & Rehabilitation | Admitting: Occupational Therapy

## 2013-12-01 ENCOUNTER — Ambulatory Visit: Payer: Medicare Other | Admitting: Neurology

## 2013-12-01 ENCOUNTER — Ambulatory Visit: Payer: Medicare Other | Admitting: Physical Therapy

## 2013-12-01 DIAGNOSIS — Z5189 Encounter for other specified aftercare: Secondary | ICD-10-CM | POA: Insufficient documentation

## 2013-12-01 DIAGNOSIS — R293 Abnormal posture: Secondary | ICD-10-CM | POA: Diagnosis not present

## 2013-12-01 DIAGNOSIS — R279 Unspecified lack of coordination: Secondary | ICD-10-CM | POA: Diagnosis not present

## 2013-12-01 DIAGNOSIS — R262 Difficulty in walking, not elsewhere classified: Secondary | ICD-10-CM | POA: Insufficient documentation

## 2013-12-06 ENCOUNTER — Ambulatory Visit: Payer: Medicare Other | Admitting: Occupational Therapy

## 2013-12-06 ENCOUNTER — Ambulatory Visit: Payer: Medicare Other

## 2013-12-06 ENCOUNTER — Encounter: Payer: Self-pay | Admitting: Internal Medicine

## 2013-12-06 ENCOUNTER — Ambulatory Visit: Payer: Medicare Other | Admitting: Physical Therapy

## 2013-12-06 DIAGNOSIS — Z5189 Encounter for other specified aftercare: Secondary | ICD-10-CM | POA: Diagnosis not present

## 2013-12-07 DIAGNOSIS — I10 Essential (primary) hypertension: Secondary | ICD-10-CM | POA: Diagnosis not present

## 2013-12-07 DIAGNOSIS — E119 Type 2 diabetes mellitus without complications: Secondary | ICD-10-CM | POA: Diagnosis not present

## 2013-12-07 DIAGNOSIS — I6350 Cerebral infarction due to unspecified occlusion or stenosis of unspecified cerebral artery: Secondary | ICD-10-CM | POA: Diagnosis not present

## 2013-12-08 ENCOUNTER — Ambulatory Visit: Payer: Medicare Other | Admitting: Physical Therapy

## 2013-12-08 DIAGNOSIS — Z5189 Encounter for other specified aftercare: Secondary | ICD-10-CM | POA: Diagnosis not present

## 2013-12-09 ENCOUNTER — Ambulatory Visit (INDEPENDENT_AMBULATORY_CARE_PROVIDER_SITE_OTHER): Payer: Medicare Other | Admitting: *Deleted

## 2013-12-09 DIAGNOSIS — I6340 Cerebral infarction due to embolism of unspecified cerebral artery: Secondary | ICD-10-CM | POA: Diagnosis not present

## 2013-12-09 DIAGNOSIS — I6350 Cerebral infarction due to unspecified occlusion or stenosis of unspecified cerebral artery: Secondary | ICD-10-CM

## 2013-12-09 DIAGNOSIS — H538 Other visual disturbances: Secondary | ICD-10-CM | POA: Diagnosis not present

## 2013-12-09 DIAGNOSIS — I639 Cerebral infarction, unspecified: Secondary | ICD-10-CM

## 2013-12-09 DIAGNOSIS — E119 Type 2 diabetes mellitus without complications: Secondary | ICD-10-CM | POA: Diagnosis not present

## 2013-12-09 DIAGNOSIS — H04129 Dry eye syndrome of unspecified lacrimal gland: Secondary | ICD-10-CM | POA: Diagnosis not present

## 2013-12-09 DIAGNOSIS — G510 Bell's palsy: Secondary | ICD-10-CM | POA: Diagnosis not present

## 2013-12-09 DIAGNOSIS — H52229 Regular astigmatism, unspecified eye: Secondary | ICD-10-CM | POA: Diagnosis not present

## 2013-12-09 LAB — MDC_IDC_ENUM_SESS_TYPE_REMOTE

## 2013-12-13 ENCOUNTER — Ambulatory Visit: Payer: Medicare Other | Admitting: Occupational Therapy

## 2013-12-13 ENCOUNTER — Ambulatory Visit: Payer: Medicare Other | Admitting: Physical Therapy

## 2013-12-13 DIAGNOSIS — Z5189 Encounter for other specified aftercare: Secondary | ICD-10-CM | POA: Diagnosis not present

## 2013-12-15 DIAGNOSIS — F4321 Adjustment disorder with depressed mood: Secondary | ICD-10-CM | POA: Diagnosis not present

## 2013-12-15 NOTE — Progress Notes (Signed)
Loop recorder 

## 2013-12-16 ENCOUNTER — Ambulatory Visit: Payer: Medicare Other | Admitting: Occupational Therapy

## 2013-12-16 ENCOUNTER — Ambulatory Visit: Payer: Medicare Other

## 2013-12-16 DIAGNOSIS — Z5189 Encounter for other specified aftercare: Secondary | ICD-10-CM | POA: Diagnosis not present

## 2013-12-19 ENCOUNTER — Ambulatory Visit: Payer: Medicare Other | Admitting: Occupational Therapy

## 2013-12-19 ENCOUNTER — Ambulatory Visit: Payer: Medicare Other | Admitting: Physical Therapy

## 2013-12-19 DIAGNOSIS — Z5189 Encounter for other specified aftercare: Secondary | ICD-10-CM | POA: Diagnosis not present

## 2013-12-20 ENCOUNTER — Encounter: Payer: Medicare Other | Admitting: Occupational Therapy

## 2013-12-20 ENCOUNTER — Ambulatory Visit: Payer: Medicare Other

## 2013-12-20 ENCOUNTER — Ambulatory Visit (INDEPENDENT_AMBULATORY_CARE_PROVIDER_SITE_OTHER): Payer: Medicare Other | Admitting: Neurology

## 2013-12-20 ENCOUNTER — Encounter: Payer: Self-pay | Admitting: Neurology

## 2013-12-20 VITALS — BP 166/74 | HR 64 | Ht 75.0 in | Wt 238.6 lb

## 2013-12-20 DIAGNOSIS — I6340 Cerebral infarction due to embolism of unspecified cerebral artery: Secondary | ICD-10-CM

## 2013-12-20 DIAGNOSIS — I6350 Cerebral infarction due to unspecified occlusion or stenosis of unspecified cerebral artery: Secondary | ICD-10-CM | POA: Diagnosis not present

## 2013-12-20 DIAGNOSIS — I639 Cerebral infarction, unspecified: Secondary | ICD-10-CM

## 2013-12-20 NOTE — Progress Notes (Signed)
PATIENT: Donald Donald [name] DOB: January 13, 1946  REASON FOR VISIT: hospital follow up for stroke HISTORY FROM: patient  HISTORY OF PRESENT ILLNESS: Donald Donald [name] is an 68 y.o. male who was visiting here for the holidays on 06/03/2013.  When he got up he was normal. As he was returning to bed began to be short of breath. Patient then was noted to have slurred speech by his wife and she noted a facial droop. Patient seemed to be weak on the left a well. EMS was called and the patient was brought in as a code stroke. Initial NIHSS of 8. Patient was given TPA. No intervenable lesion was seen on CTA. He was admitted to the neuro ICU for further evaluation and treatment.  CT angio head showed Subtle attenuation of the distal right superior MCA territory branch vessels as compared to the left. No proximal branch occlusion or high-grade flow-limiting stenosis identified within the right middle cerebral artery proximally.  Otherwise unremarkable CTA of the head without evidence of occlusion, high-grade stenosis, or aneurysm elsewhere within the brain.  CT angio neck was a normal CTA of the neck without evidence of high-grade stenosis, occlusion, or dissection. 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  MRI of the brain showed an acute Donald [name] right middle cerebral artery territory infarct. Minimal underlying petechial hemorrhage without lobar hematoma. 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 Minimal additional white matter changes suggest chronic small vessel ischemic disease. Acute on chronic paranasal sinusitis. 2D Echocardiogram with the estimated ejection fraction was in the range of 60% to 65%. He was admitted to [facility] 06/07/2013 through 07/02/2013. He is completed home physical therapy and occupational therapy and is about to start going to outpatient neuro rehabilitation.  He is making good progress with his rehabilitation and is very  determined.  He and his wife have moved in with her daughter here in [place]. He has established care with Dr. Orland Mustard and is seeing him every 3 weeks for adjustments to his hypertensive medications, as his hypertension has been very difficult to control since his 50s.  He thinks he forgot to take his blood pressure medicine the day he had the stroke.  His blood pressure is elevated office today at 161/90, he is tolerating Plavix well without any significant bruising or bleeding. He is able to ambulate short distances with a hemiwalker and AFO brace on the left. He is having some spasticity in the left arm with clonus at times. Update 12/20/2013 : He returns for followup after last visit 3 months ago. He states is doing well and has not had recurrent TIA her stroke symptoms. He states his worked hard on his diet and lifestyle changes and has in fact lost weight his hemoglobin A1c had dropped less than 5 hence glyburide was discontinued but the latest one was up to 6.2 metformin dose has been increased. He has lost 13 pounds weight since her last visit. He states his blood pressure usually once in the 130s at home though it was elevated on today's visit at 166/74 but it did come down to 148/8 for after resting.  He continues to walk with a foot brace but feels now he can walk further since his has lost weight and is feeling better REVIEW OF SYSTEMS: Full 14 system review of systems performed and notable only for:  Dry eyes, leg swelling, cold intolerance, flushing, diarrhea, frequency of urination, and daytime sleepiness and also the systems negative  ALLERGIES:  No Known Allergies  HOME MEDICATIONS: Outpatient Prescriptions Prior to Visit  Medication Sig Dispense Refill  . amLODipine (NORVASC) 10 MG tablet Take 1 tablet (10 mg total) by mouth daily.  30 tablet  1  . carvedilol (COREG) 25 MG tablet Take 1 tablet (25 mg total) by mouth 2 (two) times daily with a meal.  60 tablet  1  . cloNIDine (CATAPRES)  0.3 MG tablet Take 0.3 mg by mouth 2 (two) times daily.      . clopidogrel (PLAVIX) 75 MG tablet Take 1 tablet (75 mg total) by mouth daily with breakfast.  30 tablet  1  . hydrALAZINE (APRESOLINE) 10 MG tablet Take 10 mg by mouth 3 (three) times daily.       . hydrochlorothiazide (MICROZIDE) 12.5 MG capsule Take 25 mg by mouth daily.       Marland Kitchen lisinopril (PRINIVIL,ZESTRIL) 40 MG tablet Take 1 tablet (40 mg total) by mouth daily.  30 tablet  1  . glyBURIDE (DIABETA) 5 MG tablet Take 2.5 mg by mouth daily with breakfast.      . metFORMIN (GLUCOPHAGE-XR) 750 MG 24 hr tablet Take 1 mg by mouth daily with breakfast.        No facility-administered medications prior to visit.   PHYSICAL EXAM  Filed Vitals:   12/20/13 1105  BP: 166/74  Pulse: 64  Height: 6\' 3"  (1.905 m)  Weight: 108.228 kg (238 lb 9.6 oz)   Body mass index is 29.82 kg/(m^2).  Generalized: Mildly obese middle-age Caucasian male in no acute distress  Head: normocephalic and atraumatic. Oropharynx benign  Neck: Supple, no carotid bruits  Cardiac: Regular rate rhythm, no murmur  Musculoskeletal: No deformity   Neurological examination  Mentation: Alert oriented to time, place, history taking. Follows all commands speech and language fluent. Cranial nerve II-XII: Fundoscopic exam not done.  Pupils were equal round reactive to light extraocular movements were full, visual field were full on confrontational test. Facial sensation is normal bilaterally. There is mild left lower facial weakness. Hearing was intact to finger rubbing bilaterally. Uvula tongue midline. Head turning and shoulder shrug are decreased on the left. Motor: The motor testing reveals 5 over 5 strength in the right upper and right lower extremity. 3/5 strength in the left upper and lower extremity. Left hand grip weak. Increased on his left leg. Left foot drop. Wearing left ankle brace. Sensory: Sensory testing is decreased on the left to pinprick, soft touch,  vibration sensation, and position sense.  Evidence of extinction is noted on the left.  Coordination: Cerebellar testing reveals ataxia on the left, normal on the right.  Gait and station: Spastic ataxic gait with dragging of the left leg with left footdrop and ankle brace  Reflexes: Deep tendon reflexes are increased on the left.  NIHSS: 6 MRS: 3   DIAGNOSTIC DATA (LABS, IMAGING, TESTING) - I reviewed patient records, labs, notes, testing and imaging myself where available.  Lab Results  Component Value Date   HGBA1C 6.2* 06/03/2013    ASSESSMENT AND PLAN 68 y.o. year old male  has a past medical history of Hypertension; Diabetes mellitus without complication; Hyperlipemia; and Donald [name] right middle cerebral artery infarct, embolic (08/04/3297) here with for stroke follow up.  He had a loop recorder implanted on 06/07/13, which has not shown atrial fibrillation yet.  He has residual left spastic hemiplegia.  PLAN: Continue clopidogrel 75 mg orally every day  for secondary stroke prevention and maintain strict control of hypertension with  blood pressure goal below 130/90, diabetes with hemoglobin A1c goal below 6.5% and lipids with LDL cholesterol goal below 100 mg/dL. check carotid ultrasound and transcranial Doppler study Followup in the future with Charlott Holler, [facility] in 6 months.  Antony Contras, MD  12/20/2013, 9:50 PM [facility] [address], [place], [place] [address] [phone]  Note: This document was prepared with digital dictation and possible smart phrase technology. Any transcriptional errors that result from this process are unintentional.

## 2013-12-20 NOTE — Patient Instructions (Signed)
I had a long discussion the patient and his wife regarding his second stroke prevention strategies and answered questions. Check followup carotid ultrasound and transcranial Doppler studies. Continue Plavix for second stroke prevention with strict control of hypertension with blood pressure goal below 130/90, lipids with LDL cholesterol goal below 70 mg percent and diabetes with hemoglobin A1c goal below 6.5%. Continue lifestyle changes, diet  and exercise.  Stroke Prevention Some medical conditions and behaviors are associated with an increased chance of having a stroke. You may prevent a stroke by making healthy choices and managing medical conditions. HOW CAN I REDUCE MY RISK OF HAVING A STROKE?   Stay physically active. Get at least 30 minutes of activity on most or all days.  Do not smoke. It may also be helpful to avoid exposure to secondhand smoke.  Limit alcohol use. Moderate alcohol use is considered to be:  No more than 2 drinks per day for men.  No more than 1 drink per day for nonpregnant women.  Eat healthy foods. This involves  Eating 5 or more servings of fruits and vegetables a day.  Following a diet that addresses high blood pressure (hypertension), high cholesterol, diabetes, or obesity.  Manage your cholesterol levels.  A diet low in saturated fat, trans fat, and cholesterol and high in fiber may control cholesterol levels.  Take any prescribed medicines to control cholesterol as directed by your health care provider.  Manage your diabetes.  A controlled-carbohydrate, controlled-sugar diet is recommended to manage diabetes.  Take any prescribed medicines to control diabetes as directed by your health care provider.  Control your hypertension.  A low-salt (sodium), low-saturated fat, low-trans fat, and low-cholesterol diet is recommended to manage hypertension.  Take any prescribed medicines to control hypertension as directed by your health care  provider.  Maintain a healthy weight.  A reduced-calorie, low-sodium, low-saturated fat, low-trans fat, low-cholesterol diet is recommended to manage weight.  Stop drug abuse.  Avoid taking birth control pills.  Talk to your health care provider about the risks of taking birth control pills if you are over 15 years old, smoke, get migraines, or have ever had a blood clot.  Get evaluated for sleep disorders (sleep apnea).  Talk to your health care provider about getting a sleep evaluation if you snore a lot or have excessive sleepiness.  Take medicines as directed by your health care provider.  For some people, aspirin or blood thinners (anticoagulants) are helpful in reducing the risk of forming abnormal blood clots that can lead to stroke. If you have the irregular heart rhythm of atrial fibrillation, you should be on a blood thinner unless there is a good reason you cannot take them.  Understand all your medicine instructions.  Make sure that other other conditions (such as anemia or atherosclerosis) are addressed. SEEK IMMEDIATE MEDICAL CARE IF:   You have sudden weakness or numbness of the face, arm, or leg, especially on one side of the body.  Your face or eyelid droops to one side.  You have sudden confusion.  You have trouble speaking (aphasia) or understanding.  You have sudden trouble seeing in one or both eyes.  You have sudden trouble walking.  You have dizziness.  You have a loss of balance or coordination.  You have a sudden, severe headache with no known cause.  You have new chest pain or an irregular heartbeat. Any of these symptoms may represent a serious problem that is an emergency. Do not wait to see  if the symptoms will go away. Get medical help at once. Call your local emergency services  (911 in [place] Do not drive yourself to the hospital. Document Released: 06/26/2004 Document Revised: 03/09/2013 Document Reviewed: 11/19/2012 [facility] Patient  Information 2015 [place], [place]. This information is not intended to replace advice given to you by your health care provider. Make sure you discuss any questions you have with your health care provider.

## 2013-12-22 ENCOUNTER — Ambulatory Visit (HOSPITAL_BASED_OUTPATIENT_CLINIC_OR_DEPARTMENT_OTHER): Payer: Medicare Other | Admitting: Physical Medicine & Rehabilitation

## 2013-12-22 ENCOUNTER — Encounter: Payer: Medicare Other | Attending: Physical Medicine & Rehabilitation

## 2013-12-22 ENCOUNTER — Encounter: Payer: Self-pay | Admitting: Physical Medicine & Rehabilitation

## 2013-12-22 VITALS — BP 161/71 | HR 70 | Resp 14

## 2013-12-22 DIAGNOSIS — I69998 Other sequelae following unspecified cerebrovascular disease: Secondary | ICD-10-CM | POA: Diagnosis not present

## 2013-12-22 DIAGNOSIS — I69959 Hemiplegia and hemiparesis following unspecified cerebrovascular disease affecting unspecified side: Secondary | ICD-10-CM | POA: Insufficient documentation

## 2013-12-22 DIAGNOSIS — G8110 Spastic hemiplegia affecting unspecified side: Secondary | ICD-10-CM

## 2013-12-22 DIAGNOSIS — R209 Unspecified disturbances of skin sensation: Secondary | ICD-10-CM

## 2013-12-22 DIAGNOSIS — H539 Unspecified visual disturbance: Secondary | ICD-10-CM | POA: Diagnosis not present

## 2013-12-22 DIAGNOSIS — I6340 Cerebral infarction due to embolism of unspecified cerebral artery: Secondary | ICD-10-CM

## 2013-12-22 DIAGNOSIS — M264 Malocclusion, unspecified: Secondary | ICD-10-CM

## 2013-12-22 DIAGNOSIS — I10 Essential (primary) hypertension: Secondary | ICD-10-CM | POA: Diagnosis not present

## 2013-12-22 NOTE — Patient Instructions (Addendum)
Diabetic neuropathy  No driving, left neglect

## 2013-12-22 NOTE — Progress Notes (Signed)
Subjective:    Patient ID: Donald [name], male    DOB: 1945-06-11, 68 y.o.   MRN: [id number]  HPI outpt OT ongoing outpt PT ongoing Pt is concerned about stopping this next month  40lb wt loss Has followed up with neuro ophthalmologist Has followed up with primary care physician  Patient also complains that his bite does not feel the same and has been shifting some of his caps because of this. Continues to have left central 7 Pain Inventory Average Pain 0 Pain Right Now 0 My pain is no pain  In the last 24 hours, has pain interfered with the following? General activity 0 Relation with others 0 Enjoyment of life 0 What TIME of day is your pain at its worst? no pain Sleep (in general) Good  Pain is worse with: na Pain improves with: na Relief from Meds: na  Mobility walk with assistance use a cane ability to climb steps?  yes transfers alone  Function employed # of hrs/week 10+ retired I need assistance with the following:  bathing Do you have any goals in this area?  yes  Neuro/Psych weakness trouble walking  Prior Studies Any changes since last visit?  no  Physicians involved in your care Any changes since last visit?  no   Family History  Problem Relation Age of Onset  . Dementia Mother   . Diabetes Mother   . Heart attack Father    History   Social History  . Marital Status: Married    Spouse Name: [name]    Number of Children: 2  . Years of Education: college   Occupational History  . retired    Social History Main Topics  . Smoking status: Former Research scientist (life sciences)  . Smokeless tobacco: Never Used     Comment: Quit 25 years ago.  . Alcohol Use: 0.6 oz/week    1 Glasses of wine per week     Comment: Rare  . Drug Use: No  . Sexual Activity: No   Other Topics Concern  . None   Social History Narrative   Patient lives at home [place]).   Retired works part time 10 hours a week.   Right handed   Education college   Caffeine one cup  daily coffee   Past Surgical History  Procedure Laterality Date  . Tee without cardioversion N/A 06/07/2013    Procedure: TRANSESOPHAGEAL ECHOCARDIOGRAM (TEE);  Surgeon: [name], MD;  Location: [facility] ENDOSCOPY;  Service: Cardiovascular;  Laterality: N/A;  . 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 Hypertension   . Diabetes mellitus without complication   . Hyperlipemia   . large right middle cerebral artery infarct, embolic 12/05/6431    a. s/p IV tPA, b. source unknown, c. loop recorder placed   BP 161/71  Pulse 70  Resp 14  SpO2 97%  Opioid Risk Score:   Fall Risk Score: Moderate Fall Risk (6-13 points) (pt educated on fall risk, brochure given to pt previously)    Review of Systems  Constitutional: Positive for unexpected weight change.  Endocrine:       High blood sugar  Musculoskeletal: Positive for gait problem.  Neurological: Positive for weakness.  All other systems reviewed and are negative.      Objective:   Physical Exam  Nursing note and vitals reviewed. Constitutional: He appears well-developed and well-nourished.  HENT:  Head:  Normocephalic and atraumatic.  Eyes: Conjunctivae are normal. Pupils are equal, round, and reactive to light.    No shoulder pain with ROM full ROM No Clonus at fingers , MAS1 at finger and wrist flexors  Motor strength 4-deltoids biceps triceps grip  4 + left hip flexion 4+ left knee extension, 3 minus ankle dorsiflexion and plantar flexion, 3 minus toe flexion-extension  no edema left dorsal hand and wrist  Right-sided strength normal  Thumb opposition can oppose little finger on the left  Mood and affect are appropriate  Speech without evidence of dysarthria  Sensation diminshed Sharp/dull left hand as well as bilateral feet in a stocking distribution  Gait with cane, increased finger and wrist Left central 7 Teeth demonstrate  some tipping at the superior aspects of the incisors upper and lower    Assessment & Plan:  1. R MCA infarct Left HP and Hemisensory deficits, continue outpatient PT and OT. Discussed multiple issues with patient and his wife. Overall has been making improvements with left upper extremity sensation as well as left upper extremity and left lower extremity strength  No need for Botox or spasticity medication at the current time , increasing elbow flex her tone as well as finger flexor tone on the left side during ambulation. Advise stretching and concentration for a more natural arm swing during ambulation. 2. HTN per PCP  3. Left ulnar neuropathy, resolved  4. Left wrist contracture, stretching exercises demonstrated 5. Neuro optho eval- no visual field deficits on bedside exam will have formal testing next month.  Do not advise driving secondary to residual left neglect 6. Probable diabetic neuropathy. Patient does note that his hemoglobin A1c has never been much above 6. Nevertheless he does have bilateral lower extremity stocking distribution numbness to pinprick 7. 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 Referral to oral surgeon to further evaluate and treat  Over half of the 25 min visit was spent counseling and coordinating care.

## 2013-12-23 ENCOUNTER — Ambulatory Visit: Payer: Medicare Other | Admitting: Physical Therapy

## 2013-12-23 ENCOUNTER — Ambulatory Visit: Payer: Medicare Other | Admitting: Occupational Therapy

## 2013-12-23 DIAGNOSIS — Z5189 Encounter for other specified aftercare: Secondary | ICD-10-CM | POA: Diagnosis not present

## 2013-12-26 ENCOUNTER — Ambulatory Visit: Payer: Medicare Other

## 2013-12-26 ENCOUNTER — Encounter: Payer: Medicare Other | Admitting: Occupational Therapy

## 2013-12-27 ENCOUNTER — Ambulatory Visit: Payer: Medicare Other | Admitting: Occupational Therapy

## 2013-12-27 ENCOUNTER — Ambulatory Visit: Payer: Medicare Other | Admitting: Physical Therapy

## 2013-12-27 DIAGNOSIS — F4321 Adjustment disorder with depressed mood: Secondary | ICD-10-CM | POA: Diagnosis not present

## 2013-12-27 DIAGNOSIS — Z5189 Encounter for other specified aftercare: Secondary | ICD-10-CM | POA: Diagnosis not present

## 2013-12-30 ENCOUNTER — Ambulatory Visit: Payer: Medicare Other | Admitting: Occupational Therapy

## 2013-12-30 ENCOUNTER — Encounter: Payer: Self-pay | Admitting: Nurse Practitioner

## 2013-12-30 ENCOUNTER — Ambulatory Visit: Payer: Medicare Other

## 2013-12-30 DIAGNOSIS — Z5189 Encounter for other specified aftercare: Secondary | ICD-10-CM | POA: Diagnosis not present

## 2014-01-03 ENCOUNTER — Ambulatory Visit: Payer: Medicare Other | Attending: Physical Medicine & Rehabilitation | Admitting: Physical Therapy

## 2014-01-03 ENCOUNTER — Ambulatory Visit: Payer: Medicare Other | Admitting: Occupational Therapy

## 2014-01-03 DIAGNOSIS — R293 Abnormal posture: Secondary | ICD-10-CM | POA: Insufficient documentation

## 2014-01-03 DIAGNOSIS — Z5189 Encounter for other specified aftercare: Secondary | ICD-10-CM | POA: Diagnosis not present

## 2014-01-03 DIAGNOSIS — R262 Difficulty in walking, not elsewhere classified: Secondary | ICD-10-CM | POA: Insufficient documentation

## 2014-01-03 DIAGNOSIS — R279 Unspecified lack of coordination: Secondary | ICD-10-CM | POA: Insufficient documentation

## 2014-01-06 ENCOUNTER — Ambulatory Visit: Payer: Medicare Other | Admitting: Physical Therapy

## 2014-01-06 ENCOUNTER — Ambulatory Visit: Payer: Medicare Other | Admitting: Occupational Therapy

## 2014-01-09 ENCOUNTER — Ambulatory Visit: Payer: Medicare Other

## 2014-01-09 ENCOUNTER — Ambulatory Visit (INDEPENDENT_AMBULATORY_CARE_PROVIDER_SITE_OTHER): Payer: Medicare Other | Admitting: *Deleted

## 2014-01-09 ENCOUNTER — Ambulatory Visit: Payer: Medicare Other | Admitting: Occupational Therapy

## 2014-01-09 DIAGNOSIS — I6350 Cerebral infarction due to unspecified occlusion or stenosis of unspecified cerebral artery: Secondary | ICD-10-CM

## 2014-01-09 DIAGNOSIS — Z5189 Encounter for other specified aftercare: Secondary | ICD-10-CM | POA: Diagnosis not present

## 2014-01-09 DIAGNOSIS — I639 Cerebral infarction, unspecified: Secondary | ICD-10-CM

## 2014-01-09 LAB — MDC_IDC_ENUM_SESS_TYPE_REMOTE

## 2014-01-11 ENCOUNTER — Ambulatory Visit (INDEPENDENT_AMBULATORY_CARE_PROVIDER_SITE_OTHER): Payer: Medicare Other

## 2014-01-11 DIAGNOSIS — Z0289 Encounter for other administrative examinations: Secondary | ICD-10-CM

## 2014-01-11 DIAGNOSIS — I639 Cerebral infarction, unspecified: Secondary | ICD-10-CM

## 2014-01-11 DIAGNOSIS — I6350 Cerebral infarction due to unspecified occlusion or stenosis of unspecified cerebral artery: Secondary | ICD-10-CM | POA: Diagnosis not present

## 2014-01-12 NOTE — Progress Notes (Signed)
Loop recorder 

## 2014-01-13 ENCOUNTER — Ambulatory Visit: Payer: Medicare Other | Admitting: Occupational Therapy

## 2014-01-13 ENCOUNTER — Ambulatory Visit: Payer: Medicare Other | Admitting: *Deleted

## 2014-01-13 DIAGNOSIS — Z5189 Encounter for other specified aftercare: Secondary | ICD-10-CM | POA: Diagnosis not present

## 2014-01-18 ENCOUNTER — Ambulatory Visit: Payer: Medicare Other | Admitting: Physical Therapy

## 2014-01-18 ENCOUNTER — Encounter: Payer: Medicare Other | Admitting: Occupational Therapy

## 2014-01-20 ENCOUNTER — Ambulatory Visit: Payer: Medicare Other | Admitting: Physical Therapy

## 2014-01-23 ENCOUNTER — Ambulatory Visit: Payer: Medicare Other | Admitting: Physical Therapy

## 2014-01-23 ENCOUNTER — Encounter: Payer: Medicare Other | Admitting: Occupational Therapy

## 2014-01-23 DIAGNOSIS — J40 Bronchitis, not specified as acute or chronic: Secondary | ICD-10-CM | POA: Diagnosis not present

## 2014-01-23 DIAGNOSIS — I10 Essential (primary) hypertension: Secondary | ICD-10-CM | POA: Diagnosis not present

## 2014-01-24 ENCOUNTER — Encounter: Payer: Self-pay | Admitting: *Deleted

## 2014-01-25 ENCOUNTER — Encounter: Payer: Medicare Other | Admitting: Occupational Therapy

## 2014-01-25 ENCOUNTER — Ambulatory Visit: Payer: Medicare Other | Admitting: Physical Therapy

## 2014-01-26 ENCOUNTER — Encounter: Payer: Medicare Other | Admitting: Occupational Therapy

## 2014-01-26 ENCOUNTER — Ambulatory Visit: Payer: Medicare Other

## 2014-01-30 ENCOUNTER — Ambulatory Visit: Payer: Medicare Other | Admitting: Physical Therapy

## 2014-01-30 ENCOUNTER — Ambulatory Visit: Payer: Medicare Other | Admitting: Occupational Therapy

## 2014-01-30 DIAGNOSIS — Z5189 Encounter for other specified aftercare: Secondary | ICD-10-CM | POA: Diagnosis not present

## 2014-01-30 DIAGNOSIS — I129 Hypertensive chronic kidney disease with stage 1 through stage 4 chronic kidney disease, or unspecified chronic kidney disease: Secondary | ICD-10-CM | POA: Diagnosis not present

## 2014-02-02 ENCOUNTER — Telehealth: Payer: Self-pay | Admitting: *Deleted

## 2014-02-02 ENCOUNTER — Ambulatory Visit: Payer: Medicare Other | Attending: Physical Medicine & Rehabilitation | Admitting: Physical Therapy

## 2014-02-02 ENCOUNTER — Ambulatory Visit: Payer: Medicare Other | Admitting: Occupational Therapy

## 2014-02-02 DIAGNOSIS — Z5189 Encounter for other specified aftercare: Secondary | ICD-10-CM | POA: Insufficient documentation

## 2014-02-02 DIAGNOSIS — R279 Unspecified lack of coordination: Secondary | ICD-10-CM | POA: Insufficient documentation

## 2014-02-02 DIAGNOSIS — R293 Abnormal posture: Secondary | ICD-10-CM | POA: Insufficient documentation

## 2014-02-02 DIAGNOSIS — R262 Difficulty in walking, not elsewhere classified: Secondary | ICD-10-CM | POA: Diagnosis not present

## 2014-02-03 NOTE — Telephone Encounter (Signed)
Called patient lvm letting him know I will  have Dr. Leonie Man read the results and i will call hm back.

## 2014-02-07 ENCOUNTER — Telehealth: Payer: Self-pay | Admitting: Neurology

## 2014-02-07 ENCOUNTER — Ambulatory Visit: Payer: Medicare Other | Admitting: Occupational Therapy

## 2014-02-07 ENCOUNTER — Ambulatory Visit: Payer: Medicare Other

## 2014-02-07 DIAGNOSIS — Z5189 Encounter for other specified aftercare: Secondary | ICD-10-CM | POA: Diagnosis not present

## 2014-02-07 DIAGNOSIS — R279 Unspecified lack of coordination: Secondary | ICD-10-CM | POA: Diagnosis not present

## 2014-02-07 DIAGNOSIS — R262 Difficulty in walking, not elsewhere classified: Secondary | ICD-10-CM | POA: Diagnosis not present

## 2014-02-07 DIAGNOSIS — R293 Abnormal posture: Secondary | ICD-10-CM | POA: Diagnosis not present

## 2014-02-07 NOTE — Telephone Encounter (Signed)
Patient's wife calling stating that they have not heard back on results of recent testing and are anxious to hear. Best number to reach patient is  [phone].

## 2014-02-07 NOTE — Telephone Encounter (Signed)
Called patient and lvm letting them know Dr. Leonie Man is out the office but will return next and if he stops by the office i will have him read them for me.

## 2014-02-08 ENCOUNTER — Ambulatory Visit (INDEPENDENT_AMBULATORY_CARE_PROVIDER_SITE_OTHER): Payer: Medicare Other | Admitting: *Deleted

## 2014-02-08 DIAGNOSIS — I6350 Cerebral infarction due to unspecified occlusion or stenosis of unspecified cerebral artery: Secondary | ICD-10-CM | POA: Diagnosis not present

## 2014-02-08 DIAGNOSIS — I639 Cerebral infarction, unspecified: Secondary | ICD-10-CM

## 2014-02-08 LAB — MDC_IDC_ENUM_SESS_TYPE_REMOTE: Date Time Interrogation Session: 20150510040500

## 2014-02-09 ENCOUNTER — Ambulatory Visit: Payer: Medicare Other

## 2014-02-09 ENCOUNTER — Ambulatory Visit: Payer: Medicare Other | Admitting: Occupational Therapy

## 2014-02-09 DIAGNOSIS — R293 Abnormal posture: Secondary | ICD-10-CM | POA: Diagnosis not present

## 2014-02-09 DIAGNOSIS — R262 Difficulty in walking, not elsewhere classified: Secondary | ICD-10-CM | POA: Diagnosis not present

## 2014-02-09 DIAGNOSIS — Z5189 Encounter for other specified aftercare: Secondary | ICD-10-CM | POA: Diagnosis not present

## 2014-02-09 DIAGNOSIS — R279 Unspecified lack of coordination: Secondary | ICD-10-CM | POA: Diagnosis not present

## 2014-02-10 DIAGNOSIS — I6340 Cerebral infarction due to embolism of unspecified cerebral artery: Secondary | ICD-10-CM | POA: Diagnosis not present

## 2014-02-10 NOTE — Progress Notes (Signed)
Loop recorder 

## 2014-02-13 ENCOUNTER — Ambulatory Visit: Payer: Medicare Other

## 2014-02-13 ENCOUNTER — Encounter: Payer: Medicare Other | Admitting: Occupational Therapy

## 2014-02-14 ENCOUNTER — Ambulatory Visit: Payer: Medicare Other | Admitting: Physical Therapy

## 2014-02-14 ENCOUNTER — Ambulatory Visit: Payer: Medicare Other | Admitting: Occupational Therapy

## 2014-02-14 ENCOUNTER — Encounter: Payer: Self-pay | Admitting: Internal Medicine

## 2014-02-14 DIAGNOSIS — R279 Unspecified lack of coordination: Secondary | ICD-10-CM | POA: Diagnosis not present

## 2014-02-14 DIAGNOSIS — Z5189 Encounter for other specified aftercare: Secondary | ICD-10-CM | POA: Diagnosis not present

## 2014-02-14 DIAGNOSIS — R262 Difficulty in walking, not elsewhere classified: Secondary | ICD-10-CM | POA: Diagnosis not present

## 2014-02-14 DIAGNOSIS — R293 Abnormal posture: Secondary | ICD-10-CM | POA: Diagnosis not present

## 2014-02-16 ENCOUNTER — Ambulatory Visit: Payer: Medicare Other | Admitting: Occupational Therapy

## 2014-02-16 ENCOUNTER — Ambulatory Visit: Payer: Medicare Other

## 2014-02-16 DIAGNOSIS — R279 Unspecified lack of coordination: Secondary | ICD-10-CM | POA: Diagnosis not present

## 2014-02-16 DIAGNOSIS — R293 Abnormal posture: Secondary | ICD-10-CM | POA: Diagnosis not present

## 2014-02-16 DIAGNOSIS — R262 Difficulty in walking, not elsewhere classified: Secondary | ICD-10-CM | POA: Diagnosis not present

## 2014-02-16 DIAGNOSIS — Z5189 Encounter for other specified aftercare: Secondary | ICD-10-CM | POA: Diagnosis not present

## 2014-02-20 ENCOUNTER — Ambulatory Visit: Payer: Medicare Other | Admitting: Occupational Therapy

## 2014-02-20 ENCOUNTER — Ambulatory Visit (HOSPITAL_BASED_OUTPATIENT_CLINIC_OR_DEPARTMENT_OTHER): Payer: Medicare Other | Admitting: Physical Medicine & Rehabilitation

## 2014-02-20 ENCOUNTER — Encounter: Payer: Self-pay | Admitting: Physical Medicine & Rehabilitation

## 2014-02-20 ENCOUNTER — Encounter: Payer: Medicare Other | Attending: Physical Medicine & Rehabilitation

## 2014-02-20 ENCOUNTER — Ambulatory Visit: Payer: Medicare Other

## 2014-02-20 VITALS — BP 129/76 | HR 65 | Resp 14 | Wt 224.0 lb

## 2014-02-20 DIAGNOSIS — R279 Unspecified lack of coordination: Secondary | ICD-10-CM | POA: Diagnosis not present

## 2014-02-20 DIAGNOSIS — R293 Abnormal posture: Secondary | ICD-10-CM | POA: Diagnosis not present

## 2014-02-20 DIAGNOSIS — Z5189 Encounter for other specified aftercare: Secondary | ICD-10-CM | POA: Diagnosis not present

## 2014-02-20 DIAGNOSIS — G8110 Spastic hemiplegia affecting unspecified side: Secondary | ICD-10-CM | POA: Diagnosis not present

## 2014-02-20 DIAGNOSIS — I69998 Other sequelae following unspecified cerebrovascular disease: Secondary | ICD-10-CM | POA: Diagnosis not present

## 2014-02-20 DIAGNOSIS — I69959 Hemiplegia and hemiparesis following unspecified cerebrovascular disease affecting unspecified side: Secondary | ICD-10-CM | POA: Diagnosis not present

## 2014-02-20 DIAGNOSIS — I10 Essential (primary) hypertension: Secondary | ICD-10-CM | POA: Diagnosis not present

## 2014-02-20 DIAGNOSIS — R262 Difficulty in walking, not elsewhere classified: Secondary | ICD-10-CM | POA: Diagnosis not present

## 2014-02-20 DIAGNOSIS — I6340 Cerebral infarction due to embolism of unspecified cerebral artery: Secondary | ICD-10-CM

## 2014-02-20 DIAGNOSIS — R209 Unspecified disturbances of skin sensation: Secondary | ICD-10-CM

## 2014-02-20 LAB — MDC_IDC_ENUM_SESS_TYPE_REMOTE

## 2014-02-20 NOTE — Patient Instructions (Signed)
Follow up with Dr Frederico Hamman- for results of visual field testing

## 2014-02-20 NOTE — Progress Notes (Signed)
Subjective:    Patient ID: Donald [name], male    DOB: [birth year], 68 y.o.   MRN: [id number]  HPI Off AFO, Still has PT/OT, using cane to ambulate. Has been out in the community going to movies as well as out to dinner.  Visual field testing done results pending  Patient has had no falls. No new medical issues. Continues with significant weight loss.   Pain Inventory Average Pain 0 Pain Right Now 0 My pain is no pain  In the last 24 hours, has pain interfered with the following? General activity 0 Relation with others 0 Enjoyment of life 0 What TIME of day is your pain at its worst? no pain Sleep (in general) Good  Pain is worse with: no pain Pain improves with: no pain Relief from Meds: no pain  Mobility walk without assistance use a cane how many minutes can you walk? 20-30 ability to climb steps?  yes do you drive?  no  Function employed # of hrs/week 10-15 retired  Neuro/Psych No problems in this area  Prior Studies Any changes since last visit?  no  Physicians involved in your care Any changes since last visit?  no   Family History  Problem Relation Age of Onset  . Dementia Mother   . Diabetes Mother   . Heart attack Father    History   Social History  . Marital Status: Married    Spouse Name: [name]    Number of Children: 2  . Years of Education: college   Occupational History  . retired    Social History Main Topics  . Smoking status: Former Research scientist (life sciences)  . Smokeless tobacco: Never Used     Comment: Quit 25 years ago.  . Alcohol Use: 0.6 oz/week    1 Glasses of wine per week     Comment: Rare  . Drug Use: No  . Sexual Activity: No   Other Topics Concern  . None   Social History Narrative   Patient lives at home [place]).   Retired works part time 10 hours a week.   Right handed   Education college   Caffeine one cup daily coffee   Past Surgical History  Procedure Laterality Date  . Tee without cardioversion N/A 06/07/2013     Procedure: TRANSESOPHAGEAL ECHOCARDIOGRAM (TEE);  Surgeon: [name], MD;  Location: [facility] ENDOSCOPY;  Service: Cardiovascular;  Laterality: N/A;  . 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 Hypertension   . Diabetes mellitus without complication   . Hyperlipemia   . large right middle cerebral artery infarct, embolic 11/30/624    a. s/p IV tPA, b. source unknown, c. loop recorder placed   BP 129/76  Pulse 65  Resp 14  Wt 224 lb (101.606 kg)  SpO2 98%  Opioid Risk Score:   Fall Risk Score: Moderate Fall Risk (6-13 points) (previoulsy educated and given handout) Review of Systems  All other systems reviewed and are negative.      Objective:   Physical Exam  Nursing note and vitals reviewed.  Constitutional: He appears well-developed and well-nourished.  HENT:  Head: Normocephalic and atraumatic.  Eyes: Conjunctivae are normal. Pupils are equal, round, and reactive to light.   No shoulder pain with ROM full ROM  No Clonus at fingers , MAS1 at finger and wrist flexors  Motor strength 4-deltoids biceps triceps grip  4 + left hip flexion 4+ left knee extension, 3 minus ankle dorsiflexion and plantar flexion, 3 minus toe flexion-extension  no edema left dorsal hand and wrist  Right-sided strength normal    Thumb opposition can oppose little finger on the left  Mood and affect are appropriate  Speech without evidence of dysarthria  Sensation diminshed Sharp/dull left hand as well as bilateral feet in a stocking distribution  Gait with cane, increased finger and wrist  Left central 7       Assessment & Plan:  1. R MCA infarct Left HP and Hemisensory deficits, continue outpatient PT and OT. Discussed multiple issues with patient and his wife. Overall has been making improvements with left upper extremity sensation as well as left upper extremity and left lower extremity strength     2. HTN per PCP , with weight loss blood pressure control is improving and starting to reduce medications   3. Neuro optho eval- formal visual field testing performed but results not they'll. Do not advise driving unless cleared by by Neuro optho   4. Probable diabetic neuropathy. Patient does note that his hemoglobin A1c has never been much above 6. Nevertheless he does have bilateral lower extremity stocking distribution numbness to pinprick

## 2014-02-21 ENCOUNTER — Encounter: Payer: Self-pay | Admitting: Internal Medicine

## 2014-02-21 ENCOUNTER — Telehealth: Payer: Self-pay | Admitting: *Deleted

## 2014-02-21 NOTE — Telephone Encounter (Signed)
I called pt and LMVM for pt and wife about relaying results of doppler results.   They are to return call.  Carotid normal study, repeat in 12 months.   TCD - mild hardening of arteries in brain, no major blockages,  Repeat in 12 months.

## 2014-02-21 NOTE — Telephone Encounter (Signed)
Patient returning call to Sandy, please call and advise.  °

## 2014-02-21 NOTE — Telephone Encounter (Signed)
I called pt and he had already been given results of doppler studies by Dr. Leonie Man.

## 2014-02-22 ENCOUNTER — Encounter: Payer: Self-pay | Admitting: Internal Medicine

## 2014-02-23 ENCOUNTER — Ambulatory Visit: Payer: Medicare Other

## 2014-02-23 ENCOUNTER — Ambulatory Visit: Payer: Medicare Other | Admitting: Occupational Therapy

## 2014-02-23 DIAGNOSIS — R262 Difficulty in walking, not elsewhere classified: Secondary | ICD-10-CM | POA: Diagnosis not present

## 2014-02-23 DIAGNOSIS — R293 Abnormal posture: Secondary | ICD-10-CM | POA: Diagnosis not present

## 2014-02-23 DIAGNOSIS — R279 Unspecified lack of coordination: Secondary | ICD-10-CM | POA: Diagnosis not present

## 2014-02-23 DIAGNOSIS — Z5189 Encounter for other specified aftercare: Secondary | ICD-10-CM | POA: Diagnosis not present

## 2014-02-28 ENCOUNTER — Ambulatory Visit: Payer: Medicare Other | Admitting: Occupational Therapy

## 2014-02-28 ENCOUNTER — Ambulatory Visit: Payer: Medicare Other

## 2014-02-28 DIAGNOSIS — R262 Difficulty in walking, not elsewhere classified: Secondary | ICD-10-CM | POA: Diagnosis not present

## 2014-02-28 DIAGNOSIS — Z5189 Encounter for other specified aftercare: Secondary | ICD-10-CM | POA: Diagnosis not present

## 2014-02-28 DIAGNOSIS — R293 Abnormal posture: Secondary | ICD-10-CM | POA: Diagnosis not present

## 2014-02-28 DIAGNOSIS — R279 Unspecified lack of coordination: Secondary | ICD-10-CM | POA: Diagnosis not present

## 2014-03-02 ENCOUNTER — Ambulatory Visit: Payer: Medicare Other | Attending: Physical Medicine & Rehabilitation | Admitting: Occupational Therapy

## 2014-03-02 ENCOUNTER — Ambulatory Visit: Payer: Medicare Other

## 2014-03-02 DIAGNOSIS — R293 Abnormal posture: Secondary | ICD-10-CM | POA: Insufficient documentation

## 2014-03-02 DIAGNOSIS — I69354 Hemiplegia and hemiparesis following cerebral infarction affecting left non-dominant side: Secondary | ICD-10-CM | POA: Diagnosis not present

## 2014-03-02 DIAGNOSIS — R262 Difficulty in walking, not elsewhere classified: Secondary | ICD-10-CM | POA: Insufficient documentation

## 2014-03-02 DIAGNOSIS — R279 Unspecified lack of coordination: Secondary | ICD-10-CM | POA: Insufficient documentation

## 2014-03-03 ENCOUNTER — Encounter: Payer: Self-pay | Admitting: Internal Medicine

## 2014-03-07 ENCOUNTER — Ambulatory Visit: Payer: Medicare Other | Admitting: Physical Therapy

## 2014-03-07 ENCOUNTER — Encounter: Payer: Medicare Other | Admitting: Occupational Therapy

## 2014-03-09 DIAGNOSIS — I6350 Cerebral infarction due to unspecified occlusion or stenosis of unspecified cerebral artery: Secondary | ICD-10-CM | POA: Diagnosis not present

## 2014-03-09 DIAGNOSIS — E119 Type 2 diabetes mellitus without complications: Secondary | ICD-10-CM | POA: Diagnosis not present

## 2014-03-09 DIAGNOSIS — I10 Essential (primary) hypertension: Secondary | ICD-10-CM | POA: Diagnosis not present

## 2014-03-09 DIAGNOSIS — Z23 Encounter for immunization: Secondary | ICD-10-CM | POA: Diagnosis not present

## 2014-03-10 ENCOUNTER — Ambulatory Visit (INDEPENDENT_AMBULATORY_CARE_PROVIDER_SITE_OTHER): Payer: Medicare Other | Admitting: *Deleted

## 2014-03-10 DIAGNOSIS — I639 Cerebral infarction, unspecified: Secondary | ICD-10-CM

## 2014-03-10 LAB — MDC_IDC_ENUM_SESS_TYPE_REMOTE

## 2014-03-13 ENCOUNTER — Ambulatory Visit: Payer: Medicare Other | Admitting: Occupational Therapy

## 2014-03-13 ENCOUNTER — Ambulatory Visit: Payer: Medicare Other

## 2014-03-13 DIAGNOSIS — I69354 Hemiplegia and hemiparesis following cerebral infarction affecting left non-dominant side: Secondary | ICD-10-CM | POA: Diagnosis not present

## 2014-03-15 NOTE — Progress Notes (Signed)
Loop recorder 

## 2014-03-17 ENCOUNTER — Encounter: Payer: Medicare Other | Admitting: Occupational Therapy

## 2014-03-17 ENCOUNTER — Ambulatory Visit: Payer: Medicare Other

## 2014-03-21 ENCOUNTER — Encounter: Payer: Self-pay | Admitting: Internal Medicine

## 2014-03-21 ENCOUNTER — Ambulatory Visit: Payer: Medicare Other | Admitting: Occupational Therapy

## 2014-03-21 ENCOUNTER — Ambulatory Visit: Payer: Medicare Other

## 2014-03-21 DIAGNOSIS — I69354 Hemiplegia and hemiparesis following cerebral infarction affecting left non-dominant side: Secondary | ICD-10-CM | POA: Diagnosis not present

## 2014-03-27 ENCOUNTER — Ambulatory Visit: Payer: Medicare Other | Admitting: Physical Medicine & Rehabilitation

## 2014-03-27 ENCOUNTER — Encounter: Payer: Medicare Other | Attending: Physical Medicine & Rehabilitation

## 2014-03-28 ENCOUNTER — Ambulatory Visit: Payer: Medicare Other

## 2014-03-28 ENCOUNTER — Ambulatory Visit: Payer: Medicare Other | Admitting: Occupational Therapy

## 2014-03-28 DIAGNOSIS — I69354 Hemiplegia and hemiparesis following cerebral infarction affecting left non-dominant side: Secondary | ICD-10-CM | POA: Diagnosis not present

## 2014-04-04 ENCOUNTER — Ambulatory Visit: Payer: Medicare Other | Attending: Physical Medicine & Rehabilitation

## 2014-04-04 DIAGNOSIS — R279 Unspecified lack of coordination: Secondary | ICD-10-CM | POA: Diagnosis not present

## 2014-04-04 DIAGNOSIS — I6980 Unspecified sequelae of other cerebrovascular disease: Secondary | ICD-10-CM | POA: Insufficient documentation

## 2014-04-04 DIAGNOSIS — R293 Abnormal posture: Secondary | ICD-10-CM | POA: Diagnosis not present

## 2014-04-04 DIAGNOSIS — R262 Difficulty in walking, not elsewhere classified: Secondary | ICD-10-CM | POA: Diagnosis not present

## 2014-04-04 DIAGNOSIS — IMO0002 Reserved for concepts with insufficient information to code with codable children: Secondary | ICD-10-CM

## 2014-04-04 NOTE — Therapy (Signed)
Physical Therapy Treatment  Patient Details  Name: Donald [name] MRN: [id number] Date of Birth: 1945-11-07  Encounter Date: April 23, 2014      PT End of Session - 2014/04/23 1437    Visit Number 50   Number of Visits 50   PT Start Time 1330   PT Stop Time 1420   PT Time Calculation (min) 50 min      Past Medical History  Diagnosis Date  . Hypertension   . Diabetes mellitus without complication   . Hyperlipemia   . large right middle cerebral artery infarct, embolic 10/05/3873    a. s/p IV tPA, b. source unknown, c. loop recorder placed    Past Surgical History  Procedure Laterality Date  . Tee without cardioversion N/A 06/07/2013    Procedure: TRANSESOPHAGEAL ECHOCARDIOGRAM (TEE);  Surgeon: [name], MD;  Location: [facility] ENDOSCOPY;  Service: Cardiovascular;  Laterality: N/A;  . 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  Visit Diagnosis:  Difficulty walking  Lack of coordination due to stroke  Abnormal posture            Education - Apr 23, 2014 1437    Education provided Yes   Education Details updated home exercise program   Education Details Patient;Spouse   Methods Explanation;Demonstration;Handout   Comprehension Verbalized understanding;Returned demonstration     Neuro-re-ed today for entire session. Pt was taught and provided an updated home exercise program. A copy of the finalized exercise program will be scanned into the medical record.       PT Long Term Goals - 2014-04-23 1440    Title Verbalize plan for community fitness   Status Achieved   Title Ambulate on outdoor, unlevel concrete including curbs and ramps x1000' MOD I with can if needed   Status Not Met   Title Ambulate on indoor, level surfaces with assistive device, without AFO, modified independently x1000' with safe gait pattern (no ankle rolling or knee hyperextension or buckling) for full  independence with household ambulation   Status Achieved   Title Increase FOTO stroke impact scale mobility score to 55% for decreased disability due to CVA   Status Achieved   Title Increase DGI score to 19/24 without AFO for decreased fall risk   Status Not Met   Additional Long Term Goals Yes   Title (added 03/28/14) Renewal x1 week/1 visit needed for pt to demonstrate independence with finalized/consildated home exercise program.   Status Achieved          Plan - 04-23-14 1438    Clinical Impression Statement Pt is ready for discharge today.   PT Frequency Min 1X/week   PT Plan discharge today          G-Codes - 2014-04-23 1448    Functional Assessment Tool Used DGI scored 14/24; also ambulated with cane without AFO 1058' on level surfaces MOD I   Functional Limitation Mobility: Walking and moving around   Mobility: Walking and Moving Around Goal Status [phone]) At least 1 percent but less than 20 percent impaired, limited or restricted   Mobility: Walking and Moving Around Discharge Status [phone]) At least 20 percent but less than 40 percent impaired, limited or restricted      Problem List Patient Active Problem List   Diagnosis Date Noted  . 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 Disturbances of vision, late effect of  cerebrovascular disease 11/07/2013  . Sinus bradycardia 08/08/2013  . Spastic hemiplegia affecting nondominant side 07/26/2013  . Left-sided neglect 07/26/2013  . Small vessel disease 06/07/2013  . Obesity, unspecified 06/07/2013  . CVA (cerebral infarction) 06/07/2013  . large right middle cerebral artery infarct, embolic 15/83/0940  . Hypertension 06/03/2013  . Diabetes 06/03/2013                                            Delrae Sawyers D 04/04/2014, 3:02 PM

## 2014-04-04 NOTE — Therapy (Signed)
  Patient Details  Name: Donald [name] MRN: [id number] Date of Birth: Dec 06, 1945  Encounter Date: 04/04/2014  PHYSICAL THERAPY DISCHARGE SUMMARY  Visits from Start of Care: 50  Current functional level related to goals / functional outcomes: PT GOALS (most recent)      PT Goals - 04/04/14 1440     PT LONG TERM GOAL #1    Title  Verbalize plan for community fitness    Status  Achieved    PT LONG TERM GOAL #2    Title  Ambulate on outdoor, unlevel concrete including curbs and ramps x1000' MOD I with can if needed    Status  Not Met    PT LONG TERM GOAL #3    Title  Ambulate on indoor, level surfaces with assistive device, without AFO, modified independently x1000' with safe gait pattern (no ankle rolling or knee hyperextension or buckling) for full independence with household ambulation    Status  Achieved    PT LONG TERM GOAL #4    Title  Increase FOTO stroke impact scale mobility score to 55% for decreased disability due to CVA    Status  Achieved    PT LONG TERM GOAL #5    Title  Increase DGI score to 19/24 without AFO for decreased fall risk    Status  Not Met    PT LONG TERM GOAL #6    Title  (added 03/28/14) Renewal x1 week/1 visit needed for pt to demonstrate independence with finalized/consildated home exercise program.    Status  Achieved        *see all renewals since eval for progress of all prior goals   Remaining deficits: Gait impairments--however this has improved, motor planning impairment, weakness, L ankle instability   Education / Equipment: Home exercise program, CVA warning signs and risk factors  Plan: Patient agrees to discharge.  Patient goals were partially met. Patient is being discharged due to                                                     ?????plateau in progress, and the fact that pt will benefit from a break from therapy.        Ladeana Laplant, Anderson Malta D 04/04/2014, 3:16 PM

## 2014-04-07 ENCOUNTER — Ambulatory Visit (INDEPENDENT_AMBULATORY_CARE_PROVIDER_SITE_OTHER): Payer: Medicare Other | Admitting: *Deleted

## 2014-04-07 DIAGNOSIS — I639 Cerebral infarction, unspecified: Secondary | ICD-10-CM

## 2014-04-11 LAB — MDC_IDC_ENUM_SESS_TYPE_REMOTE

## 2014-04-11 NOTE — Progress Notes (Signed)
Loop recorder 

## 2014-04-14 ENCOUNTER — Ambulatory Visit: Payer: Medicare Other | Admitting: Physical Medicine & Rehabilitation

## 2014-04-19 ENCOUNTER — Encounter: Payer: Self-pay | Admitting: Neurology

## 2014-04-20 ENCOUNTER — Encounter: Payer: Self-pay | Admitting: Internal Medicine

## 2014-05-01 ENCOUNTER — Ambulatory Visit (HOSPITAL_BASED_OUTPATIENT_CLINIC_OR_DEPARTMENT_OTHER): Payer: Medicare Other | Admitting: Physical Medicine & Rehabilitation

## 2014-05-01 ENCOUNTER — Encounter: Payer: Medicare Other | Attending: Physical Medicine & Rehabilitation

## 2014-05-01 ENCOUNTER — Encounter: Payer: Self-pay | Admitting: Physical Medicine & Rehabilitation

## 2014-05-01 VITALS — BP 149/80 | HR 60 | Resp 14 | Wt 224.0 lb

## 2014-05-01 DIAGNOSIS — R414 Neurologic neglect syndrome: Secondary | ICD-10-CM | POA: Diagnosis not present

## 2014-05-01 DIAGNOSIS — G8110 Spastic hemiplegia affecting unspecified side: Secondary | ICD-10-CM | POA: Diagnosis not present

## 2014-05-01 DIAGNOSIS — I6340 Cerebral infarction due to embolism of unspecified cerebral artery: Secondary | ICD-10-CM | POA: Diagnosis not present

## 2014-05-01 NOTE — Progress Notes (Signed)
Subjective:    Patient ID: Donald [name], male    DOB: [birth year], 68 y.o.   MRN: [id number]  HPI  History of right MCA infarct in January 2015  Patient returns todayAfter last visit in September. Has taken time off  From PT and OT Has had a fall but no injury couple weeks ago, He tried going up steps even though he knew he had never tried it before he tried going up 2 steps at once Patient is compliant with home exercise program Had visual evaluation by neuro ophthalmologist that showed no significant visual field deficits. Pain Inventory Average Pain 0 Pain Right Now 0 My pain is no pain  In the last 24 hours, has pain interfered with the following? General activity 0 Relation with others 0 Enjoyment of life 0 What TIME of day is your pain at its worst? no pain Sleep (in general) no pain  Pain is worse with: no pain Pain improves with: no pain Relief from Meds: no pain  Mobility walk without assistance use a cane how many minutes can you walk? 20 ability to climb steps?  yes do you drive?  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  no  Physicians involved in your care Any changes since last visit?  no   Family History  Problem Relation Age of Onset  . Dementia Mother   . Diabetes Mother   . Heart attack Father    History   Social History  . Marital Status: Married    Spouse Name: [name]    Number of Children: 2  . Years of Education: college   Occupational History  . retired    Social History Main Topics  . Smoking status: Former Research scientist (life sciences)  . Smokeless tobacco: Never Used     Comment: Quit 25 years ago.  . Alcohol Use: 0.6 oz/week    1 Glasses of wine per week     Comment: Rare  . Drug Use: No  . Sexual Activity: No   Other Topics Concern  . None   Social History Narrative     Patient lives at home [place]).   Retired works part time 10 hours a week.   Right handed   Education college   Caffeine one cup daily coffee   Past Surgical History  Procedure Laterality Date  . Tee without cardioversion N/A 06/07/2013    Procedure: TRANSESOPHAGEAL ECHOCARDIOGRAM (TEE);  Surgeon: [name], MD;  Location: [facility] ENDOSCOPY;  Service: Cardiovascular;  Laterality: N/A;  . 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 Hypertension   . Diabetes mellitus without complication   . Hyperlipemia   . large right middle cerebral artery infarct, embolic 11/05/2945    a. s/p IV tPA, b. source unknown, c. loop recorder placed   BP 149/80 mmHg  Pulse 60  Resp 14  Wt 224 lb (101.606 kg)  SpO2 98%  Opioid Risk Score:   Fall Risk Score: Moderate Fall Risk (6-13 points) (previoulsy educated and given handout)  Review of Systems  All other systems reviewed and are negative.      Objective:   Physical Exam  HENT:  Head: Normocephalic and atraumatic.  Eyes: Conjunctivae are normal. Pupils are equal,  round, and reactive to light.  No shoulder pain with ROM full ROM  No Clonus at fingers , MAS1 at finger and wrist flexors  Motor strength 4-deltoids biceps triceps grip  4 + left hip flexion 4+ left knee extension,no edema left dorsal hand and wrist  Right-sided strength normal  Thumb opposition can oppose little finger on the left , Still slow and less precise with left hand compared to right hand Mood and affect are appropriate   Sensation diminshed Sharp/dull left hand  Gait with cane        Assessment & Plan:  1. R MCA infarct Left HP and Hemisensory deficits, continue outpatient PT and OT. Discussed multiple issues with patient and his wife. Overall has been making improvements with left upper extremity sensation as well as left upper extremity and left lower extremity  strength   Wife is requesting heel lift as they have lost the one they had before 2. HTN per PCP , with weight loss blood pressure control is improving and starting to reduce medications  3. Neuro optho eval- formal visual field testing performed Do not have results. advise driving On the road evaluation 4. Probable diabetic neuropathy. Patient does note that his hemoglobin A1c has never been much above 6

## 2014-05-01 NOTE — Patient Instructions (Addendum)
3D puzzles  Dr Edsel Petrin  Heel lift-

## 2014-05-11 ENCOUNTER — Encounter (HOSPITAL_COMMUNITY): Payer: Self-pay | Admitting: Internal Medicine

## 2014-05-19 ENCOUNTER — Telehealth: Payer: Self-pay | Admitting: Cardiology

## 2014-05-19 ENCOUNTER — Telehealth: Payer: Self-pay | Admitting: Internal Medicine

## 2014-05-19 ENCOUNTER — Encounter: Payer: Self-pay | Admitting: Cardiology

## 2014-05-19 ENCOUNTER — Ambulatory Visit: Payer: Medicare Other | Admitting: *Deleted

## 2014-05-19 NOTE — Telephone Encounter (Signed)
LMOVM for pt to return call 

## 2014-05-19 NOTE — Telephone Encounter (Signed)
New Message      Patient returning call from you. Patient says that they person called 3 times and he would like a call back please to know what was needed. Thanks

## 2014-05-19 NOTE — Telephone Encounter (Signed)
Route to device clinic

## 2014-05-19 NOTE — Telephone Encounter (Signed)
Pt will send manual transmission when he gets home.

## 2014-05-19 NOTE — Telephone Encounter (Signed)
LMOVM requesting that pt send manual transmission b/c home monitor has been disconnected.   

## 2014-06-09 ENCOUNTER — Telehealth: Payer: Self-pay | Admitting: Cardiology

## 2014-06-09 ENCOUNTER — Encounter: Payer: Self-pay | Admitting: Cardiology

## 2014-06-09 NOTE — Telephone Encounter (Signed)
LMOVM requesting that pt send manual transmission b/c home monitor has been disconnected.   

## 2014-06-15 DIAGNOSIS — I639 Cerebral infarction, unspecified: Secondary | ICD-10-CM | POA: Diagnosis not present

## 2014-06-15 DIAGNOSIS — I10 Essential (primary) hypertension: Secondary | ICD-10-CM | POA: Diagnosis not present

## 2014-06-15 DIAGNOSIS — E119 Type 2 diabetes mellitus without complications: Secondary | ICD-10-CM | POA: Diagnosis not present

## 2014-06-15 DIAGNOSIS — L309 Dermatitis, unspecified: Secondary | ICD-10-CM | POA: Diagnosis not present

## 2014-06-15 DIAGNOSIS — M6530 Trigger finger, unspecified finger: Secondary | ICD-10-CM | POA: Diagnosis not present

## 2014-06-20 LAB — MDC_IDC_ENUM_SESS_TYPE_REMOTE
Date Time Interrogation Session: 20151219225228
Zone Setting Detection Interval: 2000 ms
Zone Setting Detection Interval: 3000 ms
Zone Setting Detection Interval: 360 ms

## 2014-06-22 ENCOUNTER — Ambulatory Visit: Payer: Medicare Other | Admitting: Nurse Practitioner

## 2014-06-23 ENCOUNTER — Ambulatory Visit: Payer: Medicare Other | Admitting: Nurse Practitioner

## 2014-07-05 ENCOUNTER — Encounter: Payer: Self-pay | Admitting: Adult Health

## 2014-07-05 ENCOUNTER — Ambulatory Visit (INDEPENDENT_AMBULATORY_CARE_PROVIDER_SITE_OTHER): Payer: Medicare Other | Admitting: Adult Health

## 2014-07-05 VITALS — BP 141/88 | HR 62 | Ht 70.0 in | Wt 221.0 lb

## 2014-07-05 DIAGNOSIS — R269 Unspecified abnormalities of gait and mobility: Secondary | ICD-10-CM | POA: Diagnosis not present

## 2014-07-05 DIAGNOSIS — I6930 Unspecified sequelae of cerebral infarction: Secondary | ICD-10-CM

## 2014-07-05 DIAGNOSIS — M6289 Other specified disorders of muscle: Secondary | ICD-10-CM | POA: Diagnosis not present

## 2014-07-05 DIAGNOSIS — R531 Weakness: Secondary | ICD-10-CM

## 2014-07-05 NOTE — Patient Instructions (Signed)
Continue clopidogrel 75 mg orally every day for secondary stroke prevention and maintain strict control of hypertension with blood pressure goal below 130/90, diabetes with hemoglobin A1c goal below 6.5% and lipids with LDL cholesterol goal below 100 mg/dL

## 2014-07-05 NOTE — Progress Notes (Signed)
PATIENT: Donald [name] DOB: 1946/04/27  REASON FOR VISIT: follow up- stroke, abnormality of gait, left sided weakness.  HISTORY FROM: patient  HISTORY OF PRESENT ILLNESS: Donald [name] is a 69 year old male with a history of stroke on 06/03/13. He returns today for follow-up. He is currently taking Plavix for stroke prevention. His HTN, cholesterol and Diabetes is managed by his PCP. He states that his BP has been controlled. When he checks it at home it is usually 130s/70s.  His states that his last hemoglobin A1C was 5.9 % . He continues to take metformin- his dosage has recently been decreased. His cholesterol has been in normal range according to the patient and therefore medication has not been required. Patient will use a cane when he is out in public. He does not use it at home. He feels that his walking has improved. Denies any falls. He notes that his fine motor skills in the left hand has improved. He states that he is now able to cook at home. He does have some sensory changes in the left hand and foot. Denies any new symptoms.   HISTORY 12/20/13 ([name]): is an 69 y.o. male who was visiting here for the holidays on 06/03/2013.  When he got up he was normal. As he was returning to bed began to be short of breath. Patient then was noted to have slurred speech by his wife and she noted a facial droop. Patient seemed to be weak on the left a well. EMS was called and the patient was brought in as a code stroke. Initial NIHSS of 8. Patient was given TPA. No intervenable lesion was seen on CTA. He was admitted to the neuro ICU for further evaluation and treatment.  CT angio head showed Subtle attenuation of the distal right superior MCA territory branch vessels as compared to the left. No proximal branch occlusion or high-grade flow-limiting stenosis identified within the right middle cerebral artery proximally.  Otherwise unremarkable CTA of the head without evidence of occlusion, high-grade  stenosis, or aneurysm elsewhere within the brain.   CT angio neck was a normal CTA of the neck without evidence of high-grade stenosis, occlusion, or dissection. 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  MRI of the brain showed an acute large right middle cerebral artery territory infarct. Minimal underlying petechial hemorrhage without lobar hematoma. 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 Minimal additional white matter changes suggest chronic small vessel ischemic disease. Acute on chronic paranasal sinusitis. 2D Echocardiogram with the estimated ejection fraction was in the range of 60% to 65%. He was admitted to [facility] 06/07/2013 through 07/02/2013. He is completed home physical therapy and occupational therapy and is about to start going to outpatient neuro rehabilitation.  He is making good progress with his rehabilitation and is very determined.  He and his wife have moved in with her daughter here in [place]. He has established care with Dr. Orland Mustard and is seeing him every 3 weeks for adjustments to his hypertensive medications, as his hypertension has been very difficult to control since his 65s.  He thinks he forgot to take his blood pressure medicine the day he had the stroke.  His blood pressure is elevated office today at 161/90, he is tolerating Plavix well without any significant bruising or bleeding. He is able to ambulate short distances with a hemiwalker and AFO brace on the left. He is having some spasticity in the left arm with  clonus at times. Update 12/20/2013 : He returns for followup after last visit 3 months ago. He states is doing well and has not had recurrent TIA her stroke symptoms. He states his worked hard on his diet and lifestyle changes and has in fact lost weight his hemoglobin A1c had dropped less than 5 hence glyburide was discontinued but the latest one was up to 6.2 metformin dose has been increased.  He has lost 13 pounds weight since her last visit. He states his blood pressure usually once in the 130s at home though it was elevated on today's visit at 166/74 but it did come down to 148/8 for after resting.  He continues to walk with a foot brace but feels now he can walk further since his has lost weight and is feeling better  REVIEW OF SYSTEMS: Out of a complete 14 system review of symptoms, the patient complains only of the following symptoms, and all other reviewed systems are negative.  See HPI  ALLERGIES: No Known Allergies  HOME MEDICATIONS: Outpatient Prescriptions Prior to Visit  Medication Sig Dispense Refill  . amLODipine (NORVASC) 10 MG tablet Take 1 tablet (10 mg total) by mouth daily. 30 tablet 1  . carvedilol (COREG) 25 MG tablet Take 1 tablet (25 mg total) by mouth 2 (two) times daily with a meal. 60 tablet 1  . cloNIDine (CATAPRES) 0.3 MG tablet Take 0.3 mg by mouth 2 (two) times daily.    . clopidogrel (PLAVIX) 75 MG tablet Take 1 tablet (75 mg total) by mouth daily with breakfast. 30 tablet 1  . hydrALAZINE (APRESOLINE) 10 MG tablet Take 10 mg by mouth once as needed.     . hydrochlorothiazide (MICROZIDE) 12.5 MG capsule Take 25 mg by mouth daily.     Marland Kitchen lisinopril (PRINIVIL,ZESTRIL) 40 MG tablet Take 1 tablet (40 mg total) by mouth daily. 30 tablet 1  . metFORMIN (GLUCOPHAGE-XR) 500 MG 24 hr tablet Take 750 mg by mouth 2 (two) times daily.      No facility-administered medications prior to visit.    PAST MEDICAL HISTORY: Past Medical History  Diagnosis Date  . Hypertension   . Diabetes mellitus without complication   . Hyperlipemia   . large right middle cerebral artery infarct, embolic 12/06/7207    a. s/p IV tPA, b. source unknown, c. loop recorder placed    PAST SURGICAL HISTORY: Past Surgical History  Procedure Laterality Date  . Tee without cardioversion N/A 06/07/2013    Procedure: TRANSESOPHAGEAL ECHOCARDIOGRAM (TEE);  Surgeon: [name], MD;   Location: [facility] ENDOSCOPY;  Service: Cardiovascular;  Laterality: N/A;  . Loop recorder implant  06/07/2013    MDT LinQ implanted by Dr [name] for cryptogenic stroke  . Loop recorder implant N/A 06/07/2013    Procedure: LOOP RECORDER IMPLANT;  Surgeon: [name], MD;  Location: [facility] CATH LAB;  Service: Cardiovascular;  Laterality: N/A;    FAMILY HISTORY: Family History  Problem Relation Age of Onset  . Dementia Mother   . Diabetes Mother   . Heart attack Father     SOCIAL HISTORY: History   Social History  . Marital Status: Married    Spouse Name: [name]    Number of Children: 2  . Years of Education: college   Occupational History  . retired    Social History Main Topics  . Smoking status: Former Research scientist (life sciences)  . Smokeless tobacco: Never Used     Comment: Quit 25 years ago.  . Alcohol Use:  0.6 oz/week    1 Glasses of wine per week     Comment: Rare  . Drug Use: No  . Sexual Activity: No   Other Topics Concern  . Not on file   Social History Narrative   Patient lives at home [name]).   Retired works part time 10 hours a week.   Right handed   Education college   Caffeine one cup daily coffee      PHYSICAL EXAM  Filed Vitals:   07/05/14 1500  BP: 141/88  Pulse: 62  Height: 5\' 10"  (1.778 m)  Weight: 221 lb (100.245 kg)   Body mass index is 31.71 kg/(m^2).  Generalized: Well developed, in no acute distress   Neurological examination  Mentation: Alert oriented to time, place, history taking. Follows all commands speech and language fluent Cranial nerve II-XII: Pupils were equal round reactive to light. Extraocular movements were full, visual field were full on confrontational test. Facial sensation and strength were normal. Uvula tongue midline. Head turning and shoulder shrug  were normal and symmetric. Motor: The motor testing reveals 5 over 5 strength in the right upper and lower extremity. 4/5 strength is the left upper and lower extremity.  Good symmetric  motor tone is noted throughout.  Sensory: Sensory testing is intact to soft touch on all 4 extremities. No evidence of extinction is noted.  Coordination: Cerebellar testing reveals good finger-nose-finger on the right, mild ataxia on the left. Good heel-to-shin bilaterally.  Gait and station: uses a cane to ambulate. Has a circumduction type gait on the left. Tandem gait not attempted.  Romberg is negative. No drift is seen.  Reflexes: Deep tendon reflexes are hyperreflexic on the left, normal on the right.  Marland Kitchen   DIAGNOSTIC DATA (LABS, IMAGING, TESTING) - I reviewed patient records, labs, notes, testing and imaging myself where available.   ASSESSMENT AND PLAN 69 y.o. year old male  has a past medical history of Hypertension; Diabetes mellitus without complication; Hyperlipemia; and large right middle cerebral artery infarct, embolic (01/01/4234). here with:  1. History of CVA with residual deficits.  2. Abnormality of gait 3. Left sided weakness  Continue clopidogrel 75 mg orally every day for secondary stroke prevention and maintain strict control of hypertension with blood pressure goal below 130/90, diabetes with hemoglobin A1c goal below 6.5% and lipids with LDL cholesterol goal below 100 mg/dL. Continue with light exercise at home and continue to choose healthy food options.   Ward Givens, MSN, NP-C 07/05/2014, 3:07 PM [facility] [address] [place], [place] [address] [phone]  Note: This document was prepared with digital dictation and possible smart phrase technology. Any transcriptional errors that result from this process are unintentional.

## 2014-07-06 ENCOUNTER — Encounter: Payer: Self-pay | Admitting: Internal Medicine

## 2014-07-06 NOTE — Progress Notes (Signed)
I agree with the above plan 

## 2014-07-10 ENCOUNTER — Ambulatory Visit (INDEPENDENT_AMBULATORY_CARE_PROVIDER_SITE_OTHER): Payer: Medicare Other | Admitting: *Deleted

## 2014-07-10 DIAGNOSIS — I639 Cerebral infarction, unspecified: Secondary | ICD-10-CM | POA: Diagnosis not present

## 2014-07-10 NOTE — Progress Notes (Signed)
Loop recorder 

## 2014-07-18 ENCOUNTER — Encounter: Payer: Self-pay | Admitting: Cardiology

## 2014-07-23 LAB — MDC_IDC_ENUM_SESS_TYPE_REMOTE
Date Time Interrogation Session: 20160127030715
Zone Setting Detection Interval: 2000 ms
Zone Setting Detection Interval: 3000 ms
Zone Setting Detection Interval: 360 ms

## 2014-07-31 DIAGNOSIS — B351 Tinea unguium: Secondary | ICD-10-CM | POA: Diagnosis not present

## 2014-07-31 DIAGNOSIS — E119 Type 2 diabetes mellitus without complications: Secondary | ICD-10-CM | POA: Diagnosis not present

## 2014-07-31 DIAGNOSIS — M79674 Pain in right toe(s): Secondary | ICD-10-CM | POA: Diagnosis not present

## 2014-08-04 ENCOUNTER — Encounter: Payer: Self-pay | Admitting: Cardiology

## 2014-08-04 ENCOUNTER — Encounter: Payer: Self-pay | Admitting: Internal Medicine

## 2014-08-08 ENCOUNTER — Ambulatory Visit (INDEPENDENT_AMBULATORY_CARE_PROVIDER_SITE_OTHER): Payer: Medicare Other | Admitting: *Deleted

## 2014-08-08 DIAGNOSIS — I639 Cerebral infarction, unspecified: Secondary | ICD-10-CM | POA: Diagnosis not present

## 2014-08-09 NOTE — Progress Notes (Signed)
Loop recorder 

## 2014-08-11 DIAGNOSIS — D229 Melanocytic nevi, unspecified: Secondary | ICD-10-CM | POA: Diagnosis not present

## 2014-08-11 DIAGNOSIS — D1801 Hemangioma of skin and subcutaneous tissue: Secondary | ICD-10-CM | POA: Diagnosis not present

## 2014-08-23 LAB — MDC_IDC_ENUM_SESS_TYPE_REMOTE
Date Time Interrogation Session: 20160310213251
Zone Setting Detection Interval: 2000 ms
Zone Setting Detection Interval: 3000 ms
Zone Setting Detection Interval: 360 ms

## 2014-08-25 ENCOUNTER — Encounter: Payer: Self-pay | Admitting: Cardiology

## 2014-09-06 ENCOUNTER — Encounter: Payer: Self-pay | Admitting: Internal Medicine

## 2014-09-07 ENCOUNTER — Ambulatory Visit (INDEPENDENT_AMBULATORY_CARE_PROVIDER_SITE_OTHER): Payer: Medicare Other | Admitting: *Deleted

## 2014-09-07 DIAGNOSIS — I639 Cerebral infarction, unspecified: Secondary | ICD-10-CM

## 2014-09-11 NOTE — Progress Notes (Signed)
Loop recorder 

## 2014-09-15 DIAGNOSIS — Z1211 Encounter for screening for malignant neoplasm of colon: Secondary | ICD-10-CM | POA: Diagnosis not present

## 2014-09-15 DIAGNOSIS — E119 Type 2 diabetes mellitus without complications: Secondary | ICD-10-CM | POA: Diagnosis not present

## 2014-09-15 DIAGNOSIS — Z125 Encounter for screening for malignant neoplasm of prostate: Secondary | ICD-10-CM | POA: Diagnosis not present

## 2014-09-15 DIAGNOSIS — I10 Essential (primary) hypertension: Secondary | ICD-10-CM | POA: Diagnosis not present

## 2014-09-15 DIAGNOSIS — Z0000 Encounter for general adult medical examination without abnormal findings: Secondary | ICD-10-CM | POA: Diagnosis not present

## 2014-09-15 DIAGNOSIS — N529 Male erectile dysfunction, unspecified: Secondary | ICD-10-CM | POA: Diagnosis not present

## 2014-09-15 DIAGNOSIS — I639 Cerebral infarction, unspecified: Secondary | ICD-10-CM | POA: Diagnosis not present

## 2014-09-19 ENCOUNTER — Encounter (HOSPITAL_COMMUNITY): Payer: Self-pay

## 2014-09-19 ENCOUNTER — Emergency Department (HOSPITAL_COMMUNITY): Payer: Medicare Other

## 2014-09-19 ENCOUNTER — Inpatient Hospital Stay (HOSPITAL_COMMUNITY)
Admission: EM | Admit: 2014-09-19 | Discharge: 2014-09-20 | DRG: 101 | Disposition: A | Discharge status: Home / Self Care | Payer: Medicare Other | Attending: Internal Medicine | Admitting: Internal Medicine

## 2014-09-19 DIAGNOSIS — Z7902 Long term (current) use of antithrombotics/antiplatelets: Secondary | ICD-10-CM

## 2014-09-19 DIAGNOSIS — Z87891 Personal history of nicotine dependence: Secondary | ICD-10-CM

## 2014-09-19 DIAGNOSIS — R253 Fasciculation: Secondary | ICD-10-CM | POA: Diagnosis not present

## 2014-09-19 DIAGNOSIS — E785 Hyperlipidemia, unspecified: Secondary | ICD-10-CM | POA: Diagnosis not present

## 2014-09-19 DIAGNOSIS — G40209 Localization-related (focal) (partial) symptomatic epilepsy and epileptic syndromes with complex partial seizures, not intractable, without status epilepticus: Secondary | ICD-10-CM | POA: Diagnosis not present

## 2014-09-19 DIAGNOSIS — R569 Unspecified convulsions: Secondary | ICD-10-CM | POA: Diagnosis not present

## 2014-09-19 DIAGNOSIS — I6340 Cerebral infarction due to embolism of unspecified cerebral artery: Secondary | ICD-10-CM | POA: Diagnosis not present

## 2014-09-19 DIAGNOSIS — E876 Hypokalemia: Secondary | ICD-10-CM | POA: Diagnosis present

## 2014-09-19 DIAGNOSIS — R29810 Facial weakness: Secondary | ICD-10-CM | POA: Diagnosis not present

## 2014-09-19 DIAGNOSIS — R251 Tremor, unspecified: Secondary | ICD-10-CM | POA: Diagnosis not present

## 2014-09-19 DIAGNOSIS — Z8673 Personal history of transient ischemic attack (TIA), and cerebral infarction without residual deficits: Secondary | ICD-10-CM | POA: Diagnosis not present

## 2014-09-19 DIAGNOSIS — I639 Cerebral infarction, unspecified: Secondary | ICD-10-CM

## 2014-09-19 DIAGNOSIS — I10 Essential (primary) hypertension: Secondary | ICD-10-CM | POA: Diagnosis not present

## 2014-09-19 DIAGNOSIS — E119 Type 2 diabetes mellitus without complications: Secondary | ICD-10-CM | POA: Diagnosis not present

## 2014-09-19 DIAGNOSIS — G9389 Other specified disorders of brain: Secondary | ICD-10-CM | POA: Diagnosis not present

## 2014-09-19 DIAGNOSIS — G40109 Localization-related (focal) (partial) symptomatic epilepsy and epileptic syndromes with simple partial seizures, not intractable, without status epilepticus: Secondary | ICD-10-CM | POA: Diagnosis not present

## 2014-09-19 DIAGNOSIS — I6789 Other cerebrovascular disease: Secondary | ICD-10-CM | POA: Diagnosis not present

## 2014-09-19 LAB — I-STAT CHEM 8, ED
BUN: 18 mg/dL (ref 6–23)
Calcium, Ion: 1.03 mmol/L — ABNORMAL LOW (ref 1.13–1.30)
Chloride: 99 mmol/L (ref 96–112)
Creatinine, Ser: 1 mg/dL (ref 0.50–1.35)
Glucose, Bld: 131 mg/dL — ABNORMAL HIGH (ref 70–99)
HCT: 41 % (ref 39.0–52.0)
Hemoglobin: 13.9 g/dL (ref 13.0–17.0)
Potassium: 3.3 mmol/L — ABNORMAL LOW (ref 3.5–5.1)
Sodium: 137 mmol/L (ref 135–145)
TCO2: 19 mmol/L (ref 0–100)

## 2014-09-19 LAB — COMPREHENSIVE METABOLIC PANEL
ALT: 13 U/L (ref 0–53)
AST: 24 U/L (ref 0–37)
Albumin: 3.9 g/dL (ref 3.5–5.2)
Alkaline Phosphatase: 58 U/L (ref 39–117)
Anion gap: 18 — ABNORMAL HIGH (ref 5–15)
BUN: 14 mg/dL (ref 6–23)
CO2: 20 mmol/L (ref 19–32)
Calcium: 9.1 mg/dL (ref 8.4–10.5)
Chloride: 98 mmol/L (ref 96–112)
Creatinine, Ser: 1.14 mg/dL (ref 0.50–1.35)
GFR calc Af Amer: 76 mL/min — ABNORMAL LOW (ref >90)
GFR calc non Af Amer: 65 mL/min — ABNORMAL LOW (ref >90)
Glucose, Bld: 128 mg/dL — ABNORMAL HIGH (ref 70–99)
Potassium: 3.3 mmol/L — ABNORMAL LOW (ref 3.5–5.1)
Sodium: 136 mmol/L (ref 135–145)
Total Bilirubin: 1.1 mg/dL (ref 0.3–1.2)
Total Protein: 7 g/dL (ref 6.0–8.3)

## 2014-09-19 LAB — PROTIME-INR
INR: 1.06 (ref 0.00–1.49)
Prothrombin Time: 13.9 seconds (ref 11.6–15.2)

## 2014-09-19 LAB — DIFFERENTIAL
Basophils Absolute: 0.1 10*3/uL (ref 0.0–0.1)
Basophils Relative: 1 % (ref 0–1)
Eosinophils Absolute: 0.2 10*3/uL (ref 0.0–0.7)
Eosinophils Relative: 2 % (ref 0–5)
Lymphocytes Relative: 40 % (ref 12–46)
Lymphs Abs: 4.3 10*3/uL — ABNORMAL HIGH (ref 0.7–4.0)
Monocytes Absolute: 0.8 10*3/uL (ref 0.1–1.0)
Monocytes Relative: 8 % (ref 3–12)
Neutro Abs: 5.3 10*3/uL (ref 1.7–7.7)
Neutrophils Relative %: 50 % (ref 43–77)

## 2014-09-19 LAB — CBC
HCT: 39.8 % (ref 39.0–52.0)
Hemoglobin: 14.7 g/dL (ref 13.0–17.0)
MCH: 34.1 pg — ABNORMAL HIGH (ref 26.0–34.0)
MCHC: 36.9 g/dL — ABNORMAL HIGH (ref 30.0–36.0)
MCV: 92.3 fL (ref 78.0–100.0)
Platelets: 216 10*3/uL (ref 150–400)
RBC: 4.31 MIL/uL (ref 4.22–5.81)
RDW: 13.6 % (ref 11.5–15.5)
WBC: 10.7 10*3/uL — ABNORMAL HIGH (ref 4.0–10.5)

## 2014-09-19 LAB — I-STAT TROPONIN, ED: Troponin i, poc: 0 ng/mL (ref 0.00–0.08)

## 2014-09-19 LAB — APTT: aPTT: 30 seconds (ref 24–37)

## 2014-09-19 LAB — CBG MONITORING, ED: Glucose-Capillary: 144 mg/dL — ABNORMAL HIGH (ref 70–99)

## 2014-09-19 LAB — GLUCOSE, CAPILLARY: Glucose-Capillary: 214 mg/dL — ABNORMAL HIGH (ref 70–99)

## 2014-09-19 MED ORDER — ACETAMINOPHEN 325 MG PO TABS: 325.0000 mg | ORAL_TABLET | Freq: Four times a day (QID) | ORAL | Status: DC | PRN

## 2014-09-19 MED ORDER — CLONIDINE HCL 0.1 MG PO TABS
0.3000 mg | ORAL_TABLET | Freq: Two times a day (BID) | ORAL | Status: DC
  Administered 2014-09-19: 0.3 mg via ORAL
  Filled 2014-09-19 (×2): qty 3

## 2014-09-19 MED ORDER — LEVETIRACETAM 500 MG PO TABS
500.0000 mg | ORAL_TABLET | Freq: Two times a day (BID) | ORAL | Status: DC
  Administered 2014-09-20: 500 mg via ORAL
  Filled 2014-09-19: qty 1

## 2014-09-19 MED ORDER — LEVETIRACETAM 500 MG PO TABS
500.0000 mg | ORAL_TABLET | Freq: Once | ORAL | Status: AC
  Administered 2014-09-19: 500 mg via ORAL
  Filled 2014-09-19: qty 1

## 2014-09-19 MED ORDER — CARVEDILOL 12.5 MG PO TABS
25.0000 mg | ORAL_TABLET | Freq: Two times a day (BID) | ORAL | Status: DC
  Administered 2014-09-19: 25 mg via ORAL
  Filled 2014-09-19: qty 2

## 2014-09-19 MED ORDER — POTASSIUM CHLORIDE CRYS ER 20 MEQ PO TBCR
40.0000 meq | EXTENDED_RELEASE_TABLET | Freq: Once | ORAL | Status: AC
  Administered 2014-09-19: 40 meq via ORAL
  Filled 2014-09-19: qty 2

## 2014-09-19 MED ORDER — CLOPIDOGREL BISULFATE 75 MG PO TABS
75.0000 mg | ORAL_TABLET | Freq: Every day | ORAL | Status: DC
  Administered 2014-09-20: 75 mg via ORAL
  Filled 2014-09-19: qty 1

## 2014-09-19 MED ORDER — LISINOPRIL 20 MG PO TABS
40.0000 mg | ORAL_TABLET | Freq: Every day | ORAL | Status: DC
  Administered 2014-09-20: 40 mg via ORAL
  Filled 2014-09-19: qty 2

## 2014-09-19 MED ORDER — SENNOSIDES-DOCUSATE SODIUM 8.6-50 MG PO TABS: 1.0000 | ORAL_TABLET | Freq: Every evening | ORAL | Status: DC | PRN

## 2014-09-19 MED ORDER — STROKE: EARLY STAGES OF RECOVERY BOOK
Freq: Once | Status: AC
  Administered 2014-09-19: 22:00:00

## 2014-09-19 MED ORDER — HYDROCHLOROTHIAZIDE 12.5 MG PO CAPS
25.0000 mg | ORAL_CAPSULE | Freq: Every day | ORAL | Status: DC
Start: 2014-09-20 — End: 2014-09-20
  Administered 2014-09-20: 25 mg via ORAL
  Filled 2014-09-19: qty 2

## 2014-09-19 MED ORDER — HYDRALAZINE HCL 10 MG PO TABS
10.0000 mg | ORAL_TABLET | Freq: Every day | ORAL | Status: DC
  Administered 2014-09-20: 10 mg via ORAL
  Filled 2014-09-19: qty 1

## 2014-09-19 MED ORDER — AMLODIPINE BESYLATE 10 MG PO TABS
10.0000 mg | ORAL_TABLET | Freq: Every day | ORAL | Status: DC
  Administered 2014-09-19 – 2014-09-20 (×2): 10 mg via ORAL
  Filled 2014-09-19 (×2): qty 1

## 2014-09-19 MED ORDER — HEPARIN SODIUM (PORCINE) 5000 UNIT/ML IJ SOLN
5000.0000 [IU] | Freq: Three times a day (TID) | INTRAMUSCULAR | Status: DC
  Administered 2014-09-19 – 2014-09-20 (×3): 5000 [IU] via SUBCUTANEOUS
  Filled 2014-09-19 (×3): qty 1

## 2014-09-19 MED ORDER — INSULIN ASPART 100 UNIT/ML ~~LOC~~ SOLN
0.0000 [IU] | Freq: Three times a day (TID) | SUBCUTANEOUS | Status: DC
  Administered 2014-09-20: 2 [IU] via SUBCUTANEOUS

## 2014-09-19 NOTE — ED Notes (Signed)
DR. Doy Mince at the bedside updating the family at the bedside.

## 2014-09-19 NOTE — Consult Note (Signed)
Referring Physician: Roxanne Mins    Chief Complaint: Left sided spasticity  HPI: Donald [name] is an 69 y.o. male with a history of a right MCA infarct in January of 2015 who was at his baseline today.  After going on a walk with his wife while talking at the table was noted to have a drawing of his left face and drawing of his fingers on the left hand.  Arm began to jerk.  Speech was not affected.  Symptoms lasted about 15 minutes and resolved.  EMS was called and the patient was brought in as a code stroke.  Patient reports being at baseline on presentation.   Initial NIHSS of 5.    Date last known well: Date: 09/19/2014 Time last known well: Time: 17:00 tPA Given: No: Resolution of symptoms  Past Medical History  Diagnosis Date  . Hypertension   . Diabetes mellitus without complication   . Hyperlipemia   . large right middle cerebral artery infarct, embolic 07/31/5398    a. s/p IV tPA, b. source unknown, c. loop recorder placed    Past Surgical History  Procedure Laterality Date  . Tee without cardioversion N/A 06/07/2013    Procedure: TRANSESOPHAGEAL ECHOCARDIOGRAM (TEE);  Surgeon: [name], MD;  Location: [facility] ENDOSCOPY;  Service: Cardiovascular;  Laterality: N/A;  . Loop recorder implant  06/07/2013    MDT LinQ implanted by Dr [name] for cryptogenic stroke  . Loop recorder implant N/A 06/07/2013    Procedure: LOOP RECORDER IMPLANT;  Surgeon: [name], MD;  Location: [place] CATH LAB;  Service: Cardiovascular;  Laterality: N/A;    Family History  Problem Relation Age of Onset  . Dementia Mother   . Diabetes Mother   . Heart attack Father    Social History:  reports that he has quit smoking. He has never used smokeless tobacco. He reports that he drinks about 0.6 oz of alcohol per week. He reports that he does not use illicit drugs.  Allergies: No Known Allergies  Medications: I have reviewed the patient's current medications. Prior to Admission:  Current outpatient  prescriptions:  .  amLODipine (NORVASC) 10 MG tablet, Take 1 tablet (10 mg total) by mouth daily., Disp: 30 tablet, Rfl: 1 .  carvedilol (COREG) 25 MG tablet, Take 1 tablet (25 mg total) by mouth 2 (two) times daily with a meal., Disp: 60 tablet, Rfl: 1 .  cloNIDine (CATAPRES) 0.3 MG tablet, Take 0.3 mg by mouth 2 (two) times daily., Disp: , Rfl:  .  clopidogrel (PLAVIX) 75 MG tablet, Take 1 tablet (75 mg total) by mouth daily with breakfast., Disp: 30 tablet, Rfl: 1 .  hydrALAZINE (APRESOLINE) 10 MG tablet, Take 10 mg by mouth once as needed. , Disp: , Rfl:  .  hydrochlorothiazide (MICROZIDE) 12.5 MG capsule, Take 25 mg by mouth daily. , Disp: , Rfl:  .  lisinopril (PRINIVIL,ZESTRIL) 40 MG tablet, Take 1 tablet (40 mg total) by mouth daily., Disp: 30 tablet, Rfl: 1 .  metFORMIN (GLUCOPHAGE-XR) 500 MG 24 hr tablet, Take 750 mg by mouth 2 (two) times daily. , Disp: , Rfl:   ROS: History obtained from the patient  General ROS: negative for - chills, fatigue, fever, night sweats, weight gain or weight loss Psychological ROS: negative for - behavioral disorder, hallucinations, memory difficulties, mood swings or suicidal ideation Ophthalmic ROS: negative for - blurry vision, double vision, eye pain or loss of vision ENT ROS: negative for - epistaxis, nasal discharge, oral lesions, sore throat,  tinnitus or vertigo Allergy and Immunology ROS: negative for - hives or itchy/watery eyes Hematological and Lymphatic ROS: negative for - bleeding problems, bruising or swollen lymph nodes Endocrine ROS: negative for - galactorrhea, hair pattern changes, polydipsia/polyuria or temperature intolerance Respiratory ROS: negative for - cough, hemoptysis, shortness of breath or wheezing Cardiovascular ROS: BLE swelling Gastrointestinal ROS: negative for - abdominal pain, diarrhea, hematemesis, nausea/vomiting or stool incontinence Genito-Urinary ROS: negative for - dysuria, hematuria, incontinence or urinary  frequency/urgency Musculoskeletal ROS: coldness of the left arm and leg Neurological ROS: as noted in HPI Dermatological ROS: negative for rash and skin lesion changes  Physical Examination: Blood pressure 164/99, pulse 91, temperature 98.5 F (36.9 C), temperature source Oral, resp. rate 20, height 6' 3.5" (1.918 m), weight 99.338 kg (219 lb), SpO2 100 %.  HEENT-  Normocephalic, no lesions, without obvious abnormality.  Normal external eye and conjunctiva.  Normal TM's bilaterally.  Normal auditory canals and external ears. Normal external nose, mucus membranes and septum.  Normal pharynx. Cardiovascular- S1, S2 normal, pulses palpable throughout   Lungs- chest clear, no wheezing, rales, normal symmetric air entry Abdomen- soft, non-tender; bowel sounds normal; no masses,  no organomegaly Extremities- 2+ BLE edema (left greater than right) Lymph-no adenopathy palpable Musculoskeletal-no joint tenderness, deformity or swelling Skin-warm and dry, no hyperpigmentation, vitiligo, or suspicious lesions  Neurological Examination Mental Status: Alert, oriented, thought content appropriate.  Speech fluent without evidence of aphasia.  Able to follow 3 step commands without difficulty. Cranial Nerves: II: Discs flat bilaterally; Visual fields grossly normal, pupils equal, round, reactive to light and accommodation III,IV, VI: ptosis not present, extra-ocular motions intact bilaterally V,VII: decrease in left NLF, facial light touch sensation normal bilaterally VIII: hearing normal bilaterally IX,X: gag reflex present XI: bilateral shoulder shrug XII: midline tongue extension Motor: Right : Upper extremity   5/5    Left:     Upper extremity   5-/5  Lower extremity   5/5     Lower extremity   5-/5 with external rotation at the hip Tone and bulk:normal tone throughout; no atrophy noted Sensory: Pinprick and light touch intact throughout, bilaterally Deep Tendon Reflexes: Increased on the left  as compared to the right  Plantars: Right: downgoing   Left: upgoing Cerebellar: Dysmetria with finger to nose and heel to shin testing on the left.  Normal on the right. Gait: not tested due to safety concerns    Laboratory Studies:  Basic Metabolic Panel:  Recent Labs Lab 09/19/14 1908  NA 137  K 3.3*  CL 99  GLUCOSE 131*  BUN 18  CREATININE 1.00    Liver Function Tests: No results for input(s): AST, ALT, ALKPHOS, BILITOT, PROT, ALBUMIN in the last 168 hours. No results for input(s): LIPASE, AMYLASE in the last 168 hours. No results for input(s): AMMONIA in the last 168 hours.  CBC:  Recent Labs Lab 09/19/14 1856 09/19/14 1908  WBC 10.7*  --   NEUTROABS 5.3  --   HGB 14.7 13.9  HCT 39.8 41.0  MCV 92.3  --   PLT 216  --     Cardiac Enzymes: No results for input(s): CKTOTAL, CKMB, CKMBINDEX, TROPONINI in the last 168 hours.  BNP: Invalid input(s): POCBNP  CBG:  Recent Labs Lab 09/19/14 1918  GLUCAP 144*    Microbiology: Results for orders placed or performed during the hospital encounter of 06/03/13  MRSA PCR Screening     Status: None   Collection Time: 06/03/13  6:05 AM  Result Value Ref Range Status   MRSA by PCR NEGATIVE NEGATIVE Final    Comment:        The GeneXpert MRSA Assay (FDA approved for NASAL specimens only), is one component of a comprehensive MRSA colonization surveillance program. It is not intended to diagnose MRSA infection nor to guide or monitor treatment for MRSA infections.    Coagulation Studies:  Recent Labs  09/19/14 1856  LABPROT 13.9  INR 1.06    Urinalysis: No results for input(s): COLORURINE, LABSPEC, PHURINE, GLUCOSEU, HGBUR, BILIRUBINUR, KETONESUR, PROTEINUR, UROBILINOGEN, NITRITE, LEUKOCYTESUR in the last 168 hours.  Invalid input(s): APPERANCEUR  Lipid Panel:    Component Value Date/Time   CHOL 145 06/03/2013 0455   TRIG 163* 06/03/2013 0455   HDL 28* 06/03/2013 0455   CHOLHDL 5.2 06/03/2013  0455   VLDL 33 06/03/2013 0455   LDLCALC 84 06/03/2013 0455    HgbA1C:  Lab Results  Component Value Date   HGBA1C 6.2* 06/03/2013    Urine Drug Screen:     Component Value Date/Time   LABOPIA NONE DETECTED 06/03/2013 0605   COCAINSCRNUR NONE DETECTED 06/03/2013 0605   LABBENZ NONE DETECTED 06/03/2013 0605   AMPHETMU NONE DETECTED 06/03/2013 0605   THCU NONE DETECTED 06/03/2013 0605   LABBARB NONE DETECTED 06/03/2013 0605    Alcohol Level: No results for input(s): ETH in the last 168 hours.  Other results: EKG: 81 bpm.  Imaging: Ct Head (brain) Wo Contrast  09/19/2014   CLINICAL DATA:  Code stroke. Prior history of large right MCA territory infarct.  EXAM: CT HEAD WITHOUT CONTRAST  TECHNIQUE: Contiguous axial images were obtained from the base of the skull through the vertex without intravenous contrast.  COMPARISON:  06/03/2013  FINDINGS: There is no evidence of mass effect, midline shift, or extra-axial fluid collections. There is no evidence of a space-occupying lesion or intracranial hemorrhage. There is no evidence of a cortical-based area of acute infarction. There is an old right MCA territory infarct with encephalomalacia.  The ventricles and sulci are appropriate for the patient's age. The basal cisterns are patent.  Visualized portions of the orbits are unremarkable. The mastoid sinuses are clear. There is a left maxillary sinus mucous retention cyst.  The osseous structures are unremarkable.  IMPRESSION: 1. No acute intracranial pathology. 2. Old right MCA territory infarct with encephalomalacia. These results were called by telephone at the time of interpretation on 09/19/2014 at 7:11 pm to Dr. Doy Mince, who verbally acknowledged these results.   Electronically Signed   By: Kathreen Devoid   On: 09/19/2014 19:17    Assessment: 69 y.o. male with old right MCA infarct on Plavix with complaints of left upper extremity jerking and increase in left facial droop.  Description sounds  more like seizure and less like stroke.  Patient reports he is back to baseline.  Head CT personally reviewed and shows no acute changes.  Encephalomalacia from old right MCA infarct noted.    Stroke Risk Factors - diabetes mellitus, hyperlipidemia and hypertension  Plan: 1. HgbA1c, fasting lipid panel 2. MRI of the brain without contrast.  Would not pursue stroke work up unless imaging diagnostic of an acute ischemic event.   3. Prophylactic therapy-Continue Plavix 4. Seizure precautions 5. Keppra 500mg  BID-first dose now 6. Frequent neuro checks 7. EEG  Case discussed with Dr. Orvan Falconer, MD [facility] [phone] 09/19/2014, 7:41 PM

## 2014-09-19 NOTE — H&P (Signed)
[facility] History and Physical  Donald [name] PIR:[id number] DOB: 04/29/1946 DOA: 09/19/2014  Referring physician: ED physician PCP: London Pepper, MD  Specialists:   Chief Complaint: left hand jerking  HPI: Donald [name] is a 69 y.o. male with past medical history of stroke (right MCA infarct in January of 2015), hypertension, hyperlipidemia, diabetes mellitus, who presents with left hand jerking.  Patient reports that he had one episode of left hand jerking movement when he was eating dinner at about 6 PM. He states that he had drawing of his fingers on the left hand, then his left hand and arm began to jerk. Symptoms lasted about 15 minutes and resolved spontaneously. EMS was called and the patient was brought in as a code stroke. Patient does not have new unilateral weakness, numbness or tingling sensations. No bleeding or hearing change. No slurred speech.   ROS: currently patient denies fever, chills, fatigue, running nose, ear pain, headaches, cough, chest pain, SOB, abdominal pain, diarrhea, constipation, dysuria, urgency, frequency, hematuria, skin rashes or leg swelling.   In ED, patient was found to have negative CT head for acute abnormalities. Potassium 3.3, and 1.06, negative troponin, temperature 98.5, no tachycardia, WBC 10.7. 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 Patient is admitted to inpatient for further evaluation and treatment.  Review of Systems: As presented in the history of presenting illness, rest negative.  Where does patient live?  At home Can patient participate in ADLs? some  Allergy: No Known Allergies  Past Medical History  Diagnosis Date  . Hypertension   . Diabetes mellitus without complication   . Hyperlipemia   . large right middle cerebral artery infarct, embolic 11/03/158    a. s/p IV tPA, b. source unknown, c. loop recorder placed    Past Surgical History  Procedure Laterality Date  . Tee without cardioversion  N/A 06/07/2013    Procedure: TRANSESOPHAGEAL ECHOCARDIOGRAM (TEE);  Surgeon: [name], MD;  Location: [facility] ENDOSCOPY;  Service: Cardiovascular;  Laterality: N/A;  . Loop recorder implant  06/07/2013    MDT LinQ implanted by Dr [name] for cryptogenic stroke  . Loop recorder implant N/A 06/07/2013    Procedure: LOOP RECORDER IMPLANT;  Surgeon: [name], MD;  Location: [facility] CATH LAB;  Service: Cardiovascular;  Laterality: N/A;    Social History:  reports that he has quit smoking. He has never used smokeless tobacco. He reports that he drinks about 0.6 oz of alcohol per week. He reports that he does not use illicit drugs.  Family History:  Family History  Problem Relation Age of Onset  . Dementia Mother   . Diabetes Mother   . Heart attack Father      Prior to Admission medications   Medication Sig Start Date End Date Taking? Authorizing Provider  amLODipine (NORVASC) 10 MG tablet Take 1 tablet (10 mg total) by mouth daily. 06/30/13  Yes Daniel J Angiulli, PA-C  carvedilol (COREG) 25 MG tablet Take 1 tablet (25 mg total) by mouth 2 (two) times daily with a meal. 06/30/13  Yes Daniel J Angiulli, PA-C  metFORMIN (GLUCOPHAGE-XR) 500 MG 24 hr tablet Take 750 mg by mouth 2 (two) times daily.    Yes Historical Provider, MD  cloNIDine (CATAPRES) 0.3 MG tablet Take 0.3 mg by mouth 2 (two) times daily. 06/30/13   Lavon Paganini Angiulli, PA-C  clopidogrel (PLAVIX) 75 MG tablet Take 1 tablet (75 mg total) by mouth daily with breakfast. 06/30/13   Cathlyn Parsons,  PA-C  hydrALAZINE (APRESOLINE) 10 MG tablet Take 10 mg by mouth daily.  06/30/13   Lavon Paganini Angiulli, PA-C  hydrochlorothiazide (MICROZIDE) 12.5 MG capsule Take 25 mg by mouth daily.  08/08/13   Thompson Grayer, MD  lisinopril (PRINIVIL,ZESTRIL) 40 MG tablet Take 1 tablet (40 mg total) by mouth daily. 06/30/13   Cathlyn Parsons, PA-C    Physical Exam: Filed Vitals:   09/19/14 2145 09/19/14 2339 09/20/14 0140 09/20/14 0349  BP: 154/82 143/73  124/68 125/63  Pulse: 67 63 59 53  Temp: 98.1 F (36.7 C) 97.8 F (36.6 C) 98.2 F (36.8 C) 98.5 F (36.9 C)  TempSrc: Oral Oral Oral Oral  Resp: 20 18 16 16   Height:      Weight:      SpO2: 98% 99% 98% 99%   General: Not in acute distress HEENT:       Eyes: PERRL, EOMI, no scleral icterus       ENT: No discharge from the ears and nose, no pharynx injection, no tonsillar enlargement.        Neck: No JVD, no bruit, no mass felt. Cardiac: S1/S2, RRR, No murmurs, No gallops or rubs Pulm: Good air movement bilaterally. Clear to auscultation bilaterally. No rales, wheezing, rhonchi or rubs. Abd: Soft, nondistended, nontender, no rebound pain, no organomegaly, BS present Ext: No edema bilaterally. 2+DP/PT pulse bilaterally Musculoskeletal: No joint deformities, erythema, or stiffness, ROM full Skin: No rashes.  Neuro: Alert and oriented X3, cranial nerves II-XII grossly intact, muscle strength 5/5 in all extremeties, sensation to light touch intact. Brachial and knee reflexes are more prominent on the left than in the right. Positive Babinski's sign on the left.  Normal finger to nose test. Psych: Patient is not psychotic, no suicidal or hemocidal ideation.  Labs on Admission:  Basic Metabolic Panel:  Recent Labs Lab 09/19/14 1856 09/19/14 1908  NA 136 137  K 3.3* 3.3*  CL 98 99  CO2 20  --   GLUCOSE 128* 131*  BUN 14 18  CREATININE 1.14 1.00  CALCIUM 9.1  --    Liver Function Tests:  Recent Labs Lab 09/19/14 1856  AST 24  ALT 13  ALKPHOS 58  BILITOT 1.1  PROT 7.0  ALBUMIN 3.9   No results for input(s): LIPASE, AMYLASE in the last 168 hours. No results for input(s): AMMONIA in the last 168 hours. CBC:  Recent Labs Lab 09/19/14 1856 09/19/14 1908  WBC 10.7*  --   NEUTROABS 5.3  --   HGB 14.7 13.9  HCT 39.8 41.0  MCV 92.3  --   PLT 216  --    Cardiac Enzymes: No results for input(s): CKTOTAL, CKMB, CKMBINDEX, TROPONINI in the last 168 hours.  BNP (last  3 results) No results for input(s): BNP in the last 8760 hours.  ProBNP (last 3 results) No results for input(s): PROBNP in the last 8760 hours.  CBG:  Recent Labs Lab 09/19/14 1918 09/19/14 2347  GLUCAP 144* 214*    Radiological Exams on Admission: Ct Head (brain) Wo Contrast  09/19/2014   CLINICAL DATA:  Code stroke. Prior history of large right MCA territory infarct.  EXAM: CT HEAD WITHOUT CONTRAST  TECHNIQUE: Contiguous axial images were obtained from the base of the skull through the vertex without intravenous contrast.  COMPARISON:  06/03/2013  FINDINGS: There is no evidence of mass effect, midline shift, or extra-axial fluid collections. There is no evidence of a space-occupying lesion or intracranial hemorrhage. There is  no evidence of a cortical-based area of acute infarction. There is an old right MCA territory infarct with encephalomalacia.  The ventricles and sulci are appropriate for the patient's age. The basal cisterns are patent.  Visualized portions of the orbits are unremarkable. The mastoid sinuses are clear. There is a left maxillary sinus mucous retention cyst.  The osseous structures are unremarkable.  IMPRESSION: 1. No acute intracranial pathology. 2. Old right MCA territory infarct with encephalomalacia. These results were called by telephone at the time of interpretation on 09/19/2014 at 7:11 pm to Dr. Doy Mince, who verbally acknowledged these results.   Electronically Signed   By: Kathreen Devoid   On: 09/19/2014 19:17    EKG: Independently reviewed.  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 Assessment/Plan Principal Problem:   Jerking Active Problems:   large right middle cerebral artery infarct, embolic   Hypertension   CVA (cerebral infarction)   HTN (hypertension)   HLD (hyperlipidemia)   Diabetes mellitus without complication   Hypokalemia  Left hand jerking: Etiology is not clear. Neurology was consulted. Dr. Doy Mince  evaluated the patient, and thought that it is more like seizure and less like stroke.  Patient reports he is back to baseline.  Head CT -head is negative  -will admit to tele bed. -Appreciate neurology's consultation, will follow up recommendations as follows. 1. HgbA1c, fasting lipid panel 2. MRI of the brain without contrast.  Would not pursue stroke work up unless imaging diagnostic of an acute ischemic event.    3. Prophylactic therapy-Continue Plavix 4. Seizure precautions 5. Keppra 500mg  BID-first dose now 6. Frequent neuro checks 7. EEG  Hx of stroke:  -continue plavix  Hypertension: -Continue lisinopril, hydrochlorothiazide, hydralazine, amlodipine, clonidine, Coreg.  Diabetes mellitus: A1c 6.2 on 06/03/13. Patient is taking metformin at home. -Sliding-scale insulin -Follow-up A1c  Hypokalemia: -Repleted.  Hyperlipidemia: LDL was 84 on 06/03/13. Patient is not on any medications at home.  -follow-up FLP    DVT ppx: SQ Heparin     Code Status: Full code Family Communication:  Yes, patient's  Wife and daughter   at bed side Disposition Plan: Admit to inpatient   Date of Service 09/20/2014    Ivor Costa [facility] Pager [phone]  If 7PM-7AM, please contact night-coverage [email or web address] Password [email or web address] 09/20/2014, 5:15 AM

## 2014-09-19 NOTE — ED Notes (Signed)
Pt. Arrived via [place] ambulance, as a code stroke .  Pt. Is alert and oriented X4, GCS 15.  Paramedics report that pt. Was at dinner and his lt. Hand became numb also his wife stated that his lt. Side was weak, and lt. Facial droop.  LSN  Was 18:00.    Upon arrival to ED, symptoms have resolved.   Pt. Denies any pain or discomfort.  Airway patent.  Pt. Transferred to CT scanner 2. Pt. Was cleared by Dr. Roxanne Mins.

## 2014-09-19 NOTE — ED Provider Notes (Signed)
CSN: [id number]     Arrival date & time 09/19/14  76 History   First MD Initiated Contact with Patient 09/19/14 1858     Chief Complaint  Patient presents with  . Code Stroke    An emergency department physician performed an initial assessment on this suspected stroke patient at 69. (Consider location/radiation/quality/duration/timing/severity/associated sxs/prior Treatment) The history is provided by the patient and the spouse.  69 year old male came in by ambulance as a code stroke. He was eating and noticed that his left arm started shaking following that, his left arm and leg were weaker than they had been. His left arm and leg have been weak following a stroke approximately 18 months ago. He denies any headache or nausea or vomiting.  Past Medical History  Diagnosis Date  . Hypertension   . Diabetes mellitus without complication   . Hyperlipemia   . large right middle cerebral artery infarct, embolic 09/05/5033    a. s/p IV tPA, b. source unknown, c. loop recorder placed   Past Surgical History  Procedure Laterality Date  . Tee without cardioversion N/A 06/07/2013    Procedure: TRANSESOPHAGEAL ECHOCARDIOGRAM (TEE);  Surgeon: [name], MD;  Location: [facility] ENDOSCOPY;  Service: Cardiovascular;  Laterality: N/A;  . Loop recorder implant  06/07/2013    MDT LinQ implanted by Dr [name] for cryptogenic stroke  . Loop recorder implant N/A 06/07/2013    Procedure: LOOP RECORDER IMPLANT;  Surgeon: [name], MD;  Location: [place] CATH LAB;  Service: Cardiovascular;  Laterality: N/A;   Family History  Problem Relation Age of Onset  . Dementia Mother   . Diabetes Mother   . Heart attack Father    History  Substance Use Topics  . Smoking status: Former Research scientist (life sciences)  . Smokeless tobacco: Never Used     Comment: Quit 25 years ago.  . Alcohol Use: 0.6 oz/week    1 Glasses of wine per week     Comment: Rare    Review of Systems  All other systems reviewed and are  negative.     Allergies  Review of patient's allergies indicates no known allergies.  Home Medications   Prior to Admission medications   Medication Sig Start Date End Date Taking? Authorizing Provider  amLODipine (NORVASC) 10 MG tablet Take 1 tablet (10 mg total) by mouth daily. 06/30/13   Lavon Paganini Angiulli, PA-C  carvedilol (COREG) 25 MG tablet Take 1 tablet (25 mg total) by mouth 2 (two) times daily with a meal. 06/30/13   Lavon Paganini Angiulli, PA-C  cloNIDine (CATAPRES) 0.3 MG tablet Take 0.3 mg by mouth 2 (two) times daily. 06/30/13   Lavon Paganini Angiulli, PA-C  clopidogrel (PLAVIX) 75 MG tablet Take 1 tablet (75 mg total) by mouth daily with breakfast. 06/30/13   Lavon Paganini Angiulli, PA-C  hydrALAZINE (APRESOLINE) 10 MG tablet Take 10 mg by mouth once as needed.  06/30/13   Lavon Paganini Angiulli, PA-C  hydrochlorothiazide (MICROZIDE) 12.5 MG capsule Take 25 mg by mouth daily.  08/08/13   Thompson Grayer, MD  lisinopril (PRINIVIL,ZESTRIL) 40 MG tablet Take 1 tablet (40 mg total) by mouth daily. 06/30/13   Lavon Paganini Angiulli, PA-C  metFORMIN (GLUCOPHAGE-XR) 500 MG 24 hr tablet Take 750 mg by mouth 2 (two) times daily.     Historical Provider, MD   BP 164/99 mmHg  Pulse 91  Temp(Src) 98.5 F (36.9 C) (Oral)  Resp 20  Ht 6' 3.5" (1.918 m)  Wt 219 lb (  99.338 kg)  BMI 27.00 kg/m2  SpO2 100% Physical Exam  Nursing note and vitals reviewed.  69 year old male, resting comfortably and in no acute distress. Vital signs are significant for hypertension. Oxygen saturation is 100%, which is normal. Head is normocephalic and atraumatic. PERRLA, EOMI. Oropharynx is clear. Neck is nontender and supple without adenopathy or JVD. There are no carotid bruits. Back is nontender and there is no CVA tenderness. Lungs are clear without rales, wheezes, or rhonchi. Chest is nontender. Heart has regular rate and rhythm without murmur. Abdomen is soft, flat, nontender without masses or hepatosplenomegaly and peristalsis  is normoactive. Extremities: 2+ pitting edema which is asymmetric. The left calf is approximately 3 cm greater circumference than the right calf. Patient states that this is chronic. There are also moderate venous stasis changes present. Skin is warm and dry without rash. Neurologic: Mental status is normal, cranial nerves are intact. There is moderate left hemiparesis. At no Babinski reflex is seen.  ED Course  Procedures (including critical care time) Labs Review Labs Reviewed  CBC - Abnormal; Notable for the following:    WBC 10.7 (*)    MCH 34.1 (*)    MCHC 36.9 (*)    All other components within normal limits  DIFFERENTIAL - Abnormal; Notable for the following:    Lymphs Abs 4.3 (*)    All other components within normal limits  CBG MONITORING, ED - Abnormal; Notable for the following:    Glucose-Capillary 144 (*)    All other components within normal limits  I-STAT CHEM 8, ED - Abnormal; Notable for the following:    Potassium 3.3 (*)    Glucose, Bld 131 (*)    Calcium, Ion 1.03 (*)    All other components within normal limits  PROTIME-INR  APTT  COMPREHENSIVE METABOLIC PANEL  I-STAT TROPOININ, ED    Imaging Review Ct Head (brain) Wo Contrast  09/19/2014   CLINICAL DATA:  Code stroke. Prior history of large right MCA territory infarct.  EXAM: CT HEAD WITHOUT CONTRAST  TECHNIQUE: Contiguous axial images were obtained from the base of the skull through the vertex without intravenous contrast.  COMPARISON:  06/03/2013  FINDINGS: There is no evidence of mass effect, midline shift, or extra-axial fluid collections. There is no evidence of a space-occupying lesion or intracranial hemorrhage. There is no evidence of a cortical-based area of acute infarction. There is an old right MCA territory infarct with encephalomalacia.  The ventricles and sulci are appropriate for the patient's age. The basal cisterns are patent.  Visualized portions of the orbits are unremarkable. The mastoid  sinuses are clear. There is a left maxillary sinus mucous retention cyst.  The osseous structures are unremarkable.  IMPRESSION: 1. No acute intracranial pathology. 2. Old right MCA territory infarct with encephalomalacia. These results were called by telephone at the time of interpretation on 09/19/2014 at 7:11 pm to Dr. Doy Mince, who verbally acknowledged these results.   Electronically Signed   By: Kathreen Devoid   On: 09/19/2014 19:17     EKG Interpretation   Date/Time:  Tuesday September 19 2014 19:10:06 EDT Ventricular Rate:  81 PR Interval:  244 QRS Duration: 98 QT Interval:  403 QTC Calculation: 468 R Axis:   -76 Text Interpretation:  Sinus or ectopic atrial rhythm Ventricular premature  complex Prolonged PR interval Left anterior fascicular block Abnormal  R-wave progression, late transition When compared with ECG of 06/03/2013,  Nonspecific T wave abnormality is no longer Present Confirmed  by Roxanne Mins   MD, Arshad Oberholzer ([id number]) on 09/19/2014 7:39:23 PM      MDM   Final diagnoses:  Focal seizure    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 Patient is seen in conjunction with Dr. Doy Mince of neurology service. Head CT is unremarkable. After return from CT, his strength is back to normal for him. If feeling was that weakness was related to focal seizure. Dr. Doy Mince is recommended hospitalization for repeat MRI and MRA and consideration for starting on anticonvulsant therapy.  Cases discussed with Dr. Blaine Hamper of [facility] who agrees to admit the patient.   Delora Fuel, MD 76/22/63 [id number]

## 2014-09-19 NOTE — Progress Notes (Signed)
Patient arrived to 4N20 from ED. Alert and oriented x 4. Telemetry applied. Will continue to monitor patient closely. Burnell Blanks, RN

## 2014-09-20 ENCOUNTER — Inpatient Hospital Stay (HOSPITAL_COMMUNITY): Payer: Medicare Other

## 2014-09-20 DIAGNOSIS — R253 Fasciculation: Secondary | ICD-10-CM

## 2014-09-20 DIAGNOSIS — E876 Hypokalemia: Secondary | ICD-10-CM | POA: Diagnosis present

## 2014-09-20 LAB — GLUCOSE, CAPILLARY: Glucose-Capillary: 153 mg/dL — ABNORMAL HIGH (ref 70–99)

## 2014-09-20 LAB — LIPID PANEL
Cholesterol: 139 mg/dL (ref 0–200)
HDL: 28 mg/dL — ABNORMAL LOW (ref >39)
LDL Cholesterol: 68 mg/dL (ref 0–99)
Total CHOL/HDL Ratio: 5 RATIO
Triglycerides: 214 mg/dL — ABNORMAL HIGH (ref <150)
VLDL: 43 mg/dL — ABNORMAL HIGH (ref 0–40)

## 2014-09-20 MED ORDER — LEVETIRACETAM 500 MG PO TABS: 500.0000 mg | ORAL_TABLET | Freq: Two times a day (BID) | ORAL | Status: DC

## 2014-09-20 NOTE — Progress Notes (Signed)
OT Cancellation Note  Patient Details Name: Donald [name] MRN: [id number] DOB: [birth year]   Cancelled Treatment:    Reason Eval/Treat Not Completed: Medical issues which prohibited therapy;Other (comment) (Pt has an active bed rest order.) Will reattempt once pt no longer on bed rest as schedule permits.  Hortencia Pilar 09/20/2014, 8:52 AM

## 2014-09-20 NOTE — Progress Notes (Signed)
SLP Cancellation Note  Patient Details Name: Donald [name] MRN: [id number] DOB: [birth year]   Cancelled treatment:        Pt currently gone for MRI.  Spoke with wife and daughter, who reports speech/language/cognition are all at baseline.  MD and PT notes confer that there are no communication or cognitive issues.  Will defer this eval at this time.     Quinn Axe T 09/20/2014, 12:45 PM

## 2014-09-20 NOTE — Discharge Summary (Signed)
Physician Discharge Summary  Donald [name] WNU:[id number] DOB: Aug 20, 1945 DOA: 09/19/2014  PCP: London Pepper, MD  Admit date: 09/19/2014 Discharge date: 09/20/2014  Time spent: 25 minutes  Recommendations for Outpatient Follow-up:  1. Suspected to have complex partial seizures, started on keppra.   Discharge Diagnoses:  Principal Problem:   Jerking Active Problems:   large right middle cerebral artery infarct, embolic   Hypertension   CVA (cerebral infarction)   HTN (hypertension)   HLD (hyperlipidemia)   Diabetes mellitus without complication   Hypokalemia   Discharge Condition: Stable  Diet recommendation: Carb modified, heart healthy  Filed Weights   09/19/14 1914  Weight: 99.338 kg (219 lb)    History of present illness:  Donald [name] is a 69yo M with a PMH of R MCA infarct in 1/15, HTN, HLD and DM who presented to the ED with sudden onset of L hand and arm jerking on 4/19. His symptoms lasted approximately 15 minutes and resolved on their own. He has never had anything like this previously and has no hx of seizures. He denied any unilateral weakness, numbness, or tingling. No apparent deficits from the incident.   Hospital Course:  Suspected complex partial seizure: In ED: CT head was negative for acute abnormalities. K+ was 3.3 and glucose was 144. Pt was admitted for further stroke vs seizure workup.  MRI brain was negative for acute findings. Lipid panel showed LDL of 68. Pt was stable throughout his admission with no apparent further seizure like activity. EEG was negative for abnormalities. Neurology was consulted and started Keppra 500mg  BID. OT consulted and signed off, family states pt's speech is at baseline.  Neurology has no further recommendation, and has cleared the patient for discharge. Pt will be seen in the office by his neurologist, Dr. Leonie Man, on Monday 4/25 to follow up on his admission and apparent complex partial seizure.   History of  CVA: -MRI brain and negative for acute abnormalities. Continue Plavix  Hypertension: -Moderate control, continue usual antihypertensives on discharge  Type 2 diabetes:  Continue metformin on discharge  Patient's other medical problems were stable during this short hospital stay   Procedures:  EEG normal  Consultations:  Neurology, OT, PT  Discharge Exam: Filed Vitals:   09/20/14 1350  BP: 156/87  Pulse: 51  Temp: 97.9 F (36.6 C)  Resp: 16    General: A&Ox4. NAD, non-toxic appearance Cardiovascular: RRR. No m/r/g Respiratory: CTAB. No wheezes, rales, rhonchi.  Discharge Instructions   Discharge Instructions    Diet - low sodium heart healthy    Complete by:  As directed      Diet Carb Modified    Complete by:  As directed      Driving Restrictions    Complete by:  As directed   No driving, no participating in high speed water sports, no activities at extreme heights.     Increase activity slowly    Complete by:  As directed           Current Discharge Medication List    START taking these medications   Details  levETIRAcetam (KEPPRA) 500 MG tablet Take 1 tablet (500 mg total) by mouth 2 (two) times daily. Qty: 90 tablet, Refills: 0      CONTINUE these medications which have NOT CHANGED   Details  acetaminophen (TYLENOL) 325 MG tablet Take 325 mg by mouth every 6 (six) hours as needed for mild pain.    amLODipine (NORVASC) 10 MG tablet Take 1 tablet (  10 mg total) by mouth daily. Qty: 30 tablet, Refills: 1    carvedilol (COREG) 25 MG tablet Take 1 tablet (25 mg total) by mouth 2 (two) times daily with a meal. Qty: 60 tablet, Refills: 1    cloNIDine (CATAPRES) 0.3 MG tablet Take 0.3 mg by mouth 2 (two) times daily.    clopidogrel (PLAVIX) 75 MG tablet Take 1 tablet (75 mg total) by mouth daily with breakfast. Qty: 30 tablet, Refills: 1    hydrALAZINE (APRESOLINE) 10 MG tablet Take 10 mg by mouth daily.     hydrochlorothiazide (MICROZIDE) 12.5 MG  capsule Take 25 mg by mouth daily.     lisinopril (PRINIVIL,ZESTRIL) 40 MG tablet Take 1 tablet (40 mg total) by mouth daily. Qty: 30 tablet, Refills: 1    metFORMIN (GLUCOPHAGE-XR) 500 MG 24 hr tablet Take 750 mg by mouth 2 (two) times daily.     potassium chloride SA (K-DUR,KLOR-CON) 20 MEQ tablet Take 20 mEq by mouth 2 (two) times daily.       No Known Allergies Follow-up Information    Follow up with London Pepper, MD. Schedule an appointment as soon as possible for a visit in 1 week.   Specialty:  Family Medicine   Contact information:   [address] [place] [address] [phone]       Follow up with Antony Contras, MD On 09/25/2014.   Specialties:  Neurology, Radiology   Why:  APPOINTMENT AT 4 PM   Contact information:   [address] [place] [address] [phone]        The results of significant diagnostics from this hospitalization (including imaging, microbiology, ancillary and laboratory) are listed below for reference.    Significant Diagnostic Studies: Ct Head (brain) Wo Contrast  09/19/2014   CLINICAL DATA:  Code stroke. Prior history of large right MCA territory infarct.  EXAM: CT HEAD WITHOUT CONTRAST  TECHNIQUE: Contiguous axial images were obtained from the base of the skull through the vertex without intravenous contrast.  COMPARISON:  06/03/2013  FINDINGS: There is no evidence of mass effect, midline shift, or extra-axial fluid collections. There is no evidence of a space-occupying lesion or intracranial hemorrhage. There is no evidence of a cortical-based area of acute infarction. There is an old right MCA territory infarct with encephalomalacia.  The ventricles and sulci are appropriate for the patient's age. The basal cisterns are patent.  Visualized portions of the orbits are unremarkable. The mastoid sinuses are clear. There is a left maxillary sinus mucous retention cyst.  The osseous structures are unremarkable.   IMPRESSION: 1. No acute intracranial pathology. 2. Old right MCA territory infarct with encephalomalacia. These results were called by telephone at the time of interpretation on 09/19/2014 at 7:11 pm to Dr. Doy Mince, who verbally acknowledged these results.   Electronically Signed   By: Kathreen Devoid   On: 09/19/2014 19:17   Mr Brain Wo Contrast  09/20/2014   CLINICAL DATA:  Left-sided spasticity lasting 15 min. Prior right MCA stroke in 06/2013. Normal EEG.  EXAM: MRI HEAD WITHOUT CONTRAST  TECHNIQUE: Multiplanar, multiecho pulse sequences of the brain and surrounding structures were obtained without intravenous contrast.  COMPARISON:  Head CT 09/1914 and MRI 06/03/2013  FINDINGS: There is a chronic, moderately large right MCA territory infarct involving right temporal and parietal lobes greater frontal occipital lobes. Small amount chronic blood products are present associated with the infarct. There is mild T2 shine through on diffusion-weighted images at the  margins of the infarct.  Dedicated thin section imaging was performed through the mesial temporal lobes. Hippocampi appear symmetric in size. Subtle T2 hyperintensity is questioned diffusely in the left hippocampus as well as left temporal lobe cortex elsewhere.  There is no evidence of acute infarct, mass, midline shift, or extra-axial fluid collection. There is mild ex vacuo dilatation of the right lateral ventricle. Small, scattered foci T2 hyperintensity in the cerebral white matter bilaterally separate from the area of infarct are similar to the prior MRI and nonspecific but compatible with mild chronic small vessel ischemic disease. There is mild global cerebral atrophy.  Orbits are unremarkable. Small left maxillary sinus mucous retention cyst is noted. No significant mastoid air cell fluid is seen. Major intracranial vascular flow voids are preserved, with some attenuation of right MCA branch vessel flow voids in the area of chronic infarct.  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  IMPRESSION: 1. No acute infarct. 2. Chronic right MCA territory infarct. 3. Subtle T2 hyperintensity in the left hippocampus and left temporal lobe cortex versus artifact. Recent seizure activity is a consideration.   Electronically Signed   By: Logan Bores   On: 09/20/2014 13:14    Microbiology: No results found for this or any previous visit (from the past 240 hour(s)).   Labs: Basic Metabolic Panel:  Recent Labs Lab 09/19/14 1856 09/19/14 1908  NA 136 137  K 3.3* 3.3*  CL 98 99  CO2 20  --   GLUCOSE 128* 131*  BUN 14 18  CREATININE 1.14 1.00  CALCIUM 9.1  --    Liver Function Tests:  Recent Labs Lab 09/19/14 1856  AST 24  ALT 13  ALKPHOS 58  BILITOT 1.1  PROT 7.0  ALBUMIN 3.9   No results for input(s): LIPASE, AMYLASE in the last 168 hours. No results for input(s): AMMONIA in the last 168 hours. CBC:  Recent Labs Lab 09/19/14 1856 09/19/14 1908  WBC 10.7*  --   NEUTROABS 5.3  --   HGB 14.7 13.9  HCT 39.8 41.0  MCV 92.3  --   PLT 216  --    Cardiac Enzymes: No results for input(s): CKTOTAL, CKMB, CKMBINDEX, TROPONINI in the last 168 hours. BNP: BNP (last 3 results) No results for input(s): BNP in the last 8760 hours.  ProBNP (last 3 results) No results for input(s): PROBNP in the last 8760 hours.  CBG:  Recent Labs Lab 09/19/14 1918 09/19/14 2347 09/20/14 0634  GLUCAP 144* 214* 153*    Signed:  Damaris Hippo PA-S, Oren Binet, MD  [facility] 09/20/2014, 2:15 PM  Attending Patient was seen, examined,treatment plan was discussed with PA-S  I have directly reviewed the clinical findings, lab, imaging studies and management of this patient in detail. I have made the necessary changes to the above noted documentation, and agree with the documentation.   Patient admitted for suspected complex partial seizure, seen by neurology and started on Keppra. MRI brain negative for acute  abnormalities, EEG negative for seizure foci. This M.D. spoke with Dr. Janann Colonel over the phone, case was discussed, no further recommendations at this time. Outpatient appointment with patient's primary neurologist-Dr. [name]-set up for this coming Monday.  Patient counseled extensively not to drive, participate in activities at height or engage in high-speed water sports.  Stable for discharge.  Nena Alexander MD [facility].

## 2014-09-20 NOTE — Progress Notes (Signed)
Awaiting patient's return to administer his morning medicines (patient in EEG then MRI)

## 2014-09-20 NOTE — Progress Notes (Signed)
EEG completed, results pending. 

## 2014-09-20 NOTE — Progress Notes (Signed)
CARE MANAGEMENT NOTE 09/20/2014  Patient:  Donald [name],Donald [name]   Account Number:  [id number]  Date Initiated:  09/20/2014  Documentation initiated by:  Olga Coaster  Subjective/Objective Assessment:   ADMITTED WITH SEIZURE VS STROKE     Action/Plan:   CM FOLLOWING FOR DCP   Anticipated DC Date:  09/23/2014   Anticipated DC Plan:  AWAITING FOR PT/OT EVALS FOR DISPOSITION NEEDS    DC Planning Services  CM consult         Status of service:  In process, will continue to follow  Per UR Regulation:  Reviewed for med. necessity/level of care/duration of stay  Comments:  4/20/2016Mindi Slicker RN,BSN,MHA [phone]

## 2014-09-20 NOTE — Evaluation (Signed)
Physical Therapy Evaluation Patient Details Name: Donald [name] MRN: [id number] DOB: [birth year] Today's Date: 09/20/2014   History of Present Illness  69 YO male with old right MCA infarct on Plavix presenting with left UE Jerking and increased left facial droop. High suspicion for seizure versus stroke.  Clinical Impression  Patient very well known to this therapist from admission in 06/2013. Patient demonstrates deficits in functional mobility as indicated below. Will benefit from continued skilled PT acutely to address deficits and maximize function. Will see as indicated and progress as tolerated.     Follow Up Recommendations Supervision - Intermittent    Equipment Recommendations  None recommended by PT    Recommendations for Other Services       Precautions / Restrictions Restrictions Weight Bearing Restrictions: No      Mobility  Bed Mobility Overal bed mobility: Modified Independent             General bed mobility comments: no physical assist required  Transfers Overall transfer level: Needs assistance Equipment used: None Transfers: Sit to/from Stand Sit to Stand: Supervision         General transfer comment: Supervision for stability upon initial standing, patient required increased time to come to EOB and initiae stand, good functional trunk positioning during power up  Ambulation/Gait Ambulation/Gait assistance: Min guard;Supervision Ambulation Distance (Feet): 140 Feet Assistive device: None Gait Pattern/deviations: Step-through pattern;Decreased stride length;Decreased dorsiflexion - left;Ataxic Gait velocity: decreased Gait velocity interpretation: Below normal speed for age/gender General Gait Details: Patient with known gait deviaitons from previous stroke, reports that this is near baseline (ambulation typically slightly better with shoes on).  Stairs            Wheelchair Mobility    Modified Rankin (Stroke Patients  Only) Modified Rankin (Stroke Patients Only) Pre-Morbid Rankin Score: Moderate disability Modified Rankin: Moderately severe disability     Balance                                             Pertinent Vitals/Pain Pain Assessment: No/denies pain    Home Living Family/patient expects to be discharged to:: Private residence Living Arrangements: Spouse/significant other Available Help at Discharge: Family Type of Home: House Home Access: Stairs to enter Entrance Stairs-Rails: None Entrance Stairs-Number of Steps: 3   Home Equipment: Cane - single point;Bedside commode;Shower seat      Prior Function Level of Independence: Independent with assistive device(s)               Hand Dominance   Dominant Hand: Right    Extremity/Trunk Assessment   Upper Extremity Assessment: Defer to OT evaluation           Lower Extremity Assessment:  (hx of deficits in coordination & sensation from prior CVA )         Communication   Communication: No difficulties  Cognition Arousal/Alertness: Awake/alert Behavior During Therapy: WFL for tasks assessed/performed Overall Cognitive Status: Within Functional Limits for tasks assessed                      General Comments      Exercises        Assessment/Plan    PT Assessment Patient needs continued PT services  PT Diagnosis Abnormality of gait   PT Problem List Decreased activity tolerance;Decreased balance;Decreased mobility;Decreased coordination;Impaired sensation  PT Treatment Interventions DME  instruction;Gait training;Stair training;Functional mobility training;Therapeutic activities;Therapeutic exercise;Balance training;Patient/family education   PT Goals (Current goals can be found in the Care Plan section) Acute Rehab PT Goals Patient Stated Goal: to go home PT Goal Formulation: With patient/family Time For Goal Achievement: 10/04/14 Potential to Achieve Goals: Good    Frequency  Min 3X/week   Barriers to discharge        Co-evaluation               End of Session   Activity Tolerance: Patient tolerated treatment well Patient left: in bed;Other (comment) (with transport for EEG) Nurse Communication: Mobility status         Time: 8119-1478 PT Time Calculation (min) (ACUTE ONLY): 25 min   Charges:   PT Evaluation $Initial PT Evaluation Tier I: 1 Procedure PT Treatments $Gait Training: 8-22 mins   PT G CodesDuncan Dull Oct 06, 2014, 10:47 AM Alben Deeds, PT DPT  [phone]

## 2014-09-20 NOTE — Procedures (Signed)
History: 69 yo M with transient L sided weakness  Sedation: None  Technique: This is a 17 channel routine scalp EEG performed at the bedside with bipolar and monopolar montages arranged in accordance to the international 10/20 system of electrode placement. One channel was dedicated to EKG recording.    Background: the background consists of intermixed alpha and beta activities. There is a well defined posterior dominant rhythm of 9.5 Hz that attenuates with eye opening. Sleep structures were recorded with a suggestion of attenuation on the right, but not definite by this study.   Photic stimulation: Physiologic driving is not performed  EEG Abnormalities: none  Clinical Interpretation: This normal EEG is recorded in the waking and sleep state. There was no seizure or seizure predisposition recorded on this study.   Roland Rack, MD [facility] [phone]  If 7pm- 7am, please page neurology on call as listed in [place].

## 2014-09-20 NOTE — Discharge Instructions (Signed)
No driving, no participating in high speed water sports, no activities at extreme heights.

## 2014-09-20 NOTE — Progress Notes (Signed)
PT Cancellation Note  Patient Details Name: Donald [name] MRN: [id number] DOB: 05/03/1946   Cancelled Treatment:      Reason Eval/Treat Not Completed: Medical issues which prohibited therapy;Other (comment) (Pt has an active bed rest order.) Will reattempt once pt no longer on bed rest as schedule permits.   Duncan Dull 09/20/2014, 9:30 AM Alben Deeds, PT DPT  [phone]

## 2014-09-20 NOTE — Progress Notes (Addendum)
OT Cancellation Note  Patient Details Name: Donald [name] MRN: [id number] DOB: January 06, 1946   Cancelled Treatment:    Reason Eval/Treat Not Completed: Patient at procedure or test/ unavailable. Pt having EEG done. Will reattempt as schedule permits.   Hortencia Pilar 09/20/2014, 10:41 AM

## 2014-09-20 NOTE — Progress Notes (Signed)
Subjective: No further spells over night.  Feels back to his baseline. No complaints.   Objective: Current vital signs: BP 135/68 mmHg  Pulse 53  Temp(Src) 98 F (36.7 C) (Oral)  Resp 16  Ht 6' 3.5" (1.918 m)  Wt 99.338 kg (219 lb)  BMI 27.00 kg/m2  SpO2 98% Vital signs in last 24 hours: Temp:  [97.8 F (36.6 C)-98.5 F (36.9 C)] 98 F (36.7 C) (04/20 0741) Pulse Rate:  [53-91] 53 (04/20 0741) Resp:  [12-27] 16 (04/20 0741) BP: (124-180)/(63-99) 135/68 mmHg (04/20 0741) SpO2:  [98 %-100 %] 98 % (04/20 0741) Weight:  [99.338 kg (219 lb)] 99.338 kg (219 lb) (04/19 1914)  Intake/Output from previous day:   Intake/Output this shift:   Nutritional status: Diet heart healthy/carb modified Room service appropriate?: Yes; Fluid consistency:: Thin  Neurologic Exam:  Mental Status: Alert, oriented, thought content appropriate.  Speech fluent without evidence of aphasia.  Able to follow 3 step commands without difficulty. Cranial Nerves: II: Discs flat bilaterally; Visual fields grossly normal, pupils equal, round, reactive to light and accommodation III,IV, VI: ptosis not present, extra-ocular motions intact bilaterally V,VII: smile symmetric with left NL fold decrease, facial light touch sensation normal bilaterally VIII: hearing normal bilaterally IX,X: uvula rises symmetrically XI: bilateral shoulder shrug XII: midline tongue extension without atrophy or fasciculations  Motor: Right : Upper extremity   5/5    Left:     Upper extremity   5-/5  Lower extremity   5/5     Lower extremity   5-/5 Tone and bulk:normal tone throughout; no atrophy noted Sensory: Pinprick and light touch intact throughout, bilaterally Deep Tendon Reflexes:  Right: Upper Extremity   Left: Upper extremity   biceps (C-5 to C-6) 2/4   biceps (C-5 to C-6) 2/4 tricep (C7) 2/4    triceps (C7) 2/4 Brachioradialis (C6) 2/4  Brachioradialis (C6) 2/4  Lower Extremity Lower Extremity  quadriceps (L-2 to  L-4) 2/4   quadriceps (L-2 to L-4) 2/4 Achilles (S1) 2/4   Achilles (S1) 2/4  Plantars: Right: downgoing   Left: upgoing Cerebellar: Left sided dysmetria on finger to nose and heel to shin.      Lab Results: Basic Metabolic Panel:  Recent Labs Lab 09/19/14 1856 09/19/14 1908  NA 136 137  K 3.3* 3.3*  CL 98 99  CO2 20  --   GLUCOSE 128* 131*  BUN 14 18  CREATININE 1.14 1.00  CALCIUM 9.1  --     Liver Function Tests:  Recent Labs Lab 09/19/14 1856  AST 24  ALT 13  ALKPHOS 58  BILITOT 1.1  PROT 7.0  ALBUMIN 3.9   No results for input(s): LIPASE, AMYLASE in the last 168 hours. No results for input(s): AMMONIA in the last 168 hours.  CBC:  Recent Labs Lab 09/19/14 1856 09/19/14 1908  WBC 10.7*  --   NEUTROABS 5.3  --   HGB 14.7 13.9  HCT 39.8 41.0  MCV 92.3  --   PLT 216  --     Cardiac Enzymes: No results for input(s): CKTOTAL, CKMB, CKMBINDEX, TROPONINI in the last 168 hours.  Lipid Panel:  Recent Labs Lab 09/20/14 0430  CHOL 139  TRIG 214*  HDL 28*  CHOLHDL 5.0  VLDL 43*  LDLCALC 68    CBG:  Recent Labs Lab 09/19/14 1918 09/19/14 2347 09/20/14 0634  GLUCAP 144* 214* 153*    Microbiology: Results for orders placed or performed during the hospital encounter of 06/03/13  MRSA PCR Screening     Status: None   Collection Time: 06/03/13  6:05 AM  Result Value Ref Range Status   MRSA by PCR NEGATIVE NEGATIVE Final    Comment:        The GeneXpert MRSA Assay (FDA approved for NASAL specimens only), is one component of a comprehensive MRSA colonization surveillance program. It is not intended to diagnose MRSA infection nor to guide or monitor treatment for MRSA infections.    Coagulation Studies:  Recent Labs  09/19/14 1856  LABPROT 13.9  INR 1.06    Imaging: Ct Head (brain) Wo Contrast  09/19/2014   CLINICAL DATA:  Code stroke. Prior history of large right MCA territory infarct.  EXAM: CT HEAD WITHOUT CONTRAST   TECHNIQUE: Contiguous axial images were obtained from the base of the skull through the vertex without intravenous contrast.  COMPARISON:  06/03/2013  FINDINGS: There is no evidence of mass effect, midline shift, or extra-axial fluid collections. There is no evidence of a space-occupying lesion or intracranial hemorrhage. There is no evidence of a cortical-based area of acute infarction. There is an old right MCA territory infarct with encephalomalacia.  The ventricles and sulci are appropriate for the patient's age. The basal cisterns are patent.  Visualized portions of the orbits are unremarkable. The mastoid sinuses are clear. There is a left maxillary sinus mucous retention cyst.  The osseous structures are unremarkable.  IMPRESSION: 1. No acute intracranial pathology. 2. Old right MCA territory infarct with encephalomalacia. These results were called by telephone at the time of interpretation on 09/19/2014 at 7:11 pm to Dr. Doy Mince, who verbally acknowledged these results.   Electronically Signed   By: Kathreen Devoid   On: 09/19/2014 19:17    Medications:  Scheduled: . amLODipine  10 mg Oral Daily  . carvedilol  25 mg Oral BID WC  . cloNIDine  0.3 mg Oral BID  . clopidogrel  75 mg Oral Q breakfast  . heparin  5,000 Units Subcutaneous 3 times per day  . hydrALAZINE  10 mg Oral Daily  . hydrochlorothiazide  25 mg Oral Daily  . insulin aspart  0-9 Units Subcutaneous TID WC  . levETIRAcetam  500 mg Oral BID  . lisinopril  40 mg Oral Daily    Assessment/Plan: 69 YO male with old right MCA infarct on Plavix presenting with left UE Jerking and increased left facial droop. High suspicion for seizure versus stroke.  EEG and MRI pending. LDL 68, A1c Pending.   Recommend: 1) A1c 2) MRI of the brain without contrast. Would not pursue stroke work up unless imaging diagnostic of an acute ischemic event. 3) Prophylactic therapy-Continue Plavix 4) Continue Keppra 500 mg BID 5) EEG 6) Continue frequent   Neuro checks.    Etta Quill PA-C [facility] [phone]  09/20/2014, 9:54 AM   Patient seen and evaluated. Agree with above assessment and plan. History concerning for possible seizure activity. Will check MRI brain and EEG. Continue keppra 500mg  BID. Will follow up.   Jim Like, DO [facility] [phone]  If 7pm- 7am, please page neurology on call as listed in [facility].

## 2014-09-21 DIAGNOSIS — I639 Cerebral infarction, unspecified: Secondary | ICD-10-CM | POA: Diagnosis not present

## 2014-09-21 DIAGNOSIS — Z09 Encounter for follow-up examination after completed treatment for conditions other than malignant neoplasm: Secondary | ICD-10-CM | POA: Diagnosis not present

## 2014-09-21 DIAGNOSIS — R569 Unspecified convulsions: Secondary | ICD-10-CM | POA: Diagnosis not present

## 2014-09-21 DIAGNOSIS — I10 Essential (primary) hypertension: Secondary | ICD-10-CM | POA: Diagnosis not present

## 2014-09-21 DIAGNOSIS — E119 Type 2 diabetes mellitus without complications: Secondary | ICD-10-CM | POA: Diagnosis not present

## 2014-09-21 LAB — HEMOGLOBIN A1C
Hgb A1c MFr Bld: 6.6 % — ABNORMAL HIGH (ref 4.8–5.6)
Mean Plasma Glucose: 143 mg/dL

## 2014-09-25 ENCOUNTER — Ambulatory Visit (INDEPENDENT_AMBULATORY_CARE_PROVIDER_SITE_OTHER): Payer: Medicare Other | Admitting: Neurology

## 2014-09-25 ENCOUNTER — Encounter: Payer: Self-pay | Admitting: Neurology

## 2014-09-25 ENCOUNTER — Ambulatory Visit: Payer: Self-pay | Admitting: Neurology

## 2014-09-25 VITALS — BP 131/80 | HR 57 | Temp 97.1°F | Ht 75.5 in | Wt 218.0 lb

## 2014-09-25 DIAGNOSIS — I639 Cerebral infarction, unspecified: Secondary | ICD-10-CM | POA: Diagnosis not present

## 2014-09-25 DIAGNOSIS — G40109 Localization-related (focal) (partial) symptomatic epilepsy and epileptic syndromes with simple partial seizures, not intractable, without status epilepticus: Secondary | ICD-10-CM | POA: Diagnosis not present

## 2014-09-25 DIAGNOSIS — Z8673 Personal history of transient ischemic attack (TIA), and cerebral infarction without residual deficits: Secondary | ICD-10-CM

## 2014-09-25 NOTE — Patient Instructions (Signed)
Overall you are doing fairly well but I do want to suggest a few things today:   Remember to drink plenty of fluid, eat healthy meals and do not skip any meals. Try to eat protein with a every meal and eat a healthy snack such as fruit or nuts in between meals. Try to keep a regular sleep-wake schedule and try to exercise daily, particularly in the form of walking, 20-30 minutes a day, if you can.   As far as your medications are concerned, I would like to suggest: Continue Keprra  I would like to see you back in 3 months with Dr. Leonie Man, sooner if we need to. Please call us with any interim questions, concerns, problems, updates or refill requests.   Please also call us for any test results so we can go over those with you on the phone.  My clinical assistant and will answer any of your questions and relay your messages to me and also relay most of my messages to you.   Our phone number is [phone]. We also have an after hours call service for urgent matters and there is a physician on-call for urgent questions. For any emergencies you know to call 911 or go to the nearest emergency room

## 2014-09-25 NOTE — Progress Notes (Signed)
[facility]    Provider:  Dr Jaynee Eagles Referring Provider: London Pepper, MD Primary Care Physician:  London Pepper, MD  CC:  seizure  HPI:  Donald [name] is a 69 y.o. male here as a referral from Dr. Orland Mustard for seizure. He follows with Dr. Leonie Man for stroke.   Over a year of therapy and in the hospital, has been doing well post stroke. Was doing fine, they took a walk. At 6pm he felt his hand feel funny, then fingers crossed then arm moving and he couldn't control it. Left hand and arm. It began to hurt, he couldn't control it. His arm was moving. Lasted a few minutes. Called 911, resolved by then. Wife thought she saw something in the face, wife thinks his eyes rolled back momentarily. Not tired or confused afterwards. He doesn't remember his head jerking to the left, wife says things came out his mouth, he spit his food out, he remembers most of it but possibly not all. He was snorting. Wife says his eye can back up. No personal or family history of seizure. He was started on Keppra.   Reviewed notes, labs and imaging from outside physicians, which showed: normal EEG on 09/20/2014.   Personally reviewed images and agree with the following:  FINDINGS: There is a chronic, moderately large right MCA territory infarct involving right temporal and parietal lobes greater frontal occipital lobes. Small amount chronic blood products are present associated with the infarct. There is mild T2 shine through on diffusion-weighted images at the margins of the infarct.  Dedicated thin section imaging was performed through the mesial temporal lobes. Hippocampi appear symmetric in size. Subtle T2 hyperintensity is questioned diffusely in the left hippocampus as well as left temporal lobe cortex elsewhere.  There is no evidence of acute infarct, mass, midline shift, or extra-axial fluid collection. There is mild ex vacuo dilatation of the right lateral ventricle. Small,  scattered foci T2 hyperintensity in the cerebral white matter bilaterally separate from the area of infarct are similar to the prior MRI and nonspecific but compatible with mild chronic small vessel ischemic disease. There is mild global cerebral atrophy.  Orbits are unremarkable. Small left maxillary sinus mucous retention cyst is noted. No significant mastoid air cell fluid is seen. Major intracranial vascular flow voids are preserved, with some attenuation of right MCA branch vessel flow voids in the area of chronic infarct. 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  IMPRESSION: 1. No acute infarct. 2. Chronic right MCA territory infarct. 3. Subtle T2 hyperintensity in the left hippocampus and left temporal lobe cortex versus artifact. Recent seizure activity is a Consideration.  HgbA1c 6.6     Review of Systems: Patient complains of symptoms per HPI as well as the following symptoms: Seizure, ear discharge. Pertinent negatives per HPI. All others negative.   History   Social History  . Marital Status: Married    Spouse Name: [name]  . Number of Children: 2  . Years of Education: College   Occupational History  . Retired    Social History Main Topics  . Smoking status: Former Research scientist (life sciences)  . Smokeless tobacco: Never Used     Comment: Quit 25 years ago.  . Alcohol Use: 0.6 oz/week    1 Glasses of wine per week     Comment: Rare  . Drug Use: No  . Sexual Activity: No   Other Topics Concern  . Not on file   Social History Narrative   Patient lives at  home [place]).   Retired works part time 10 hours a week.   Right handed   Education college   Caffeine: one cup daily coffee   No soda or tea.    Family History  Problem Relation Age of Onset  . Dementia Mother   . Diabetes Mother   . Heart attack Father     Past Medical History  Diagnosis Date  . Hypertension   . Diabetes mellitus without complication   . Hyperlipemia   . large right  middle cerebral artery infarct, embolic 11/05/4401    a. s/p IV tPA, b. source unknown, c. loop recorder placed    Past Surgical History  Procedure Laterality Date  . Tee without cardioversion N/A 06/07/2013    Procedure: TRANSESOPHAGEAL ECHOCARDIOGRAM (TEE);  Surgeon: [name], MD;  Location: [facility] ENDOSCOPY;  Service: Cardiovascular;  Laterality: N/A;  . Loop recorder implant  06/07/2013    MDT LinQ implanted by Dr [name] for cryptogenic stroke  . Loop recorder implant N/A 06/07/2013    Procedure: LOOP RECORDER IMPLANT;  Surgeon: [name], MD;  Location: [facility] CATH LAB;  Service: Cardiovascular;  Laterality: N/A;    Current Outpatient Prescriptions  Medication Sig Dispense Refill  . acetaminophen (TYLENOL) 325 MG tablet Take 325 mg by mouth every 6 (six) hours as needed for mild pain.    Marland Kitchen amLODipine (NORVASC) 10 MG tablet Take 1 tablet (10 mg total) by mouth daily. 30 tablet 1  . carvedilol (COREG) 25 MG tablet Take 1 tablet (25 mg total) by mouth 2 (two) times daily with a meal. 60 tablet 1  . cloNIDine (CATAPRES) 0.3 MG tablet Take 0.3 mg by mouth 2 (two) times daily.    . clopidogrel (PLAVIX) 75 MG tablet Take 1 tablet (75 mg total) by mouth daily with breakfast. 30 tablet 1  . hydrALAZINE (APRESOLINE) 10 MG tablet Take 10 mg by mouth daily.     . hydrochlorothiazide (HYDRODIURIL) 25 MG tablet Take 25 mg by mouth daily.    Marland Kitchen levETIRAcetam (KEPPRA) 500 MG tablet Take 1 tablet (500 mg total) by mouth 2 (two) times daily. 90 tablet 0  . lisinopril (PRINIVIL,ZESTRIL) 40 MG tablet Take 1 tablet (40 mg total) by mouth daily. 30 tablet 1  . metFORMIN (GLUCOPHAGE-XR) 750 MG 24 hr tablet Take 750 mg by mouth daily.    . potassium chloride SA (K-DUR,KLOR-CON) 20 MEQ tablet Take 20 mEq by mouth 3 (three) times daily.      No current facility-administered medications for this visit.    Allergies as of 09/25/2014  . (No Known Allergies)    Vitals: BP 131/80 mmHg  Pulse 57  Temp(Src)  97.1 F (36.2 C)  Ht 6' 3.5" (1.918 m)  Wt 218 lb (98.884 kg)  BMI 26.88 kg/m2 Last Weight:  Wt Readings from Last 1 Encounters:  09/25/14 218 lb (98.884 kg)   Last Height:   Ht Readings from Last 1 Encounters:  09/25/14 6' 3.5" (1.918 m)   Neurologic Exam:  Mental Status: Alert, oriented, thought content appropriate. Speech fluent without evidence of aphasia. Able to follow 3 step commands without difficulty. Cranial Nerves: II: Discs flat bilaterally; Visual fields grossly normal, pupils equal, round, reactive to light and accommodation III,IV, VI: ptosis not present, extra-ocular motions intact bilaterally V,VII: smile symmetric with left NL fold decrease, facial light touch sensation normal bilaterally VIII: hearing normal bilaterally IX,X: uvula rises symmetrically XI: bilateral shoulder shrug XII: midline tongue extension without atrophy or fasciculations  Motor: Left-sided mild hemiparesis  Cerebellar: Left sided dysmetria on finger to nose and heel to shin.         Assessment/Plan:  69 year old male with remote right MCA territory infarct. New onset left partial motor seizure. Patient very upset, thought he was doing better. Can't understand why this happened to him as well. Spoke to patient at length, reassured him, explained seizures. Wife with him. Patient very emotional, understandably.   No driving for 6 months or doing anything that may cause harm should he have another seizure Continue Keppra F/u with Dr. Gara Kroner, MD  [facility] [address] [place], [place] [address]  Phone [phone] Fax [phone]  A total of 60 minutes was spent face-to-face with this patient. Over half this time was spent on counseling patient on the stroke and seizure diagnosis and different diagnostic and therapeutic options available.

## 2014-09-26 ENCOUNTER — Ambulatory Visit: Payer: Self-pay | Admitting: Neurology

## 2014-10-06 ENCOUNTER — Ambulatory Visit (INDEPENDENT_AMBULATORY_CARE_PROVIDER_SITE_OTHER): Payer: Medicare Other | Admitting: *Deleted

## 2014-10-06 DIAGNOSIS — I639 Cerebral infarction, unspecified: Secondary | ICD-10-CM | POA: Diagnosis not present

## 2014-10-06 LAB — CUP PACEART REMOTE DEVICE CHECK
Date Time Interrogation Session: 20160401174132
Zone Setting Detection Interval: 2000 ms
Zone Setting Detection Interval: 3000 ms
Zone Setting Detection Interval: 360 ms

## 2014-10-13 ENCOUNTER — Encounter: Payer: Self-pay | Admitting: Internal Medicine

## 2014-10-13 NOTE — Progress Notes (Signed)
Loop recorder 

## 2014-10-17 ENCOUNTER — Telehealth: Payer: Self-pay | Admitting: Neurology

## 2014-10-17 NOTE — Telephone Encounter (Signed)
I called back to clarify.  Patient says he has been taking Keppra foe about 3 weeks.  He is taking 1st dose between 5 and 6am, and second dose between 5-6pm.  Says due to taking Clonodine in the am as well, he goes back to sleep after taking meds and does not get up again until 12 noon.  States he feels lethargic, has slight dizziness, muscle weakness, dreams quite frequently and feels somewhat sad, not his usual self.  Denied thoughts of harming himself or others.  Questioning if he should continue dose as is, if dose should be adjusted, if he should change med times or if something else is recommended.  Please advise.  Thank you.

## 2014-10-17 NOTE — Telephone Encounter (Signed)
Patient called and stated that he has been experiencing some negative side effects from the Rx. levETIRAcetam (KEPPRA) 500 MG tablet and would like to speak with someone regarding this. Please call and advise.

## 2014-10-18 ENCOUNTER — Other Ambulatory Visit: Payer: Self-pay | Admitting: Neurology

## 2014-10-18 DIAGNOSIS — R569 Unspecified convulsions: Secondary | ICD-10-CM

## 2014-10-18 MED ORDER — LEVETIRACETAM ER 500 MG PO TB24: 1000.0000 mg | ORAL_TABLET | Freq: Every day | ORAL | Status: DC

## 2014-10-18 NOTE — Telephone Encounter (Signed)
Thank you Janett Billow, I spoke with him. Offered him to change meds, he wants to stay with keppra. I will change it to Keppra XR in the evenings and see if it is better tolerated. thanks

## 2014-10-23 ENCOUNTER — Encounter: Payer: Self-pay | Admitting: Physical Medicine & Rehabilitation

## 2014-10-23 ENCOUNTER — Ambulatory Visit (HOSPITAL_BASED_OUTPATIENT_CLINIC_OR_DEPARTMENT_OTHER): Payer: Medicare Other | Admitting: Physical Medicine & Rehabilitation

## 2014-10-23 ENCOUNTER — Encounter: Payer: Medicare Other | Attending: Physical Medicine & Rehabilitation

## 2014-10-23 VITALS — BP 163/96 | HR 60 | Resp 14

## 2014-10-23 DIAGNOSIS — I69898 Other sequelae of other cerebrovascular disease: Secondary | ICD-10-CM | POA: Insufficient documentation

## 2014-10-23 DIAGNOSIS — G8110 Spastic hemiplegia affecting unspecified side: Secondary | ICD-10-CM | POA: Insufficient documentation

## 2014-10-23 DIAGNOSIS — R208 Other disturbances of skin sensation: Secondary | ICD-10-CM | POA: Insufficient documentation

## 2014-10-23 DIAGNOSIS — I639 Cerebral infarction, unspecified: Secondary | ICD-10-CM

## 2014-10-23 DIAGNOSIS — R209 Unspecified disturbances of skin sensation: Secondary | ICD-10-CM

## 2014-10-23 DIAGNOSIS — I69998 Other sequelae following unspecified cerebrovascular disease: Secondary | ICD-10-CM

## 2014-10-23 DIAGNOSIS — Z87891 Personal history of nicotine dependence: Secondary | ICD-10-CM | POA: Diagnosis not present

## 2014-10-23 NOTE — Patient Instructions (Addendum)
Resume walking increase either time or distance by ~10% per week

## 2014-10-23 NOTE — Progress Notes (Signed)
Subjective:    Patient ID: Donald [name], male    DOB: February 28, 1946, 69 y.o.   MRN: [id number] History of right MCA infarct in January 2015  HPI  Complex partial seizure approx 1 month ago, was hospitalized, no evidence of new CVA,  Used cane only outside the house now uses it all the time  Feels weak from [place] Recently switched from [place] to [place] ER has not started taking the [place] [facility] yet area and has had neurology follow-up  Working with PCP to adjust timing  DM or HTN meds  Patient and wife had multiple questions regarding Keppra versus Keppra ER as well as how to convert from 1 medication to the other  Pain Inventory Average Pain 0 Pain Right Now 0 My pain is no pain  In the last 24 hours, has pain interfered with the following? General activity 0 Relation with others 0 Enjoyment of life 0 What TIME of day is your pain at its worst? no pain Sleep (in general) Good  Pain is worse with: no pain Pain improves with: no pain Relief from Meds: no pain  Mobility walk with assistance use a cane how many minutes can you walk? 30 ability to climb steps?  yes do you drive?  no  Function retired I need assistance with the following:  shopping  Neuro/Psych No problems in this area  Prior Studies Any changes since last visit?  no  Physicians involved in your care Any changes since last visit?  no   Family History  Problem Relation Age of Onset  . Dementia Mother   . Diabetes Mother   . Heart attack Father    History   Social History  . Marital Status: Married    Spouse Name: Donald [name]  . Number of Children: 2  . Years of Education: College   Occupational History  . Retired    Social History Main Topics  . Smoking status: Former Research scientist (life sciences)  . Smokeless tobacco: Never Used     Comment: Quit 25 years ago.  . Alcohol Use: 0.6 oz/week    1 Glasses of wine per week     Comment: Rare  . Drug Use: No  . Sexual Activity: No   Other Topics Concern  .  None   Social History Narrative   Patient lives at home [place]).   Retired works part time 10 hours a week.   Right handed   Education college   Caffeine: one cup daily coffee   No soda or tea.   Past Surgical History  Procedure Laterality Date  . Tee without cardioversion N/A 06/07/2013    Procedure: TRANSESOPHAGEAL ECHOCARDIOGRAM (TEE);  Surgeon: [name], MD;  Location: [facility] ENDOSCOPY;  Service: Cardiovascular;  Laterality: N/A;  . Loop recorder implant  06/07/2013    MDT LinQ implanted by Dr [name] for cryptogenic stroke  . Loop recorder implant N/A 06/07/2013    Procedure: LOOP RECORDER IMPLANT;  Surgeon: [name], MD;  Location: [facility] CATH LAB;  Service: Cardiovascular;  Laterality: N/A;   Past Medical History  Diagnosis Date  . Hypertension   . Diabetes mellitus without complication   . Hyperlipemia   . large right middle cerebral artery infarct, embolic 06/10/3788    a. s/p IV tPA, b. source unknown, c. loop recorder placed   BP 163/96 mmHg  Pulse 60  Resp 14  SpO2 98%  Opioid Risk Score:   Fall Risk Score:  `1  Depression screen [facility] 2/9  No flowsheet data found.   Review of Systems  All other systems reviewed and are negative.      Objective:   Physical Exam  Motor strength is 5/5 in the right deltoid, biceps, triceps, grip, hip flexor, knee extensors, ankle dorsal flexor plantar flexion Left upper extremity is 4/5 in the deltoid, biceps, triceps, grip 5/5 in the knee extensor 4/5 in the left ankle dorsiflexor Sensation reduced to pinprick as well as light touch in the left upper extremity, some deficit to pinprick in the right hand but this is inconsistent Note pinprick deficit in the right lower extremity but does have significant decreased sensation to pinprick as well as light touch in the left lower extremity from the thigh down into the leg and ankle area.  Speech without evidence of a aphasia or dysarthria Ambulates with a cane no evidence of  total drag or knee instability Gen. No acute distress Mood and affect mildly irritable no lability or agitation       Assessment & Plan:  1. 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 In the interval time he has had a comp looks partial seizure and has felt overall weaker since that time. He complains of fatigue which may be related to his Keppra. He also may be mildly deconditioned after hospitalization. At this point I do not think he needs any formalized PT or OT. He needs to start back on his walking program and slowly increase either his distance or his time with walking by 10% per week. Over half of the 25 min visit was spent counseling and coordinating care.Answered multiple questions from patient and wife I will see him back on a when necessary basis Follow-up with neurology in regards to seizure disorder post stroke Follow-up with primary M.D. In regards to blood pressure  And diabetes

## 2014-10-26 LAB — CUP PACEART REMOTE DEVICE CHECK
Date Time Interrogation Session: 20160427220224
Zone Setting Detection Interval: 2000 ms
Zone Setting Detection Interval: 3000 ms
Zone Setting Detection Interval: 360 ms

## 2014-11-06 ENCOUNTER — Ambulatory Visit (INDEPENDENT_AMBULATORY_CARE_PROVIDER_SITE_OTHER): Payer: Medicare Other | Admitting: *Deleted

## 2014-11-06 DIAGNOSIS — I639 Cerebral infarction, unspecified: Secondary | ICD-10-CM

## 2014-11-08 NOTE — Progress Notes (Signed)
Loop recorder 

## 2014-11-10 ENCOUNTER — Encounter: Payer: Self-pay | Admitting: Internal Medicine

## 2014-11-16 LAB — CUP PACEART REMOTE DEVICE CHECK
Date Time Interrogation Session: 20160527034221
Zone Setting Detection Interval: 2000 ms
Zone Setting Detection Interval: 3000 ms
Zone Setting Detection Interval: 360 ms

## 2014-11-28 ENCOUNTER — Encounter: Payer: Self-pay | Admitting: Internal Medicine

## 2014-12-06 ENCOUNTER — Ambulatory Visit (INDEPENDENT_AMBULATORY_CARE_PROVIDER_SITE_OTHER): Payer: Medicare Other | Admitting: *Deleted

## 2014-12-06 DIAGNOSIS — I639 Cerebral infarction, unspecified: Secondary | ICD-10-CM

## 2014-12-06 LAB — CUP PACEART REMOTE DEVICE CHECK: Date Time Interrogation Session: 20160706172629

## 2014-12-06 NOTE — Progress Notes (Signed)
Loop recorder 

## 2014-12-13 DIAGNOSIS — Z1211 Encounter for screening for malignant neoplasm of colon: Secondary | ICD-10-CM | POA: Diagnosis not present

## 2014-12-13 DIAGNOSIS — R569 Unspecified convulsions: Secondary | ICD-10-CM | POA: Diagnosis not present

## 2014-12-13 DIAGNOSIS — N529 Male erectile dysfunction, unspecified: Secondary | ICD-10-CM | POA: Diagnosis not present

## 2014-12-13 DIAGNOSIS — I10 Essential (primary) hypertension: Secondary | ICD-10-CM | POA: Diagnosis not present

## 2014-12-13 DIAGNOSIS — I639 Cerebral infarction, unspecified: Secondary | ICD-10-CM | POA: Diagnosis not present

## 2014-12-13 DIAGNOSIS — E119 Type 2 diabetes mellitus without complications: Secondary | ICD-10-CM | POA: Diagnosis not present

## 2014-12-20 ENCOUNTER — Encounter: Payer: Self-pay | Admitting: Cardiology

## 2014-12-20 ENCOUNTER — Encounter: Payer: Self-pay | Admitting: Internal Medicine

## 2015-01-01 DIAGNOSIS — E119 Type 2 diabetes mellitus without complications: Secondary | ICD-10-CM | POA: Diagnosis not present

## 2015-01-01 DIAGNOSIS — R569 Unspecified convulsions: Secondary | ICD-10-CM | POA: Diagnosis not present

## 2015-01-05 ENCOUNTER — Ambulatory Visit (INDEPENDENT_AMBULATORY_CARE_PROVIDER_SITE_OTHER): Payer: Medicare Other | Admitting: *Deleted

## 2015-01-05 DIAGNOSIS — I639 Cerebral infarction, unspecified: Secondary | ICD-10-CM

## 2015-01-05 NOTE — Progress Notes (Signed)
Loop recorder 

## 2015-01-08 ENCOUNTER — Encounter: Payer: Self-pay | Admitting: Adult Health

## 2015-01-08 ENCOUNTER — Ambulatory Visit (INDEPENDENT_AMBULATORY_CARE_PROVIDER_SITE_OTHER): Payer: Medicare Other | Admitting: Adult Health

## 2015-01-08 VITALS — HR 60 | Resp 16 | Ht 75.5 in | Wt 222.0 lb

## 2015-01-08 DIAGNOSIS — R569 Unspecified convulsions: Secondary | ICD-10-CM

## 2015-01-08 DIAGNOSIS — Z8673 Personal history of transient ischemic attack (TIA), and cerebral infarction without residual deficits: Secondary | ICD-10-CM | POA: Diagnosis not present

## 2015-01-08 DIAGNOSIS — I639 Cerebral infarction, unspecified: Secondary | ICD-10-CM

## 2015-01-08 NOTE — Patient Instructions (Signed)
Continue clopidogrel 75 mg orally every day for secondary stroke prevention and maintain strict control of hypertension with blood pressure goal below 130/90, diabetes with hemoglobin A1c goal below 6.5% and lipids with LDL cholesterol goal below 100 mg/dL. Continue with light exercise at home and continue to choose healthy food options. Continue Keppra XR 1000 mg daily.  If your symptoms worsen or you develop new symptoms please let us know.

## 2015-01-08 NOTE — Progress Notes (Signed)
I have read the note, and I agree with the clinical assessment and plan.  Aireal Slater KEITH   

## 2015-01-08 NOTE — Progress Notes (Signed)
PATIENT: Donald [name] DOB: Mar 26, 1946  REASON FOR VISIT: follow up-stroke, seizures HISTORY FROM: patient  HISTORY OF PRESENT ILLNESS: Donald [name] is a 69 year old male with a history of ischemic stroke and seizures. He returns today for follow-up. The patient continues to take Plavix for stroke prevention. The patient's primary care manages his blood pressure, cholesterol and diabetes. Patient states that his blood pressure is slightly elevated today. However he relates this to "white coat syndrome." Patient states that he had a seizure in April at that time he was placed on Keppra XR 1000 mg at bedtime. He states that since then he has not had any seizures. He does not operate a motor vehicle. He is able to complete all ADLs independently. Patient states that after the stroke he has had some numbness in the bottom of the left foot and that affects his balance. He continues to use a cane when ambulating. Denies any recent falls. Denies any new neurological symptoms. He returns today for an evaluation.  HISTORY 07/05/14:Donald [name] is a 69 year old male with a history of stroke on 06/03/13. He returns today for follow-up. He is currently taking Plavix for stroke prevention. His HTN, cholesterol and Diabetes is managed by his PCP. He states that his BP has been controlled. When he checks it at home it is usually 130s/70s. His states that his last hemoglobin A1C was 5.9 % . He continues to take metformin- his dosage has recently been decreased. His cholesterol has been in normal range according to the patient and therefore medication has not been required. Patient will use a cane when he is out in public. He does not use it at home. He feels that his walking has improved. Denies any falls. He notes that his fine motor skills in the left hand has improved. He states that he is now able to cook at home. He does have some sensory changes in the left hand and foot. Denies any new symptoms.    HISTORY 12/20/13 ([name]): is an 69 y.o. male who was visiting here for the holidays on 06/03/2013. When he got up he was normal. As he was returning to bed began to be short of breath. Patient then was noted to have slurred speech by his wife and she noted a facial droop. Patient seemed to be weak on the left a well. EMS was called and the patient was brought in as a code stroke. Initial NIHSS of 8. Patient was given TPA. No intervenable lesion was seen on CTA. He was admitted to the neuro ICU for further evaluation and treatment. CT angio head showed Subtle attenuation of the distal right superior MCA territory branch vessels as compared to the left. No proximal branch occlusion or high-grade flow-limiting stenosis identified within the right middle cerebral artery proximally. Otherwise unremarkable CTA of the head without evidence of occlusion, high-grade stenosis, or aneurysm elsewhere within the brain.  CT angio neck was a normal CTA of the neck without evidence of high-grade stenosis, occlusion, or dissection. 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 MRI of the brain showed an acute large right middle cerebral artery territory infarct. Minimal underlying petechial hemorrhage without lobar hematoma. 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 Minimal additional white matter changes suggest chronic small vessel ischemic disease. Acute on chronic paranasal sinusitis. 2D Echocardiogram with the estimated ejection fraction was in the range of 60% to 65%. He was admitted to [facility] 06/07/2013 through 07/02/2013. He is  completed home physical therapy and occupational therapy and is about to start going to outpatient neuro rehabilitation. He is making good progress with his rehabilitation and is very determined. He and his wife have moved in with her daughter here in [place]. He has established care with Dr. Orland Mustard and is seeing him every  3 weeks for adjustments to his hypertensive medications, as his hypertension has been very difficult to control since his 28s. He thinks he forgot to take his blood pressure medicine the day he had the stroke. His blood pressure is elevated office today at 161/90, he is tolerating Plavix well without any significant bruising or bleeding. He is able to ambulate short distances with a hemiwalker and AFO brace on the left. He is having some spasticity in the left arm with clonus at times. Update 12/20/2013 : He returns for followup after last visit 3 months ago. He states is doing well and has not had recurrent TIA her stroke symptoms. He states his worked hard on his diet and lifestyle changes and has in fact lost weight his hemoglobin A1c had dropped less than 5 hence glyburide was discontinued but the latest one was up to 6.2 metformin dose has been increased. He has lost 13 pounds weight since her last visit. He states his blood pressure usually once in the 130s at home though it was elevated on today's visit at 166/74 but it did come down to 148/8 for after resting. He continues to walk with a foot brace but feels now he can walk further since his has lost weight and is feeling better   REVIEW OF SYSTEMS: Out of a complete 14 system review of symptoms, the patient complains only of the following symptoms, and all other reviewed systems are negative.  Facial swelling, ear pain, depression, nervous/anxious   ALLERGIES: No Known Allergies  HOME MEDICATIONS: Outpatient Prescriptions Prior to Visit  Medication Sig Dispense Refill  . amLODipine (NORVASC) 10 MG tablet Take 1 tablet (10 mg total) by mouth daily. 30 tablet 1  . carvedilol (COREG) 25 MG tablet Take 1 tablet (25 mg total) by mouth 2 (two) times daily with a meal. 60 tablet 1  . cloNIDine (CATAPRES) 0.3 MG tablet Take 0.3 mg by mouth 2 (two) times daily.    . clopidogrel (PLAVIX) 75 MG tablet Take 1 tablet (75 mg total) by mouth daily with  breakfast. 30 tablet 1  . hydrALAZINE (APRESOLINE) 10 MG tablet Take 10 mg by mouth 2 (two) times daily.     . hydrochlorothiazide (HYDRODIURIL) 25 MG tablet Take 25 mg by mouth daily.    Marland Kitchen levETIRAcetam (KEPPRA XR) 500 MG 24 hr tablet Take 2 tablets (1,000 mg total) by mouth at bedtime. 60 tablet 6  . lisinopril (PRINIVIL,ZESTRIL) 40 MG tablet Take 1 tablet (40 mg total) by mouth daily. 30 tablet 1  . metFORMIN (GLUCOPHAGE-XR) 750 MG 24 hr tablet Take 750 mg by mouth 2 (two) times daily.     . potassium chloride SA (K-DUR,KLOR-CON) 20 MEQ tablet Take 20 mEq by mouth 3 (three) times daily.     Marland Kitchen acetaminophen (TYLENOL) 325 MG tablet Take 325 mg by mouth every 6 (six) hours as needed for mild pain.     No facility-administered medications prior to visit.    PAST MEDICAL HISTORY: Past Medical History  Diagnosis Date  . Hypertension   . Diabetes mellitus without complication   . Hyperlipemia   . large right middle cerebral artery infarct, embolic 09/08/7024  a. s/p IV tPA, b. source unknown, c. loop recorder placed    PAST SURGICAL HISTORY: Past Surgical History  Procedure Laterality Date  . Tee without cardioversion N/A 06/07/2013    Procedure: TRANSESOPHAGEAL ECHOCARDIOGRAM (TEE);  Surgeon: [name], MD;  Location: [facility] ENDOSCOPY;  Service: Cardiovascular;  Laterality: N/A;  . Loop recorder implant  06/07/2013    MDT LinQ implanted by Dr [name] for cryptogenic stroke  . Loop recorder implant N/A 06/07/2013    Procedure: LOOP RECORDER IMPLANT;  Surgeon: [name], MD;  Location: [facility] CATH LAB;  Service: Cardiovascular;  Laterality: N/A;    FAMILY HISTORY: Family History  Problem Relation Age of Onset  . Dementia Mother   . Diabetes Mother   . Heart attack Father     SOCIAL HISTORY: History   Social History  . Marital Status: Married    Spouse Name: [name]  . Number of Children: 2  . Years of Education: College   Occupational History  . Retired    Social History  Main Topics  . Smoking status: Former Research scientist (life sciences)  . Smokeless tobacco: Never Used     Comment: Quit 25 years ago.  . Alcohol Use: 0.6 oz/week    1 Glasses of wine per week     Comment: Rare  . Drug Use: No  . Sexual Activity: No   Other Topics Concern  . Not on file   Social History Narrative   Patient lives at home [name]).   Retired works part time 10 hours a week.   Right handed   Education college   Caffeine: one cup daily coffee   No soda or tea.      PHYSICAL EXAM  Filed Vitals:   01/08/15 1535  BP: 152/86  Pulse: 60  Resp: 16  Height: 6' 3.5" (1.918 m)  Weight: 222 lb (100.699 kg)   Body mass index is 27.37 kg/(m^2).  Generalized: Well developed, in no acute distress   Neurological examination  Mentation: Alert oriented to time, place, history taking. Follows all commands speech and language fluent Cranial nerve II-XII: Pupils were equal round reactive to light. Extraocular movements were full, visual field were full on confrontational test. Facial sensation and strength were normal. Uvula tongue midline. Head turning and shoulder shrug  were normal and symmetric. Motor: The motor testing reveals 5 over 5 strength in the right upper and lower extremity. 4 over 5 strength in the left upper and lower extremity. Good symmetric motor tone is noted throughout.  Sensory: Sensory testing is intact to soft touch on all 4 extremities. No evidence of extinction is noted.  Coordination: Cerebellar testing reveals good finger-nose-finger on the right dysmetria noted on the left and good heel-to-shin bilaterally.  Gait and station: Patient uses a cane to ambulate. Tandem gait not attempted. He has a circumduction type gait on the left. Reflexes: Deep tendon reflexes are symmetric and normal bilaterally.   DIAGNOSTIC DATA (LABS, IMAGING, TESTING) - I reviewed patient records, labs, notes, testing and imaging myself where available.  Lab Results  Component Value Date   WBC  10.7* 09/19/2014   HGB 13.9 09/19/2014   HCT 41.0 09/19/2014   MCV 92.3 09/19/2014   PLT 216 09/19/2014      Component Value Date/Time   NA 137 09/19/2014 1908   K 3.3* 09/19/2014 1908   CL 99 09/19/2014 1908   CO2 20 09/19/2014 1856   GLUCOSE 131* 09/19/2014 1908   BUN 18 09/19/2014 1908  CREATININE 1.00 09/19/2014 1908   CALCIUM 9.1 09/19/2014 1856   PROT 7.0 09/19/2014 1856   ALBUMIN 3.9 09/19/2014 1856   AST 24 09/19/2014 1856   ALT 13 09/19/2014 1856   ALKPHOS 58 09/19/2014 1856   BILITOT 1.1 09/19/2014 1856   GFRNONAA 65* 09/19/2014 1856   GFRAA 76* 09/19/2014 1856   Lab Results  Component Value Date   CHOL 139 09/20/2014   HDL 28* 09/20/2014   LDLCALC 68 09/20/2014   TRIG 214* 09/20/2014   CHOLHDL 5.0 09/20/2014   Lab Results  Component Value Date   HGBA1C 6.6* 09/20/2014      ASSESSMENT AND PLAN 69 y.o. year old male  has a past medical history of Hypertension; Diabetes mellitus without complication; Hyperlipemia; and large right middle cerebral artery infarct, embolic (01/02/9936). here with:  1. History of stroke 2. Seizures  Continue clopidogrel 75 mg orally every day for secondary stroke prevention and maintain strict control of hypertension with blood pressure goal below 130/90, diabetes with hemoglobin A1c goal below 6.5% and lipids with LDL cholesterol goal below 100 mg/dL. Continue with light exercise at home and continue to choose healthy food options. Patient should continue Keppra XR 1000 mg at bedtime. If he has any additional seizure events he should let us know. Of course if he has any strokelike symptoms he should call 911 immediately. He will follow-up in 6 months or sooner if needed.       Ward Givens, MSN, NP-C 01/08/2015, 3:45 PM [facility] [address], [place], [place] [address] [phone]  Note: This document was prepared with digital dictation and possible smart phrase technology. Any  transcriptional errors that result from this process are unintentional.

## 2015-01-12 ENCOUNTER — Telehealth: Payer: Self-pay | Admitting: Neurology

## 2015-01-12 DIAGNOSIS — E876 Hypokalemia: Secondary | ICD-10-CM | POA: Diagnosis not present

## 2015-01-12 NOTE — Telephone Encounter (Signed)
I called and left a message on patient's answering machine. It appears he had a stroke in January 2015 and it may be okay to hold Plavix for 3-5 days prior to colonoscopy with a small but acceptable preprocedure risk of TIA/stroke if patient is willing

## 2015-01-12 NOTE — Telephone Encounter (Signed)
Dr. Leonie Man - Patient called the on call for you. He needs to have a colonoscopy. Sounds like they would like to place him under general anesthesia. He would like you to call him back and let him know if you feel it is ok for him to be under general anesthesia given his past medical history of stroke.

## 2015-01-15 NOTE — Telephone Encounter (Signed)
Talk to patient about the colonoscopy procedure. Patient understands the need to stop plavix 3 to 5 days prior and he understands the minimum risk per Dr. Leonie Man.

## 2015-01-15 NOTE — Telephone Encounter (Signed)
As per my knowledge colonoscopy usually is done under conscious sedation which has minimum to no risk of stroke

## 2015-01-18 ENCOUNTER — Encounter: Payer: Self-pay | Admitting: Internal Medicine

## 2015-01-25 ENCOUNTER — Encounter: Payer: Self-pay | Admitting: Cardiology

## 2015-02-01 ENCOUNTER — Encounter: Payer: Self-pay | Admitting: Cardiology

## 2015-02-01 ENCOUNTER — Other Ambulatory Visit: Payer: Self-pay | Admitting: Neurology

## 2015-03-05 ENCOUNTER — Telehealth: Payer: Self-pay | Admitting: *Deleted

## 2015-03-05 DIAGNOSIS — G40909 Epilepsy, unspecified, not intractable, without status epilepticus: Secondary | ICD-10-CM | POA: Diagnosis not present

## 2015-03-05 DIAGNOSIS — I10 Essential (primary) hypertension: Secondary | ICD-10-CM | POA: Diagnosis not present

## 2015-03-05 DIAGNOSIS — R531 Weakness: Secondary | ICD-10-CM | POA: Diagnosis not present

## 2015-03-05 DIAGNOSIS — R002 Palpitations: Secondary | ICD-10-CM | POA: Diagnosis not present

## 2015-03-05 DIAGNOSIS — Z79899 Other long term (current) drug therapy: Secondary | ICD-10-CM | POA: Diagnosis not present

## 2015-03-05 DIAGNOSIS — K5900 Constipation, unspecified: Secondary | ICD-10-CM | POA: Diagnosis not present

## 2015-03-05 DIAGNOSIS — E119 Type 2 diabetes mellitus without complications: Secondary | ICD-10-CM | POA: Diagnosis not present

## 2015-03-05 DIAGNOSIS — I4891 Unspecified atrial fibrillation: Secondary | ICD-10-CM | POA: Diagnosis not present

## 2015-03-05 MED ORDER — APIXABAN 5 MG PO TABS: 5.0000 mg | ORAL_TABLET | Freq: Two times a day (BID) | ORAL | Status: DC

## 2015-03-05 NOTE — Telephone Encounter (Signed)
Dr Deniece Ree spoke with  Dr [name] in regards to patient.  Faxed over EKG showing afib.  Recommended stopping Plavix and start Eliquis 5 mg twice daily and follow up in afib clinic.  Dr Deniece Ree will discuss with patient and call in new rx

## 2015-03-06 ENCOUNTER — Ambulatory Visit (INDEPENDENT_AMBULATORY_CARE_PROVIDER_SITE_OTHER): Payer: Medicare Other | Admitting: *Deleted

## 2015-03-06 ENCOUNTER — Encounter: Payer: Self-pay | Admitting: Internal Medicine

## 2015-03-06 DIAGNOSIS — I639 Cerebral infarction, unspecified: Secondary | ICD-10-CM

## 2015-03-06 NOTE — Progress Notes (Signed)
Loop recorder 

## 2015-03-07 ENCOUNTER — Other Ambulatory Visit: Payer: Self-pay

## 2015-03-07 ENCOUNTER — Ambulatory Visit (HOSPITAL_COMMUNITY)
Admission: RE | Admit: 2015-03-07 | Discharge: 2015-03-07 | Disposition: A | Discharge status: Home / Self Care | Payer: Medicare Other | Source: Ambulatory Visit | Attending: Nurse Practitioner | Admitting: Nurse Practitioner

## 2015-03-07 VITALS — BP 132/82 | HR 59 | Ht 75.0 in | Wt 225.8 lb

## 2015-03-07 DIAGNOSIS — I481 Persistent atrial fibrillation: Secondary | ICD-10-CM

## 2015-03-07 DIAGNOSIS — I4819 Other persistent atrial fibrillation: Secondary | ICD-10-CM

## 2015-03-07 DIAGNOSIS — I10 Essential (primary) hypertension: Secondary | ICD-10-CM | POA: Diagnosis not present

## 2015-03-07 MED ORDER — APIXABAN 5 MG PO TABS: 5.0000 mg | ORAL_TABLET | Freq: Two times a day (BID) | ORAL | Status: DC

## 2015-03-07 NOTE — Patient Instructions (Addendum)
Fax number is [phone]  Parking code 0900

## 2015-03-08 ENCOUNTER — Encounter (HOSPITAL_COMMUNITY): Payer: Self-pay | Admitting: Nurse Practitioner

## 2015-03-08 NOTE — Progress Notes (Signed)
Patient ID: Donald [name], male   DOB: 23-Sep-1945, 69 y.o.   MRN: [id number]    Primary Care Physician: Donald Pepper, MD Referring Physician: Dr. Loney Hering [name] is a 69 y.o. male with a h/o stroke 06/2013 that is referred to afib clinic for new onset afib. He gives a history of feeling weak for around one week and went to his PCP for evaluation and afib was found. Plavix was stopped on Monday and he was started on eliquis 5 mg bid for a chadsvasc score of at least 5. Afib with slow v response in the 50's by EKG. He has been on  carvedilol 25 bid for years for his difficult to treat BP. His CVA did affect his left side of body and with time and therapy, he has regained most of lost function. He did have a IRL implanted at time of stroke. He does not have sleep apnea. Has lost around 60 lbs since his CVA. No alcohol use, minimal caffeine. 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  Today, he denies symptoms of palpitations, chest pain, shortness of breath, orthopnea, PND, lower extremity edema, dizziness, presyncope, syncope, or neurologic sequela. Positive for fatigue. The patient is tolerating medications without difficulties and is otherwise without complaint today.   Past Medical History  Diagnosis Date  . Hypertension   . Diabetes mellitus without complication ([place])   . Hyperlipemia   . large right middle cerebral artery infarct, embolic 12/08/2954    a. s/p IV tPA, b. source unknown, c. loop recorder placed   Past Surgical History  Procedure Laterality Date  . Tee without cardioversion N/A 06/07/2013    Procedure: TRANSESOPHAGEAL ECHOCARDIOGRAM (TEE);  Surgeon: Donald Spark, MD;  Location: [facility] ENDOSCOPY;  Service: Cardiovascular;  Laterality: N/A;  . Loop recorder implant  06/07/2013    MDT LinQ implanted by Dr Donald [name] for cryptogenic stroke  . Loop  recorder implant N/A 06/07/2013    Procedure: LOOP RECORDER IMPLANT;  Surgeon: Donald Mark, MD;  Location: [facility] CATH LAB;  Service: Cardiovascular;  Laterality: N/A;    Current Outpatient Prescriptions  Medication Sig Dispense Refill  . amLODipine (NORVASC) 10 MG tablet Take 1 tablet (10 mg total) by mouth daily. 30 tablet 1  . apixaban (ELIQUIS) 5 MG TABS tablet Take 1 tablet (5 mg total) by mouth 2 (two) times daily. 60 tablet 1  . carvedilol (COREG) 25 MG tablet Take 1 tablet (25 mg total) by mouth 2 (two) times daily with a meal. 60 tablet 1  . cloNIDine (CATAPRES) 0.3 MG tablet Take 0.3 mg by mouth 2 (two) times daily.    . hydrALAZINE (APRESOLINE) 10 MG tablet Take 10 mg by mouth 2 (two) times daily.     . hydrochlorothiazide (HYDRODIURIL) 25 MG tablet Take 25 mg by mouth daily.    Marland Kitchen levETIRAcetam (KEPPRA XR) 500 MG 24 hr tablet Take 2 tablets by mouth at  bedtime 180 tablet 1  . lisinopril (PRINIVIL,ZESTRIL) 40 MG tablet Take 1 tablet (40 mg total) by mouth daily. 30 tablet 1  . metFORMIN (GLUCOPHAGE-XR) 750 MG 24 hr tablet Take 750 mg by mouth 2 (two) times daily.     . potassium chloride SA (K-DUR,KLOR-CON) 20 MEQ tablet Take 20 mEq by mouth 3 (three) times daily.      No current facility-administered medications for this encounter.  Allergies  Allergen Reactions  . Bidil [Isosorb Dinitrate-Hydralazine] Other (See Comments)    Lethargic,tired     Social History   Social History  . Marital Status: Married    Spouse Name: Donald [name]  . Number of Children: 2  . Years of Education: College   Occupational History  . Retired    Social History Main Topics  . Smoking status: Former Research scientist (life sciences)  . Smokeless tobacco: Never Used     Comment: Quit 25 years ago.  . Alcohol Use: 0.6 oz/week    1 Glasses of wine per week     Comment: Rare  . Drug Use: No  . Sexual Activity: No   Other Topics Concern  . Not on file   Social History Narrative   Patient lives at home Donald [name]).    Retired works part time 10 hours a week.   Right handed   Education college   Caffeine: one cup daily coffee   No soda or tea.    Family History  Problem Relation Age of Onset  . Dementia Mother   . Diabetes Mother   . Heart attack Father     ROS- All systems are reviewed and negative except as per the HPI above  Physical Exam: Filed Vitals:   03/07/15 1527  BP: 132/82  Pulse: 59  Height: 6\' 3"  (1.905 m)  Weight: 225 lb 12.8 oz (102.422 kg)    GEN- The patient is well appearing, alert and oriented x 3 today.   Head- normocephalic, atraumatic Eyes-  Sclera clear, conjunctiva pink Ears- hearing intact Oropharynx- clear Neck- supple, no JVP Lymph- no cervical lymphadenopathy Lungs- Clear to ausculation bilaterally, normal work of breathing Heart- Slow irregular rate and rhythm, no murmurs, rubs or gallops, PMI not laterally displaced GI- soft, NT, ND, + BS Extremities- no clubbing, cyanosis, or edema MS- no significant deformity or atrophy Skin- no rash or lesion Psych- euthymic mood, full affect Neuro- strength and sensation are intact  EKG-Afib with slow v response, LAD, QRS int 90 ms, QTc 397 ms, septal infarct, age unknown.  Assessment and Plan: 1. New onset afib, rate controlled Off plavix, on eliquis 5 mg bid Continue carvedilol 25 mg bid Will address returning to SR when anticoagulated   2. HTN  Stable  3. Prior CVA with seizure disorder Stable Eliquis Continue keppra   Return to afib clinic in 2 weeks  Donald [name], [facility] [address] [place], [place] [address] [phone]

## 2015-03-09 ENCOUNTER — Telehealth: Payer: Self-pay | Admitting: *Deleted

## 2015-03-09 DIAGNOSIS — I10 Essential (primary) hypertension: Secondary | ICD-10-CM

## 2015-03-09 MED ORDER — CARVEDILOL 12.5 MG PO TABS: 12.5000 mg | ORAL_TABLET | Freq: Two times a day (BID) | ORAL | Status: DC

## 2015-03-09 NOTE — Telephone Encounter (Signed)
LM requesting call back.  

## 2015-03-09 NOTE — Telephone Encounter (Signed)
Reviewed LINQ transmissions with Dr. [name].  Transmissions showing 3-4 sec pauses while in AF.  Currently, patient is taking carvedilol 25mg  BID with meals.  Verbal order from Dr. Philippa Sicks patient decrease carvedilol dose to 12.5mg  BID with meals.  Able to reach patient on mobile number.  Explained that due to patient's HR while in AF, he should decrease his carvedilol dose.  Advised patient that he can split the 25mg  tablets that he currently has and take a half tablet with each meal until his mail order prescription comes in.  Patient voices understanding of all instructions, including new dosage and administration instructions.  He states that he is feeling "a little less run down" since his AF Clinic appointment, but states he is still "sluggish".  Patient denies any acute distress and states that "compared to a few days ago, I'm doing much better".  Prescription sent to patient's preferred pharmacy electronically.  Advised patient to call our office or the AF Clinic with worsening symptoms, questions, or concerns.  Patient again voices understanding of all instructions and denies additional questions at this time.

## 2015-03-12 ENCOUNTER — Telehealth: Payer: Self-pay | Admitting: *Deleted

## 2015-03-12 NOTE — Telephone Encounter (Signed)
Spoke with patient, who states he thinks he is feeling better.  He states that he is still "weak", but he is overall better compared to how he has felt the past two weeks.  He states he has been taking his decreased carvedilol dose and his other prescribed medications as instructed.  He denies dizziness or lightheadedness and states that yesterday he went out for a walk.  Patient asked if it would be safe for him to go to his grandson's soccer game this evening; I advised that he can participate in his normal daily activities as long as he feels up to it.  Patient is appreciative of return call and denies any questions or concerns at this time.  Will review pause episodes with Dr. [name] and call patient with any additional recommendations.

## 2015-03-12 NOTE — Telephone Encounter (Signed)
Called patient to let him know that per Dr. [name], he should hold his carvedilol due to his heart rate trend until instructed otherwise.  Encouraged patient to continue checking his blood pressures daily and to continue all of his other medications as prescribed.  Confirmed ROV with Roderic Palau, NP in the AF clinic on 03/21/15 at 3:00pm.  Patient voiced understanding and denies additional questions or concerns at this time.  Encouraged patient to call with worsening symptoms, questions, or concerns.

## 2015-03-12 NOTE — Telephone Encounter (Signed)
Returning your call. Please call him back at [phone]

## 2015-03-12 NOTE — Telephone Encounter (Signed)
[facility] requesting call back.  Need to know if patient was symptomatic with pauses on LINQ transmission from 03/11/15.

## 2015-03-20 ENCOUNTER — Encounter: Payer: Self-pay | Admitting: Internal Medicine

## 2015-03-21 ENCOUNTER — Ambulatory Visit (HOSPITAL_COMMUNITY)
Admission: RE | Admit: 2015-03-21 | Discharge: 2015-03-21 | Disposition: A | Discharge status: Home / Self Care | Payer: Medicare Other | Source: Ambulatory Visit | Attending: Nurse Practitioner | Admitting: Nurse Practitioner

## 2015-03-21 VITALS — BP 154/100 | HR 65 | Ht 75.0 in | Wt 226.0 lb

## 2015-03-21 DIAGNOSIS — I10 Essential (primary) hypertension: Secondary | ICD-10-CM | POA: Insufficient documentation

## 2015-03-21 DIAGNOSIS — I4891 Unspecified atrial fibrillation: Secondary | ICD-10-CM | POA: Insufficient documentation

## 2015-03-21 DIAGNOSIS — I481 Persistent atrial fibrillation: Secondary | ICD-10-CM | POA: Diagnosis not present

## 2015-03-21 DIAGNOSIS — I4819 Other persistent atrial fibrillation: Secondary | ICD-10-CM

## 2015-03-21 DIAGNOSIS — G40909 Epilepsy, unspecified, not intractable, without status epilepticus: Secondary | ICD-10-CM | POA: Insufficient documentation

## 2015-03-22 ENCOUNTER — Encounter (HOSPITAL_COMMUNITY): Payer: Self-pay | Admitting: Nurse Practitioner

## 2015-03-22 NOTE — Progress Notes (Signed)
Patient ID: Donald [name], male   DOB: 1946-04-07, 69 y.o.   MRN: [id number]    Primary Care Physician: London Pepper, MD Referring Physician: Dr. Loney [name] Donald [name] is a 69 y.o. male with a h/o stroke 06/2013 that is referred to afib clinic for new onset afib. He gives a history of feeling weak for around one week and went to his PCP for evaluation and afib was found. Plavix was stopped on Monday and he was started on eliquis 5 mg bid for a chadsvasc score of at least 5. Afib with slow v response in the 50's by EKG. He has been on  carvedilol 25 bid for years for his difficult to treat BP. His CVA did affect his left side of body and with time and therapy, he has regained most of lost function. He did have a IRL implanted at time of stroke. He does not have sleep apnea. Has lost around 60 lbs since his CVA. No alcohol use, minimal caffeine. 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  He returns today on eliquis and tolerating. He was contacted from the device clinic at [facility] [address] office and asked to stop carvedilol due to afib with SVR and  3-4 sec pauses. He tried to stop drug but had increase in BP to 702 systolic. He is very anxious re his BP becoming too high because this is what was thought to have triggered his stroke. He is actually feeling so much better and his energy level which declined with onset of afib has improved to the point that he is back to his usual activities. EKG shows afib at 65 bpm. Checked with device clinic. He had a 3 second pause this am at 8:30, asymptomatic, but no further 4 second pauses since coreg was reduced.  Today, he denies symptoms of palpitations, chest pain, shortness of breath, orthopnea, PND, lower extremity edema, dizziness, presyncope, syncope, or neurologic sequela. Positive for fatigue. The patient is tolerating medications  without difficulties and is otherwise without complaint today.   Past Medical History  Diagnosis Date  . Hypertension   . Diabetes mellitus without complication ([place])   . Hyperlipemia   . large right middle cerebral artery infarct, embolic 11/02/7856    a. s/p IV tPA, b. source unknown, c. loop recorder placed   Past Surgical History  Procedure Laterality Date  . Tee without cardioversion N/A 06/07/2013    Procedure: TRANSESOPHAGEAL ECHOCARDIOGRAM (TEE);  Surgeon: Donald Spark, MD;  Location: [facility] ENDOSCOPY;  Service: Cardiovascular;  Laterality: N/A;  . Loop recorder implant  06/07/2013    MDT LinQ implanted by Dr Donald [name] for cryptogenic stroke  . Loop recorder implant N/A 06/07/2013    Procedure: LOOP RECORDER IMPLANT;  Surgeon: Donald Mark, MD;  Location: [place] CATH LAB;  Service: Cardiovascular;  Laterality: N/A;    Current Outpatient Prescriptions  Medication Sig Dispense Refill  . amLODipine (NORVASC) 10 MG tablet Take 1 tablet (10 mg total) by mouth daily. 30 tablet 1  . apixaban (ELIQUIS) 5 MG TABS tablet Take 1 tablet (5 mg total) by mouth 2 (two) times daily. 60 tablet 1  . cloNIDine (CATAPRES) 0.3 MG tablet Take 0.3 mg by mouth 2 (two) times daily.    . hydrALAZINE (APRESOLINE) 10 MG tablet Take 10 mg by mouth 2 (two) times daily.     . hydrochlorothiazide (  HYDRODIURIL) 25 MG tablet Take 25 mg by mouth daily.    Marland Kitchen levETIRAcetam (KEPPRA XR) 500 MG 24 hr tablet Take 2 tablets by mouth at  bedtime 180 tablet 1  . lisinopril (PRINIVIL,ZESTRIL) 40 MG tablet Take 1 tablet (40 mg total) by mouth daily. 30 tablet 1  . metFORMIN (GLUCOPHAGE-XR) 750 MG 24 hr tablet Take 750 mg by mouth 2 (two) times daily.     . potassium chloride SA (K-DUR,KLOR-CON) 20 MEQ tablet Take 20 mEq by mouth 3 (three) times daily.      No current facility-administered medications for this encounter.    Allergies  Allergen Reactions  . Bidil [Isosorb Dinitrate-Hydralazine] Other (See Comments)     Lethargic,tired     Social History   Social History  . Marital Status: Married    Spouse Name: Donald [name]  . Number of Children: 2  . Years of Education: College   Occupational History  . Retired    Social History Main Topics  . Smoking status: Former Research scientist (life sciences)  . Smokeless tobacco: Never Used     Comment: Quit 25 years ago.  . Alcohol Use: 0.6 oz/week    1 Glasses of wine per week     Comment: Rare  . Drug Use: No  . Sexual Activity: No   Other Topics Concern  . Not on file   Social History Narrative   Patient lives at home Donald [name]).   Retired works part time 10 hours a week.   Right handed   Education college   Caffeine: one cup daily coffee   No soda or tea.    Family History  Problem Relation Age of Onset  . Dementia Mother   . Diabetes Mother   . Heart attack Father     ROS- All systems are reviewed and negative except as per the HPI above  Physical Exam: Filed Vitals:   03/21/15 1514  BP: 154/100  Pulse: 65  Height: 6\' 3"  (1.905 m)  Weight: 226 lb (102.513 kg)    GEN- The patient is well appearing, alert and oriented x 3 today.   Head- normocephalic, atraumatic Eyes-  Sclera clear, conjunctiva pink Ears- hearing intact Oropharynx- clear Neck- supple, no JVP Lymph- no cervical lymphadenopathy Lungs- Clear to ausculation bilaterally, normal work of breathing Heart- Slow irregular rate and rhythm, no murmurs, rubs or gallops, PMI not laterally displaced GI- soft, NT, ND, + BS Extremities- no clubbing, cyanosis, or edema MS- no significant deformity or atrophy Skin- no rash or lesion Psych- euthymic mood, full affect Neuro- strength and sensation are intact  EKG-Afib at 65 bpm, LAD, qrs int 86 ms, qtc 388 ms.  Assessment and Plan: 1. New onset afib, rate controlled Off plavix, on eliquis 5 mg bid Continue carvedilol 12.5 mg at bedtime for now for BP benefit,until further discussed with Dr. Rayann [name]. Still having asymptomatic 3 sec pauses, but no  further 4 second pauses since coreg decreased. Currently feels well with improved energy and is minimally symptomatic with afib.  2. HTN  Stable, but running slightly higher since coreg dose reduced but pt states at home BP readings better than in clinic.  3. Prior CVA with seizure disorder Stable Eliquis Continue keppra   F/u will hinge on discussion with Dr. Rayann [name] re afib with SVR and pauses, unable to fully stop coreg  Butch Penny C. Damaria Vachon, [facility] [address] [place], [place] [address] [phone]

## 2015-03-26 ENCOUNTER — Telehealth (HOSPITAL_COMMUNITY): Payer: Self-pay | Admitting: Nurse Practitioner

## 2015-03-26 LAB — CUP PACEART REMOTE DEVICE CHECK: Date Time Interrogation Session: 20161004073747

## 2015-03-26 NOTE — Progress Notes (Signed)
Carelink summary report received. Battery status OK. Normal device function. No new symptom, tachy, or brady episodes. 25 pause episodes, longest 4 sec, patient feels generally fatigued and weak. 7 AF episodes (burden 9.3%), started on Eliquis on 03/05/15, avg V rate 50s-60s. Monthly summary reports and ROV with AF clinic on 03/21/15 at 3:00pm.

## 2015-03-26 NOTE — Telephone Encounter (Signed)
I called pt re f/u from last visit after discussion with Dr. [name]. He prefers him to be off BB due to pauses. The pt had reduced coreg to 12.5 mg qd but was fearful to stop due to BP issues. Over the weekend, he reports that he has stopped BB and his BP is doing ok. He will continue to monitor BP. Will get a F/u  appointment with Dr. [name] to further discuss  any pauses that continue off bb and further treatment of afib.

## 2015-03-27 DIAGNOSIS — Z23 Encounter for immunization: Secondary | ICD-10-CM | POA: Diagnosis not present

## 2015-03-30 DIAGNOSIS — D225 Melanocytic nevi of trunk: Secondary | ICD-10-CM | POA: Diagnosis not present

## 2015-03-30 DIAGNOSIS — D485 Neoplasm of uncertain behavior of skin: Secondary | ICD-10-CM | POA: Diagnosis not present

## 2015-03-30 DIAGNOSIS — L309 Dermatitis, unspecified: Secondary | ICD-10-CM | POA: Diagnosis not present

## 2015-04-05 ENCOUNTER — Ambulatory Visit (INDEPENDENT_AMBULATORY_CARE_PROVIDER_SITE_OTHER): Payer: Medicare Other | Admitting: *Deleted

## 2015-04-05 DIAGNOSIS — I10 Essential (primary) hypertension: Secondary | ICD-10-CM | POA: Diagnosis not present

## 2015-04-05 DIAGNOSIS — I639 Cerebral infarction, unspecified: Secondary | ICD-10-CM | POA: Diagnosis not present

## 2015-04-05 DIAGNOSIS — E119 Type 2 diabetes mellitus without complications: Secondary | ICD-10-CM | POA: Diagnosis not present

## 2015-04-05 DIAGNOSIS — I4891 Unspecified atrial fibrillation: Secondary | ICD-10-CM | POA: Diagnosis not present

## 2015-04-05 DIAGNOSIS — E559 Vitamin D deficiency, unspecified: Secondary | ICD-10-CM | POA: Diagnosis not present

## 2015-04-05 DIAGNOSIS — G40909 Epilepsy, unspecified, not intractable, without status epilepticus: Secondary | ICD-10-CM | POA: Diagnosis not present

## 2015-04-05 NOTE — Progress Notes (Signed)
Loop recorder 

## 2015-04-12 ENCOUNTER — Encounter: Payer: Self-pay | Admitting: Internal Medicine

## 2015-04-13 ENCOUNTER — Encounter: Payer: Self-pay | Admitting: Internal Medicine

## 2015-04-16 ENCOUNTER — Encounter: Payer: Self-pay | Admitting: Internal Medicine

## 2015-04-16 ENCOUNTER — Ambulatory Visit (INDEPENDENT_AMBULATORY_CARE_PROVIDER_SITE_OTHER): Payer: Medicare Other | Admitting: Internal Medicine

## 2015-04-16 VITALS — BP 138/82 | HR 84 | Ht 75.0 in | Wt 229.8 lb

## 2015-04-16 DIAGNOSIS — I481 Persistent atrial fibrillation: Secondary | ICD-10-CM | POA: Diagnosis not present

## 2015-04-16 DIAGNOSIS — I639 Cerebral infarction, unspecified: Secondary | ICD-10-CM | POA: Diagnosis not present

## 2015-04-16 DIAGNOSIS — R001 Bradycardia, unspecified: Secondary | ICD-10-CM | POA: Diagnosis not present

## 2015-04-16 DIAGNOSIS — I4819 Other persistent atrial fibrillation: Secondary | ICD-10-CM

## 2015-04-16 DIAGNOSIS — I10 Essential (primary) hypertension: Secondary | ICD-10-CM | POA: Diagnosis not present

## 2015-04-16 LAB — CUP PACEART INCLINIC DEVICE CHECK: Date Time Interrogation Session: 20161114173602

## 2015-04-16 NOTE — Patient Instructions (Signed)
Medication Instructions:  Your physician recommends that you continue on your current medications as directed. Please refer to the Current Medication list given to you today.   Labwork: None ordered   Testing/Procedures: Your physician has recommended that you have a sleep study. This test records several body functions during sleep, including: brain activity, eye movement, oxygen and carbon dioxide blood levels, heart rate and rhythm, breathing rate and rhythm, the flow of air through your mouth and nose, snoring, body muscle movements, and chest and belly movement.     Follow-Up: Your physician wants you to follow-up in: 3 months with Chanetta Marshall, NP      If you need a refill on your cardiac medications before your next appointment, please call your pharmacy.

## 2015-04-16 NOTE — Progress Notes (Signed)
Electrophysiology Office Note Date: 04/16/2015  ID:  Donald [name], DOB [birth year], MRN [id number]  PCP: Donald Pepper, MD Electrophysiologist: Donald [name]  CC: follow up on pauses seen on ILR interrogation  Donald [name] is a 69 y.o. male who presents today for routine electrophysiology followup. He underwent ILR implantation for stroke and had atrial fibrillation discovered. He has been started on Eliquis and device interrogation has demonstrated multiple pauses of up to 3 seconds that have been asymptomatic.  He presents today for routine follow up. Since being told that he has had pauses, he has minimized his activity in fear of falling.  He does snore and has lost 60 pounds.  He does not remember ever being tested for sleep apnea.  He denies chest pain, palpitations, dyspnea, PND, orthopnea, nausea, vomiting, dizziness, syncope, edema, weight gain, or early satiety.  Past Medical History  Diagnosis Date  . Hypertension   . Diabetes mellitus without complication ([place])   . Hyperlipemia   . large right middle cerebral artery infarct, embolic [id number]    a. s/p IV tPA, b. source unknown, c. loop recorder placed   Past Surgical History  Procedure Laterality Date  . Tee without cardioversion N/A 06/07/2013    Procedure: TRANSESOPHAGEAL ECHOCARDIOGRAM (TEE);  Surgeon: Donald Spark, MD;  Location: [facility] ENDOSCOPY;  Service: Cardiovascular;  Laterality: N/A;  . Loop recorder implant  06/07/2013    MDT LinQ implanted by Dr Donald [name] for cryptogenic stroke  . Loop recorder implant N/A 06/07/2013    Procedure: LOOP RECORDER IMPLANT;  Surgeon: Donald Mark, MD;  Location: [place] CATH LAB;  Service: Cardiovascular;  Laterality: N/A;    Current Outpatient Prescriptions  Medication Sig Dispense Refill  . amLODipine (NORVASC) 10 MG tablet Take 1 tablet (10 mg total) by mouth daily. 30 tablet 1  . apixaban (ELIQUIS) 5 MG TABS tablet Take 1 tablet (5 mg total) by mouth 2 (two) times daily. 60  tablet 1  . cloNIDine (CATAPRES) 0.3 MG tablet Take 0.3 mg by mouth 2 (two) times daily.    . hydrALAZINE (APRESOLINE) 10 MG tablet Take 10 mg by mouth daily.     . hydrochlorothiazide (HYDRODIURIL) 25 MG tablet Take 25 mg by mouth daily.    Marland Kitchen levETIRAcetam (KEPPRA XR) 500 MG 24 hr tablet Take 2 tablets by mouth at  bedtime 180 tablet 1  . lisinopril (PRINIVIL,ZESTRIL) 40 MG tablet Take 1 tablet (40 mg total) by mouth daily. 30 tablet 1  . metFORMIN (GLUCOPHAGE-XR) 750 MG 24 hr tablet Take 750 mg by mouth 2 (two) times daily.     . potassium chloride SA (K-DUR,KLOR-CON) 20 MEQ tablet Take 20 mEq by mouth 3 (three) times daily.      No current facility-administered medications for this visit.    Allergies:   Bidil   Social History: Social History   Social History  . Marital Status: Married    Spouse Name: Donald [name]  . Number of Children: 2  . Years of Education: College   Occupational History  . Retired    Social History Main Topics  . Smoking status: Former Research scientist (life sciences)  . Smokeless tobacco: Never Used     Comment: Quit 25 years ago.  . Alcohol Use: 0.6 oz/week    1 Glasses of wine per week     Comment: Rare  . Drug Use: No  . Sexual Activity: No   Other Topics Concern  . Not on file   Social History Narrative   Patient  lives at home [place]).   Retired works part time 10 hours a week.   Right handed   Education college   Caffeine: one cup daily coffee   No soda or tea.    Family History: Family History  Problem Relation Age of Onset  . Dementia Mother   . Diabetes Mother   . Heart attack Father     Review of Systems: All other systems reviewed and are otherwise negative except as noted above.   Physical Exam: VS:  BP 138/82 mmHg  Pulse 84  Ht 6\' 3"  (1.905 m)  Wt 229 lb 12.8 oz (104.237 kg)  BMI 28.72 kg/m2 , BMI Body mass index is 28.72 kg/(m^2). Wt Readings from Last 3 Encounters:  04/16/15 229 lb 12.8 oz (104.237 kg)  03/21/15 226 lb (102.513 kg)    03/07/15 225 lb 12.8 oz (102.422 kg)    GEN- The patient is well appearing, alert and oriented x 3 today.   HEENT: normocephalic, atraumatic; sclera clear, conjunctiva pink; hearing intact; oropharynx clear; neck supple  Lungs- Clear to ausculation bilaterally, normal work of breathing.  No wheezes, rales, rhonchi Heart- Irregular rate and rhythm  GI- soft, non-tender, non-distended, bowel sounds present  Extremities- no clubbing, cyanosis, or edema; DP/PT/radial pulses 2+ bilaterally MS- no significant deformity or atrophy Skin- warm and dry, no rash or lesion  Psych- euthymic mood, full affect Neuro- strength and sensation are intact   EKG:  EKG is not ordered today.  Recent Labs: 09/19/2014: ALT 13; BUN 18; Creatinine, Ser 1.00; Hemoglobin 13.9; Platelets 216; Potassium 3.3*; Sodium 137   Assessment and Plan: 1.  Persistent atrial fibrillation The patient has asymptomatic persistent atrial fibrillation Continue Eliquis for CHADS2VASC of at least 5 (there is some concern about cost - wife will check with insurance to see which DOAC is cheaper for them). Ventricular rates are well controlled. Pauses are asymptomatic - pt reassured today. Device reprogrammed to detect pauses of >4.5 seconds Will check sleep study for pauses, daytime somnolence, and snoring   2.  HTN Stable off of Coreg No changes today  Current medicines are reviewed at length with the patient today.   The patient does not have concerns regarding his medicines.  The following changes were made today:  none  Labs/ tests ordered today include:  Orders Placed This Encounter  Procedures  . Implantable device check  . Split night study    Disposition:   Follow up with EP NP in 3 months    Signed, Donald Grayer, MD  04/16/2015 5:37 PM   [place] [address] [phone] (office) [phone] (fax)

## 2015-04-23 ENCOUNTER — Telehealth: Payer: Self-pay | Admitting: Internal Medicine

## 2015-04-23 MED ORDER — APIXABAN 5 MG PO TABS: 5.0000 mg | ORAL_TABLET | Freq: Two times a day (BID) | ORAL | Status: DC

## 2015-04-23 NOTE — Telephone Encounter (Signed)
New problem   Pt need to speak to nurse concerning pt's Eliquis and possibly putting pt on cheaper medication and sleep test. Please call pt.

## 2015-04-23 NOTE — Telephone Encounter (Addendum)
Spoke with patient of whom had multiple questions.  He wanted his Eliquis called in to [facility].  Which I have done.  He also wanted to know about his follow up and his sleep study as he has not been contacted in regards to either.  I have sent both Lorenda Hatchet and Talbert Cage staff messages to call the patient.   He is worried that his Rx is not going to get to him and he has enough pills to last until next week. I have looked to see if we have samples and we do not.  He is aware.  At worst he can buy from [facility]

## 2015-04-25 ENCOUNTER — Telehealth: Payer: Self-pay

## 2015-04-25 NOTE — Telephone Encounter (Signed)
Prior auth for Eliquis 5 mg sent to [facility]. 

## 2015-04-30 ENCOUNTER — Telehealth: Payer: Self-pay

## 2015-04-30 NOTE — Telephone Encounter (Signed)
Eliquis 5 mg approved by [facility] and is good through 04/24/2016. PA # [id number]. Attempted to notify patient of this, but voicemailbox was full.

## 2015-05-01 NOTE — Telephone Encounter (Signed)
Spoke with patient's wife and it has been approved and was expedited to them

## 2015-05-06 LAB — CUP PACEART REMOTE DEVICE CHECK: Date Time Interrogation Session: 20161103073746

## 2015-05-06 NOTE — Progress Notes (Signed)
Carelink summary report received. Battery status OK. Normal device function. No new symptom, tachy, or brady episodes. 185 pause episodes, longest ~4sec, patient asymptomatic, aware to call if symptomatic. 1 AF episode (burden 100%), +Eliquis, avg V rate controlled. Monthly summary reports and ROV with JA on 04/16/15.

## 2015-05-07 ENCOUNTER — Ambulatory Visit (INDEPENDENT_AMBULATORY_CARE_PROVIDER_SITE_OTHER): Payer: Medicare Other | Admitting: *Deleted

## 2015-05-07 DIAGNOSIS — I639 Cerebral infarction, unspecified: Secondary | ICD-10-CM

## 2015-05-07 NOTE — Progress Notes (Signed)
Carelink Summary Report / Loop Recorder 

## 2015-06-05 ENCOUNTER — Ambulatory Visit (INDEPENDENT_AMBULATORY_CARE_PROVIDER_SITE_OTHER): Payer: Medicare Other | Admitting: *Deleted

## 2015-06-05 DIAGNOSIS — I639 Cerebral infarction, unspecified: Secondary | ICD-10-CM

## 2015-06-05 NOTE — Progress Notes (Signed)
Carelink Summary Report / Loop Recorder 

## 2015-07-01 ENCOUNTER — Ambulatory Visit (HOSPITAL_BASED_OUTPATIENT_CLINIC_OR_DEPARTMENT_OTHER): Payer: Medicare Other | Attending: Internal Medicine

## 2015-07-01 VITALS — Ht 75.0 in | Wt 220.0 lb

## 2015-07-01 DIAGNOSIS — I481 Persistent atrial fibrillation: Secondary | ICD-10-CM | POA: Diagnosis not present

## 2015-07-01 DIAGNOSIS — I4819 Other persistent atrial fibrillation: Secondary | ICD-10-CM

## 2015-07-01 DIAGNOSIS — Z7901 Long term (current) use of anticoagulants: Secondary | ICD-10-CM | POA: Diagnosis not present

## 2015-07-01 DIAGNOSIS — I493 Ventricular premature depolarization: Secondary | ICD-10-CM | POA: Diagnosis not present

## 2015-07-01 DIAGNOSIS — Z79899 Other long term (current) drug therapy: Secondary | ICD-10-CM | POA: Diagnosis not present

## 2015-07-01 DIAGNOSIS — I4891 Unspecified atrial fibrillation: Secondary | ICD-10-CM | POA: Diagnosis not present

## 2015-07-01 DIAGNOSIS — R0683 Snoring: Secondary | ICD-10-CM | POA: Diagnosis not present

## 2015-07-03 ENCOUNTER — Encounter (HOSPITAL_BASED_OUTPATIENT_CLINIC_OR_DEPARTMENT_OTHER): Payer: Self-pay

## 2015-07-03 ENCOUNTER — Telehealth: Payer: Self-pay | Admitting: Cardiology

## 2015-07-03 DIAGNOSIS — R0683 Snoring: Secondary | ICD-10-CM

## 2015-07-03 HISTORY — DX: Snoring: R06.83

## 2015-07-03 LAB — CUP PACEART REMOTE DEVICE CHECK: Date Time Interrogation Session: 20161203080729

## 2015-07-03 NOTE — Sleep Study (Signed)
   Patient Name: Raequon, Zettler MRN: [id number] Study Date: 07/01/2015 Gender: Male D.O.B: 1946/01/05 Age (years): 36 Referring Provider: Thompson Grayer Interpreting Physician: Fransico Him MD, ABSM RPSGT: Madelon Lips  Weight (lbs): 220 Height (inches): 75 BMI: 27 Neck Size: 16.50  CLINICAL INFORMATION Sleep Study Type: NPSG Indication for sleep study: N/A Epworth Sleepiness Score: 1  SLEEP STUDY TECHNIQUE As per the AASM Manual for the Scoring of Sleep and Associated Events v2.3 (April 2016) with a hypopnea requiring 4% desaturations. The channels recorded and monitored were frontal, central and occipital EEG, electrooculogram (EOG), submentalis EMG (chin), nasal and oral airflow, thoracic and abdominal wall motion, anterior tibialis EMG, snore microphone, electrocardiogram, and pulse oximetry.  MEDICATIONS Patient's medications include: AMLODIPINE, ELIQUIS, CLONIDINE, KEPPRA, Hydralazine, HCTZ Kdur, Metformin, Lisinopril, . Medications self-administered by patient during sleep study : No sleep medicine administered.  SLEEP ARCHITECTURE The study was initiated at 10:20:19 PM and ended at 4:38:03 AM. Sleep onset time was 23.0 minutes and the sleep efficiency was reduced at 73.2%. The total sleep time was 276.5 minutes. Stage REM latency was 100.0 minutes. The patient spent 9.58% of the night in stage N1 sleep, 80.11% in stage N2 sleep, 0.00% in stage N3 and 10.31% in REM. Alpha intrusion was absent. Supine sleep was 99.97%.  RESPIRATORY PARAMETERS The overall apnea/hypopnea index (AHI) was 3.0 per hour. There were 1 total apneas, including 1 obstructive, 0 central and 0 mixed apneas. There were 13 hypopneas and 1 RERAs. The AHI during Stage REM sleep was 14.7 per hour. AHI while supine was 3.0 per hour. The mean oxygen saturation was 93.96%. The minimum SpO2 during sleep was 89.00%. Loud snoring was noted during this study.  CARDIAC DATA The 2 lead EKG demonstrated  atrial fibrillation The mean heart rate was 73.31 beats per minute. Other EKG findings include: PVCs.  LEG MOVEMENT DATA The total PLMS were 0 with a resulting PLMS index of 0.00. Associated arousal with leg movement index was 0.0 .  IMPRESSIONS - No significant obstructive sleep apnea occurred during this study (AHI = 3.0/h). - No significant central sleep apnea occurred during this study (CAI = 0.0/h). - The patient had minimal or no oxygen desaturation during the study (Min O2 = 89.00%) - The patient snored with Loud snoring volume. - EKG findings include PVCs and atrial fibrillation. - Clinically significant periodic limb movements did not occur during sleep. No significant associated arousals.  DIAGNOSIS - snoring  RECOMMENDATIONS - Avoid alcohol, sedatives and other CNS depressants that may worsen sleep apnea and disrupt normal sleep architecture. - Sleep hygiene should be reviewed to assess factors that may improve sleep quality. - Weight management and regular exercise should be initiated or continued if appropriate.    Sueanne Margarita Diplomate, American Board of Sleep Medicine  ELECTRONICALLY SIGNED ON:  07/03/2015, 11:24 PM [place] PH: [phone]   FX: [phone] [place]

## 2015-07-03 NOTE — Telephone Encounter (Signed)
Please let patient know that sleep study showed no significant sleep apnea.    

## 2015-07-04 ENCOUNTER — Ambulatory Visit (INDEPENDENT_AMBULATORY_CARE_PROVIDER_SITE_OTHER): Payer: Medicare Other | Admitting: *Deleted

## 2015-07-04 DIAGNOSIS — I639 Cerebral infarction, unspecified: Secondary | ICD-10-CM

## 2015-07-04 NOTE — Progress Notes (Signed)
Carelink Summary Report / Loop Recorder 

## 2015-07-06 NOTE — Telephone Encounter (Signed)
Patient informed of results.  Stated verbal understanding 

## 2015-07-09 ENCOUNTER — Encounter: Payer: Self-pay | Admitting: Adult Health

## 2015-07-09 ENCOUNTER — Ambulatory Visit (INDEPENDENT_AMBULATORY_CARE_PROVIDER_SITE_OTHER): Payer: Medicare Other | Admitting: Adult Health

## 2015-07-09 ENCOUNTER — Other Ambulatory Visit: Payer: Self-pay | Admitting: Adult Health

## 2015-07-09 VITALS — BP 152/88 | Wt 224.0 lb

## 2015-07-09 DIAGNOSIS — R269 Unspecified abnormalities of gait and mobility: Secondary | ICD-10-CM | POA: Diagnosis not present

## 2015-07-09 DIAGNOSIS — R569 Unspecified convulsions: Secondary | ICD-10-CM | POA: Diagnosis not present

## 2015-07-09 DIAGNOSIS — Z8673 Personal history of transient ischemic attack (TIA), and cerebral infarction without residual deficits: Secondary | ICD-10-CM

## 2015-07-09 DIAGNOSIS — I639 Cerebral infarction, unspecified: Secondary | ICD-10-CM | POA: Diagnosis not present

## 2015-07-09 NOTE — Progress Notes (Signed)
I have read the note, and I agree with the clinical assessment and plan.  WILLIS,CHARLES KEITH   

## 2015-07-09 NOTE — Patient Instructions (Addendum)
Continue Eliquis Keep Blood pressure < 130/90  Cholesterol LDL < 100 Continue Keppra If your symptoms worsen or you develop new symptoms please let us know.

## 2015-07-09 NOTE — Progress Notes (Addendum)
PATIENT: Donald [name] DOB: [birth year]  REASON FOR VISIT: follow up -Stroke, seizures HISTORY FROM: patient  HISTORY OF PRESENT ILLNESS: [name]  Is a 70 year old male with a history of stroke and seizures. He returns today for follow-up. The patient was switched to Eliquis by his cardiologist after he was found to have atrial fibrillation in October. He is tolerating this medication well. The patient's blood pressure is elevated slightly today however this is consistent with every time he comes in to the doctor's office. He states that he has whitecoat syndrome. His primary care is managing his cholesterol  And diabetes. He denies any additional strokelike symptoms. He does not have any weakness from the stroke but does feel like his reaction time on the left side is slower. He uses a cane when  Ambulating.  The patient states that he has a hard time with  ambulatingup or down steps. He is wondering if physical therapy would be beneficial. He continues to use Keppra XR thousand milligrams at bedtime. He is not had any additional seizure events. He denies any new neurological symptoms. He returns today for an evaluation.  HISTORY 01/08/15: [name] is a 70 year old male with a history of ischemic stroke and seizures. He returns today for follow-up. The patient continues to take Plavix for stroke prevention. The patient's primary care manages his blood pressure, cholesterol and diabetes. Patient states that his blood pressure is slightly elevated today. However he relates this to "white coat syndrome." Patient states that he had a seizure in April at that time he was placed on Keppra XR 1000 mg at bedtime. He states that since then he has not had any seizures. He does not operate a motor vehicle. He is able to complete all ADLs independently. Patient states that after the stroke he has had some numbness in the bottom of the left foot and that affects his balance. He continues to use a  cane when ambulating. Denies any recent falls. Denies any new neurological symptoms. He returns today for an evaluation.  HISTORY 07/05/14:Mr. Bent is a 70 year old male with a history of stroke on 06/03/13. He returns today for follow-up. He is currently taking Plavix for stroke prevention. His HTN, cholesterol and Diabetes is managed by his PCP. He states that his BP has been controlled. When he checks it at home it is usually 130s/70s. His states that his last hemoglobin A1C was 5.9 % . He continues to take metformin- his dosage has recently been decreased. His cholesterol has been in normal range according to the patient and therefore medication has not been required. Patient will use a cane when he is out in public. He does not use it at home. He feels that his walking has improved. Denies any falls. He notes that his fine motor skills in the left hand has improved. He states that he is now able to cook at home. He does have some sensory changes in the left hand and foot. Denies any new symptoms.   HISTORY 12/20/13 ([name]): is an 70 y.o. male who was visiting here for the holidays on 06/03/2013. When he got up he was normal. As he was returning to bed began to be short of breath. Patient then was noted to have slurred speech by his wife and she noted a facial droop. Patient seemed to be weak on the left a well. EMS was called and the patient was brought in as a code stroke. Initial NIHSS of 8. Patient  was given TPA. No intervenable lesion was seen on CTA. He was admitted to the neuro ICU for further evaluation and treatment. CT angio head showed Subtle attenuation of the distal right superior MCA territory branch vessels as compared to the left. No proximal branch occlusion or high-grade flow-limiting stenosis identified within the right middle cerebral artery proximally. Otherwise unremarkable CTA of the head without evidence of occlusion, high-grade stenosis, or aneurysm elsewhere within the brain.   CT angio neck was a normal CTA of the neck without evidence of high-grade stenosis, occlusion, or dissection. 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 MRI of the brain showed an acute large right middle cerebral artery territory infarct. Minimal underlying petechial hemorrhage without lobar hematoma. 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 Minimal additional white matter changes suggest chronic small vessel ischemic disease. Acute on chronic paranasal sinusitis. 2D Echocardiogram with the estimated ejection fraction was in the range of 60% to 65%. He was [facility] to [facility]/07/2013 through 07/02/2013. He is completed home physical therapy and occupational therapy and is about to start going to outpatient neuro rehabilitation. He is making good progress with his rehabilitation and is very determined. He and his wife have moved in with her daughter here in [place]. He has established care with Dr. Orland Mustard and is seeing him every 3 weeks for adjustments to his hypertensive medications, as his hypertension has been very difficult to control since his 27s. He thinks he forgot to take his blood pressure medicine the day he had the stroke. His blood pressure is elevated office today at 161/90, he is tolerating Plavix well without any significant bruising or bleeding. He is able to ambulate short distances with a hemiwalker and AFO brace on the left. He is having some spasticity in the left arm with clonus at times.   REVIEW OF SYSTEMS: Out of a complete 14 system review of symptoms, the patient complains only of the following symptoms, and all other reviewed systems are negative.  ALLERGIES: Allergies  Allergen Reactions  . Bidil [Isosorb Dinitrate-Hydralazine] Other (See Comments)    Lethargic,tired     HOME MEDICATIONS: Outpatient Prescriptions Prior to Visit  Medication Sig Dispense Refill  . amLODipine (NORVASC) 10 MG  tablet Take 1 tablet (10 mg total) by mouth daily. 30 tablet 1  . apixaban (ELIQUIS) 5 MG TABS tablet Take 1 tablet (5 mg total) by mouth 2 (two) times daily. 180 tablet 3  . cloNIDine (CATAPRES) 0.3 MG tablet Take 0.3 mg by mouth 2 (two) times daily.    . hydrALAZINE (APRESOLINE) 10 MG tablet Take 10 mg by mouth daily.     . hydrochlorothiazide (HYDRODIURIL) 25 MG tablet Take 25 mg by mouth daily.    Marland Kitchen levETIRAcetam (KEPPRA XR) 500 MG 24 hr tablet Take 2 tablets by mouth at  bedtime 180 tablet 1  . lisinopril (PRINIVIL,ZESTRIL) 40 MG tablet Take 1 tablet (40 mg total) by mouth daily. 30 tablet 1  . metFORMIN (GLUCOPHAGE-XR) 750 MG 24 hr tablet Take 750 mg by mouth 2 (two) times daily.     . potassium chloride SA (K-DUR,KLOR-CON) 20 MEQ tablet Take 20 mEq by mouth 3 (three) times daily.      No facility-administered medications prior to visit.    PAST MEDICAL HISTORY: Past Medical History  Diagnosis Date  . Hypertension   . Diabetes mellitus without complication ([place])   . Hyperlipemia   . large right middle cerebral artery infarct, embolic 123XX123  a. s/p IV tPA, b. source unknown, c. loop recorder placed  . Snoring 07/03/2015    PAST SURGICAL HISTORY: Past Surgical History  Procedure Laterality Date  . Tee without cardioversion N/A 06/07/2013    Procedure: TRANSESOPHAGEAL ECHOCARDIOGRAM (TEE);  Surgeon: [name], MD;  Location[facility] ENDOSCOPY;  Service: Cardiovascular;  Laterality: N/A;  . Loop recorder implant  06/07/2013    MDT LinQ implanted by Dr [name] for cryptogenic stroke  . Loop recorder implant N/A 06/07/2013    Procedure: LOOP RECORDER IMPLANT;  Surgeon: [name], MD;  Location: [place] CATH LAB;  Service: Cardiovascular;  Laterality: N/A;    FAMILY HISTORY: Family History  Problem Relation Age of Onset  . Dementia Mother   . Diabetes Mother   . Heart attack Father     SOCIAL HISTORY: Social History   Social History  . Marital Status: Married    Spouse  Name: [name]  . Number of Children: 2  . Years of Education: College   Occupational History  . Retired    Social History Main Topics  . Smoking status: Former Research scientist (life sciences)  . Smokeless tobacco: Never Used     Comment: Quit 25 years ago.  . Alcohol Use: 0.6 oz/week    1 Glasses of wine per week     Comment: Rare  . Drug Use: No  . Sexual Activity: No   Other Topics Concern  . Not on file   Social History Narrative   Patient lives at home [name]).   Retired works part time 10 hours a week.   Right handed   Education college   Caffeine: one cup daily coffee   No soda or tea.      PHYSICAL EXAM  Filed Vitals:   07/09/15 1656  BP: 152/88  Weight: 224 lb (101.606 kg)   Body mass index is 28 kg/(m^2).  Generalized: Well developed, in no acute distress   Neurological examination  Mentation: Alert oriented to time, place, history taking. Follows all commands speech and language fluent Cranial nerve II-XII: Pupils were equal round reactive to light. Extraocular movements were full, visual field were full on confrontational test. Facial sensation and strength were normal. Uvula tongue midline. Head turning and shoulder shrug  were normal and symmetric. Motor: The motor testing reveals 5 over 5 strength of all 4 extremities. Good symmetric motor tone is noted throughout.  Sensory: Sensory testing is intact to soft touch on all 4 extremities. No evidence of extinction is noted.  Coordination: Cerebellar testing reveals good finger-nose-finger on the right. Ataxia noted on the left. Good heel-to-shin bilaterally.  Gait and station: Gait is normal. Tandem gait is normal. Romberg is negative. No drift is seen.  Reflexes: Deep tendon reflexes are symmetric and normal bilaterally.   DIAGNOSTIC DATA (LABS, IMAGING, TESTING) - I reviewed patient records, labs, notes, testing and imaging myself where available.  Lab Results  Component Value Date   WBC 10.7* 09/19/2014   HGB 13.9  09/19/2014   HCT 41.0 09/19/2014   MCV 92.3 09/19/2014   PLT 216 09/19/2014      Component Value Date/Time   NA 137 09/19/2014 1908   K 3.3* 09/19/2014 1908   CL 99 09/19/2014 1908   CO2 20 09/19/2014 1856   GLUCOSE 131* 09/19/2014 1908   BUN 18 09/19/2014 1908   CREATININE 1.00 09/19/2014 1908   CALCIUM 9.1 09/19/2014 1856   PROT 7.0 09/19/2014 1856   ALBUMIN 3.9 09/19/2014 1856   AST 24  09/19/2014 1856   ALT 13 09/19/2014 1856   ALKPHOS 58 09/19/2014 1856   BILITOT 1.1 09/19/2014 1856   GFRNONAA 65* 09/19/2014 1856   GFRAA 76* 09/19/2014 1856   Lab Results  Component Value Date   CHOL 139 09/20/2014   HDL 28* 09/20/2014   LDLCALC 68 09/20/2014   TRIG 214* 09/20/2014   CHOLHDL 5.0 09/20/2014   Lab Results  Component Value Date   HGBA1C 6.6* 09/20/2014     ASSESSMENT AND PLAN 70 y.o. year old male  has a past medical history of Hypertension; Diabetes mellitus without complication ([place]); Hyperlipemia; large right middle cerebral artery infarct, embolic (123XX123); and Snoring (07/03/2015). here with:  1.  History of stroke 2. Seizures  3. Abnormality of gait   The patient will continue on Eliquis.  He should maintain strict control of his blood pressure with goal less than 130/90. Cholesterol LDL less than  100.  He will continue Keppra for seizure prevention. I will refer the patient for physical therapy. Patient advised that he has any seizure episodes he should let us know. If he develops any stroke-like symptoms he should call 911 immediately. He will follow-up in 6 months or sooner if needed.     Ward Givens, MSN, NP-C 07/09/2015, 3:23 PM [facility] [address] [place], [place] [address] [phone]

## 2015-07-11 ENCOUNTER — Ambulatory Visit: Payer: Medicare Other | Admitting: Adult Health

## 2015-07-12 DIAGNOSIS — E559 Vitamin D deficiency, unspecified: Secondary | ICD-10-CM | POA: Diagnosis not present

## 2015-07-12 DIAGNOSIS — G40909 Epilepsy, unspecified, not intractable, without status epilepticus: Secondary | ICD-10-CM | POA: Diagnosis not present

## 2015-07-12 DIAGNOSIS — I639 Cerebral infarction, unspecified: Secondary | ICD-10-CM | POA: Diagnosis not present

## 2015-07-12 DIAGNOSIS — Z23 Encounter for immunization: Secondary | ICD-10-CM | POA: Diagnosis not present

## 2015-07-12 DIAGNOSIS — E119 Type 2 diabetes mellitus without complications: Secondary | ICD-10-CM | POA: Diagnosis not present

## 2015-07-12 DIAGNOSIS — I10 Essential (primary) hypertension: Secondary | ICD-10-CM | POA: Diagnosis not present

## 2015-07-12 DIAGNOSIS — I4891 Unspecified atrial fibrillation: Secondary | ICD-10-CM | POA: Diagnosis not present

## 2015-07-19 LAB — CUP PACEART REMOTE DEVICE CHECK: Date Time Interrogation Session: 20170102083758

## 2015-07-19 NOTE — Progress Notes (Signed)
Carelink summary report received. Battery status OK. Normal device function. No new symptom episodes, tachy episodes, brady, or pause episodes. 100% AF, +Eliquis, V rates controlled. Monthly summary reports and ROV/PRN

## 2015-07-22 NOTE — Progress Notes (Signed)
Electrophysiology Office Note Date: 07/23/2015  ID:  Donald [name], DOB [birth year], MRN [id number]  PCP: London Pepper, MD Electrophysiologist: [name]  CC:  follow up on ILR interrogation  Donald [name] is a 70 y.o. male who presents today for routine electrophysiology followup.   He underwent ILR implantation for stroke and had atrial fibrillation discovered. He has been on Eliquis  He was noted to have multiple pauses of up to 3 seconds that have been asymptomatic.  He was seen by Dr. [name] 04/15/16 and reassured regarding the pauses, and his ILR was reprorgammed to detect pauses >4.5 seconds.  He was recommended to undergo sleep study. He did get done without significant sleep apnea noted.  He mentions he thinks he may feel like he doesn't quite have the energy he used to, but does go for walks for exercise and does his routine daily activities without difficulty.  After lengthy discussion about treatment options, DCCV, ADD therapy,  he feels this may be more cabin fever, in the winter not out and about as often as usual.  He denies any kind of CP, palpitations or SOB, no dizziness, neat syncope or syncope.  He denies any bleeding or signs of bleeding  He reports he had full labs done last week via his PMD and reported to him to be very good, mentioning only that his vit D was a little low.   Past Medical History  Diagnosis Date  . Hypertension   . Diabetes mellitus without complication ([place])   . Hyperlipemia   . large right middle cerebral artery infarct, embolic 123XX123    a. s/p IV tPA, b. source unknown, c. loop recorder placed  . Snoring 07/03/2015   Past Surgical History  Procedure Laterality Date  . Tee without cardioversion N/A 06/07/2013    Procedure: TRANSESOPHAGEAL ECHOCARDIOGRAM (TEE);  Surgeon: [name], MD;  Location: [facility] ENDOSCOPY;  Service: Cardiovascular;  Laterality: N/A;  . Loop recorder implant  06/07/2013    MDT LinQ implanted by Dr [name]  for cryptogenic stroke  . Loop recorder implant N/A 06/07/2013    Procedure: LOOP RECORDER IMPLANT;  Surgeon: [name], MD;  Location: [place] CATH LAB;  Service: Cardiovascular;  Laterality: N/A;    Current Outpatient Prescriptions  Medication Sig Dispense Refill  . amLODipine (NORVASC) 10 MG tablet Take 1 tablet (10 mg total) by mouth daily. 30 tablet 1  . apixaban (ELIQUIS) 5 MG TABS tablet Take 1 tablet (5 mg total) by mouth 2 (two) times daily. 180 tablet 3  . cloNIDine (CATAPRES) 0.3 MG tablet Take 0.3 mg by mouth 2 (two) times daily.    . hydrALAZINE (APRESOLINE) 10 MG tablet Take 10 mg by mouth daily.     . hydrochlorothiazide (HYDRODIURIL) 25 MG tablet Take 25 mg by mouth daily.    Marland Kitchen levETIRAcetam (KEPPRA XR) 500 MG 24 hr tablet Take 2 tablets by mouth at  bedtime 180 tablet 1  . lisinopril (PRINIVIL,ZESTRIL) 40 MG tablet Take 1 tablet (40 mg total) by mouth daily. 30 tablet 1  . metFORMIN (GLUCOPHAGE-XR) 750 MG 24 hr tablet Take 750 mg by mouth 2 (two) times daily.     . potassium chloride SA (K-DUR,KLOR-CON) 20 MEQ tablet Take 20 mEq by mouth 3 (three) times daily.      No current facility-administered medications for this visit.    Allergies:   Bidil   Social History: Social History   Social History  . Marital Status: Married  Spouse Name: [name]  . Number of Children: 2  . Years of Education: College   Occupational History  . Retired    Social History Main Topics  . Smoking status: Former Research scientist (life sciences)  . Smokeless tobacco: Never Used     Comment: Quit 25 years ago.  . Alcohol Use: 0.6 oz/week    1 Glasses of wine per week     Comment: Rare  . Drug Use: No  . Sexual Activity: No   Other Topics Concern  . Not on file   Social History Narrative   Patient lives at home [name]).   Retired works part time 10 hours a week.   Right handed   Education college   Caffeine: one cup daily coffee   No soda or tea.    Family History: Family History  Problem  Relation Age of Onset  . Dementia Mother   . Diabetes Mother   . Heart attack Father     Review of Systems: All other systems reviewed and are otherwise negative except as noted above.   Physical Exam: VS:  BP 148/76 mmHg  Pulse 83  Ht 6\' 3"  (1.905 m)  Wt 235 lb (106.595 kg)  BMI 29.37 kg/m2 , BMI Body mass index is 29.37 kg/(m^2). Wt Readings from Last 3 Encounters:  07/23/15 235 lb (106.595 kg)  07/09/15 224 lb (101.606 kg)  07/01/15 220 lb (99.791 kg)   BP 148/76 mmHg  Pulse 83  Ht 6\' 3"  (1.905 m)  Wt 235 lb (106.595 kg)  BMI 29.37 kg/m2  GEN- The patient is well appearing, alert and oriented x 3 today.   HEENT: normocephalic, atraumatic; sclera clear, conjunctiva pink; hearing intact; oropharynx clear; neck supple  Lungs- Clear to ausculation bilaterally, normal work of breathing.  No wheezes, rales, rhonchi Heart- Irregular rate and rhythm  GI- soft, non-tender, non-distended, bowel sounds present  Extremities- no clubbing, cyanosis, or edema; DP/PT/radial pulses 2+ bilaterally MS- no significant deformity or atrophy Skin- warm and dry, no rash or lesion  Psych- euthymic mood, full affect Neuro- walks with the aid of cane, L sided weakness  EKG:  Today is AFib 83bpm ILR interrogation today: AFib 100% of the time, avg HR appears to be in the 60's, no brady events  06/07/13: TEE Study Conclusions - Left ventricle: Systolic function was normal. The estimated ejection fraction was in the range of 55% to 60%. Wall motion was normal; there were no regional wall motion abnormalities. - Left atrium: The atrium was dilated. No evidence of thrombus in the atrial cavity or appendage. No evidence of thrombus in the atrial cavity or appendage. No evidence of thrombus in the appendage. - Right atrium: No evidence of thrombus in the atrial cavity or appendage. - Atrial septum: No defect or patent foramen ovale was identified. Echo contrast study showed no  right-to-left atrial level shunt, following an increase in RA pressure induced by provocative maneuvers. - Tricuspid valve: No evidence of vegetation. No evidence of vegetation. - Pulmonic valve: No evidence of vegetation.  Recent Labs: 09/19/2014: ALT 13; BUN 18; Creatinine, Ser 1.00; Hemoglobin 13.9; Platelets 216; Potassium 3.3*; Sodium 137   Assessment and Plan: 1.  Persistent atrial fibrillation The patient has asymptomatic persistent atrial fibrillation Continue Eliquis for CHADS2VASC of at least 5  He reports today that his insurance is covering the Eliquis Ventricular rates are well controlled, avg appears in the 60's He sounds fairly asymptomatic from the AF, though did mention possibly less energy in general,  for now he would like to get back into his usual regime now with the weather better, we will get an echo doppler, he is interested in looking into the AF clinic as a resource and given information regarding the program, though at this time,we will schedule an echo doppler and see him back in 4 months sooner if needed.  2.  HTN Stable off of Coreg, weaned off for pauses No changes today, he is asked to keep an eye on his BP  Current medicines are reviewed at length with the patient today.   The patient does not have concerns regarding his medicines.  The following changes were made today:  none  Labs/ tests ordered today include:  Orders Placed This Encounter  Procedures  . EKG 12-Lead  . Echocardiogram    Disposition:   Venetia Night, PA-C  07/23/2015 4:45 PM   [place]  [place] [address] [phone] (office) [phone] (fax)

## 2015-07-23 ENCOUNTER — Ambulatory Visit (INDEPENDENT_AMBULATORY_CARE_PROVIDER_SITE_OTHER): Payer: Medicare Other | Admitting: Physician Assistant

## 2015-07-23 ENCOUNTER — Encounter: Payer: Self-pay | Admitting: Physician Assistant

## 2015-07-23 VITALS — BP 148/76 | HR 83 | Ht 75.0 in | Wt 235.0 lb

## 2015-07-23 DIAGNOSIS — I639 Cerebral infarction, unspecified: Secondary | ICD-10-CM

## 2015-07-23 DIAGNOSIS — I159 Secondary hypertension, unspecified: Secondary | ICD-10-CM | POA: Diagnosis not present

## 2015-07-23 DIAGNOSIS — I4891 Unspecified atrial fibrillation: Secondary | ICD-10-CM

## 2015-07-23 DIAGNOSIS — I10 Essential (primary) hypertension: Secondary | ICD-10-CM | POA: Diagnosis not present

## 2015-07-23 NOTE — Patient Instructions (Addendum)
Medication Instructions: .  Your physician recommends that you continue on your current medications as directed. Please refer to the Current Medication list given to you today.   If you need a refill on your cardiac medications before your next appointment, please call your pharmacy.  Labwork:  NONE ORDER TODAY    Testing/Procedures:  Your physician has requested that you have an echocardiogram. Echocardiography is a painless test that uses sound waves to create images of your heart. It provides your doctor with information about the size and shape of your heart and how well your heart's chambers and valves are working. This procedure takes approximately one hour. There are no restrictions for this procedure.      Follow-Up: IN 4 MONTHS WITH RENEE URSUY   Any Other Special Instructions Will Be Listed Below (If Applicable).                                                                                                                                                 \

## 2015-07-26 ENCOUNTER — Other Ambulatory Visit: Payer: Self-pay | Admitting: Adult Health

## 2015-07-30 ENCOUNTER — Encounter: Payer: Self-pay | Admitting: Internal Medicine

## 2015-07-31 ENCOUNTER — Telehealth: Payer: Self-pay | Admitting: Adult Health

## 2015-07-31 DIAGNOSIS — I69398 Other sequelae of cerebral infarction: Principal | ICD-10-CM

## 2015-07-31 DIAGNOSIS — I69359 Hemiplegia and hemiparesis following cerebral infarction affecting unspecified side: Secondary | ICD-10-CM

## 2015-07-31 NOTE — Telephone Encounter (Signed)
Pt called and is requesting a referral for OT on his left shoulder. He has PT referral to [facility]. Thank you

## 2015-08-01 NOTE — Telephone Encounter (Signed)
Pt has appt 08-07-15 for PT evaluation, can we add OT to this as well?

## 2015-08-01 NOTE — Telephone Encounter (Signed)
The patient has a left HP from a prior stroke, I will order the OT.

## 2015-08-02 DIAGNOSIS — L812 Freckles: Secondary | ICD-10-CM | POA: Diagnosis not present

## 2015-08-02 DIAGNOSIS — D225 Melanocytic nevi of trunk: Secondary | ICD-10-CM | POA: Diagnosis not present

## 2015-08-02 DIAGNOSIS — D1801 Hemangioma of skin and subcutaneous tissue: Secondary | ICD-10-CM | POA: Diagnosis not present

## 2015-08-03 ENCOUNTER — Ambulatory Visit (HOSPITAL_COMMUNITY): Payer: Medicare Other | Attending: Cardiology

## 2015-08-03 ENCOUNTER — Other Ambulatory Visit: Payer: Self-pay

## 2015-08-03 ENCOUNTER — Ambulatory Visit (INDEPENDENT_AMBULATORY_CARE_PROVIDER_SITE_OTHER): Payer: Medicare Other | Admitting: *Deleted

## 2015-08-03 DIAGNOSIS — I358 Other nonrheumatic aortic valve disorders: Secondary | ICD-10-CM | POA: Insufficient documentation

## 2015-08-03 DIAGNOSIS — I4891 Unspecified atrial fibrillation: Secondary | ICD-10-CM | POA: Diagnosis not present

## 2015-08-03 DIAGNOSIS — I639 Cerebral infarction, unspecified: Secondary | ICD-10-CM | POA: Diagnosis not present

## 2015-08-03 DIAGNOSIS — Z87891 Personal history of nicotine dependence: Secondary | ICD-10-CM | POA: Insufficient documentation

## 2015-08-03 DIAGNOSIS — I350 Nonrheumatic aortic (valve) stenosis: Secondary | ICD-10-CM | POA: Diagnosis not present

## 2015-08-03 DIAGNOSIS — I340 Nonrheumatic mitral (valve) insufficiency: Secondary | ICD-10-CM | POA: Diagnosis not present

## 2015-08-03 DIAGNOSIS — I071 Rheumatic tricuspid insufficiency: Secondary | ICD-10-CM | POA: Diagnosis not present

## 2015-08-03 DIAGNOSIS — E785 Hyperlipidemia, unspecified: Secondary | ICD-10-CM | POA: Insufficient documentation

## 2015-08-03 DIAGNOSIS — I119 Hypertensive heart disease without heart failure: Secondary | ICD-10-CM | POA: Insufficient documentation

## 2015-08-03 DIAGNOSIS — E119 Type 2 diabetes mellitus without complications: Secondary | ICD-10-CM | POA: Diagnosis not present

## 2015-08-03 DIAGNOSIS — Z8249 Family history of ischemic heart disease and other diseases of the circulatory system: Secondary | ICD-10-CM | POA: Insufficient documentation

## 2015-08-03 DIAGNOSIS — I313 Pericardial effusion (noninflammatory): Secondary | ICD-10-CM | POA: Diagnosis not present

## 2015-08-03 NOTE — Progress Notes (Signed)
Carelink Summary Report / Loop Recorder 

## 2015-08-06 ENCOUNTER — Telehealth: Payer: Self-pay | Admitting: Physician Assistant

## 2015-08-06 ENCOUNTER — Telehealth: Payer: Self-pay | Admitting: *Deleted

## 2015-08-06 NOTE — Telephone Encounter (Signed)
Returning your call. °

## 2015-08-06 NOTE — Telephone Encounter (Signed)
-----   Message from [facility] [place] sent at 08/06/2015  9:09 AM EST ----- Please let the patient know that his heart muscle function is strong.  The thickness of his heart muscle is slightly increased likely from HTN.  No changes to his medicines, to monitor his BP at home please and we will re-evaluate his BP control and therapy at his next visit.  Thanks, Tommye Standard, PA-c

## 2015-08-07 ENCOUNTER — Ambulatory Visit: Payer: Medicare Other | Admitting: Physical Therapy

## 2015-08-07 ENCOUNTER — Telehealth: Payer: Self-pay | Admitting: *Deleted

## 2015-08-07 NOTE — Telephone Encounter (Signed)
Returning your call from yesterday. °

## 2015-08-07 NOTE — Telephone Encounter (Signed)
SPOKE TO PT ABOUT RESULTS AND ABOUT CONTACTING BACK TO SCHEDULE AN FOLLOW UP APPT.

## 2015-08-07 NOTE — Telephone Encounter (Signed)
MADE APPT

## 2015-08-08 ENCOUNTER — Encounter: Payer: Self-pay | Admitting: Physical Therapy

## 2015-08-08 ENCOUNTER — Ambulatory Visit: Payer: Medicare Other | Admitting: Physical Therapy

## 2015-08-08 ENCOUNTER — Encounter: Payer: Self-pay | Admitting: Occupational Therapy

## 2015-08-08 ENCOUNTER — Ambulatory Visit: Payer: Medicare Other | Attending: Neurology | Admitting: Occupational Therapy

## 2015-08-08 DIAGNOSIS — R2681 Unsteadiness on feet: Secondary | ICD-10-CM

## 2015-08-08 DIAGNOSIS — R279 Unspecified lack of coordination: Secondary | ICD-10-CM | POA: Insufficient documentation

## 2015-08-08 DIAGNOSIS — I69354 Hemiplegia and hemiparesis following cerebral infarction affecting left non-dominant side: Secondary | ICD-10-CM | POA: Diagnosis not present

## 2015-08-08 DIAGNOSIS — R208 Other disturbances of skin sensation: Secondary | ICD-10-CM | POA: Diagnosis not present

## 2015-08-08 DIAGNOSIS — R269 Unspecified abnormalities of gait and mobility: Secondary | ICD-10-CM | POA: Insufficient documentation

## 2015-08-08 DIAGNOSIS — I69998 Other sequelae following unspecified cerebrovascular disease: Secondary | ICD-10-CM

## 2015-08-08 DIAGNOSIS — I69359 Hemiplegia and hemiparesis following cerebral infarction affecting unspecified side: Secondary | ICD-10-CM

## 2015-08-08 DIAGNOSIS — I6980 Unspecified sequelae of other cerebrovascular disease: Secondary | ICD-10-CM | POA: Insufficient documentation

## 2015-08-08 DIAGNOSIS — IMO0002 Reserved for concepts with insufficient information to code with codable children: Secondary | ICD-10-CM

## 2015-08-08 DIAGNOSIS — I69898 Other sequelae of other cerebrovascular disease: Secondary | ICD-10-CM | POA: Diagnosis not present

## 2015-08-08 DIAGNOSIS — M25872 Other specified joint disorders, left ankle and foot: Secondary | ICD-10-CM | POA: Diagnosis not present

## 2015-08-08 DIAGNOSIS — R209 Unspecified disturbances of skin sensation: Secondary | ICD-10-CM

## 2015-08-08 NOTE — Therapy (Signed)
[facility] [address] [place], [place], [address] Phone: [phone]   Fax:  [phone]  Physical Therapy Evaluation  Patient Details  Name: Donald [name] MRN: [id number] Date of Birth: [birth year] Referring Provider: Ernestine Mcmurray, NP  Encounter Date: 08/08/2015      PT End of Session - 08/08/15 2024    Visit Number 1   Number of Visits 13   Date for PT Re-Evaluation 09/22/15   Authorization Type Medicare primary; [employer] secondary - G Codes and PN required every 10 visits   PT Start Time 1450   PT Stop Time 1530   PT Time Calculation (min) 40 min   Activity Tolerance Patient tolerated treatment well   Behavior During Therapy [facility] for tasks assessed/performed      Past Medical History  Diagnosis Date  . Hypertension   . Diabetes mellitus without complication ([place])   . Hyperlipemia   . large right middle cerebral artery infarct, embolic [id number]    a. s/p IV tPA, b. source unknown, c. loop recorder placed  . Snoring 07/03/2015    Past Surgical History  Procedure Laterality Date  . Tee without cardioversion N/A 06/07/2013    Procedure: TRANSESOPHAGEAL ECHOCARDIOGRAM (TEE);  Surgeon: [name], MD;  Location: [facility] ENDOSCOPY;  Service: Cardiovascular;  Laterality: N/A;  . Loop recorder implant  06/07/2013    MDT LinQ implanted by Dr [name] for cryptogenic stroke  . Loop recorder implant N/A 06/07/2013    Procedure: LOOP RECORDER IMPLANT;  Surgeon: [name], MD;  Location: [facility] CATH LAB;  Service: Cardiovascular;  Laterality: N/A;    There were no vitals filed for this visit.  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) - Plan: PT plan of care cert/re-cert  Decreased proprioception of joint of foot, left - Plan: PT plan of care cert/re-cert  Lack of coordination due to  stroke - Plan: PT plan of care cert/re-cert  Unsteadiness - Plan: PT plan of care cert/re-cert      Subjective Assessment - 08/08/15 1501    Subjective "I just went for my 88-month neuro check-up, and when I walked down the hall, the PA said it looked like I might be 'flipping' my left foot a little bit."   Patient is accompained by: Family member  wife, Donald [name]   Pertinent History Pt likes to be called "Donald [name]".   PMH significant for: CVA (large R MCA infarct, embolic),  seizures, HTN, HLD, DM, persistent A-Fib   Patient Stated Goals Work on walking downstairs, work on walking technique to make it better, and to work on my reaction time when I lose my balance to my left side.   Currently in Pain? No/denies            [facility] PT Assessment - 08/08/15 0001    Assessment   Medical Diagnosis abnormality of gait; history of CVA   Referring Provider Ernestine Mcmurray, NP   Onset Date/Surgical Date 07/04/13   Hand Dominance Right   Precautions   Precautions Fall;Other (comment)   Precaution Comments last seizure was 09/2014  has loop recorder   Restrictions   Weight Bearing Restrictions No   Balance Screen   Has the patient fallen in the past 6 months Yes   How many times? 1   Has the patient had a decrease in activity level because of a fear  of falling?  No  wife is fearful of pt falling   Is the patient reluctant to leave their home because of a fear of falling?  No   Home Environment   Living Environment Private residence   Living Arrangements Spouse/significant other   Type of [place] to enter   Entrance Stairs-Number of Steps 2   Entrance Stairs-Rails None   Home Layout One level   [place] - single point   Prior Function   Level of [place] Retired   Leisure spending time with 8 grandchildren   Cognition   Overall Cognitive Status Within Functional Limits for tasks assessed   Sensation   Light Touch Impaired by  gross assessment;Appears Intact  in BLE's   Proprioception Impaired Detail   Proprioception Impaired Details Impaired LLE   Additional Comments Hypersensitivity in lateral aspect of LLE   Coordination   Gross Motor Movements are Fluid and Coordinated No   Coordination and Movement Description Limited selective control of L knee flexion with concurrent L hip flexion and decreased control of L ankle DF/eversion with L knee flexion/extension, both of which cause LLE circumduction of LLE during gait.   Heel Shin Test Mild limitation in quality and excursion of movement in LLE.    Tone   Assessment Location Left Lower Extremity   ROM / Strength   AROM / PROM / Strength Strength   Strength   Overall Strength Deficits   Overall Strength Comments Grossly WFL in BLE's, with exception of final 30 degrees of L knee extension (4/5); will further assess B hip extension and ABD in future session.   Flexibility   Soft Tissue Assessment /Muscle Length yes   Hamstrings Limited extensibility in L hamstrings   Transfers   Transfers Sit to Stand;Stand to Sit   Sit to Stand 6: Modified independent (Device/Increase time)   Stand to Sit 6: Modified independent (Device/Increase time)   Ambulation/Gait   Ambulation/Gait Yes   Ambulation/Gait Assistance 6: Modified independent (Device/Increase time)   Ambulation Distance (Feet) 200 Feet   Assistive device Straight cane   Gait Pattern Step-through pattern;Decreased arm swing - left;Decreased step length - left;Decreased hip/knee flexion - left;Decreased dorsiflexion - left;Left circumduction   Gait velocity 1.79 ft/sec  < 1.8 ft/sec suggestive of recurrent fall risk   Stairs Yes   Stairs Assistance 5: Supervision   Stairs Assistance Details (indicate cue type and reason) Ascended x4 stairs with single R rail with alternating pattern. Subsequent trial x4 stairs without rails using SPC with close (S), ascending with reciprocal pattern and descending with step-to  pattern.   Stair Management Technique Forwards;One rail Right;With cane;Alternating pattern;Step to pattern   Number of Stairs 8   Height of Stairs 6   Door Management 5: Supervision   Door Managment Details (indicate cue type and reason) using SPC   Gait Comments During gait, noted LLE extensor synergy characterized by L knee extension, L hip adduction,  limited L ankle DF (to approx. neutral), L forefoot supination.   Functional Gait  Assessment   Gait assessed  --   LLE Tone   LLE Tone Other (comment)  LLE extensor synergy during gait; no velocity-dependent tone                   [facility] Adult PT Treatment/Exercise - 08/08/15 0001    Ambulation/Gait   Ambulation Surface Level;Indoor  PT Education - 08/08/15 1906    Education provided Yes   Education Details PT eval findings, goals, and POC.   Person(s) Educated Patient;Spouse   Methods Explanation   Comprehension Verbalized understanding          PT Short Term Goals - 08/08/15 2002    PT SHORT TERM GOAL #1   Title Pt will perform initial HEP with mod I using paper handout to indicate safe daily compliance with home program.   (Target date: 07/01/15)   PT SHORT TERM GOAL #2   Title Complete FGA and improve score by 4 points to indicate improved dynamic gait stability.   (Target date: 07/01/15)   PT SHORT TERM GOAL #3   Title Pt will improve gait velocity from 1.79 ft/sec to > / = 2.09 ft/sec to indicate decreased risk of recurrent falls.   (Target date: 07/01/15)   PT SHORT TERM GOAL #4   Title Pt will negotiate 2 stairs without rails with mod I using LRAD to indicate increased independence using primary home entrance.  (Target date: 07/01/15)   PT SHORT TERM GOAL #5   Title Pt will ambulate > 300' over level, indoor surfaces without AFO with no evidence of L ankle instability (excessive forefoot supination) to indicate increased safety with ambulation.   (Target date: 07/01/15)   Additional Short  Term Goals   Additional Short Term Goals Yes   PT SHORT TERM GOAL #6   Title Pt will ambulate 50' over grass surfaces with supervision using LRAD to increase pt safety attending grandson's soccer games.   (Target date: 07/01/15)   PT SHORT TERM GOAL #7   Title Pt will perform floor transfer (supine on floor > standing) with min A without use of external aid to indicate increased safety with fall recovery.   (Target date: 07/01/15)           PT Long Term Goals - 08/08/15 2010    PT LONG TERM GOAL #1   Title Pt will improve gait velocity from 1.79 ft/sec to > / = 2.39 ft/sec to indicate increased efficiency of ambulation.   (Target date: 09/19/15)   PT LONG TERM GOAL #2   Title Pt will score > / = 19/30 on FGA to indicate decreased fall risk.   (Target date: 09/19/15)   PT LONG TERM GOAL #3   Title Pt will ambulate > 500' over unlevel, paved surfaces without AFO with mod I using LRAD with no L ankle instability to indicate safety with community mobility.  (Target date: 09/19/15)   PT LONG TERM GOAL #4   Title Pt will ambulate 57' over unlevel, grass surfaces with mod I using LRAD to indicate increased independence attending grandson's soccer games.    (Target date: 09/19/15)   PT LONG TERM GOAL #5   Title Pt will perform floor transfer (supine on floor > standing) with supervision without use of external aid to indicate increased safety with fall recovery.   (Target date: 09/19/15)   Additional Long Term Goals   Additional Long Term Goals Yes   PT LONG TERM GOAL #6   Title Pt will negotiate 4 stairs without rails with mod I using LRAD to indicate increased access to community.   (Target date: 09/19/15)               Plan - 08/08/15 1936    Clinical Impression Statement Pt is a 70 y/o M referred to outpatient neuro PT to address gait and balance  impairments associated with embolic MCA infarct ([id number]) with remaining spastic L hemiparesis and L hemisensory deficits. PMH significant for: CVA  (large R MCA infarct, embolic), seizure, HTN, HLD, DM, persistent A-Fib. PT evaluation reveals the following impairments: gait impairments, L hemiparesis, LLE hypertonicity (externsor synergy), decreased selective control in LLE, L ankle instability during ambulation, decreased stability/independence with community gait and stair negotiation, impaired sensation and propoiception in LLE, gait velocity suggestive of increased risk of recurrent falls. Pt will benefit from skilled outpatient PT 2xw/week for up to 6 weeks to address said impairments.    Pt will benefit from skilled therapeutic intervention in order to improve on the following deficits Abnormal gait;Decreased balance;Decreased coordination;Increased edema;Impaired flexibility;Impaired sensation;Impaired tone;Decreased strength   Rehab Potential Good   Clinical Impairments Affecting Rehab Potential Time since CVA (> 2 years)   PT Frequency 2x / week   PT Duration 6 weeks   PT Treatment/Interventions ADLs/Self Care Home Management;DME Instruction;Gait training;Stair training;Functional mobility training;Therapeutic activities;Therapeutic exercise;Balance training;Orthotic Fit/Training;Patient/family education;Neuromuscular re-education;Vestibular   PT Next Visit Plan Perform FGA; initiate HEP with emphasis on selective control of L knee flexion with L hip flexion   Consulted and Agree with Plan of Care Patient;Family member/caregiver   Family Member Consulted wife, Donald [name] - August 22, 2015 1941    Functional Assessment Tool Used gait velocity = 1.79 ft/sec (normal gait velocity = 4.37 ft/sec)   Functional Limitation Mobility: Walking and moving around   Mobility: Walking and Moving Around Current Status [phone]) At least 40 percent but less than 60 percent impaired, limited or restricted   Mobility: Walking and Moving Around Goal Status [phone]) At least 20 percent but less than 40 percent impaired, limited or restricted        Problem List Patient Active Problem List   Diagnosis Date Noted  . Snoring 07/03/2015  . Persistent atrial fibrillation ([place]) 04/16/2015  . Hypokalemia 09/20/2014  . HTN (hypertension) 09/19/2014  . HLD (hyperlipidemia) 09/19/2014  . Diabetes mellitus without complication ([place]) [id number]  . Jerking 09/19/2014  . Alterations of sensations, late effect of cerebrovascular disease 11/07/2013  . Disturbances of vision, late effect of cerebrovascular disease 11/07/2013  . Sinus bradycardia 08/08/2013  . 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[place]) 07/26/2013  . Left-sided neglect 07/26/2013  . Small vessel disease ([place]) 06/07/2013  . Obesity, unspecified 06/07/2013  . CVA (cerebral infarction) 06/07/2013  . large right middle cerebral artery infarct, embolic [id number]  . Hypertension 06/03/2013  . Diabetes ([place]) 06/03/2013   Billie Ruddy, PT, DPT [facility] [address] [place], [place], [address] Phone: [phone]   Fax:  [phone] 08/22/15, 8:27 PM   Name: Donald [name] MRN: [id number] Date of Birth: February 08, 1946

## 2015-08-09 NOTE — Therapy (Signed)
[facility] [address] [place], [place], [address] Phone: [phone]   Fax:  [phone]  Occupational Therapy Evaluation  Patient Details  Name: Donald [name] MRN: [id number] Date of Birth: 15-Sep-1945 Referring Provider: Dr. Margette Fast  Encounter Date: 08/08/2015      OT End of Session - 08/09/15 1052    Visit Number 1   Number of Visits 17   Date for OT Re-Evaluation 10/03/15   Authorization Type Medicare   Authorization Time Period Progress note / gcode 10th visit   Authorization - Visit Number 1   Authorization - Number of Visits 10   OT Start Time [id number]   OT Stop Time 1630   OT Time Calculation (min) 57 min   Activity Tolerance Patient tolerated treatment well   Behavior During Therapy [facility] for tasks assessed/performed      Past Medical History  Diagnosis Date  . Hypertension   . Diabetes mellitus without complication ([place])   . Hyperlipemia   . large right middle cerebral artery infarct, embolic [id number]    a. s/p IV tPA, b. source unknown, c. loop recorder placed  . Snoring 07/03/2015    Past Surgical History  Procedure Laterality Date  . Tee without cardioversion N/A 06/07/2013    Procedure: TRANSESOPHAGEAL ECHOCARDIOGRAM (TEE);  Surgeon: [name], MD;  Location: [facility] ENDOSCOPY;  Service: Cardiovascular;  Laterality: N/A;  . Loop recorder implant  06/07/2013    MDT LinQ implanted by Dr [name] for cryptogenic stroke  . Loop recorder implant N/A 06/07/2013    Procedure: LOOP RECORDER IMPLANT;  Surgeon: [name], MD;  Location: [facility] CATH LAB;  Service: Cardiovascular;  Laterality: N/A;    There were no vitals filed for this visit.  Visit Diagnosis:  Hemiplegia and hemiparesis following cerebral infarction affecting left non-dominant side ([place]) - Plan: Ot plan of care cert/re-cert  Sensation alteration, late effect of cerebrovascular disease - Plan: Ot plan of care cert/re-cert  Lack  of coordination due to stroke - Plan: Ot plan of care cert/re-cert  Unsteadiness on feet - Plan: Ot plan of care cert/re-cert      Subjective Assessment - 08/08/15 1554    Subjective  I have stiffness in my left shoulder - not truly pain   Patient is accompained by: Family member  wife Donald [name]   Pertinent History Afib, seizure 09/2014,    Patient Stated Goals Play guitar    Currently in Pain? No/denies           [facility] OT Assessment - 08/08/15 1557    Assessment   Diagnosis Right MCA CVA   Referring Provider Dr. Margette Fast   Onset Date 06/02/13   Precautions   Precautions Fall   Precaution Comments last seizure was 09/2014   Restrictions   Weight Bearing Restrictions No   Balance Screen   Has the patient fallen in the past 6 months No   Has the patient had a decrease in activity level because of a fear of falling?  Yes   Is the patient reluctant to leave their home because of a fear of falling?  No   Home  Environment   Family/patient expects to be discharged to: Private residence   Living Arrangements Spouse/significant other   Available Help at [place];Cane - single point   Lives With Spouse   Prior Function   Level of Independence Independent;Independent with basic ADLs  Vocation Retired   Leisure spending time with 8 grandchildren   ADL   Eating/Feeding Independent   Grooming Independent   Chartered certified accountant Dressing Increased time   Lower Body Dressing Increased time   Materials engineer Independent;Modified independent   Toileting - Printmaker Details (indicate cue type and reason --  Wife present for shower transfer   Production manager with back   Equipment Used Cane   ADL comments Patient able to complete basic ADL with increased  time, and occasional assistance from wife.  Patient recently able to use left hand to tie shoes. Patient does not wear AFO.   IADL   Prior Level of Function Shopping --  Public librarian for transportation   Prior Level of Function Light Housekeeping independent   Light Housekeeping Performs light daily tasks such as dishwashing, bed making   Prior Level of Function Meal Prep independent   Meal Prep Able to complete simple cold meal and snack prep   Prior Level of Function Community Mobility independent   Community Mobility Relies on family or friends for transportation  uses cane in community   Written Expression   Dominant Hand Right   Written Experience Within Functional Limits   Vision - History   Baseline Vision Wears glasses all the time  progressive lenses   Vision Assessment   Eye Alignment Within Functional Limits   Vision Assessment Vision not tested   Ocular Range of Motion Within Functional Limits   Alignment/Gaze Preference Within Defined Limits   Visual Fields No apparent deficits   Cognition   Overall Cognitive Status History of cognitive impairments - at baseline   Attention Selective;Alternating   Selective Attention Impaired   Selective Attention Impairment Verbal complex;Functional complex   Alternating Attention Impaired   Alternating Attention Impairment Verbal complex;Functional complex   Memory Appears intact   Awareness Appears intact   Problem Solving Appears intact   Observation/Other Assessments   Focus on Therapeutic Outcomes (FOTO)  --  status incomplete   Sensation   Light Touch Appears Intact   Stereognosis Not tested   Hot/Cold Impaired by gross assessment   Proprioception Impaired by gross assessment   Proprioception Impaired Details Impaired LUE   Additional Comments hypersensitivity to cold left lateral UE, trunk, and tips of fingers   Coordination   Gross Motor Movements are Fluid and Coordinated No   Fine Motor  Movements are Fluid and Coordinated No   Coordination and Movement Description Dysmetric movement, poorly graded - excessive force   Finger Nose Finger Test Undershoots - more pronounced with eyes closed   9 Hole Peg Test Right;Left   Right 9 Hole Peg Test 30.88   Left 9 Hole Peg Test 1.28.60   Other Proximal weakness, and stiffness - overactive and poorly graded distal flexion control   Perception   Perception Within Functional Limits   Praxis   Praxis Intact   Tone   Assessment Location Left Upper Extremity   ROM / Strength   AROM / PROM / Strength AROM   AROM   Overall AROM  Deficits   Overall AROM Comments Shoulder limited to 75% flexion/ abduction, excessive trunk / neck motion as well to achieve   Strength   Overall Strength Deficits   Overall Strength Comments Proximal weakness in shoulder stabilizers / decreased muscle balance around shoulder  girdle   LUE Tone   LUE Tone Mild;Modified Ashworth   LUE Tone   Modified Ashworth Scale for Grading Hypertonia LUE Slight increase in muscle tone, manifested by a catch, followed by minimal resistance throughout the remainder (less than half) of the ROM                         OT Education - 08/09/15 1051    Education provided Yes   Education Details OT eval findings and potential goals of OT. Importance of use of left UE to further enhance functional ability   Person(s) Educated Patient;Spouse   Methods Explanation   Comprehension Verbalized understanding          OT Short Term Goals - 08/09/15 1102    OT SHORT TERM GOAL #1   Title Patient will complete home exercise program independently due 09/13/15   Time 4   Period Weeks   Status New   OT SHORT TERM GOAL #2   Title Patient will demonstrate improved coordination in left hand as evidenced by decrease time on 9 hole peg test by 10 seconds   Baseline 1 min 28 seconds (goal 1 min 18 sec)   Time 4   Period Weeks   Status New   OT SHORT TERM GOAL #3    Title Patient will demonstrate ability to support neck of guitar with left hand while seated, and demonstrate adequate finger control to form 1-2 cord positions with increased time   Time 4   Period Weeks   Status New   OT SHORT TERM GOAL #4   Title Patient will adequately use bilateral hands to slice a soft fruit or vegetable (tomatao) without incident with minimal environmental set up and cueing   Time 4   Period Weeks   Status New   OT SHORT TERM GOAL #5   Title Patient will demonstrate adequate strength and control of left UE to lift an object weighing up to 3 lb weight into overhead shelf    Time 4   Period Weeks   Status New           OT Long Term Goals - 08/09/15 1108    OT LONG TERM GOAL #1   Title Patient will complete update home exercise / home activity program independently (due 10/03/15)   Time 8   Period Weeks   Status New   OT LONG TERM GOAL #2   Title Patient will demonstrate improved fine motor coordination in left UE as evidenced by 20 second reduction in time on 9 hole peg test   Baseline 58min 28 sec :    Goal 1 min 8 sec or less   Time 8   Period Weeks   Status New   OT LONG TERM GOAL #3   Title Patient will adjust left hand position on neck of guitar for 3-4 chord changes in time with simple song.   Time 8   Period Weeks   Status New   OT LONG TERM GOAL #4   Title Patient will unload dishwasher lifting 2-3 plates at once (or comparable weight)  into overhead shelf    Time 8   Period Weeks   Status New   OT LONG TERM GOAL #5   Title Patient will carry beverage in left hand  while walking in community at least 150 feet,using cane in right hand in minimally crowded / congested environment  Plan - August 29, 2015 1053    Clinical Impression Statement Patient is a 70 year old man with history of RMCA stroke in 2015, patient with more recent seizure - placed on Keppra, and diagnosis of Afib.  Patient is returning for OT assessment today n the  hopes of furthering his left arm function.  Patient demonstrates left hemiparesis, decreased sensation left side,  decreased strengthin shoulder stabilizers, decreased passive and active range of motion in left shoulder, and significant muscle imbalance which limits functional use of left UE at this time. Patient will benefit from skilled OT intervention to improve patient's functional ability with left UE to return to IADL, and leisure interests.     Pt will benefit from skilled therapeutic intervention in order to improve on the following deficits (Retired) Decreased activity tolerance;Decreased balance;Decreased cognition;Decreased coordination;Decreased range of motion;Decreased mobility;Decreased strength;Difficulty walking;Impaired perceived functional ability;Improper body mechanics;Impaired UE functional use;Impaired tone;Impaired sensation   Rehab Potential Good   OT Frequency 2x / week   OT Duration 8 weeks   OT Treatment/Interventions Self-care/ADL training;Ultrasound;Therapeutic exercise;Neuromuscular education;Manual Therapy;Functional Mobility Training;DME and/or AE instruction;Splinting;Therapeutic exercises;Therapeutic activities;Balance training;Patient/family education   Plan Review goals with patient, begin HEP for shoulder stretch and activation - grading fine motor activity   Consulted and Agree with Plan of Care Patient;Family member/caregiver   Family Member Consulted wife Tristan Schroeder - 2015/08/29 1115    Functional Limitation Carrying, moving and handling objects   Carrying, Moving and Handling Objects Current Status [id number]) At least 40 percent but less than 60 percent impaired, limited or restricted   Carrying, Moving and Handling Objects Goal Status [id number]) At least 20 percent but less than 40 percent impaired, limited or restricted      Problem List Patient Active Problem List   Diagnosis Date Noted  . Snoring 07/03/2015  . Persistent atrial fibrillation  ([place]) 04/16/2015  . Hypokalemia 09/20/2014  . HTN (hypertension) 09/19/2014  . HLD (hyperlipidemia) 09/19/2014  . Diabetes mellitus without complication ([place]) [id number]  . Jerking 09/19/2014  . Alterations of sensations, late effect of cerebrovascular disease 11/07/2013  . Disturbances of vision, late effect of cerebrovascular disease 11/07/2013  . Sinus bradycardia 2013-08-28  . 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[place]) 07/26/2013  . Left-sided neglect 07/26/2013  . Small vessel disease ([place]) 06/07/2013  . Obesity, unspecified 06/07/2013  . CVA (cerebral infarction) 06/07/2013  . large right middle cerebral artery infarct, embolic [id number]  . Hypertension 06/03/2013  . Diabetes ([place]) 06/03/2013    Mariah Milling, OTR/L 08/29/2015, 11:20 AM  [facility] [address] [place], [place], [address] Phone: [phone]   Fax:  [phone]  Name: Donald [name] MRN: [id number] Date of Birth: 1945/10/15

## 2015-08-10 LAB — CUP PACEART REMOTE DEVICE CHECK: Date Time Interrogation Session: 20170303090548

## 2015-08-10 NOTE — Progress Notes (Signed)
Carelink summary report received. Battery status OK. Normal device function. No new symptom episodes, tachy episodes, brady, or pause episodes. 100% AF, +Eliquis. V rates controlled.  Monthly summary reports and ROV/PRN

## 2015-08-14 ENCOUNTER — Ambulatory Visit: Payer: Medicare Other | Admitting: Occupational Therapy

## 2015-08-15 ENCOUNTER — Ambulatory Visit: Payer: Medicare Other | Admitting: Physical Therapy

## 2015-08-15 ENCOUNTER — Encounter: Payer: Self-pay | Admitting: Physical Therapy

## 2015-08-15 DIAGNOSIS — R2681 Unsteadiness on feet: Secondary | ICD-10-CM | POA: Diagnosis not present

## 2015-08-15 DIAGNOSIS — R208 Other disturbances of skin sensation: Secondary | ICD-10-CM | POA: Diagnosis not present

## 2015-08-15 DIAGNOSIS — I69898 Other sequelae of other cerebrovascular disease: Secondary | ICD-10-CM | POA: Diagnosis not present

## 2015-08-15 DIAGNOSIS — I69354 Hemiplegia and hemiparesis following cerebral infarction affecting left non-dominant side: Secondary | ICD-10-CM | POA: Diagnosis not present

## 2015-08-15 DIAGNOSIS — R279 Unspecified lack of coordination: Secondary | ICD-10-CM | POA: Diagnosis not present

## 2015-08-15 DIAGNOSIS — R269 Unspecified abnormalities of gait and mobility: Secondary | ICD-10-CM

## 2015-08-15 DIAGNOSIS — I6980 Unspecified sequelae of other cerebrovascular disease: Secondary | ICD-10-CM | POA: Diagnosis not present

## 2015-08-15 NOTE — Patient Instructions (Signed)
Heel Slide    With towel under heel, bend left knee and pull heel toward buttocks. Hold __3__ seconds. Return. Repeat __10__ times. Do __1-2__ sessions per day.  http://gt2.exer.us/371    Copyright  VHI. All rights reserved.  SIT TO STAND: Feet in Modified Tandem    Place right foot in front of the left. Lean chest forward. Raise hips and straighten knees to stand. _10__ reps per set, _1-2__ sets per day, _7__ days per week Hold onto a support. Copyright  VHI. All rights reserved.  Chair Sitting    Sit at edge of seat, spine straight, left leg extended. Put a hand on each thigh and bend forward from the hip, keeping spine straight. Allow hand on extended leg to reach toward toes. Support upper body with other arm. Hold _30__ seconds. Repeat _2__ times per session. Do _1-2__ sessions per day.  Copyright  VHI. All rights reserved.

## 2015-08-15 NOTE — Therapy (Signed)
[facility] [address] [place], [place], [address] Phone: [phone]   Fax:  [phone]  Physical Therapy Treatment  Patient Details  Name: Donald [name] MRN: [id number] Date of Birth: 10/16/1945 Referring Provider: Ernestine Mcmurray, NP  Encounter Date: 08/15/2015      PT End of Session - 08/15/15 1653    Visit Number 2   Number of Visits 13   Date for PT Re-Evaluation 09/22/15   Authorization Type Medicare primary; [employer] secondary - G Codes and PN required every 10 visits   PT Start Time 1446   PT Stop Time 1531   PT Time Calculation (min) 45 min   Equipment Utilized During Treatment Gait belt   Activity Tolerance Patient tolerated treatment well   Behavior During Therapy [facility] for tasks assessed/performed      Past Medical History  Diagnosis Date  . Hypertension   . Diabetes mellitus without complication ([place])   . Hyperlipemia   . large right middle cerebral artery infarct, embolic [id number]    a. s/p IV tPA, b. source unknown, c. loop recorder placed  . Snoring 07/03/2015    Past Surgical History  Procedure Laterality Date  . Tee without cardioversion N/A 06/07/2013    Procedure: TRANSESOPHAGEAL ECHOCARDIOGRAM (TEE);  Surgeon: [name], MD;  Location: [facility] ENDOSCOPY;  Service: Cardiovascular;  Laterality: N/A;  . Loop recorder implant  06/07/2013    MDT LinQ implanted by Dr [name] for cryptogenic stroke  . Loop recorder implant N/A 06/07/2013    Procedure: LOOP RECORDER IMPLANT;  Surgeon: [name], MD;  Location: [facility] CATH LAB;  Service: Cardiovascular;  Laterality: N/A;    There were no vitals filed for this visit.  Visit Diagnosis:  Abnormality of gait  Unsteadiness      Subjective Assessment - 08/15/15 1448    Subjective No new falls. Patient reports feeling well despite not sleeping well last night.    Patient is accompained by: Family member  wife, Donald [name]   Pertinent  History Pt likes to be called "[name]".   PMH significant for: CVA (large R MCA infarct, embolic),  seizures, HTN, HLD, DM, persistent A-Fib   Patient Stated Goals Work on walking downstairs, work on walking technique to make it better, and to work on my reaction time when I lose my balance to my left side.   Currently in Pain? No/denies          [facility] PT Assessment - 08/15/15 1457    Functional Gait  Assessment   Gait Level Surface Walks 20 ft, slow speed, abnormal gait pattern, evidence for imbalance or deviates 10-15 in outside of the 12 in walkway width. Requires more than 7 sec to ambulate 20 ft.   Change in Gait Speed Able to change speed, demonstrates mild gait deviations, deviates 6-10 in outside of the 12 in walkway width, or no gait deviations, unable to achieve a major change in velocity, or uses a change in velocity, or uses an assistive device.   Gait with Horizontal Head Turns Performs head turns smoothly with slight change in gait velocity (eg, minor disruption to smooth gait path), deviates 6-10 in outside 12 in walkway width, or uses an assistive device.   Gait with Vertical Head Turns Performs task with severe disruption of gait (eg, staggers 15 in outside 12 in walkway width, loses balance, stops, reaches for wall).   Gait and Pivot Turn Pivot turns safely in greater than 3 sec and  stops with no loss of balance, or pivot turns safely within 3 sec and stops with mild imbalance, requires small steps to catch balance.   Step Over Obstacle Is able to step over one shoe box (4.5 in total height) but must slow down and adjust steps to clear box safely. May require verbal cueing.   Gait with Narrow Base of Support Ambulates less than 4 steps heel to toe or cannot perform without assistance.   Gait with Eyes Closed Walks 20 ft, slow speed, abnormal gait pattern, evidence for imbalance, deviates 10-15 in outside 12 in walkway width. Requires more than 9 sec to ambulate 20 ft.   Ambulating  Backwards Walks 20 ft, slow speed, abnormal gait pattern, evidence for imbalance, deviates 10-15 in outside 12 in walkway width.   Steps Two feet to a stair, must use rail.   Total Score 11      Therapeutic Exercise - Patient issued HEP and performed seated hamstring stretch (3 reps of 30 second holds), supine heel slides, and sit to stand transfer with R foot out in front with 1 set of 10 reps each with verbal cues for technique. Handout issued.       PT Short Term Goals - 08/16/15 1543    PT SHORT TERM GOAL #1   Title Pt will perform initial HEP with mod I using paper handout to indicate safe daily compliance with home program.   (Target date: 07/01/15)   PT SHORT TERM GOAL #2   Title Complete FGA and improve score by 4 points to indicate improved dynamic gait stability.   (Target date: 07/01/15)   Baseline 08/15/15: baseline 11/30 today.   PT SHORT TERM GOAL #3   Title Pt will improve gait velocity from 1.79 ft/sec to > / = 2.09 ft/sec to indicate decreased risk of recurrent falls.   (Target date: 07/01/15)   PT SHORT TERM GOAL #4   Title Pt will negotiate 2 stairs without rails with mod I using LRAD to indicate increased independence using primary home entrance.  (Target date: 07/01/15)   PT SHORT TERM GOAL #5   Title Pt will ambulate > 300' over level, indoor surfaces without AFO with no evidence of L ankle instability (excessive forefoot supination) to indicate increased safety with ambulation.   (Target date: 07/01/15)   PT SHORT TERM GOAL #6   Title Pt will ambulate 50' over grass surfaces with supervision using LRAD to increase pt safety attending grandson's soccer games.   (Target date: 07/01/15)   PT SHORT TERM GOAL #7   Title Pt will perform floor transfer (supine on floor > standing) with min A without use of external aid to indicate increased safety with fall recovery.   (Target date: 07/01/15)            PT Long Term Goals - 08/08/15 2010    PT LONG TERM GOAL #1   Title Pt  will improve gait velocity from 1.79 ft/sec to > / = 2.39 ft/sec to indicate increased efficiency of ambulation.   (Target date: 09/19/15)   PT LONG TERM GOAL #2   Title Pt will score > / = 19/30 on FGA to indicate decreased fall risk.   (Target date: 09/19/15)   PT LONG TERM GOAL #3   Title Pt will ambulate > 500' over unlevel, paved surfaces without AFO with mod I using LRAD with no L ankle instability to indicate safety with community mobility.  (Target date: 09/19/15)   PT LONG  TERM GOAL #4   Title Pt will ambulate 31' over unlevel, grass surfaces with mod I using LRAD to indicate increased independence attending grandson's soccer games.    (Target date: 09/19/15)   PT LONG TERM GOAL #5   Title Pt will perform floor transfer (supine on floor > standing) with supervision without use of external aid to indicate increased safety with fall recovery.   (Target date: 09/19/15)   Additional Long Term Goals   Additional Long Term Goals Yes   PT LONG TERM GOAL #6   Title Pt will negotiate 4 stairs without rails with mod I using LRAD to indicate increased access to community.   (Target date: 09/19/15)            Plan - 08/15/15 1654    Clinical Impression Statement Skilled session addressing functional gait and extensor tone. HEP issued with focus on addressing tone and L kne/hip flexion. Patient is making slow but steady progress toward goals.   Pt will benefit from skilled therapeutic intervention in order to improve on the following deficits Abnormal gait;Decreased balance;Decreased coordination;Increased edema;Impaired flexibility;Impaired sensation;Impaired tone;Decreased strength   Rehab Potential Good   Clinical Impairments Affecting Rehab Potential Time since CVA (> 2 years)   PT Frequency 2x / week   PT Duration 6 weeks   PT Treatment/Interventions ADLs/Self Care Home Management;DME Instruction;Gait training;Stair training;Functional mobility training;Therapeutic activities;Therapeutic  exercise;Balance training;Orthotic Fit/Training;Patient/family education;Neuromuscular re-education;Vestibular   PT Next Visit Plan Review HEP with emphasis on selective control of L knee flexion with L hip flexion. Address gait and balance.   PT Home Exercise Plan Issued HEP with seated hamstring stretch, heel slides, and STS transfer with right foot forward.   Consulted and Agree with Plan of Care Patient;Family member/caregiver   Family Member Consulted wife, Donald [name]        Problem List Patient Active Problem List   Diagnosis Date Noted  . Snoring 07/03/2015  . Persistent atrial fibrillation ([place]) 04/16/2015  . Hypokalemia 09/20/2014  . HTN (hypertension) 09/19/2014  . HLD (hyperlipidemia) 09/19/2014  . Diabetes mellitus without complication ([place]) [id number]  . Jerking 09/19/2014  . Alterations of sensations, late effect of cerebrovascular disease 11/07/2013  . Disturbances of vision, late effect of cerebrovascular disease 11/07/2013  . Sinus bradycardia 08/08/2013  . 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[place]) 07/26/2013  . Left-sided neglect 07/26/2013  . Small vessel disease ([place]) 06/07/2013  . Obesity, unspecified 06/07/2013  . CVA (cerebral infarction) 06/07/2013  . large right middle cerebral artery infarct, embolic [id number]  . Hypertension 06/03/2013  . Diabetes [facility]) 06/03/2013    Bayard Beaver, [place] 08/15/2015, 5:01 PM  [facility] [address] [place], [place], [address] Phone: [phone]   Fax:  [phone]  Name: Sokha Whinery MRN: [id number] Date of Birth: 01-Aug-1945  This note has been reviewed and edited by supervising CI.  Willow Ora, PTA, [facility] [address] [place], [place] [address] [phone] 08/16/2015, 3:42 PM

## 2015-08-17 ENCOUNTER — Encounter: Payer: Self-pay | Admitting: Occupational Therapy

## 2015-08-17 ENCOUNTER — Ambulatory Visit: Payer: Medicare Other | Admitting: Occupational Therapy

## 2015-08-17 DIAGNOSIS — R208 Other disturbances of skin sensation: Secondary | ICD-10-CM | POA: Diagnosis not present

## 2015-08-17 DIAGNOSIS — I69998 Other sequelae following unspecified cerebrovascular disease: Secondary | ICD-10-CM

## 2015-08-17 DIAGNOSIS — R279 Unspecified lack of coordination: Secondary | ICD-10-CM | POA: Diagnosis not present

## 2015-08-17 DIAGNOSIS — IMO0002 Reserved for concepts with insufficient information to code with codable children: Secondary | ICD-10-CM

## 2015-08-17 DIAGNOSIS — R2681 Unsteadiness on feet: Secondary | ICD-10-CM | POA: Diagnosis not present

## 2015-08-17 DIAGNOSIS — I69354 Hemiplegia and hemiparesis following cerebral infarction affecting left non-dominant side: Secondary | ICD-10-CM | POA: Diagnosis not present

## 2015-08-17 DIAGNOSIS — I6980 Unspecified sequelae of other cerebrovascular disease: Secondary | ICD-10-CM | POA: Diagnosis not present

## 2015-08-17 DIAGNOSIS — I69898 Other sequelae of other cerebrovascular disease: Secondary | ICD-10-CM | POA: Diagnosis not present

## 2015-08-17 DIAGNOSIS — I69359 Hemiplegia and hemiparesis following cerebral infarction affecting unspecified side: Secondary | ICD-10-CM

## 2015-08-17 DIAGNOSIS — R209 Unspecified disturbances of skin sensation: Secondary | ICD-10-CM

## 2015-08-18 NOTE — Therapy (Signed)
[facility] [address] [place], [place], [address] Phone: [phone]   Fax:  [phone]  Occupational Therapy Treatment  Patient Details  Name: Donald [name] MRN: [id number] Date of Birth: [birth year] Referring Provider: Dr. Margette Fast  Encounter Date: 08/17/2015      OT End of Session - 08/18/15 1643    Visit Number 2   Number of Visits 17   Date for OT Re-Evaluation 10/03/15   Authorization Type Medicare   Authorization Time Period Progress note / gcode 10th visit   Authorization - Visit Number 2   Authorization - Number of Visits 10   OT Start Time 1525   OT Stop Time 1613   OT Time Calculation (min) 48 min   Activity Tolerance Patient tolerated treatment well   Behavior During Therapy [facility] for tasks assessed/performed      Past Medical History  Diagnosis Date  . Hypertension   . Diabetes mellitus without complication ([place])   . Hyperlipemia   . large right middle cerebral artery infarct, embolic [id number]    a. s/p IV tPA, b. source unknown, c. loop recorder placed  . Snoring 07/03/2015    Past Surgical History  Procedure Laterality Date  . Tee without cardioversion N/A 06/07/2013    Procedure: TRANSESOPHAGEAL ECHOCARDIOGRAM (TEE);  Surgeon: [name], MD;  Location: [facility] ENDOSCOPY;  Service: Cardiovascular;  Laterality: N/A;  . Loop recorder implant  06/07/2013    MDT LinQ implanted by Dr [name] for cryptogenic stroke  . Loop recorder implant N/A 06/07/2013    Procedure: LOOP RECORDER IMPLANT;  Surgeon: [name], MD;  Location: [facility] CATH LAB;  Service: Cardiovascular;  Laterality: N/A;    There were no vitals filed for this visit.  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 No/denies                      OT Treatments/Exercises (OP) - 08/18/15 0001    Neurological Re-education Exercises   Other Exercises 1 Neuromuscular reeducation to address glenohumeral joint tightness, and then active motion via contract/relax in increasingly greater ranges.  Patient with increased muscle tension in chest wall muscles, and significant weakness in scapular stabilizers.  Worked to reduce pectoralis activation to allow greater humeral range of motion in shoulder flexion, abduction.     Other Exercises 2 In supine worked initlaly on shoulder flexion with emphasis on proper biomechanics.  Followed with isolated elbow flexion / extension with shoulder flexed to 90 degrees.  Sidelying to address body on arm activation of scapula stabilizers.  Followed with high reach in sitting - emphasizing lengthening left trunk.                OT Education - 08/18/15 1643    Education provided Yes   Education Details Chest wall tightness, and active relaxation   Person(s) Educated Patient;Spouse   Methods Explanation;Demonstration;Tactile cues;Verbal cues   Comprehension Verbalized understanding;Returned demonstration;Need further instruction  OT Short Term Goals - 08/18/15 1645    OT SHORT TERM GOAL #1   Title Patient will complete home exercise program independently due 09/13/15   OT [place] #2   Title Patient will demonstrate improved coordination in left hand as evidenced by decrease time on 9 hole peg test by 10 seconds   Status On-going   OT SHORT TERM GOAL #3   Title Patient will demonstrate ability to support neck of guitar with left hand while seated, and demonstrate adequate finger control to form 1-2 cord  positions with increased time   Status On-going   OT SHORT TERM GOAL #4   Title Patient will adequately use bilateral hands to slice a soft fruit or vegetable (tomatao) without incident with minimal environmental set up and cueing   Status On-going   OT SHORT TERM GOAL #5   Title Patient will demonstrate adequate strength and control of left UE to lift an object weighing up to 3 lb weight into overhead shelf    Status On-going           OT Long Term Goals - 08/18/15 1646    OT LONG TERM GOAL #1   Title Patient will complete update home exercise / home activity program independently (due 10/03/15)   Status On-going   OT LONG TERM GOAL #2   Title Patient will demonstrate improved fine motor coordination in left UE as evidenced by 20 second reduction in time on 9 hole peg test   Status On-going   OT LONG TERM GOAL #3   Title Patient will adjust left hand position on neck of guitar for 3-4 chord changes in time with simple song.   Status On-going   OT LONG TERM GOAL #4   Title Patient will unload dishwasher lifting 2-3 plates at once (or comparable weight)  into overhead shelf    Status On-going   OT LONG TERM GOAL #5   Title Patient will carry beverage in left hand  while walking in community at least 150 feet,using cane in right hand in minimally crowded / congested environment    Status On-going               Plan - 08/18/15 1644    Clinical Impression Statement Reviewed all goals with patient, and patient is in agreement.     Pt will benefit from skilled therapeutic intervention in order to improve on the following deficits (Retired) Decreased activity tolerance;Decreased balance;Decreased cognition;Decreased coordination;Decreased range of motion;Decreased mobility;Decreased strength;Difficulty walking;Impaired perceived functional ability;Improper body mechanics;Impaired UE functional use;Impaired tone;Impaired sensation   Rehab Potential Good   OT Frequency 2x / week    OT Duration 8 weeks   OT Treatment/Interventions Self-care/ADL training;Ultrasound;Therapeutic exercise;Neuromuscular education;Manual Therapy;Functional Mobility Training;DME and/or AE instruction;Splinting;Therapeutic exercises;Therapeutic activities;Balance training;Patient/family education   Plan NMR LUE, Begin HEP   Consulted and Agree with Plan of Care Patient;Family member/caregiver   Family Member Consulted wife [name]        Problem List Patient Active Problem List   Diagnosis Date Noted  . Snoring 07/03/2015  . Persistent atrial fibrillation ([place]) 04/16/2015  . Hypokalemia 09/20/2014  . HTN (hypertension) 09/19/2014  . HLD (hyperlipidemia) 09/19/2014  . Diabetes mellitus without complication ([place]) [id number]  . Jerking 09/19/2014  . Alterations of sensations, late effect of cerebrovascular disease 11/07/2013  . Disturbances of vision, late effect of cerebrovascular disease 11/07/2013  . Sinus bradycardia 08/08/2013  . 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[place]) 07/26/2013  . Left-sided neglect 07/26/2013  .  Small vessel disease ([place]) 06/07/2013  . Obesity, unspecified 06/07/2013  . CVA (cerebral infarction) 06/07/2013  . large right middle cerebral artery infarct, embolic [id number]  . Hypertension 06/03/2013  . Diabetes ([place]) 06/03/2013    Mariah Milling, OTR/L 08/18/2015, 4:47 PM  [facility] [address] [place], [place], [address] Phone: [phone]   Fax:  [phone]  Name: Donald [name] MRN: [id number] Date of Birth: 1945/06/05

## 2015-08-21 ENCOUNTER — Ambulatory Visit: Payer: Medicare Other | Admitting: Occupational Therapy

## 2015-08-21 ENCOUNTER — Ambulatory Visit: Payer: Medicare Other | Admitting: Physical Therapy

## 2015-08-21 ENCOUNTER — Encounter: Payer: Self-pay | Admitting: Occupational Therapy

## 2015-08-21 ENCOUNTER — Encounter: Payer: Self-pay | Admitting: Physical Therapy

## 2015-08-21 DIAGNOSIS — I69998 Other sequelae following unspecified cerebrovascular disease: Secondary | ICD-10-CM

## 2015-08-21 DIAGNOSIS — I69359 Hemiplegia and hemiparesis following cerebral infarction affecting unspecified side: Secondary | ICD-10-CM

## 2015-08-21 DIAGNOSIS — R2681 Unsteadiness on feet: Secondary | ICD-10-CM | POA: Diagnosis not present

## 2015-08-21 DIAGNOSIS — R279 Unspecified lack of coordination: Secondary | ICD-10-CM | POA: Diagnosis not present

## 2015-08-21 DIAGNOSIS — IMO0002 Reserved for concepts with insufficient information to code with codable children: Secondary | ICD-10-CM

## 2015-08-21 DIAGNOSIS — I69898 Other sequelae of other cerebrovascular disease: Secondary | ICD-10-CM | POA: Diagnosis not present

## 2015-08-21 DIAGNOSIS — I6980 Unspecified sequelae of other cerebrovascular disease: Secondary | ICD-10-CM | POA: Diagnosis not present

## 2015-08-21 DIAGNOSIS — I69354 Hemiplegia and hemiparesis following cerebral infarction affecting left non-dominant side: Secondary | ICD-10-CM | POA: Diagnosis not present

## 2015-08-21 DIAGNOSIS — R209 Unspecified disturbances of skin sensation: Secondary | ICD-10-CM

## 2015-08-21 DIAGNOSIS — R208 Other disturbances of skin sensation: Secondary | ICD-10-CM | POA: Diagnosis not present

## 2015-08-21 NOTE — Patient Instructions (Signed)
Home Program for Left Arm: Do 2 times per day every day.  Let your therapist know if you experience any pain.  Lay on your back. Hold 4 pound weight between your hands, palms facing each other. Start at chest and raise weight straight up toward ceiling until elbows are straight. Keeping elbows straight, raise weight overhead as far as you can go and then reach toward your knees.  Do 10, rest then do 10 more.

## 2015-08-21 NOTE — Therapy (Signed)
[facility] [address] [place], [place], [address] Phone: [phone]   Fax:  [phone]  Physical Therapy Treatment  Patient Details  Name: Donald [name] MRN: [id number] Date of Birth: 11-28-1945 Referring Provider: Ernestine Mcmurray, NP  Encounter Date: 08/21/2015      PT End of Session - 08/21/15 1622    Visit Number 3   Number of Visits 13   Date for PT Re-Evaluation 09/22/15   Authorization Type Medicare primary; [employer] secondary - G Codes and PN required every 10 visits   PT Start Time 1531   PT Stop Time 1613   PT Time Calculation (min) 42 min   Equipment Utilized During Treatment Gait belt   Activity Tolerance Patient tolerated treatment well   Behavior During Therapy [facility] for tasks assessed/performed      Past Medical History  Diagnosis Date  . Hypertension   . Diabetes mellitus without complication ([place])   . Hyperlipemia   . large right middle cerebral artery infarct, embolic [id number]    a. s/p IV tPA, b. source unknown, c. loop recorder placed  . Snoring 07/03/2015    Past Surgical History  Procedure Laterality Date  . Tee without cardioversion N/A 06/07/2013    Procedure: TRANSESOPHAGEAL ECHOCARDIOGRAM (TEE);  Surgeon: [name], MD;  Location: [facility] ENDOSCOPY;  Service: Cardiovascular;  Laterality: N/A;  . Loop recorder implant  06/07/2013    MDT LinQ implanted by Dr [name] for cryptogenic stroke  . Loop recorder implant N/A 06/07/2013    Procedure: LOOP RECORDER IMPLANT;  Surgeon: [name], MD;  Location: [facility] CATH LAB;  Service: Cardiovascular;  Laterality: N/A;    There were no vitals filed for this visit.  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 Patient reports feeling better today. Patient  reports some success with HEP and verbalizes use of L leg more in transfers and using UEs less.   Patient is accompained by: Family member  wife, Donald [name]   Pertinent History Pt likes to be called "Donald [name]".   PMH significant for: CVA (large R MCA infarct, embolic),  seizures, HTN, HLD, DM, persistent A-Fib   Patient Stated Goals Work on walking downstairs, work on walking technique to make it better, and to work on my reaction time when I lose my balance to my left side.   Currently in Pain? No/denies          [facility] Adult PT Treatment/Exercise - 08/21/15 1616    Neuro Re-ed    Neuro Re-ed Details  // Bars on airex balance pad with no UE support min/guard to minA with feet apart vertical, horizontal, and diagonal head movement; eyes closed with vertical, horizontal, and diagonal head movemenet all for 1 set of 10 reps each. Side-stepping on foam beam down/back x 2 to decrease fall risk.   Exercises   Exercises Knee/Hip   Knee/Hip Exercises: Stretches   Active Hamstring Stretch Left;2 reps;30 seconds   Knee/Hip Exercises: Seated   Sit to Sand 1 set;10 reps  With R foot out in front with no UE support.   Knee/Hip Exercises: Supine   Heel Slides Strengthening;Left;1 set;10 reps          PT Short Term Goals - 08/16/15 1543    PT SHORT TERM GOAL #1   Title  Pt will perform initial HEP with mod I using paper handout to indicate safe daily compliance with home program.   (Target date: 07/01/15)   PT SHORT TERM GOAL #2   Title Complete FGA and improve score by 4 points to indicate improved dynamic gait stability.   (Target date: 07/01/15)   Baseline 08/15/15: baseline 11/30 today.   PT SHORT TERM GOAL #3   Title Pt will improve gait velocity from 1.79 ft/sec to > / = 2.09 ft/sec to indicate decreased risk of recurrent falls.   (Target date: 07/01/15)   PT SHORT TERM GOAL #4   Title Pt will negotiate 2 stairs without rails with mod I using LRAD to indicate increased independence using primary home  entrance.  (Target date: 07/01/15)   PT SHORT TERM GOAL #5   Title Pt will ambulate > 300' over level, indoor surfaces without AFO with no evidence of L ankle instability (excessive forefoot supination) to indicate increased safety with ambulation.   (Target date: 07/01/15)   PT SHORT TERM GOAL #6   Title Pt will ambulate 50' over grass surfaces with supervision using LRAD to increase pt safety attending grandson's soccer games.   (Target date: 07/01/15)   PT SHORT TERM GOAL #7   Title Pt will perform floor transfer (supine on floor > standing) with min A without use of external aid to indicate increased safety with fall recovery.   (Target date: 07/01/15)          PT Long Term Goals - 08/08/15 2010    PT LONG TERM GOAL #1   Title Pt will improve gait velocity from 1.79 ft/sec to > / = 2.39 ft/sec to indicate increased efficiency of ambulation.   (Target date: 09/19/15)   PT LONG TERM GOAL #2   Title Pt will score > / = 19/30 on FGA to indicate decreased fall risk.   (Target date: 09/19/15)   PT LONG TERM GOAL #3   Title Pt will ambulate > 500' over unlevel, paved surfaces without AFO with mod I using LRAD with no L ankle instability to indicate safety with community mobility.  (Target date: 09/19/15)   PT LONG TERM GOAL #4   Title Pt will ambulate 79' over unlevel, grass surfaces with mod I using LRAD to indicate increased independence attending grandson's soccer games.    (Target date: 09/19/15)   PT LONG TERM GOAL #5   Title Pt will perform floor transfer (supine on floor > standing) with supervision without use of external aid to indicate increased safety with fall recovery.   (Target date: 09/19/15)   Additional Long Term Goals   Additional Long Term Goals Yes   PT LONG TERM GOAL #6   Title Pt will negotiate 4 stairs without rails with mod I using LRAD to indicate increased access to community.   (Target date: 09/19/15)          Plan - 08/21/15 1623    Clinical Impression Statement  Skilled session addressing HEP review and addressing deficits in static and dynamic balance.Pt with notable left ankle stability with balance activities, rolls out on both stable and compliant surfaces. Pt reports he has always had weak ankles with multiple sprains before his CVA.Patient displays enthusiasm for improving balance. Patient displays understanding of HEP exercises and displays correct technique for exercises. Patient is making steady progress toward goals.   Pt will benefit from skilled therapeutic intervention in order to improve on the following deficits Abnormal gait;Decreased balance;Decreased coordination;Increased edema;Impaired  flexibility;Impaired sensation;Impaired tone;Decreased strength   Rehab Potential Good   Clinical Impairments Affecting Rehab Potential Time since CVA (> 2 years)   PT Frequency 2x / week   PT Duration 6 weeks   PT Treatment/Interventions ADLs/Self Care Home Management;DME Instruction;Gait training;Stair training;Functional mobility training;Therapeutic activities;Therapeutic exercise;Balance training;Orthotic Fit/Training;Patient/family education;Neuromuscular re-education;Vestibular   PT Next Visit Plan Continue with static and dynamic balance; begin ankle strengthening and consider addition of ankle strengthening exercises to HEP.   PT Home Exercise Plan Reviewed HEP with seated hamstring stretch, heel slides, and sit-to-stand transfer with right foot forward. Consider addition of ankle strengthening exercises for L ankle.   Consulted and Agree with Plan of Care Patient;Family member/caregiver   Family Member Consulted wife, Donald [name]      Problem List Patient Active Problem List   Diagnosis Date Noted  . Snoring 07/03/2015  . Persistent atrial fibrillation ([place]) 04/16/2015  . Hypokalemia 09/20/2014  . HTN (hypertension) 09/19/2014  . HLD (hyperlipidemia) 09/19/2014  . Diabetes mellitus without complication ([place]) [id number]  . Jerking 09/19/2014  .  Alterations of sensations, late effect of cerebrovascular disease 11/07/2013  . Disturbances of vision, late effect of cerebrovascular disease 11/07/2013  . Sinus bradycardia 08/08/2013  . 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[place]) 07/26/2013  . Left-sided neglect 07/26/2013  . Small vessel disease ([place]) 06/07/2013  . Obesity, unspecified 06/07/2013  . CVA (cerebral infarction) 06/07/2013  . large right middle cerebral artery infarct, embolic [id number]  . Hypertension 06/03/2013  . Diabetes [facility]) 06/03/2013    Bayard Beaver, [place] 08/21/2015, 4:29 PM  [facility] [address] [place], [place], [address] Phone: [phone]   Fax:  [phone]  Name: Bach Deems MRN: [id number] Date of Birth: 16-Oct-1945  This note has been reviewed and edited by supervising CI.  Willow Ora, PTA, [facility] [address] [place] [address], [place], [place] [address] [phone] 08/22/2015, 9:48 AM

## 2015-08-21 NOTE — Therapy (Signed)
[facility] [address] [place], [place], [address] Phone: [phone]   Fax:  [phone]  Occupational Therapy Treatment  Patient Details  Name: Donald [name] MRN: [id number] Date of Birth: 07-05-1945 Referring Provider: Dr. Margette Fast  Encounter Date: 08/21/2015      OT End of Session - 08/21/15 1649    Visit Number 3   Number of Visits 17   Date for OT Re-Evaluation 10/03/15   Authorization Type Medicare   Authorization Time Period Progress note / gcode 10th visit   Authorization - Visit Number 3   Authorization - Number of Visits 10   OT Start Time [id number]   OT Stop Time 1530   OT Time Calculation (min) 44 min   Activity Tolerance Patient tolerated treatment well      Past Medical History  Diagnosis Date  . Hypertension   . Diabetes mellitus without complication ([place])   . Hyperlipemia   . large right middle cerebral artery infarct, embolic [id number]    a. s/p IV tPA, b. source unknown, c. loop recorder placed  . Snoring 07/03/2015    Past Surgical History  Procedure Laterality Date  . Tee without cardioversion N/A 06/07/2013    Procedure: TRANSESOPHAGEAL ECHOCARDIOGRAM (TEE);  Surgeon: [name], MD;  Location: [facility] ENDOSCOPY;  Service: Cardiovascular;  Laterality: N/A;  . Loop recorder implant  06/07/2013    MDT LinQ implanted by Dr [name] for cryptogenic stroke  . Loop recorder implant N/A 06/07/2013    Procedure: LOOP RECORDER IMPLANT;  Surgeon: [name], MD;  Location: [facility] CATH LAB;  Service: Cardiovascular;  Laterality: N/A;    There were no vitals filed for this visit.  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 No/denies                      OT Treatments/Exercises (OP) - 08/21/15 0001    Neurological Re-education Exercises   Other Exercises 1 Neuro re ed to address LUE overhead reach in sitting and standing. Pt with tightness in shoulder girdle with overhead unilateral  and bilateral reach - pt able to achieve full 180* overhead bilateral reach in closed chain activity with min facilitation.  Worked in sitting and standing with emphasis on alignment, weight shift to L, incorporation of active trunk, and activation into the length achieved in shoulder girdle. Also addresed stable mobility of shoulder girdle in overhead reach in supine using resistance and pt given start of HEP - see pt instruction for details.                 OT Education - 08/21/15 1647    Education provided Yes   Education Details HEP for LUE   Person(s) Educated Patient;Spouse   Methods Explanation;Demonstration;Tactile cues;Verbal cues;Handout   Comprehension Verbalized understanding;Returned demonstration          OT Short Term Goals - 08/21/15 1648    OT SHORT TERM GOAL #  1   Title Patient will complete home exercise program independently due 09/13/15   OT [place] #2   Title Patient will demonstrate improved coordination in left hand as evidenced by decrease time on 9 hole peg test by 10 seconds   Status On-going   OT SHORT TERM GOAL #3   Title Patient will demonstrate ability to support neck of guitar with left hand while seated, and demonstrate adequate finger control to form 1-2 cord positions with increased time   Status On-going   OT SHORT TERM GOAL #4   Title Patient will adequately use bilateral hands to slice a soft fruit or vegetable (tomatao) without incident with minimal environmental set  up and cueing   Status On-going   OT SHORT TERM GOAL #5   Title Patient will demonstrate adequate strength and control of left UE to lift an object weighing up to 3 lb weight into overhead shelf    Status On-going           OT Long Term Goals - 08/21/15 1648    OT LONG TERM GOAL #1   Title Patient will complete update home exercise / home activity program independently (due 10/03/15)   Status On-going   OT LONG TERM GOAL #2   Title Patient will demonstrate improved fine motor coordination in left UE as evidenced by 20 second reduction in time on 9 hole peg test   Status On-going   OT LONG TERM GOAL #3   Title Patient will adjust left hand position on neck of guitar for 3-4 chord changes in time with simple song.   Status On-going   OT LONG TERM GOAL #4   Title Patient will unload dishwasher lifting 2-3 plates at once (or comparable weight)  into overhead shelf    Status On-going   OT LONG TERM GOAL #5   Title Patient will carry beverage in left hand  while walking in community at least 150 feet,using cane in right hand in minimally crowded / congested environment    Status On-going               Plan - 08/21/15 1648    Clinical Impression Statement Pt making progress toward [place].  Pt encouraged to be as compliant as possible with HEP. Pt i n agreement.   Pt will benefit from skilled therapeutic intervention in order to improve on the following deficits (Retired) Decreased activity tolerance;Decreased balance;Decreased cognition;Decreased coordination;Decreased range of motion;Decreased mobility;Decreased strength;Difficulty walking;Impaired perceived functional ability;Improper body mechanics;Impaired UE functional use;Impaired tone;Impaired sensation   Rehab Potential Good   OT Frequency 2x / week   OT Duration 8 weeks   OT Treatment/Interventions Self-care/ADL training;Ultrasound;Therapeutic exercise;Neuromuscular education;Manual Therapy;Functional Mobility Training;DME  and/or AE instruction;Splinting;Therapeutic exercises;Therapeutic activities;Balance training;Patient/family education   Plan NMR LUE, check HEP, trunk control/flexibility   Consulted and Agree with Plan of Care Patient;Family member/caregiver   Family Member Consulted wife [name]        Problem List Patient Active Problem List   Diagnosis Date Noted  . Snoring 07/03/2015  . Persistent atrial fibrillation ([place]) 04/16/2015  . Hypokalemia 09/20/2014  . HTN (hypertension) 09/19/2014  . HLD (hyperlipidemia) 09/19/2014  . Diabetes mellitus without complication ([place]) [id number]  . Jerking 09/19/2014  . Alterations of sensations, late effect of cerebrovascular disease 11/07/2013  . Disturbances of vision, late effect of cerebrovascular disease 11/07/2013  . Sinus bradycardia 08/08/2013  . 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[place]) 07/26/2013  . Left-sided neglect 07/26/2013  . Small vessel  disease ([place]) 06/07/2013  . Obesity, unspecified 06/07/2013  . CVA (cerebral infarction) 06/07/2013  . large right middle cerebral artery infarct, embolic [id number]  . Hypertension 06/03/2013  . Diabetes ([place]) 06/03/2013    Quay Burow, OTR/L 08/21/2015, 4:50 PM  [facility] [address] [place], [place], [address] Phone: [phone]   Fax:  [phone]  Name: Donald [name] MRN: [id number] Date of Birth: [birth year]

## 2015-08-22 ENCOUNTER — Ambulatory Visit: Payer: Medicare Other | Admitting: Occupational Therapy

## 2015-08-22 ENCOUNTER — Ambulatory Visit: Payer: Medicare Other | Admitting: Physical Therapy

## 2015-08-22 DIAGNOSIS — I69359 Hemiplegia and hemiparesis following cerebral infarction affecting unspecified side: Secondary | ICD-10-CM

## 2015-08-22 DIAGNOSIS — R208 Other disturbances of skin sensation: Secondary | ICD-10-CM | POA: Diagnosis not present

## 2015-08-22 DIAGNOSIS — I69354 Hemiplegia and hemiparesis following cerebral infarction affecting left non-dominant side: Secondary | ICD-10-CM | POA: Diagnosis not present

## 2015-08-22 DIAGNOSIS — IMO0002 Reserved for concepts with insufficient information to code with codable children: Secondary | ICD-10-CM

## 2015-08-22 DIAGNOSIS — I69998 Other sequelae following unspecified cerebrovascular disease: Secondary | ICD-10-CM

## 2015-08-22 DIAGNOSIS — I69898 Other sequelae of other cerebrovascular disease: Secondary | ICD-10-CM | POA: Diagnosis not present

## 2015-08-22 DIAGNOSIS — R2681 Unsteadiness on feet: Secondary | ICD-10-CM | POA: Diagnosis not present

## 2015-08-22 DIAGNOSIS — I6980 Unspecified sequelae of other cerebrovascular disease: Secondary | ICD-10-CM | POA: Diagnosis not present

## 2015-08-22 DIAGNOSIS — R269 Unspecified abnormalities of gait and mobility: Secondary | ICD-10-CM

## 2015-08-22 DIAGNOSIS — R209 Unspecified disturbances of skin sensation: Principal | ICD-10-CM

## 2015-08-22 DIAGNOSIS — R279 Unspecified lack of coordination: Secondary | ICD-10-CM | POA: Diagnosis not present

## 2015-08-22 NOTE — Therapy (Signed)
[facility] [address] [place], [place], [address] Phone: [phone]   Fax:  [phone]  Physical Therapy Treatment  Patient Details  Name: [name] MRN: [id number] Date of Birth: [birth year] Referring Provider: Ernestine Mcmurray, NP  Encounter Date: 08/22/2015      PT End of Session - 08/22/15 1728    Visit Number 4   Number of Visits 13   Date for PT Re-Evaluation 09/22/15   Authorization Type Medicare primary; [employer] secondary - G Codes and PN required every 10 visits   PT Start Time 1454   PT Stop Time 1533   PT Time Calculation (min) 39 min   Equipment Utilized During Treatment Gait belt   Activity Tolerance Patient tolerated treatment well   Behavior During Therapy [name] for tasks assessed/performed      Past Medical History  Diagnosis Date  . Hypertension   . Diabetes mellitus without complication ([place])   . Hyperlipemia   . large right middle cerebral artery infarct, embolic [id number]    a. s/p IV tPA, b. source unknown, c. loop recorder placed  . Snoring 07/03/2015    Past Surgical History  Procedure Laterality Date  . Tee without cardioversion N/A 06/07/2013    Procedure: TRANSESOPHAGEAL ECHOCARDIOGRAM (TEE);  Surgeon: [facility] Spark, Donald;  Location: [name];  Service: Cardiovascular;  Laterality: N/A;  . Loop recorder implant  06/07/2013    MDT [name] implanted by Dr Donald [name] for cryptogenic stroke  . Loop recorder implant N/A 06/07/2013    Procedure: LOOP RECORDER IMPLANT;  Surgeon: [facility] Mark, Donald;  Location: [name];  Service: Cardiovascular;  Laterality: N/A;    There were no vitals filed for this visit.  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 Doesn't really notice the left leg swinging around and, if anything, pt perceives that it's only present of initial few steps when walking. Pt does notice left ankle being unstable and feels he could potentially "roll it" in certain situations.   Patient is accompained by: Family member  wife, [name]   Pertinent History Pt likes to be called "[name]".   PMH significant for: CVA (large R MCA infarct, embolic),  seizures, HTN, HLD, DM, persistent A-Fib   Patient Stated Goals Work on walking downstairs, work on walking technique to make it better, and to work on my reaction time when I lose my balance to my left side.   Currently in Pain? No/denies                         [facility] Adult PT Treatment/Exercise - 08/22/15 0001    Ambulation/Gait   Ambulation/Gait Yes   Ambulation/Gait Assistance 5: Supervision   Ambulation/Gait Assistance Details Blocked practice of gait training at // bars, initially gait using mirror to increase pt awareness of foot/ankle posture, RLE circumduction due to lack of L knee flexion during LLE advancement. Transitioned to blocked practice of gait with PT providing manual L subtalar eversion throughout swing then providing tactile cueing at L peroneals during L intial contact to midstance for M/L stability during stance.With increased practice, pt did demo improved L ankle eversion but some compensation via L  tibial external rotation. Progressed to gait 2 x210' over level, indoor surfaces with SPC with multimodal cueing to prevent trunk extension to initiate LLE advancement.    Ambulation Distance (Feet) 500 Feet   Assistive device Straight cane;Parallel bars   Gait Pattern Step-through pattern;Decreased arm swing - left;Decreased dorsiflexion - left;Left circumduction;Decreased hip/knee flexion - left  trunk extension to initiate L swing; L ankle M/L instability   Ambulation Surface  Level;Indoor   Neuro Re-ed    Neuro Re-ed Details  - Supine LLE D2 PNF flexion/extension x15 rhythmic initiation with focus on selective control of L ankle eversion during L knee flexion. Noted ability to initiate L subtalar eversion/DF with L knee extension, but difficulty maintaining eversion with L knee flexion. Transitioned to x10 reps slow reversal hold during D2 flexion to emphasize initiation of LLE advancement via L hip/knee flexion (as opposed to LLE circumduction).                 PT Education - 08/22/15 1703    Education provided Yes   Education Details How L foot/ankle posture during gait impacts knee position and tone in L arm.   Person(s) Educated Patient;Spouse   Methods Explanation;Demonstration   Comprehension Verbalized understanding          PT Short Term Goals - 08/16/15 1543    PT SHORT TERM GOAL #1   Title Pt will perform initial HEP with mod I using paper handout to indicate safe daily compliance with home program.   (Target date: 07/01/15)   PT SHORT TERM GOAL #2   Title Complete FGA and improve score by 4 points to indicate improved dynamic gait stability.   (Target date: 07/01/15)   Baseline 08/15/15: baseline 11/30 today.   PT SHORT TERM GOAL #3   Title Pt will improve gait velocity from 1.79 ft/sec to > / = 2.09 ft/sec to indicate decreased risk of recurrent falls.   (Target date: 07/01/15)   PT SHORT TERM GOAL #4   Title Pt will negotiate 2 stairs without rails with mod I using LRAD to indicate increased independence using primary home entrance.  (Target date: 07/01/15)   PT SHORT TERM GOAL #5   Title Pt will ambulate > 300' over level, indoor surfaces without AFO with no evidence of L ankle instability (excessive forefoot supination) to indicate increased safety with ambulation.   (Target date: 07/01/15)   PT SHORT TERM GOAL #6   Title Pt will ambulate 50' over grass surfaces with supervision using LRAD to increase pt safety attending grandson's soccer  games.   (Target date: 07/01/15)   PT SHORT TERM GOAL #7   Title Pt will perform floor transfer (supine on floor > standing) with min A without use of external aid to indicate increased safety with fall recovery.   (Target date: 07/01/15)           PT Long Term Goals - 08/08/15 2010    PT LONG TERM GOAL #1   Title Pt will improve gait velocity from 1.79 ft/sec to > / = 2.39 ft/sec to indicate increased efficiency of ambulation.   (Target date: 09/19/15)   PT LONG TERM GOAL #2   Title Pt will score > / = 19/30 on FGA to indicate decreased fall risk.   (Target date: 09/19/15)   PT LONG TERM GOAL #3   Title Pt will ambulate > 500' over unlevel, paved surfaces without AFO with mod I using LRAD with no L ankle instability to indicate  safety with community mobility.  (Target date: 09/19/15)   PT LONG TERM GOAL #4   Title Pt will ambulate 42' over unlevel, grass surfaces with mod I using LRAD to indicate increased independence attending grandson's soccer games.    (Target date: 09/19/15)   PT LONG TERM GOAL #5   Title Pt will perform floor transfer (supine on floor > standing) with supervision without use of external aid to indicate increased safety with fall recovery.   (Target date: 09/19/15)   Additional Long Term Goals   Additional Long Term Goals Yes   PT LONG TERM GOAL #6   Title Pt will negotiate 4 stairs without rails with mod I using LRAD to indicate increased access to community.   (Target date: 09/19/15)               Plan - 08/22/15 1729    Clinical Impression Statement Session focused on gait training with emphasis on M/L stability in L ankle during L stance phase of gait. Pt is able to actively perform L subtalar eversion with dorsiflexion, but demonstrates difficulty selectively controlling eversion during LLE advancement and eccentrically controlling eversion during gait.   Pt will benefit from skilled therapeutic intervention in order to improve on the following deficits  Abnormal gait;Decreased balance;Decreased coordination;Increased edema;Impaired flexibility;Impaired sensation;Impaired tone;Decreased strength   Rehab Potential Good   Clinical Impairments Affecting Rehab Potential Time since CVA (> 2 years)   PT Frequency 2x / week   PT Duration 6 weeks   PT Treatment/Interventions ADLs/Self Care Home Management;DME Instruction;Gait training;Stair training;Functional mobility training;Therapeutic activities;Therapeutic exercise;Balance training;Orthotic Fit/Training;Patient/family education;Neuromuscular re-education;Vestibular   PT Next Visit Plan Speak to patient about UCBL for M/L stability during gait.  Continue with static and dynamic balance; begin ankle strengthening and consider addition of ankle strengthening exercises to HEP.   PT Home Exercise Plan HEP: seated hamstring stretch, heel slides, and sit-to-stand transfer with right foot forward. Consider addition of ankle strengthening exercises for L ankle.   Consulted and Agree with Plan of Care Patient;Family member/caregiver   Family Member Consulted wife, [name]        Problem List Patient Active Problem List   Diagnosis Date Noted  . Snoring 07/03/2015  . Persistent atrial fibrillation ([place]) 04/16/2015  . Hypokalemia 09/20/2014  . HTN (hypertension) 09/19/2014  . HLD (hyperlipidemia) 09/19/2014  . Diabetes mellitus without complication ([place]) [id number]  . Jerking 09/19/2014  . Alterations of sensations, late effect of cerebrovascular disease 11/07/2013  . Disturbances of vision, late effect of cerebrovascular disease 11/07/2013  . Sinus bradycardia 08/08/2013  . 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[place]) 07/26/2013  . Left-sided neglect 07/26/2013  . Small vessel disease ([place]) 06/07/2013  . Obesity, unspecified 06/07/2013  . CVA (cerebral infarction) 06/07/2013  . large right middle cerebral artery infarct, embolic [id number]  . Hypertension 06/03/2013  . Diabetes ([place])  06/03/2013    Billie Ruddy, PT, DPT [name] [address] [place], [place], [address] Phone: [phone]   Fax:  [phone] 08/22/2015, 5:37 PM  Name: [name] MRN: [id number] Date of Birth: [birth year]

## 2015-08-23 ENCOUNTER — Encounter: Payer: Self-pay | Admitting: Occupational Therapy

## 2015-08-23 NOTE — Therapy (Signed)
[facility] [address] [place], [place], [address] Phone: [phone]   Fax:  [phone]  Occupational Therapy Treatment  Patient Details  Name: [name] MRN: [id number] Date of Birth: 08-04-1945 Referring Provider: Dr. Margette Fast  Encounter Date: 08/22/2015      OT End of Session - 08/23/15 0601    Visit Number 4   Number of Visits 17   Date for OT Re-Evaluation 10/03/15   Authorization Type Medicare   Authorization Time Period Progress note / gcode 10th visit   Authorization - Visit Number 4   Authorization - Number of Visits 10   OT Start Time 1535   OT Stop Time 1620   OT Time Calculation (min) 45 min   Activity Tolerance Patient tolerated treatment well   Behavior During Therapy [facility] for tasks assessed/performed      Past Medical History  Diagnosis Date  . Hypertension   . Diabetes mellitus without complication ([place])   . Hyperlipemia   . large right middle cerebral artery infarct, embolic [id number]    a. s/p IV tPA, b. source unknown, c. loop recorder placed  . Snoring 07/03/2015    Past Surgical History  Procedure Laterality Date  . Tee without cardioversion N/A 06/07/2013    Procedure: TRANSESOPHAGEAL ECHOCARDIOGRAM (TEE);  Surgeon: [name], MD;  Location: [facility];  Service: Cardiovascular;  Laterality: N/A;  . Loop recorder implant  06/07/2013    MDT LinQ implanted by Dr [name] for cryptogenic stroke  . Loop recorder implant N/A 06/07/2013    Procedure: LOOP RECORDER IMPLANT;  Surgeon: [name], MD;  Location: [facility];  Service: Cardiovascular;  Laterality: N/A;    There were no vitals filed for this visit.  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    Patient is accompained by: Family member   Pertinent History Afib, seizure 09/2014,    Patient Stated Goals Play guitar    Currently in Pain? No/denies                      OT Treatments/Exercises (OP) - 08/23/15 0001    Neurological Re-education Exercises   Other Exercises 1 Neuromuscular reeducation to address trunk control and left upper extremity activation.  Working toward high reach with trunk elongation and either left hip or foot activation depending if seated or standing.  Patient with strong bias to sit with trunk flexion, and rib cage lists to rightpassively.  Trunk immobility an issue as inactive trunk has been pattern for years.     Other Exercises 2 Working to achieve sustained trunk and shoulder activation for mid toward high level reach.  Patient able to burst motion, but difficulty controlling for sustained activation.  Patient with excessive head / neck activation versus lower trunk activation for upright sitting.  Tends to sit with trunk well behind his base of support.                  OT Education - 08/23/15 0600    Education provided Yes   Education Details reviewed HEP of 3/21.  Postural requirements for UE function   Person(s)  Educated Patient;Spouse   Methods Explanation;Demonstration   Comprehension Verbalized understanding;Need further instruction;Tactile cues required;Verbal cues required          OT Short Term Goals - 08/21/15 1648    OT SHORT TERM GOAL #1   Title Patient will complete home exercise program independently due 09/13/15   OT [place] #2   Title Patient will demonstrate improved coordination in left hand as evidenced by decrease time on 9 hole peg test by 10 seconds   Status On-going   OT SHORT TERM GOAL #3   Title Patient will demonstrate ability to support neck of guitar with left hand while seated, and  demonstrate adequate finger control to form 1-2 cord positions with increased time   Status On-going   OT SHORT TERM GOAL #4   Title Patient will adequately use bilateral hands to slice a soft fruit or vegetable (tomatao) without incident with minimal environmental set up and cueing   Status On-going   OT SHORT TERM GOAL #5   Title Patient will demonstrate adequate strength and control of left UE to lift an object weighing up to 3 lb weight into overhead shelf    Status On-going           OT Long Term Goals - 08/21/15 1648    OT LONG TERM GOAL #1   Title Patient will complete update home exercise / home activity program independently (due 10/03/15)   Status On-going   OT LONG TERM GOAL #2   Title Patient will demonstrate improved fine motor coordination in left UE as evidenced by 20 second reduction in time on 9 hole peg test   Status On-going   OT LONG TERM GOAL #3   Title Patient will adjust left hand position on neck of guitar for 3-4 chord changes in time with simple song.   Status On-going   OT LONG TERM GOAL #4   Title Patient will unload dishwasher lifting 2-3 plates at once (or comparable weight)  into overhead shelf    Status On-going   OT LONG TERM GOAL #5   Title Patient will carry beverage in left hand  while walking in community at least 150 feet,using cane in right hand in minimally crowded / congested environment    Status On-going               Plan - 08/23/15 0602    Clinical Impression Statement Patient showing improvement with active range in left UE, needs increased control of movement for overall function   Pt will benefit from skilled therapeutic intervention in order to improve on the following deficits (Retired) Decreased activity tolerance;Decreased balance;Decreased cognition;Decreased coordination;Decreased range of motion;Decreased mobility;Decreased strength;Difficulty walking;Impaired perceived functional ability;Improper body mechanics;Impaired  UE functional use;Impaired tone;Impaired sensation   Rehab Potential Good   OT Frequency 2x / week   OT Duration 8 weeks   OT Treatment/Interventions Self-care/ADL training;Ultrasound;Therapeutic exercise;Neuromuscular education;Manual Therapy;Functional Mobility Training;DME and/or AE instruction;Splinting;Therapeutic exercises;Therapeutic activities;Balance training;Patient/family education   Plan NMR LUE, trunk control / flexibility, sustained muscle activation in shoulder   OT Home Exercise Plan Initiated 08/21/15, reviewed 08/22/15   Consulted and Agree with Plan of Care Patient;Family member/caregiver   Family Member Consulted wife [name]        Problem List Patient Active Problem List   Diagnosis Date Noted  . Snoring 07/03/2015  . Persistent atrial fibrillation ([place]) 04/16/2015  . Hypokalemia 09/20/2014  . HTN (hypertension) 09/19/2014  . HLD (hyperlipidemia) 09/19/2014  . Diabetes mellitus without  complication ([place]) [id number]  . Jerking 09/19/2014  . Alterations of sensations, late effect of cerebrovascular disease 11/07/2013  . Disturbances of vision, late effect of cerebrovascular disease 11/07/2013  . Sinus bradycardia 08/08/2013  . 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[place]) 07/26/2013  . Left-sided neglect 07/26/2013  . Small vessel disease ([place]) 06/07/2013  . Obesity, unspecified 06/07/2013  . CVA (cerebral infarction) 06/07/2013  . large right middle cerebral artery infarct, embolic [id number]  . Hypertension 06/03/2013  . Diabetes ([place]) 06/03/2013    Mariah Milling, OTR/L 08/23/2015, 6:05 AM  [facility] [address] [place], [place], [address] Phone: [phone]   Fax:  [phone]  Name: [name] MRN: [id number] Date of Birth: 1946/02/29

## 2015-08-24 ENCOUNTER — Encounter: Payer: Medicare Other | Admitting: Occupational Therapy

## 2015-08-24 ENCOUNTER — Ambulatory Visit: Payer: Medicare Other | Admitting: Physical Therapy

## 2015-08-27 ENCOUNTER — Ambulatory Visit: Payer: Medicare Other | Admitting: Physical Therapy

## 2015-08-27 ENCOUNTER — Ambulatory Visit: Payer: Medicare Other | Admitting: Occupational Therapy

## 2015-08-27 ENCOUNTER — Encounter: Payer: Self-pay | Admitting: Occupational Therapy

## 2015-08-27 DIAGNOSIS — I69998 Other sequelae following unspecified cerebrovascular disease: Secondary | ICD-10-CM

## 2015-08-27 DIAGNOSIS — R2681 Unsteadiness on feet: Secondary | ICD-10-CM | POA: Diagnosis not present

## 2015-08-27 DIAGNOSIS — I69359 Hemiplegia and hemiparesis following cerebral infarction affecting unspecified side: Secondary | ICD-10-CM

## 2015-08-27 DIAGNOSIS — IMO0002 Reserved for concepts with insufficient information to code with codable children: Secondary | ICD-10-CM

## 2015-08-27 DIAGNOSIS — R209 Unspecified disturbances of skin sensation: Secondary | ICD-10-CM

## 2015-08-27 DIAGNOSIS — I69898 Other sequelae of other cerebrovascular disease: Secondary | ICD-10-CM | POA: Diagnosis not present

## 2015-08-27 DIAGNOSIS — R208 Other disturbances of skin sensation: Secondary | ICD-10-CM | POA: Diagnosis not present

## 2015-08-27 DIAGNOSIS — R269 Unspecified abnormalities of gait and mobility: Secondary | ICD-10-CM

## 2015-08-27 DIAGNOSIS — I6980 Unspecified sequelae of other cerebrovascular disease: Secondary | ICD-10-CM | POA: Diagnosis not present

## 2015-08-27 DIAGNOSIS — I69354 Hemiplegia and hemiparesis following cerebral infarction affecting left non-dominant side: Secondary | ICD-10-CM | POA: Diagnosis not present

## 2015-08-27 DIAGNOSIS — R279 Unspecified lack of coordination: Secondary | ICD-10-CM | POA: Diagnosis not present

## 2015-08-27 NOTE — Therapy (Signed)
[facility] [address] [place], [place], [address] Phone: [phone]   Fax:  [phone]  Occupational Therapy Treatment  Patient Details  Name: [name] MRN: [id number] Date of Birth: [birth year] Referring Provider: Dr. Margette [name]  Encounter Date: 08/27/2015    Past Medical History  Diagnosis Date  . Hypertension   . Diabetes mellitus without complication ([place])   . Hyperlipemia   . large right middle cerebral artery infarct, embolic [id number]    a. s/p IV tPA, b. source unknown, c. loop recorder placed  . Snoring 07/03/2015    Past Surgical History  Procedure Laterality Date  . Tee without cardioversion N/A 06/07/2013    Procedure: TRANSESOPHAGEAL ECHOCARDIOGRAM (TEE);  Surgeon: [facility] Spark, MD;  Location: [facility] ENDOSCOPY;  Service: Cardiovascular;  Laterality: N/A;  . Loop recorder implant  06/07/2013    MDT LinQ implanted by Dr [name] for cryptogenic stroke  . Loop recorder implant N/A 06/07/2013    Procedure: LOOP RECORDER IMPLANT;  Surgeon: [facility] Mark, MD;  Location: [facility] CATH LAB;  Service: Cardiovascular;  Laterality: N/A;    There were no vitals filed for this visit.  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   Patient is accompained by: Family member  wife   Pertinent History Afib, seizure 09/2014,    Patient Stated Goals Play guitar    Currently in Pain? No/denies                      OT Treatments/Exercises (OP) - 08/27/15 0001    Fine Motor Coordination   Other Fine Motor Exercises Issued coordination program for pt to work on at home -quickly reviewed as pt is familiar. Able to return demonstrate    Neurological Re-education Exercises   Other Exercises 1 Neuro re ed in sitting and standing with emphasis on alignment, activation of trunk with elongation and reach, increased weight bearing on L side with increased activity on L, overhead reach with resistance to address trunk control and scapular stability with overhead reach.                   OT Short Term Goals - 08/27/15 1731    OT SHORT TERM GOAL #1   Title Patient will complete home exercise program independently due 09/13/15   OT SHORT TERM GOAL #2   Title Patient will demonstrate improved coordination in left hand as evidenced by decrease time on 9 hole peg test by 10 seconds   Status On-going   OT SHORT TERM GOAL #3   Title Patient will demonstrate ability to support neck of guitar with left hand while seated, and demonstrate adequate finger control to form 1-2 cord positions with increased time   Status On-going   OT SHORT TERM GOAL #4   Title Patient will adequately use bilateral hands to slice a soft fruit or vegetable (tomatao) without incident with minimal environmental set up and cueing   Status On-going   OT SHORT TERM GOAL #5   Title Patient will demonstrate adequate strength and control of left UE to lift an object weighing up to 3 lb weight into overhead shelf    Status On-going  OT Long Term Goals - 08/27/15 1731    OT LONG TERM GOAL #1   Title Patient will complete update home exercise / home activity program independently (due 10/03/15)   Status On-going   OT LONG TERM GOAL #2   Title Patient will demonstrate improved fine motor coordination in left UE as evidenced by 20 second reduction in time on 9 hole peg test   Status On-going   OT LONG TERM GOAL #3   Title Patient will adjust left hand position on neck of guitar for 3-4 chord changes in time with simple song.   Status On-going   OT LONG TERM GOAL #4   Title Patient will unload dishwasher lifting 2-3 plates at once (or comparable  weight)  into overhead shelf    Status On-going   OT LONG TERM GOAL #5   Title Patient will carry beverage in left hand  while walking in community at least 150 feet,using cane in right hand in minimally crowded / congested environment    Status On-going               Plan - 08/27/15 1731    Clinical Impression Statement Pt progressing slowly toward goals. Pt issued HEP for coordination to work on at home.    Pt will benefit from skilled therapeutic intervention in order to improve on the following deficits (Retired) Decreased activity tolerance;Decreased balance;Decreased cognition;Decreased coordination;Decreased range of motion;Decreased mobility;Decreased strength;Difficulty walking;Impaired perceived functional ability;Improper body mechanics;Impaired UE functional use;Impaired tone;Impaired sensation   Rehab Potential Good   OT Frequency 2x / week   OT Duration 8 weeks   OT Treatment/Interventions Self-care/ADL training;Ultrasound;Therapeutic exercise;Neuromuscular education;Manual Therapy;Functional Mobility Training;DME and/or AE instruction;Splinting;Therapeutic exercises;Therapeutic activities;Balance training;Patient/family education   Plan NMR for LUE, trunk control/flexibility, sustained muscle activation in shoulder, alignment in standing and transitional movements   Consulted and Agree with Plan of Care Patient;Family member/caregiver   Family Member Consulted wife [name]        Problem List Patient Active Problem List   Diagnosis Date Noted  . Snoring 07/03/2015  . Persistent atrial fibrillation ([place]) 04/16/2015  . Hypokalemia 09/20/2014  . HTN (hypertension) 09/19/2014  . HLD (hyperlipidemia) 09/19/2014  . Diabetes mellitus without complication ([place]) [id number]  . Jerking 09/19/2014  . Alterations of sensations, late effect of cerebrovascular disease 11/07/2013  . Disturbances of vision, late effect of cerebrovascular disease 11/07/2013  . Sinus bradycardia  08/08/2013  . 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[place]) 07/26/2013  . Left-sided neglect 07/26/2013  . Small vessel disease ([place]) 06/07/2013  . Obesity, unspecified 06/07/2013  . CVA (cerebral infarction) 06/07/2013  . large right middle cerebral artery infarct, embolic [id number]  . Hypertension 06/03/2013  . Diabetes ([place]) 06/03/2013    Quay Burow, OTR/L 08/27/2015, 5:35 PM  [facility] [address] [place], [place], [address] Phone: [phone]   Fax:  [phone]  Name: [name] MRN: [id number] Date of Birth: Apr 09, 1946

## 2015-08-27 NOTE — Patient Instructions (Signed)
  Coordination Activities  Perform the following activities for 15-20 minutes 1-2 times per day with left hand(s).   Rotate ball in fingertips (clockwise and counter-clockwise).  Toss ball between hands.  Toss ball in air and catch with the same hand.  Flip cards 1 at a time as fast as you can.  Pick up coins, buttons, marbles, dried beans/pasta of different sizes and place in container.  Pick up coins and place in container or coin bank.  Pick up coins and stack.  Screw together nuts and bolts, then unfasten.

## 2015-08-27 NOTE — Therapy (Signed)
[facility] [address] [place], [place], [address] Phone: [phone]   Fax:  [phone]  Physical Therapy Treatment  Patient Details  Name: Donald [name] MRN: [id number] Date of Birth: Mar 27, 1946 Referring Provider: Ernestine Mcmurray, NP  Encounter Date: 08/27/2015      PT End of Session - 08/27/15 1912    Visit Number 5   Number of Visits 13   Date for PT Re-Evaluation 09/22/15   Authorization Type Medicare primary; [employer] secondary - G Codes and PN required every 10 visits   PT Start Time 1531   PT Stop Time 1622   PT Time Calculation (min) 51 min   Activity Tolerance Patient tolerated treatment well   Behavior During Therapy [facility] for tasks assessed/performed      Past Medical History  Diagnosis Date  . Hypertension   . Diabetes mellitus without complication ([place])   . Hyperlipemia   . large right middle cerebral artery infarct, embolic 08/02/4399    a. s/p IV tPA, b. source unknown, c. loop recorder placed  . Snoring 07/03/2015    Past Surgical History  Procedure Laterality Date  . Tee without cardioversion N/A 06/07/2013    Procedure: TRANSESOPHAGEAL ECHOCARDIOGRAM (TEE);  Surgeon: [name], MD;  Location: [facility] ENDOSCOPY;  Service: Cardiovascular;  Laterality: N/A;  . Loop recorder implant  06/07/2013    MDT LinQ implanted by Dr [name] for cryptogenic stroke  . Loop recorder implant N/A 06/07/2013    Procedure: LOOP RECORDER IMPLANT;  Surgeon: [name], MD;  Location: [place] CATH LAB;  Service: Cardiovascular;  Laterality: N/A;    There were no vitals filed for this visit.  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   PMH significant for: CVA (large R MCA infarct, embolic),  seizures, HTN, HLD, DM, persistent A-Fib   Patient Stated Goals Work on walking downstairs, work on walking technique to make it better, and to work on my reaction time when I lose my balance to my left side.   Currently in Pain? No/denies                         [facility] Adult PT Treatment/Exercise - 08/27/15 0001    Transfers   Transfers Sit to Stand;Stand to Sit   Sit to Stand 5: Supervision   Sit to Stand Details (indicate cue type and reason) cueing for LE positioningto promote symmetrical WB.   Stand to Sit 6: Modified independent (Device/Increase time)   Ambulation/Gait   Ambulation/Gait Yes   Ambulation/Gait Assistance 5: Supervision;6: Modified independent (Device/Increase time)   Ambulation/Gait Assistance Details Gait x210' over level, indoor surfaces with mod I, noted between-session carryover of cueing to address L forefoot supination during LLE advancement and L stance as well as less trunk extension to initiate LLE advancement. Gait x300' over unlevel, paved surfaces then x200' over unlevel, grass surfaces with (S) due to noted M/L ankle instability.  no AD indoors; [facility]  outdoors   Ambulation Distance (Feet) 710 Feet  total   Assistive device Straight cane;Parallel bars   Gait Pattern Step-through pattern;Decreased arm swing - left;Decreased dorsiflexion - left;Left circumduction;Decreased hip/knee flexion - left   Ambulation Surface Level;Unlevel;Indoor;Outdoor;Paved;Grass   Stairs Yes   Stairs Assistance 5: Supervision;4: Min guard   Stairs Assistance Details (indicate cue type and reason) Supervision to ascend with RLE, descend with LLE. During descent, noted L ankle instabiliy (difficulty controlling L forefoot  supination). During subsequent trial, attempted to descent with RLE leading; min guard at L tibia during descent due to decreased eccetric control in L qquadriceps.  initial trial with SPC; subsequent trial with R rail   Stair Management Technique One rail Right;Step to pattern;Forwards;With cane   Number of Stairs 8   Height of Stairs 6   Pre-Gait Activities LUE anterolateral reaching during LLE stance to promote L trunk elongation during LLE WB.   Gait Comments Additional gait deviations noted: limited L ankle PF during L terminal stance; L trunk shortening during L stance phase.   Self-Care   Self-Care Other Self-Care Comments   Other Self-Care Comments  Discussed UCBL orthotic as option to control L ankle M/L instability during gait. Showed pt pictures and explained process for getting custom orthotic. All in agreement to continue gait training and discuss again in 2 weeks.   Neuro Re-ed    Neuro Re-ed Details  Tall kneeling: LUE lateral reaching to increase anterior and lateral weight shift, to increase L pelvic retraction, to promote L gluteus maximus/medius activation, and to facilitate L trunk elongation during LLE WB. Attempted half L tall kneeling (pt standing with RLE on ground and L knee on mat table) with reaching in attempt to promote L trunk elongation, L pelvic protraction; however, pt unable to perform dynamic activity in this position.                PT Education - 08/27/15 1852    Education provided Yes   Education Details Discussed UCBL orthotic to control M/L instability at L ankle.    Person(s) Educated Patient;Spouse   Methods Explanation   Comprehension Verbalized understanding          PT Short Term Goals - 08/27/15 1921    PT SHORT TERM GOAL #1   Title Pt will perform initial HEP with mod I using paper handout to indicate safe daily compliance with home program.   (Target date: 08/29/15)  Correction: Target date 08/29/15   Status On-going   PT SHORT TERM  GOAL #2   Title Complete FGA and improve score by 4 points to indicate improved dynamic gait stability.   (Target date: 08/29/15)   Baseline 08/15/15: baseline 11/30 today.   Status On-going   PT SHORT TERM GOAL #3   Title Pt will improve gait velocity from 1.79 ft/sec to > / = 2.09 ft/sec to indicate decreased risk of recurrent falls.   (Target date: 08/29/15)   Status On-going   PT SHORT TERM GOAL #4   Title Pt will negotiate 2 stairs without rails with mod I using LRAD to indicate increased independence using primary home entrance.  (Target date: 08/29/15)   Status On-going   PT SHORT TERM GOAL #5   Title Pt will ambulate > 300' over level, indoor surfaces without AFO with no evidence of L ankle instability (excessive forefoot supination) to indicate increased safety with ambulation.   (Target date: 08/29/15)   Baseline Met 3/27.   Status Achieved  PT SHORT TERM GOAL #6   Title Pt will ambulate 50' over grass surfaces with supervision using LRAD to increase pt safety attending grandson's soccer games.   (Target date: 08/29/15)   Baseline Met 3/29.   Status Achieved   PT SHORT TERM GOAL #7   Title Pt will perform floor transfer (supine on floor > standing) with min A without use of external aid to indicate increased safety with fall recovery.   (Target date: 08/29/15)           PT Long Term Goals - 08/08/15 2010    PT LONG TERM GOAL #1   Title Pt will improve gait velocity from 1.79 ft/sec to > / = 2.39 ft/sec to indicate increased efficiency of ambulation.   (Target date: 09/19/15)   PT LONG TERM GOAL #2   Title Pt will score > / = 19/30 on FGA to indicate decreased fall risk.   (Target date: 09/19/15)   PT LONG TERM GOAL #3   Title Pt will ambulate > 500' over unlevel, paved surfaces without AFO with mod I using LRAD with no L ankle instability to indicate safety with community mobility.  (Target date: 09/19/15)   PT LONG TERM GOAL #4   Title Pt will ambulate 44' over unlevel, grass  surfaces with mod I using LRAD to indicate increased independence attending grandson's soccer games.    (Target date: 09/19/15)   PT LONG TERM GOAL #5   Title Pt will perform floor transfer (supine on floor > standing) with supervision without use of external aid to indicate increased safety with fall recovery.   (Target date: 09/19/15)   Additional Long Term Goals   Additional Long Term Goals Yes   PT LONG TERM GOAL #6   Title Pt will negotiate 4 stairs without rails with mod I using LRAD to indicate increased access to community.   (Target date: 09/19/15)               Plan - 08/27/15 1916    Clinical Impression Statement Session focused on gait training and NMR with emphasis on L trunk elongation during L stance phase of gait. Noted effective between-session carryover of cueing to address L forefoot supination and trunk extension to initiate LLE advancement. Discussed possibility of UCBL orthotic to control M/L ankle instabilty; all in agreement to continue gait training and discuss in 2 weeks. Pt met STG's for gait over grass with (S) and for gait over level, indoor surfaces without ankle instabiliy.   Pt will benefit from skilled therapeutic intervention in order to improve on the following deficits Abnormal gait;Decreased balance;Decreased coordination;Increased edema;Impaired flexibility;Impaired sensation;Impaired tone;Decreased strength   Rehab Potential Good   Clinical Impairments Affecting Rehab Potential Time since CVA (> 2 years)   PT Frequency 2x / week   PT Duration 6 weeks   PT Treatment/Interventions ADLs/Self Care Home Management;DME Instruction;Gait training;Stair training;Functional mobility training;Therapeutic activities;Therapeutic exercise;Balance training;Orthotic Fit/Training;Patient/family education;Neuromuscular re-education;Vestibular   PT Next Visit Plan * Finish checking STG's. Continue NMR with emphasis on  L trunk elongation, L lateral weight shift, L gluteus  medius/max activation in L stance.    Consulted and Agree with Plan of Care Patient;Family member/caregiver   Family Member Consulted wife, [name]        Problem List Patient Active Problem List   Diagnosis Date Noted  . Snoring 07/03/2015  . Persistent atrial fibrillation ([place]) 04/16/2015  . Hypokalemia 09/20/2014  . HTN (hypertension) 09/19/2014  . HLD (hyperlipidemia) 09/19/2014  .  Diabetes mellitus without complication ([place]) 53/66/4403  . Jerking 09/19/2014  . Alterations of sensations, late effect of cerebrovascular disease 11/07/2013  . Disturbances of vision, late effect of cerebrovascular disease 11/07/2013  . Sinus bradycardia 08/08/2013  . 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[place]) 07/26/2013  . Left-sided neglect 07/26/2013  . Small vessel disease ([place]) 06/07/2013  . Obesity, unspecified 06/07/2013  . CVA (cerebral infarction) 06/07/2013  . large right middle cerebral artery infarct, embolic 47/42/5956  . Hypertension 06/03/2013  . Diabetes ([place]) 06/03/2013   Billie Ruddy, PT, DPT [facility] [address] [place], [place], [address] Phone: [phone]   Fax:  [phone] 08/27/2015, 7:26 PM  Name: Donald [name] MRN: [id number] Date of Birth: May 14, 1946

## 2015-08-29 ENCOUNTER — Ambulatory Visit: Payer: Medicare Other | Admitting: Occupational Therapy

## 2015-08-29 ENCOUNTER — Ambulatory Visit: Payer: Medicare Other | Admitting: Physical Therapy

## 2015-08-29 ENCOUNTER — Encounter: Payer: Self-pay | Admitting: Occupational Therapy

## 2015-08-29 DIAGNOSIS — R279 Unspecified lack of coordination: Secondary | ICD-10-CM | POA: Diagnosis not present

## 2015-08-29 DIAGNOSIS — I6980 Unspecified sequelae of other cerebrovascular disease: Secondary | ICD-10-CM | POA: Diagnosis not present

## 2015-08-29 DIAGNOSIS — R2681 Unsteadiness on feet: Secondary | ICD-10-CM | POA: Diagnosis not present

## 2015-08-29 DIAGNOSIS — IMO0002 Reserved for concepts with insufficient information to code with codable children: Secondary | ICD-10-CM

## 2015-08-29 DIAGNOSIS — I69359 Hemiplegia and hemiparesis following cerebral infarction affecting unspecified side: Secondary | ICD-10-CM

## 2015-08-29 DIAGNOSIS — I69898 Other sequelae of other cerebrovascular disease: Secondary | ICD-10-CM | POA: Diagnosis not present

## 2015-08-29 DIAGNOSIS — I69998 Other sequelae following unspecified cerebrovascular disease: Secondary | ICD-10-CM

## 2015-08-29 DIAGNOSIS — I69354 Hemiplegia and hemiparesis following cerebral infarction affecting left non-dominant side: Secondary | ICD-10-CM | POA: Diagnosis not present

## 2015-08-29 DIAGNOSIS — R208 Other disturbances of skin sensation: Secondary | ICD-10-CM | POA: Diagnosis not present

## 2015-08-29 DIAGNOSIS — R2689 Other abnormalities of gait and mobility: Secondary | ICD-10-CM

## 2015-08-29 DIAGNOSIS — R209 Unspecified disturbances of skin sensation: Secondary | ICD-10-CM

## 2015-08-29 DIAGNOSIS — G8114 Spastic hemiplegia affecting left nondominant side: Secondary | ICD-10-CM

## 2015-08-29 NOTE — Therapy (Signed)
[facility] [address] [place], [place], [address] Phone: [phone]   Fax:  [phone]  Occupational Therapy Treatment  Patient Details  Name: Donald [name] MRN: [id number] Date of Birth: 1946/04/10 Referring Provider: Dr. Margette Fast  Encounter Date: 08/29/2015      OT End of Session - 08/29/15 2207    Visit Number 5   Number of Visits 17   Date for OT Re-Evaluation 10/03/15   Authorization Type Medicare   Authorization Time Period Progress note / gcode 10th visit   Authorization - Visit Number 5   Authorization - Number of Visits 10   OT Start Time [id number]   OT Stop Time 1620   OT Time Calculation (min) 45 min   Activity Tolerance Patient tolerated treatment well   Behavior During Therapy [facility] for tasks assessed/performed      Past Medical History  Diagnosis Date  . Hypertension   . Diabetes mellitus without complication ([place])   . Hyperlipemia   . large right middle cerebral artery infarct, embolic [id number]    a. s/p IV tPA, b. source unknown, c. loop recorder placed  . Snoring 07/03/2015    Past Surgical History  Procedure Laterality Date  . Tee without cardioversion N/A 06/07/2013    Procedure: TRANSESOPHAGEAL ECHOCARDIOGRAM (TEE);  Surgeon: [name], MD;  Location[facility] ENDOSCOPY;  Service: Cardiovascular;  Laterality: N/A;  . Loop recorder implant  06/07/2013    MDT LinQ implanted by Dr [name] for cryptogenic stroke  . Loop recorder implant N/A 06/07/2013    Procedure: LOOP RECORDER IMPLANT;  Surgeon: [name], MD;  Location: [place] CATH LAB;  Service: Cardiovascular;  Laterality: N/A;    There were no vitals filed for this visit.  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     Patient is accompained by: Family member   Pertinent History Afib, seizure 09/2014,    Patient Stated Goals Play guitar    Currently in Pain? No/denies                      OT Treatments/Exercises (OP) - 08/29/15 2159    Neurological Re-education Exercises   Other Exercises 1 Neuro-reeducation to address active use of left UE in high reach with emphasis on directionality of reach. Patient with tendency to pull down versus reach up for overhead reach initially.  Patient able to adjust to cueing.  In standing, working to address lengthening throughout left side for overhead reach.  Patient with tendency to stand with shoulders behind hips, and significant overactive head / neck muscles. Patient able to respond to novel cueing initially  although returns to patterned movement with increased difficulty or challenge.     Other Exercises 2 Worked with functional use of UE's during gait.  Patient has difficulty carrying items in left hand while walking.  Patient has difficulty controlling arm / hand position as well as step pattern, alignment during task, e.g. carrying mug of water, tossing a ball.                  OT Education - 08/29/15 2206  Education provided Yes   Education Details alignment of trunk for standing and stepping, alignment for UE during walking   Person(s) Educated Patient;Spouse   Methods Explanation;Demonstration;Verbal cues   Comprehension Verbalized understanding;Verbal cues required          OT Short Term Goals - 08/27/15 1731    OT SHORT TERM GOAL #1   Title Patient will complete home exercise program independently due 09/13/15   OT [place] #2   Title Patient will demonstrate improved coordination in left hand as evidenced by decrease time on 9 hole peg test by 10 seconds   Status On-going   OT SHORT TERM GOAL #3    Title Patient will demonstrate ability to support neck of guitar with left hand while seated, and demonstrate adequate finger control to form 1-2 cord positions with increased time   Status On-going   OT SHORT TERM GOAL #4   Title Patient will adequately use bilateral hands to slice a soft fruit or vegetable (tomatao) without incident with minimal environmental set up and cueing   Status On-going   OT SHORT TERM GOAL #5   Title Patient will demonstrate adequate strength and control of left UE to lift an object weighing up to 3 lb weight into overhead shelf    Status On-going           OT Long Term Goals - 08/27/15 1731    OT LONG TERM GOAL #1   Title Patient will complete update home exercise / home activity program independently (due 10/03/15)   Status On-going   OT LONG TERM GOAL #2   Title Patient will demonstrate improved fine motor coordination in left UE as evidenced by 20 second reduction in time on 9 hole peg test   Status On-going   OT LONG TERM GOAL #3   Title Patient will adjust left hand position on neck of guitar for 3-4 chord changes in time with simple song.   Status On-going   OT LONG TERM GOAL #4   Title Patient will unload dishwasher lifting 2-3 plates at once (or comparable weight)  into overhead shelf    Status On-going   OT LONG TERM GOAL #5   Title Patient will carry beverage in left hand  while walking in community at least 150 feet,using cane in right hand in minimally crowded / congested environment    Status On-going               Plan - 08/29/15 2207    Clinical Impression Statement Patient progressing with overhead reach   Pt will benefit from skilled therapeutic intervention in order to improve on the following deficits (Retired) Decreased activity tolerance;Decreased balance;Decreased cognition;Decreased coordination;Decreased range of motion;Decreased mobility;Decreased strength;Difficulty walking;Impaired perceived functional ability;Improper  body mechanics;Impaired UE functional use;Impaired tone;Impaired sensation   Rehab Potential Good   OT Frequency 2x / week   OT Duration 8 weeks   OT Treatment/Interventions Self-care/ADL training;Ultrasound;Therapeutic exercise;Neuromuscular education;Manual Therapy;Functional Mobility Training;DME and/or AE instruction;Splinting;Therapeutic exercises;Therapeutic activities;Balance training;Patient/family education   Plan NMR for LUE, Patient to bring in Venturia Initiated 08/21/15, reviewed 08/22/15   Consulted and Agree with Plan of Care Patient;Family member/caregiver   Family Member Consulted wife [name]        Problem List Patient Active Problem List   Diagnosis Date Noted  . Snoring 07/03/2015  . Persistent atrial fibrillation ([place]) 04/16/2015  . Hypokalemia 09/20/2014  . HTN (hypertension) 09/19/2014  . HLD (hyperlipidemia) 09/19/2014  .  Diabetes mellitus without complication ([place]) [id number]  . Jerking 09/19/2014  . Alterations of sensations, late effect of cerebrovascular disease 11/07/2013  . Disturbances of vision, late effect of cerebrovascular disease 11/07/2013  . Sinus bradycardia 08/08/2013  . 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[place]) 07/26/2013  . Left-sided neglect 07/26/2013  . Small vessel disease ([place]) 06/07/2013  . Obesity, unspecified 06/07/2013  . CVA (cerebral infarction) 06/07/2013  . large right middle cerebral artery infarct, embolic [id number]  . Hypertension 06/03/2013  . Diabetes ([place]) 06/03/2013    Mariah Milling, OTR/L 08/29/2015, 10:09 PM  [facility] [address] [place], [place], [address] Phone: [phone]   Fax:  [phone]  Name: Donald [name] MRN: [id number] Date of Birth: 1946/01/17

## 2015-08-29 NOTE — Therapy (Addendum)
[facility] [address] [place], [place], [address] Phone: [phone]   Fax:  [phone]  Physical Therapy Treatment  Patient Details  Name: Donald [name] MRN: [id number] Date of Birth: 07/29/1945 Referring Provider: Ernestine Mcmurray, NP  Encounter Date: 08/29/2015      PT End of Session - 08/29/15 2026    Visit Number 6   Number of Visits 13   Date for PT Re-Evaluation 09/22/15   Authorization Type Medicare primary; [employer] secondary - G Codes and PN required every 10 visits   PT Start Time 1452  Pt arrived late to session   PT Stop Time 1530   PT Time Calculation (min) 38 min   Activity Tolerance Patient tolerated treatment well   Behavior During Therapy [facility] for tasks assessed/performed      Past Medical History  Diagnosis Date  . Hypertension   . Diabetes mellitus without complication ([place])   . Hyperlipemia   . large right middle cerebral artery infarct, embolic 08/08/7562    a. s/p IV tPA, b. source unknown, c. loop recorder placed  . Snoring 07/03/2015    Past Surgical History  Procedure Laterality Date  . Tee without cardioversion N/A 06/07/2013    Procedure: TRANSESOPHAGEAL ECHOCARDIOGRAM (TEE);  Surgeon: [name], MD;  Location: [facility] ENDOSCOPY;  Service: Cardiovascular;  Laterality: N/A;  . Loop recorder implant  06/07/2013    MDT LinQ implanted by Dr [name] for cryptogenic stroke  . Loop recorder implant N/A 06/07/2013    Procedure: LOOP RECORDER IMPLANT;  Surgeon: [name], MD;  Location: [facility] CATH LAB;  Service: Cardiovascular;  Laterality: N/A;    There were no vitals filed for this visit.  Visit Diagnosis:  - Spastic hemiplegia affecting left nondominant side  - Other abnormalities of gait and mobility - Unsteadiness on feet - Unspecified lack of coordination - Other disturbances of skin sensation      Subjective Assessment - 08/29/15 1456    Subjective Pt reports he  feels "a little off today." Feels tired due to having gone a lot of places today.    Patient is accompained by: Family member  wife, [name]   Pertinent History Pt likes to be called "[name]".   PMH significant for: CVA (large R MCA infarct, embolic),  seizures, HTN, HLD, DM, persistent A-Fib   Patient Stated Goals Work on walking downstairs, work on walking technique to make it better, and to work on my reaction time when I lose my balance to my left side.   Currently in Pain? No/denies                         [facility] Adult PT Treatment/Exercise - 08/29/15 0001    Transfers   Transfers Sit to Stand;Stand to Sit   Sit to Stand 6: Modified independent (Device/Increase time)   Stand to Sit 6: Modified independent (Device/Increase time)   Floor to Transfer 4: Min guard;5: Supervision   Floor to Transfer Details (indicate cue type and reason) During initial 2 trials of floor transfer, pt required min guard, 25-50% cueing for technique and for RLE joint protection due to limited LLE proproiception/kinesthetic awareness. Pt perfomed final floor transfer with (S) and without cueing.   Ambulation/Gait   Ambulation/Gait Yes   Ambulation/Gait Assistance 6: Modified independent (Device/Increase time)   Ambulation/Gait Assistance Details Noted the following improvements in gait pattern since PT evaluation: decreased LLE circumduction, improved control of L  forefoot supination during L stance; increased selective control of L hip/knee flexion during LLE advancement. Noted less flexor tone in LUE during gait. Although pt demonstrates less trunk extension to initiate LLE advancement, continues to exhibit limited anterior weight shift during gait.   Ambulation Distance (Feet) 210 Feet   Assistive device Straight cane;Parallel bars   Gait Pattern Step-through pattern;Decreased arm swing - left;Decreased dorsiflexion - left;Left circumduction;Decreased hip/knee flexion - left   Ambulation Surface  Level;Indoor   Gait velocity 2.33 ft/sec   Stairs Yes   Stairs Assistance 6: Modified independent (Device/Increase time)   Stairs Assistance Details (indicate cue type and reason) Pt very slow and intentional, but safe negotiating stairs without rail using SPC.   Stair Management Technique No rails;Step to pattern;Forwards;With cane   Number of Stairs 8   Height of Stairs 6   Self-Care   Self-Care Other Self-Care Comments   Other Self-Care Comments  Discussed fall recovery with emphasis on situations in which to activate EMS (if pt injured, head trauma/LOC, suspected CVA. Pt verbalized understanding.   Neuro Re-ed    Neuro Re-ed Details  Transferred from seated on floor <> supine on mat table x3 trials total. Transitional movements and WB positions (quadruped <> half kneeling <> standing) performed to facilitate LLE sensory/kinesthetic awareness and to promote both proximal stability (tall kneeling) and distal stability (half kneeling) in LLE as well as LE dissociation, functional weight shifting.                PT Education - 08/29/15 2005    Education provided Yes   Education Details Fall recovery. Situations in which pt should activate EMS or call for help rather than attempting to get up by himself. STG's and progress.   Person(s) Educated Patient;Spouse   Methods Explanation;Demonstration;Verbal cues   Comprehension Verbalized understanding;Returned demonstration          PT Short Term Goals - 08/29/15 1458    PT SHORT TERM GOAL #1   Title Pt will perform initial HEP with mod I using paper handout to indicate safe daily compliance with home program.   (Target date: 08/29/15)  Correction: Target date 08/29/15   Baseline Met 3/29.   Status Achieved   PT SHORT TERM GOAL #2   Title Complete FGA and improve score by 4 points to indicate improved dynamic gait stability.   (Target date: 08/29/15)   Baseline 08/15/15: baseline 11/30 today.   Status On-going   PT SHORT TERM GOAL  #3   Title Pt will improve gait velocity from 1.79 ft/sec to > / = 2.09 ft/sec to indicate decreased risk of recurrent falls.   (Target date: 08/29/15)   Baseline 3/29: gait velocity = 2.33 ft/sec   Status Achieved   PT SHORT TERM GOAL #4   Title Pt will negotiate 2 stairs without rails with mod I using LRAD to indicate increased independence using primary home entrance.  (Target date: 08/29/15)   Baseline Met 3/29 using SPC.   Status Achieved   PT SHORT TERM GOAL #5   Title Pt will ambulate > 300' over level, indoor surfaces without AFO with no evidence of L ankle instability (excessive forefoot supination) to indicate increased safety with ambulation.   (Target date: 08/29/15)   Baseline Met 3/27.   Status Achieved   PT SHORT TERM GOAL #6   Title Pt will ambulate 50' over grass surfaces with supervision using LRAD to increase pt safety attending grandson's soccer games.   (Target date: 08/29/15)  Baseline Met 3/27.   Status Achieved   PT SHORT TERM GOAL #7   Title Pt will perform floor transfer (supine on floor > standing) with min A without use of external aid to indicate increased safety with fall recovery.   (Target date: 08/29/15)   Baseline 3/29: attempted for the first time with use of external aid (mat table); however, pt performed final trial with (S).   Status Partially Met           PT Long Term Goals - 08/08/15 2010    PT LONG TERM GOAL #1   Title Pt will improve gait velocity from 1.79 ft/sec to > / = 2.39 ft/sec to indicate increased efficiency of ambulation.   (Target date: 09/19/15)   PT LONG TERM GOAL #2   Title Pt will score > / = 19/30 on FGA to indicate decreased fall risk.   (Target date: 09/19/15)   PT LONG TERM GOAL #3   Title Pt will ambulate > 500' over unlevel, paved surfaces without AFO with mod I using LRAD with no L ankle instability to indicate safety with community mobility.  (Target date: 09/19/15)   PT LONG TERM GOAL #4   Title Pt will ambulate 76' over  unlevel, grass surfaces with mod I using LRAD to indicate increased independence attending grandson's soccer games.    (Target date: 09/19/15)   PT LONG TERM GOAL #5   Title Pt will perform floor transfer (supine on floor > standing) with supervision without use of external aid to indicate increased safety with fall recovery.   (Target date: 09/19/15)   Additional Long Term Goals   Additional Long Term Goals Yes   PT LONG TERM GOAL #6   Title Pt will negotiate 4 stairs without rails with mod I using LRAD to indicate increased access to community.   (Target date: 09/19/15)               Plan - 08/29/15 2017    Clinical Impression Statement Pt has met 5 of 6 assessed STG's; STG for floor transfer without external aid with min A partially met due to having assessed only with use of external aid (mat table). FGA goal to be assessed at next session. Pt has demonstrated excellent carryover of cueing for gait pattern; more normalized weight shift has reduced hypertonia in both LUE and LLE. Continue per POC.   Pt will benefit from skilled therapeutic intervention in order to improve on the following deficits Abnormal gait;Decreased balance;Decreased coordination;Increased edema;Impaired flexibility;Impaired sensation;Impaired tone;Decreased strength   Rehab Potential Good   Clinical Impairments Affecting Rehab Potential Time since CVA (> 2 years)   PT Frequency 2x / week   PT Duration 6 weeks   PT Treatment/Interventions ADLs/Self Care Home Management;DME Instruction;Gait training;Stair training;Functional mobility training;Therapeutic activities;Therapeutic exercise;Balance training;Orthotic Fit/Training;Patient/family education;Neuromuscular re-education;Vestibular   PT Next Visit Plan * Finish checking STG's (FGA; talk to pt about desire to perform floor transfer without external aid?). Continue NMR with emphasis on  L trunk elongation, L lateral weight shift, L gluteus medius/max activation in L  stance.    Consulted and Agree with Plan of Care Patient;Family member/caregiver   Family Member Consulted wife, [name]        Problem List Patient Active Problem List   Diagnosis Date Noted  . Snoring 07/03/2015  . Persistent atrial fibrillation ([place] [address] [place]) 04/16/2015  . Hypokalemia 09/20/2014  . HTN (hypertension) 09/19/2014  . HLD (hyperlipidemia) 09/19/2014  . Diabetes mellitus without complication ([place]) 67/05/4579  .  Jerking 09/19/2014  . Alterations of sensations, late effect of cerebrovascular disease 11/07/2013  . Disturbances of vision, late effect of cerebrovascular disease 11/07/2013  . Sinus bradycardia 08/08/2013  . 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[place]) 07/26/2013  . Left-sided neglect 07/26/2013  . Small vessel disease ([place]) 06/07/2013  . Obesity, unspecified 06/07/2013  . CVA (cerebral infarction) 06/07/2013  . large right middle cerebral artery infarct, embolic 23/36/1224  . Hypertension 06/03/2013  . Diabetes ([place]) 06/03/2013    Billie Ruddy, PT, DPT [facility] [address] [place], [place], [address] Phone: [phone]   Fax:  [phone] 08/29/2015, 8:29 PM  Name: Donald [name] MRN: [id number] Date of Birth: 11/06/1945

## 2015-08-30 ENCOUNTER — Telehealth: Payer: Self-pay | Admitting: *Deleted

## 2015-08-30 ENCOUNTER — Telehealth: Payer: Self-pay | Admitting: Neurology

## 2015-08-30 ENCOUNTER — Other Ambulatory Visit: Payer: Self-pay | Admitting: Adult Health

## 2015-08-30 ENCOUNTER — Other Ambulatory Visit: Payer: Self-pay

## 2015-08-30 MED ORDER — LEVETIRACETAM ER 500 MG PO TB24: ORAL_TABLET | ORAL | Status: DC

## 2015-08-30 NOTE — Telephone Encounter (Signed)
Medication refill done for 6 refills.

## 2015-08-30 NOTE — Telephone Encounter (Signed)
Patient called to request prescription for levETIRAcetam (KEPPRA XR) 500 MG 24 hr tablet from the time existing rx will run out and new one would be needed, states he doesn't need the medicine now but Rx will run out before he is seen at next visit.

## 2015-08-30 NOTE — Telephone Encounter (Signed)
Patient called and left a voicemail about Eliquis but was looking at the wrong bottle for refills. Called [facility] to confirm refills.

## 2015-08-31 ENCOUNTER — Other Ambulatory Visit: Payer: Self-pay | Admitting: Adult Health

## 2015-08-31 MED ORDER — LEVETIRACETAM ER 500 MG PO TB24: ORAL_TABLET | ORAL | Status: DC

## 2015-09-02 LAB — CUP PACEART REMOTE DEVICE CHECK: Date Time Interrogation Session: 20170201083557

## 2015-09-02 NOTE — Progress Notes (Signed)
Carelink summary report received. Battery status OK. Normal device function. No new symptom episodes, tachy episodes, brady, or pause episodes. 1 AF 100% of the time, good histogram, +Eliquis. Monthly summary reports and ROV/PRN

## 2015-09-03 ENCOUNTER — Ambulatory Visit (INDEPENDENT_AMBULATORY_CARE_PROVIDER_SITE_OTHER): Payer: Medicare Other | Admitting: *Deleted

## 2015-09-03 DIAGNOSIS — I639 Cerebral infarction, unspecified: Secondary | ICD-10-CM

## 2015-09-03 NOTE — Progress Notes (Signed)
Carelink Summary Report / Loop Recorder 

## 2015-09-04 ENCOUNTER — Ambulatory Visit: Payer: Medicare Other | Admitting: Physical Therapy

## 2015-09-04 ENCOUNTER — Ambulatory Visit: Payer: Medicare Other | Admitting: Occupational Therapy

## 2015-09-06 ENCOUNTER — Encounter: Payer: Self-pay | Admitting: Occupational Therapy

## 2015-09-06 ENCOUNTER — Ambulatory Visit: Payer: Medicare Other | Attending: Neurology | Admitting: Physical Therapy

## 2015-09-06 ENCOUNTER — Ambulatory Visit: Payer: Medicare Other | Admitting: Occupational Therapy

## 2015-09-06 ENCOUNTER — Encounter: Payer: Self-pay | Admitting: Physical Therapy

## 2015-09-06 DIAGNOSIS — R278 Other lack of coordination: Secondary | ICD-10-CM | POA: Diagnosis not present

## 2015-09-06 DIAGNOSIS — G8114 Spastic hemiplegia affecting left nondominant side: Secondary | ICD-10-CM | POA: Diagnosis not present

## 2015-09-06 DIAGNOSIS — R2689 Other abnormalities of gait and mobility: Secondary | ICD-10-CM | POA: Insufficient documentation

## 2015-09-06 DIAGNOSIS — R209 Unspecified disturbances of skin sensation: Principal | ICD-10-CM

## 2015-09-06 DIAGNOSIS — R2681 Unsteadiness on feet: Secondary | ICD-10-CM | POA: Insufficient documentation

## 2015-09-06 DIAGNOSIS — R208 Other disturbances of skin sensation: Secondary | ICD-10-CM | POA: Diagnosis not present

## 2015-09-06 DIAGNOSIS — I69898 Other sequelae of other cerebrovascular disease: Secondary | ICD-10-CM | POA: Insufficient documentation

## 2015-09-06 DIAGNOSIS — I69354 Hemiplegia and hemiparesis following cerebral infarction affecting left non-dominant side: Secondary | ICD-10-CM | POA: Insufficient documentation

## 2015-09-06 DIAGNOSIS — R279 Unspecified lack of coordination: Secondary | ICD-10-CM | POA: Insufficient documentation

## 2015-09-06 DIAGNOSIS — I69998 Other sequelae following unspecified cerebrovascular disease: Secondary | ICD-10-CM

## 2015-09-06 NOTE — Therapy (Signed)
[facility] [address] [place], [place], [address] Phone: [phone]   Fax:  [phone]  Occupational Therapy Treatment  Patient Details  Name: Donald [name] MRN: [id number] Date of Birth: Jan 07, 1946 Referring Provider: Dr. Margette Fast  Encounter Date: 09/06/2015      OT End of Session - 09/06/15 2008    Visit Number 6   Number of Visits 17   Date for OT Re-Evaluation 10/03/15   Authorization Time Period Progress note / gcode 10th visit   Authorization - Visit Number 6   Authorization - Number of Visits 10   OT Start Time [id number]   OT Stop Time 1618   OT Time Calculation (min) 43 min   Activity Tolerance Patient tolerated treatment well   Behavior During Therapy [facility] for tasks assessed/performed      Past Medical History  Diagnosis Date  . Hypertension   . Diabetes mellitus without complication ([place])   . Hyperlipemia   . large right middle cerebral artery infarct, embolic [id number]    a. s/p IV tPA, b. source unknown, c. loop recorder placed  . Snoring 07/03/2015    Past Surgical History  Procedure Laterality Date  . Tee without cardioversion N/A 06/07/2013    Procedure: TRANSESOPHAGEAL ECHOCARDIOGRAM (TEE);  Surgeon: [name], MD;  Location: [facility] ENDOSCOPY;  Service: Cardiovascular;  Laterality: N/A;  . Loop recorder implant  06/07/2013    MDT LinQ implanted by Dr [name] for cryptogenic stroke  . Loop recorder implant N/A 06/07/2013    Procedure: LOOP RECORDER IMPLANT;  Surgeon: [name], MD;  Location: [facility] CATH LAB;  Service: Cardiovascular;  Laterality: N/A;    There were no vitals filed for this visit.      Subjective Assessment - 09/06/15 1958    Subjective  I did nothing for four days, I was so sick.  I am just starting to feel better.   Patient is accompained by: Family member   Pertinent History Afib, seizure 09/2014,    Patient Stated Goals Play guitar    Currently in Pain?  No/denies   Multiple Pain Sites No            [facility] OT Assessment - 09/06/15 0001    Coordination   Left 9 Hole Peg Test 58.02 3rd attempt                  OT Treatments/Exercises (OP) - 09/06/15 0001    Neurological Re-education Exercises   Other Exercises 1 Neuromuscular reeducation to address patient's ability to move left arm in isolation versus in patterned movement with head/neck/trunk.  Patient fatigued after nearly week long illness.  Patient actively pulling down in upper extremity with attempts to stabilize trunk rib cage - needed moderate cueing and facilitation for coordinated arm and trunk motion,      Other Exercises 2 Worked to establish arm in space- control in modified closed chain condition.  Patient with improved ability to grade muscle activity when arm in visual range - although patient reporting little sensory deficit in left arm.  Patient with poor ability to grade motor activity with directional changes.                 OT Education - 09/06/15 2007    Education provided Yes   Education Details ability to separate arm and trunk motion   Person(s) Educated Patient;Spouse   Methods Explanation;Demonstration;Tactile cues;Verbal cues   Comprehension Verbalized understanding;Need further instruction;Tactile cues required  OT Short Term Goals - 09/06/15 09/23/08    OT SHORT TERM GOAL #1   Title Patient will complete home exercise program independently due 09/13/15   Status On-going   OT SHORT TERM GOAL #2   Title Patient will demonstrate improved coordination in left hand as evidenced by decrease time on 9 hole peg test by 10 seconds   Baseline 1 MIN 28 SEC;    Status Achieved  58.02 SEC   OT SHORT TERM GOAL #3   Title Patient will demonstrate ability to support neck of guitar with left hand while seated, and demonstrate adequate finger control to form 1-2 cord positions with increased time   Status On-going   OT SHORT TERM GOAL #4    Title Patient will adequately use bilateral hands to slice a soft fruit or vegetable (tomatao) without incident with minimal environmental set up and cueing   Status On-going           OT Long Term Goals - 09/06/15 [place] #1   Title Patient will complete update home exercise / home activity program independently (due 10/03/15)   Status On-going   OT LONG TERM GOAL #2   Title Patient will demonstrate improved fine motor coordination in left UE as evidenced by 20 second reduction in time on 9 hole peg test   OT LONG TERM GOAL #3   Title Patient will adjust left hand position on neck of guitar for 3-4 chord changes in time with simple song.   Status On-going   OT LONG TERM GOAL #4   Title Patient will unload dishwasher lifting 2-3 plates at once (or comparable weight)  into overhead shelf    Status On-going   OT LONG TERM GOAL #5   Title Patient will carry beverage in left hand  while walking in community at least 150 feet,using cane in right hand in minimally crowded / congested environment    Status On-going               Plan - 09/06/15 09-24-07    Clinical Impression Statement Patient progressing toward short term goals, and benefitting from current OT program   Rehab Potential Good   OT Frequency 2x / week   OT Duration 8 weeks   OT Treatment/Interventions Self-care/ADL training;Ultrasound;Therapeutic exercise;Neuromuscular education;Manual Therapy;Functional Mobility Training;DME and/or AE instruction;Splinting;Therapeutic exercises;Therapeutic activities;Balance training;Patient/family education   Plan Patient to bring in guitar, NMR LUE   OT Home Exercise Plan Initiated 08/21/15, reviewed 08/22/15   Consulted and Agree with Plan of Care Patient;Family member/caregiver   Family Member Consulted wife [name]      Patient will benefit from skilled therapeutic intervention in order to improve the following deficits and impairments:  Decreased activity  tolerance, Decreased balance, Decreased cognition, Decreased coordination, Decreased range of motion, Decreased mobility, Decreased strength, Difficulty walking, Impaired perceived functional ability, Improper body mechanics, Impaired UE functional use, Impaired tone, Impaired sensation  Visit Diagnosis: Sensation alteration, late effect of cerebrovascular disease - Plan: Ot plan of care cert/re-cert  Hemiplegia and hemiparesis following cerebral infarction affecting left non-dominant side ([place]) - Plan: Ot plan of care cert/re-cert  Unsteadiness on feet - Plan: Ot plan of care cert/re-cert  Other lack of coordination - Plan: Ot plan of care cert/re-cert      G-Codes - [id number] 23-Sep-2009    Functional Assessment Tool Used 9 HOLE PEG TEST, skilled clinical observation   Functional Limitation Carrying, moving and handling objects   Carrying,  Moving and Handling Objects Current Status [phone]) At least 40 percent but less than 60 percent impaired, limited or restricted   Carrying, Moving and Handling Objects Goal Status [id number]) At least 20 percent but less than 40 percent impaired, limited or restricted      Problem List Patient Active Problem List   Diagnosis Date Noted  . Snoring 07/03/2015  . Persistent atrial fibrillation ([place]) 04/16/2015  . Hypokalemia 09/20/2014  . HTN (hypertension) 09/19/2014  . HLD (hyperlipidemia) 09/19/2014  . Diabetes mellitus without complication ([place]) [id number]  . Jerking 09/19/2014  . Alterations of sensations, late effect of cerebrovascular disease 11/07/2013  . Disturbances of vision, late effect of cerebrovascular disease 11/07/2013  . Sinus bradycardia 08/08/2013  . 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[place]) 07/26/2013  . Left-sided neglect 07/26/2013  . Small vessel disease ([place]) 06/07/2013  . Obesity, unspecified 06/07/2013  . CVA (cerebral infarction) 06/07/2013  . large right middle cerebral artery infarct, embolic [id number]  .  Hypertension 06/03/2013  . Diabetes ([place]) 06/03/2013    Mariah Milling, OTR/L 09/06/2015, 8:16 PM  [facility] [address] [place], [place], [address] Phone: [phone]   Fax:  [phone]  Name: Leor Dinse MRN: [id number] Date of Birth: 03/29/1946

## 2015-09-06 NOTE — Addendum Note (Signed)
Addended by: Billie Ruddy A on: 09/06/2015 09:40 PM   Modules accepted: Orders

## 2015-09-06 NOTE — Therapy (Signed)
[facility] [address] [place], [place], [address] Phone: [phone]   Fax:  [phone]  Physical Therapy Treatment  Patient Details  Name: Donald [name] MRN: [id number] Date of Birth: [birth year] Referring Provider: Ernestine Mcmurray, NP  Encounter Date: 09/06/2015    09/06/15 1450  PT Visits / Re-Eval  Visit Number 7  Number of Visits 13  Date for PT Re-Evaluation 09/22/15  Authorization  Authorization Type Medicare primary; [employer] secondary - G Codes and PN required every 10 visits  PT Time Calculation  PT Start Time 1447  PT Stop Time 1530  PT Time Calculation (min) 43 min  PT - End of Session  Activity Tolerance Patient tolerated treatment well  Behavior During Therapy [facility] for tasks assessed/performed    Past Medical History  Diagnosis Date  . Hypertension   . Diabetes mellitus without complication ([place])   . Hyperlipemia   . large right middle cerebral artery infarct, embolic 10/03/2990    a. s/p IV tPA, b. source unknown, c. loop recorder placed  . Snoring 07/03/2015    Past Surgical History  Procedure Laterality Date  . Tee without cardioversion N/A 06/07/2013    Procedure: TRANSESOPHAGEAL ECHOCARDIOGRAM (TEE);  Surgeon: [name], MD;  Location: [facility] ENDOSCOPY;  Service: Cardiovascular;  Laterality: N/A;  . Loop recorder implant  06/07/2013    MDT LinQ implanted by Dr [name] for cryptogenic stroke  . Loop recorder implant N/A 06/07/2013    Procedure: LOOP RECORDER IMPLANT;  Surgeon: [name], MD;  Location: [place] CATH LAB;  Service: Cardiovascular;  Laterality: N/A;    There were no vitals filed for this visit.  Visit Diagnosis:   ICD-9-CM ICD-10-CM PL 1.  Spastic hemiplegia affecting left nondominant side (HCC)   342.12 G81.14 Change Dx 2.  Other abnormalities of gait and mobility   781.2 R26.89 Change Dx 3.  Unsteadiness on feet   781.2 R26.81 Change Dx 4.  Unspecified lack  of coordination   781.3 R27.9 Change Dx 5.  Other disturbances of skin sensation        Subjective Assessment - 09/06/15 1449    Subjective Has had a GI "bug" for the past few days, better today.  Denies any pain or falls. HEP is going okay, has not done in past 3 days due to illness, however before that was going good.   Patient is accompained by: --  wife, Donald [name]   Pertinent History Pt likes to be called "[name]".   PMH significant for: CVA (large R MCA infarct, embolic),  seizures, HTN, HLD, DM, persistent A-Fib   Patient Stated Goals Work on walking downstairs, work on walking technique to make it better, and to work on my reaction time when I lose my balance to my left side.   Currently in Pain? No/denies            [facility] PT Assessment - 09/06/15 1454    Functional Gait  Assessment   Gait assessed  Yes   Gait Level Surface Walks 20 ft in less than 7 sec but greater than 5.5 sec, uses assistive device, slower speed, mild gait deviations, or deviates 6-10 in outside of the 12 in walkway width.   Change in Gait Speed Able to change speed, demonstrates mild gait deviations, deviates 6-10 in outside of the 12 in walkway width, or no gait deviations, unable to achieve a major change in velocity, or uses a change in velocity, or uses an assistive  device.   Gait with Horizontal Head Turns Performs head turns smoothly with slight change in gait velocity (eg, minor disruption to smooth gait path), deviates 6-10 in outside 12 in walkway width, or uses an assistive device.   Gait with Vertical Head Turns Performs task with slight change in gait velocity (eg, minor disruption to smooth gait path), deviates 6 - 10 in outside 12 in walkway width or uses assistive device   Gait and Pivot Turn Pivot turns safely in greater than 3 sec and stops with no loss of balance, or pivot turns safely within 3 sec and stops with mild imbalance, requires small steps to catch balance.   Step Over Obstacle Is able  to step over one shoe box (4.5 in total height) but must slow down and adjust steps to clear box safely. May require verbal cueing.   Gait with Narrow Base of Support Ambulates less than 4 steps heel to toe or cannot perform without assistance.   Gait with Eyes Closed Walks 20 ft, slow speed, abnormal gait pattern, evidence for imbalance, deviates 10-15 in outside 12 in walkway width. Requires more than 9 sec to ambulate 20 ft.   Ambulating Backwards Walks 20 ft, slow speed, abnormal gait pattern, evidence for imbalance, deviates 10-15 in outside 12 in walkway width.   Steps Two feet to a stair, must use rail.   Total Score 14     Neuro re-ed: Quadruped: over pball for support Alternating leg outs x 10 each side Contralateral combo UE /leg raises x 5 each side  Tall kneeling Partial sit backs x 10 reps   standing at counter Right foot propped on bottom cabinet with reaching up and lateral to left side with left UE  to place post it notes as high on wall as possible x 8 reps. Cues on weight shifting, pelvic positioning and posture.           PT Short Term Goals - 09/06/15 1507    PT SHORT TERM GOAL #1   Title Pt will perform initial HEP with mod I using paper handout to indicate safe daily compliance with home program.   (Target date: 08/29/15)  Correction: Target date 08/29/15   Baseline Met 3/29.   Status Achieved   PT SHORT TERM GOAL #2   Title Complete FGA and improve score by 4 points to indicate improved dynamic gait stability.   (Target date: 08/29/15)   Baseline 08/15/15: baseline 11/30 today. 09/06/15: scored 14/30 today, 3 point improvement   Status Not Met   PT SHORT TERM GOAL #3   Title Pt will improve gait velocity from 1.79 ft/sec to > / = 2.09 ft/sec to indicate decreased risk of recurrent falls.   (Target date: 08/29/15)   Baseline 3/29: gait velocity = 2.33 ft/sec   Status Achieved   PT SHORT TERM GOAL #4   Title Pt will negotiate 2 stairs without rails with mod I  using LRAD to indicate increased independence using primary home entrance.  (Target date: 08/29/15)   Baseline Met 3/29 using SPC.   Status Achieved   PT SHORT TERM GOAL #5   Title Pt will ambulate > 300' over level, indoor surfaces without AFO with no evidence of L ankle instability (excessive forefoot supination) to indicate increased safety with ambulation.   (Target date: 08/29/15)   Baseline Met 3/27.   Status Achieved   PT SHORT TERM GOAL #6   Title Pt will ambulate 50' over grass surfaces with supervision  using LRAD to increase pt safety attending grandson's soccer games.   (Target date: 08/29/15)   Baseline Met 3/27.   Status Achieved   PT SHORT TERM GOAL #7   Title Pt will perform floor transfer (supine on floor > standing) with min A without use of external aid to indicate increased safety with fall recovery.   (Target date: 08/29/15)   Baseline 3/29: attempted for the first time with use of external aid (mat table); however, pt performed final trial with (S).   Status Partially Met           PT Long Term Goals - 08/08/15 2010    PT LONG TERM GOAL #1   Title Pt will improve gait velocity from 1.79 ft/sec to > / = 2.39 ft/sec to indicate increased efficiency of ambulation.   (Target date: 09/19/15)   PT LONG TERM GOAL #2   Title Pt will score > / = 19/30 on FGA to indicate decreased fall risk.   (Target date: 09/19/15)   PT LONG TERM GOAL #3   Title Pt will ambulate > 500' over unlevel, paved surfaces without AFO with mod I using LRAD with no L ankle instability to indicate safety with community mobility.  (Target date: 09/19/15)   PT LONG TERM GOAL #4   Title Pt will ambulate 33' over unlevel, grass surfaces with mod I using LRAD to indicate increased independence attending grandson's soccer games.    (Target date: 09/19/15)   PT LONG TERM GOAL #5   Title Pt will perform floor transfer (supine on floor > standing) with supervision without use of external aid to indicate increased  safety with fall recovery.   (Target date: 09/19/15)   Additional Long Term Goals   Additional Long Term Goals Yes   PT LONG TERM GOAL #6   Title Pt will negotiate 4 stairs without rails with mod I using LRAD to indicate increased access to community.   (Target date: 09/19/15)        09/06/15 1451  Plan  Clinical Impression Statement Pt has made progress toward the FGA goal, however did not meet the STG today. Continued to work on coordination, balance and strengthening activities with emphasis on left side weight shifting/weight beariing. Pt is making slow, steady progress toward goals.   Pt will benefit from skilled therapeutic intervention in order to improve on the following deficits Abnormal gait;Decreased balance;Decreased coordination;Increased edema;Impaired flexibility;Impaired sensation;Impaired tone;Decreased strength  Rehab Potential Good  Clinical Impairments Affecting Rehab Potential Time since CVA (> 2 years)  PT Frequency 2x / week  PT Duration 6 weeks  PT Treatment/Interventions ADLs/Self Care Home Management;DME Instruction;Gait training;Stair training;Functional mobility training;Therapeutic activities;Therapeutic exercise;Balance training;Orthotic Fit/Training;Patient/family education;Neuromuscular re-education;Vestibular  PT Next Visit Plan Continue NMR with emphasis on  L trunk elongation, L lateral weight shift, L gluteus medius/max activation in L stance.   Consulted and Agree with Plan of Care Patient;Family member/caregiver  Family Member Consulted wife, Donald [name]    Problem List Patient Active Problem List   Diagnosis Date Noted  . Snoring 07/03/2015  . Persistent atrial fibrillation ([place]) 04/16/2015  . Hypokalemia 09/20/2014  . HTN (hypertension) 09/19/2014  . HLD (hyperlipidemia) 09/19/2014  . Diabetes mellitus without complication ([place]) 35/36/1443  . Jerking 09/19/2014  . Alterations of sensations, late effect of cerebrovascular disease 11/07/2013  .  Disturbances of vision, late effect of cerebrovascular disease 11/07/2013  . Sinus bradycardia 08/08/2013  . 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[place]) 07/26/2013  . Left-sided neglect 07/26/2013  .  Small vessel disease ([place]) 06/07/2013  . Obesity, unspecified 06/07/2013  . CVA (cerebral infarction) 06/07/2013  . large right middle cerebral artery infarct, embolic 09/38/1829  . Hypertension 06/03/2013  . Diabetes ([place]) 06/03/2013    Willow Ora, PTA, [facility] [address] [place], [place] [address] [phone] 09/09/2015, 10:06 PM  Name: Donald [name] MRN: [id number] Date of Birth: [birth year]

## 2015-09-10 ENCOUNTER — Ambulatory Visit: Payer: Medicare Other | Admitting: Physical Therapy

## 2015-09-10 ENCOUNTER — Ambulatory Visit: Payer: Medicare Other | Admitting: Occupational Therapy

## 2015-09-10 DIAGNOSIS — G8114 Spastic hemiplegia affecting left nondominant side: Secondary | ICD-10-CM

## 2015-09-10 DIAGNOSIS — R279 Unspecified lack of coordination: Secondary | ICD-10-CM | POA: Diagnosis not present

## 2015-09-10 DIAGNOSIS — R2681 Unsteadiness on feet: Secondary | ICD-10-CM | POA: Diagnosis not present

## 2015-09-10 DIAGNOSIS — R278 Other lack of coordination: Secondary | ICD-10-CM | POA: Diagnosis not present

## 2015-09-10 DIAGNOSIS — R208 Other disturbances of skin sensation: Secondary | ICD-10-CM

## 2015-09-10 DIAGNOSIS — R2689 Other abnormalities of gait and mobility: Secondary | ICD-10-CM

## 2015-09-10 DIAGNOSIS — I69354 Hemiplegia and hemiparesis following cerebral infarction affecting left non-dominant side: Secondary | ICD-10-CM

## 2015-09-10 NOTE — Patient Instructions (Signed)
Pelvic Rotation: Knee-to-Chest (Supine)    With right leg hanging over side of bench, other knee to chest, relax leg as much as possible. Hold _60___ seconds. Relax. Perform this stretch 3 times per day on the RIGHT leg.

## 2015-09-11 ENCOUNTER — Encounter: Payer: Self-pay | Admitting: Occupational Therapy

## 2015-09-11 NOTE — Therapy (Signed)
[facility] [address] [place], [place], [address] Phone: [phone]   Fax:  [phone]  Physical Therapy Treatment  Patient Details  Name: Donald [name] MRN: [id number] Date of Birth: 1945-12-07 Referring Provider: Ernestine Mcmurray, NP  Encounter Date: 09/10/2015      PT End of Session - 09/11/15 0512    Visit Number 8   Number of Visits 13   Date for PT Re-Evaluation 09/22/15   Authorization Type Medicare primary; [employer] secondary - G Codes and PN required every 10 visits   PT Start Time 1536   PT Stop Time 1626   PT Time Calculation (min) 50 min   Activity Tolerance Patient tolerated treatment well   Behavior During Therapy [facility] for tasks assessed/performed      Past Medical History  Diagnosis Date  . Hypertension   . Diabetes mellitus without complication ([place])   . Hyperlipemia   . large right middle cerebral artery infarct, embolic 10/03/9824    a. s/p IV tPA, b. source unknown, c. loop recorder placed  . Snoring 07/03/2015    Past Surgical History  Procedure Laterality Date  . Tee without cardioversion N/A 06/07/2013    Procedure: TRANSESOPHAGEAL ECHOCARDIOGRAM (TEE);  Surgeon: [name], MD;  Location: [facility] ENDOSCOPY;  Service: Cardiovascular;  Laterality: N/A;  . Loop recorder implant  06/07/2013    MDT LinQ implanted by Dr [name] for cryptogenic stroke  . Loop recorder implant N/A 06/07/2013    Procedure: LOOP RECORDER IMPLANT;  Surgeon: [name], MD;  Location: [place] [facility] CATH LAB;  Service: Cardiovascular;  Laterality: N/A;    There were no vitals filed for this visit.      Subjective Assessment - 09/10/15 1537    Subjective Pt feels "back to normal" after having GI bug. Pt denies falls. Thinks walking is progressing. Pt inquiring as to whether it would be realistic for him to mow his "very flat yard" at home using a push mower. Explains that the mower will shut off, as a safety  precation, if pt stops pushing it.    Patient is accompained by: Family member  wife present for final 20 minutes of session   Pertinent History Pt likes to be called "Donald [name]".   PMH significant for: CVA (large R MCA infarct, embolic),  seizures, HTN, HLD, DM, persistent A-Fib   Patient Stated Goals Work on walking downstairs, work on walking technique to make it better, and to work on my reaction time when I lose my balance to my left side.   Currently in Pain? No/denies                         [facility] Adult PT Treatment/Exercise - 09/11/15 0001    Ambulation/Gait   Ambulation/Gait Yes   Ambulation/Gait Assistance 5: Supervision;6: Modified independent (Device/Increase time);4: Min guard   Ambulation/Gait Assistance Details Mod I for gait over level, indoor surfaces with SPC without LLE orthosis. Gait x125' over unlevel, grass surfaces with L ASO, which maintained L ankle stability for ~75% of gait trial. Cueing required for attention to excessive L forefoot supination during remainder of gait trial. Gait x420 over level, indoor surfaces without AD with cueing for increased selective control of L hip flexion with L knee flexion and L knee extension with L ankle DF. Pt able to selectively control L hip/knee but unable to concurrently control L ankle DF/eversion.    Ambulation Distance (Feet) 545 Feet  Assistive device None;Other (Comment)  and L ASO for gait over grass   Gait Pattern Step-through pattern;Decreased arm swing - left;Decreased dorsiflexion - left;Decreased hip/knee flexion - left;Left circumduction  dec M/L stability of L ankle on grass   Ambulation Surface Level;Unlevel;Indoor;Outdoor;Paved;Grass   Stairs Yes   Stairs Assistance 4: Min assist;6: Modified independent (Device/Increase time);5: Supervision   Stairs Assistance Details (indicate cue type and reason) Per pt request to negotiate 12 stairs (2 rails but unsure if able to reach both) at home, negotiated  initial 4 stairs forward-facing with B rails with mod I; subsequent 4 stairs laterally with BUE support on L rail wiht (S); and final 4 stairs laterally with BUE support on R rail, requiring min A to ascend (due to difficulty leading with LLE) and supervision to descend.   Stair Management Technique One rail Right;One rail Left;Step to pattern;Sideways;Forwards   Number of Stairs 12  4 stairs x3 trials   Height of Stairs 6   Self-Care   Self-Care Other Self-Care Comments   Other Self-Care Comments  Recommending pt negotiate 12 stairs at home (on back deck, per pt) forward-facing or laterally with BUE support on L rail with RLE leading, as pt did require min A to ascend with LLE leading. Pt/wife verbalized understanding. In reference to pt-expressed desire to [facility] lawn, recommending that pt consider either 1) orthotic provide M/L stability at L ankle (UCBL) or 2) consider wearing former L AFO (Reaction) specifically for ambulating over grass. Pt verbalized understanding and is open to idea of UCBL. This PT also proprosed the idea of a riding lawnmower.   Exercises   Exercises Other Exercises   Other Exercises  With verbal/demo cueing from PT, pt performed self-stretch of L iliopsoas and L rectus femoris 2 x1-minute holds each. Added to HEP.                PT Education - 09/11/15 0457    Education provided Yes   Education Details HEP: added L hip flexor stretch for management of extensor tone.   Person(s) Educated Patient;Spouse   Methods Explanation;Demonstration;Handout;Verbal cues   Comprehension Verbalized understanding;Returned demonstration          PT Short Term Goals - 09/06/15 1507    PT SHORT TERM GOAL #1   Title Pt will perform initial HEP with mod I using paper handout to indicate safe daily compliance with home program.   (Target date: 08/29/15)  Correction: Target date 08/29/15   Baseline Met 3/29.   Status Achieved   PT SHORT TERM GOAL #2   Title Complete FGA and  improve score by 4 points to indicate improved dynamic gait stability.   (Target date: 08/29/15)   Baseline 08/15/15: baseline 11/30 today. 09/06/15: scored 14/30 today, 3 point improvement   Status Not Met   PT SHORT TERM GOAL #3   Title Pt will improve gait velocity from 1.79 ft/sec to > / = 2.09 ft/sec to indicate decreased risk of recurrent falls.   (Target date: 08/29/15)   Baseline 3/29: gait velocity = 2.33 ft/sec   Status Achieved   PT SHORT TERM GOAL #4   Title Pt will negotiate 2 stairs without rails with mod I using LRAD to indicate increased independence using primary home entrance.  (Target date: 08/29/15)   Baseline Met 3/29 using SPC.   Status Achieved   PT SHORT TERM GOAL #5   Title Pt will ambulate > 300' over level, indoor surfaces without AFO with no evidence of  L ankle instability (excessive forefoot supination) to indicate increased safety with ambulation.   (Target date: 08/29/15)   Baseline Met 3/27.   Status Achieved   PT SHORT TERM GOAL #6   Title Pt will ambulate 50' over grass surfaces with supervision using LRAD to increase pt safety attending grandson's soccer games.   (Target date: 08/29/15)   Baseline Met 3/27.   Status Achieved   PT SHORT TERM GOAL #7   Title Pt will perform floor transfer (supine on floor > standing) with min A without use of external aid to indicate increased safety with fall recovery.   (Target date: 08/29/15)   Baseline 3/29: attempted for the first time with use of external aid (mat table); however, pt performed final trial with (S).   Status Partially Met           PT Long Term Goals - 08/08/15 2010    PT LONG TERM GOAL #1   Title Pt will improve gait velocity from 1.79 ft/sec to > / = 2.39 ft/sec to indicate increased efficiency of ambulation.   (Target date: 09/19/15)   PT LONG TERM GOAL #2   Title Pt will score > / = 19/30 on FGA to indicate decreased fall risk.   (Target date: 09/19/15)   PT LONG TERM GOAL #3   Title Pt will ambulate  > 500' over unlevel, paved surfaces without AFO with mod I using LRAD with no L ankle instability to indicate safety with community mobility.  (Target date: 09/19/15)   PT LONG TERM GOAL #4   Title Pt will ambulate 46' over unlevel, grass surfaces with mod I using LRAD to indicate increased independence attending grandson's soccer games.    (Target date: 09/19/15)   PT LONG TERM GOAL #5   Title Pt will perform floor transfer (supine on floor > standing) with supervision without use of external aid to indicate increased safety with fall recovery.   (Target date: 09/19/15)   Additional Long Term Goals   Additional Long Term Goals Yes   PT LONG TERM GOAL #6   Title Pt will negotiate 4 stairs without rails with mod I using LRAD to indicate increased access to community.   (Target date: 09/19/15)               Plan - 09/11/15 0515    Clinical Impression Statement Session focused on assessing pt safety ambulating over grass, as pt expresing desire to be more stable with this. Continue to note M/L instability at L ankle when ambulating on grass, even when wearing L ASO. Feel as though L ankle is stable for ambulation over level, indoor surfaces, but may need orthotic (less restrictive than current Reaction AFO) to safely ambulate on grass. Pt open to this idea.    Clinical Impairments Affecting Rehab Potential Time since CVA (> 2 years)   PT Frequency 2x / week   PT Duration 6 weeks   PT Treatment/Interventions ADLs/Self Care Home Management;DME Instruction;Gait training;Stair training;Functional mobility training;Therapeutic activities;Therapeutic exercise;Balance training;Orthotic Fit/Training;Patient/family education;Neuromuscular re-education;Vestibular   PT Next Visit Plan Video gait pattern and send to orthotist (have pt sign HPI form, if needed) to assess if pt appropriate candidate for UCBL brace.   Consulted and Agree with Plan of Care Patient;Family member/caregiver   Family Member  Consulted wife, [name]      Patient will benefit from skilled therapeutic intervention in order to improve the following deficits and impairments:  Abnormal gait, Decreased balance, Decreased coordination, Increased edema,  Impaired flexibility, Impaired sensation, Impaired tone, Decreased strength  Visit Diagnosis: Spastic hemiplegia affecting left nondominant side (HCC)  Other abnormalities of gait and mobility  Unspecified lack of coordination     Problem List Patient Active Problem List   Diagnosis Date Noted  . Snoring 07/03/2015  . Persistent atrial fibrillation ([place]) 04/16/2015  . Hypokalemia 09/20/2014  . HTN (hypertension) 09/19/2014  . HLD (hyperlipidemia) 09/19/2014  . Diabetes mellitus without complication ([place]) 17/00/1749  . Jerking 09/19/2014  . Alterations of sensations, late effect of cerebrovascular disease 11/07/2013  . Disturbances of vision, late effect of cerebrovascular disease 11/07/2013  . Sinus bradycardia 08/08/2013  . 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[place]) 07/26/2013  . Left-sided neglect 07/26/2013  . Small vessel disease ([place]) 06/07/2013  . Obesity, unspecified 06/07/2013  . CVA (cerebral infarction) 06/07/2013  . large right middle cerebral artery infarct, embolic 44/96/7591  . Hypertension 06/03/2013  . Diabetes ([place]) 06/03/2013    Billie Ruddy, PT, DPT [facility] [address] [place], [place], [address] Phone: [phone]   Fax:  [phone] 09/11/2015, 5:19 AM  Name: Henson Fraticelli MRN: [id number] Date of Birth: Nov 20, 1945

## 2015-09-11 NOTE — Therapy (Signed)
[facility] [address] [place], [place], [address] Phone: [phone]   Fax:  [phone]  Occupational Therapy Treatment  Patient Details  Name: Donald [name] MRN: [id number] Date of Birth: 03/29/1946 Referring Provider: Dr. Margette Fast  Encounter Date: 09/10/2015      OT End of Session - 09/11/15 1253    Visit Number 7   Number of Visits 17   Date for OT Re-Evaluation 10/03/15   Authorization Type Medicare   Authorization Time Period Progress note / gcode 10th visit   Authorization - Visit Number 7   Authorization - Number of Visits 10   OT Start Time 1450   OT Stop Time 1530   OT Time Calculation (min) 40 min   Equipment Utilized During Treatment guitar   Activity Tolerance Patient tolerated treatment well   Behavior During Therapy [facility] for tasks assessed/performed      Past Medical History  Diagnosis Date  . Hypertension   . Diabetes mellitus without complication ([place])   . Hyperlipemia   . large right middle cerebral artery infarct, embolic [id number]    a. s/p IV tPA, b. source unknown, c. loop recorder placed  . Snoring 07/03/2015    Past Surgical History  Procedure Laterality Date  . Tee without cardioversion N/A 06/07/2013    Procedure: TRANSESOPHAGEAL ECHOCARDIOGRAM (TEE);  Surgeon: [name], MD;  Location: [facility] ENDOSCOPY;  Service: Cardiovascular;  Laterality: N/A;  . Loop recorder implant  06/07/2013    MDT LinQ implanted by Dr [name] for cryptogenic stroke  . Loop recorder implant N/A 06/07/2013    Procedure: LOOP RECORDER IMPLANT;  Surgeon: [name], MD;  Location: [facility] CATH LAB;  Service: Cardiovascular;  Laterality: N/A;    There were no vitals filed for this visit.      Subjective Assessment - 09/11/15 1237    Subjective  I would like to think  could drive or hold a part time job   Pertinent History Afib, seizure 09/2014,    Patient Stated Goals Play guitar    Currently  in Pain? No/denies   Multiple Pain Sites No                      OT Treatments/Exercises (OP) - 09/11/15 0001    ADLs   Leisure Patient has goal to return to playing guitar.  Patient limited by decreased coordination, ad sensation in left hand.  Patient brought guitar to this session,and wasable to play 2 and 3 string chords with minimal intervention.  Problem solved with patient for accuracy with fingertip placement, positioning, and pressure for left handed chording.  Patient reports very sensitive fingertip - burning sensation during this task.  Attempted use of this fabric glove to aide with desensitization, not effective for accuracy.  Patient thinks a lighter string may be less abrasive to fingertips,a dn he will plan to restring guitar.  Patient may alos try fingertip protectors (rubber) to desensitize fingertips.  Patient with most difficulty with coordination of fourth digit, and strength in fifth digit during this exercise today.     ADL Comments Patient eager to discuss options to reduce support and increase independence at home and in the commnity.  Patient pleased with overall progress.                  OT Education - 09/11/15 1252    Education provided Yes   Education Details Guitar chording strategies   Person(s) Educated Patient  Methods Explanation;Demonstration;Verbal cues   Comprehension Verbalized understanding          OT Short Term Goals - 09/11/15 1254    OT SHORT TERM GOAL #1   Title Patient will complete home exercise program independently due 09/13/15   Status On-going   OT SHORT TERM GOAL #2   Title Patient will demonstrate improved coordination in left hand as evidenced by decrease time on 9 hole peg test by 10 seconds   Status Achieved   OT SHORT TERM GOAL #3   Title Patient will demonstrate ability to support neck of guitar with left hand while seated, and demonstrate adequate finger control to form 1-2 cord positions with increased  time   Status Achieved   OT SHORT TERM GOAL #4   Title Patient will adequately use bilateral hands to slice a soft fruit or vegetable (tomatao) without incident with minimal environmental set up and cueing   Status On-going   OT SHORT TERM GOAL #5   Title Patient will demonstrate adequate strength and control of left UE to lift an object weighing up to 3 lb weight into overhead shelf    Status On-going           OT Long Term Goals - 09/06/15 1509    OT LONG TERM GOAL #1   Title Patient will complete update home exercise / home activity program independently (due 10/03/15)   Status On-going   OT LONG TERM GOAL #2   Title Patient will demonstrate improved fine motor coordination in left UE as evidenced by 20 second reduction in time on 9 hole peg test   OT LONG TERM GOAL #3   Title Patient will adjust left hand position on neck of guitar for 3-4 chord changes in time with simple song.   Status On-going   OT LONG TERM GOAL #4   Title Patient will unload dishwasher lifting 2-3 plates at once (or comparable weight)  into overhead shelf    Status On-going   OT LONG TERM GOAL #5   Title Patient will carry beverage in left hand  while walking in community at least 150 feet,using cane in right hand in minimally crowded / congested environment    Status On-going               Plan - 09/11/15 1253    Clinical Impression Statement Patient showig progress toward all short term goals.     Rehab Potential Good   OT Frequency 2x / week   OT Duration 8 weeks   OT Treatment/Interventions Self-care/ADL training;Ultrasound;Therapeutic exercise;Neuromuscular education;Manual Therapy;Functional Mobility Training;DME and/or AE instruction;Splinting;Therapeutic exercises;Therapeutic activities;Balance training;Patient/family education   Plan NMR LUE, discussion regarding withdrawal of support in home and community   Consulted and Agree with Plan of Care Patient;Family member/caregiver       Patient will benefit from skilled therapeutic intervention in order to improve the following deficits and impairments:  Decreased activity tolerance, Decreased balance, Decreased cognition, Decreased coordination, Decreased range of motion, Decreased mobility, Decreased strength, Difficulty walking, Impaired perceived functional ability, Improper body mechanics, Impaired UE functional use, Impaired tone, Impaired sensation  Visit Diagnosis: Hemiplegia and hemiparesis following cerebral infarction affecting left non-dominant side (HCC)  Other disturbances of skin sensation    Problem List Patient Active Problem List   Diagnosis Date Noted  . Snoring 07/03/2015  . Persistent atrial fibrillation ([place]) 04/16/2015  . Hypokalemia 09/20/2014  . HTN (hypertension) 09/19/2014  . HLD (hyperlipidemia) 09/19/2014  . Diabetes mellitus without complication (  [place]) 09/19/2014  . Jerking 09/19/2014  . Alterations of sensations, late effect of cerebrovascular disease 11/07/2013  . Disturbances of vision, late effect of cerebrovascular disease 11/07/2013  . Sinus bradycardia 08/08/2013  . 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[place]) 07/26/2013  . Left-sided neglect 07/26/2013  . Small vessel disease ([place]) 06/07/2013  . Obesity, unspecified 06/07/2013  . CVA (cerebral infarction) 06/07/2013  . large right middle cerebral artery infarct, embolic [id number]  . Hypertension 06/03/2013  . Diabetes ([place]) 06/03/2013    Mariah Milling, OTR/L 09/11/2015, 1:00 PM  [facility] [address] [place], [place], [address] Phone: [phone]   Fax:  [phone]  Name: Donald [name] MRN: [id number] Date of Birth: 1946/01/24

## 2015-09-12 ENCOUNTER — Ambulatory Visit: Payer: Medicare Other | Admitting: Physical Therapy

## 2015-09-12 ENCOUNTER — Ambulatory Visit: Payer: Medicare Other | Admitting: Occupational Therapy

## 2015-09-12 ENCOUNTER — Telehealth: Payer: Self-pay | Admitting: Physical Therapy

## 2015-09-12 DIAGNOSIS — R279 Unspecified lack of coordination: Secondary | ICD-10-CM

## 2015-09-12 DIAGNOSIS — R208 Other disturbances of skin sensation: Secondary | ICD-10-CM | POA: Diagnosis not present

## 2015-09-12 DIAGNOSIS — R2681 Unsteadiness on feet: Secondary | ICD-10-CM | POA: Diagnosis not present

## 2015-09-12 DIAGNOSIS — R2689 Other abnormalities of gait and mobility: Secondary | ICD-10-CM

## 2015-09-12 DIAGNOSIS — G8114 Spastic hemiplegia affecting left nondominant side: Secondary | ICD-10-CM | POA: Diagnosis not present

## 2015-09-12 DIAGNOSIS — R278 Other lack of coordination: Secondary | ICD-10-CM | POA: Diagnosis not present

## 2015-09-12 NOTE — Therapy (Signed)
[facility] [address] [place], [place], [address] Phone: [phone]   Fax:  [phone]  Physical Therapy Treatment  Patient Details  Name: Donald [name] MRN: [id number] Date of Birth: [birth year] Referring Provider: Ernestine Mcmurray, NP  Encounter Date: 09/12/2015      PT End of Session - 09/12/15 2300    Visit Number 9   Number of Visits 13   Date for PT Re-Evaluation 09/22/15   Authorization Type Medicare primary; [employer] secondary - G Codes and PN required every 10 visits   PT Start Time 1538   PT Stop Time 1632   PT Time Calculation (min) 54 min   Activity Tolerance Patient tolerated treatment well   Behavior During Therapy [facility] for tasks assessed/performed      Past Medical History  Diagnosis Date  . Hypertension   . Diabetes mellitus without complication ([place])   . Hyperlipemia   . large right middle cerebral artery infarct, embolic 01/06/5642    a. s/p IV tPA, b. source unknown, c. loop recorder placed  . Snoring 07/03/2015    Past Surgical History  Procedure Laterality Date  . Tee without cardioversion N/A 06/07/2013    Procedure: TRANSESOPHAGEAL ECHOCARDIOGRAM (TEE);  Surgeon: [name], MD;  Location: [facility] ENDOSCOPY;  Service: Cardiovascular;  Laterality: N/A;  . Loop recorder implant  06/07/2013    MDT LinQ implanted by Dr [name] for cryptogenic stroke  . Loop recorder implant N/A 06/07/2013    Procedure: LOOP RECORDER IMPLANT;  Surgeon: [name], MD;  Location: [facility] CATH LAB;  Service: Cardiovascular;  Laterality: N/A;    There were no vitals filed for this visit.      Subjective Assessment - 09/12/15 1543    Subjective Pt accompanied by wife today. Initial 5 minutes of session focused on discussing pt goals. More specifically, pt wishes to negotiate stairs without rails with no hands-on help from wife.    Patient is accompained by: Family member  wife, Donald [name]   Pertinent  History Pt likes to be called "Donald [name]".   PMH significant for: CVA (large R MCA infarct, embolic),  seizures, HTN, HLD, DM, persistent A-Fib   Patient Stated Goals Work on walking downstairs, work on walking technique to make it better, and to work on my reaction time when I lose my balance to my left side.   Currently in Pain? No/denies                         [facility] Adult PT Treatment/Exercise - 09/12/15 0001    Ambulation/Gait   Ambulation/Gait Yes   Ambulation/Gait Assistance 5: Supervision   Ambulation/Gait Assistance Details Pt ambulated x750' consecutively over unlevel paved and grass surfaces using L PLS (WalkOn) AFO for DF assist. Also noted PLS improved L knee flexion during LLE advancement; improved M/L ankle stability (both over level surfaces and over grass); improved lateral weight shift to L side; and decreased LUE hypertonicity.   Ambulation Distance (Feet) 750 Feet  then x230' additional feet following seated rest break   Assistive device None;Straight cane;Other (Comment)  L PLS AFO   Gait Pattern Step-through pattern;Decreased arm swing - left;Decreased hip/knee flexion - left;Decreased weight shift to left    Ambulation Surface Level;Unlevel;Indoor;Outdoor;Paved;Grass   Stairs Yes   Stairs Assistance 5: Supervision   Stairs Assistance Details (indicate cue type and reason) Mod I for ascent; during descent, PT provided cueing for pt to place L heel  against riser of step to ensure foot safely placed on step. Pt gave effective return demo during subsequent 2 trials.   Stair Management Technique No rails;Forwards;With cane   Number of Stairs 12   Height of Stairs 6                PT Education - 09/12/15 1701    Education provided Yes   Education Details Recommendation for L PLS AFO to improve weight shifting, decreased LUE tone, and improve L ankle stability (especially when ambulating over grass).    Person(s) Educated Patient;Spouse   Methods  Explanation;Demonstration;Handout   Comprehension Verbalized understanding;Returned demonstration          PT Short Term Goals - 09/06/15 1507    PT SHORT TERM GOAL #1   Title Pt will perform initial HEP with mod I using paper handout to indicate safe daily compliance with home program.   (Target date: 08/29/15)  Correction: Target date 08/29/15   Baseline Met 3/29.   Status Achieved   PT SHORT TERM GOAL #2   Title Complete FGA and improve score by 4 points to indicate improved dynamic gait stability.   (Target date: 08/29/15)   Baseline 08/15/15: baseline 11/30 today. 09/06/15: scored 14/30 today, 3 point improvement   Status Not Met   PT SHORT TERM GOAL #3   Title Pt will improve gait velocity from 1.79 ft/sec to > / = 2.09 ft/sec to indicate decreased risk of recurrent falls.   (Target date: 08/29/15)   Baseline 3/29: gait velocity = 2.33 ft/sec   Status Achieved   PT SHORT TERM GOAL #4   Title Pt will negotiate 2 stairs without rails with mod I using LRAD to indicate increased independence using primary home entrance.  (Target date: 08/29/15)   Baseline Met 3/29 using SPC.   Status Achieved   PT SHORT TERM GOAL #5   Title Pt will ambulate > 300' over level, indoor surfaces without AFO with no evidence of L ankle instability (excessive forefoot supination) to indicate increased safety with ambulation.   (Target date: 08/29/15)   Baseline Met 3/27.   Status Achieved   PT SHORT TERM GOAL #6   Title Pt will ambulate 50' over grass surfaces with supervision using LRAD to increase pt safety attending grandson's soccer games.   (Target date: 08/29/15)   Baseline Met 3/27.   Status Achieved   PT SHORT TERM GOAL #7   Title Pt will perform floor transfer (supine on floor > standing) with min A without use of external aid to indicate increased safety with fall recovery.   (Target date: 08/29/15)   Baseline 3/29: attempted for the first time with use of external aid (mat table); however, pt performed  final trial with (S).   Status Partially Met           PT Long Term Goals - 08/08/15 2010    PT LONG TERM GOAL #1   Title Pt will improve gait velocity from 1.79 ft/sec to > / = 2.39 ft/sec to indicate increased efficiency of ambulation.   (Target date: 09/19/15)   PT LONG TERM GOAL #2   Title Pt will score > / = 19/30 on FGA to indicate decreased fall risk.   (Target date: 09/19/15)   PT LONG TERM GOAL #3   Title Pt will ambulate > 500' over unlevel, paved surfaces without AFO with mod I using LRAD with no L ankle instability to indicate safety with community mobility.  (Target  date: 09/19/15)   PT LONG TERM GOAL #4   Title Pt will ambulate 84' over unlevel, grass surfaces with mod I using LRAD to indicate increased independence attending grandson's soccer games.    (Target date: 09/19/15)   PT LONG TERM GOAL #5   Title Pt will perform floor transfer (supine on floor > standing) with supervision without use of external aid to indicate increased safety with fall recovery.   (Target date: 09/19/15)   Additional Long Term Goals   Additional Long Term Goals Yes   PT LONG TERM GOAL #6   Title Pt will negotiate 4 stairs without rails with mod I using LRAD to indicate increased access to community.   (Target date: 09/19/15)               Plan - 09/12/15 2301    Clinical Impression Statement Orthotist present to assess pt gait pattern and determine if pt appropriate candidate for less restrictive AFO than L [place] AFO, which pt received following CVA 2 years ago. When wearing L PLS AFO, pt reported increased balance confidence and exhibited increased L knee flexion during LLE advancement; improved M/L ankle stability (both over level surfaces and over grass); improved lateral weight shift to L side; and decreased LUE hypertonicity. Pt, wife, orthotist, and this PT all in agreement that L PLS is best option for maximizing stability/independence with gait over varying surfaces.    Rehab  Potential Good   Clinical Impairments Affecting Rehab Potential Time since CVA (> 2 years)   PT Frequency 2x / week   PT Duration 6 weeks   PT Treatment/Interventions ADLs/Self Care Home Management;DME Instruction;Gait training;Stair training;Functional mobility training;Therapeutic activities;Therapeutic exercise;Balance training;Orthotic Fit/Training;Patient/family education;Neuromuscular re-education;Vestibular   PT Next Visit Plan GCODES and progress note. * Address stairs with pt/wife ( 2 stairs, no rails without hands-on assist; focus on L foot placement during descent). Gait training with L PLS AFO.    Recommended Other Services 4/12: sent telephone encounter to Dr. Letta Pate requesting order for L AFO. Once received, fax to [facility] along with Face sheet.   Consulted and Agree with Plan of Care Patient;Family member/caregiver   Family Member Consulted wife, Donald [name]      Patient will benefit from skilled therapeutic intervention in order to improve the following deficits and impairments:  Abnormal gait, Decreased balance, Decreased coordination, Increased edema, Impaired flexibility, Impaired sensation, Impaired tone, Decreased strength  Visit Diagnosis: Spastic hemiplegia affecting left nondominant side (HCC)  Other abnormalities of gait and mobility     Problem List Patient Active Problem List   Diagnosis Date Noted  . Snoring 07/03/2015  . Persistent atrial fibrillation ([place]) 04/16/2015  . Hypokalemia 09/20/2014  . HTN (hypertension) 09/19/2014  . HLD (hyperlipidemia) 09/19/2014  . Diabetes mellitus without complication ([place]) 60/03/9322  . Jerking 09/19/2014  . Alterations of sensations, late effect of cerebrovascular disease 11/07/2013  . Disturbances of vision, late effect of cerebrovascular disease 11/07/2013  . Sinus bradycardia 08/08/2013  . 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[place]) 07/26/2013  . Left-sided neglect 07/26/2013  . Small vessel disease ([place])  06/07/2013  . Obesity, unspecified 06/07/2013  . CVA (cerebral infarction) 06/07/2013  . large right middle cerebral artery infarct, embolic 55/73/2202  . Hypertension 06/03/2013  . Diabetes ([place]) 06/03/2013    Billie Ruddy, PT, DPT [facility] [address] [place], [place], [address] Phone: [phone]   Fax:  [phone] 09/12/2015, 11:06 PM  Name: Jye Fariss MRN: [id number] Date of Birth:  1945-12-12

## 2015-09-12 NOTE — Telephone Encounter (Signed)
Dr. Letta Pate,  Mr. Chilcoat has been utilizing a Left AFO during PT. When wearing a left posterior leaf spring AFO, patient demonstrates increased L ankle stability, more normalized weight shift, and less LUE tone. The patient would benefit from a new left AFO to decrease fall risk and ensure left ankle stability during ambulation. Mr. Overgaard is in full agreement with this.   f you agree, please submit an order for a new left AFO.   Thank you, Billie Ruddy, PT, DPT [facility] [address] [place], [place], [address] Phone: [phone]   Fax:  [phone] 09/12/2015, 4:39 PM

## 2015-09-13 ENCOUNTER — Encounter: Payer: Self-pay | Admitting: Occupational Therapy

## 2015-09-13 NOTE — Therapy (Signed)
[facility] [address] [place], [place], [address] Phone: [phone]   Fax:  [phone]  Occupational Therapy Treatment  Patient Details  Name: Donald [name] MRN: [id number] Date of Birth: 11-13-1945 Referring Provider: Dr. Margette Fast  Encounter Date: 09/12/2015      OT End of Session - 09/13/15 1302    Visit Number 8   Number of Visits 17   Date for OT Re-Evaluation 10/03/15   Authorization Type Medicare   Authorization Time Period Progress note / gcode 10th visit   Authorization - Visit Number 8   Authorization - Number of Visits 10   OT Start Time 1450   OT Stop Time 1530   OT Time Calculation (min) 40 min   Activity Tolerance Patient tolerated treatment well   Behavior During Therapy [facility] for tasks assessed/performed      Past Medical History  Diagnosis Date  . Hypertension   . Diabetes mellitus without complication ([place])   . Hyperlipemia   . large right middle cerebral artery infarct, embolic [id number]    a. s/p IV tPA, b. source unknown, c. loop recorder placed  . Snoring 07/03/2015    Past Surgical History  Procedure Laterality Date  . Tee without cardioversion N/A 06/07/2013    Procedure: TRANSESOPHAGEAL ECHOCARDIOGRAM (TEE);  Surgeon: [name], MD;  Location: [facility] ENDOSCOPY;  Service: Cardiovascular;  Laterality: N/A;  . Loop recorder implant  06/07/2013    MDT LinQ implanted by Dr [name] for cryptogenic stroke  . Loop recorder implant N/A 06/07/2013    Procedure: LOOP RECORDER IMPLANT;  Surgeon: [name], MD;  Location: [facility] CATH LAB;  Service: Cardiovascular;  Laterality: N/A;    There were no vitals filed for this visit.      Subjective Assessment - 09/13/15 1248    Subjective  I am sure I could getr into and out of the shower by myself   Patient is accompained by: Family member   Pertinent History Afib, seizure 09/2014,    Currently in Pain? No/denies   Multiple Pain  Sites No                      OT Treatments/Exercises (OP) - 09/13/15 0001    ADLs   Bathing Long therapeutic conversation with patient and wife to establish an objective plan for independence with shower transfers.  Patient's wife agrees to step back incrementally to allow increased independence with shower transfers.     Functional Mobility Patient feels he would be able to go up and down his two front steps at home at this point.  Patient's wife reports that she often stands close, and at times feels the need to hold onto patient for safe travel up/down stairs.  Discussed possible chance for independence with these stairs in particular with patient's Physical Therapist to determine.     Driving Discussed potential for a graduated driving program.  Patient is eager to pursue simple driving within the course of the year.  Patient's wife has reservations.  Patient and wife agreeable to try possible drive practice in open, empty parking lot.  Wife is agreeable to continue to think and pray on this situation.     ADL Comments Patient really hopes for gradual increase in independence with ADL/IADL. Patient and wife work well together to determine readiness for increased independence.                  OT Education -  09/13/15 1301    Education provided Yes   Education Details reduction in supervision / support   Person(s) Educated Patient;Spouse   Methods Explanation   Comprehension Verbalized understanding          OT Short Term Goals - 09/12/15 1452    OT SHORT TERM GOAL #1   Status Achieved   OT SHORT TERM GOAL #2   Title Patient will demonstrate improved coordination in left hand as evidenced by decrease time on 9 hole peg test by 10 seconds   Status Achieved   OT SHORT TERM GOAL #3   Title Patient will demonstrate ability to support neck of guitar with left hand while seated, and demonstrate adequate finger control to form 1-2 cord positions with increased time    Status Achieved   OT SHORT TERM GOAL #4   Title Patient will adequately use bilateral hands to slice a soft fruit or vegetable (tomatao) without incident with minimal environmental set up and cueing   Status Achieved   OT SHORT TERM GOAL #5   Title Patient will demonstrate adequate strength and control of left UE to lift an object weighing up to 3 lb weight into overhead shelf    Status Achieved           OT Long Term Goals - 09/06/15 1509    OT LONG TERM GOAL #1   Title Patient will complete update home exercise / home activity program independently (due 10/03/15)   Status On-going   OT LONG TERM GOAL #2   Title Patient will demonstrate improved fine motor coordination in left UE as evidenced by 20 second reduction in time on 9 hole peg test   OT LONG TERM GOAL #3   Title Patient will adjust left hand position on neck of guitar for 3-4 chord changes in time with simple song.   Status On-going   OT LONG TERM GOAL #4   Title Patient will unload dishwasher lifting 2-3 plates at once (or comparable weight)  into overhead shelf    Status On-going   OT LONG TERM GOAL #5   Title Patient will carry beverage in left hand  while walking in community at least 150 feet,using cane in right hand in minimally crowded / congested environment    Status On-going               Plan - 09/13/15 1303    Clinical Impression Statement Patient may opt to discontinue goals relating to guitar playing due to hypersensitivty in fingertips.    Rehab Potential Good   OT Frequency 2x / week   OT Duration 8 weeks   OT Treatment/Interventions Self-care/ADL training;Ultrasound;Therapeutic exercise;Neuromuscular education;Manual Therapy;Functional Mobility Training;DME and/or AE instruction;Splinting;Therapeutic exercises;Therapeutic activities;Balance training;Patient/family education   Plan NMR LUE    OT Home Exercise Plan Initiated 08/21/15, reviewed 08/22/15   Consulted and Agree with Plan of Care  Patient;Family member/caregiver   Family Member Consulted wife Donald [name]      Patient will benefit from skilled therapeutic intervention in order to improve the following deficits and impairments:  Decreased activity tolerance, Decreased balance, Decreased cognition, Decreased coordination, Decreased range of motion, Decreased mobility, Decreased strength, Difficulty walking, Impaired perceived functional ability, Improper body mechanics, Impaired UE functional use, Impaired tone, Impaired sensation  Visit Diagnosis: 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 Snoring 07/03/2015  .  Persistent atrial fibrillation ([place]) 04/16/2015  . Hypokalemia 09/20/2014  . HTN (hypertension) 09/19/2014  . HLD (hyperlipidemia) 09/19/2014  . Diabetes mellitus without complication ([place]) [id number]  . Jerking 09/19/2014  . Alterations of sensations, late effect of cerebrovascular disease 11/07/2013  . Disturbances of vision, late effect of cerebrovascular disease 11/07/2013  . Sinus bradycardia 08/08/2013  . 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[place]) 07/26/2013  . Left-sided neglect 07/26/2013  . Small vessel disease ([place]) 06/07/2013  . Obesity, unspecified 06/07/2013  . CVA (cerebral infarction) 06/07/2013  . large right middle cerebral artery infarct, embolic [id number]  . Hypertension 06/03/2013  . Diabetes ([place]) 06/03/2013    Mariah Milling, OTR/L 09/13/2015, 1:05 PM  [facility] [address] [place], [place], [address] Phone: [phone]   Fax:  [phone]  Name: Donald [name] MRN: [id number] Date of Birth: June 18, 1945

## 2015-09-17 ENCOUNTER — Ambulatory Visit: Payer: Medicare Other | Admitting: Physical Therapy

## 2015-09-17 ENCOUNTER — Ambulatory Visit: Payer: Medicare Other | Admitting: Occupational Therapy

## 2015-09-17 ENCOUNTER — Other Ambulatory Visit: Payer: Self-pay | Admitting: Neurology

## 2015-09-17 DIAGNOSIS — G8114 Spastic hemiplegia affecting left nondominant side: Secondary | ICD-10-CM

## 2015-09-17 DIAGNOSIS — R2689 Other abnormalities of gait and mobility: Secondary | ICD-10-CM

## 2015-09-17 DIAGNOSIS — R208 Other disturbances of skin sensation: Secondary | ICD-10-CM | POA: Diagnosis not present

## 2015-09-17 DIAGNOSIS — R2681 Unsteadiness on feet: Secondary | ICD-10-CM | POA: Diagnosis not present

## 2015-09-17 DIAGNOSIS — R279 Unspecified lack of coordination: Secondary | ICD-10-CM | POA: Diagnosis not present

## 2015-09-17 DIAGNOSIS — M21372 Foot drop, left foot: Secondary | ICD-10-CM

## 2015-09-17 DIAGNOSIS — R278 Other lack of coordination: Secondary | ICD-10-CM | POA: Diagnosis not present

## 2015-09-17 NOTE — Therapy (Signed)
[facility] [address] [place], [place], [address] Phone: [phone]   Fax:  [phone]  Physical Therapy Treatment  Patient Details  Name: Donald [name] MRN: [id number] Date of Birth: 1945/09/09 Referring Provider: Ernestine Mcmurray, NP  Encounter Date: 09/17/2015      PT End of Session - 09/17/15 1949    Visit Number 10   Number of Visits 13   Date for PT Re-Evaluation 09/22/15   Authorization Type Medicare primary; [employer] secondary - G Codes and PN required every 10 visits   PT Start Time 1542   PT Stop Time 1630   PT Time Calculation (min) 48 min   Equipment Utilized During Treatment Other (comment)  Balance Master and harness   Activity Tolerance Patient tolerated treatment well   Behavior During Therapy [facility] for tasks assessed/performed      Past Medical History  Diagnosis Date  . Hypertension   . Diabetes mellitus without complication ([place])   . Hyperlipemia   . large right middle cerebral artery infarct, embolic 0/01/6282    a. s/p IV tPA, b. source unknown, c. loop recorder placed  . Snoring 07/03/2015    Past Surgical History  Procedure Laterality Date  . Tee without cardioversion N/A 06/07/2013    Procedure: TRANSESOPHAGEAL ECHOCARDIOGRAM (TEE);  Surgeon: [name], MD;  Location: [facility] ENDOSCOPY;  Service: Cardiovascular;  Laterality: N/A;  . Loop recorder implant  06/07/2013    MDT LinQ implanted by Dr [name] for cryptogenic stroke  . Loop recorder implant N/A 06/07/2013    Procedure: LOOP RECORDER IMPLANT;  Surgeon: [name], MD;  Location: [facility] CATH LAB;  Service: Cardiovascular;  Laterality: N/A;    There were no vitals filed for this visit.      Subjective Assessment - 09/17/15 1936    Subjective Pt reports no significant changes. Pt feels as though he is making progress in therapy and is looking forward to using L ankle brace as a "training tool" for walking.   Patient  is accompained by: Family member  wife, Donald [name]   Pertinent History Pt likes to be called "[name]".   PMH significant for: CVA (large R MCA infarct, embolic),  seizures, HTN, HLD, DM, persistent A-Fib   Patient Stated Goals Work on walking downstairs, work on walking technique to make it better, and to work on my reaction time when I lose my balance to my left side.   Currently in Pain? No/denies                         [facility] Adult PT Treatment/Exercise - 09/17/15 0001    Ambulation/Gait   Ambulation/Gait Yes   Ambulation/Gait Assistance 5: Supervision;4: Min guard   Ambulation/Gait Assistance Details All gait trials performed without AD with pt wearing L PLS AFO. ait x450' over level, indoor surfaces with tactile cueing for lateral weight shift to L side during L stance; tactile (resistive) cueing at L iliac crest to promote anterior weight shift during L stance phase, to increase L pelvic protraction. Gait x300' over unlevel, paved surfaces with (S),  intermittent posterior LOB with effective self-recovery. Gait x225' over unlevel, grass surfaces with (S) to min guard due to occasional posterior LOB (although pt able to recover without hands-on assist). Cueing also focused on decreasing LLE step length and increasing RLE step length to promote lateral weight shift to L. Gait x75' using line on floor as visual cue for more  normalized BOS due to tendency toward LLE ABD.    Ambulation Distance (Feet) 975 Feet  total   Assistive device None;Other (Comment)  L PLS AFO   Gait Pattern Step-through pattern;Decreased arm swing - left;Decreased hip/knee flexion - left;Decreased weight shift to left;Abducted - left;Narrow base of support  posterior preference   Ambulation Surface Level;Unlevel;Indoor;Outdoor;Paved;Grass   Therapeutic Activites    Therapeutic Activities Other Therapeutic Activities   Other Therapeutic Activities Pt donned/doffed L PLS AFO x2 trials total. Initial trial  without use of shoe horn with cueing for use of figure 4 position in seated and for technique requiring mod A to don, supervision to doff. Subsequent trial with shoe horn, pt donned AFO with min A (to tie shoes only) and doffed with mod I.    Neuro Re-ed    Neuro Re-ed Details  Supine L iliopsoas, rectus femoris self-stretch x1.5 minutes for spasticity management. Utilized Pension scheme manager Limits of Stability (stable force plate and surround) for visual feedback to improve midline orientation (align center x30 seconds), to increase lateral weight shift to L side (center, left/lateral with 25% LOS, 10-sec timer, 1-minute duration x2 trials); and to promote both anterior and lateral weight shift to L during LLE advancement (center, left/anterior, 25% LOS, 10-sec timer, 1-minute duration x2 trials).                PT Education - 09/17/15 1937    Education provided Yes   Education Details Techniques for donning/doffing L PLS AFO.   Person(s) Educated Patient;Spouse   Methods Explanation;Demonstration   Comprehension Verbalized understanding;Returned demonstration          PT Short Term Goals - 09/06/15 1507    PT SHORT TERM GOAL #1   Title Pt will perform initial HEP with mod I using paper handout to indicate safe daily compliance with home program.   (Target date: 08/29/15)  Correction: Target date 08/29/15   Baseline Met 3/29.   Status Achieved   PT SHORT TERM GOAL #2   Title Complete FGA and improve score by 4 points to indicate improved dynamic gait stability.   (Target date: 08/29/15)   Baseline 08/15/15: baseline 11/30 today. 09/06/15: scored 14/30 today, 3 point improvement   Status Not Met   PT SHORT TERM GOAL #3   Title Pt will improve gait velocity from 1.79 ft/sec to > / = 2.09 ft/sec to indicate decreased risk of recurrent falls.   (Target date: 08/29/15)   Baseline 3/29: gait velocity = 2.33 ft/sec   Status Achieved   PT SHORT TERM GOAL #4   Title Pt will negotiate 2 stairs  without rails with mod I using LRAD to indicate increased independence using primary home entrance.  (Target date: 08/29/15)   Baseline Met 3/29 using SPC.   Status Achieved   PT SHORT TERM GOAL #5   Title Pt will ambulate > 300' over level, indoor surfaces without AFO with no evidence of L ankle instability (excessive forefoot supination) to indicate increased safety with ambulation.   (Target date: 08/29/15)   Baseline Met 3/27.   Status Achieved   PT SHORT TERM GOAL #6   Title Pt will ambulate 50' over grass surfaces with supervision using LRAD to increase pt safety attending grandson's soccer games.   (Target date: 08/29/15)   Baseline Met 3/27.   Status Achieved   PT SHORT TERM GOAL #7   Title Pt will perform floor transfer (supine on floor > standing) with min A without use of  external aid to indicate increased safety with fall recovery.   (Target date: 08/29/15)   Baseline 3/29: attempted for the first time with use of external aid (mat table); however, pt performed final trial with (S).   Status Partially Met           PT Long Term Goals - 08/08/15 2010    PT LONG TERM GOAL #1   Title Pt will improve gait velocity from 1.79 ft/sec to > / = 2.39 ft/sec to indicate increased efficiency of ambulation.   (Target date: 09/19/15)   PT LONG TERM GOAL #2   Title Pt will score > / = 19/30 on FGA to indicate decreased fall risk.   (Target date: 09/19/15)   PT LONG TERM GOAL #3   Title Pt will ambulate > 500' over unlevel, paved surfaces without AFO with mod I using LRAD with no L ankle instability to indicate safety with community mobility.  (Target date: 09/19/15)   PT LONG TERM GOAL #4   Title Pt will ambulate 38' over unlevel, grass surfaces with mod I using LRAD to indicate increased independence attending grandson's soccer games.    (Target date: 09/19/15)   PT LONG TERM GOAL #5   Title Pt will perform floor transfer (supine on floor > standing) with supervision without use of external aid  to indicate increased safety with fall recovery.   (Target date: 09/19/15)   Additional Long Term Goals   Additional Long Term Goals Yes   PT LONG TERM GOAL #6   Title Pt will negotiate 4 stairs without rails with mod I using LRAD to indicate increased access to community.   (Target date: 09/19/15)               Plan - 10-08-15 1955    Clinical Impression Statement Session focused on gait training using L PLS AFO, as pt plans to receive personal PLS AFO within next 2-3 weeks. Pt very motivated and making progress toward LTG's. Will plan to address goals at next session and decide on POC based on pt progress.   Rehab Potential Good   Clinical Impairments Affecting Rehab Potential Time since CVA (> 2 years)   PT Frequency 2x / week   PT Duration 6 weeks   PT Treatment/Interventions ADLs/Self Care Home Management;DME Instruction;Gait training;Stair training;Functional mobility training;Therapeutic activities;Therapeutic exercise;Balance training;Orthotic Fit/Training;Patient/family education;Neuromuscular re-education;Vestibular   PT Next Visit Plan Check goals and DC vs recert.  Address stairs with pt/wife ( 2 stairs, no rails without hands-on assist). Gait training with L PLS AFO.    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 Snoring 07/03/2015  . Persistent atrial fibrillation ([place]) 04/16/2015  . Hypokalemia 09/20/2014  . HTN (hypertension) 09/19/2014  . HLD (hyperlipidemia) 09/19/2014  . Diabetes mellitus without complication ([place]) 39/76/7341  . Jerking 09/19/2014  . Alterations of sensations, late effect of cerebrovascular disease 11/07/2013  . Disturbances of vision, late effect of cerebrovascular disease 11/07/2013  . Sinus bradycardia 08/08/2013  . 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[place]) 07/26/2013  . Left-sided neglect 07/26/2013  . Small vessel disease ([place]) 06/07/2013  . Obesity, unspecified 06/07/2013  . CVA (cerebral infarction) 06/07/2013  . large right middle cerebral artery infarct, embolic 93/79/0240  . Hypertension 06/03/2013  . Diabetes ([place]) 06/03/2013   Physical Therapy Progress Note  Dates of Reporting Period: 08/08/15 to 09/17/15  Objective Reports of Subjective Statement: See Subjective section above.  Objective Measurements: gait velocity = 2.33 ft/sec; FGA = 14/30  Goal Update: No goal changes at this time; see above goals and statuses for details.   Plan: Continue to address goals. At next session, will assess POC at next session based on LTG's and progress.  Reason Skilled Services are Required: To maximize stability and independence with functional mobility and decrease fall risk.     Billie Ruddy, PT, DPT [facility] [address] [place], [place], [address] Phone: [phone]   Fax:   [phone] 09/17/2015, 8:01 PM  Name: Donald [name] MRN: [id number] Date of Birth: 05/22/1946

## 2015-09-17 NOTE — Telephone Encounter (Signed)
I did not order the PT, I haven't seen pt for almost a year, either contact ordering MD for order or have pt make f/u appt with me

## 2015-09-17 NOTE — Progress Notes (Signed)
The patient is seeing physical therapy now, it appears that an AFO brace may help the left sided footdrop. I have written a prescription.

## 2015-09-19 ENCOUNTER — Ambulatory Visit: Payer: Medicare Other | Admitting: Physical Therapy

## 2015-09-19 ENCOUNTER — Encounter: Payer: Self-pay | Admitting: Occupational Therapy

## 2015-09-19 ENCOUNTER — Ambulatory Visit: Payer: Medicare Other | Admitting: Occupational Therapy

## 2015-09-19 DIAGNOSIS — R2689 Other abnormalities of gait and mobility: Secondary | ICD-10-CM

## 2015-09-19 DIAGNOSIS — G8114 Spastic hemiplegia affecting left nondominant side: Secondary | ICD-10-CM

## 2015-09-19 DIAGNOSIS — R208 Other disturbances of skin sensation: Secondary | ICD-10-CM

## 2015-09-19 DIAGNOSIS — R278 Other lack of coordination: Secondary | ICD-10-CM | POA: Diagnosis not present

## 2015-09-19 DIAGNOSIS — R279 Unspecified lack of coordination: Secondary | ICD-10-CM

## 2015-09-19 DIAGNOSIS — R2681 Unsteadiness on feet: Secondary | ICD-10-CM

## 2015-09-19 NOTE — Therapy (Signed)
[facility] [address] [place], [place], [address] Phone: [phone]   Fax:  [phone]  Physical Therapy Treatment  Patient Details  Name: Donald [name] MRN: [id number] Date of Birth: [birth year] Referring Provider: Ernestine Mcmurray, NP  Encounter Date: 09/19/2015      PT End of Session - 09/19/15 2033    Visit Number 11   Number of Visits 15  Requesting 4 additional sessions   Date for PT Re-Evaluation 10/19/15   Authorization Type Medicare primary; [employer] secondary - G Codes and PN required every 10 visits   PT Start Time 1446   PT Stop Time 1526   PT Time Calculation (min) 40 min   Equipment Utilized During Treatment Gait belt   Activity Tolerance Patient tolerated treatment well   Behavior During Therapy [facility] for tasks assessed/performed      Past Medical History  Diagnosis Date  . Hypertension   . Diabetes mellitus without complication ([place])   . Hyperlipemia   . large right middle cerebral artery infarct, embolic 09/01/6382    a. s/p IV tPA, b. source unknown, c. loop recorder placed  . Snoring 07/03/2015    Past Surgical History  Procedure Laterality Date  . Tee without cardioversion N/A 06/07/2013    Procedure: TRANSESOPHAGEAL ECHOCARDIOGRAM (TEE);  Surgeon: [name], MD;  Location: [facility] ENDOSCOPY;  Service: Cardiovascular;  Laterality: N/A;  . Loop recorder implant  06/07/2013    MDT LinQ implanted by Dr [name] for cryptogenic stroke  . Loop recorder implant N/A 06/07/2013    Procedure: LOOP RECORDER IMPLANT;  Surgeon: [name], MD;  Location: [facility] CATH LAB;  Service: Cardiovascular;  Laterality: N/A;    There were no vitals filed for this visit.      Subjective Assessment - 09/19/15 2039    Subjective "I did that quad stretch twice last night and once today."   Patient is accompained by: Family member  wife, [name]   Pertinent History Pt likes to be called "[name]".   PMH  significant for: CVA (large R MCA infarct, embolic),  seizures, HTN, HLD, DM, persistent A-Fib   Patient Stated Goals Work on walking downstairs, work on walking technique to make it better, and to work on my reaction time when I lose my balance to my left side.   Currently in Pain? No/denies            [facility] PT Assessment - 09/19/15 0001    Assessment   Medical Diagnosis abnormality of gait; history of CVA   Functional Gait  Assessment   Gait assessed  Yes   Gait Level Surface Walks 20 ft, slow speed, abnormal gait pattern, evidence for imbalance or deviates 10-15 in outside of the 12 in walkway width. Requires more than 7 sec to ambulate 20 ft.   Change in Gait Speed Able to change speed, demonstrates mild gait deviations, deviates 6-10 in outside of the 12 in walkway width, or no gait deviations, unable to achieve a major change in velocity, or uses a change in velocity, or uses an assistive device.   Gait with Horizontal Head Turns Performs head turns smoothly with slight change in gait velocity (eg, minor disruption to smooth gait path), deviates 6-10 in outside 12 in walkway width, or uses an assistive device.   Gait with Vertical Head Turns Performs task with slight change in gait velocity (eg, minor disruption to smooth gait path), deviates 6 - 10 in outside 12 in  walkway width or uses assistive device   Gait and Pivot Turn Pivot turns safely in greater than 3 sec and stops with no loss of balance, or pivot turns safely within 3 sec and stops with mild imbalance, requires small steps to catch balance.   Step Over Obstacle Is able to step over one shoe box (4.5 in total height) but must slow down and adjust steps to clear box safely. May require verbal cueing.   Gait with Narrow Base of Support Ambulates less than 4 steps heel to toe or cannot perform without assistance.   Gait with Eyes Closed Walks 20 ft, slow speed, abnormal gait pattern, evidence for imbalance, deviates 10-15 in  outside 12 in walkway width. Requires more than 9 sec to ambulate 20 ft.   Ambulating Backwards Walks 20 ft, uses assistive device, slower speed, mild gait deviations, deviates 6-10 in outside 12 in walkway width.   Steps Two feet to a stair, must use rail.   Total Score 14                     [facility] Adult PT Treatment/Exercise - 09/19/15 0001    Transfers   Floor to Transfer 4: Min assist;6: Modified independent (Device/Increase time);3: Mod assist   Floor to Transfer Details (indicate cue type and reason) Floor transfer (supine on floor > standing) x3 trials total: initial trial with mod I using mat table for support; subsequent trial without external support with mod A to control descent, tactile cueing at L knee for joint protection. Final trial without external support with min A to prevent posterior LOB when transitioning from half kneeling > standing.   Ambulation/Gait   Ambulation/Gait No   Ambulation/Gait Assistance 6: Modified independent (Device/Increase time)   Ambulation Distance (Feet) 500 Feet   Assistive device None;Other (Comment)  L PLS AFO   Gait Pattern Step-through pattern;Decreased arm swing - left;Decreased hip/knee flexion - left;Decreased weight shift to left;Abducted - left;Narrow base of support  posterior preference   Ambulation Surface Level;Indoor   Stairs Yes   Stairs Assistance 6: Modified independent (Device/Increase time)   Stair Management Technique No rails;With cane   Number of Stairs 4   Height of Stairs 6                  PT Short Term Goals - 09/06/15 1507    PT SHORT TERM GOAL #1   Title Pt will perform initial HEP with mod I using paper handout to indicate safe daily compliance with home program.   (Target date: 08/29/15)  Correction: Target date 08/29/15   Baseline Met 3/29.   Status Achieved   PT SHORT TERM GOAL #2   Title Complete FGA and improve score by 4 points to indicate improved dynamic gait stability.   (Target  date: 08/29/15)   Baseline 08/15/15: baseline 11/30 today. 09/06/15: scored 14/30 today, 3 point improvement   Status Not Met   PT SHORT TERM GOAL #3   Title Pt will improve gait velocity from 1.79 ft/sec to > / = 2.09 ft/sec to indicate decreased risk of recurrent falls.   (Target date: 08/29/15)   Baseline 3/29: gait velocity = 2.33 ft/sec   Status Achieved   PT SHORT TERM GOAL #4   Title Pt will negotiate 2 stairs without rails with mod I using LRAD to indicate increased independence using primary home entrance.  (Target date: 08/29/15)   Baseline Met 3/29 using SPC.   Status Achieved  PT SHORT TERM GOAL #5   Title Pt will ambulate > 300' over level, indoor surfaces without AFO with no evidence of L ankle instability (excessive forefoot supination) to indicate increased safety with ambulation.   (Target date: 08/29/15)   Baseline Met 3/27.   Status Achieved   PT SHORT TERM GOAL #6   Title Pt will ambulate 50' over grass surfaces with supervision using LRAD to increase pt safety attending grandson's soccer games.   (Target date: 08/29/15)   Baseline Met 3/27.   Status Achieved   PT SHORT TERM GOAL #7   Title Pt will perform floor transfer (supine on floor > standing) with min A without use of external aid to indicate increased safety with fall recovery.   (Target date: 08/29/15)   Baseline 3/29: attempted for the first time with use of external aid (mat table); however, pt performed final trial with (S).   Status Partially Met           PT Long Term Goals - 09/19/15 1518    PT LONG TERM GOAL #1   Title Pt will improve gait velocity from 1.79 ft/sec to > / = 2.39 ft/sec to indicate increased efficiency of ambulation.     (Modified Target date: 10/03/15)   Baseline 4/19: gait velocity = 2.27 ft/sec   Status Partially Met   PT LONG TERM GOAL #2   Title Pt will score > / = 19/30 on FGA to indicate decreased fall risk.   (Target date: 09/19/15)   Baseline Deferred; pt unlikely to meet tihs goal  due to CVA-related gait impairments.    Status Deferred   PT LONG TERM GOAL #3   Title Pt will ambulate > 500' over unlevel, paved surfaces without AFO with mod I using LRAD with no L ankle instability to indicate safety with community mobility.  (Target date: 09/19/15)   Status Achieved   PT LONG TERM GOAL #4   Title Pt will ambulate 65' over unlevel, grass surfaces with mod I using LRAD to indicate increased independence attending grandson's soccer games.   (Modified Target date: 10/03/15)   Baseline 4/19: unable to assess due to rainy weather, wet grass today. Continue goal through renewed POC.   Continue goal through renewed POC.    Status On-going   PT LONG TERM GOAL #5   Title Pt will perform floor transfer (supine on floor > standing) with supervision without use of external aid to indicate increased safety with fall recovery.  (Modified Target date: 10/03/15)   Baseline 4/19: Pt required min A for floor transfer without exgternal aid.   Continue goal through renewed POC.    Status Partially Met   Additional Long Term Goals   Additional Long Term Goals Yes   PT LONG TERM GOAL #6   Title Pt will negotiate 4 stairs without rails with mod I using LRAD to indicate increased access to community.   (Target date: 09/19/15)   Baseline Met 4/19.   Status Achieved   PT LONG TERM GOAL #7   Title Pt will negotiate 4 stairs with single rail using reciprocal pattern with supervision to address pt-stated goal and to indicate progress toward PLOF.  (Target date: 10/03/15)   Status New               Plan - 09/19/15 2035    Clinical Impression Statement Since beginning this episode of PT, pt has demonstrated significant improvement in LLE attention during functional mobility, symmetrical LE placement and  WB, and more normalized gait pattern. Pt has exhibited excellent motivation and compliance with PT. Pt has not yet received personal  AFO. Pt would benefit from skilled outpatient PT 2x/week for 2  additional weeks for gait training with personal L AFO and to maximize stability and independence with functional mobility. Deferred LTG for FGA, as CVA-related gait impairments will limit pt ability to score 19/30. Pt continues to progress toward goals for gait velocity and floor transfer without extrnal aid.    Clinical Impairments Affecting Rehab Potential Time since CVA (> 2 years)   PT Frequency 2x / week   PT Duration 2 weeks   PT Treatment/Interventions ADLs/Self Care Home Management;DME Instruction;Gait training;Stair training;Functional mobility training;Therapeutic activities;Therapeutic exercise;Balance training;Orthotic Fit/Training;Patient/family education;Neuromuscular re-education;Vestibular   PT Next Visit Plan Address recently added LTG for stairs with reciprocal pattern.   PT Home Exercise Plan L hip flexor self-stretch   Consulted and Agree with Plan of Care Patient   Family Member Consulted wife, [name]      Patient will benefit from skilled therapeutic intervention in order to improve the following deficits and impairments:  Abnormal gait, Decreased balance, Decreased coordination, Increased edema, Impaired flexibility, Impaired sensation, Impaired tone, Decreased strength  Visit Diagnosis: Spastic hemiplegia affecting left nondominant side ([place]) - Plan: PT plan of care cert/re-cert  Other abnormalities of gait and mobility - Plan: PT plan of care cert/re-cert  Other disturbances of skin sensation - Plan: PT plan of care cert/re-cert  Unsteadiness on feet - Plan: PT plan of care cert/re-cert  Unspecified lack of coordination - Plan: PT plan of care cert/re-cert     Problem List Patient Active Problem List   Diagnosis Date Noted  . Snoring 07/03/2015  . Persistent atrial fibrillation ([place]) 04/16/2015  . Hypokalemia 09/20/2014  . HTN (hypertension) 09/19/2014  . HLD (hyperlipidemia) 09/19/2014  . Diabetes mellitus without complication ([place]) 16/03/9603  .  Jerking 09/19/2014  . Alterations of sensations, late effect of cerebrovascular disease 11/07/2013  . Disturbances of vision, late effect of cerebrovascular disease 11/07/2013  . Sinus bradycardia 08/08/2013  . 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[place]) 07/26/2013  . Left-sided neglect 07/26/2013  . Small vessel disease ([place]) 06/07/2013  . Obesity, unspecified 06/07/2013  . CVA (cerebral infarction) 06/07/2013  . large right middle cerebral artery infarct, embolic 54/01/8118  . Hypertension 06/03/2013  . Diabetes ([place]) 06/03/2013    Billie Ruddy, PT, DPT [facility] [address] [place], [place], [address] Phone: [phone]   Fax:  [phone] 09/19/2015, 8:48 PM  Name: Donald [name] MRN: [id number] Date of Birth: [birth year]

## 2015-09-19 NOTE — Therapy (Signed)
[facility] [address] [place], [place], [address] Phone: [phone]   Fax:  [phone]  Occupational Therapy Treatment  Patient Details  Name: Donald [name] MRN: [id number] Date of Birth: Feb 12, 1946 Referring Provider: Dr. Margette Fast  Encounter Date: 09/19/2015      OT End of Session - 09/19/15 1515    Visit Number 9   Number of Visits 17   Date for OT Re-Evaluation 10/03/15   Authorization Type Medicare   Authorization Time Period Progress note / gcode 10th visit   Authorization - Visit Number 9   Authorization - Number of Visits 10   OT Start Time [id number]   OT Stop Time 1445   OT Time Calculation (min) 47 min   Activity Tolerance Patient tolerated treatment well   Behavior During Therapy [facility] for tasks assessed/performed      Past Medical History  Diagnosis Date  . Hypertension   . Diabetes mellitus without complication ([place])   . Hyperlipemia   . large right middle cerebral artery infarct, embolic [id number]    a. s/p IV tPA, b. source unknown, c. loop recorder placed  . Snoring 07/03/2015    Past Surgical History  Procedure Laterality Date  . Tee without cardioversion N/A 06/07/2013    Procedure: TRANSESOPHAGEAL ECHOCARDIOGRAM (TEE);  Surgeon: [name], MD;  Location: [facility] ENDOSCOPY;  Service: Cardiovascular;  Laterality: N/A;  . Loop recorder implant  06/07/2013    MDT LinQ implanted by Dr [name] for cryptogenic stroke  . Loop recorder implant N/A 06/07/2013    Procedure: LOOP RECORDER IMPLANT;  Surgeon: [name], MD;  Location: [facility] CATH LAB;  Service: Cardiovascular;  Laterality: N/A;    There were no vitals filed for this visit.      Subjective Assessment - 09/19/15 1503    Subjective  I have not given up on the guitar.  I am looking into the slide technique.   Patient is accompained by: Family member   Pertinent History Afib, seizure 09/2014,    Patient Stated Goals Play guitar     Currently in Pain? No/denies   Multiple Pain Sites No                      OT Treatments/Exercises (OP) - 09/19/15 0001    ADLs   Leisure Patient is still pursuing whether or not returning to guitar play is possible for him.  He has found an inexpensive copper slide which would fit over fingers to help him cord with left hand with less individual fingertip contact with strings.  This may reduce discomfort, parasthesias.     ADL Comments Patient continues to discuss desire for reducing burden on wife, by increasing independence.     Neurological Re-education Exercises   Other Exercises 1 Neuromuscular reeducation to address interlimb coordination in left arm.  In supine worked to reduce tension en masse to develop isolated joint control and improve fluidity of movement patterns.  Patient initially with poor ability to grade motor output.  With visual cueing, and using mirror image with right arm, patient beginning to demonstrate inproved shoulder, elbow, forearm, hand coordinated movement.  In standing worked to maintain this interlimb coordination while balancing weight between two feet in stance, and in stride position.     Other Exercises 2 After supine activity, patient had difficulty and needed increased time to transition to sitting position. Patient using extension pattern and struggling to transition up to sitting.  Discussed  and demonstrated the benefits of using combination of flexion and rotation to sit up.  Patient able to repeat after instruction stating - "That's the way I get out of bed."                OT Education - 09/19/15 1512    Education provided Yes   Education Details interlimb coordination, importance of balance weight bearing for transitional movements   Person(s) Educated Patient;Spouse   Methods Explanation;Demonstration   Comprehension Verbalized understanding;Verbal cues required          OT Short Term Goals - 09/12/15 1452    OT SHORT  TERM GOAL #1   Status Achieved   OT SHORT TERM GOAL #2   Title Patient will demonstrate improved coordination in left hand as evidenced by decrease time on 9 hole peg test by 10 seconds   Status Achieved   OT SHORT TERM GOAL #3   Title Patient will demonstrate ability to support neck of guitar with left hand while seated, and demonstrate adequate finger control to form 1-2 cord positions with increased time   Status Achieved   OT SHORT TERM GOAL #4   Title Patient will adequately use bilateral hands to slice a soft fruit or vegetable (tomatao) without incident with minimal environmental set up and cueing   Status Achieved   OT SHORT TERM GOAL #5   Title Patient will demonstrate adequate strength and control of left UE to lift an object weighing up to 3 lb weight into overhead shelf    Status Achieved           OT Long Term Goals - 09/06/15 1509    OT LONG TERM GOAL #1   Title Patient will complete update home exercise / home activity program independently (due 10/03/15)   Status On-going   OT LONG TERM GOAL #2   Title Patient will demonstrate improved fine motor coordination in left UE as evidenced by 20 second reduction in time on 9 hole peg test   OT LONG TERM GOAL #3   Title Patient will adjust left hand position on neck of guitar for 3-4 chord changes in time with simple song.   Status On-going   OT LONG TERM GOAL #4   Title Patient will unload dishwasher lifting 2-3 plates at once (or comparable weight)  into overhead shelf    Status On-going   OT LONG TERM GOAL #5   Title Patient will carry beverage in left hand  while walking in community at least 150 feet,using cane in right hand in minimally crowded / congested environment    Status On-going               Plan - 09/19/15 1515    Clinical Impression Statement Patient showing improvement in strength and motor control in left upper extremity.  Patient desires increased independence with ADL/IADL as long as considered  safe.   Rehab Potential Good   OT Frequency 2x / week   OT Duration 8 weeks   OT Treatment/Interventions Self-care/ADL training;Ultrasound;Therapeutic exercise;Neuromuscular education;Manual Therapy;Functional Mobility Training;DME and/or AE instruction;Splinting;Therapeutic exercises;Therapeutic activities;Balance training;Patient/family education   Plan NMR LUE   OT Home Exercise Plan Initiated 08/21/15, reviewed 08/22/15   Consulted and Agree with Plan of Care Patient;Family member/caregiver   Family Member Consulted wife [name]      Patient will benefit from skilled therapeutic intervention in order to improve the following deficits and impairments:  Decreased activity tolerance, Decreased balance, Decreased cognition, Decreased coordination, Decreased  range of motion, Decreased mobility, Decreased strength, Difficulty walking, Impaired perceived functional ability, Improper body mechanics, Impaired UE functional use, Impaired tone, Impaired sensation  Visit Diagnosis: Other disturbances of skin sensation  Unspecified lack of coordination  Unsteadiness on feet    Problem List Patient Active Problem List   Diagnosis Date Noted  . Snoring 07/03/2015  . Persistent atrial fibrillation ([place]) 04/16/2015  . Hypokalemia 09/20/2014  . HTN (hypertension) 09/19/2014  . HLD (hyperlipidemia) 09/19/2014  . Diabetes mellitus without complication ([place]) [id number]  . Jerking 09/19/2014  . Alterations of sensations, late effect of cerebrovascular disease 11/07/2013  . Disturbances of vision, late effect of cerebrovascular disease 11/07/2013  . Sinus bradycardia 08/08/2013  . 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[place]) 07/26/2013  . Left-sided neglect 07/26/2013  . Small vessel disease ([place]) 06/07/2013  . Obesity, unspecified 06/07/2013  . CVA (cerebral infarction) 06/07/2013  . large right middle cerebral artery infarct, embolic [id number]  . Hypertension 06/03/2013  . Diabetes  ([place]) 06/03/2013    Mariah Milling, OTR/L 09/19/2015, 3:18 PM  [facility] [address] [place], [place], [address] Phone: [phone]   Fax:  [phone]  Name: Donald [name] MRN: [id number] Date of Birth: 05-13-1946

## 2015-09-24 ENCOUNTER — Ambulatory Visit: Payer: Medicare Other | Admitting: Physical Therapy

## 2015-09-24 ENCOUNTER — Encounter: Payer: Self-pay | Admitting: Occupational Therapy

## 2015-09-24 ENCOUNTER — Ambulatory Visit: Payer: Medicare Other | Admitting: Occupational Therapy

## 2015-09-24 DIAGNOSIS — R279 Unspecified lack of coordination: Secondary | ICD-10-CM | POA: Diagnosis not present

## 2015-09-24 DIAGNOSIS — R2689 Other abnormalities of gait and mobility: Secondary | ICD-10-CM

## 2015-09-24 DIAGNOSIS — R278 Other lack of coordination: Secondary | ICD-10-CM | POA: Diagnosis not present

## 2015-09-24 DIAGNOSIS — R208 Other disturbances of skin sensation: Secondary | ICD-10-CM

## 2015-09-24 DIAGNOSIS — R2681 Unsteadiness on feet: Secondary | ICD-10-CM

## 2015-09-24 DIAGNOSIS — G8114 Spastic hemiplegia affecting left nondominant side: Secondary | ICD-10-CM | POA: Diagnosis not present

## 2015-09-24 NOTE — Therapy (Signed)
[facility] [address] [place], [place], [address] Phone: [phone]   Fax:  [phone]  Physical Therapy Treatment  Patient Details  Name: Donald [name] MRN: [id number] Date of Birth: Apr 13, 1946 Referring Provider: Ernestine Mcmurray, NP  Encounter Date: 09/24/2015      PT End of Session - 09/24/15 1627    Visit Number 12   Date for PT Re-Evaluation 10/19/15   PT Start Time 1542   PT Stop Time 1625   PT Time Calculation (min) 43 min   Equipment Utilized During Treatment Gait belt   Activity Tolerance Patient tolerated treatment well   Behavior During Therapy [facility] for tasks assessed/performed      Past Medical History  Diagnosis Date  . Hypertension   . Diabetes mellitus without complication ([place])   . Hyperlipemia   . large right middle cerebral artery infarct, embolic 0/08/90    a. s/p IV tPA, b. source unknown, c. loop recorder placed  . Snoring 07/03/2015    Past Surgical History  Procedure Laterality Date  . Tee without cardioversion N/A 06/07/2013    Procedure: TRANSESOPHAGEAL ECHOCARDIOGRAM (TEE);  Surgeon: [name], MD;  Location: [facility] ENDOSCOPY;  Service: Cardiovascular;  Laterality: N/A;  . Loop recorder implant  06/07/2013    MDT LinQ implanted by Dr [name] for cryptogenic stroke  . Loop recorder implant N/A 06/07/2013    Procedure: LOOP RECORDER IMPLANT;  Surgeon: [name], MD;  Location: [facility] CATH LAB;  Service: Cardiovascular;  Laterality: N/A;    There were no vitals filed for this visit.      Subjective Assessment - 09/24/15 1548    Subjective no complaints/concerns; no pain   Patient Stated Goals Work on walking downstairs, work on walking technique to make it better, and to work on my reaction time when I lose my balance to my left side.   Currently in Pain? No/denies                         [facility] Adult PT Treatment/Exercise - 09/24/15 1548    Knee/Hip  Exercises: Aerobic   Nustep SciFit L3 x 5 min; lower ext only      Gait training with AFO: over and around obstacles with min A progressing to supervision; mod cues for sequencing over obstacle (most successful cane, L, R sequence).  Negotiated ramp/curb with supervision; stairs with minguard A and step to pattern and min cues for ant weight shift.  Neuro Re-ed:  Modified SLS with RLE on 4" block for LLE weight bearing: horizontal/vertical head turns with min A and mod cues for technique.    Mod cues needed for attention to task throughout session.            PT Short Term Goals - 09/06/15 1507    PT SHORT TERM GOAL #1   Title Pt will perform initial HEP with mod I using paper handout to indicate safe daily compliance with home program.   (Target date: 08/29/15)  Correction: Target date 08/29/15   Baseline Met 3/29.   Status Achieved   PT SHORT TERM GOAL #2   Title Complete FGA and improve score by 4 points to indicate improved dynamic gait stability.   (Target date: 08/29/15)   Baseline 08/15/15: baseline 11/30 today. 09/06/15: scored 14/30 today, 3 point improvement   Status Not Met   PT SHORT TERM GOAL #3   Title Pt will improve gait velocity from 1.79  ft/sec to > / = 2.09 ft/sec to indicate decreased risk of recurrent falls.   (Target date: 08/29/15)   Baseline 3/29: gait velocity = 2.33 ft/sec   Status Achieved   PT SHORT TERM GOAL #4   Title Pt will negotiate 2 stairs without rails with mod I using LRAD to indicate increased independence using primary home entrance.  (Target date: 08/29/15)   Baseline Met 3/29 using SPC.   Status Achieved   PT SHORT TERM GOAL #5   Title Pt will ambulate > 300' over level, indoor surfaces without AFO with no evidence of L ankle instability (excessive forefoot supination) to indicate increased safety with ambulation.   (Target date: 08/29/15)   Baseline Met 3/27.   Status Achieved   PT SHORT TERM GOAL #6   Title Pt will ambulate 50' over grass  surfaces with supervision using LRAD to increase pt safety attending grandson's soccer games.   (Target date: 08/29/15)   Baseline Met 3/27.   Status Achieved   PT SHORT TERM GOAL #7   Title Pt will perform floor transfer (supine on floor > standing) with min A without use of external aid to indicate increased safety with fall recovery.   (Target date: 08/29/15)   Baseline 3/29: attempted for the first time with use of external aid (mat table); however, pt performed final trial with (S).   Status Partially Met           PT Long Term Goals - 09/19/15 1518    PT LONG TERM GOAL #1   Title Pt will improve gait velocity from 1.79 ft/sec to > / = 2.39 ft/sec to indicate increased efficiency of ambulation.     (Modified Target date: 10/03/15)   Baseline 4/19: gait velocity = 2.27 ft/sec   Status Partially Met   PT LONG TERM GOAL #2   Title Pt will score > / = 19/30 on FGA to indicate decreased fall risk.   (Target date: 09/19/15)   Baseline Deferred; pt unlikely to meet tihs goal due to CVA-related gait impairments.    Status Deferred   PT LONG TERM GOAL #3   Title Pt will ambulate > 500' over unlevel, paved surfaces without AFO with mod I using LRAD with no L ankle instability to indicate safety with community mobility.  (Target date: 09/19/15)   Status Achieved   PT LONG TERM GOAL #4   Title Pt will ambulate 48' over unlevel, grass surfaces with mod I using LRAD to indicate increased independence attending grandson's soccer games.   (Modified Target date: 10/03/15)   Baseline 4/19: unable to assess due to rainy weather, wet grass today. Continue goal through renewed POC.   Continue goal through renewed POC.    Status On-going   PT LONG TERM GOAL #5   Title Pt will perform floor transfer (supine on floor > standing) with supervision without use of external aid to indicate increased safety with fall recovery.  (Modified Target date: 10/03/15)   Baseline 4/19: Pt required min A for floor transfer  without exgternal aid.   Continue goal through renewed POC.    Status Partially Met   Additional Long Term Goals   Additional Long Term Goals Yes   PT LONG TERM GOAL #6   Title Pt will negotiate 4 stairs without rails with mod I using LRAD to indicate increased access to community.   (Target date: 09/19/15)   Baseline Met 4/19.   Status Achieved   PT LONG TERM GOAL #  7   Title Pt will negotiate 4 stairs with single rail using reciprocal pattern with supervision to address pt-stated goal and to indicate progress toward PLOF.  (Target date: 10/03/15)   Status New               Plan - 09/24/15 1627    Clinical Impression Statement Pt tolerated session well and able to progress to supervision over objects of 4 and 8" heights with min cues and use of cane, LLE, RLE sequence.  Anticipate d/c next week as pt progressing well.   PT Next Visit Plan Address recently added LTG for stairs with reciprocal pattern.      Patient will benefit from skilled therapeutic intervention in order to improve the following deficits and impairments:     Visit Diagnosis: Unsteadiness on feet  Other abnormalities of gait and mobility  Spastic hemiplegia affecting left nondominant side [facility])     Problem List Patient Active Problem List   Diagnosis Date Noted  . Snoring 07/03/2015  . Persistent atrial fibrillation ([place]) 04/16/2015  . Hypokalemia 09/20/2014  . HTN (hypertension) 09/19/2014  . HLD (hyperlipidemia) 09/19/2014  . Diabetes mellitus without complication ([place]) 61/53/7943  . Jerking 09/19/2014  . Alterations of sensations, late effect of cerebrovascular disease 11/07/2013  . Disturbances of vision, late effect of cerebrovascular disease 11/07/2013  . Sinus bradycardia 08/08/2013  . 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[place]) 07/26/2013  . Left-sided neglect 07/26/2013  . Small vessel disease ([place]) 06/07/2013  . Obesity, unspecified 06/07/2013  . CVA (cerebral infarction)  06/07/2013  . large right middle cerebral artery infarct, embolic 27/61/4709  . Hypertension 06/03/2013  . Diabetes ([place]) 06/03/2013   Laureen Abrahams, PT, DPT 09/24/2015 4:29 PM  [facility] [address] [place], [place], [address] Phone: [phone]   Fax:  [phone]  Name: Barrie Wale MRN: [id number] Date of Birth: 04-21-1946

## 2015-09-24 NOTE — Therapy (Signed)
[facility] [address] [place], [place], [address] Phone: [phone]   Fax:  [phone]  Occupational Therapy Treatment  Patient Details  Name: Donald [name] MRN: [id number] Date of Birth: Feb 21, 1946 Referring Provider: Dr. Margette [name]  Encounter Date: 09/24/2015      OT End of Session - 09/24/15 1724    Visit Number 10   Number of Visits 17   Date for OT Re-Evaluation 10/03/15   Authorization Type Medicare   Authorization Time Period Progress note / gcode 10th visit   Authorization - Visit Number 10   Authorization - Number of Visits 10   OT Start Time [id number]   OT Stop Time 1529   OT Time Calculation (min) 42 min   Activity Tolerance Patient tolerated treatment well      Past Medical History  Diagnosis Date  . Hypertension   . Diabetes mellitus without complication ([place])   . Hyperlipemia   . large right middle cerebral artery infarct, embolic [id number]    a. s/p IV tPA, b. source unknown, c. loop recorder placed  . Snoring 07/03/2015    Past Surgical History  Procedure Laterality Date  . Tee without cardioversion N/A 06/07/2013    Procedure: TRANSESOPHAGEAL ECHOCARDIOGRAM (TEE);  Surgeon: Donald Spark, MD;  Location: [facility] ENDOSCOPY;  Service: Cardiovascular;  Laterality: N/A;  . Loop recorder implant  06/07/2013    MDT LinQ implanted by Dr Donald [name] for cryptogenic stroke  . Loop recorder implant N/A 06/07/2013    Procedure: LOOP RECORDER IMPLANT;  Surgeon: Donald Mark, MD;  Location: [facility] CATH LAB;  Service: Cardiovascular;  Laterality: N/A;    There were no vitals filed for this visit.      Subjective Assessment - 09/24/15 1452    Subjective  Its hard to use my hand to lift things overhead   Patient is accompained by: Family member  wife   Pertinent History Afib, seizure 09/2014,    Patient Stated Goals Play guitar    Currently in Pain? No/denies                      OT  Treatments/Exercises (OP) - 09/24/15 0001    Neurological Re-education Exercises   Other Exercises 1 Neuro re ed to address proximal strength in LUE for mid to overhead reach with resistance and weighted objects up to 2 pounds as wellin closed chain unilateral tasks in high sitting to activiate trunk.  Pt needs min - mod facilitation depending on task and demand.  Also addressed carrying water with left hand during functional ambulation - pt with multiple spills and poor control.                   OT Short Term Goals - 09/24/15 1722    OT SHORT TERM GOAL #1   Title Patient will complete home exercise program independently due 09/13/15   Status Achieved   OT SHORT TERM GOAL #2   Title Patient will demonstrate improved coordination in left hand as evidenced by decrease time on 9 hole peg test by 10 seconds   Status Achieved   OT SHORT TERM GOAL #3   Title Patient will demonstrate ability to support neck of guitar with left hand while seated, and demonstrate adequate finger control to form 1-2 cord positions with increased time   Status Achieved   OT SHORT TERM GOAL #4   Title Patient will adequately use bilateral hands to slice a soft  fruit or vegetable (tomatao) without incident with minimal environmental set up and cueing   Status Achieved   OT SHORT TERM GOAL #5   Title Patient will demonstrate adequate strength and control of left UE to lift an object weighing up to 3 lb weight into overhead shelf    Status Achieved           OT Long Term Goals - 2015/10/21 1723    OT LONG TERM GOAL #1   Title Patient will complete update home exercise / home activity program independently (due 10/03/15)   Status On-going   OT LONG TERM GOAL #2   Title Patient will demonstrate improved fine motor coordination in left UE as evidenced by 20 second reduction in time on 9 hole peg test   Status On-going   OT LONG TERM GOAL #3   Title Patient will adjust left hand position on neck of guitar for  3-4 chord changes in time with simple song.   Status On-going   OT LONG TERM GOAL #4   Title Patient will unload dishwasher lifting 2-3 plates at once (or comparable weight)  into overhead shelf    Status On-going   OT LONG TERM GOAL #5   Title Patient will carry beverage in left hand  while walking in community at least 150 feet,using cane in right hand in minimally crowded / congested environment    Status On-going               Plan - Oct 21, 2015 1723    Clinical Impression Statement Pt progressing slowly toward goals. Pt with improved use of LUE for overhead tasks during session today.   Rehab Potential Good   OT Frequency 2x / week   OT Duration 8 weeks   OT Treatment/Interventions Self-care/ADL training;Ultrasound;Therapeutic exercise;Neuromuscular education;Manual Therapy;Functional Mobility Training;DME and/or AE instruction;Splinting;Therapeutic exercises;Therapeutic activities;Balance training;Patient/family education   Plan NMR LUE and trunk   OT Home Exercise Plan Initiated 08/21/15, reviewed 08/22/15   Consulted and Agree with Plan of Care Patient;Family member/caregiver   Family Member Consulted wife Donald [name]      Patient will benefit from skilled therapeutic intervention in order to improve the following deficits and impairments:  Decreased activity tolerance, Decreased balance, Decreased cognition, Decreased coordination, Decreased range of motion, Decreased mobility, Decreased strength, Difficulty walking, Impaired perceived functional ability, Improper body mechanics, Impaired UE functional use, Impaired tone, Impaired sensation  Visit Diagnosis: Other disturbances of skin sensation  Unspecified lack of coordination  Unsteadiness on feet  Spastic hemiplegia affecting left nondominant side ([facility])  Other abnormalities of gait and mobility      G-Codes - 2015/10/21 1726    Functional Assessment Tool Used 9 HOLE PEG TEST, skilled clinical observation   Functional  Limitation Carrying, moving and handling objects   Carrying, Moving and Handling Objects Current Status [id number]) At least 40 percent but less than 60 percent impaired, limited or restricted   Carrying, Moving and Handling Objects Goal Status [id number]) At least 20 percent but less than 40 percent impaired, limited or restricted      Problem List Patient Active Problem List   Diagnosis Date Noted  . Snoring 07/03/2015  . Persistent atrial fibrillation ([place]) 04/16/2015  . Hypokalemia 09/20/2014  . HTN (hypertension) 09/19/2014  . HLD (hyperlipidemia) 09/19/2014  . Diabetes mellitus without complication ([place]) [id number]  . Jerking 09/19/2014  . Alterations of sensations, late effect of cerebrovascular disease 11/07/2013  . Disturbances of vision, late effect of cerebrovascular disease 11/07/2013  . Sinus  bradycardia 08/08/2013  . 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[place]) 07/26/2013  . Left-sided neglect 07/26/2013  . Small vessel disease ([place]) 06/07/2013  . Obesity, unspecified 06/07/2013  . CVA (cerebral infarction) 06/07/2013  . large right middle cerebral artery infarct, embolic [id number]  . Hypertension 06/03/2013  . Diabetes ([place]) 06/03/2013   Occupational Therapy Progress Note  Dates of Reporting Period: 08/09/2015 to 09/24/2015  Objective Reports of Subjective Statement: See above  Objective Measurements: See above  Goal Update: see above  Plan: See above  Reason Skilled Services are Required: See above  Quay Burow, OTR/L 09/24/2015, 5:28 PM  [facility] [address] [place], [place], [address] Phone: [phone]   Fax:  [phone]  Name: Donald [name] MRN: [id number] Date of Birth: 17-May-1946

## 2015-09-26 ENCOUNTER — Ambulatory Visit: Payer: Medicare Other | Admitting: Occupational Therapy

## 2015-09-26 ENCOUNTER — Encounter: Payer: Self-pay | Admitting: Occupational Therapy

## 2015-09-26 ENCOUNTER — Ambulatory Visit: Payer: Medicare Other | Admitting: Physical Therapy

## 2015-09-26 ENCOUNTER — Encounter: Payer: Self-pay | Admitting: Physical Therapy

## 2015-09-26 DIAGNOSIS — R279 Unspecified lack of coordination: Secondary | ICD-10-CM | POA: Diagnosis not present

## 2015-09-26 DIAGNOSIS — R208 Other disturbances of skin sensation: Secondary | ICD-10-CM

## 2015-09-26 DIAGNOSIS — I69998 Other sequelae following unspecified cerebrovascular disease: Secondary | ICD-10-CM

## 2015-09-26 DIAGNOSIS — R278 Other lack of coordination: Secondary | ICD-10-CM

## 2015-09-26 DIAGNOSIS — R209 Unspecified disturbances of skin sensation: Secondary | ICD-10-CM

## 2015-09-26 DIAGNOSIS — I69354 Hemiplegia and hemiparesis following cerebral infarction affecting left non-dominant side: Secondary | ICD-10-CM

## 2015-09-26 DIAGNOSIS — G8114 Spastic hemiplegia affecting left nondominant side: Secondary | ICD-10-CM

## 2015-09-26 DIAGNOSIS — R2689 Other abnormalities of gait and mobility: Secondary | ICD-10-CM | POA: Diagnosis not present

## 2015-09-26 DIAGNOSIS — R2681 Unsteadiness on feet: Secondary | ICD-10-CM | POA: Diagnosis not present

## 2015-09-26 NOTE — Therapy (Signed)
[facility] [address] [place], [place], [address] Phone: [phone]   Fax:  [phone]  Physical Therapy Treatment  Patient Details  Name: Donald [name] MRN: [id number] Date of Birth: 03-30-1946 Referring Provider: Ernestine Mcmurray, NP  Encounter Date: 09/26/2015      PT End of Session - 09/26/15 1540    Visit Number 13   Number of Visits 15  Requesting 4 additional sessions   Date for PT Re-Evaluation 10/19/15   Authorization Type Medicare primary; [employer] secondary - G Codes and PN required every 10 visits   PT Start Time 1533   PT Stop Time 1615   PT Time Calculation (min) 42 min   Equipment Utilized During Treatment Gait belt   Activity Tolerance Patient tolerated treatment well   Behavior During Therapy [facility] for tasks assessed/performed      Past Medical History  Diagnosis Date  . Hypertension   . Diabetes mellitus without complication ([place])   . Hyperlipemia   . large right middle cerebral artery infarct, embolic 07/09/7822    a. s/p IV tPA, b. source unknown, c. loop recorder placed  . Snoring 07/03/2015    Past Surgical History  Procedure Laterality Date  . Tee without cardioversion N/A 06/07/2013    Procedure: TRANSESOPHAGEAL ECHOCARDIOGRAM (TEE);  Surgeon: [name], MD;  Location: [facility] ENDOSCOPY;  Service: Cardiovascular;  Laterality: N/A;  . Loop recorder implant  06/07/2013    MDT LinQ implanted by Dr [name] for cryptogenic stroke  . Loop recorder implant N/A 06/07/2013    Procedure: LOOP RECORDER IMPLANT;  Surgeon: [name], MD;  Location: [facility] CATH LAB;  Service: Cardiovascular;  Laterality: N/A;    There were no vitals filed for this visit.      Subjective Assessment - 09/26/15 1539    Subjective No new issues. No pain or falls to report.   Patient is accompained by: Family member  wife, Donald [name]   Pertinent History Pt likes to be called "[name]".   PMH significant for:  CVA (large R MCA infarct, embolic),  seizures, HTN, HLD, DM, persistent A-Fib   Patient Stated Goals Work on walking downstairs, work on walking technique to make it better, and to work on my reaction time when I lose my balance to my left side.   Currently in Pain? No/denies            [facility] Adult PT Treatment/Exercise - 09/26/15 1542    Transfers   Transfers Sit to Stand;Stand to Sit   Ambulation/Gait   Ambulation/Gait Yes   Stairs Yes   Stairs Assistance 4: Min guard   Stairs Assistance Details (indicate cue type and reason) demo'd sequence/technique first, then pt demo'd via blocked practice with mod cues initally on sequencing, to advance hands along rails with stepping and for weight shifting. pt progressed to supervision level with minimal cues needed.                                     Stair Management Technique Two rails;One rail Right;Alternating pattern;Forwards   Number of Stairs 4  multiple reps   High Level Balance   High Level Balance Activities Negotiating over obstacles   High Level Balance Comments blocked practice with stepping over bolsters of vaired heights: pt unable to recall/demo sequencing from previous session. mod cues needed on correct sequecing initially with pt progressing towards no  cues needed. min assist initally progressing to supervision.                                                               Knee/Hip Exercises: Standing   Forward Step Up Left;1 set;10 reps;Hand Hold: 2;Step Height: 4";Limitations   Forward Step Up Limitations had pt use weaker leg to step up onto 4 inch box with bil support on parallel bars for strengthening and in prep for reciprocal negotiation of stairs.                            Step Down Left;1 set;10 reps;Hand Hold: 2;Step Height: 4";Limitations   Step Down Limitations with weaker leg (left) staying on step, pt stepped right leg down to ground and back up with bil UE support on parallel bars for strengthening and in prep  for reciprocal negotiaion of stairs                                  PT Short Term Goals - 09/06/15 1507    PT SHORT TERM GOAL #1   Title Pt will perform initial HEP with mod I using paper handout to indicate safe daily compliance with home program.   (Target date: 08/29/15)  Correction: Target date 08/29/15   Baseline Met 3/29.   Status Achieved   PT SHORT TERM GOAL #2   Title Complete FGA and improve score by 4 points to indicate improved dynamic gait stability.   (Target date: 08/29/15)   Baseline 08/15/15: baseline 11/30 today. 09/06/15: scored 14/30 today, 3 point improvement   Status Not Met   PT SHORT TERM GOAL #3   Title Pt will improve gait velocity from 1.79 ft/sec to > / = 2.09 ft/sec to indicate decreased risk of recurrent falls.   (Target date: 08/29/15)   Baseline 3/29: gait velocity = 2.33 ft/sec   Status Achieved   PT SHORT TERM GOAL #4   Title Pt will negotiate 2 stairs without rails with mod I using LRAD to indicate increased independence using primary home entrance.  (Target date: 08/29/15)   Baseline Met 3/29 using SPC.   Status Achieved   PT SHORT TERM GOAL #5   Title Pt will ambulate > 300' over level, indoor surfaces without AFO with no evidence of L ankle instability (excessive forefoot supination) to indicate increased safety with ambulation.   (Target date: 08/29/15)   Baseline Met 3/27.   Status Achieved   PT SHORT TERM GOAL #6   Title Pt will ambulate 50' over grass surfaces with supervision using LRAD to increase pt safety attending grandson's soccer games.   (Target date: 08/29/15)   Baseline Met 3/27.   Status Achieved   PT SHORT TERM GOAL #7   Title Pt will perform floor transfer (supine on floor > standing) with min A without use of external aid to indicate increased safety with fall recovery.   (Target date: 08/29/15)   Baseline 3/29: attempted for the first time with use of external aid (mat table); however, pt performed final trial with (S).   Status  Partially Met           PT  Long Term Goals - 09/19/15 1518    PT LONG TERM GOAL #1   Title Pt will improve gait velocity from 1.79 ft/sec to > / = 2.39 ft/sec to indicate increased efficiency of ambulation.     (Modified Target date: 10/03/15)   Baseline 4/19: gait velocity = 2.27 ft/sec   Status Partially Met   PT LONG TERM GOAL #2   Title Pt will score > / = 19/30 on FGA to indicate decreased fall risk.   (Target date: 09/19/15)   Baseline Deferred; pt unlikely to meet tihs goal due to CVA-related gait impairments.    Status Deferred   PT LONG TERM GOAL #3   Title Pt will ambulate > 500' over unlevel, paved surfaces without AFO with mod I using LRAD with no L ankle instability to indicate safety with community mobility.  (Target date: 09/19/15)   Status Achieved   PT LONG TERM GOAL #4   Title Pt will ambulate 28' over unlevel, grass surfaces with mod I using LRAD to indicate increased independence attending grandson's soccer games.   (Modified Target date: 10/03/15)   Baseline 4/19: unable to assess due to rainy weather, wet grass today. Continue goal through renewed POC.   Continue goal through renewed POC.    Status On-going   PT LONG TERM GOAL #5   Title Pt will perform floor transfer (supine on floor > standing) with supervision without use of external aid to indicate increased safety with fall recovery.  (Modified Target date: 10/03/15)   Baseline 4/19: Pt required min A for floor transfer without exgternal aid.   Continue goal through renewed POC.    Status Partially Met   Additional Long Term Goals   Additional Long Term Goals Yes   PT LONG TERM GOAL #6   Title Pt will negotiate 4 stairs without rails with mod I using LRAD to indicate increased access to community.   (Target date: 09/19/15)   Baseline Met 4/19.   Status Achieved   PT LONG TERM GOAL #7   Title Pt will negotiate 4 stairs with single rail using reciprocal pattern with supervision to address pt-stated goal and to  indicate progress toward PLOF.  (Target date: 10/03/15)   Status New           Plan - 09/26/15 1540    Clinical Impression Statement Pt unable to demonstrate session carry over of stepping over obstacles with cane, needed cues and additional practice this session. Session also began to address pt goal of stair negotiation using a reciprocal pattern with good within session carry over noted. Call placed to [facility] today due to recieving the md order for his brace. Order and facesheet faxed over to [facility] clinic and they are to call pt to schedule an appointment. Pt is making steady progress toward goals.                                                             Clinical Impairments Affecting Rehab Potential Time since CVA (> 2 years)   PT Frequency 2x / week   PT Duration 2 weeks   PT Treatment/Interventions ADLs/Self Care Home Management;DME Instruction;Gait training;Stair training;Functional mobility training;Therapeutic activities;Therapeutic exercise;Balance training;Orthotic Fit/Training;Patient/family education;Neuromuscular re-education;Vestibular   PT Next Visit Plan Continue to work toward LTGs, assess  LTGs next week.   PT Home Exercise Plan L hip flexor self-stretch   Consulted and Agree with Plan of Care Patient   Family Member Consulted wife, Donald [name]      Patient will benefit from skilled therapeutic intervention in order to improve the following deficits and impairments:  Abnormal gait, Decreased balance, Decreased coordination, Increased edema, Impaired flexibility, Impaired sensation, Impaired tone, Decreased strength  Visit Diagnosis: Unsteadiness on feet  Other abnormalities of gait and mobility  Spastic hemiplegia affecting left nondominant side (HCC)  Other disturbances of skin sensation  Unspecified lack of coordination     Problem List Patient Active Problem List   Diagnosis Date Noted  . Snoring 07/03/2015  . Persistent atrial fibrillation ([place])  04/16/2015  . Hypokalemia 09/20/2014  . HTN (hypertension) 09/19/2014  . HLD (hyperlipidemia) 09/19/2014  . Diabetes mellitus without complication ([place]) 29/52/8413  . Jerking 09/19/2014  . Alterations of sensations, late effect of cerebrovascular disease 11/07/2013  . Disturbances of vision, late effect of cerebrovascular disease 11/07/2013  . Sinus bradycardia 08/08/2013  . 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[place]) 07/26/2013  . Left-sided neglect 07/26/2013  . Small vessel disease ([place]) 06/07/2013  . Obesity, unspecified 06/07/2013  . CVA (cerebral infarction) 06/07/2013  . large right middle cerebral artery infarct, embolic 24/40/1027  . Hypertension 06/03/2013  . Diabetes ([place]) 06/03/2013    Willow Ora, PTA, [facility] [address] [place], [place] [address] [phone] 09/26/2015, 4:39 PM   Name: Kamen Hanken MRN: [id number] Date of Birth: 04-12-1946

## 2015-09-26 NOTE — Therapy (Signed)
[facility] [address] [place], [place], [address] Phone: [phone]   Fax:  [phone]  Occupational Therapy Treatment  Patient Details  Name: Donald [name] MRN: [id number] Date of Birth: 24-Feb-1946 Referring Provider: Dr. Margette Fast  Encounter Date: 09/26/2015      OT End of Session - 09/26/15 1606    Visit Number 11   Number of Visits 17   Date for OT Re-Evaluation 10/03/15   Authorization Type Medicare   Authorization Time Period Progress note / gcode 10th visit   Authorization - Visit Number 11   Authorization - Number of Visits 17   OT Start Time [id number]   OT Stop Time 1530   OT Time Calculation (min) 45 min   Activity Tolerance Patient tolerated treatment well   Behavior During Therapy [facility] for tasks assessed/performed      Past Medical History  Diagnosis Date  . Hypertension   . Diabetes mellitus without complication ([place])   . Hyperlipemia   . large right middle cerebral artery infarct, embolic [id number]    a. s/p IV tPA, b. source unknown, c. loop recorder placed  . Snoring 07/03/2015    Past Surgical History  Procedure Laterality Date  . Tee without cardioversion N/A 06/07/2013    Procedure: TRANSESOPHAGEAL ECHOCARDIOGRAM (TEE);  Surgeon: [name], MD;  Location: [facility] ENDOSCOPY;  Service: Cardiovascular;  Laterality: N/A;  . Loop recorder implant  06/07/2013    MDT LinQ implanted by Dr [name] for cryptogenic stroke  . Loop recorder implant N/A 06/07/2013    Procedure: LOOP RECORDER IMPLANT;  Surgeon: [name], MD;  Location: [facility] CATH LAB;  Service: Cardiovascular;  Laterality: N/A;    There were no vitals filed for this visit.      Subjective Assessment - 09/26/15 1600    Subjective  My shoulder is getting better   Patient is accompained by: Family member   Pertinent History Afib, seizure 09/2014,    Currently in Pain? No/denies   Multiple Pain Sites No                       OT Treatments/Exercises (OP) - 09/26/15 0001    ADLs   Leisure Patient plans to purchase "gorilla fingers" to ease sensory feedback into left hand when playing guitar.     Neurological Re-education Exercises   Other Exercises 1 Neuromuscular reeducation to address functional use of left upper extremity in coordination with rest of body during functional mobility.  Patient with decreased postural control in standing and stepping, and exagerated with attempts to use lfet hand.  Patient needs mod facilitation to stand with body over (not behind) base of support.  Patient needs facilitation to activate core musculature for active aligned trunk for optimal UE functioning.  Patient needs frequent cues to accept weight onto left leg in standing - especially when approaching objects with left hand.     Other Exercises 2 Worked to increased scapular stability - sustained muscle activation in upright postures.                 OT Education - 09/26/15 1606    Education provided Yes   Education Details postural control for optimal UE use   Person(s) Educated Patient;Spouse   Methods Explanation;Demonstration   Comprehension Verbal cues required;Tactile cues required;Need further instruction          OT Short Term Goals - 09/24/15 1722    OT SHORT TERM GOAL #  1   Title Patient will complete home exercise program independently due 09/13/15   Status Achieved   OT SHORT TERM GOAL #2   Title Patient will demonstrate improved coordination in left hand as evidenced by decrease time on 9 hole peg test by 10 seconds   Status Achieved   OT SHORT TERM GOAL #3   Title Patient will demonstrate ability to support neck of guitar with left hand while seated, and demonstrate adequate finger control to form 1-2 cord positions with increased time   Status Achieved   OT SHORT TERM GOAL #4   Title Patient will adequately use bilateral hands to slice a soft fruit or vegetable  (tomatao) without incident with minimal environmental set up and cueing   Status Achieved   OT SHORT TERM GOAL #5   Title Patient will demonstrate adequate strength and control of left UE to lift an object weighing up to 3 lb weight into overhead shelf    Status Achieved           OT Long Term Goals - 09/24/15 1723    OT LONG TERM GOAL #1   Title Patient will complete update home exercise / home activity program independently (due 10/03/15)   Status On-going   OT LONG TERM GOAL #2   Title Patient will demonstrate improved fine motor coordination in left UE as evidenced by 20 second reduction in time on 9 hole peg test   Status On-going   OT LONG TERM GOAL #3   Title Patient will adjust left hand position on neck of guitar for 3-4 chord changes in time with simple song.   Status On-going   OT LONG TERM GOAL #4   Title Patient will unload dishwasher lifting 2-3 plates at once (or comparable weight)  into overhead shelf    Status On-going   OT LONG TERM GOAL #5   Title Patient will carry beverage in left hand  while walking in community at least 150 feet,using cane in right hand in minimally crowded / congested environment    Status On-going               Plan - 09/26/15 1607    Clinical Impression Statement Patient progressing toward OT long term goals due to improved active movement / stability in end range of shoulder motion.     Rehab Potential Good   OT Frequency 2x / week   OT Duration 8 weeks   OT Treatment/Interventions Self-care/ADL training;Ultrasound;Therapeutic exercise;Neuromuscular education;Manual Therapy;Functional Mobility Training;DME and/or AE instruction;Splinting;Therapeutic exercises;Therapeutic activities;Balance training;Patient/family education   Plan NMR LUE / trunk   OT Home Exercise Plan Initiated 08/21/15, reviewed 08/22/15   Consulted and Agree with Plan of Care Patient;Family member/caregiver   Family Member Consulted wife [name]      Patient  will benefit from skilled therapeutic intervention in order to improve the following deficits and impairments:  Decreased activity tolerance, Decreased balance, Decreased cognition, Decreased coordination, Decreased range of motion, Decreased mobility, Decreased strength, Difficulty walking, Impaired perceived functional ability, Improper body mechanics, Impaired UE functional use, Impaired tone, Impaired sensation  Visit Diagnosis: Unsteadiness on feet  Other lack of coordination  Sensation alteration, late effect of cerebrovascular disease  Hemiplegia and hemiparesis following cerebral infarction affecting left non-dominant side [facility])    Problem List Patient Active Problem List   Diagnosis Date Noted  . Snoring 07/03/2015  . Persistent atrial fibrillation ([place]) 04/16/2015  . Hypokalemia 09/20/2014  . HTN (hypertension) 09/19/2014  . HLD (hyperlipidemia) 09/19/2014  .  Diabetes mellitus without complication ([place]) [id number]  . Jerking 09/19/2014  . Alterations of sensations, late effect of cerebrovascular disease 11/07/2013  . Disturbances of vision, late effect of cerebrovascular disease 11/07/2013  . Sinus bradycardia 08/08/2013  . 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[place]) 07/26/2013  . Left-sided neglect 07/26/2013  . Small vessel disease ([place]) 06/07/2013  . Obesity, unspecified 06/07/2013  . CVA (cerebral infarction) 06/07/2013  . large right middle cerebral artery infarct, embolic [id number]  . Hypertension 06/03/2013  . Diabetes ([place]) 06/03/2013    Mariah Milling, OTR/L 09/26/2015, 4:10 PM  [facility] [address] [place], [place], [address] Phone: [phone]   Fax:  [phone]  Name: Donald [name] MRN: [id number] Date of Birth: Aug 12, 1945

## 2015-09-27 DIAGNOSIS — E119 Type 2 diabetes mellitus without complications: Secondary | ICD-10-CM | POA: Diagnosis not present

## 2015-09-27 DIAGNOSIS — Z125 Encounter for screening for malignant neoplasm of prostate: Secondary | ICD-10-CM | POA: Diagnosis not present

## 2015-09-27 DIAGNOSIS — I4891 Unspecified atrial fibrillation: Secondary | ICD-10-CM | POA: Diagnosis not present

## 2015-09-27 DIAGNOSIS — Z1211 Encounter for screening for malignant neoplasm of colon: Secondary | ICD-10-CM | POA: Diagnosis not present

## 2015-09-27 DIAGNOSIS — I639 Cerebral infarction, unspecified: Secondary | ICD-10-CM | POA: Diagnosis not present

## 2015-09-27 DIAGNOSIS — Z7984 Long term (current) use of oral hypoglycemic drugs: Secondary | ICD-10-CM | POA: Diagnosis not present

## 2015-09-27 DIAGNOSIS — Z0000 Encounter for general adult medical examination without abnormal findings: Secondary | ICD-10-CM | POA: Diagnosis not present

## 2015-09-27 DIAGNOSIS — I10 Essential (primary) hypertension: Secondary | ICD-10-CM | POA: Diagnosis not present

## 2015-09-27 DIAGNOSIS — E559 Vitamin D deficiency, unspecified: Secondary | ICD-10-CM | POA: Diagnosis not present

## 2015-09-27 DIAGNOSIS — K5900 Constipation, unspecified: Secondary | ICD-10-CM | POA: Diagnosis not present

## 2015-09-30 IMAGING — CT CT HEAD W/O CM
1 series · 16 of 30 positions shown, 20 images · non-contrast
Comparison: None.

CLINICAL DATA: Left-sided weakness and slurred speech. Code stroke.

EXAM:
CT HEAD WITHOUT CONTRAST
TECHNIQUE: Contiguous axial images were obtained from the base of the skull
through the vertex without intravenous contrast.

[Series 2: head 5.0 h30s · axial · 0.44mm/px · z∈[-73,+87]mm · 16 of 36 slices shown, 20 images]
[im 2/36  brain]
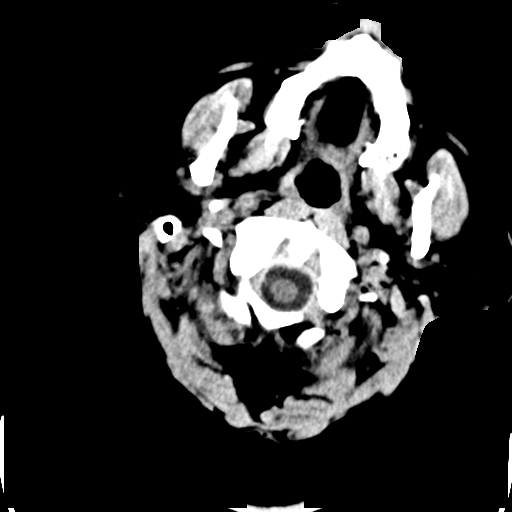
[im 2/36  bone]
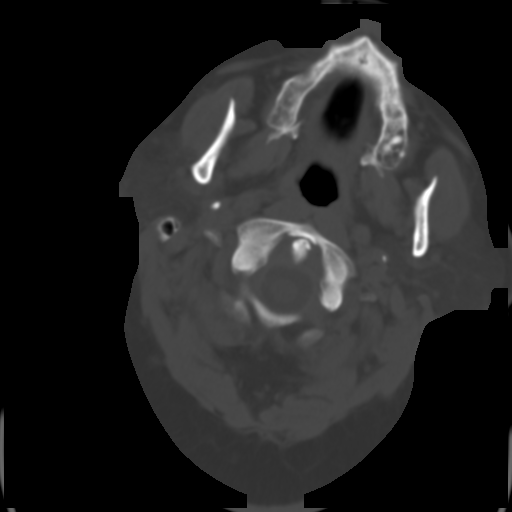
[im 4/36  brain]
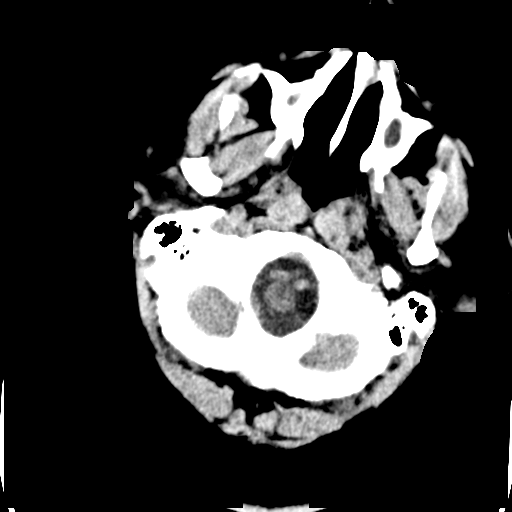
[im 7/36  brain]
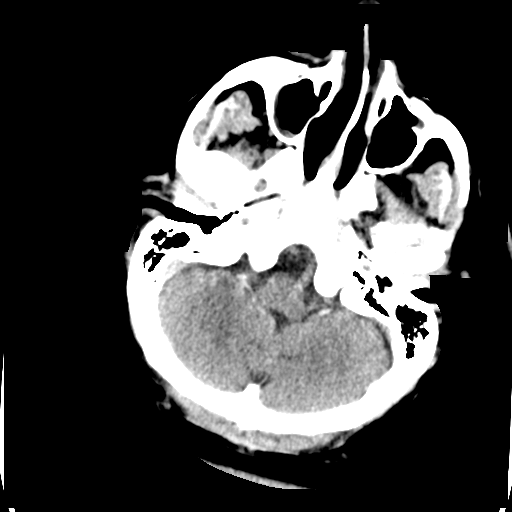
[im 9/36  brain]
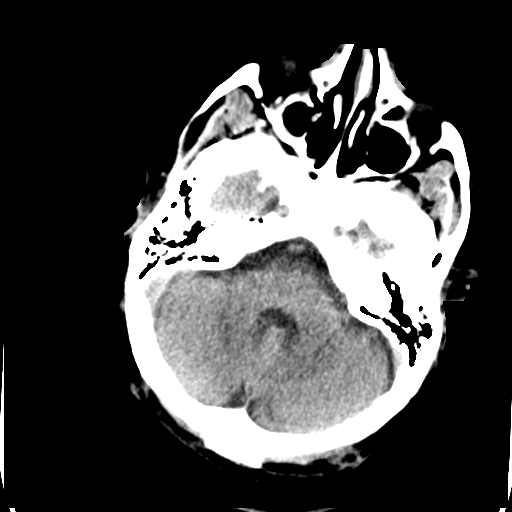
[im 10/36  brain]
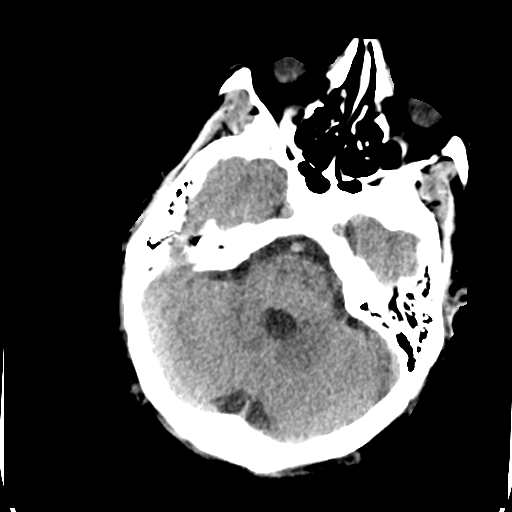
[im 10/36  bone]
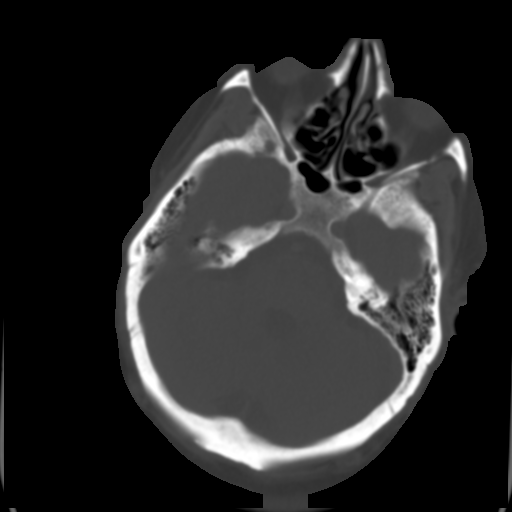
[im 13/36  brain]
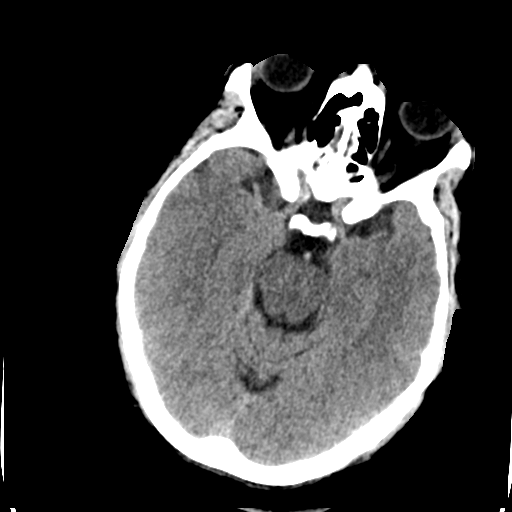
[im 15/36  brain]
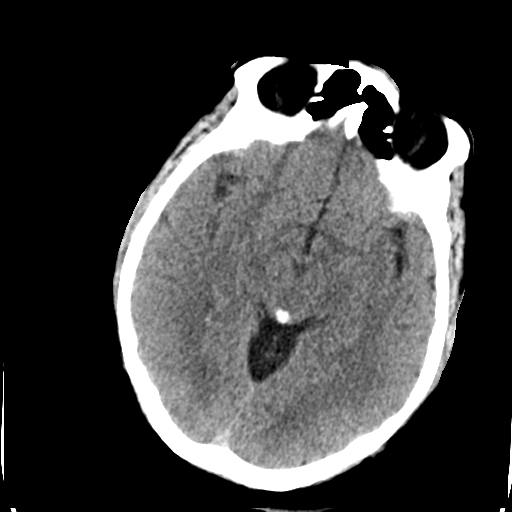
[im 17/36  brain]
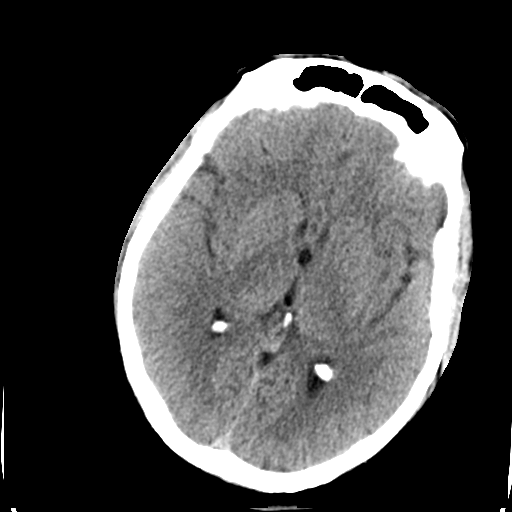
[im 19/36  brain]
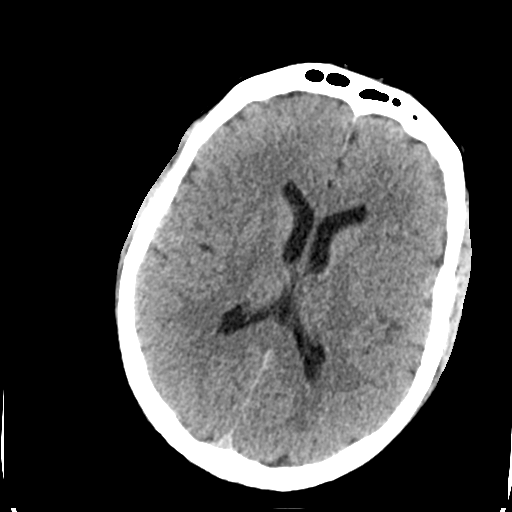
[im 19/36  bone]
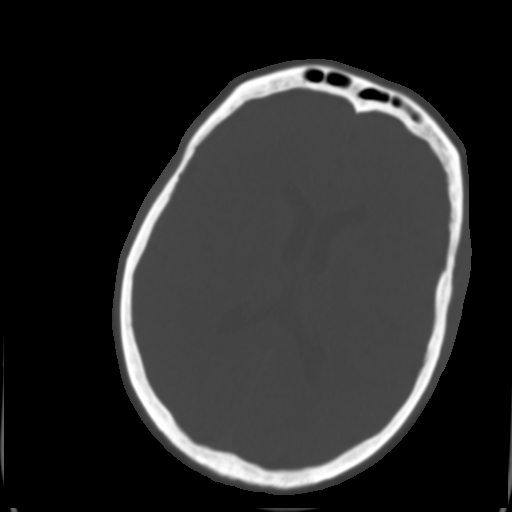
[im 21/36  brain]
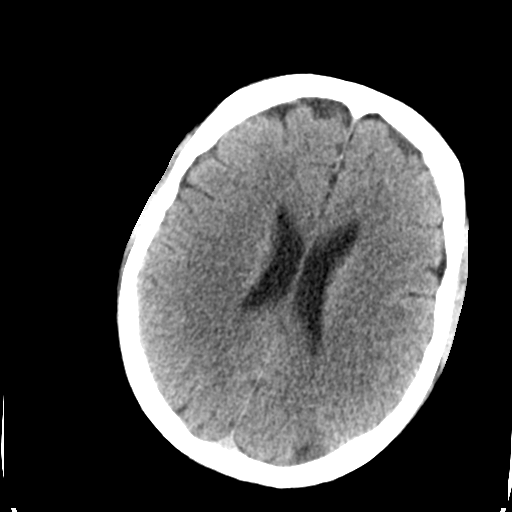
[im 23/36  brain]
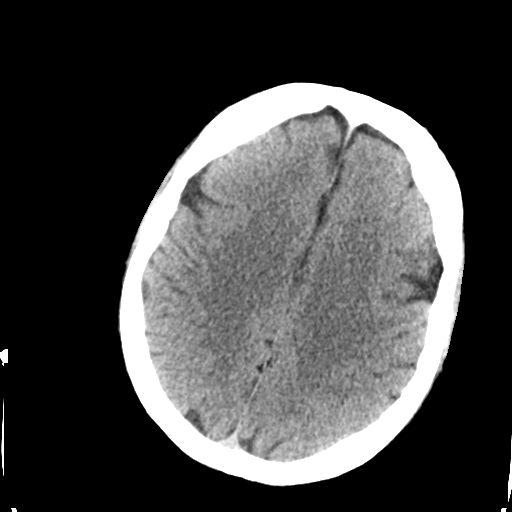
[im 26/36  brain]
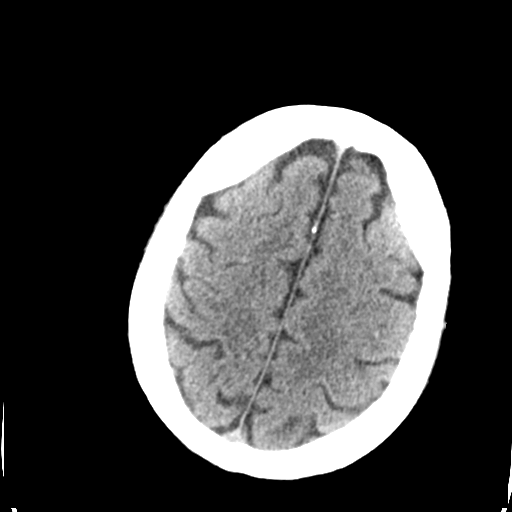
[im 27/36  brain]
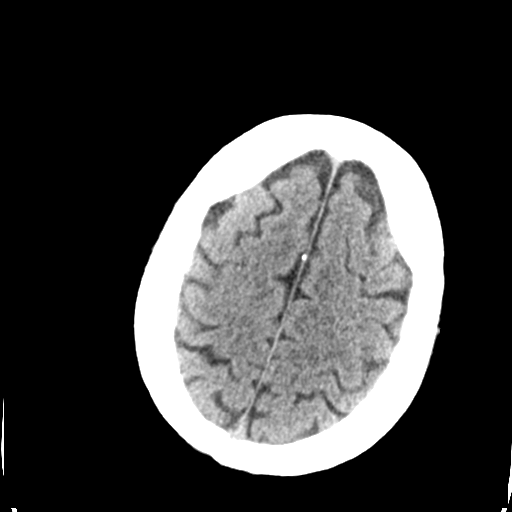
[im 27/36  bone]
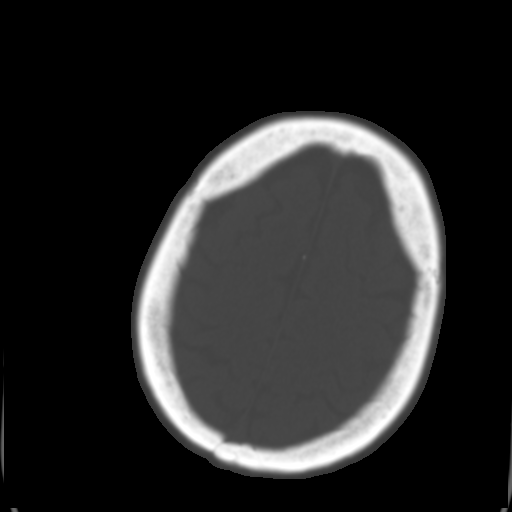
[im 29/36  brain]
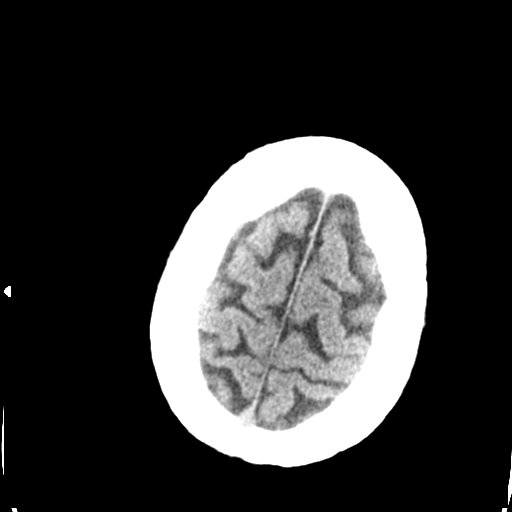
[im 32/36  brain]
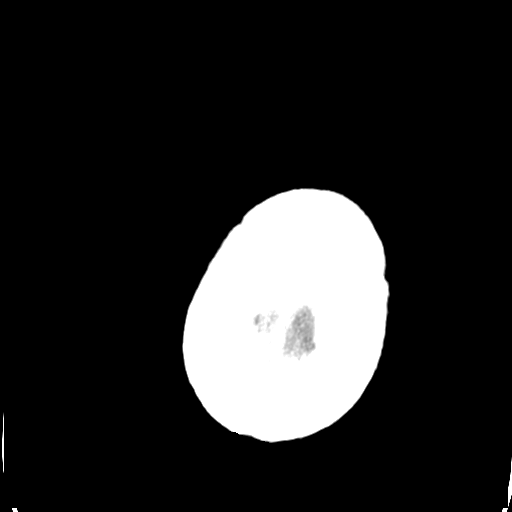
[im 34/36  brain]
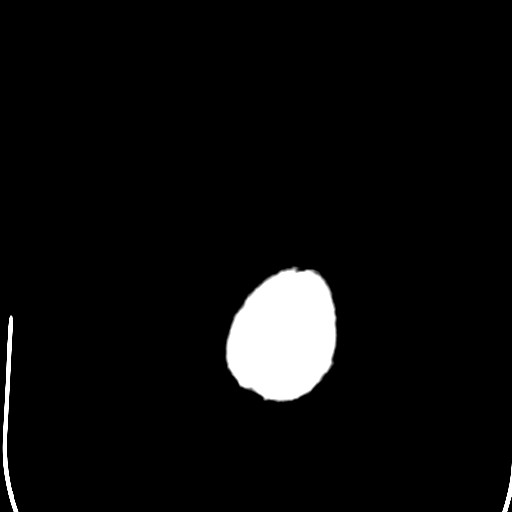

[16 of 30 positions shown; findings below may reference images not displayed]

FINDINGS: Ventricles and sulci appear symmetrical. No mass effect or midline
shift. No abnormal extra-axial fluid collections. Gray-white matter
junctions are distinct. Basal cisterns are not effaced. No evidence
of acute intracranial hemorrhage. No depressed skull fractures.
Mucosal thickening in the maxillary antra. Mastoid air cells are not
opacified.
IMPRESSION: No acute intracranial abnormalities.

Code stroke results were telephoned to Dr. C Ako at 3236 hr on
06/03/2013

## 2015-09-30 IMAGING — CT CT ANGIO HEAD
1 of 9 series · 6 of 33 positions shown · IV contrast (CONTRAST)
Comparison: None.

EXAM:
CT ANGIOGRAPHY HEAD AND NECK
TECHNIQUE: Multidetector CT imaging of the head and neck was performed using
the standard protocol during bolus administration of intravenous
contrast. Multiplanar CT image reconstructions including MIPs were
obtained to evaluate the vascular anatomy. Carotid stenosis
measurements (when applicable) are obtained utilizing NASCET
criteria, using the distal internal carotid diameter as the
denominator.

CONTRAST:  100mL OMNIPAQUE IOHEXOL 350 MG/ML SOLN

[mpr, ax. 1x1, axial · axial · 0.48mm/px · z∈[+67,+335]mm · 6 of 376 slices shown]
[im 54/376  soft-tissue]
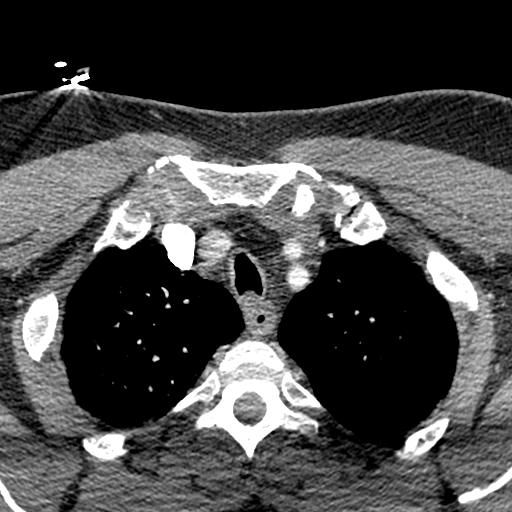
[im 108/376  bone]
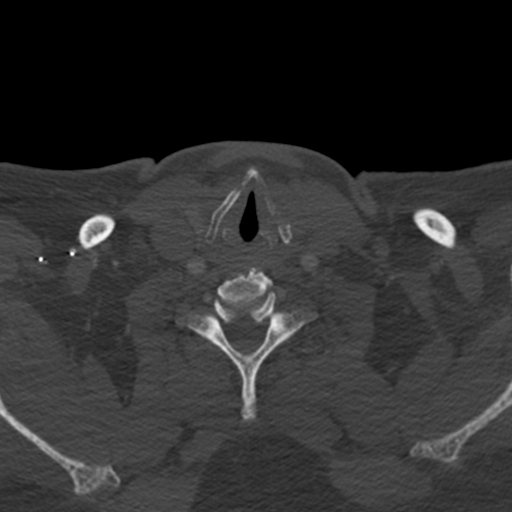
[im 161/376  soft-tissue]
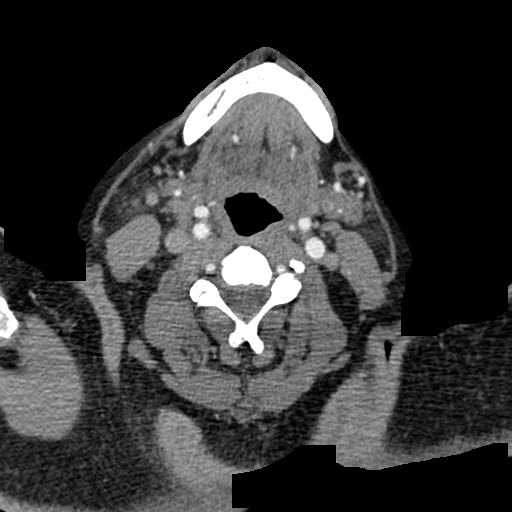
[im 215/376  bone]
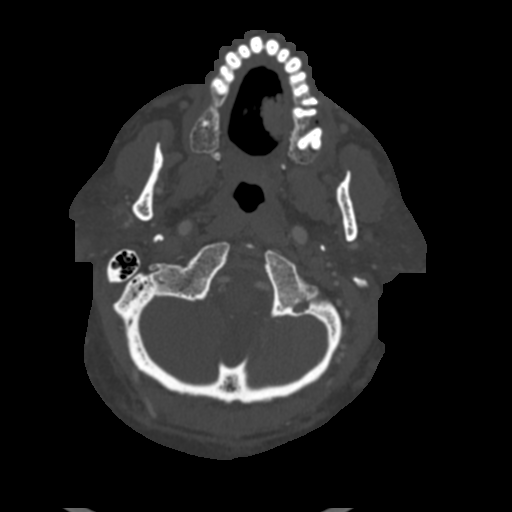
[im 268/376  soft-tissue]
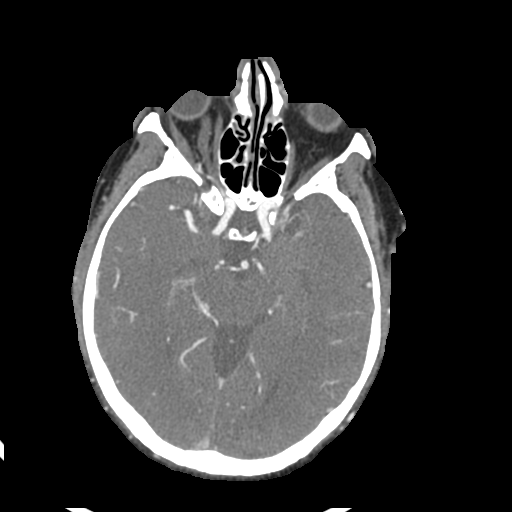
[im 322/376  bone]
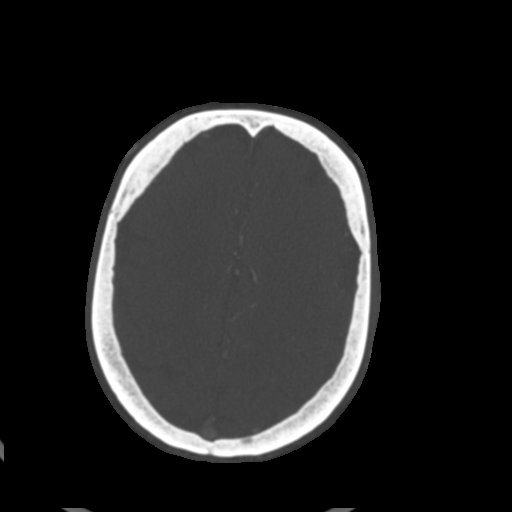

[6 of 33 positions shown; findings below may reference images not displayed]

FINDINGS: CTA HEAD FINDINGS

Without petrous, cavernous, and supra clinoid segments of the
internal carotid arteries are well opacified with widely patent
antegrade flow. No high-grade flow-limiting stenosis within the
internal carotid arteries. A1 segments are of equal caliber
bilaterally and are widely patent. Anterior communicating artery is
unremarkable. The anterior cerebral arteries are well opacified. The
middle cerebral arteries are well opacified bilaterally without
evidence of high-grade stenosis or occlusion. There is subtle
attenuation of the distal right MCA branches as compared to the
left, best appreciated on sagittal MIPS reconstructions.

No aneurysm seen within the anterior circulation.

Intracranial portions of the vertebral arteries are widely patent in
well opacified bilaterally. Posterior inferior cerebral arteries are
within normal limits. Fenestration of the proximal basilar artery
noted. No basilar tip stenosis or basilar tip aneurysm. Posterior
cerebral arteries are well opacified bilaterally without high-grade
stenosis or occlusion. Superior cerebellar arteries, and anterior
inferior cerebral arteries are grossly normal.

No aneurysm identified within the posterior circulation.

Review of the MIP images confirms the above findings.

CTA NECK FINDINGS

Visualized aortic arch is within normal limits with normal 3 vessel
morphology. A few scattered calcified plaques seen within the aortic
arch and at the origin of the left common carotid artery. No
high-grade stenosis seen at the origin of the great vessels.

The visualized subclavian arteries are within normal limits.

The common carotid arteries are widely patent and symmetric in
caliber without evidence of high-grade stenosis. Minimal
atherosclerotic plaque noted about the right carotid bifurcation.
The internal carotid arteries are well opacified bilaterally through
their distal cervical segments without high-grade stenosis,
dissection, or other abnormality.

The external carotid arteries and their branch vessels are within
normal limits.

The vertebral arteries appear codominant. The proximal left
vertebral artery is tortuous. Vertebral arteries are widely patent
without evidence of occlusion or high-grade stenosis.

Review of the MIP images confirms the above findings.

Visualized soft tissues of the neck are within normal limits without
evidence of adenopathy, mass lesion, or loculated fluid collection.
Visualized salivary glands are normal. The thyroid is within normal
limits.

Visualized superior mediastinum is within normal limits.

Dependent atelectasis noted within the partially visualized lungs.
No acute osseous abnormality.
IMPRESSION: CTA HEAD:

1. Subtle attenuation of the distal right superior MCA territory
branch vessels as compared to the left. No proximal branch occlusion
or high-grade flow-limiting stenosis identified within the right
middle cerebral artery proximally.
2. Otherwise unremarkable CTA of the head without evidence of
occlusion, high-grade stenosis, or aneurysm elsewhere within the
brain.
CTA NECK:

1. Normal CTA of the neck without evidence of high-grade stenosis,
occlusion, or dissection.
2. Fenestration of the proximal basilar artery

## 2015-10-01 ENCOUNTER — Encounter: Payer: Self-pay | Admitting: Physical Therapy

## 2015-10-01 ENCOUNTER — Ambulatory Visit: Payer: Medicare Other | Attending: Neurology | Admitting: Physical Therapy

## 2015-10-01 ENCOUNTER — Ambulatory Visit: Payer: Medicare Other | Admitting: Occupational Therapy

## 2015-10-01 DIAGNOSIS — G8114 Spastic hemiplegia affecting left nondominant side: Secondary | ICD-10-CM

## 2015-10-01 DIAGNOSIS — R208 Other disturbances of skin sensation: Secondary | ICD-10-CM | POA: Diagnosis not present

## 2015-10-01 DIAGNOSIS — R2681 Unsteadiness on feet: Secondary | ICD-10-CM

## 2015-10-01 DIAGNOSIS — I69354 Hemiplegia and hemiparesis following cerebral infarction affecting left non-dominant side: Secondary | ICD-10-CM | POA: Diagnosis not present

## 2015-10-01 DIAGNOSIS — R2689 Other abnormalities of gait and mobility: Secondary | ICD-10-CM | POA: Diagnosis not present

## 2015-10-01 DIAGNOSIS — R278 Other lack of coordination: Secondary | ICD-10-CM

## 2015-10-01 NOTE — Therapy (Addendum)
[facility] [address] [place], [place], [address] Phone: [phone]   Fax:  [phone]  Physical Therapy Treatment  Patient Details  Name: Donald [name] MRN: [id number] Date of Birth: 1946/01/29 Referring Provider: Ernestine Mcmurray, NP  Encounter Date: 10/01/2015      PT End of Session - 10/01/15 1413    Visit Number 14   Number of Visits 15  Requesting 4 additional sessions   Date for PT Re-Evaluation 10/19/15   Authorization Type Medicare primary; [employer] secondary - G Codes and PN required every 10 visits   PT Start Time 1404   PT Stop Time 1444   PT Time Calculation (min) 40 min   Equipment Utilized During Treatment Gait belt   Activity Tolerance Patient tolerated treatment well   Behavior During Therapy [facility] for tasks assessed/performed      Past Medical History  Diagnosis Date  . Hypertension   . Diabetes mellitus without complication ([place])   . Hyperlipemia   . large right middle cerebral artery infarct, embolic 12/05/2829    a. s/p IV tPA, b. source unknown, c. loop recorder placed  . Snoring 07/03/2015    Past Surgical History  Procedure Laterality Date  . Tee without cardioversion N/A 06/07/2013    Procedure: TRANSESOPHAGEAL ECHOCARDIOGRAM (TEE);  Surgeon: [name], MD;  Location: [facility] ENDOSCOPY;  Service: Cardiovascular;  Laterality: N/A;  . Loop recorder implant  06/07/2013    MDT LinQ implanted by Dr [name] for cryptogenic stroke  . Loop recorder implant N/A 06/07/2013    Procedure: LOOP RECORDER IMPLANT;  Surgeon: [name], MD;  Location: [facility] CATH LAB;  Service: Cardiovascular;  Laterality: N/A;    There were no vitals filed for this visit.      Subjective Assessment - 10/01/15 1406    Subjective [employer] called, brace has been ordered and just waiting for it to come in. Had 1 fall happen on Thurday at primary Md office. Had to get up on exam table and when getting off the  stool under his foot moved. He threw himself against wall and slide down to floor. Was able to get up with some assistance from Md. No injuries from fall.                                        Patient is accompained by: Family member  wife, [name]   Pertinent History Pt likes to be called "[name]".   PMH significant for: CVA (large R MCA infarct, embolic),  seizures, HTN, HLD, DM, persistent A-Fib   Patient Stated Goals Work on walking downstairs, work on walking technique to make it better, and to work on my reaction time when I lose my balance to my left side.   Currently in Pain? No/denies   Multiple Pain Sites No            [facility] Adult PT Treatment/Exercise - 10/01/15 1415    Transfers   Transfers Sit to Stand;Stand to Sit   Sit to Stand 6: Modified independent (Device/Increase time)   Stand to Sit 6: Modified independent (Device/Increase time)   Ambulation/Gait   Ambulation/Gait Yes   Ambulation/Gait Assistance 6: Modified independent (Device/Increase time);5: Supervision  supervision on outdoor complaint surfaces   Ambulation/Gait Assistance Details all gait trials with cane and left PLS AFO. Mod I with level indoor surfaces. supervision with compliant  outdoor surfaces, no balance issues noted on gravel, grass or uneven paved surfaces.                         Ambulation Distance (Feet) 500 Feet  x1 indoor/outdoor (~200 ft) combined, 200 x 2 indoor   Assistive device Straight cane  left PLS AFO (walk on brace)   Gait Pattern Step-through pattern;Decreased arm swing - left;Decreased hip/knee flexion - left;Decreased weight shift to left;Abducted - left;Narrow base of support   Ambulation Surface Level;Unlevel;Indoor;Outdoor;Paved;Gravel;Grass   Stairs Yes   Stairs Assistance 5: Supervision   Stairs Assistance Details (indicate cue type and reason) occasonal cues on sequencing and to advance hands along rails/weight shift   Stair Management Technique Two rails;Alternating  pattern;Forwards  1 rail with right UE suppport each way   Number of Stairs 4  x 3 reps   Ramp 6: Modified independent (Device)  with cane/left PLS AFO   Curb 6: Modified independent (Device/increase time)  with cane/ left PLS AFO   Pre-Gait Activities pt able to sefl don left PLS AFO in session prior to walking             PT Short Term Goals - 09/06/15 1507    PT SHORT TERM GOAL #1   Title Pt will perform initial HEP with mod I using paper handout to indicate safe daily compliance with home program.   (Target date: 08/29/15)  Correction: Target date 08/29/15   Baseline Met 3/29.   Status Achieved   PT SHORT TERM GOAL #2   Title Complete FGA and improve score by 4 points to indicate improved dynamic gait stability.   (Target date: 08/29/15)   Baseline 08/15/15: baseline 11/30 today. 09/06/15: scored 14/30 today, 3 point improvement   Status Not Met   PT SHORT TERM GOAL #3   Title Pt will improve gait velocity from 1.79 ft/sec to > / = 2.09 ft/sec to indicate decreased risk of recurrent falls.   (Target date: 08/29/15)   Baseline 3/29: gait velocity = 2.33 ft/sec   Status Achieved   PT SHORT TERM GOAL #4   Title Pt will negotiate 2 stairs without rails with mod I using LRAD to indicate increased independence using primary home entrance.  (Target date: 08/29/15)   Baseline Met 3/29 using SPC.   Status Achieved   PT SHORT TERM GOAL #5   Title Pt will ambulate > 300' over level, indoor surfaces without AFO with no evidence of L ankle instability (excessive forefoot supination) to indicate increased safety with ambulation.   (Target date: 08/29/15)   Baseline Met 3/27.   Status Achieved   PT SHORT TERM GOAL #6   Title Pt will ambulate 50' over grass surfaces with supervision using LRAD to increase pt safety attending grandson's soccer games.   (Target date: 08/29/15)   Baseline Met 3/27.   Status Achieved   PT SHORT TERM GOAL #7   Title Pt will perform floor transfer (supine on floor >  standing) with min A without use of external aid to indicate increased safety with fall recovery.   (Target date: 08/29/15)   Baseline 3/29: attempted for the first time with use of external aid (mat table); however, pt performed final trial with (S).   Status Partially Met           PT Long Term Goals - 10/01/15 1413    PT LONG TERM GOAL #1   Title Pt will improve gait  velocity from 1.79 ft/sec to > / = 2.39 ft/sec to indicate increased efficiency of ambulation.     (Modified Target date: 10/03/15)   Baseline 4/19: gait velocity = 2.27 ft/sec   Status Partially Met   PT LONG TERM GOAL #2   Title Pt will score > / = 19/30 on FGA to indicate decreased fall risk.   (Target date: 09/19/15)   Baseline Deferred; pt unlikely to meet tihs goal due to CVA-related gait impairments.    Status Deferred   PT LONG TERM GOAL #3   Title Pt will ambulate > 500' over unlevel, paved surfaces without AFO with mod I using LRAD with no L ankle instability to indicate safety with community mobility.  (Target date: 09/19/15)   Status Achieved   PT LONG TERM GOAL #4   Title Pt will ambulate 67' over unlevel, grass surfaces with mod I using LRAD to indicate increased independence attending grandson's soccer games.   (Modified Target date: 10/03/15)   Baseline 10/01/15:~ 250 feet with supervision on grass, gravel, unlevel paved surfaces with cane/brace, no balance loss noted  Continue goal through renewed POC.    Status Partially Met   PT LONG TERM GOAL #5   Title Pt will perform floor transfer (supine on floor > standing) with supervision without use of external aid to indicate increased safety with fall recovery.  (Modified Target date: 10/03/15)   Baseline 4/19: Pt required min A for floor transfer without exgternal aid.   Continue goal through renewed POC.    Status Partially Met   PT LONG TERM GOAL #6   Title Pt will negotiate 4 stairs without rails with mod I using LRAD to indicate increased access to community.    (Target date: 09/19/15)   Baseline Met 4/19.   Status Achieved   PT LONG TERM GOAL #7   Title Pt will negotiate 4 stairs with single rail using reciprocal pattern with supervision to address pt-stated goal and to indicate progress toward PLOF.  (Target date: 10/03/15)   Baseline 10/01/15: can negotiate 4 stairs with single rail, as long as he uses his right UE for support, unable to do with just left UE support   Status Achieved           Plan - 10/01/15 1413    Clinical Impression Statement Pt has met most LTGs, has 3 goals that are partially met which he is progressing towards. No issues with wear and use of left PLS AFO today in session.    Clinical Impairments Affecting Rehab Potential Time since CVA (> 2 years)   PT Frequency 2x / week   PT Duration 2 weeks   PT Treatment/Interventions ADLs/Self Care Home Management;DME Instruction;Gait training;Stair training;Functional mobility training;Therapeutic activities;Therapeutic exercise;Balance training;Orthotic Fit/Training;Patient/family education;Neuromuscular re-education;Vestibular   PT Next Visit Plan assess remaining LTGs.   PT Home Exercise Plan L hip flexor self-stretch   Consulted and Agree with Plan of Care Patient   Family Member Consulted wife, [name]      Patient will benefit from skilled therapeutic intervention in order to improve the following deficits and impairments:  Abnormal gait, Decreased balance, Decreased coordination, Increased edema, Impaired flexibility, Impaired sensation, Impaired tone, Decreased strength  Visit Diagnosis: Unsteadiness on feet  Other lack of coordination  Other abnormalities of gait and mobility  Spastic hemiplegia affecting left nondominant side (HCC)  Other disturbances of skin sensation     Problem List Patient Active Problem List   Diagnosis Date Noted  . Snoring  07/03/2015  . Persistent atrial fibrillation ([place]) 04/16/2015  . Hypokalemia 09/20/2014  . HTN (hypertension)  09/19/2014  . HLD (hyperlipidemia) 09/19/2014  . Diabetes mellitus without complication ([place]) 82/51/8984  . Jerking 09/19/2014  . Alterations of sensations, late effect of cerebrovascular disease 11/07/2013  . Disturbances of vision, late effect of cerebrovascular disease 11/07/2013  . Sinus bradycardia 08/08/2013  . 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[place]) 07/26/2013  . Left-sided neglect 07/26/2013  . Small vessel disease ([place]) 06/07/2013  . Obesity, unspecified 06/07/2013  . CVA (cerebral infarction) 06/07/2013  . large right middle cerebral artery infarct, embolic 21/08/1279  . Hypertension 06/03/2013  . Diabetes ([place]) 06/03/2013    Willow Ora, PTA, [facility] [address], [place], [place] [address] [phone] 10/01/2015, 2:54 PM   Name: Donald [name] MRN: [id number] Date of Birth: Jun 18, 1945

## 2015-10-02 ENCOUNTER — Ambulatory Visit: Payer: Medicare Other | Admitting: Physical Therapy

## 2015-10-02 ENCOUNTER — Ambulatory Visit (INDEPENDENT_AMBULATORY_CARE_PROVIDER_SITE_OTHER): Payer: Medicare Other | Admitting: *Deleted

## 2015-10-02 ENCOUNTER — Encounter: Payer: Self-pay | Admitting: Occupational Therapy

## 2015-10-02 ENCOUNTER — Ambulatory Visit: Payer: Medicare Other | Admitting: Occupational Therapy

## 2015-10-02 ENCOUNTER — Telehealth: Payer: Self-pay | Admitting: Neurology

## 2015-10-02 DIAGNOSIS — R2681 Unsteadiness on feet: Secondary | ICD-10-CM | POA: Diagnosis not present

## 2015-10-02 DIAGNOSIS — R208 Other disturbances of skin sensation: Secondary | ICD-10-CM | POA: Diagnosis not present

## 2015-10-02 DIAGNOSIS — I69354 Hemiplegia and hemiparesis following cerebral infarction affecting left non-dominant side: Secondary | ICD-10-CM | POA: Diagnosis not present

## 2015-10-02 DIAGNOSIS — I639 Cerebral infarction, unspecified: Secondary | ICD-10-CM | POA: Diagnosis not present

## 2015-10-02 DIAGNOSIS — R2689 Other abnormalities of gait and mobility: Secondary | ICD-10-CM | POA: Diagnosis not present

## 2015-10-02 DIAGNOSIS — G8114 Spastic hemiplegia affecting left nondominant side: Secondary | ICD-10-CM | POA: Diagnosis not present

## 2015-10-02 DIAGNOSIS — R278 Other lack of coordination: Secondary | ICD-10-CM | POA: Diagnosis not present

## 2015-10-02 LAB — CUP PACEART REMOTE DEVICE CHECK: Date Time Interrogation Session: 20170502100703

## 2015-10-02 NOTE — Therapy (Signed)
[facility] [address] [place], [place], [address] Phone: [phone]   Fax:  [phone]  Physical Therapy Treatment and Discharge Summary  Patient Details  Name: Donald [name] MRN: [id number] Date of Birth: 04-09-1946 Referring Provider: Ernestine Mcmurray, NP  Encounter Date: 10/02/2015      PT End of Session - 10/02/15 2212    Visit Number 15   Number of Visits 15   Date for PT Re-Evaluation 10/19/15   Authorization Type Medicare primary; [employer] secondary - G Codes and PN required every 10 visits   PT Start Time 1321   PT Stop Time 1400   PT Time Calculation (min) 39 min   Activity Tolerance Patient tolerated treatment well   Behavior During Therapy [facility] for tasks assessed/performed      Past Medical History  Diagnosis Date  . Hypertension   . Diabetes mellitus without complication ([place])   . Hyperlipemia   . large right middle cerebral artery infarct, embolic 08/05/6292    a. s/p IV tPA, b. source unknown, c. loop recorder placed  . Snoring 07/03/2015    Past Surgical History  Procedure Laterality Date  . Tee without cardioversion N/A 06/07/2013    Procedure: TRANSESOPHAGEAL ECHOCARDIOGRAM (TEE);  Surgeon: [name], MD;  Location: [facility] ENDOSCOPY;  Service: Cardiovascular;  Laterality: N/A;  . Loop recorder implant  06/07/2013    MDT LinQ implanted by Dr [name] for cryptogenic stroke  . Loop recorder implant N/A 06/07/2013    Procedure: LOOP RECORDER IMPLANT;  Surgeon: [name], MD;  Location: [facility] CATH LAB;  Service: Cardiovascular;  Laterality: N/A;    There were no vitals filed for this visit.      Subjective Assessment - 10/02/15 2204    Subjective Pt very concerned about having personal AFO prior to going out of town around 5/10. Pt feels as though he is ready for DC from PT. Does not have any concerns specific to functional mobility. Feels as though the brace will be a good tool to  utilize for the next 6 months to a year to improve gait pattern and keep pt safe while walking on varying surfaces.   Patient is accompained by: Family member  wife, [name]   Pertinent History Pt likes to be called "[name]".   PMH significant for: CVA (large R MCA infarct, embolic),  seizures, HTN, HLD, DM, persistent A-Fib   Patient Stated Goals Work on walking downstairs, work on walking technique to make it better, and to work on my reaction time when I lose my balance to my left side.   Currently in Pain? No/denies                         [facility] Adult PT Treatment/Exercise - 10/02/15 0001    Transfers   Transfers Sit to Stand;Stand to Sit   Sit to Stand 6: Modified independent (Device/Increase time)   Stand to Sit 6: Modified independent (Device/Increase time)   Ambulation/Gait   Ambulation/Gait Yes   Ambulation/Gait Assistance 6: Modified independent (Device/Increase time)  supervision on outdoor complaint surfaces   Ambulation Distance (Feet) 1050 Feet   Assistive device Straight cane  left PLS AFO (walk on brace)   Gait Pattern Step-through pattern;Decreased arm swing - left;Decreased hip/knee flexion - left;Decreased weight shift to left;Abducted - left;Narrow base of support   Ambulation Surface Level;Unlevel;Indoor;Outdoor;Paved;Grass   Stairs Yes   Stairs Assistance 6: Modified independent (Device/Increase time)  Stair Management Technique Forwards;One rail Right;Alternating pattern  L AFO   Number of Stairs 8   Height of Stairs 6   Ramp 6: Modified independent (Device)  with cane/left PLS AFO   Curb 6: Modified independent (Device/increase time)  with cane/ left PLS AFO                PT Education - 10/02/15 2207    Education provided Yes   Education Details PT goals, findings, progress, and D/C plan. Unable to simulate mowing grass; therefore, this PT cannot make definite recommendation for/against this activity. However, advise pt have one of  grandsons present should he decide to use lawn mower.   Person(s) Educated Patient;Spouse   Methods Explanation   Comprehension Verbalized understanding          PT Short Term Goals - 10/02/15 2214    PT SHORT TERM GOAL #1   Title Pt will perform initial HEP with mod I using paper handout to indicate safe daily compliance with home program.   (Target date: 08/29/15)  Correction: Target date 08/29/15   Baseline Met 3/29.   Status Achieved   PT SHORT TERM GOAL #2   Title Complete FGA and improve score by 4 points to indicate improved dynamic gait stability.   (Target date: 08/29/15)   Baseline 08/15/15: baseline 11/30 today. 09/06/15: scored 14/30 today, 3 point improvement   Status Not Met   PT SHORT TERM GOAL #3   Title Pt will improve gait velocity from 1.79 ft/sec to > / = 2.09 ft/sec to indicate decreased risk of recurrent falls.   (Target date: 08/29/15)   Baseline 3/29: gait velocity = 2.33 ft/sec   Status Achieved   PT SHORT TERM GOAL #4   Title Pt will negotiate 2 stairs without rails with mod I using LRAD to indicate increased independence using primary home entrance.  (Target date: 08/29/15)   Baseline Met 3/29 using SPC.   Status Achieved   PT SHORT TERM GOAL #5   Title Pt will ambulate > 300' over level, indoor surfaces without AFO with no evidence of L ankle instability (excessive forefoot supination) to indicate increased safety with ambulation.   (Target date: 08/29/15)   Baseline Met 3/27.   Status Achieved   PT SHORT TERM GOAL #6   Title Pt will ambulate 50' over grass surfaces with supervision using LRAD to increase pt safety attending grandson's soccer games.   (Target date: 08/29/15)   Baseline Met 3/27.   Status Achieved   PT SHORT TERM GOAL #7   Title Pt will perform floor transfer (supine on floor > standing) with min A without use of external aid to indicate increased safety with fall recovery.   (Target date: 08/29/15)   Baseline Met 4/19.   Status Achieved            PT Long Term Goals - 10/02/15 2212    PT LONG TERM GOAL #1   Title Pt will improve gait velocity from 1.79 ft/sec to > / = 2.39 ft/sec to indicate increased efficiency of ambulation.     (Modified Target date: 10/03/15)   Baseline 2.33 ft/sec   Status Partially Met   PT LONG TERM GOAL #2   Title Pt will score > / = 19/30 on FGA to indicate decreased fall risk.   (Target date: 09/19/15)   Baseline Deferred; pt unlikely to meet tihs goal due to CVA-related gait impairments.    Status Deferred   PT LONG  TERM GOAL #3   Title Pt will ambulate > 500' over unlevel, paved surfaces without AFO with mod I using LRAD with no L ankle instability to indicate safety with community mobility.  (Target date: 09/19/15)   Status Achieved   PT LONG TERM GOAL #4   Title Pt will ambulate 21' over unlevel, grass surfaces with mod I using LRAD to indicate increased independence attending grandson's soccer games.   (Modified Target date: 10/03/15)   Baseline Met 5/2 using SPC and L AFO.   Status Achieved   PT LONG TERM GOAL #5   Title Pt will perform floor transfer (supine on floor > standing) with supervision without use of external aid to indicate increased safety with fall recovery.  (Modified Target date: 10/03/15)   Baseline 4/19: Pt required min A for floor transfer without external aid.    Status Partially Met   PT LONG TERM GOAL #6   Title Pt will negotiate 4 stairs without rails with mod I using LRAD to indicate increased access to community.   (Target date: 09/19/15)   Baseline Met 4/19.   Status Achieved   PT LONG TERM GOAL #7   Title Pt will negotiate 4 stairs with single rail using reciprocal pattern with supervision to address pt-stated goal and to indicate progress toward PLOF.  (Target date: 10/03/15)   Baseline Met 5/2.   Status Achieved               Plan - Oct 30, 2015 08/20/2214    Clinical Impression Statement Pt has met 6/7 STG's and 4/7 LTG's. Goals for floor transfer and gait velocity  partially met, as both were improved but not to goal-level. Due to significant progress, pt will be discharged from outpatient PT at this time. Pt and wife verbalized understanding and are in full agreement with DC plan,    Consulted and Agree with Plan of Care Patient;Family member/caregiver   Family Member Consulted wife, [name]      Patient will benefit from skilled therapeutic intervention in order to improve the following deficits and impairments:     Visit Diagnosis: Other abnormalities of gait and mobility  Unsteadiness on feet       G-Codes - 10-30-2015 Aug 20, 2218    Functional Assessment Tool Used gait velocity = 2.33 ft/sec (normal gait velocity = 4.37 ft/sec)   Functional Limitation Mobility: Walking and moving around   Mobility: Walking and Moving Around Goal Status [phone]) At least 20 percent but less than 40 percent impaired, limited or restricted   Mobility: Walking and Moving Around Discharge Status [phone]) At least 40 percent but less than 60 percent impaired, limited or restricted      Problem List Patient Active Problem List   Diagnosis Date Noted  . Snoring 07/03/2015  . Persistent atrial fibrillation ([place]) 04/16/2015  . Hypokalemia 09/20/2014  . HTN (hypertension) 09/19/2014  . HLD (hyperlipidemia) 09/19/2014  . Diabetes mellitus without complication ([place]) 29/51/8841  . Jerking 09/19/2014  . Alterations of sensations, late effect of cerebrovascular disease 11/07/2013  . Disturbances of vision, late effect of cerebrovascular disease 11/07/2013  . Sinus bradycardia 08/08/2013  . 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[place]) 07/26/2013  . Left-sided neglect 07/26/2013  . Small vessel disease ([place]) 06/07/2013  . Obesity, unspecified 06/07/2013  . CVA (cerebral infarction) 06/07/2013  . large right middle cerebral artery infarct, embolic 66/11/3014  . Hypertension 06/03/2013  . Diabetes ([place]) 06/03/2013   PHYSICAL THERAPY DISCHARGE SUMMARY  Visits from  Start of Care: 15  Current functional level related to goals / functional outcomes: See above goals.   Remaining deficits: Pt continues to demonstrate gait impairments associated with L hemiplegia; LLE extensor tone; decreased selective control in LLE; balance impairments.  Education / Equipment: HEP. Recommendation for L PLS AFO. Plan: Patient agrees to discharge.  Patient goals were met. Patient is being discharged due to meeting the stated rehab goals.  ?????       Billie Ruddy, PT, DPT [facility] [address] [place], [place], [address] Phone: [phone]   Fax:  [phone] 10/02/2015, 10:24 PM  Name: Donald [name] MRN: [id number] Date of Birth: [birth year]

## 2015-10-02 NOTE — Therapy (Signed)
[facility] [address] [place], [place], [address] Phone: [phone]   Fax:  [phone]  Occupational Therapy Treatment  Patient Details  Name: Donald [name] MRN: [id number] Date of Birth: Jan 29, 1946 Referring Provider: Dr. Margette Fast  Encounter Date: 10/02/2015      OT End of Session - 10/02/15 1659    Visit Number 13   Number of Visits 17   Date for OT Re-Evaluation 11/28/15   Authorization Type Medicare   Authorization Time Period Progress note / gcode visit 9   Authorization - Visit Number 13   Authorization - Number of Visits 17   OT Start Time [id number]   OT Stop Time 1446   OT Time Calculation (min) 43 min   Activity Tolerance Patient tolerated treatment well   Behavior During Therapy [facility] for tasks assessed/performed      Past Medical History  Diagnosis Date  . Hypertension   . Diabetes mellitus without complication ([place])   . Hyperlipemia   . large right middle cerebral artery infarct, embolic [id number]    a. s/p IV tPA, b. source unknown, c. loop recorder placed  . Snoring 07/03/2015    Past Surgical History  Procedure Laterality Date  . Tee without cardioversion N/A 06/07/2013    Procedure: TRANSESOPHAGEAL ECHOCARDIOGRAM (TEE);  Surgeon: [name], MD;  Location: [facility] ENDOSCOPY;  Service: Cardiovascular;  Laterality: N/A;  . Loop recorder implant  06/07/2013    MDT LinQ implanted by Dr [name] for cryptogenic stroke  . Loop recorder implant N/A 06/07/2013    Procedure: LOOP RECORDER IMPLANT;  Surgeon: [name], MD;  Location: [facility] CATH LAB;  Service: Cardiovascular;  Laterality: N/A;    There were no vitals filed for this visit.      Subjective Assessment - 10/02/15 1651    Subjective  Thanks for talking with my wife and I - we are slowly adapting the supervision (for the shower transfer)   Patient is accompained by: Family member   Pertinent History Afib, seizure 09/2014,    Patient Stated Goals Play guitar    Currently in Pain? No/denies   Multiple Pain Sites No                      OT Treatments/Exercises (OP) - 10/02/15 1653    Neurological Re-education Exercises   Other Exercises 1 Neuromuscular reeducation to address sustained activation in left upper extremuity in higer ranges of shoulder flexion, extension, abduction, depression.  Patient able to complete repetitive chest press overhead with2 lb weighted dowel x 15 repetitions.  Patient needs frequent cueing for even weight distribution to both feet with increased arm activation and demand.l   Other Exercises 2 Weight bearing onto and off of left forearm in sidelying and sitting.  Patient needs mod facilitation initially for accurate muscle activation in correct direction.  Patient prefers to attempt to initiate this motion (flexion and rotation) with extensor patterning in trunk and LE's.  Once patient more familar with new pattern able to utilize a bit of momentum to achieve position with less support.  Patient had difficulty sustaining  scapular activation for more than 2-3 seconds in this position                OT Education - 10/02/15 1658    Education provided Yes   Education Details scapular stability in transitional movements   Person(s) Educated Patient   Methods Explanation;Demonstration   Comprehension Verbal cues required;Tactile cues  required          OT Short Term Goals - 09/24/15 1722    OT SHORT TERM GOAL #1   Title Patient will complete home exercise program independently due 09/13/15   Status Achieved   OT SHORT TERM GOAL #2   Title Patient will demonstrate improved coordination in left hand as evidenced by decrease time on 9 hole peg test by 10 seconds   Status Achieved   OT SHORT TERM GOAL #3   Title Patient will demonstrate ability to support neck of guitar with left hand while seated, and demonstrate adequate finger control to form 1-2 cord positions with  increased time   Status Achieved   OT SHORT TERM GOAL #4   Title Patient will adequately use bilateral hands to slice a soft fruit or vegetable (tomatao) without incident with minimal environmental set up and cueing   Status Achieved   OT SHORT TERM GOAL #5   Title Patient will demonstrate adequate strength and control of left UE to lift an object weighing up to 3 lb weight into overhead shelf    Status Achieved           OT Long Term Goals - 10/02/15 1711    OT LONG TERM GOAL #1   Title Patient will complete update home exercise / home activity program independently (due 10/03/15)   Status On-going   OT LONG TERM GOAL #2   Title Patient will demonstrate improved fine motor coordination in left UE as evidenced by 20 second reduction in time on 9 hole peg test   Status Achieved  49.25 sec!   OT LONG TERM GOAL #3   Title Patient will adjust left hand position on neck of guitar for 3-4 chord changes in time with simple song.   Status On-going   OT LONG TERM GOAL #4   Title Patient will unload dishwasher lifting 2-3 plates at once (or comparable weight)  into overhead shelf    Status On-going   OT LONG TERM GOAL #5   Title Patient will carry beverage in left hand  while walking in community at least 150 feet,using cane in right hand in minimally crowded / congested environment    Status On-going               Plan - 10/02/15 1702    Clinical Impression Statement Patient showing steady improvement toward long term goals, need additional time to complete final four visits due to planned family trips for patient and his wife.     Rehab Potential Good   OT Frequency 2x / week   OT Duration 8 weeks   OT Treatment/Interventions Self-care/ADL training;Ultrasound;Therapeutic exercise;Neuromuscular education;Manual Therapy;Functional Mobility Training;DME and/or AE instruction;Splinting;Therapeutic exercises;Therapeutic activities;Balance training;Patient/family education   Plan NMR  LUE/Trunk   OT Home Exercise Plan Initiated 08/21/15, reviewed 08/22/15   Consulted and Agree with Plan of Care Patient;Family member/caregiver   Family Member Consulted wife [name]      Patient will benefit from skilled therapeutic intervention in order to improve the following deficits and impairments:  Decreased activity tolerance, Decreased balance, Decreased cognition, Decreased coordination, Decreased range of motion, Decreased mobility, Decreased strength, Difficulty walking, Impaired perceived functional ability, Improper body mechanics, Impaired UE functional use, Impaired tone, Impaired sensation  Visit Diagnosis: Unsteadiness on feet - Plan: Ot plan of care cert/re-cert  Other lack of coordination - Plan: Ot plan of care cert/re-cert  Other disturbances of skin sensation - Plan: Ot plan of care cert/re-cert  Hemiplegia and hemiparesis following cerebral infarction affecting left non-dominant side ([place]) - Plan: Ot plan of care cert/re-cert    Problem List Patient Active Problem List   Diagnosis Date Noted  . Snoring 07/03/2015  . Persistent atrial fibrillation ([place]) 04/16/2015  . Hypokalemia 09/20/2014  . HTN (hypertension) 09/19/2014  . HLD (hyperlipidemia) 09/19/2014  . Diabetes mellitus without complication ([place]) [id number]  . Jerking 09/19/2014  . Alterations of sensations, late effect of cerebrovascular disease 11/07/2013  . Disturbances of vision, late effect of cerebrovascular disease 11/07/2013  . Sinus bradycardia 08/08/2013  . 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[place]) 07/26/2013  . Left-sided neglect 07/26/2013  . Small vessel disease ([place]) 06/07/2013  . Obesity, unspecified 06/07/2013  . CVA (cerebral infarction) 06/07/2013  . large right middle cerebral artery infarct, embolic [id number]  . Hypertension 06/03/2013  . Diabetes ([place]) 06/03/2013    Mariah Milling, OTR/L 10/02/2015, 5:16 PM  [facility] [address] [place], [place], [address] Phone: [phone]   Fax:  [phone]  Name: Donald [name] MRN: [id number] Date of Birth: Aug 18, 1945

## 2015-10-02 NOTE — Therapy (Signed)
[facility] [address] [place], [place], [address] Phone: [phone]   Fax:  [phone]  Occupational Therapy Treatment  Patient Details  Name: Donald [name] MRN: [id number] Date of Birth: 1945-08-08 Referring Provider: Dr. Margette Fast  Encounter Date: 10/01/2015      OT End of Session - 10/02/15 0946    Visit Number 12   Number of Visits 17   Date for OT Re-Evaluation 10/03/15   Authorization Type Medicare   Authorization Time Period Progress note / gcode 10th visit   Authorization - Visit Number 12   Authorization - Number of Visits 17   OT Start Time [id number]   OT Stop Time 1530   OT Time Calculation (min) 43 min   Activity Tolerance Patient tolerated treatment well   Behavior During Therapy [facility] for tasks assessed/performed      Past Medical History  Diagnosis Date  . Hypertension   . Diabetes mellitus without complication ([place])   . Hyperlipemia   . large right middle cerebral artery infarct, embolic [id number]    a. s/p IV tPA, b. source unknown, c. loop recorder placed  . Snoring 07/03/2015    Past Surgical History  Procedure Laterality Date  . Tee without cardioversion N/A 06/07/2013    Procedure: TRANSESOPHAGEAL ECHOCARDIOGRAM (TEE);  Surgeon: [name], MD;  Location: [facility] ENDOSCOPY;  Service: Cardiovascular;  Laterality: N/A;  . Loop recorder implant  06/07/2013    MDT LinQ implanted by Dr [name] for cryptogenic stroke  . Loop recorder implant N/A 06/07/2013    Procedure: LOOP RECORDER IMPLANT;  Surgeon: [name], MD;  Location: [facility] CATH LAB;  Service: Cardiovascular;  Laterality: N/A;    There were no vitals filed for this visit.      Subjective Assessment - 10/02/15 0939    Subjective  My left arm is so much stronger.     Patient is accompained by: Family member   Pertinent History Afib, seizure 09/2014,    Patient Stated Goals Play guitar    Currently in Pain? No/denies    Multiple Pain Sites No                      OT Treatments/Exercises (OP) - 10/02/15 0001    Neurological Re-education Exercises   Other Exercises 1 Neuromuscular reeducation to promote increased strength and sustainabllity of shoulder girdle musculature in transitioning from sidelying to side sitting.  Patient having great difficulty iniitally, and cramping in left thigh/groin area.  Discussed potential stretch of this position, but also wrong muscle activation for this transition.  Patient needed min assist for forward weight shift to come up onto left forearm.  Patient with difficulty sustaining active posture here due to proximal weakness.  Followed with active shoulder flexion in sitting , stabnding, and stepping.  Practiced walking with attention to full arm position (not fully internally rotated at side.)  Patient trying out AFO today to improve ankle stability.  He has been fitted for this AFO, and should receive his own brace shortly.  Patient with compromised gait pattern when upPer extremity activIty added while walking.  Patient with SIGNIFICANT improvement overall at ability to sustain equal stance time on each foot during walking - even functional ambulation.                  OT Education - 10/02/15 0945    Education provided Yes   Education Details Postural control for functional ambulation - trunk,  head/neck and UE   Person(s) Educated Patient   Methods Explanation;Demonstration   Comprehension Verbalized understanding;Tactile cues required;Verbal cues required;Need further instruction          OT Short Term Goals - 09/24/15 1722    OT SHORT TERM GOAL #1   Title Patient will complete home exercise program independently due 09/13/15   Status Achieved   OT SHORT TERM GOAL #2   Title Patient will demonstrate improved coordination in left hand as evidenced by decrease time on 9 hole peg test by 10 seconds   Status Achieved   OT SHORT TERM GOAL #3   Title  Patient will demonstrate ability to support neck of guitar with left hand while seated, and demonstrate adequate finger control to form 1-2 cord positions with increased time   Status Achieved   OT SHORT TERM GOAL #4   Title Patient will adequately use bilateral hands to slice a soft fruit or vegetable (tomatao) without incident with minimal environmental set up and cueing   Status Achieved   OT SHORT TERM GOAL #5   Title Patient will demonstrate adequate strength and control of left UE to lift an object weighing up to 3 lb weight into overhead shelf    Status Achieved           OT Long Term Goals - 09/24/15 1723    OT LONG TERM GOAL #1   Title Patient will complete update home exercise / home activity program independently (due 10/03/15)   Status On-going   OT LONG TERM GOAL #2   Title Patient will demonstrate improved fine motor coordination in left UE as evidenced by 20 second reduction in time on 9 hole peg test   Status On-going   OT LONG TERM GOAL #3   Title Patient will adjust left hand position on neck of guitar for 3-4 chord changes in time with simple song.   Status On-going   OT LONG TERM GOAL #4   Title Patient will unload dishwasher lifting 2-3 plates at once (or comparable weight)  into overhead shelf    Status On-going   OT LONG TERM GOAL #5   Title Patient will carry beverage in left hand  while walking in community at least 150 feet,using cane in right hand in minimally crowded / congested environment    Status On-going               Plan - 10/02/15 0947    Clinical Impression Statement Patient showing progress with UE function and strength, adn even notes iLUE mprovement while walking.  Discussed extending date to finalize last OT visits - although do not anticipate request for additional OT visits.  Patient and wife in agreement.     Rehab Potential Good   OT Frequency 2x / week   OT Duration 8 weeks   OT Treatment/Interventions Self-care/ADL  training;Ultrasound;Therapeutic exercise;Neuromuscular education;Manual Therapy;Functional Mobility Training;DME and/or AE instruction;Splinting;Therapeutic exercises;Therapeutic activities;Balance training;Patient/family education   Plan NMR LUE/ trunk   OT Home Exercise Plan Initiated 08/21/15, reviewed 08/22/15   Consulted and Agree with Plan of Care Patient;Family member/caregiver   Family Member Consulted wife [name]      Patient will benefit from skilled therapeutic intervention in order to improve the following deficits and impairments:  Decreased activity tolerance, Decreased balance, Decreased cognition, Decreased coordination, Decreased range of motion, Decreased mobility, Decreased strength, Difficulty walking, Impaired perceived functional ability, Improper body mechanics, Impaired UE functional use, Impaired tone, Impaired sensation  Visit Diagnosis: Unsteadiness on  feet  Other lack of coordination  Other disturbances of skin sensation  Hemiplegia and hemiparesis following cerebral infarction affecting left non-dominant side [facility])    Problem List Patient Active Problem List   Diagnosis Date Noted  . Snoring 07/03/2015  . Persistent atrial fibrillation ([place]) 04/16/2015  . Hypokalemia 09/20/2014  . HTN (hypertension) 09/19/2014  . HLD (hyperlipidemia) 09/19/2014  . Diabetes mellitus without complication ([place]) [id number]  . Jerking 09/19/2014  . Alterations of sensations, late effect of cerebrovascular disease 11/07/2013  . Disturbances of vision, late effect of cerebrovascular disease 11/07/2013  . Sinus bradycardia 08/08/2013  . 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[place]) 07/26/2013  . Left-sided neglect 07/26/2013  . Small vessel disease ([place]) 06/07/2013  . Obesity, unspecified 06/07/2013  . CVA (cerebral infarction) 06/07/2013  . large right middle cerebral artery infarct, embolic [id number]  . Hypertension 06/03/2013  . Diabetes ([place]) 06/03/2013     Mariah Milling, OTR/L 10/02/2015, 9:51 AM  [facility] [address] [place], [place], [address] Phone: [phone]   Fax:  [phone]  Name: Travaughn Frelich MRN: [id number] Date of Birth: 1946/04/25

## 2015-10-02 NOTE — Progress Notes (Signed)
Carelink Summary Report / Loop Recorder 

## 2015-10-02 NOTE — Telephone Encounter (Signed)
[facility] [phone] called regarding letter of medical necessity that was faxed to our office Thursday 09/27/15 2:01pm, letter needs to be signed by Dr. Jannifer Franklin prior to patient leaving to go out of town for several weeks on May  9th, once they receive letter, letter then has to be authorized which also creates a delay. Jarae/Practitioner at [facility] [employer] clinic, can come by our office to bring copy to be signed or our office can fax letter back to [phone].

## 2015-10-03 NOTE — Telephone Encounter (Signed)
Fax received and given to MD for signature.

## 2015-10-03 NOTE — Telephone Encounter (Signed)
Have not yet received letter for Dr. Jannifer Franklin' signature. Returned call to [place] ans she has agreed to re-fax paperwork.

## 2015-10-23 ENCOUNTER — Telehealth: Payer: Self-pay

## 2015-10-23 ENCOUNTER — Telehealth: Payer: Self-pay | Admitting: Physician Assistant

## 2015-10-23 NOTE — Telephone Encounter (Signed)
Sent to [facility]

## 2015-10-23 NOTE — Telephone Encounter (Signed)
New message      Please call regarding prior auth on eliquis

## 2015-10-23 NOTE — Telephone Encounter (Signed)
Prior auth for Eliquis 5 mg submitted to [facility]. 

## 2015-10-27 LAB — CUP PACEART REMOTE DEVICE CHECK: Date Time Interrogation Session: 20170402093601

## 2015-10-27 NOTE — Progress Notes (Signed)
[facility] summary report received. Battery status OK. Normal device function. No new symptom episodes, tachy episodes, brady, or pause episodes. 100% AF, V rates controlled, +Eliquis. Monthly summary reports and ROV/PRN

## 2015-10-31 NOTE — Progress Notes (Signed)
Electrophysiology Office Note Date: 11/01/2015  ID:  Donald [name], DOB Aug 19, 1945, MRN [id number]  PCP: London Pepper, MD Electrophysiologist: [name]  CC:  follow up on ILR interrogation  Donald [name] is a 70 y.o. male who presents today for planned electrophysiology followup.   He underwent ILR implantation for stroke and had atrial fibrillation discovered. He has been on Eliquis  He was noted to have multiple pauses of up to 3 seconds that have been asymptomatic.  He was seen by Dr. [name] 04/15/16 and reassured regarding the pauses, and his ILR was reprorgammed to detect pauses >4.5 seconds.  He was recommended to undergo sleep study. He did get done without significant sleep apnea noted.    He was seen by myself in Feb, he mentioned he may feel like he doesn't quite have the energy he used to, but does go for walks for exercise and does his routine daily activities without difficulty.  After lengthy discussion about treatment options, DCCV, ADD therapy,  he felt this may be more cabin fever, in the winter not out and about as often as usual.  He comes today and does state that he feels more energetic, is back at PT/OT for his L sided weakness and is really feeling well.   He continues to deny any kind of CP, palpitations or SOB, no dizziness, neat syncope or syncope.  He denies any bleeding or signs of bleeding.  Has routine labs via his PMD, states he had a full annual evaluation with labs 2 weeks ago and was reported that his labs looked the best they have in years.   Past Medical History  Diagnosis Date  . Hypertension   . Diabetes mellitus without complication ([place])   . Hyperlipemia   . [name] right middle cerebral artery infarct, embolic [id number]    a. s/p IV tPA, b. source unknown, c. loop recorder placed  . Snoring 07/03/2015   Past Surgical History  Procedure Laterality Date  . Tee without cardioversion N/A 06/07/2013    Procedure: TRANSESOPHAGEAL  ECHOCARDIOGRAM (TEE);  Surgeon: [name], MD;  Location: [facility] ENDOSCOPY;  Service: Cardiovascular;  Laterality: N/A;  . Loop recorder implant  06/07/2013    MDT LinQ implanted by Dr [name] for cryptogenic stroke  . Loop recorder implant N/A 06/07/2013    Procedure: LOOP RECORDER IMPLANT;  Surgeon: [name], MD;  Location: [facility] CATH LAB;  Service: Cardiovascular;  Laterality: N/A;    Current Outpatient Prescriptions  Medication Sig Dispense Refill  . amLODipine (NORVASC) 10 MG tablet Take 1 tablet (10 mg total) by mouth daily. 30 tablet 1  . apixaban (ELIQUIS) 5 MG TABS tablet Take 1 tablet (5 mg total) by mouth 2 (two) times daily. 180 tablet 3  . Cholecalciferol (VITAMIN D-3) 1000 units CAPS Take by mouth daily.    . cloNIDine (CATAPRES) 0.3 MG tablet Take 0.3 mg by mouth 2 (two) times daily.    . hydrALAZINE (APRESOLINE) 10 MG tablet Take 10 mg by mouth daily.     . hydrochlorothiazide (HYDRODIURIL) 25 MG tablet Take 25 mg by mouth daily.    Marland Kitchen levETIRAcetam (KEPPRA XR) 500 MG 24 hr tablet Take 2 tablets by mouth at  bedtime 180 tablet 3  . lisinopril (PRINIVIL,ZESTRIL) 40 MG tablet Take 1 tablet (40 mg total) by mouth daily. 30 tablet 1  . metFORMIN (GLUCOPHAGE-XR) 750 MG 24 hr tablet Take 750 mg by mouth 2 (two) times daily.     . Multiple  Vitamin (MULTIVITAMIN) capsule Take 1 capsule by mouth daily.    . potassium chloride SA (K-DUR,KLOR-CON) 20 MEQ tablet Take 20 mEq by mouth 3 (three) times daily.      No current facility-administered medications for this visit.    Allergies:   Bidil   Social History: Social History   Social History  . Marital Status: Married    Spouse Name: [name]  . Number of Children: 2  . Years of Education: College   Occupational History  . Retired    Social History Main Topics  . Smoking status: Former Research scientist (life sciences)  . Smokeless tobacco: Never Used     Comment: Quit 25 years ago.  . Alcohol Use: 0.6 oz/week    1 Glasses of wine per week      Comment: Rare  . Drug Use: No  . Sexual Activity: No   Other Topics Concern  . Not on file   Social History Narrative   Patient lives at home [name]).   Retired works part time 10 hours a week.   Right handed   Education college   Caffeine: one cup daily coffee   No soda or tea.    Family History: Family History  Problem Relation Age of Onset  . Dementia Mother   . Diabetes Mother   . Heart attack Father     Review of Systems: All other systems reviewed and are otherwise negative except as noted above.   Physical Exam: VS:  BP 142/78 mmHg  Pulse 68  Ht 6\' 3"  (1.905 m)  Wt 225 lb (102.059 kg)  BMI 28.12 kg/m2 , BMI Body mass index is 28.12 kg/(m^2). Wt Readings from Last 3 Encounters:  11/01/15 225 lb (102.059 kg)  07/23/15 235 lb (106.595 kg)  07/09/15 224 lb (101.606 kg)   BP 142/78 mmHg  Pulse 68  Ht 6\' 3"  (1.905 m)  Wt 225 lb (102.059 kg)  BMI 28.12 kg/m2  GEN- The patient is well appearing, alert and oriented x 3 today.   HEENT: normocephalic, atraumatic; sclera clear, conjunctiva pink; hearing intact; oropharynx clear; neck supple  Lungs- Clear to ausculation bilaterally, normal work of breathing.  No wheezes, rales, rhonchi Heart- Irregular rate and rhythm  GI- soft, non-tender, non-distended, bowel sounds present  Extremities- no clubbing, cyanosis, 1+edema ( he reports chronically by the end oft he day, resolves in AM) MS- no significant deformity or atrophy Skin- warm and dry, no rash or lesion  Psych- euthymic mood, full affect Neuro- walks with the aid of cane, L sided weakness  EKG:  Feb/2016  AFib 83bpm ILR interrogation today: persistent AF, avg HR appears 60's-80's  08/03/15: Echocardiogram Study Conclusions - Left ventricle: The cavity size was normal. Wall thickness was  increased in a pattern of mild LVH. Systolic function was  vigorous. The estimated ejection fraction was in the range of 65%  to 70%. Wall motion was normal; there  were no regional wall  motion abnormalities. The study is not technically sufficient to  allow evaluation of LV diastolic function. - Aortic valve: Mildly calcified annulus. Trileaflet. - Mitral valve: There was trivial regurgitation. - Left atrium: The atrium was mildly dilated. - Right atrium: The atrium was at the upper limits of normal in  size. - Atrial septum: No defect or patent foramen ovale was identified. - Tricuspid valve: There was trivial regurgitation. - Pulmonary arteries: Systolic pressure could not be accurately  estimated. - Pericardium, extracardiac: A trivial pericardial effusion was  identified circumferential to  the heart. Impressions: - Mild LVH with LVEF 65-70%. Indeterminate diastolic function in  the setting of atrial fibrillation. Mild left atrial enlargement.  Mildly sclerotic aortic valve. Trivial mitral and tricuspid  regurgitation. Trivial pericardial effusion.   06/07/13: TEE Study Conclusions - Left ventricle: Systolic function was normal. The estimated ejection fraction was in the range of 55% to 60%. Wall motion was normal; there were no regional wall motion abnormalities. - Left atrium: The atrium was dilated. No evidence of thrombus in the atrial cavity or appendage. No evidence of thrombus in the atrial cavity or appendage. No evidence of thrombus in the appendage. - Right atrium: No evidence of thrombus in the atrial cavity or appendage. - Atrial septum: No defect or patent foramen ovale was identified. Echo contrast study showed no right-to-left atrial level shunt, following an increase in RA pressure induced by provocative maneuvers. - Tricuspid valve: No evidence of vegetation. No evidence of vegetation. - Pulmonic valve: No evidence of vegetation.  Recent Labs: No results found for requested labs within last 365 days.   Assessment and Plan: 1.  Persistent atrial fibrillation The patient has  asymptomatic persistent atrial fibrillation Continue Eliquis for CHADS2VASC of at least 5  He reports today that his insurance is covering the Eliquis Ventricular rates are well controlled, avg appears in the 60's He sounds completely  asymptomatic from the AF, though inquires into the AF clinic as a resource and again given information regarding the program.  2.  HTN       Looks OK, follow       Stable off of Coreg, weaned off for pauses    Current medicines are reviewed at length with the patient today.   The patient does not have concerns regarding his medicines.  The following changes were made today:  none  Disposition:  6 months with Dr. [name], ILR interrogations as usual, we will see him back sooner if needed.   Venetia Night, PA-C  11/01/2015 4:52 PM   [place] [address] [phone] (office) [phone] (fax)

## 2015-11-01 ENCOUNTER — Encounter: Payer: Self-pay | Admitting: Physician Assistant

## 2015-11-01 ENCOUNTER — Ambulatory Visit (INDEPENDENT_AMBULATORY_CARE_PROVIDER_SITE_OTHER): Payer: Medicare Other | Admitting: Physician Assistant

## 2015-11-01 ENCOUNTER — Ambulatory Visit (INDEPENDENT_AMBULATORY_CARE_PROVIDER_SITE_OTHER): Payer: Medicare Other | Admitting: *Deleted

## 2015-11-01 VITALS — BP 142/78 | HR 68 | Ht 75.0 in | Wt 225.0 lb

## 2015-11-01 DIAGNOSIS — I10 Essential (primary) hypertension: Secondary | ICD-10-CM

## 2015-11-01 DIAGNOSIS — I4819 Other persistent atrial fibrillation: Secondary | ICD-10-CM

## 2015-11-01 DIAGNOSIS — I638 Other cerebral infarction: Secondary | ICD-10-CM

## 2015-11-01 DIAGNOSIS — I481 Persistent atrial fibrillation: Secondary | ICD-10-CM | POA: Diagnosis not present

## 2015-11-01 DIAGNOSIS — I6389 Other cerebral infarction: Secondary | ICD-10-CM

## 2015-11-01 DIAGNOSIS — I639 Cerebral infarction, unspecified: Secondary | ICD-10-CM

## 2015-11-01 NOTE — Patient Instructions (Addendum)
Medication Instructions:   Your physician recommends that you continue on your current medications as directed. Please refer to the Current Medication list given to you today.   If you need a refill on your cardiac medications before your next appointment, please call your pharmacy.  Labwork:  NONE ORDER TODAY    Testing/Procedures:  NONE ORDER TODAY    Follow-Up:  Your physician wants you to follow-up in:  IN 6 MONTHS WITH DR [name]  You will receive a reminder letter in the mail two months in advance. If you don't receive a letter, please call our office to schedule the follow-up appointment.  CONTINUE YOUR MONTHLY LOOP RECORDER   Any Other Special Instructions Will Be Listed Below (If Applicable).

## 2015-11-02 NOTE — Progress Notes (Signed)
Carelink Summary Report / Loop Recorder 

## 2015-11-06 ENCOUNTER — Ambulatory Visit: Payer: Medicare Other | Attending: Neurology | Admitting: Occupational Therapy

## 2015-11-06 ENCOUNTER — Encounter: Payer: Self-pay | Admitting: Occupational Therapy

## 2015-11-06 DIAGNOSIS — R208 Other disturbances of skin sensation: Secondary | ICD-10-CM | POA: Insufficient documentation

## 2015-11-06 DIAGNOSIS — R278 Other lack of coordination: Secondary | ICD-10-CM | POA: Insufficient documentation

## 2015-11-06 DIAGNOSIS — R2681 Unsteadiness on feet: Secondary | ICD-10-CM | POA: Diagnosis not present

## 2015-11-06 DIAGNOSIS — I69354 Hemiplegia and hemiparesis following cerebral infarction affecting left non-dominant side: Secondary | ICD-10-CM | POA: Diagnosis not present

## 2015-11-06 DIAGNOSIS — G8114 Spastic hemiplegia affecting left nondominant side: Secondary | ICD-10-CM | POA: Diagnosis not present

## 2015-11-06 NOTE — Therapy (Signed)
[facility] [address] [place], [place], [address] Phone: [phone]   Fax:  [phone]  Occupational Therapy Treatment  Patient Details  Name: Donald [name] MRN: [id number] Date of Birth: September 04, 1945 Referring Provider: Dr. Margette Fast  Encounter Date: 11/06/2015      OT End of Session - 11/06/15 1724    Visit Number 14   Number of Visits 17   Date for OT Re-Evaluation 11/28/15   Authorization Type Medicare   Authorization Time Period Progress note / gcode visit 47   Authorization - Visit Number 14   Authorization - Number of Visits 17   OT Start Time [id number]   OT Stop Time 1530   OT Time Calculation (min) 45 min   Activity Tolerance Patient tolerated treatment well   Behavior During Therapy [facility] for tasks assessed/performed      Past Medical History  Diagnosis Date  . Hypertension   . Diabetes mellitus without complication ([place])   . Hyperlipemia   . large right middle cerebral artery infarct, embolic [id number]    a. s/p IV tPA, b. source unknown, c. loop recorder placed  . Snoring 07/03/2015    Past Surgical History  Procedure Laterality Date  . Tee without cardioversion N/A 06/07/2013    Procedure: TRANSESOPHAGEAL ECHOCARDIOGRAM (TEE);  Surgeon: [name], MD;  Location: [facility] ENDOSCOPY;  Service: Cardiovascular;  Laterality: N/A;  . Loop recorder implant  06/07/2013    MDT LinQ implanted by Dr [name] for cryptogenic stroke  . Loop recorder implant N/A 06/07/2013    Procedure: LOOP RECORDER IMPLANT;  Surgeon: [name], MD;  Location: [facility] CATH LAB;  Service: Cardiovascular;  Laterality: N/A;    There were no vitals filed for this visit.      Subjective Assessment - 11/06/15 1713    Subjective  Patient indicates that he is not tolerating the guitar strings on his left finger tips - will discontinue goals relating to this.   Patient is accompained by: Family member   Currently in Pain?  No/denies   Multiple Pain Sites No                      OT Treatments/Exercises (OP) - 11/06/15 0001    ADLs   Functional Mobility Patient with improved stance time on left LE with new AFO.  Patient very mindful of importance of evenly weighting legs while walking to help maintain better muscle balance in trunk and left arm.  Patient able to carry a styrofoam cup 2/3 full of water around semi-distracting gym environment without spilling water or crushing the pliable cup.     Leisure Patient reports he cannot tolerate playing guitar, and has not actually attempted any strategies that his musician friends have offered to aide toward this end.  Patient feels he may be more interested now in playing keyboard.  Encouraged patient to attempt this as he enjoys music, and this would be an excellent challenge for dexterity in left hand.  Wil discontinue guitar related goals as discussed with patient.     Neurological Re-education Exercises   Other Exercises 1 Neuromuscular reeducation to address strengthening of left upper extremity with proper alignment and activation, e.g. mpdified push ups (standing at high plinth) and tricep dips.  Patient with difficulty sustaining muscle control throughout ranges when strength challenged, limited ability to do subsequent repetitions.  Worked in high reaching while sidesitting on left forearm.  Patient has difficulty sustaining trunk and shoulder  activation to help him lift off surface.  When really taxed - patient has tendency to hold his breath.  Patient is ready for more strengthening / conditioning type activities for left UE, in therapeutic environment.                   OT Short Term Goals - 09/24/15 1722    OT SHORT TERM GOAL #1   Title Patient will complete home exercise program independently due 09/13/15   Status Achieved   OT SHORT TERM GOAL #2   Title Patient will demonstrate improved coordination in left hand as evidenced by decrease  time on 9 hole peg test by 10 seconds   Status Achieved   OT SHORT TERM GOAL #3   Title Patient will demonstrate ability to support neck of guitar with left hand while seated, and demonstrate adequate finger control to form 1-2 cord positions with increased time   Status Achieved   OT SHORT TERM GOAL #4   Title Patient will adequately use bilateral hands to slice a soft fruit or vegetable (tomatao) without incident with minimal environmental set up and cueing   Status Achieved   OT SHORT TERM GOAL #5   Title Patient will demonstrate adequate strength and control of left UE to lift an object weighing up to 3 lb weight into overhead shelf    Status Achieved           OT Long Term Goals - 11/06/15 1725    OT LONG TERM GOAL #1   Title Patient will complete update home exercise / home activity program independently (due 10/03/15)   Status On-going   OT LONG TERM GOAL #2   Title Patient will demonstrate improved fine motor coordination in left UE as evidenced by 20 second reduction in time on 9 hole peg test   Status Achieved   OT LONG TERM GOAL #3   Title Patient will adjust left hand position on neck of guitar for 3-4 chord changes in time with simple song.   Status Deferred   OT LONG TERM GOAL #4   Title Patient will unload dishwasher lifting 2-3 plates at once (or comparable weight)  into overhead shelf    Status On-going   OT LONG TERM GOAL #5   Title Patient will carry beverage in left hand  while walking in community at least 150 feet,using cane in right hand in minimally crowded / congested environment    Status Achieved               Plan - 11/06/15 1724    Clinical Impression Statement Patient has returned from vacation, and is close to approaching long term goal achievement.     Rehab Potential Good   OT Frequency 2x / week   OT Duration 8 weeks   OT Treatment/Interventions Self-care/ADL training;Ultrasound;Therapeutic exercise;Neuromuscular education;Manual  Therapy;Functional Mobility Training;DME and/or AE instruction;Splinting;Therapeutic exercises;Therapeutic activities;Balance training;Patient/family education   Plan NMR LUE / TRUNK, strengthening - closed chain   OT Home Exercise Plan Initiated 08/21/15, reviewed 08/22/15   Consulted and Agree with Plan of Care Patient;Family member/caregiver   Family Member Consulted wife [name]      Patient will benefit from skilled therapeutic intervention in order to improve the following deficits and impairments:  Decreased activity tolerance, Decreased balance, Decreased cognition, Decreased coordination, Decreased range of motion, Decreased mobility, Decreased strength, Difficulty walking, Impaired perceived functional ability, Improper body mechanics, Impaired UE functional use, Impaired tone, Impaired sensation  Visit Diagnosis: Unsteadiness  on feet  Other lack of coordination  Other disturbances of skin sensation  Hemiplegia and hemiparesis following cerebral infarction affecting left non-dominant side (HCC)  Spastic hemiplegia affecting left nondominant side ([place])    Problem List Patient Active Problem List   Diagnosis Date Noted  . Snoring 07/03/2015  . Persistent atrial fibrillation ([place]) 04/16/2015  . Hypokalemia 09/20/2014  . HTN (hypertension) 09/19/2014  . HLD (hyperlipidemia) 09/19/2014  . Diabetes mellitus without complication ([place]) [id number]  . Jerking 09/19/2014  . Alterations of sensations, late effect of cerebrovascular disease 11/07/2013  . Disturbances of vision, late effect of cerebrovascular disease 11/07/2013  . Sinus bradycardia 08/08/2013  . 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[place]) 07/26/2013  . Left-sided neglect 07/26/2013  . Small vessel disease ([place]) 06/07/2013  . Obesity, unspecified 06/07/2013  . CVA (cerebral infarction) 06/07/2013  . large right middle cerebral artery infarct, embolic [id number]  . Hypertension 06/03/2013  . Diabetes ([place])  06/03/2013    Mariah Milling, OTR/L 11/06/2015, 5:27 PM  [facility] [address] [place], [place], [address] Phone: [phone]   Fax:  [phone]  Name: Laden Presswood MRN: [id number] Date of Birth: 1945-12-04

## 2015-11-08 ENCOUNTER — Ambulatory Visit: Payer: Medicare Other | Admitting: Occupational Therapy

## 2015-11-08 DIAGNOSIS — R208 Other disturbances of skin sensation: Secondary | ICD-10-CM

## 2015-11-08 DIAGNOSIS — R278 Other lack of coordination: Secondary | ICD-10-CM

## 2015-11-08 DIAGNOSIS — I69354 Hemiplegia and hemiparesis following cerebral infarction affecting left non-dominant side: Secondary | ICD-10-CM

## 2015-11-08 DIAGNOSIS — G8114 Spastic hemiplegia affecting left nondominant side: Secondary | ICD-10-CM

## 2015-11-08 DIAGNOSIS — R2681 Unsteadiness on feet: Secondary | ICD-10-CM | POA: Diagnosis not present

## 2015-11-09 ENCOUNTER — Encounter: Payer: Self-pay | Admitting: Occupational Therapy

## 2015-11-09 NOTE — Therapy (Signed)
[facility] [address] [place], [place], [address] Phone: [phone]   Fax:  [phone]  Occupational Therapy Treatment  Patient Details  Name: Donald [name] MRN: [id number] Date of Birth: 14-Apr-1946 Referring Provider: Dr. Margette Fast  Encounter Date: 11/08/2015      OT End of Session - 11/09/15 0908    Visit Number 15   Number of Visits 17   Date for OT Re-Evaluation 11/28/15   Authorization Type Medicare   Authorization Time Period Progress note / gcode visit 63   Authorization - Visit Number 15   Authorization - Number of Visits 17   OT Start Time 1761   OT Stop Time 1530   OT Time Calculation (min) 43 min   Activity Tolerance Patient tolerated treatment well   Behavior During Therapy [facility] for tasks assessed/performed      Past Medical History  Diagnosis Date  . Hypertension   . Diabetes mellitus without complication ([place])   . Hyperlipemia   . large right middle cerebral artery infarct, embolic 6/0/7371    a. s/p IV tPA, b. source unknown, c. loop recorder placed  . Snoring 07/03/2015    Past Surgical History  Procedure Laterality Date  . Tee without cardioversion N/A 06/07/2013    Procedure: TRANSESOPHAGEAL ECHOCARDIOGRAM (TEE);  Surgeon: [name], MD;  Location: [facility] ENDOSCOPY;  Service: Cardiovascular;  Laterality: N/A;  . Loop recorder implant  06/07/2013    MDT LinQ implanted by Dr [name] for cryptogenic stroke  . Loop recorder implant N/A 06/07/2013    Procedure: LOOP RECORDER IMPLANT;  Surgeon: [name], MD;  Location: [facility] CATH LAB;  Service: Cardiovascular;  Laterality: N/A;    There were no vitals filed for this visit.      Subjective Assessment - 11/09/15 0848    Subjective  I could tell we really worked last time.  (Relating to modified push ups and tricep dips)   Patient is accompained by: Family member   Pertinent History Afib, seizure 09/2014,    Currently in Pain?  No/denies   Multiple Pain Sites No                      OT Treatments/Exercises (OP) - 11/09/15 0001    Neurological Re-education Exercises   Reciprocal Movements Neuromuscular reeducation exercises geared toward strengthening and conditioning.  Patient able to complete shoulder abduction, flexion, and upright row with 3 lb dumb bells with frequent yet intermittent cueing for postural control and alignment.  Patient still notes increased tension in arm with new activities, e.g. walking in community in large crowd.  Patient eager to transition to community wellness and discussed options of silver sneakers and/or [employer] membership to continue his health and fitness goals.  Patient discussed walking on treadmill in gym.  He has never attempted treadmill training since CVA.  Assisted patient onto treadmill and had him attempt walking at very slow speed 1.0.  Patient very dependent on UE support, and patient slightly anxious at left foot strike position (rotated outward)  encouraged patient that if he planned on walking on treadmill he would do best with direct supervision and slow start speed. Patient felt he may be more inclined to walkin neighborhood, or on indoor track,  Patient and wife agreeable to early discharge as reamining goals have been met.  Patient aware of how to return to therapy as needed.  OT Education - 11/09/15 0900    Education provided Yes   Education Details Light weight training for UE's, transition to community fitness and wellness program   Person(s) Educated Patient;Spouse   Methods Explanation;Demonstration   Comprehension Verbalized understanding;Returned demonstration          OT Short Term Goals - 09/24/15 1722    OT SHORT TERM GOAL #1   Title Patient will complete home exercise program independently due 09/13/15   Status Achieved   OT SHORT TERM GOAL #2   Title Patient will demonstrate improved coordination in left hand as  evidenced by decrease time on 9 hole peg test by 10 seconds   Status Achieved   OT SHORT TERM GOAL #3   Title Patient will demonstrate ability to support neck of guitar with left hand while seated, and demonstrate adequate finger control to form 1-2 cord positions with increased time   Status Achieved   OT SHORT TERM GOAL #4   Title Patient will adequately use bilateral hands to slice a soft fruit or vegetable (tomatao) without incident with minimal environmental set up and cueing   Status Achieved   OT SHORT TERM GOAL #5   Title Patient will demonstrate adequate strength and control of left UE to lift an object weighing up to 3 lb weight into overhead shelf    Status Achieved           OT Long Term Goals - 11/09/15 0911    OT LONG TERM GOAL #1   Title Patient will complete update home exercise / home activity program independently (due 10/03/15)   Status Achieved   OT LONG TERM GOAL #2   Title Patient will demonstrate improved fine motor coordination in left UE as evidenced by 20 second reduction in time on 9 hole peg test   Status Achieved   OT LONG TERM GOAL #3   Title Patient will adjust left hand position on neck of guitar for 3-4 chord changes in time with simple song.   Status Deferred   OT LONG TERM GOAL #4   Title Patient will unload dishwasher lifting 2-3 plates at once (or comparable weight)  into overhead shelf    Status Achieved   OT LONG TERM GOAL #5   Title Patient will carry beverage in left hand  while walking in community at least 150 feet,using cane in right hand in minimally crowded / congested environment    Status Achieved               Plan - 11/09/15 0909    Clinical Impression Statement Patient very pleased with progress and eager to transition to community fitness program.  Patient has met OT goals and is agreeable to discharge at this time.     Rehab Potential Good   OT Frequency 2x / week   OT Duration 8 weeks   OT Treatment/Interventions  Self-care/ADL training;Ultrasound;Therapeutic exercise;Neuromuscular education;Manual Therapy;Functional Mobility Training;DME and/or AE instruction;Splinting;Therapeutic exercises;Therapeutic activities;Balance training;Patient/family education   Plan Discharge from [facility] Initiated 08/21/15, reviewed 08/22/15, reviewed 11/08/15   Consulted and Agree with Plan of Care Patient;Family member/caregiver   Family Member Consulted wife [name]      Patient will benefit from skilled therapeutic intervention in order to improve the following deficits and impairments:  Decreased activity tolerance, Decreased balance, Decreased cognition, Decreased coordination, Decreased range of motion, Decreased mobility, Decreased strength, Difficulty walking, Impaired perceived functional ability, Improper body mechanics, Impaired UE functional use,  Impaired tone, Impaired sensation  Visit Diagnosis: Unsteadiness on feet  Other lack of coordination  Other disturbances of skin sensation  Hemiplegia and hemiparesis following cerebral infarction affecting left non-dominant side (HCC)  Spastic hemiplegia affecting left nondominant side (HCC)      G-Codes - 11/16/2015 0911    Functional Assessment Tool Used 9 HOLE PEG TEST, skilled clinical observation   Functional Limitation Carrying, moving and handling objects   Carrying, Moving and Handling Objects Current Status (J6734) At least 20 percent but less than 40 percent impaired, limited or restricted   Carrying, Moving and Handling Objects Goal Status (L9379) At least 20 percent but less than 40 percent impaired, limited or restricted   Carrying, Moving and Handling Objects Discharge Status [phone]) At least 20 percent but less than 40 percent impaired, limited or restricted      Problem List Patient Active Problem List   Diagnosis Date Noted  . Snoring 07/03/2015  . Persistent atrial fibrillation ([place]) 04/16/2015  . Hypokalemia  09/20/2014  . HTN (hypertension) 09/19/2014  . HLD (hyperlipidemia) 09/19/2014  . Diabetes mellitus without complication ([place]) 73/53/2992  . Jerking 09/19/2014  . Alterations of sensations, late effect of cerebrovascular disease 11/07/2013  . Disturbances of vision, late effect of cerebrovascular disease 11/07/2013  . Sinus bradycardia 08/08/2013  . 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[place]) 07/26/2013  . Left-sided neglect 07/26/2013  . Small vessel disease ([place]) 06/07/2013  . Obesity, unspecified 06/07/2013  . CVA (cerebral infarction) 06/07/2013  . large right middle cerebral artery infarct, embolic 42/68/3419  . Hypertension 06/03/2013  . Diabetes ([place]) 06/03/2013   OCCUPATIONAL THERAPY DISCHARGE SUMMARY  Visits from Start of Care: 15  Current functional level related to goals / functional outcomes: See goals above.   Patient with improved coordination and strength in left UE. Patient with improved functional mobility such that he can more effectively use left hand to carry items while walking.     Remaining deficits: Left hemiplegia, somatosensory loss, impaired ability to grade motor control - spasticity   Education / Equipment: HEP  For UE to sustain active motion and strength Plan: Patient agrees to discharge.  Patient goals were met. Patient is being discharged due to meeting the stated rehab goals.  ?????        Mariah Milling, OTR/L 2015/11/16, 9:12 AM  [facility] [address] [place], [place], [address] Phone: [phone]   Fax:  [phone]  Name: Donald [name] MRN: [id number] Date of Birth: 08/24/1945

## 2015-11-11 NOTE — Progress Notes (Signed)
Carelink summary report received. Battery status OK. Normal device function. No new symptom episodes, tachy episodes, brady, or pause episodes. 1 AF 100% +Eliquis Monthly summary reports and ROV/PRN 

## 2015-11-13 ENCOUNTER — Encounter: Payer: Medicare Other | Admitting: Occupational Therapy

## 2015-11-14 ENCOUNTER — Encounter: Payer: Medicare Other | Admitting: Occupational Therapy

## 2015-11-15 ENCOUNTER — Encounter: Payer: Medicare Other | Admitting: Occupational Therapy

## 2015-11-19 ENCOUNTER — Telehealth: Payer: Self-pay | Admitting: *Deleted

## 2015-11-19 MED ORDER — APIXABAN 5 MG PO TABS: 5.0000 mg | ORAL_TABLET | Freq: Two times a day (BID) | ORAL | Status: DC

## 2015-11-19 NOTE — Telephone Encounter (Signed)
Patient calling to have rx for Eliquis sent to [facility]. [facility] needs a new rx for Eliquis, the one sent in November is no longer valid in their system.

## 2015-11-22 ENCOUNTER — Encounter: Payer: Medicare Other | Admitting: Occupational Therapy

## 2015-12-03 ENCOUNTER — Ambulatory Visit (INDEPENDENT_AMBULATORY_CARE_PROVIDER_SITE_OTHER): Payer: Medicare Other | Admitting: *Deleted

## 2015-12-03 DIAGNOSIS — I638 Other cerebral infarction: Secondary | ICD-10-CM | POA: Diagnosis not present

## 2015-12-03 DIAGNOSIS — I6389 Other cerebral infarction: Secondary | ICD-10-CM

## 2015-12-03 NOTE — Progress Notes (Signed)
Carelink Summary Report / Loop Recorder 

## 2015-12-12 LAB — CUP PACEART REMOTE DEVICE CHECK: Date Time Interrogation Session: 20170601103640

## 2015-12-14 ENCOUNTER — Telehealth: Payer: Self-pay | Admitting: Cardiology

## 2015-12-14 NOTE — Telephone Encounter (Signed)
Attempted to call pt and request a manual transmission b/c pt home monitor has not updated in at least 14 days. No answer and unable to leave a message.

## 2015-12-17 ENCOUNTER — Telehealth: Payer: Self-pay | Admitting: Internal Medicine

## 2015-12-17 NOTE — Telephone Encounter (Signed)
F/u  Pt stated we should have received remote transmission- as per last telephone note. Please call back and discuss.

## 2015-12-17 NOTE — Telephone Encounter (Signed)
Spoke with Donald [name]. Transmission received, no further intervention necessary.

## 2015-12-18 LAB — CUP PACEART REMOTE DEVICE CHECK: Date Time Interrogation Session: 20170701103722

## 2015-12-24 DIAGNOSIS — Z1211 Encounter for screening for malignant neoplasm of colon: Secondary | ICD-10-CM | POA: Diagnosis not present

## 2015-12-31 ENCOUNTER — Ambulatory Visit (INDEPENDENT_AMBULATORY_CARE_PROVIDER_SITE_OTHER): Payer: Medicare Other | Admitting: *Deleted

## 2015-12-31 DIAGNOSIS — I638 Other cerebral infarction: Secondary | ICD-10-CM

## 2015-12-31 DIAGNOSIS — I6389 Other cerebral infarction: Secondary | ICD-10-CM

## 2015-12-31 NOTE — Progress Notes (Signed)
Carelink Summary Report / Loop Recorder 

## 2016-01-07 ENCOUNTER — Ambulatory Visit (INDEPENDENT_AMBULATORY_CARE_PROVIDER_SITE_OTHER): Payer: Medicare Other | Admitting: Adult Health

## 2016-01-07 ENCOUNTER — Encounter: Payer: Self-pay | Admitting: Adult Health

## 2016-01-07 VITALS — BP 152/76 | HR 88 | Ht 75.0 in | Wt 222.8 lb

## 2016-01-07 DIAGNOSIS — R569 Unspecified convulsions: Secondary | ICD-10-CM | POA: Diagnosis not present

## 2016-01-07 DIAGNOSIS — Z8673 Personal history of transient ischemic attack (TIA), and cerebral infarction without residual deficits: Secondary | ICD-10-CM

## 2016-01-07 DIAGNOSIS — I639 Cerebral infarction, unspecified: Secondary | ICD-10-CM

## 2016-01-07 LAB — CUP PACEART REMOTE DEVICE CHECK: Date Time Interrogation Session: 20170731103725

## 2016-01-07 NOTE — Patient Instructions (Addendum)
Continue Eliquis Blood Pressure 130/90 Cholesterol LDL <100 hgbA1c <6.5 % Driver rehab [id number] Continue Keppra  If your symptoms worsen or you develop new symptoms please let us know.

## 2016-01-07 NOTE — Progress Notes (Addendum)
PATIENT: Donald [name] DOB: Jul 26, 1945  REASON FOR VISIT: follow up- stroke, seizures HISTORY FROM: patient  HISTORY OF PRESENT ILLNESS: Donald [name] is a 70 year old male with a history of stroke and seizures. He returns today for follow-up. He continues on Eliquis and is tolerating it well. His blood pressure is slightly elevated today. His primary care is managing his hypertension, hyperlipidemia and diabetes. He reports overall he is doing well. He uses a cane when ambulating. Went to PT had good benefit. Using AFO on brace on the left.Denies any falls. He continues on Keppra XR1000 mg at bedtime. He denies any seizure events. He operates a Teacher, music without difficulty. Patient is asking about driving. He returns today for an evaluation.  HISTORY 07/09/15:Donald [name]  Is a 70 year old male with a history of stroke and seizures. He returns today for follow-up. The patient was switched to Eliquis by his cardiologist after he was found to have atrial fibrillation in October. He is tolerating this medication well. The patient's blood pressure is elevated slightly today however this is consistent with every time he comes in to the doctor's office. He states that he has whitecoat syndrome. His primary care is managing his cholesterol  And diabetes. He denies any additional strokelike symptoms. He does not have any weakness from the stroke but does feel like his reaction time on the left side is slower. He uses a cane when  Ambulating.  The patient states that he has a hard time with  ambulatingup or down steps. He is wondering if physical therapy would be beneficial. He continues to use Keppra XR thousand milligrams at bedtime. He is not had any additional seizure events. He denies any new neurological symptoms. He returns today for an evaluation.  HISTORY 01/08/15: Donald [name] is a 70 year old male with a history of ischemic stroke and seizures. He returns today for follow-up. The  patient continues to take Plavix for stroke prevention. The patient's primary care manages his blood pressure, cholesterol and diabetes. Patient states that his blood pressure is slightly elevated today. However he relates this to "white coat syndrome." Patient states that he had a seizure in April at that time he was placed on Keppra XR 1000 mg at bedtime. He states that since then he has not had any seizures. He does not operate a motor vehicle. He is able to complete all ADLs independently. Patient states that after the stroke he has had some numbness in the bottom of the left foot and that affects his balance. He continues to use a cane when ambulating. Denies any recent falls. Denies any new neurological symptoms. He returns today for an evaluation.  HISTORY 07/05/14:Donald [name] is a 70 year old male with a history of stroke on 06/03/13. He returns today for follow-up. He is currently taking Plavix for stroke prevention. His HTN, cholesterol and Diabetes is managed by his PCP. He states that his BP has been controlled. When he checks it at home it is usually 130s/70s. His states that his last hemoglobin A1C was 5.9 % . He continues to take metformin- his dosage has recently been decreased. His cholesterol has been in normal range according to the patient and therefore medication has not been required. Patient will use a cane when he is out in public. He does not use it at home. He feels that his walking has improved. Denies any falls. He notes that his fine motor skills in the left hand has improved. He states that he is  now able to cook at home. He does have some sensory changes in the left hand and foot. Denies any new symptoms.   HISTORY 12/20/13 (Donald [name]): is an 70 y.o. male who was visiting here for the holidays on 06/03/2013. When he got up he was normal. As he was returning to bed began to be short of breath. Patient then was noted to have slurred speech by his wife and she noted a facial droop.  Patient seemed to be weak on the left a well. EMS was called and the patient was brought in as a code stroke. Initial NIHSS of 8. Patient was given TPA. No intervenable lesion was seen on CTA. He was admitted to the neuro ICU for further evaluation and treatment. CT angio head showed Subtle attenuation of the distal right superior MCA territory branch vessels as compared to the left. No proximal branch occlusion or high-grade flow-limiting stenosis identified within the right middle cerebral artery proximally. Otherwise unremarkable CTA of the head without evidence of occlusion, high-grade stenosis, or aneurysm elsewhere within the brain.  CT angio neck was a normal CTA of the neck without evidence of high-grade stenosis, occlusion, or dissection. 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 MRI of the brain showed an acute large right middle cerebral artery territory infarct. Minimal underlying petechial hemorrhage without lobar hematoma. 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 Minimal additional white matter changes suggest chronic small vessel ischemic disease. Acute on chronic paranasal sinusitis. 2D Echocardiogram with the estimated ejection fraction was in the range of 60% to 65%. He was [facility] to [facility]07[facility]2013 through 07/02/2013. He is completed home physical therapy and occupational therapy and is about to start going to outpatient neuro rehabilitation. He is making good progress with his rehabilitation and is very determined. He and his wife have moved in with her daughter here in [place]. He has established care with Dr. Orland Mustard and is seeing him every 3 weeks for adjustments to his hypertensive medications, as his hypertension has been very difficult to control since his 53s. He thinks he forgot to take his blood pressure medicine the day he had the stroke. His blood pressure is elevated office today at 161/90, he is  tolerating Plavix well without any significant bruising or bleeding. He is able to ambulate short distances with a hemiwalker and AFO brace on the left. He is having some spasticity in the left arm with clonus at times.     REVIEW OF SYSTEMS: Out of a complete 14 system review of symptoms, the patient complains only of the following symptoms, and all other reviewed systems are negative.  ALLERGIES: Allergies  Allergen Reactions  . Bidil [Isosorb Dinitrate-Hydralazine] Other (See Comments)    Lethargic,tired     HOME MEDICATIONS: Outpatient Medications Prior to Visit  Medication Sig Dispense Refill  . amLODipine (NORVASC) 10 MG tablet Take 1 tablet (10 mg total) by mouth daily. 30 tablet 1  . apixaban (ELIQUIS) 5 MG TABS tablet Take 1 tablet (5 mg total) by mouth 2 (two) times daily. 180 tablet 3  . Cholecalciferol (VITAMIN D-3) 1000 units CAPS Take by mouth daily.    . cloNIDine (CATAPRES) 0.3 MG tablet Take 0.3 mg by mouth 2 (two) times daily.    . hydrALAZINE (APRESOLINE) 10 MG tablet Take 10 mg by mouth daily.     . hydrochlorothiazide (HYDRODIURIL) 25 MG tablet Take 25 mg by mouth daily.    Marland Kitchen levETIRAcetam (KEPPRA XR) 500 MG  24 hr tablet Take 2 tablets by mouth at  bedtime 180 tablet 3  . lisinopril (PRINIVIL,ZESTRIL) 40 MG tablet Take 1 tablet (40 mg total) by mouth daily. 30 tablet 1  . metFORMIN (GLUCOPHAGE-XR) 750 MG 24 hr tablet Take 750 mg by mouth 2 (two) times daily.     . Multiple Vitamin (MULTIVITAMIN) capsule Take 1 capsule by mouth daily.    . potassium chloride SA (K-DUR,KLOR-CON) 20 MEQ tablet Take 20 mEq by mouth 3 (three) times daily.      No facility-administered medications prior to visit.     PAST MEDICAL HISTORY: Past Medical History:  Diagnosis Date  . Diabetes mellitus without complication ([place])   . Hyperlipemia   . Hypertension   . large right middle cerebral artery infarct, embolic [id number]   a. s/p IV tPA, b. source unknown, c. loop recorder placed    . Snoring 07/03/2015    PAST SURGICAL HISTORY: Past Surgical History:  Procedure Laterality Date  . LOOP RECORDER IMPLANT  06/07/2013   MDT LinQ implanted by Dr [name] for cryptogenic stroke  . LOOP RECORDER IMPLANT N/A 06/07/2013   Procedure: LOOP RECORDER IMPLANT;  Surgeon: [name], MD;  Location: [facility] CATH LAB;  Service: Cardiovascular;  Laterality: N/A;  . TEE WITHOUT CARDIOVERSION N/A 06/07/2013   Procedure: TRANSESOPHAGEAL ECHOCARDIOGRAM (TEE);  Surgeon: [name], MD;  Location: [facility] ENDOSCOPY;  Service: Cardiovascular;  Laterality: N/A;    FAMILY HISTORY: Family History  Problem Relation Age of Onset  . Dementia Mother   . Diabetes Mother   . Heart attack Father     SOCIAL HISTORY: Social History   Social History  . Marital status: Married    Spouse name: [name]  . Number of children: 2  . Years of education: College   Occupational History  . Retired [employer]   Social History Main Topics  . Smoking status: Former Research scientist (life sciences)  . Smokeless tobacco: Never Used     Comment: Quit 25 years ago.  . Alcohol use 0.6 oz/week    1 Glasses of wine per week     Comment: Rare  . Drug use: No  . Sexual activity: No   Other Topics Concern  . Not on file   Social History Narrative   Patient lives at home [name]).   Retired works part time 10 hours a week.   Right handed   Education college   Caffeine: one cup daily coffee   No soda or tea.      PHYSICAL EXAM  Vitals:   01/07/16 1514  BP: (!) 152/76  Pulse: 88  Weight: 222 lb 12.8 oz (101.1 kg)  Height: 6\' 3"  (1.905 m)   Body mass index is 27.85 kg/m.  Generalized: Well developed, in no acute distress   Neurological examination  Mentation: Alert oriented to time, place, history taking. Follows all commands speech and language fluent Cranial nerve II-XII: Pupils were equal round reactive to light. Extraocular movements were full, visual field were full on confrontational test. Facial  sensation and strength were normal. Uvula tongue midline. Head turning and shoulder shrug  were normal and symmetric. Motor: The motor testing reveals 5 over 5 strength of all 4 extremities. Good symmetric motor tone is noted throughout.  Sensory: Sensory testing is intact to soft touch on all 4 extremities. No evidence of extinction is noted.  Coordination: Cerebellar testing reveals good finger-nose-finger and heel-to-shin bilaterally.  Gait and station: Gait is normal. Tandem gait is normal. Romberg  is negative. No drift is seen.  Reflexes: Deep tendon reflexes are symmetric and normal bilaterally.   DIAGNOSTIC DATA (LABS, IMAGING, TESTING) - I reviewed patient records, labs, notes, testing and imaging myself where available.  Lab Results  Component Value Date   WBC 10.7 (H) 09/19/2014   HGB 13.9 09/19/2014   HCT 41.0 09/19/2014   MCV 92.3 09/19/2014   PLT 216 09/19/2014      Component Value Date/Time   NA 137 09/19/2014 1908   K 3.3 (L) 09/19/2014 1908   CL 99 09/19/2014 1908   CO2 20 09/19/2014 1856   GLUCOSE 131 (H) 09/19/2014 1908   BUN 18 09/19/2014 1908   CREATININE 1.00 09/19/2014 1908   CALCIUM 9.1 09/19/2014 1856   PROT 7.0 09/19/2014 1856   ALBUMIN 3.9 09/19/2014 1856   AST 24 09/19/2014 1856   ALT 13 09/19/2014 1856   ALKPHOS 58 09/19/2014 1856   BILITOT 1.1 09/19/2014 1856   GFRNONAA 65 (L) 09/19/2014 1856   GFRAA 76 (L) 09/19/2014 1856   Lab Results  Component Value Date   CHOL 139 09/20/2014   HDL 28 (L) 09/20/2014   LDLCALC 68 09/20/2014   TRIG 214 (H) 09/20/2014   CHOLHDL 5.0 09/20/2014   Lab Results  Component Value Date   HGBA1C 6.6 (H) 09/20/2014   No results found for: VITAMINB12 No results found for: TSH    ASSESSMENT AND PLAN 70 y.o. year old male  has a past medical history of Diabetes mellitus without complication ([place]); Hyperlipemia; Hypertension; large right middle cerebral artery infarct, embolic (123XX123); and Snoring (07/03/2015).  here with:  1. History of stroke 2. Seizures  Overall the patient is doing well. He will continue on Eliquis. He should maintain strict control of his blood pressure goal less than 130/90. Cholesterol LDL less than 100 and hemoglobin A1c less than 6.5%. He will continue on Keppra XR 1000 mg at bedtime. Advised that I recommend [facility] for recommendations on driving. I advised patient that if his symptoms worsen or he develops any new symptoms he should let us know. Follow-up in 6 months or sooner if needed.     Ward Givens, MSN, NP-C 01/07/2016, 3:26 PM [facility] [address] [place], [place] [address] [phone]  Personally  participated in and made any corrections needed to history, physical, neuro exam,assessment and plan as stated above.   Sarina Ill, MD [facility]

## 2016-01-11 DIAGNOSIS — I639 Cerebral infarction, unspecified: Secondary | ICD-10-CM | POA: Diagnosis not present

## 2016-01-11 DIAGNOSIS — I4891 Unspecified atrial fibrillation: Secondary | ICD-10-CM | POA: Diagnosis not present

## 2016-01-11 DIAGNOSIS — G40909 Epilepsy, unspecified, not intractable, without status epilepticus: Secondary | ICD-10-CM | POA: Diagnosis not present

## 2016-01-11 DIAGNOSIS — I10 Essential (primary) hypertension: Secondary | ICD-10-CM | POA: Diagnosis not present

## 2016-01-11 DIAGNOSIS — E119 Type 2 diabetes mellitus without complications: Secondary | ICD-10-CM | POA: Diagnosis not present

## 2016-01-11 DIAGNOSIS — Z7984 Long term (current) use of oral hypoglycemic drugs: Secondary | ICD-10-CM | POA: Diagnosis not present

## 2016-01-30 ENCOUNTER — Ambulatory Visit (INDEPENDENT_AMBULATORY_CARE_PROVIDER_SITE_OTHER): Payer: Medicare Other | Admitting: *Deleted

## 2016-01-30 DIAGNOSIS — I638 Other cerebral infarction: Secondary | ICD-10-CM

## 2016-01-30 DIAGNOSIS — I6389 Other cerebral infarction: Secondary | ICD-10-CM

## 2016-01-30 NOTE — Progress Notes (Signed)
Carelink Summary Report / Loop Recorder 

## 2016-02-23 LAB — CUP PACEART REMOTE DEVICE CHECK: Date Time Interrogation Session: 20170830103547

## 2016-02-23 NOTE — Progress Notes (Signed)
[facility] summary report received. Battery status OK. Normal device function. No new symptom episodes, tachy episodes, brady, or pause episodes. 100% AF, V rates controlled, +Eliquis. Monthly summary reports and ROV/PRN

## 2016-02-26 DIAGNOSIS — B309 Viral conjunctivitis, unspecified: Secondary | ICD-10-CM | POA: Diagnosis not present

## 2016-02-28 ENCOUNTER — Inpatient Hospital Stay (HOSPITAL_COMMUNITY): Admission: RE | Admit: 2016-02-28 | Payer: Medicare Other | Source: Ambulatory Visit | Admitting: Nurse Practitioner

## 2016-02-29 ENCOUNTER — Ambulatory Visit (INDEPENDENT_AMBULATORY_CARE_PROVIDER_SITE_OTHER): Payer: Medicare Other | Admitting: *Deleted

## 2016-02-29 DIAGNOSIS — I638 Other cerebral infarction: Secondary | ICD-10-CM

## 2016-02-29 DIAGNOSIS — S90222A Contusion of left lesser toe(s) with damage to nail, initial encounter: Secondary | ICD-10-CM | POA: Diagnosis not present

## 2016-02-29 DIAGNOSIS — R141 Gas pain: Secondary | ICD-10-CM | POA: Diagnosis not present

## 2016-02-29 DIAGNOSIS — B309 Viral conjunctivitis, unspecified: Secondary | ICD-10-CM | POA: Diagnosis not present

## 2016-02-29 DIAGNOSIS — I6389 Other cerebral infarction: Secondary | ICD-10-CM

## 2016-02-29 DIAGNOSIS — Z23 Encounter for immunization: Secondary | ICD-10-CM | POA: Diagnosis not present

## 2016-02-29 DIAGNOSIS — E119 Type 2 diabetes mellitus without complications: Secondary | ICD-10-CM | POA: Diagnosis not present

## 2016-02-29 DIAGNOSIS — I10 Essential (primary) hypertension: Secondary | ICD-10-CM | POA: Diagnosis not present

## 2016-02-29 NOTE — Progress Notes (Signed)
Carelink Summary Report / Loop Recorder 

## 2016-03-03 ENCOUNTER — Telehealth: Payer: Self-pay | Admitting: *Deleted

## 2016-03-03 ENCOUNTER — Other Ambulatory Visit: Payer: Self-pay

## 2016-03-03 ENCOUNTER — Telehealth: Payer: Self-pay | Admitting: Neurology

## 2016-03-03 MED ORDER — LEVETIRACETAM ER 500 MG PO TB24: ORAL_TABLET | ORAL | 0 refills | Status: DC

## 2016-03-03 NOTE — Telephone Encounter (Signed)
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

## 2016-03-03 NOTE — Telephone Encounter (Addendum)
Wife called to request refill of 500 MG Keppra ER (extended release) to [facility], states [facility] tried to contact Jinny Blossom, NP for refill of this medication on July 8th and July 17th. (I didn't see any documentation of refill requests around these dates). Wife states patient is down to 2 pills. Wife requests emergency Rx for this medication and override to [facility] Wife would like to speak to nurse about medication refills, please call [phone].

## 2016-03-03 NOTE — Telephone Encounter (Signed)
Error

## 2016-03-03 NOTE — Telephone Encounter (Signed)
Rn call patients wife [name] back about the donut hole issue with his insurance. Also about the keppra Rx. Pts wife stated she needs a temporary medication keppra sent to [facility] on [address] for 30 days. Also in about 3 weeks she needs the long term keppra sent to [facility]. Rn stated a request was never sent from [facility] to get a refill for keppra. [name] stated she does not need to discuss the donut hole. Pt stated she would hate to change to another seizure medication because he is doing so well. Pt had the insurance company on the other line to discuss different tier options. Pt is on eliquis that was prescribed by his cardiologist,and its costly per wife. Rn stated to wife that in 3 weeks another refill can be sent to [facility] for ongoing refills. Pts wife verbalized understanding.

## 2016-03-06 ENCOUNTER — Ambulatory Visit (HOSPITAL_COMMUNITY)
Admission: RE | Admit: 2016-03-06 | Discharge: 2016-03-06 | Disposition: A | Discharge status: Home / Self Care | Payer: Medicare Other | Source: Ambulatory Visit | Attending: Nurse Practitioner | Admitting: Nurse Practitioner

## 2016-03-06 ENCOUNTER — Encounter (HOSPITAL_COMMUNITY): Payer: Self-pay | Admitting: Nurse Practitioner

## 2016-03-06 VITALS — BP 156/100 | HR 89 | Ht 75.0 in | Wt 221.2 lb

## 2016-03-06 DIAGNOSIS — I4819 Other persistent atrial fibrillation: Secondary | ICD-10-CM

## 2016-03-06 DIAGNOSIS — I638 Other cerebral infarction: Secondary | ICD-10-CM

## 2016-03-06 DIAGNOSIS — E119 Type 2 diabetes mellitus without complications: Secondary | ICD-10-CM | POA: Diagnosis not present

## 2016-03-06 DIAGNOSIS — I10 Essential (primary) hypertension: Secondary | ICD-10-CM | POA: Insufficient documentation

## 2016-03-06 DIAGNOSIS — Z9889 Other specified postprocedural states: Secondary | ICD-10-CM | POA: Insufficient documentation

## 2016-03-06 DIAGNOSIS — Z7984 Long term (current) use of oral hypoglycemic drugs: Secondary | ICD-10-CM | POA: Diagnosis not present

## 2016-03-06 DIAGNOSIS — Z833 Family history of diabetes mellitus: Secondary | ICD-10-CM | POA: Insufficient documentation

## 2016-03-06 DIAGNOSIS — Z8249 Family history of ischemic heart disease and other diseases of the circulatory system: Secondary | ICD-10-CM | POA: Diagnosis not present

## 2016-03-06 DIAGNOSIS — Z87891 Personal history of nicotine dependence: Secondary | ICD-10-CM | POA: Diagnosis not present

## 2016-03-06 DIAGNOSIS — Z7901 Long term (current) use of anticoagulants: Secondary | ICD-10-CM | POA: Diagnosis not present

## 2016-03-06 DIAGNOSIS — E785 Hyperlipidemia, unspecified: Secondary | ICD-10-CM | POA: Diagnosis not present

## 2016-03-06 DIAGNOSIS — Z8673 Personal history of transient ischemic attack (TIA), and cerebral infarction without residual deficits: Secondary | ICD-10-CM | POA: Insufficient documentation

## 2016-03-06 DIAGNOSIS — B309 Viral conjunctivitis, unspecified: Secondary | ICD-10-CM | POA: Diagnosis not present

## 2016-03-06 DIAGNOSIS — I481 Persistent atrial fibrillation: Secondary | ICD-10-CM | POA: Diagnosis not present

## 2016-03-06 DIAGNOSIS — I6389 Other cerebral infarction: Secondary | ICD-10-CM

## 2016-03-06 NOTE — Progress Notes (Signed)
Electrophysiology Office Note Date: 03/06/2016  ID:  Donald [name], DOB December 27, 1945, MRN [id number]  PCP: London Pepper, MD Electrophysiologist: [name]  CC: afib  Donald [name] is a 70 y.o. male who presents today for routine electrophysiology followup. He underwent ILR implantation for stroke and had atrial fibrillation discovered. He has been started on Eliquis.  He continues to do well.  His exercise tolerance and energy are good.  He denies palpitations or any symptoms of AF.  He previously has had 3 second pauses on ILR for which he has been completely asymptomatic.  He has lost 60 lbs since his stroke! He is becoming more active and is considering driving.  He denies chest pain, palpitations, dyspnea, PND, orthopnea, nausea, vomiting, dizziness, syncope, edema, weight gain, or early satiety.  Past Medical History:  Diagnosis Date  . Diabetes mellitus without complication ([place])   . Hyperlipemia   . Hypertension   . large right middle cerebral artery infarct, embolic 123XX123   a. s/p IV tPA, b. source unknown, c. loop recorder placed  . Snoring 07/03/2015   Past Surgical History:  Procedure Laterality Date  . LOOP RECORDER IMPLANT  06/07/2013   MDT LinQ implanted by Dr [name] for cryptogenic stroke  . LOOP RECORDER IMPLANT N/A 06/07/2013   Procedure: LOOP RECORDER IMPLANT;  Surgeon: [name], MD;  Location: [place] CATH LAB;  Service: Cardiovascular;  Laterality: N/A;  . TEE WITHOUT CARDIOVERSION N/A 06/07/2013   Procedure: TRANSESOPHAGEAL ECHOCARDIOGRAM (TEE);  Surgeon: [name], MD;  Location: [facility] ENDOSCOPY;  Service: Cardiovascular;  Laterality: N/A;    Current Outpatient Prescriptions  Medication Sig Dispense Refill  . amLODipine (NORVASC) 10 MG tablet Take 1 tablet (10 mg total) by mouth daily. 30 tablet 1  . apixaban (ELIQUIS) 5 MG TABS tablet Take 1 tablet (5 mg total) by mouth 2 (two) times daily. 180 tablet 3  . Cholecalciferol (VITAMIN D-3) 1000  units CAPS Take by mouth daily.    . cloNIDine (CATAPRES) 0.3 MG tablet Take 0.3 mg by mouth 2 (two) times daily.    . hydrALAZINE (APRESOLINE) 10 MG tablet Take 10 mg by mouth daily.     . hydrochlorothiazide (HYDRODIURIL) 25 MG tablet Take 25 mg by mouth daily.    Marland Kitchen levETIRAcetam (KEPPRA XR) 500 MG 24 hr tablet Take 2 tablets by mouth at  bedtime 180 tablet 0  . lisinopril (PRINIVIL,ZESTRIL) 40 MG tablet Take 1 tablet (40 mg total) by mouth daily. 30 tablet 1  . metFORMIN (GLUCOPHAGE-XR) 750 MG 24 hr tablet Take 750 mg by mouth 2 (two) times daily.     . Multiple Vitamin (MULTIVITAMIN) capsule Take 1 capsule by mouth daily.    . potassium chloride SA (K-DUR,KLOR-CON) 20 MEQ tablet Take 20 mEq by mouth 3 (three) times daily.     . vitamin E 400 UNIT capsule Take 400 Units by mouth daily.     No current facility-administered medications for this encounter.     Allergies:   Bidil [isosorb dinitrate-hydralazine]   Social History: Social History   Social History  . Marital status: Married    Spouse name: [name]  . Number of children: 2  . Years of education: College   Occupational History  . Retired [employer]   Social History Main Topics  . Smoking status: Former Research scientist (life sciences)  . Smokeless tobacco: Never Used     Comment: Quit 25 years ago.  . Alcohol use 0.6 oz/week    1  Glasses of wine per week     Comment: Rare  . Drug use: No  . Sexual activity: No   Other Topics Concern  . Not on file   Social History Narrative   Patient lives at home [name]).   Retired works part time 10 hours a week.   Right handed   Education college   Caffeine: one cup daily coffee   No soda or tea.    Family History: Family History  Problem Relation Age of Onset  . Dementia Mother   . Diabetes Mother   . Heart attack Father     Review of Systems: All other systems reviewed and are otherwise negative except as noted above.   Physical Exam: VS:  BP (!) 156/100 (BP Location: Left  Arm, Patient Position: Sitting, Cuff Size: Normal)   Pulse 89   Ht 6\' 3"  (1.905 m)   Wt 221 lb 3.2 oz (100.3 kg)   BMI 27.65 kg/m  , BMI Body mass index is 27.65 kg/m. Wt Readings from Last 3 Encounters:  03/06/16 221 lb 3.2 oz (100.3 kg)  01/07/16 222 lb 12.8 oz (101.1 kg)  11/01/15 225 lb (102.1 kg)    GEN- The patient is well appearing, alert and oriented x 3 today.   HEENT: normocephalic, atraumatic; sclera clear, conjunctiva pink; hearing intact; oropharynx clear; neck supple  Lungs- Clear to ausculation bilaterally, normal work of breathing.  No wheezes, rales, rhonchi Heart- Irregular rate and rhythm  GI- soft, non-tender, non-distended, bowel sounds present  Extremities- no clubbing, cyanosis, or edema; DP/PT/radial pulses 2+ bilaterally MS- no significant deformity or atrophy Skin- warm and dry, no rash or lesion  Psych- euthymic mood, full affect Neuro- strength and sensation are intact   EKG:  EKG today reveals afib with V rate 89 bpm, septal infarct, nonspecific ST/T Changes  Assessment and Plan: 1.  Persistent atrial fibrillation The patient has asymptomatic persistent atrial fibrillation Continue Eliquis for CHADS2VASC of at least 5.  He remains on eliquis and doing well. Ventricular rates are well controlled by ILR remotes (reviewed today).   No changes are adivsed at this time  2.  HTN Elevated today He reports that typically his BP improves after a few minutes.  He is reluctant to make changes. Would consider restarting low dose coreg.  He previously had post termination pauses with AF (asymptomatic).  As he is now in afib 100% of the time, I think that this would be less of an issue.  3. Prior stroke Continue long term anticoagulation with eliquis.  Current medicines are reviewed at length with the patient today.   The patient does not have concerns regarding his medicines.  The following changes were made today:  none  Labs/ tests ordered today include:   Orders Placed This Encounter  Procedures  . EKG 12-Lead   Remote is followed with [facility] Disposition:   Follow up in AF clinic in 3 months    Signed, Thompson Grayer, MD  03/06/2016 5:28 PM

## 2016-03-19 ENCOUNTER — Other Ambulatory Visit: Payer: Self-pay

## 2016-03-19 MED ORDER — LEVETIRACETAM ER 500 MG PO TB24: ORAL_TABLET | ORAL | 2 refills | Status: DC

## 2016-03-31 ENCOUNTER — Ambulatory Visit (INDEPENDENT_AMBULATORY_CARE_PROVIDER_SITE_OTHER): Payer: Medicare Other | Admitting: *Deleted

## 2016-03-31 DIAGNOSIS — I638 Other cerebral infarction: Secondary | ICD-10-CM

## 2016-03-31 DIAGNOSIS — I6389 Other cerebral infarction: Secondary | ICD-10-CM

## 2016-04-01 NOTE — Progress Notes (Signed)
Carelink Summary Report / Loop Recorder 

## 2016-04-11 LAB — CUP PACEART REMOTE DEVICE CHECK
Date Time Interrogation Session: 20170929113633
Implantable Pulse Generator Implant Date: 20150106

## 2016-04-11 NOTE — Progress Notes (Signed)
[facility] summary report received. Battery status OK. Normal device function. No new symptom episodes, tachy episodes, brady, or pause episodes. 2 AF 100%. +Eliquis. Monthly summary reports and ROV/PRN

## 2016-04-14 DIAGNOSIS — I4891 Unspecified atrial fibrillation: Secondary | ICD-10-CM | POA: Diagnosis not present

## 2016-04-14 DIAGNOSIS — E119 Type 2 diabetes mellitus without complications: Secondary | ICD-10-CM | POA: Diagnosis not present

## 2016-04-14 DIAGNOSIS — I639 Cerebral infarction, unspecified: Secondary | ICD-10-CM | POA: Diagnosis not present

## 2016-04-14 DIAGNOSIS — G40909 Epilepsy, unspecified, not intractable, without status epilepticus: Secondary | ICD-10-CM | POA: Diagnosis not present

## 2016-04-14 DIAGNOSIS — I10 Essential (primary) hypertension: Secondary | ICD-10-CM | POA: Diagnosis not present

## 2016-04-14 DIAGNOSIS — Z7984 Long term (current) use of oral hypoglycemic drugs: Secondary | ICD-10-CM | POA: Diagnosis not present

## 2016-04-14 DIAGNOSIS — E876 Hypokalemia: Secondary | ICD-10-CM | POA: Diagnosis not present

## 2016-04-18 ENCOUNTER — Telehealth: Payer: Self-pay | Admitting: Internal Medicine

## 2016-04-18 NOTE — Telephone Encounter (Signed)
Patient aware that there are no samples available. He will call back next week.

## 2016-04-18 NOTE — Telephone Encounter (Addendum)
Pt's wife requesting samples of Eliquis, would like to pick up at her appt 04-23-16 with Allred

## 2016-04-29 ENCOUNTER — Ambulatory Visit (INDEPENDENT_AMBULATORY_CARE_PROVIDER_SITE_OTHER): Payer: Medicare Other | Admitting: *Deleted

## 2016-04-29 DIAGNOSIS — I638 Other cerebral infarction: Secondary | ICD-10-CM | POA: Diagnosis not present

## 2016-04-29 DIAGNOSIS — I6389 Other cerebral infarction: Secondary | ICD-10-CM

## 2016-04-30 NOTE — Progress Notes (Signed)
Carelink Summary Report / Loop Recorder 

## 2016-05-07 LAB — CUP PACEART REMOTE DEVICE CHECK
Date Time Interrogation Session: 20171029113619
Implantable Pulse Generator Implant Date: 20150106

## 2016-05-07 NOTE — Progress Notes (Signed)
[facility] summary report received. Battery status OK. Normal device function. Fair histograms. No new symptom episodes, tachy episodes, brady, or pause episodes. AF 100%   Monthly summary reports and ROV/PRN

## 2016-05-29 ENCOUNTER — Ambulatory Visit (INDEPENDENT_AMBULATORY_CARE_PROVIDER_SITE_OTHER): Payer: Medicare Other | Admitting: *Deleted

## 2016-05-29 DIAGNOSIS — I6389 Other cerebral infarction: Secondary | ICD-10-CM

## 2016-05-29 DIAGNOSIS — I638 Other cerebral infarction: Secondary | ICD-10-CM

## 2016-05-29 NOTE — Progress Notes (Signed)
Carelink Summary Report / Loop Recorder 

## 2016-06-04 ENCOUNTER — Telehealth: Payer: Self-pay | Admitting: Internal Medicine

## 2016-06-04 NOTE — Telephone Encounter (Signed)
New Message    Pt c/o medication issue: 1. Name of Medication: Eliquis 2. How are you currently taking this medication (dosage and times per day)? Twice a day 3. Are you having a reaction (difficulty breathing--STAT)?  no 4. What is your medication issue? Cant afford medication , needs dr allred to call for a tier exception [phone], then the insurance will drop it to a tier 2 so that its covered by his insurance.   Also would like to pick up samples on Friday

## 2016-06-06 ENCOUNTER — Telehealth: Payer: Self-pay

## 2016-06-06 NOTE — Telephone Encounter (Signed)
Tier exception for Eliquis 5mg  submitted to [facility].

## 2016-06-06 NOTE — Telephone Encounter (Signed)
Spoke with patient's wife today. Advised her I would try for a tier exception today. Also have provided Eliquis 5mg  samples, 2 boxes.

## 2016-06-09 LAB — CUP PACEART REMOTE DEVICE CHECK
Date Time Interrogation Session: 20171128123542
Implantable Pulse Generator Implant Date: 20150106

## 2016-06-12 ENCOUNTER — Ambulatory Visit (HOSPITAL_COMMUNITY)
Admission: RE | Admit: 2016-06-12 | Discharge: 2016-06-12 | Disposition: A | Discharge status: Home / Self Care | Payer: Medicare Other | Source: Ambulatory Visit | Attending: Nurse Practitioner | Admitting: Nurse Practitioner

## 2016-06-12 ENCOUNTER — Encounter (HOSPITAL_COMMUNITY): Payer: Self-pay | Admitting: Nurse Practitioner

## 2016-06-12 VITALS — BP 174/98 | HR 78 | Ht 75.0 in | Wt 222.2 lb

## 2016-06-12 DIAGNOSIS — I4821 Permanent atrial fibrillation: Secondary | ICD-10-CM

## 2016-06-12 DIAGNOSIS — I481 Persistent atrial fibrillation: Secondary | ICD-10-CM | POA: Insufficient documentation

## 2016-06-12 DIAGNOSIS — I482 Chronic atrial fibrillation: Secondary | ICD-10-CM | POA: Diagnosis not present

## 2016-06-12 DIAGNOSIS — R9431 Abnormal electrocardiogram [ECG] [EKG]: Secondary | ICD-10-CM | POA: Diagnosis not present

## 2016-06-12 NOTE — Progress Notes (Signed)
Primary Care Physician: London Pepper, MD Referring Physician: Dr. Loney Hering Donald [name] is a 71 y.o. male with a h/o ILR implantation for stroke and had atrial fibrillation discovered. He has been started on Eliquis.  He continues to do well.  His exercise tolerance and energy are good.  He denies palpitations or any symptoms of AF. He previously has had 3 second pauses on ILR for which he has been completely asymptomatic. He has lost 60 lbs since his stroke!  His BP has always been difficult to check and on presentation it is elevated. Recheck 158/78. He has not been checking at home and he will do so over the next couple of weeks. Dr. [name] mentioned in last note, maybe low dose BB may be helpful.  Today, he denies symptoms of palpitations, chest pain, shortness of breath, orthopnea, PND, lower extremity edema, dizziness, presyncope, syncope, or neurologic sequela. The patient is tolerating medications without difficulties and is otherwise without complaint today.   Past Medical History:  Diagnosis Date  . Diabetes mellitus without complication ([place])   . Hyperlipemia   . Hypertension   . large right middle cerebral artery infarct, embolic 123XX123   a. s/p IV tPA, b. source unknown, c. loop recorder placed  . Snoring 07/03/2015   Past Surgical History:  Procedure Laterality Date  . LOOP RECORDER IMPLANT  06/07/2013   MDT LinQ implanted by Dr [name] for cryptogenic stroke  . LOOP RECORDER IMPLANT N/A 06/07/2013   Procedure: LOOP RECORDER IMPLANT;  Surgeon: [name], MD;  Location: [place] CATH LAB;  Service: Cardiovascular;  Laterality: N/A;  . TEE WITHOUT CARDIOVERSION N/A 06/07/2013   Procedure: TRANSESOPHAGEAL ECHOCARDIOGRAM (TEE);  Surgeon: [name], MD;  Location[facility] ENDOSCOPY;  Service: Cardiovascular;  Laterality: N/A;    Current Outpatient Prescriptions  Medication Sig Dispense Refill  . amLODipine (NORVASC) 10 MG tablet Take 1 tablet (10 mg total) by mouth  daily. 30 tablet 1  . apixaban (ELIQUIS) 5 MG TABS tablet Take 1 tablet (5 mg total) by mouth 2 (two) times daily. 180 tablet 3  . Cholecalciferol (VITAMIN D-3) 1000 units CAPS Take by mouth daily.    . cloNIDine (CATAPRES) 0.3 MG tablet Take 0.3 mg by mouth 2 (two) times daily.    . hydrALAZINE (APRESOLINE) 10 MG tablet Take 10 mg by mouth daily.     . hydrochlorothiazide (HYDRODIURIL) 25 MG tablet Take 25 mg by mouth daily.    Marland Kitchen levETIRAcetam (KEPPRA XR) 500 MG 24 hr tablet Take 2 tablets by mouth at  bedtime 180 tablet 2  . lisinopril (PRINIVIL,ZESTRIL) 40 MG tablet Take 1 tablet (40 mg total) by mouth daily. 30 tablet 1  . metFORMIN (GLUCOPHAGE-XR) 750 MG 24 hr tablet Take 750 mg by mouth 2 (two) times daily.     . Multiple Vitamin (MULTIVITAMIN) capsule Take 1 capsule by mouth daily.    . potassium chloride SA (K-DUR,KLOR-CON) 20 MEQ tablet Take 20 mEq by mouth 3 (three) times daily.     . vitamin E 400 UNIT capsule Take 400 Units by mouth daily.     No current facility-administered medications for this encounter.     Allergies  Allergen Reactions  . Bidil [Isosorb Dinitrate-Hydralazine] Other (See Comments)    Lethargic,tired     Social History   Social History  . Marital status: Married    Spouse name: [name]  . Number of children: 2  . Years of education: College   Occupational History  .  Retired [employer]   Social History Main Topics  . Smoking status: Former Research scientist (life sciences)  . Smokeless tobacco: Never Used     Comment: Quit 25 years ago.  . Alcohol use 0.6 oz/week    1 Glasses of wine per week     Comment: Rare  . Drug use: No  . Sexual activity: No   Other Topics Concern  . Not on file   Social History Narrative   Patient lives at home [name]).   Retired works part time 10 hours a week.   Right handed   Education college   Caffeine: one cup daily coffee   No soda or tea.    Family History  Problem Relation Age of Onset  . Dementia Mother   .  Diabetes Mother   . Heart attack Father     ROS- All systems are reviewed and negative except as per the HPI above  Physical Exam: Vitals:   06/12/16 1452  BP: (!) 174/98  Pulse: 78  Weight: 222 lb 3.2 oz (100.8 kg)  Height: 6\' 3"  (1.905 m)   Wt Readings from Last 3 Encounters:  06/12/16 222 lb 3.2 oz (100.8 kg)  03/06/16 221 lb 3.2 oz (100.3 kg)  01/07/16 222 lb 12.8 oz (101.1 kg)    Labs: Lab Results  Component Value Date   NA 137 09/19/2014   K 3.3 (L) 09/19/2014   CL 99 09/19/2014   CO2 20 09/19/2014   GLUCOSE 131 (H) 09/19/2014   BUN 18 09/19/2014   CREATININE 1.00 09/19/2014   CALCIUM 9.1 09/19/2014   Lab Results  Component Value Date   INR 1.06 09/19/2014   Lab Results  Component Value Date   CHOL 139 09/20/2014   HDL 28 (L) 09/20/2014   LDLCALC 68 09/20/2014   TRIG 214 (H) 09/20/2014     GEN- The patient is well appearing, alert and oriented x 3 today.   Head- normocephalic, atraumatic Eyes-  Sclera clear, conjunctiva pink Ears- hearing intact Oropharynx- clear Neck- supple, no JVP Lymph- no cervical lymphadenopathy Lungs- Clear to ausculation bilaterally, normal work of breathing Heart-irregular rate and rhythm, no murmurs, rubs or gallops, PMI not laterally displaced GI- soft, NT, ND, + BS Extremities- no clubbing, cyanosis, or edema MS- no significant deformity or atrophy Skin- no rash or lesion Psych- euthymic mood, full affect Neuro- strength and sensation are intact  EKG-atrial fibrillation at 78 bpm, qrs int 88 ms, 426 ms Epic records reviewed    Assessment and Plan: 1. Permanent afib Is well rate controlled without rate control on board Continue with Eliquis for CHA2DS2VASc of at least 5. No changes advised  2. HTN He states elevated in MD office, better on recheck He will check BP's at home and if elevated, he will contact the office and will start low dose bb  He previously had post termination pauses with AF (asymptomatic).   As he is now in afib 100% of the time, I think that this would be less of an issue.  3. Prior stroke Continue anticoagulation long term  F/u with Dr.Allred in 3 months  Butch Penny C. Merrill Deanda, [facility] [address] [place], [place] [address] [phone]

## 2016-06-30 ENCOUNTER — Ambulatory Visit (INDEPENDENT_AMBULATORY_CARE_PROVIDER_SITE_OTHER): Payer: Medicare Other | Admitting: *Deleted

## 2016-06-30 DIAGNOSIS — I638 Other cerebral infarction: Secondary | ICD-10-CM

## 2016-06-30 DIAGNOSIS — I6389 Other cerebral infarction: Secondary | ICD-10-CM

## 2016-06-30 NOTE — Progress Notes (Signed)
Carelink Summary Report / Loop Recorder 

## 2016-07-14 ENCOUNTER — Encounter: Payer: Self-pay | Admitting: Neurology

## 2016-07-14 ENCOUNTER — Ambulatory Visit (INDEPENDENT_AMBULATORY_CARE_PROVIDER_SITE_OTHER): Payer: Medicare Other | Admitting: Neurology

## 2016-07-14 VITALS — BP 151/93 | HR 63 | Ht 75.0 in | Wt 221.0 lb

## 2016-07-14 DIAGNOSIS — I6529 Occlusion and stenosis of unspecified carotid artery: Secondary | ICD-10-CM | POA: Diagnosis not present

## 2016-07-14 DIAGNOSIS — G8110 Spastic hemiplegia affecting unspecified side: Secondary | ICD-10-CM

## 2016-07-14 LAB — CUP PACEART REMOTE DEVICE CHECK
Date Time Interrogation Session: 20171228130737
Implantable Pulse Generator Implant Date: 20150106

## 2016-07-14 NOTE — Patient Instructions (Addendum)
I had a long discussion with the patient and his wife with regards to his spastic hemiplegia and gait difficulty and recommend he try walking without his AFO to see if this helps him. If he continues to have walking problems may need referral to physical therapist in the future. Continue eliquis for stroke prevention due to atrial fibrillation and maintain strict control of hypertension but blood pressure goal below 130/90, lipids with LDL cholesterol goal below 70 mg percent and diabetes with hemoglobin A1c goal below 6.5%. Check follow-up carotid ultrasound study. Continue Keppra XR 1000 mg daily for seizure prophylaxis. He is doing well and has not had recurrent stroke or seizures for several years since I do not believe he needs further scheduled follow-up appointments with me. He may return back in the future only as necessary.

## 2016-07-14 NOTE — Progress Notes (Signed)
PATIENT: Donald [name] DOB: Jul 26, 1945  REASON FOR VISIT: follow up- stroke, seizures HISTORY FROM: patient  HISTORY OF PRESENT ILLNESS: Donald [name] is a 71 year old male with a history of stroke and seizures. He returns today for follow-up. He continues on Eliquis and is tolerating it well. His blood pressure is slightly elevated today. His primary care is managing his hypertension, hyperlipidemia and diabetes. He reports overall he is doing well. He uses a cane when ambulating. Went to PT had good benefit. Using AFO on brace on the left.Denies any falls. He continues on Keppra XR1000 mg at bedtime. He denies any seizure events. He operates a Teacher, music without difficulty. Patient is asking about driving. He returns today for an evaluation.  HISTORY 07/09/15:Donald [name]  Is a 71 year old male with a history of stroke and seizures. He returns today for follow-up. The patient was switched to Eliquis by his cardiologist after he was found to have atrial fibrillation in October. He is tolerating this medication well. The patient's blood pressure is elevated slightly today however this is consistent with every time he comes in to the doctor's office. He states that he has whitecoat syndrome. His primary care is managing his cholesterol  And diabetes. He denies any additional strokelike symptoms. He does not have any weakness from the stroke but does feel like his reaction time on the left side is slower. He uses a cane when  Ambulating.  The patient states that he has a hard time with  ambulatingup or down steps. He is wondering if physical therapy would be beneficial. He continues to use Keppra XR thousand milligrams at bedtime. He is not had any additional seizure events. He denies any new neurological symptoms. He returns today for an evaluation.  HISTORY 01/08/15: Donald [name] is a 71 year old male with a history of ischemic stroke and seizures. He returns today for follow-up. The  patient continues to take Plavix for stroke prevention. The patient's primary care manages his blood pressure, cholesterol and diabetes. Patient states that his blood pressure is slightly elevated today. However he relates this to "white coat syndrome." Patient states that he had a seizure in April at that time he was placed on Keppra XR 1000 mg at bedtime. He states that since then he has not had any seizures. He does not operate a motor vehicle. He is able to complete all ADLs independently. Patient states that after the stroke he has had some numbness in the bottom of the left foot and that affects his balance. He continues to use a cane when ambulating. Denies any recent falls. Denies any new neurological symptoms. He returns today for an evaluation.  HISTORY 07/05/14:Donald [name] is a 71 year old male with a history of stroke on 06/03/13. He returns today for follow-up. He is currently taking Plavix for stroke prevention. His HTN, cholesterol and Diabetes is managed by his PCP. He states that his BP has been controlled. When he checks it at home it is usually 130s/70s. His states that his last hemoglobin A1C was 5.9 % . He continues to take metformin- his dosage has recently been decreased. His cholesterol has been in normal range according to the patient and therefore medication has not been required. Patient will use a cane when he is out in public. He does not use it at home. He feels that his walking has improved. Denies any falls. He notes that his fine motor skills in the left hand has improved. He states that he is  now able to cook at home. He does have some sensory changes in the left hand and foot. Denies any new symptoms.   HISTORY 12/20/13 (Donald [name]): is an 71 y.o. male who was visiting here for the holidays on 06/03/2013. When he got up he was normal. As he was returning to bed began to be short of breath. Patient then was noted to have slurred speech by his wife and she noted a facial droop.  Patient seemed to be weak on the left a well. EMS was called and the patient was brought in as a code stroke. Initial NIHSS of 8. Patient was given TPA. No intervenable lesion was seen on CTA. He was admitted to the neuro ICU for further evaluation and treatment. CT angio head showed Subtle attenuation of the distal right superior MCA territory branch vessels as compared to the left. No proximal branch occlusion or high-grade flow-limiting stenosis identified within the right middle cerebral artery proximally. Otherwise unremarkable CTA of the head without evidence of occlusion, high-grade stenosis, or aneurysm elsewhere within the brain.  CT angio neck was a normal CTA of the neck without evidence of high-grade stenosis, occlusion, or dissection. 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 MRI of the brain showed an acute large right middle cerebral artery territory infarct. Minimal underlying petechial hemorrhage without lobar hematoma. 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 Minimal additional white matter changes suggest chronic small vessel ischemic disease. Acute on chronic paranasal sinusitis. 2D Echocardiogram with the estimated ejection fraction was in the range of 60% to 65%. He was admitted to [facility] 06/07/2013 through 07/02/2013. He is completed home physical therapy and occupational therapy and is about to start going to outpatient neuro rehabilitation. He is making good progress with his rehabilitation and is very determined. He and his wife have moved in with her daughter here in [place]. He has established care with Dr. Orland Mustard and is seeing him every 3 weeks for adjustments to his hypertensive medications, as his hypertension has been very difficult to control since his 24s. He thinks he forgot to take his blood pressure medicine the day he had the stroke. His blood pressure is elevated office today at 161/90, he is  tolerating Plavix well without any significant bruising or bleeding. He is able to ambulate short distances with a hemiwalker and AFO brace on the left. He is having some spasticity in the left arm with clonus at times.    Update 07/14/2016 : He returns for follow-up after last visit 6 months ago. He is a complaint by his wife. He states is doing well without recurrent stroke or TIA symptoms. He remains on eliquis which is tolerating well without bleeding or bruising. He states his blood pressure is usually well controlled at home in the 0000000 systolic range but does tend to go up when he visits physicians. Today it is 151/90 in office. He states his diabetes is well controlled and last him about an A1c was 5.9. He does not check his fasting sugars. He is tolerating Keppra XR 1000 mg daily well without side effects and states he is not had any seizures for more than 2 years. He still has some difficulty walking but wears an AFO and walks with stiffness of his left leg. He is a height but no falls or injuries. He has not had any follow-up carotid ultrasound done for more than 2 years.  REVIEW OF SYSTEMS: Out of a complete 14 system review  of symptoms, the patient complains only of the following symptoms, and all other reviewed systems are negative. Left foot drop, gait difficulty, left shoulder pain and all other systems negative ALLERGIES: Allergies  Allergen Reactions  . Bidil [Isosorb Dinitrate-Hydralazine] Other (See Comments)    Lethargic,tired     HOME MEDICATIONS: Outpatient Medications Prior to Visit  Medication Sig Dispense Refill  . amLODipine (NORVASC) 10 MG tablet Take 1 tablet (10 mg total) by mouth daily. 30 tablet 1  . apixaban (ELIQUIS) 5 MG TABS tablet Take 1 tablet (5 mg total) by mouth 2 (two) times daily. 180 tablet 3  . Cholecalciferol (VITAMIN D-3) 1000 units CAPS Take by mouth daily.    . cloNIDine (CATAPRES) 0.3 MG tablet Take 0.3 mg by mouth 2 (two) times daily.    .  hydrALAZINE (APRESOLINE) 10 MG tablet Take 10 mg by mouth daily.     . hydrochlorothiazide (HYDRODIURIL) 25 MG tablet Take 25 mg by mouth daily.    Marland Kitchen levETIRAcetam (KEPPRA XR) 500 MG 24 hr tablet Take 2 tablets by mouth at  bedtime 180 tablet 2  . lisinopril (PRINIVIL,ZESTRIL) 40 MG tablet Take 1 tablet (40 mg total) by mouth daily. 30 tablet 1  . metFORMIN (GLUCOPHAGE-XR) 750 MG 24 hr tablet Take 750 mg by mouth 2 (two) times daily.     . Multiple Vitamin (MULTIVITAMIN) capsule Take 1 capsule by mouth daily.    . potassium chloride SA (K-DUR,KLOR-CON) 20 MEQ tablet Take 20 mEq by mouth 3 (three) times daily.     . vitamin E 400 UNIT capsule Take 400 Units by mouth daily.     No facility-administered medications prior to visit.     PAST MEDICAL HISTORY: Past Medical History:  Diagnosis Date  . Diabetes mellitus without complication ([place])   . Hyperlipemia   . Hypertension   . large right middle cerebral artery infarct, embolic 123XX123   a. s/p IV tPA, b. source unknown, c. loop recorder placed  . Snoring 07/03/2015    PAST SURGICAL HISTORY: Past Surgical History:  Procedure Laterality Date  . LOOP RECORDER IMPLANT  06/07/2013   MDT LinQ implanted by Dr [name] for cryptogenic stroke  . LOOP RECORDER IMPLANT N/A 06/07/2013   Procedure: LOOP RECORDER IMPLANT;  Surgeon: [name], MD;  Location: [facility] CATH LAB;  Service: Cardiovascular;  Laterality: N/A;  . TEE WITHOUT CARDIOVERSION N/A 06/07/2013   Procedure: TRANSESOPHAGEAL ECHOCARDIOGRAM (TEE);  Surgeon: [name], MD;  Location: [facility] ENDOSCOPY;  Service: Cardiovascular;  Laterality: N/A;    FAMILY HISTORY: Family History  Problem Relation Age of Onset  . Dementia Mother   . Diabetes Mother   . Heart attack Father     SOCIAL HISTORY: Social History   Social History  . Marital status: Married    Spouse name: [name]  . Number of children: 2  . Years of education: College   Occupational History  . Retired [employer]   Social History Main Topics  . Smoking status: Former Research scientist (life sciences)  . Smokeless tobacco: Never Used     Comment: Quit 25 years ago.  . Alcohol use 0.6 oz/week    1 Glasses of wine per week     Comment: Rare  . Drug use: No  . Sexual activity: No   Other Topics Concern  . Not on file   Social History Narrative   Patient lives at home [name]).   Retired works part time 10 hours a week.   Right  handed   Education college   Caffeine: one cup daily coffee   No soda or tea.      PHYSICAL EXAM  Vitals:   07/14/16 1621  BP: (!) 151/93  Pulse: 63  Weight: 221 lb (100.2 kg)  Height: 6\' 3"  (1.905 m)   Body mass index is 27.62 kg/m.  Generalized:  Pleasant middle-age Caucasian male not in distress. . Afebrile. Head is nontraumatic. Neck is supple without bruit.    Cardiac exam no murmur or gallop. Lungs are clear to auscultation. Distal pulses are well felt.  Neurological examination  Mentation: Alert oriented to time, place, history taking. Follows all commands speech and language fluent Cranial nerve II-XII: Pupils were equal round reactive to light. Extraocular movements were full, visual field were full on confrontational test. Facial sensation and strength were normal. Uvula tongue midline. Head turning and shoulder shrug  were normal and symmetric. Motor: The motor testing reveals 5 over 5 strength of all 4 extremities. Increased tone left leg  . Slight left foot drop. Sensory: Sensory testing is intact to soft touch on all 4 extremities. No evidence of extinction is noted.  Coordination: Cerebellar testing reveals good finger-nose-finger and heel-to-shin bilaterally.  Gait and station: Gait is spastic hemiplegic with left foot drop and wearing AFO  Reflexes: Deep tendon reflexes are asymmetric and brisker on the left side  DIAGNOSTIC DATA (LABS, IMAGING, TESTING) - I reviewed patient records, labs, notes, testing and imaging myself where available.  Lab  Results  Component Value Date   WBC 10.7 (H) 09/19/2014   HGB 13.9 09/19/2014   HCT 41.0 09/19/2014   MCV 92.3 09/19/2014   PLT 216 09/19/2014      Component Value Date/Time   NA 137 09/19/2014 1908   K 3.3 (L) 09/19/2014 1908   CL 99 09/19/2014 1908   CO2 20 09/19/2014 1856   GLUCOSE 131 (H) 09/19/2014 1908   BUN 18 09/19/2014 1908   CREATININE 1.00 09/19/2014 1908   CALCIUM 9.1 09/19/2014 1856   PROT 7.0 09/19/2014 1856   ALBUMIN 3.9 09/19/2014 1856   AST 24 09/19/2014 1856   ALT 13 09/19/2014 1856   ALKPHOS 58 09/19/2014 1856   BILITOT 1.1 09/19/2014 1856   GFRNONAA 65 (L) 09/19/2014 1856   GFRAA 76 (L) 09/19/2014 1856   Lab Results  Component Value Date   CHOL 139 09/20/2014   HDL 28 (L) 09/20/2014   LDLCALC 68 09/20/2014   TRIG 214 (H) 09/20/2014   CHOLHDL 5.0 09/20/2014   Lab Results  Component Value Date   HGBA1C 6.6 (H) 09/20/2014   No results found for: VITAMINB12 No results found for: TSH    ASSESSMENT AND PLAN 71 y.o. year old male  has a past medical history of Diabetes mellitus without complication ([place]); Hyperlipemia; Hypertension; large right middle cerebral artery infarct, embolic (123XX123); and Snoring (07/03/2015). here with:  1. 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 Seizures stable   I had a long discussion with the patient and his wife with regards to his spastic hemiplegia and gait difficulty and recommend he try walking without his AFO to see if this helps him. If he continues to have walking problems may need referral to physical therapist in the future. Continue eliquis for stroke prevention due to atrial fibrillation and maintain strict control of hypertension but blood pressure goal below 130/90, lipids with LDL cholesterol goal below 70 mg percent and diabetes with hemoglobin A1c goal below 6.5%.  Check follow-up carotid ultrasound study. Continue Keppra XR 1000 mg daily for seizure prophylaxis. He is doing well  and has not had recurrent stroke or seizures for several years since I do not believe he needs further scheduled follow-up appointments with me. Greater than 50% time during this 25 minute visit was spent on counseling and coordination of care about his spastic gait, left foot drop, seizures and stroke risk.He may return back in the future only as necessary.   Antony Contras, MD 07/14/2016, 6:20 PM [facility] [address] [place], [place] [address] [phone]  Personally  participated in and made any corrections needed to history, physical, neuro exam,assessment and plan as stated above.   Sarina Ill, MD [facility]

## 2016-07-24 ENCOUNTER — Ambulatory Visit (INDEPENDENT_AMBULATORY_CARE_PROVIDER_SITE_OTHER): Payer: Medicare Other

## 2016-07-24 DIAGNOSIS — I6529 Occlusion and stenosis of unspecified carotid artery: Secondary | ICD-10-CM

## 2016-07-25 ENCOUNTER — Telehealth: Payer: Self-pay | Admitting: *Deleted

## 2016-07-25 LAB — CUP PACEART REMOTE DEVICE CHECK
Date Time Interrogation Session: 20180127130923
Implantable Pulse Generator Implant Date: 20150106

## 2016-07-25 NOTE — Telephone Encounter (Signed)
LMOVM requesting call back.  [facility] number for return call.  LINQ at [facility] as of 07/09/16.  Will offer appointment with Dr. [name] to discuss ILR explant/plan.

## 2016-07-25 NOTE — Telephone Encounter (Signed)
Patient and wife returned call.  Patient verbalizes understanding that LINQ is at RRT.  He is agreeable to scheduling appointment with Dr. [name] (from [id number]) for 09/15/16 at 2:00pm.  Patient verbalizes understanding that he should receive a return kit for his home monitor in 7-10 business days.  Patient and wife are appreciative of call and deny additional questions or concerns at this time.

## 2016-07-31 DIAGNOSIS — G40909 Epilepsy, unspecified, not intractable, without status epilepticus: Secondary | ICD-10-CM | POA: Diagnosis not present

## 2016-07-31 DIAGNOSIS — I10 Essential (primary) hypertension: Secondary | ICD-10-CM | POA: Diagnosis not present

## 2016-07-31 DIAGNOSIS — R7309 Other abnormal glucose: Secondary | ICD-10-CM | POA: Diagnosis not present

## 2016-07-31 DIAGNOSIS — I639 Cerebral infarction, unspecified: Secondary | ICD-10-CM | POA: Diagnosis not present

## 2016-07-31 DIAGNOSIS — E119 Type 2 diabetes mellitus without complications: Secondary | ICD-10-CM | POA: Diagnosis not present

## 2016-07-31 DIAGNOSIS — I4891 Unspecified atrial fibrillation: Secondary | ICD-10-CM | POA: Diagnosis not present

## 2016-07-31 DIAGNOSIS — E876 Hypokalemia: Secondary | ICD-10-CM | POA: Diagnosis not present

## 2016-07-31 DIAGNOSIS — Z7984 Long term (current) use of oral hypoglycemic drugs: Secondary | ICD-10-CM | POA: Diagnosis not present

## 2016-08-06 ENCOUNTER — Telehealth: Payer: Self-pay

## 2016-08-06 NOTE — Telephone Encounter (Signed)
-----   Message from Garvin Fila, MD sent at 08/06/2016  2:24 PM EST ----- Kindly inform the patient that carotid ultrasound study was normal

## 2016-08-06 NOTE — Telephone Encounter (Signed)
Rn call patient that the carotid ultrasound was normal. PT verbalized understanding.

## 2016-08-07 DIAGNOSIS — H938X9 Other specified disorders of ear, unspecified ear: Secondary | ICD-10-CM | POA: Diagnosis not present

## 2016-08-07 DIAGNOSIS — H6121 Impacted cerumen, right ear: Secondary | ICD-10-CM | POA: Diagnosis not present

## 2016-08-15 DIAGNOSIS — H6121 Impacted cerumen, right ear: Secondary | ICD-10-CM | POA: Diagnosis not present

## 2016-08-19 ENCOUNTER — Other Ambulatory Visit: Payer: Self-pay | Admitting: Internal Medicine

## 2016-08-27 ENCOUNTER — Other Ambulatory Visit: Payer: Self-pay | Admitting: Adult Health

## 2016-09-15 ENCOUNTER — Encounter: Payer: Self-pay | Admitting: Internal Medicine

## 2016-09-15 ENCOUNTER — Ambulatory Visit (INDEPENDENT_AMBULATORY_CARE_PROVIDER_SITE_OTHER): Payer: Medicare Other | Admitting: Internal Medicine

## 2016-09-15 VITALS — BP 140/78 | HR 88 | Ht 75.0 in | Wt 221.0 lb

## 2016-09-15 DIAGNOSIS — I6529 Occlusion and stenosis of unspecified carotid artery: Secondary | ICD-10-CM

## 2016-09-15 DIAGNOSIS — I482 Chronic atrial fibrillation: Secondary | ICD-10-CM | POA: Diagnosis not present

## 2016-09-15 DIAGNOSIS — I10 Essential (primary) hypertension: Secondary | ICD-10-CM | POA: Diagnosis not present

## 2016-09-15 DIAGNOSIS — I4821 Permanent atrial fibrillation: Secondary | ICD-10-CM

## 2016-09-15 LAB — CUP PACEART INCLINIC DEVICE CHECK
Date Time Interrogation Session: 20180416162643
Implantable Pulse Generator Implant Date: 20150106

## 2016-09-15 NOTE — Progress Notes (Signed)
Electrophysiology Office Note Date: 09/15/2016  ID:  Donald [name], DOB [birth year], MRN [id number]  PCP: London Pepper, MD Electrophysiologist: Donald [name]  CC: [id number]  Donald [name] is a 71 y.o. male who presents today for routine electrophysiology followup. His ILR has reached EOS.  He is otherwise doing well.  He is becoming more active.  He denies chest pain, palpitations, dyspnea, PND, orthopnea, nausea, vomiting, dizziness, syncope, edema, weight gain, or early satiety.  Past Medical History:  Diagnosis Date  . Diabetes mellitus without complication ([place])   . Hyperlipemia   . Hypertension   . [name] right middle cerebral artery infarct, embolic 10/03/84   a. s/p IV tPA, b. source unknown, c. loop recorder placed  . Snoring 07/03/2015   Past Surgical History:  Procedure Laterality Date  . LOOP RECORDER IMPLANT  06/07/2013   MDT LinQ implanted by Dr Donald [name] for cryptogenic stroke  . LOOP RECORDER IMPLANT N/A 06/07/2013   Procedure: LOOP RECORDER IMPLANT;  Surgeon: Donald Mark, MD;  Location: [facility] CATH LAB;  Service: Cardiovascular;  Laterality: N/A;  . TEE WITHOUT CARDIOVERSION N/A 06/07/2013   Procedure: TRANSESOPHAGEAL ECHOCARDIOGRAM (TEE);  Surgeon: Donald Spark, MD;  Location: [facility] ENDOSCOPY;  Service: Cardiovascular;  Laterality: N/A;    Current Outpatient Prescriptions  Medication Sig Dispense Refill  . amLODipine (NORVASC) 10 MG tablet Take 1 tablet (10 mg total) by mouth daily. 30 tablet 1  . apixaban (ELIQUIS) 5 MG TABS tablet Take 1 tablet (5 mg total) by mouth 2 (two) times daily. 180 tablet 3  . Cholecalciferol (VITAMIN D-3) 1000 units CAPS Take by mouth daily.    . cloNIDine (CATAPRES) 0.3 MG tablet Take 0.3 mg by mouth 2 (two) times daily.    . hydrALAZINE (APRESOLINE) 10 MG tablet Take 10 mg by mouth daily.     . hydrochlorothiazide (HYDRODIURIL) 25 MG tablet Take 25 mg by mouth daily.    Marland Kitchen levETIRAcetam (KEPPRA XR) 500 MG 24 hr tablet TAKE 2 TABLETS BY  MOUTH AT  BEDTIME 180 tablet 3  . lisinopril (PRINIVIL,ZESTRIL) 40 MG tablet Take 1 tablet (40 mg total) by mouth daily. 30 tablet 1  . metFORMIN (GLUCOPHAGE-XR) 750 MG 24 hr tablet Take 750 mg by mouth 2 (two) times daily.     . Multiple Vitamin (MULTIVITAMIN) capsule Take 1 capsule by mouth daily.    . potassium chloride SA (K-DUR,KLOR-CON) 20 MEQ tablet Take 20 mEq by mouth 3 (three) times daily.     . vitamin E 400 UNIT capsule Take 400 Units by mouth daily.     No current facility-administered medications for this visit.     Allergies:   Bidil [isosorb dinitrate-hydralazine]   Social History: Social History   Social History  . Marital status: Married    Spouse name: Donald [name]  . Number of children: 2  . Years of education: College   Occupational History  . Retired [employer]   Social History Main Topics  . Smoking status: Former Research scientist (life sciences)  . Smokeless tobacco: Never Used     Comment: Quit 25 years ago.  . Alcohol use 0.6 oz/week    1 Glasses of wine per week     Comment: Rare  . Drug use: No  . Sexual activity: No   Other Topics Concern  . Not on file   Social History Narrative   Patient lives at home Donald [name]).   Retired works part time 10 hours a week.   Right handed   Education  college   Caffeine: one cup daily coffee   No soda or tea.    Family History: Family History  Problem Relation Age of Onset  . Dementia Mother   . Diabetes Mother   . Heart attack Father     Review of Systems: All other systems reviewed and are otherwise negative except as noted above.   Physical Exam: VS:  BP 140/78   Pulse 88   Ht 6\' 3"  (1.905 m)   Wt 221 lb (100.2 kg)   SpO2 97%   BMI 27.62 kg/m  , BMI Body mass index is 27.62 kg/m. Wt Readings from Last 3 Encounters:  09/15/16 221 lb (100.2 kg)  07/14/16 221 lb (100.2 kg)  06/12/16 222 lb 3.2 oz (100.8 kg)    GEN- The patient is well appearing, alert and oriented x 3 today.   HEENT: normocephalic,  atraumatic; sclera clear, conjunctiva pink; hearing intact; oropharynx clear; neck supple  Lungs- Clear to ausculation bilaterally, normal work of breathing.  Heart- Irregular rate and rhythm  GI- soft, non-tender, non-distended, bowel sounds present  Extremities- no clubbing, cyanosis, or edema; DP/PT/radial pulses 2+ bilaterally MS- no significant deformity or atrophy Skin- warm and dry, no rash or lesion  Psych- euthymic mood, full affect Neuro- strength and sensation are intact   Assessment and Plan: 1.  Permanent atrial fibrillation The patient has asymptomatic persistent atrial fibrillation Continue Eliquis for CHADS2VASC of at least 5.  He remains on eliquis and doing well. Ventricular rates are well controlled by ILR remotes (reviewed today).   As his ILR has reached EOL (device interrogation personally reviewed today), we discussed device removal.  It is not bothering him and he would therefore prefer to leave it in place rather than have it removed.  If he decides to have it removed, he will contact my office at that time.  2.  HTN Stable No change required today  3. Prior stroke Continue long term anticoagulation with eliquis.  Current medicines are reviewed at length with the patient today.   The patient does not have concerns regarding his medicines.  The following changes were made today:  none  Return as needed  Signed, Thompson Grayer, MD  09/15/2016 2:26 PM

## 2016-09-15 NOTE — Patient Instructions (Signed)
Medication Instructions:  Your physician recommends that you continue on your current medications as directed. Please refer to the Current Medication list given to you today.  Labwork: None ordered.  Testing/Procedures: None ordered.  Follow-Up: Your physician recommends that you schedule a follow-up appointment as needed.   Any Other Special Instructions Will Be Listed Below (If Applicable).     If you need a refill on your cardiac medications before your next appointment, please call your pharmacy.   

## 2016-11-07 DIAGNOSIS — N529 Male erectile dysfunction, unspecified: Secondary | ICD-10-CM | POA: Diagnosis not present

## 2016-11-07 DIAGNOSIS — Z0000 Encounter for general adult medical examination without abnormal findings: Secondary | ICD-10-CM | POA: Diagnosis not present

## 2016-11-07 DIAGNOSIS — E119 Type 2 diabetes mellitus without complications: Secondary | ICD-10-CM | POA: Diagnosis not present

## 2016-11-07 DIAGNOSIS — I10 Essential (primary) hypertension: Secondary | ICD-10-CM | POA: Diagnosis not present

## 2016-11-07 DIAGNOSIS — I4891 Unspecified atrial fibrillation: Secondary | ICD-10-CM | POA: Diagnosis not present

## 2016-11-07 DIAGNOSIS — Z23 Encounter for immunization: Secondary | ICD-10-CM | POA: Diagnosis not present

## 2016-11-07 DIAGNOSIS — Z1211 Encounter for screening for malignant neoplasm of colon: Secondary | ICD-10-CM | POA: Diagnosis not present

## 2016-11-07 DIAGNOSIS — Z125 Encounter for screening for malignant neoplasm of prostate: Secondary | ICD-10-CM | POA: Diagnosis not present

## 2016-11-07 DIAGNOSIS — I639 Cerebral infarction, unspecified: Secondary | ICD-10-CM | POA: Diagnosis not present

## 2016-11-20 DIAGNOSIS — K5900 Constipation, unspecified: Secondary | ICD-10-CM | POA: Diagnosis not present

## 2016-11-20 DIAGNOSIS — R12 Heartburn: Secondary | ICD-10-CM | POA: Diagnosis not present

## 2016-11-20 DIAGNOSIS — M79609 Pain in unspecified limb: Secondary | ICD-10-CM | POA: Diagnosis not present

## 2016-11-20 DIAGNOSIS — R269 Unspecified abnormalities of gait and mobility: Secondary | ICD-10-CM | POA: Diagnosis not present

## 2016-11-20 DIAGNOSIS — E876 Hypokalemia: Secondary | ICD-10-CM | POA: Diagnosis not present

## 2016-11-20 DIAGNOSIS — Z8673 Personal history of transient ischemic attack (TIA), and cerebral infarction without residual deficits: Secondary | ICD-10-CM | POA: Diagnosis not present

## 2016-11-22 DIAGNOSIS — M25561 Pain in right knee: Secondary | ICD-10-CM | POA: Diagnosis not present

## 2016-11-25 DIAGNOSIS — M25561 Pain in right knee: Secondary | ICD-10-CM | POA: Diagnosis not present

## 2016-11-27 DIAGNOSIS — M25561 Pain in right knee: Secondary | ICD-10-CM | POA: Diagnosis not present

## 2016-11-28 ENCOUNTER — Other Ambulatory Visit: Payer: Self-pay | Admitting: Internal Medicine

## 2016-12-01 DIAGNOSIS — M25561 Pain in right knee: Secondary | ICD-10-CM | POA: Diagnosis not present

## 2016-12-22 DIAGNOSIS — E876 Hypokalemia: Secondary | ICD-10-CM | POA: Diagnosis not present

## 2017-01-01 DIAGNOSIS — M25561 Pain in right knee: Secondary | ICD-10-CM | POA: Diagnosis not present

## 2017-01-05 DIAGNOSIS — M25561 Pain in right knee: Secondary | ICD-10-CM | POA: Diagnosis not present

## 2017-01-08 DIAGNOSIS — M25561 Pain in right knee: Secondary | ICD-10-CM | POA: Diagnosis not present

## 2017-01-12 DIAGNOSIS — M25561 Pain in right knee: Secondary | ICD-10-CM | POA: Diagnosis not present

## 2017-01-14 ENCOUNTER — Ambulatory Visit: Payer: Medicare Other | Admitting: Neurology

## 2017-01-15 DIAGNOSIS — M25561 Pain in right knee: Secondary | ICD-10-CM | POA: Diagnosis not present

## 2017-01-29 ENCOUNTER — Ambulatory Visit: Payer: Medicare Other | Admitting: Neurology

## 2017-01-29 DIAGNOSIS — E119 Type 2 diabetes mellitus without complications: Secondary | ICD-10-CM | POA: Diagnosis not present

## 2017-01-29 DIAGNOSIS — I10 Essential (primary) hypertension: Secondary | ICD-10-CM | POA: Diagnosis not present

## 2017-01-29 DIAGNOSIS — S80819A Abrasion, unspecified lower leg, initial encounter: Secondary | ICD-10-CM | POA: Diagnosis not present

## 2017-01-29 DIAGNOSIS — R609 Edema, unspecified: Secondary | ICD-10-CM | POA: Diagnosis not present

## 2017-02-01 DIAGNOSIS — N289 Disorder of kidney and ureter, unspecified: Secondary | ICD-10-CM | POA: Diagnosis not present

## 2017-02-01 DIAGNOSIS — Z8673 Personal history of transient ischemic attack (TIA), and cerebral infarction without residual deficits: Secondary | ICD-10-CM | POA: Diagnosis not present

## 2017-02-01 DIAGNOSIS — I4891 Unspecified atrial fibrillation: Secondary | ICD-10-CM | POA: Diagnosis not present

## 2017-02-01 DIAGNOSIS — E119 Type 2 diabetes mellitus without complications: Secondary | ICD-10-CM | POA: Diagnosis not present

## 2017-02-01 DIAGNOSIS — G40909 Epilepsy, unspecified, not intractable, without status epilepticus: Secondary | ICD-10-CM | POA: Diagnosis not present

## 2017-02-01 DIAGNOSIS — I639 Cerebral infarction, unspecified: Secondary | ICD-10-CM | POA: Diagnosis not present

## 2017-02-01 DIAGNOSIS — R930 Abnormal findings on diagnostic imaging of skull and head, not elsewhere classified: Secondary | ICD-10-CM | POA: Diagnosis not present

## 2017-02-01 DIAGNOSIS — R404 Transient alteration of awareness: Secondary | ICD-10-CM | POA: Diagnosis not present

## 2017-02-01 DIAGNOSIS — R55 Syncope and collapse: Secondary | ICD-10-CM | POA: Diagnosis not present

## 2017-02-01 DIAGNOSIS — Z87891 Personal history of nicotine dependence: Secondary | ICD-10-CM | POA: Diagnosis not present

## 2017-02-01 DIAGNOSIS — Z7901 Long term (current) use of anticoagulants: Secondary | ICD-10-CM | POA: Diagnosis not present

## 2017-02-01 DIAGNOSIS — R531 Weakness: Secondary | ICD-10-CM | POA: Diagnosis not present

## 2017-02-01 DIAGNOSIS — I10 Essential (primary) hypertension: Secondary | ICD-10-CM | POA: Diagnosis not present

## 2017-02-02 DIAGNOSIS — I10 Essential (primary) hypertension: Secondary | ICD-10-CM | POA: Diagnosis not present

## 2017-02-02 DIAGNOSIS — Z8673 Personal history of transient ischemic attack (TIA), and cerebral infarction without residual deficits: Secondary | ICD-10-CM | POA: Diagnosis not present

## 2017-02-02 DIAGNOSIS — I4891 Unspecified atrial fibrillation: Secondary | ICD-10-CM | POA: Diagnosis not present

## 2017-02-02 DIAGNOSIS — R42 Dizziness and giddiness: Secondary | ICD-10-CM | POA: Diagnosis not present

## 2017-02-02 DIAGNOSIS — R55 Syncope and collapse: Secondary | ICD-10-CM | POA: Diagnosis not present

## 2017-02-02 DIAGNOSIS — E119 Type 2 diabetes mellitus without complications: Secondary | ICD-10-CM | POA: Diagnosis not present

## 2017-02-24 DIAGNOSIS — R351 Nocturia: Secondary | ICD-10-CM | POA: Diagnosis not present

## 2017-02-24 DIAGNOSIS — N5201 Erectile dysfunction due to arterial insufficiency: Secondary | ICD-10-CM | POA: Diagnosis not present

## 2017-03-05 DIAGNOSIS — Z23 Encounter for immunization: Secondary | ICD-10-CM | POA: Diagnosis not present

## 2017-03-05 DIAGNOSIS — I4891 Unspecified atrial fibrillation: Secondary | ICD-10-CM | POA: Diagnosis not present

## 2017-03-05 DIAGNOSIS — I10 Essential (primary) hypertension: Secondary | ICD-10-CM | POA: Diagnosis not present

## 2017-03-05 DIAGNOSIS — R5383 Other fatigue: Secondary | ICD-10-CM | POA: Diagnosis not present

## 2017-03-05 DIAGNOSIS — E119 Type 2 diabetes mellitus without complications: Secondary | ICD-10-CM | POA: Diagnosis not present

## 2017-03-05 DIAGNOSIS — Z09 Encounter for follow-up examination after completed treatment for conditions other than malignant neoplasm: Secondary | ICD-10-CM | POA: Diagnosis not present

## 2017-03-26 ENCOUNTER — Other Ambulatory Visit: Payer: Self-pay

## 2017-03-26 ENCOUNTER — Telehealth: Payer: Self-pay | Admitting: Internal Medicine

## 2017-03-26 ENCOUNTER — Telehealth: Payer: Self-pay | Admitting: Neurology

## 2017-03-26 DIAGNOSIS — Z1211 Encounter for screening for malignant neoplasm of colon: Secondary | ICD-10-CM | POA: Diagnosis not present

## 2017-03-26 MED ORDER — LEVETIRACETAM ER 500 MG PO TB24: 1000.0000 mg | ORAL_TABLET | Freq: Every day | ORAL | 0 refills | Status: DC

## 2017-03-26 NOTE — Telephone Encounter (Signed)
Rn call patients wife that the 30 day refill for her husband keppra was sent to [facility] on [address].

## 2017-03-26 NOTE — Telephone Encounter (Signed)
New message      Patient calling the office for samples of medication:   1.  What medication and dosage are you requesting samples for? eliquis 6mg   2.  Are you currently out of this medication? Pt almost out. He states he is in the donut hole. (Patient left a message on my phone at check out)

## 2017-03-26 NOTE — Telephone Encounter (Signed)
Pt's wife request refill for levETIRAcetam (KEPPRA XR) 500 MG 24 hr tablet sent to [facility]. She said 30 day supply. Specialty pharmacy cannot get it to him in time. She said they have been travelling and forgot to order the RX, he will be out by Saturday.

## 2017-03-26 NOTE — Telephone Encounter (Signed)
Rn call patients wife about needing a 30 day refill of keppra sent to [facility]. Pts wife they were traveling and she forgot to call [facility] for a refill. Pts wife stated she wanted to do overnight delivery but [facility] stated it can take 2 to 3 days. Rn recommend to wife if she want to just have the rx of keppra to [facility] permanently so it will be more convenient. Pts wife stated "no its cheaper at [facility]. Rn stated the same issue in 03/2016 and she did not request the refill timely for [facility]. Rn advised wife to always call when pt has a month left of keppra so it can be mailed timely. Rn also advised she put a date on her calendar to call [facility] for his refills. PT's wife stated :"I cant promise you that by putting a set date to call [facility]." Pts wife requested 7 pills.. Rn stated a 30 day refill can be sent to [facility]. Pts wife verbalized understanding.

## 2017-03-26 NOTE — Telephone Encounter (Signed)
Spoke with patient and made him aware that I could place two weeks of samples at the front desk. I also informed him that we are unable to provide him with samples ongoing for the remainder of the year. Patient verbalized his understanding and appreciation.

## 2017-05-20 ENCOUNTER — Telehealth: Payer: Self-pay | Admitting: Internal Medicine

## 2017-05-20 NOTE — Telephone Encounter (Signed)
Spoke with patient's spouse (DPR on file), she states patient is in the donut hole for eliquis 5mg . Informed her that we have a 2 week supply and she can pick up at the front desk. She states that she will come in tomorrow. Patient verbalized understanding and thanked me for the call.

## 2017-05-20 NOTE — Telephone Encounter (Signed)
New message ° ° °Patient calling the office for samples of medication: ° ° °1.  What medication and dosage are you requesting samples for?ELIQUIS 5 MG TABS tablet ° °2.  Are you currently out of this medication? no ° ° ° ° ° °

## 2017-06-05 ENCOUNTER — Other Ambulatory Visit: Payer: Self-pay | Admitting: Internal Medicine

## 2017-06-05 ENCOUNTER — Telehealth: Payer: Self-pay | Admitting: Internal Medicine

## 2017-06-05 MED ORDER — APIXABAN 5 MG PO TABS: 5.0000 mg | ORAL_TABLET | Freq: Two times a day (BID) | ORAL | 0 refills | Status: DC

## 2017-06-05 NOTE — Telephone Encounter (Signed)
Called pt's wife Donald [name] back to inform her that I would be leaving 3 weeks supply of samples of Eliquis 5 mg tablets and patient assistant forms at the front desk to pick up and return the forms as soon as possible for pt to see if he could get assistant with getting his medication. I advised the wife that if she has any other problems, questions or concerns to call the office. Wife verbalized understanding.

## 2017-06-05 NOTE — Telephone Encounter (Signed)
Donald [name] is returning a call . Please call

## 2017-06-05 NOTE — Telephone Encounter (Signed)
Called pt's wife [name] back to inform her that I would be leaving 3 weeks supply of samples of Eliquis 5 mg tablets and patient assistant forms at the front desk to pick up and return the forms as soon as possible for pt to see if he could get assistant with getting his medication. I advised the wife that if she has any other problems, questions or concerns to call the office. Wife verbalized understanding.

## 2017-06-05 NOTE — Telephone Encounter (Signed)
Called pt and left message for pt to call back concerning his request for samples of Eliquis.

## 2017-06-05 NOTE — Telephone Encounter (Signed)
Patient calling the office for samples of medication: ° ° °1.  What medication and dosage are you requesting samples for? Eliquis 5 mg  ° °2.  Are you currently out of this medication? yes ° ° °

## 2017-06-08 ENCOUNTER — Telehealth: Payer: Self-pay

## 2017-06-08 DIAGNOSIS — Z7984 Long term (current) use of oral hypoglycemic drugs: Secondary | ICD-10-CM | POA: Diagnosis not present

## 2017-06-08 DIAGNOSIS — I10 Essential (primary) hypertension: Secondary | ICD-10-CM | POA: Diagnosis not present

## 2017-06-08 DIAGNOSIS — E119 Type 2 diabetes mellitus without complications: Secondary | ICD-10-CM | POA: Diagnosis not present

## 2017-06-08 DIAGNOSIS — R609 Edema, unspecified: Secondary | ICD-10-CM | POA: Diagnosis not present

## 2017-06-08 DIAGNOSIS — M25519 Pain in unspecified shoulder: Secondary | ICD-10-CM | POA: Diagnosis not present

## 2017-06-08 DIAGNOSIS — G40909 Epilepsy, unspecified, not intractable, without status epilepticus: Secondary | ICD-10-CM | POA: Diagnosis not present

## 2017-06-08 DIAGNOSIS — I4891 Unspecified atrial fibrillation: Secondary | ICD-10-CM | POA: Diagnosis not present

## 2017-06-08 DIAGNOSIS — H029 Unspecified disorder of eyelid: Secondary | ICD-10-CM | POA: Diagnosis not present

## 2017-06-08 DIAGNOSIS — E876 Hypokalemia: Secondary | ICD-10-CM | POA: Diagnosis not present

## 2017-06-08 DIAGNOSIS — K649 Unspecified hemorrhoids: Secondary | ICD-10-CM | POA: Diagnosis not present

## 2017-06-08 DIAGNOSIS — I639 Cerebral infarction, unspecified: Secondary | ICD-10-CM | POA: Diagnosis not present

## 2017-06-08 NOTE — Telephone Encounter (Addendum)
**Note De-Identified Kaylea Mounsey Obfuscation** The pt dropped off his [employer] pt assistance application without his 2018 proof of income (he sent his 2017) or a report from his pharmacy showing that he has paid 3% out of pocket for medications in 2019.  I called and spoke with both the pt and his wife and explained that we need his 2018 proof of income and 2019 out of pocket expense report from his pharmacy.  They both verbalized understanding and requested that I mailed them back all the information that they left here today along with a new BMS pt assistance application.  I placed all in the mail addressed to the pt.  I have completed the provider part of the BMS application for pt assistance with Eliquis and placed it in the yellow folder in the PA dept. It will need a written Eliquis RX as well as Dr Bonita Quin signature.

## 2017-06-11 ENCOUNTER — Other Ambulatory Visit: Payer: Self-pay | Admitting: Internal Medicine

## 2017-06-16 DIAGNOSIS — M25512 Pain in left shoulder: Secondary | ICD-10-CM | POA: Diagnosis not present

## 2017-06-16 DIAGNOSIS — M25612 Stiffness of left shoulder, not elsewhere classified: Secondary | ICD-10-CM | POA: Diagnosis not present

## 2017-06-16 DIAGNOSIS — I639 Cerebral infarction, unspecified: Secondary | ICD-10-CM | POA: Diagnosis not present

## 2017-06-23 DIAGNOSIS — M25612 Stiffness of left shoulder, not elsewhere classified: Secondary | ICD-10-CM | POA: Diagnosis not present

## 2017-06-23 DIAGNOSIS — M25512 Pain in left shoulder: Secondary | ICD-10-CM | POA: Diagnosis not present

## 2017-06-23 DIAGNOSIS — I639 Cerebral infarction, unspecified: Secondary | ICD-10-CM | POA: Diagnosis not present

## 2017-06-25 DIAGNOSIS — M25612 Stiffness of left shoulder, not elsewhere classified: Secondary | ICD-10-CM | POA: Diagnosis not present

## 2017-06-25 DIAGNOSIS — I639 Cerebral infarction, unspecified: Secondary | ICD-10-CM | POA: Diagnosis not present

## 2017-06-25 DIAGNOSIS — M25512 Pain in left shoulder: Secondary | ICD-10-CM | POA: Diagnosis not present

## 2017-06-30 DIAGNOSIS — M25512 Pain in left shoulder: Secondary | ICD-10-CM | POA: Diagnosis not present

## 2017-06-30 DIAGNOSIS — I639 Cerebral infarction, unspecified: Secondary | ICD-10-CM | POA: Diagnosis not present

## 2017-06-30 DIAGNOSIS — M25612 Stiffness of left shoulder, not elsewhere classified: Secondary | ICD-10-CM | POA: Diagnosis not present

## 2017-07-07 DIAGNOSIS — M25512 Pain in left shoulder: Secondary | ICD-10-CM | POA: Diagnosis not present

## 2017-07-07 DIAGNOSIS — I639 Cerebral infarction, unspecified: Secondary | ICD-10-CM | POA: Diagnosis not present

## 2017-07-07 DIAGNOSIS — M25612 Stiffness of left shoulder, not elsewhere classified: Secondary | ICD-10-CM | POA: Diagnosis not present

## 2017-07-09 DIAGNOSIS — M25512 Pain in left shoulder: Secondary | ICD-10-CM | POA: Diagnosis not present

## 2017-07-09 DIAGNOSIS — I639 Cerebral infarction, unspecified: Secondary | ICD-10-CM | POA: Diagnosis not present

## 2017-07-09 DIAGNOSIS — M25612 Stiffness of left shoulder, not elsewhere classified: Secondary | ICD-10-CM | POA: Diagnosis not present

## 2017-07-13 ENCOUNTER — Ambulatory Visit (INDEPENDENT_AMBULATORY_CARE_PROVIDER_SITE_OTHER): Payer: Medicare Other | Admitting: Neurology

## 2017-07-13 ENCOUNTER — Encounter: Payer: Self-pay | Admitting: Neurology

## 2017-07-13 VITALS — BP 150/93 | HR 56 | Wt 229.2 lb

## 2017-07-13 DIAGNOSIS — R569 Unspecified convulsions: Secondary | ICD-10-CM

## 2017-07-13 DIAGNOSIS — Z8673 Personal history of transient ischemic attack (TIA), and cerebral infarction without residual deficits: Secondary | ICD-10-CM | POA: Diagnosis not present

## 2017-07-13 MED ORDER — LEVETIRACETAM ER 500 MG PO TB24: 1000.0000 mg | ORAL_TABLET | Freq: Every day | ORAL | 12 refills | Status: DC

## 2017-07-13 NOTE — Patient Instructions (Signed)
I had a long d/w patient about his recent stroke, risk for recurrent stroke/TIAs, personally independently reviewed imaging studies and stroke evaluation results and answered questions.Continue Eliquis (apixaban) daily  for secondary stroke prevention and maintain strict control of hypertension with blood pressure goal below 130/90, diabetes with hemoglobin A1c goal below 6.5% and lipids with LDL cholesterol goal below 70 mg/dL. I also advised the patient to eat a healthy diet with plenty of whole grains, cereals, fruits and vegetables, exercise regularly and maintain ideal body weight Followup in the future with me as needed.  -continue keppra for seizure prevention - talk with PCP regarding if they can start writing prescription for you as you have been seizure free for several years -continue to control diabetes with diet and medication

## 2017-07-13 NOTE — Progress Notes (Signed)
PATIENT: Donald [name] DOB: [birth year]  REASON FOR VISIT: follow up- stroke, seizures HISTORY FROM: patient  HISTORY OF PRESENT ILLNESS: Mr. Donald [name] is a 72 year old male with a history of stroke and seizures. He returns today for follow-up. He continues on Eliquis and is tolerating it well. His blood pressure is slightly elevated today. His primary care is managing his hypertension, hyperlipidemia and diabetes. He reports overall he is doing well. He uses a cane when ambulating. Went to PT had good benefit. Using AFO on brace on the left.Denies any falls. He continues on Keppra XR1000 mg at bedtime. He denies any seizure events. He operates a Teacher, music without difficulty. Patient is asking about driving. He returns today for an evaluation.   HISTORY 07/09/15:Mr. Donald [name]  Is a 72 year old male with a history of stroke and seizures. He returns today for follow-up. The patient was switched to Eliquis by his cardiologist after he was found to have atrial fibrillation in October. He is tolerating this medication well. The patient's blood pressure is elevated slightly today however this is consistent with every time he comes in to the doctor's office. He states that he has whitecoat syndrome. His primary care is managing his cholesterol  And diabetes. He denies any additional strokelike symptoms. He does not have any weakness from the stroke but does feel like his reaction time on the left side is slower. He uses a cane when  Ambulating.  The patient states that he has a hard time with  ambulatingup or down steps. He is wondering if physical therapy would be beneficial. He continues to use Keppra XR thousand milligrams at bedtime. He is not had any additional seizure events. He denies any new neurological symptoms. He returns today for an evaluation.  HISTORY 01/08/15: Mr. Donald [name] is a 72 year old male with a history of ischemic stroke and seizures. He returns today for follow-up. The  patient continues to take Plavix for stroke prevention. The patient's primary care manages his blood pressure, cholesterol and diabetes. Patient states that his blood pressure is slightly elevated today. However he relates this to "white coat syndrome." Patient states that he had a seizure in April at that time he was placed on Keppra XR 1000 mg at bedtime. He states that since then he has not had any seizures. He does not operate a motor vehicle. He is able to complete all ADLs independently. Patient states that after the stroke he has had some numbness in the bottom of the left foot and that affects his balance. He continues to use a cane when ambulating. Denies any recent falls. Denies any new neurological symptoms. He returns today for an evaluation.  HISTORY 07/05/14:Mr. Donald [name] is a 72 year old male with a history of stroke on 06/03/13. He returns today for follow-up. He is currently taking Plavix for stroke prevention. His HTN, cholesterol and Diabetes is managed by his PCP. He states that his BP has been controlled. When he checks it at home it is usually 130s/70s. His states that his last hemoglobin A1C was 5.9 % . He continues to take metformin- his dosage has recently been decreased. His cholesterol has been in normal range according to the patient and therefore medication has not been required. Patient will use a cane when he is out in public. He does not use it at home. He feels that his walking has improved. Denies any falls. He notes that his fine motor skills in the left hand has improved. He states that he  is now able to cook at home. He does have some sensory changes in the left hand and foot. Denies any new symptoms.   HISTORY 12/20/13 (Donald [name]): is an 72 y.o. male who was visiting here for the holidays on 06/03/2013. When he got up he was normal. As he was returning to bed began to be short of breath. Patient then was noted to have slurred speech by his wife and she noted a facial droop.  Patient seemed to be weak on the left a well. EMS was called and the patient was brought in as a code stroke. Initial NIHSS of 8. Patient was given TPA. No intervenable lesion was seen on CTA. He was admitted to the neuro ICU for further evaluation and treatment. CT angio head showed Subtle attenuation of the distal right superior MCA territory branch vessels as compared to the left. No proximal branch occlusion or high-grade flow-limiting stenosis identified within the right middle cerebral artery proximally. Otherwise unremarkable CTA of the head without evidence of occlusion, high-grade stenosis, or aneurysm elsewhere within the brain.  CT angio neck was a normal CTA of the neck without evidence of high-grade stenosis, occlusion, or dissection. 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 MRI of the brain showed an acute large right middle cerebral artery territory infarct. Minimal underlying petechial hemorrhage without lobar hematoma. 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 Minimal additional white matter changes suggest chronic small vessel ischemic disease. Acute on chronic paranasal sinusitis. 2D Echocardiogram with the estimated ejection fraction was in the range of 60% to 65%. He was admitted to [facility] 06/07/2013 through 07/02/2013. He is completed home physical therapy and occupational therapy and is about to start going to outpatient neuro rehabilitation. He is making good progress with his rehabilitation and is very determined. He and his wife have moved in with her daughter here in [place]. He has established care with Dr. Orland Mustard and is seeing him every 3 weeks for adjustments to his hypertensive medications, as his hypertension has been very difficult to control since his 25s. He thinks he forgot to take his blood pressure medicine the day he had the stroke. His blood pressure is elevated office today at 161/90, he is  tolerating Plavix well without any significant bruising or bleeding. He is able to ambulate short distances with a hemiwalker and AFO brace on the left. He is having some spasticity in the left arm with clonus at times.    Update 07/14/2016 : He returns for follow-up after last visit 6 months ago. He is a complaint by his wife. He states is doing well without recurrent stroke or TIA symptoms. He remains on eliquis which is tolerating well without bleeding or bruising. He states his blood pressure is usually well controlled at home in the 4:09 systolic range but does tend to go up when he visits physicians. Today it is 151/90 in office. He states his diabetes is well controlled and last him about an A1c was 5.9. He does not check his fasting sugars. He is tolerating Keppra XR 1000 mg daily well without side effects and states he is not had any seizures for more than 2 years. He still has some difficulty walking but wears an AFO and walks with stiffness of his left leg. He is a height but no falls or injuries. He has not had any follow-up carotid ultrasound done for more than 2 years.  UPDATE 07/13/17: Patient returns today for 1 year follow  up with hx of seizures and stroke in 2015. Patient doing well without recurrent stroke or TIA symptoms. He remains on Eliquis for atrial fibrillation and tolerating well without side effects of bleeding or bruising. His blood pressure has been controlled well at home - at todays visit 150/93 which per patient, his blood pressure increases at the doctors office. Last seizure was 06/2014 and was placed on keppra at that time. Patient tolerating keppra XR1000 well without side effects. No symptoms of possible seizure activity since his initial seizure in 2016. He continues to have some spastic hemiplegia and walks with a cane   REVIEW OF SYSTEMS: Out of a complete 14 system review of symptoms, the patient complains only of the following symptoms, and all other reviewed systems  are negative. Diarrhea and all other systems negative  ALLERGIES: Allergies  Allergen Reactions  . Bitolterol   . Bidil [Isosorb Dinitrate-Hydralazine] Other (See Comments)    Lethargic,tired     HOME MEDICATIONS: Outpatient Medications Prior to Visit  Medication Sig Dispense Refill  . amLODipine (NORVASC) 10 MG tablet Take 1 tablet (10 mg total) by mouth daily. 30 tablet 1  . apixaban (ELIQUIS) 5 MG TABS tablet Take 1 tablet (5 mg total) by mouth 2 (two) times daily. 180 tablet 0  . Cholecalciferol (VITAMIN D-3) 1000 units CAPS Take by mouth daily.    . cloNIDine (CATAPRES) 0.3 MG tablet Take 0.3 mg by mouth 2 (two) times daily.    Marland Kitchen co-enzyme Q-10 30 MG capsule Take 30 mg by mouth 3 (three) times daily.    . hydrALAZINE (APRESOLINE) 10 MG tablet Take 10 mg by mouth daily.     . hydrochlorothiazide (HYDRODIURIL) 25 MG tablet Take 25 mg by mouth daily.    Marland Kitchen levETIRAcetam (KEPPRA XR) 500 MG 24 hr tablet Take 2 tablets (1,000 mg total) by mouth at bedtime. 60 tablet 0  . lisinopril (PRINIVIL,ZESTRIL) 40 MG tablet Take 1 tablet (40 mg total) by mouth daily. 30 tablet 1  . metFORMIN (GLUCOPHAGE-XR) 750 MG 24 hr tablet Take 500 mg by mouth 2 (two) times daily.     . Multiple Vitamin (MULTIVITAMIN) capsule Take 1 capsule by mouth daily.    . potassium chloride SA (K-DUR,KLOR-CON) 20 MEQ tablet Take 20 mEq by mouth 4 (four) times daily.     . vitamin E 400 UNIT capsule Take 400 Units by mouth daily.     No facility-administered medications prior to visit.     PAST MEDICAL HISTORY: Past Medical History:  Diagnosis Date  . Diabetes mellitus without complication ([place])   . Hyperlipemia   . Hypertension   . large right middle cerebral artery infarct, embolic 1/0/9323   a. s/p IV tPA, b. source unknown, c. loop recorder placed  . Snoring 07/03/2015    PAST SURGICAL HISTORY: Past Surgical History:  Procedure Laterality Date  . LOOP RECORDER IMPLANT  06/07/2013   MDT LinQ implanted by Dr  [name] for cryptogenic stroke  . LOOP RECORDER IMPLANT N/A 06/07/2013   Procedure: LOOP RECORDER IMPLANT;  Surgeon: [name], MD;  Location: [facility] CATH [facility];  Service: Cardiovascular;  Laterality: N/A;  . TEE WITHOUT CARDIOVERSION N/A 06/07/2013   Procedure: TRANSESOPHAGEAL ECHOCARDIOGRAM (TEE);  Surgeon: [name], MD;  Location: [facility] ENDOSCOPY;  Service: Cardiovascular;  Laterality: N/A;    FAMILY HISTORY: Family History  Problem Relation Age of Onset  . Dementia Mother   . Diabetes Mother   . Heart attack Father  SOCIAL HISTORY: Social History   Socioeconomic History  . Marital status: Married    Spouse name: [name]  . Number of children: 2  . Years of education: College  . Highest education level: Not on file  Social Needs  . Financial resource strain: Not on file  . Food insecurity - worry: Not on file  . Food insecurity - inability: Not on file  . Transportation needs - medical: Not on file  . Transportation needs - non-medical: Not on file  Occupational History  . Occupation: Retired    Fish farm manager: Psychologist, educational  Tobacco Use  . Smoking status: Former Research scientist (life sciences)  . Smokeless tobacco: Never Used  . Tobacco comment: Quit 25 years ago.  Substance and Sexual Activity  . Alcohol use: Yes    Alcohol/week: 0.6 oz    Types: 1 Glasses of wine per week    Comment: Rare  . Drug use: No  . Sexual activity: No  Other Topics Concern  . Not on file  Social History Narrative   Patient lives at home [name]).   Retired works part time 10 hours a week.   Right handed   Education college   Caffeine: one cup daily coffee   No soda or tea.      PHYSICAL EXAM  Vitals:   07/13/17 1627  BP: (!) 150/93  Pulse: (!) 56  Weight: 229 lb 3.2 oz (104 kg)   Body mass index is 28.65 kg/m.  Generalized:  Pleasant middle-age Caucasian male not in distress. . Afebrile. Head is nontraumatic. Neck is supple without bruit.    Cardiac exam no murmur or gallop. Lungs are  clear to auscultation. Distal pulses are well felt.  Neurological examination  Mentation: Alert oriented to time, place, history taking. Follows all commands speech and language fluent Cranial nerve II-XII: Pupils were equal round reactive to light. Extraocular movements were full, visual field were full on confrontational test. Facial sensation and strength were normal. Uvula tongue midline. Head turning and shoulder shrug  were normal and symmetric. Motor: The motor testing reveals 5 over 5 strength of all 4 extremities. Increased tone left leg and left arm. Mild weakness in left hip flexor. And left hand and intrinsic hand muscles  Sensory: Sensory testing is intact to soft touch on all 4 extremities. No evidence of extinction is noted.  Coordination: Cerebellar testing reveals dysmetric finger to nose with left hand. Orbiting with right over left. Decreased rapid finger movements on left side. Gait and station: Gait is cautious and slow - uses cane at all times. With slight dragging of the left foot Reflexes: Deep tendon reflexes are asymmetric and brisker on the left side  DIAGNOSTIC DATA (LABS, IMAGING, TESTING) - I reviewed patient records, labs, notes, testing and imaging myself where available.  Lab Results  Component Value Date   WBC 10.7 (H) 09/19/2014   HGB 13.9 09/19/2014   HCT 41.0 09/19/2014   MCV 92.3 09/19/2014   PLT 216 09/19/2014      Component Value Date/Time   NA 137 09/19/2014 1908   K 3.3 (L) 09/19/2014 1908   CL 99 09/19/2014 1908   CO2 20 09/19/2014 1856   GLUCOSE 131 (H) 09/19/2014 1908   BUN 18 09/19/2014 1908   CREATININE 1.00 09/19/2014 1908   CALCIUM 9.1 09/19/2014 1856   PROT 7.0 09/19/2014 1856   ALBUMIN 3.9 09/19/2014 1856   AST 24 09/19/2014 1856   ALT 13 09/19/2014 1856   ALKPHOS 58 09/19/2014 1856  BILITOT 1.1 09/19/2014 1856   GFRNONAA 65 (L) 09/19/2014 1856   GFRAA 76 (L) 09/19/2014 1856   Lab Results  Component Value Date   CHOL 139  09/20/2014   HDL 28 (L) 09/20/2014   LDLCALC 68 09/20/2014   TRIG 214 (H) 09/20/2014   CHOLHDL 5.0 09/20/2014   Lab Results  Component Value Date   HGBA1C 6.6 (H) 09/20/2014   No results found for: VITAMINB12 No results found for: TSH    ASSESSMENT AND PLAN 72 y.o. year old male  has a past medical history of Diabetes mellitus without complication ([place]), Hyperlipemia, Hypertension, large right middle cerebral artery infarct, embolic (06/10/3788), and Snoring (07/03/2015). here with:  1. 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 Seizures stable   I had a long d/w patient about his recent stroke, risk for recurrent stroke/TIAs, personally independently reviewed imaging studies and stroke evaluation results and answered questions.Continue Eliquis (apixaban) daily  for secondary stroke prevention and maintain strict control of hypertension with blood pressure goal below 130/90, diabetes with hemoglobin A1c goal below 6.5% and lipids with LDL cholesterol goal below 70 mg/dL. I also advised the patient to eat a healthy diet with plenty of whole grains, cereals, fruits and vegetables, exercise regularly and maintain ideal body weight Followup in the future with me as needed.  -continue keppra for seizure prevention - as patient has been stable on keppra and seizure free, consider PCP prescribing for patient.  -continue to control diabetes with diet and medication -follow up only as needed for new or worsening neurologic conditions Greater than 50% time during this 25 minute visit was spent on counseling and coordination of care about his spastic hemiplegia and seizures and answering questions  Antony Contras, MD 07/13/2017, 4:36 PM [facility] [address], [place], [place] [address] [phone]

## 2017-07-16 DIAGNOSIS — I639 Cerebral infarction, unspecified: Secondary | ICD-10-CM | POA: Diagnosis not present

## 2017-07-16 DIAGNOSIS — M25612 Stiffness of left shoulder, not elsewhere classified: Secondary | ICD-10-CM | POA: Diagnosis not present

## 2017-07-16 DIAGNOSIS — M25512 Pain in left shoulder: Secondary | ICD-10-CM | POA: Diagnosis not present

## 2017-09-17 ENCOUNTER — Telehealth: Payer: Self-pay | Admitting: Internal Medicine

## 2017-09-17 MED ORDER — APIXABAN 5 MG PO TABS: 5.0000 mg | ORAL_TABLET | Freq: Two times a day (BID) | ORAL | 1 refills | Status: DC

## 2017-09-17 NOTE — Telephone Encounter (Signed)
New message       *STAT* If patient is at the pharmacy, call can be transferred to refill team.   1. Which medications need to be refilled? (please list name of each medication and dose if known)  eliquis 5mg  2. Which pharmacy/location (including street and city if local pharmacy) is medication to be sent to? [facility] on [address].  Please remove [facility] off of pt record 3. Do they need a 30 day or 90 day supply? 30 day

## 2017-09-17 NOTE — Telephone Encounter (Signed)
Pt is a 72 yr male. Last weight 104Kg on 07/13/17. SCr 0.94 on 06/08/17 from [place]. Pt last saw Dr [name] on 09/15/2016 and per that visit note he is to follow up with him PRN.  Rx sent for refill on Eliquis.

## 2017-10-09 DIAGNOSIS — L98499 Non-pressure chronic ulcer of skin of other sites with unspecified severity: Secondary | ICD-10-CM | POA: Diagnosis not present

## 2017-10-12 DIAGNOSIS — I10 Essential (primary) hypertension: Secondary | ICD-10-CM | POA: Diagnosis not present

## 2017-10-12 DIAGNOSIS — L98499 Non-pressure chronic ulcer of skin of other sites with unspecified severity: Secondary | ICD-10-CM | POA: Diagnosis not present

## 2017-10-15 DIAGNOSIS — S81802A Unspecified open wound, left lower leg, initial encounter: Secondary | ICD-10-CM | POA: Diagnosis not present

## 2017-10-15 DIAGNOSIS — R600 Localized edema: Secondary | ICD-10-CM | POA: Diagnosis not present

## 2017-10-15 DIAGNOSIS — E119 Type 2 diabetes mellitus without complications: Secondary | ICD-10-CM | POA: Diagnosis not present

## 2017-10-15 DIAGNOSIS — I10 Essential (primary) hypertension: Secondary | ICD-10-CM | POA: Diagnosis not present

## 2017-10-19 DIAGNOSIS — E119 Type 2 diabetes mellitus without complications: Secondary | ICD-10-CM | POA: Diagnosis not present

## 2017-10-19 DIAGNOSIS — S81802A Unspecified open wound, left lower leg, initial encounter: Secondary | ICD-10-CM | POA: Diagnosis not present

## 2017-10-19 DIAGNOSIS — R600 Localized edema: Secondary | ICD-10-CM | POA: Diagnosis not present

## 2017-10-19 DIAGNOSIS — I10 Essential (primary) hypertension: Secondary | ICD-10-CM | POA: Diagnosis not present

## 2017-10-30 DIAGNOSIS — E119 Type 2 diabetes mellitus without complications: Secondary | ICD-10-CM | POA: Diagnosis not present

## 2017-10-30 DIAGNOSIS — I10 Essential (primary) hypertension: Secondary | ICD-10-CM | POA: Diagnosis not present

## 2017-10-30 DIAGNOSIS — R600 Localized edema: Secondary | ICD-10-CM | POA: Diagnosis not present

## 2017-11-12 DIAGNOSIS — Z0000 Encounter for general adult medical examination without abnormal findings: Secondary | ICD-10-CM | POA: Diagnosis not present

## 2017-11-12 DIAGNOSIS — N529 Male erectile dysfunction, unspecified: Secondary | ICD-10-CM | POA: Diagnosis not present

## 2017-11-12 DIAGNOSIS — I10 Essential (primary) hypertension: Secondary | ICD-10-CM | POA: Diagnosis not present

## 2017-11-12 DIAGNOSIS — Z125 Encounter for screening for malignant neoplasm of prostate: Secondary | ICD-10-CM | POA: Diagnosis not present

## 2017-11-12 DIAGNOSIS — E119 Type 2 diabetes mellitus without complications: Secondary | ICD-10-CM | POA: Diagnosis not present

## 2017-11-12 DIAGNOSIS — I639 Cerebral infarction, unspecified: Secondary | ICD-10-CM | POA: Diagnosis not present

## 2017-11-12 DIAGNOSIS — I4891 Unspecified atrial fibrillation: Secondary | ICD-10-CM | POA: Diagnosis not present

## 2017-11-19 DIAGNOSIS — E119 Type 2 diabetes mellitus without complications: Secondary | ICD-10-CM | POA: Diagnosis not present

## 2017-11-19 DIAGNOSIS — Z0000 Encounter for general adult medical examination without abnormal findings: Secondary | ICD-10-CM | POA: Diagnosis not present

## 2018-02-12 DIAGNOSIS — I10 Essential (primary) hypertension: Secondary | ICD-10-CM | POA: Diagnosis not present

## 2018-02-12 DIAGNOSIS — G40909 Epilepsy, unspecified, not intractable, without status epilepticus: Secondary | ICD-10-CM | POA: Diagnosis not present

## 2018-02-12 DIAGNOSIS — E119 Type 2 diabetes mellitus without complications: Secondary | ICD-10-CM | POA: Diagnosis not present

## 2018-02-12 DIAGNOSIS — Z23 Encounter for immunization: Secondary | ICD-10-CM | POA: Diagnosis not present

## 2018-02-12 DIAGNOSIS — Z7984 Long term (current) use of oral hypoglycemic drugs: Secondary | ICD-10-CM | POA: Diagnosis not present

## 2018-02-12 DIAGNOSIS — E876 Hypokalemia: Secondary | ICD-10-CM | POA: Diagnosis not present

## 2018-02-12 DIAGNOSIS — I4891 Unspecified atrial fibrillation: Secondary | ICD-10-CM | POA: Diagnosis not present

## 2018-02-12 DIAGNOSIS — R159 Full incontinence of feces: Secondary | ICD-10-CM | POA: Diagnosis not present

## 2018-03-16 ENCOUNTER — Other Ambulatory Visit: Payer: Self-pay | Admitting: Gastroenterology

## 2018-03-16 ENCOUNTER — Ambulatory Visit
Admission: RE | Admit: 2018-03-16 | Discharge: 2018-03-16 | Disposition: A | Discharge status: Home / Self Care | Payer: Medicare Other | Source: Ambulatory Visit | Attending: Gastroenterology | Admitting: Gastroenterology

## 2018-03-16 DIAGNOSIS — R151 Fecal smearing: Secondary | ICD-10-CM

## 2018-03-16 DIAGNOSIS — K648 Other hemorrhoids: Secondary | ICD-10-CM | POA: Diagnosis not present

## 2018-03-16 DIAGNOSIS — Z8673 Personal history of transient ischemic attack (TIA), and cerebral infarction without residual deficits: Secondary | ICD-10-CM | POA: Diagnosis not present

## 2018-03-16 DIAGNOSIS — R109 Unspecified abdominal pain: Secondary | ICD-10-CM | POA: Diagnosis not present

## 2018-03-16 DIAGNOSIS — R140 Abdominal distension (gaseous): Secondary | ICD-10-CM | POA: Diagnosis not present

## 2018-03-16 DIAGNOSIS — I4891 Unspecified atrial fibrillation: Secondary | ICD-10-CM | POA: Diagnosis not present

## 2018-04-06 ENCOUNTER — Other Ambulatory Visit: Payer: Self-pay | Admitting: Internal Medicine

## 2018-04-06 NOTE — Telephone Encounter (Signed)
Eliquis 5mg  refill request received; pt is 72 yrs old, Wt-104kg, Crea-0.98 via [facility] on 02/12/18, last seen by Dr. [name] on 09/15/16, and per Allred's note on 09/15/16 pt to return as needed; pt is on correct doseage and will send refill to requested pharmacy.

## 2018-04-12 DIAGNOSIS — R351 Nocturia: Secondary | ICD-10-CM | POA: Diagnosis not present

## 2018-04-12 DIAGNOSIS — M6289 Other specified disorders of muscle: Secondary | ICD-10-CM | POA: Diagnosis not present

## 2018-04-12 DIAGNOSIS — M6281 Muscle weakness (generalized): Secondary | ICD-10-CM | POA: Diagnosis not present

## 2018-04-12 DIAGNOSIS — I639 Cerebral infarction, unspecified: Secondary | ICD-10-CM | POA: Diagnosis not present

## 2018-04-12 DIAGNOSIS — R151 Fecal smearing: Secondary | ICD-10-CM | POA: Diagnosis not present

## 2018-04-26 DIAGNOSIS — K5900 Constipation, unspecified: Secondary | ICD-10-CM | POA: Diagnosis not present

## 2018-04-26 DIAGNOSIS — R351 Nocturia: Secondary | ICD-10-CM | POA: Diagnosis not present

## 2018-04-26 DIAGNOSIS — R151 Fecal smearing: Secondary | ICD-10-CM | POA: Diagnosis not present

## 2018-04-26 DIAGNOSIS — M6281 Muscle weakness (generalized): Secondary | ICD-10-CM | POA: Diagnosis not present

## 2018-04-26 DIAGNOSIS — M6289 Other specified disorders of muscle: Secondary | ICD-10-CM | POA: Diagnosis not present

## 2018-05-14 DIAGNOSIS — E876 Hypokalemia: Secondary | ICD-10-CM | POA: Diagnosis not present

## 2018-05-14 DIAGNOSIS — I4891 Unspecified atrial fibrillation: Secondary | ICD-10-CM | POA: Diagnosis not present

## 2018-05-14 DIAGNOSIS — G40909 Epilepsy, unspecified, not intractable, without status epilepticus: Secondary | ICD-10-CM | POA: Diagnosis not present

## 2018-05-14 DIAGNOSIS — Z1211 Encounter for screening for malignant neoplasm of colon: Secondary | ICD-10-CM | POA: Diagnosis not present

## 2018-05-14 DIAGNOSIS — E1169 Type 2 diabetes mellitus with other specified complication: Secondary | ICD-10-CM | POA: Diagnosis not present

## 2018-05-14 DIAGNOSIS — Z7984 Long term (current) use of oral hypoglycemic drugs: Secondary | ICD-10-CM | POA: Diagnosis not present

## 2018-05-14 DIAGNOSIS — R159 Full incontinence of feces: Secondary | ICD-10-CM | POA: Diagnosis not present

## 2018-05-14 DIAGNOSIS — I10 Essential (primary) hypertension: Secondary | ICD-10-CM | POA: Diagnosis not present

## 2018-05-17 DIAGNOSIS — E119 Type 2 diabetes mellitus without complications: Secondary | ICD-10-CM | POA: Diagnosis not present

## 2018-05-17 DIAGNOSIS — H04129 Dry eye syndrome of unspecified lacrimal gland: Secondary | ICD-10-CM | POA: Diagnosis not present

## 2018-05-17 DIAGNOSIS — H52223 Regular astigmatism, bilateral: Secondary | ICD-10-CM | POA: Diagnosis not present

## 2018-05-17 DIAGNOSIS — H538 Other visual disturbances: Secondary | ICD-10-CM | POA: Diagnosis not present

## 2018-05-17 DIAGNOSIS — I6340 Cerebral infarction due to embolism of unspecified cerebral artery: Secondary | ICD-10-CM | POA: Diagnosis not present

## 2018-05-20 DIAGNOSIS — I4891 Unspecified atrial fibrillation: Secondary | ICD-10-CM | POA: Diagnosis not present

## 2018-05-20 DIAGNOSIS — R151 Fecal smearing: Secondary | ICD-10-CM | POA: Diagnosis not present

## 2018-05-20 DIAGNOSIS — Z8673 Personal history of transient ischemic attack (TIA), and cerebral infarction without residual deficits: Secondary | ICD-10-CM | POA: Diagnosis not present

## 2018-05-24 ENCOUNTER — Telehealth: Payer: Self-pay | Admitting: Neurology

## 2018-05-24 ENCOUNTER — Ambulatory Visit (INDEPENDENT_AMBULATORY_CARE_PROVIDER_SITE_OTHER): Payer: Medicare Other | Admitting: Internal Medicine

## 2018-05-24 ENCOUNTER — Encounter: Payer: Self-pay | Admitting: Internal Medicine

## 2018-05-24 ENCOUNTER — Other Ambulatory Visit: Payer: Self-pay

## 2018-05-24 VITALS — BP 132/84 | HR 89 | Ht 75.0 in | Wt 230.6 lb

## 2018-05-24 DIAGNOSIS — I10 Essential (primary) hypertension: Secondary | ICD-10-CM

## 2018-05-24 DIAGNOSIS — I4821 Permanent atrial fibrillation: Secondary | ICD-10-CM | POA: Diagnosis not present

## 2018-05-24 DIAGNOSIS — I6389 Other cerebral infarction: Secondary | ICD-10-CM

## 2018-05-24 DIAGNOSIS — Z01810 Encounter for preprocedural cardiovascular examination: Secondary | ICD-10-CM

## 2018-05-24 MED ORDER — LEVETIRACETAM 500 MG PO TABS: 500.0000 mg | ORAL_TABLET | Freq: Two times a day (BID) | ORAL | 2 refills | Status: DC

## 2018-05-24 MED ORDER — LEVETIRACETAM 500 MG PO TABS: 500.0000 mg | ORAL_TABLET | Freq: Two times a day (BID) | ORAL | 11 refills | Status: DC

## 2018-05-24 NOTE — Addendum Note (Signed)
Addended by: Marcial Pacas on: 05/24/2018 03:08 PM   Modules accepted: Orders

## 2018-05-24 NOTE — Telephone Encounter (Signed)
Revised. 

## 2018-05-24 NOTE — Telephone Encounter (Signed)
Rn call [facility] on [address]. Rn spoke with Pam pharmacist about call from pts wife of medication being out of stock. Pam stated they will not have it till the end of January 2020. She also has call [facility] to find if they have any keppra XR in stock. Per [facility] dont have [facility]. RN ask pharmacy what do they recommend until the keppra Xr is available. Pam stated pt will have to be on a non long acting keppra medication until the keppra XR is available. Rn stated message will be sent to work in pm doctor. Message sent to Dr. Krista Blue.

## 2018-05-24 NOTE — Telephone Encounter (Signed)
Rn spoke with [facility] pharmacist. She stated they do have plain keppra on file for pt. They are out of keppra XR til the end of January 2020.

## 2018-05-24 NOTE — Telephone Encounter (Signed)
Please call patient I have called in regular preparation of Keppra 500 mg twice daily 60 tablets with 11 refills to his pharmacy

## 2018-05-24 NOTE — Patient Instructions (Addendum)
Medication Instructions:  Your physician recommends that you continue on your current medications as directed. Please refer to the Current Medication list given to you today.  Labwork: None ordered.  Testing/Procedures: Your physician has requested that you have a lexiscan myoview. For further information please visit [email or web address]. Please follow instruction sheet, as given.  Please schedule for lexiscan myoview.  Follow-Up: Your physician wants you to follow-up in: one year with Ricky with [facility].   You will receive a reminder letter in the mail two months in advance. If you don't receive a letter, please call our office to schedule the follow-up appointment.  Any Other Special Instructions Will Be Listed Below (If Applicable).  If you need a refill on your cardiac medications before your next appointment, please call your pharmacy.

## 2018-05-24 NOTE — Telephone Encounter (Signed)
Patient's wife [name] (onDPR) states [facility] [address] states levETIRAcetam (KEPPRA XR) 500 MG 24 hr tablet will not be available until the end of January. Patient is almost out of medication. What should he do?    [name] can be reached at [phone] or [phone].

## 2018-05-24 NOTE — Progress Notes (Signed)
Electrophysiology Office Note Date: 05/24/2018  ID:  Donald [name], DOB [birth year], MRN [id number]  PCP: London Pepper, MD  Electrophysiologist: Dr [name]  CC: Follow up for atrial fibrillation  Donald [name] is a 72 y.o. male seen today for routine electrophysiology followup.  Since last being seen in our clinic, the patient reports doing very well. He continues to work on lifestyle changes including physical activity, weight loss, and blood glucose control. At this point is is fairly unaware of his arrhythmia. Denies any heart racing. He is currently undergoing evaluation for erectile dysfunction.   He denies chest pain, palpitations, dyspnea, PND, orthopnea, nausea, vomiting, dizziness, syncope, weight gain, or early satiety. +mild edema  Past Medical History:  Diagnosis Date  . Diabetes mellitus without complication ([place])   . Hyperlipemia   . Hypertension   . large right middle cerebral artery infarct, embolic 1/0/6269   a. s/p IV tPA, b. source unknown, c. loop recorder placed  . Snoring 07/03/2015   Past Surgical History:  Procedure Laterality Date  . LOOP RECORDER IMPLANT  06/07/2013   MDT LinQ implanted by Dr [name] for cryptogenic stroke  . LOOP RECORDER IMPLANT N/A 06/07/2013   Procedure: LOOP RECORDER IMPLANT;  Surgeon: [name], MD;  Location: [facility] CATH LAB;  Service: Cardiovascular;  Laterality: N/A;  . TEE WITHOUT CARDIOVERSION N/A 06/07/2013   Procedure: TRANSESOPHAGEAL ECHOCARDIOGRAM (TEE);  Surgeon: [name], MD;  Location: [facility] ENDOSCOPY;  Service: Cardiovascular;  Laterality: N/A;    Current Outpatient Medications  Medication Sig Dispense Refill  . amLODipine (NORVASC) 10 MG tablet Take 1 tablet (10 mg total) by mouth daily. 30 tablet 1  . Cholecalciferol (VITAMIN D-3) 1000 units CAPS Take by mouth daily.    . cloNIDine (CATAPRES) 0.3 MG tablet Take 0.3 mg by mouth 2 (two) times daily.    Marland Kitchen co-enzyme Q-10 30 MG capsule Take 30 mg by mouth  3 (three) times daily.    Marland Kitchen ELIQUIS 5 MG TABS tablet TAKE ONE TABLET BY MOUTH TWICE A DAY 60 tablet 5  . hydrALAZINE (APRESOLINE) 10 MG tablet Take 10 mg by mouth daily.     Marland Kitchen levETIRAcetam (KEPPRA XR) 500 MG 24 hr tablet Take 1,000 mg by mouth at bedtime.    Marland Kitchen lisinopril (PRINIVIL,ZESTRIL) 40 MG tablet Take 1 tablet (40 mg total) by mouth daily. 30 tablet 1  . metFORMIN (GLUCOPHAGE-XR) 750 MG 24 hr tablet Take 500 mg by mouth 2 (two) times daily.     . Multiple Vitamin (MULTIVITAMIN) capsule Take 1 capsule by mouth daily.    . potassium chloride SA (K-DUR,KLOR-CON) 20 MEQ tablet Take 20 mEq by mouth 4 (four) times daily.     . vitamin E 400 UNIT capsule Take 400 Units by mouth daily.     No current facility-administered medications for this visit.     Allergies:   Bitolterol and Bidil [isosorb dinitrate-hydralazine]   Social History: Social History   Socioeconomic History  . Marital status: Married    Spouse name: [name]  . Number of children: 2  . Years of education: College  . Highest education level: Not on file  Occupational History  . Occupation: Retired    Fish farm manager: Psychologist, educational  Social Needs  . Financial resource strain: Not on file  . Food insecurity:    Worry: Not on file    Inability: Not on file  . Transportation needs:    Medical: Not on file    Non-medical: Not  on file  Tobacco Use  . Smoking status: Former Research scientist (life sciences)  . Smokeless tobacco: Never Used  . Tobacco comment: Quit 25 years ago.  Substance and Sexual Activity  . Alcohol use: Yes    Alcohol/week: 1.0 standard drinks    Types: 1 Glasses of wine per week    Comment: Rare  . Drug use: No  . Sexual activity: Never  Lifestyle  . Physical activity:    Days per week: Not on file    Minutes per session: Not on file  . Stress: Not on file  Relationships  . Social connections:    Talks on phone: Not on file    Gets together: Not on file    Attends religious service: Not on file    Active member  of club or organization: Not on file    Attends meetings of clubs or organizations: Not on file    Relationship status: Not on file  . Intimate partner violence:    Fear of current or ex partner: Not on file    Emotionally abused: Not on file    Physically abused: Not on file    Forced sexual activity: Not on file  Other Topics Concern  . Not on file  Social History Narrative   Patient lives at home [name]).   Retired works part time 10 hours a week.   Right handed   Education college   Caffeine: one cup daily coffee   No soda or tea.    Family History: Family History  Problem Relation Age of Onset  . Dementia Mother   . Diabetes Mother   . Heart attack Father     Review of Systems: All other systems reviewed and are otherwise negative except as noted above.   Physical Exam: VS:  BP 132/84   Pulse 89   Ht 6\' 3"  (1.905 m)   Wt 230 lb 9.6 oz (104.6 kg)   SpO2 98%   BMI 28.82 kg/m  , BMI Body mass index is 28.82 kg/m. Wt Readings from Last 3 Encounters:  05/24/18 230 lb 9.6 oz (104.6 kg)  07/13/17 229 lb 3.2 oz (104 kg)  09/15/16 221 lb (100.2 kg)    GEN- The patient is well appearing, alert and oriented x 3 today.   HEENT: normocephalic, atraumatic; sclera clear, conjunctiva pink; hearing intact; oropharynx clear; neck supple, no JVP Lymph- no cervical lymphadenopathy Lungs- Clear to ausculation bilaterally, normal work of breathing.  No wheezes, rales, rhonchi Heart- irregularly irregular rate and rhythm, no murmurs, rubs or gallops, PMI not laterally displaced Extremities- no clubbing, cyanosis, trace edema, DP/PT/radial pulses 2+ bilaterally MS- no significant deformity or atrophy Skin- warm and dry, no rash or lesion  Psych- euthymic mood, full affect Neuro- strength and sensation are intact   EKG:  EKG is ordered today. The ekg ordered today shows atrial fibrillation HR 89, LAFB, slow R wave prog, QRS 94, QTc 445  Recent Labs: No results found for  requested labs within last 8760 hours.    Other studies Reviewed: Additional studies/ records that were reviewed today include:  Echo 08/03/15 - Left ventricle: The cavity size was normal. Wall thickness was   increased in a pattern of mild LVH. Systolic function was   vigorous. The estimated ejection fraction was in the range of 65%   to 70%. Wall motion was normal; there were no regional wall   motion abnormalities. The study is not technically sufficient to   allow evaluation of LV  diastolic function. - Aortic valve: Mildly calcified annulus. Trileaflet. - Mitral valve: There was trivial regurgitation. - Left atrium: The atrium was mildly dilated. - Right atrium: The atrium was at the upper limits of normal in   size. - Atrial septum: No defect or patent foramen ovale was identified. - Tricuspid valve: There was trivial regurgitation. - Pulmonary arteries: Systolic pressure could not be accurately   estimated. - Pericardium, extracardiac: A trivial pericardial effusion was   identified circumferential to the heart.   Assessment and Plan:  1. Permanent atrial fibrillation Patient continues in asymptomatic atrial fibrillation Continue Eliquis 5 mg BID for CHADS2VASC score of at least 5. ILR past EOL. Patient elected not to have it removed at this time.  Again declines today.  2. HTN Stable, no changes today  3. Prior CVA Continue anticoagulation as above  4. Erectile dysfunction Patient currently undergoing evaluation with urology. Considering surgery.  Will arrange lexiscan myoview for risk stratification. Clearance for low risk procedure pending results. Will need to hold anticoagulation prior to procedure.   Current medicines are reviewed at length with the patient today.   The patient does not have concerns regarding his medicines.  The following changes were made today:  none  Labs/ tests ordered today include:  Orders Placed This Encounter  Procedures  .  Myocardial Perfusion Imaging  . EKG 12-Lead     Disposition:   Follow up with Afib clinic in 1 year   Army Fossa MD 05/24/2018 4:57 PM   [facility] [address] [place] [address] [phone] (office) [phone] (fax)

## 2018-05-24 NOTE — Telephone Encounter (Addendum)
Rn call patients wife about pharmacy not having keppra Xr in stock till January 2020. Rn stated per Dr.Yan it was change to regular keppra 500mg  in am, and 500mg  in the pm. RN ask the wife how many days of medications does he have.The wife stated he has about 24 pills left which is 12 days left. The wife stated the plain keppra bother him in the past but her husband will have to take it until the XR is in stock. Rn stated she can call other pharmacies to see if they have keppra Xr in stock before he runs out. RN stated the plain keppra will be on file at [facility] if she finds out another pharmacy has it in stock. The wife will call us on Monday.The wife verbalized understanding.

## 2018-06-10 DIAGNOSIS — M6289 Other specified disorders of muscle: Secondary | ICD-10-CM | POA: Diagnosis not present

## 2018-06-10 DIAGNOSIS — M6281 Muscle weakness (generalized): Secondary | ICD-10-CM | POA: Diagnosis not present

## 2018-06-10 DIAGNOSIS — R151 Fecal smearing: Secondary | ICD-10-CM | POA: Diagnosis not present

## 2018-06-10 DIAGNOSIS — R351 Nocturia: Secondary | ICD-10-CM | POA: Diagnosis not present

## 2018-06-14 ENCOUNTER — Telehealth (HOSPITAL_COMMUNITY): Payer: Self-pay | Admitting: *Deleted

## 2018-06-14 NOTE — Telephone Encounter (Signed)
Patient given detailed instructions per Myocardial Perfusion Study Information Sheet for the test on 06/16/18. Patient notified to arrive 15 minutes early and that it is imperative to arrive on time for appointment to keep from having the test rescheduled.  If you need to cancel or reschedule your appointment, please call the office within 24 hours of your appointment. . Patient verbalized understanding. Kirstie Peri

## 2018-06-16 ENCOUNTER — Encounter (HOSPITAL_COMMUNITY): Payer: Medicare Other

## 2018-06-16 ENCOUNTER — Ambulatory Visit (HOSPITAL_COMMUNITY): Payer: Medicare Other | Attending: Internal Medicine

## 2018-06-16 DIAGNOSIS — I4821 Permanent atrial fibrillation: Secondary | ICD-10-CM | POA: Diagnosis not present

## 2018-06-16 DIAGNOSIS — Z01810 Encounter for preprocedural cardiovascular examination: Secondary | ICD-10-CM

## 2018-06-16 LAB — MYOCARDIAL PERFUSION IMAGING
LV dias vol: 92 mL (ref 62–150)
LV sys vol: 44 mL
Peak HR: 80 {beats}/min
Rest HR: 58 {beats}/min
SDS: 1
SRS: 0
SSS: 1
TID: 0.99

## 2018-06-16 MED ORDER — TECHNETIUM TC 99M TETROFOSMIN IV KIT
10.8000 | PACK | Freq: Once | INTRAVENOUS | Status: AC | PRN
  Administered 2018-06-16: 10.8 via INTRAVENOUS
  Filled 2018-06-16: qty 11

## 2018-06-16 MED ORDER — ADENOSINE (DIAGNOSTIC) 3 MG/ML IV SOLN
0.5600 mg/kg | Freq: Once | INTRAVENOUS | Status: AC
  Administered 2018-06-16: 58.5 mg via INTRAVENOUS

## 2018-06-16 MED ORDER — TECHNETIUM TC 99M TETROFOSMIN IV KIT
32.1000 | PACK | Freq: Once | INTRAVENOUS | Status: AC | PRN
  Administered 2018-06-16: 32.1 via INTRAVENOUS
  Filled 2018-06-16: qty 33

## 2018-06-18 DIAGNOSIS — H1032 Unspecified acute conjunctivitis, left eye: Secondary | ICD-10-CM | POA: Diagnosis not present

## 2018-07-15 ENCOUNTER — Encounter: Payer: Self-pay | Admitting: Neurology

## 2018-07-15 ENCOUNTER — Ambulatory Visit (INDEPENDENT_AMBULATORY_CARE_PROVIDER_SITE_OTHER): Payer: Medicare Other | Admitting: Neurology

## 2018-07-15 VITALS — BP 144/82 | HR 69 | Ht 75.0 in | Wt 230.0 lb

## 2018-07-15 DIAGNOSIS — I6529 Occlusion and stenosis of unspecified carotid artery: Secondary | ICD-10-CM | POA: Diagnosis not present

## 2018-07-15 DIAGNOSIS — F419 Anxiety disorder, unspecified: Secondary | ICD-10-CM

## 2018-07-15 MED ORDER — LEVETIRACETAM ER 500 MG PO TB24: 1000.0000 mg | ORAL_TABLET | Freq: Every day | ORAL | 3 refills | Status: DC

## 2018-07-15 NOTE — Progress Notes (Signed)
PATIENT: Donald [name] DOB: Jul 26, 1945  REASON FOR VISIT: follow up- stroke, seizures HISTORY FROM: patient  HISTORY OF PRESENT ILLNESS: Donald [name] is a 73 year old male with a history of stroke and seizures. He returns today for follow-up. He continues on Eliquis and is tolerating it well. His blood pressure is slightly elevated today. His primary care is managing his hypertension, hyperlipidemia and diabetes. He reports overall he is doing well. He uses a cane when ambulating. Went to PT had good benefit. Using AFO on brace on the left.Denies any falls. He continues on Keppra XR1000 mg at bedtime. He denies any seizure events. He operates a Teacher, music without difficulty. Patient is asking about driving. He returns today for an evaluation.  HISTORY 07/09/15:Donald [name]  Is a 73 year old male with a history of stroke and seizures. He returns today for follow-up. The patient was switched to Eliquis by his cardiologist after he was found to have atrial fibrillation in October. He is tolerating this medication well. The patient's blood pressure is elevated slightly today however this is consistent with every time he comes in to the doctor's office. He states that he has whitecoat syndrome. His primary care is managing his cholesterol  And diabetes. He denies any additional strokelike symptoms. He does not have any weakness from the stroke but does feel like his reaction time on the left side is slower. He uses a cane when  Ambulating.  The patient states that he has a hard time with  ambulatingup or down steps. He is wondering if physical therapy would be beneficial. He continues to use Keppra XR thousand milligrams at bedtime. He is not had any additional seizure events. He denies any new neurological symptoms. He returns today for an evaluation.  HISTORY 01/08/15: Donald [name] is a 73 year old male with a history of ischemic stroke and seizures. He returns today for follow-up. The  patient continues to take Plavix for stroke prevention. The patient's primary care manages his blood pressure, cholesterol and diabetes. Patient states that his blood pressure is slightly elevated today. However he relates this to "white coat syndrome." Patient states that he had a seizure in April at that time he was placed on Keppra XR 1000 mg at bedtime. He states that since then he has not had any seizures. He does not operate a motor vehicle. He is able to complete all ADLs independently. Patient states that after the stroke he has had some numbness in the bottom of the left foot and that affects his balance. He continues to use a cane when ambulating. Denies any recent falls. Denies any new neurological symptoms. He returns today for an evaluation.  HISTORY 07/05/14:Donald [name] is a 73 year old male with a history of stroke on 06/03/13. He returns today for follow-up. He is currently taking Plavix for stroke prevention. His HTN, cholesterol and Diabetes is managed by his PCP. He states that his BP has been controlled. When he checks it at home it is usually 130s/70s. His states that his last hemoglobin A1C was 5.9 % . He continues to take metformin- his dosage has recently been decreased. His cholesterol has been in normal range according to the patient and therefore medication has not been required. Patient will use a cane when he is out in public. He does not use it at home. He feels that his walking has improved. Denies any falls. He notes that his fine motor skills in the left hand has improved. He states that he is  now able to cook at home. He does have some sensory changes in the left hand and foot. Denies any new symptoms.   HISTORY 12/20/13 ([name]): is an 73 y.o. male who was visiting here for the holidays on 06/03/2013. When he got up he was normal. As he was returning to bed began to be short of breath. Patient then was noted to have slurred speech by his wife and she noted a facial droop.  Patient seemed to be weak on the left a well. EMS was called and the patient was brought in as a code stroke. Initial NIHSS of 8. Patient was given TPA. No intervenable lesion was seen on CTA. He was admitted to the neuro ICU for further evaluation and treatment. CT angio head showed Subtle attenuation of the distal right superior MCA territory branch vessels as compared to the left. No proximal branch occlusion or high-grade flow-limiting stenosis identified within the right middle cerebral artery proximally. Otherwise unremarkable CTA of the head without evidence of occlusion, high-grade stenosis, or aneurysm elsewhere within the brain.  CT angio neck was a normal CTA of the neck without evidence of high-grade stenosis, occlusion, or dissection. 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 MRI of the brain showed an acute large right middle cerebral artery territory infarct. Minimal underlying petechial hemorrhage without lobar hematoma. 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 Minimal additional white matter changes suggest chronic small vessel ischemic disease. Acute on chronic paranasal sinusitis. 2D Echocardiogram with the estimated ejection fraction was in the range of 60% to 65%. He was admitted to [facility] 06/07/2013 through 07/02/2013. He is completed home physical therapy and occupational therapy and is about to start going to outpatient neuro rehabilitation. He is making good progress with his rehabilitation and is very determined. He and his wife have moved in with her daughter here in [place]. He has established care with Dr. Orland Mustard and is seeing him every 3 weeks for adjustments to his hypertensive medications, as his hypertension has been very difficult to control since his 41s. He thinks he forgot to take his blood pressure medicine the day he had the stroke. His blood pressure is elevated office today at 161/90, he is  tolerating Plavix well without any significant bruising or bleeding. He is able to ambulate short distances with a hemiwalker and AFO brace on the left. He is having some spasticity in the left arm with clonus at times.    Update 07/14/2016 : He returns for follow-up after last visit 6 months ago. He is a complaint by his wife. He states is doing well without recurrent stroke or TIA symptoms. He remains on eliquis which is tolerating well without bleeding or bruising. He states his blood pressure is usually well controlled at home in the 7:84 systolic range but does tend to go up when he visits physicians. Today it is 151/90 in office. He states his diabetes is well controlled and last him about an A1c was 5.9. He does not check his fasting sugars. He is tolerating Keppra XR 1000 mg daily well without side effects and states he is not had any seizures for more than 2 years. He still has some difficulty walking but wears an AFO and walks with stiffness of his left leg. He is a height but no falls or injuries. He has not had any follow-up carotid ultrasound done for more than 2 years. Updated 07/15/2018 : He returns for follow-up after last visit 2  years ago.  Continues to do well and has not had recurrent stroke or TIA symptoms since 2015.  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  He walks with a cane with stiffness of his left leg.  He has had no falls or injuries.  He has had no recurrent stroke or TIA symptoms.  He remains on Eliquis which is tolerating well with only minor bruising.  He states his fasting sugars have all been good and last hemoglobin A1c was 6.8.  His blood pressure is well controlled though it is slightly elevated in office today at 144/82.  He has not had any seizures since 2016 and remains on Keppra XR 1 gm at night and is tolerating it well without any side effects.  He is considering having penile implant surgery and has already met with cardiologist Dr.  [name] and got cardiac clearance.  He underwent medical stress test which went fine.  He has no new complaints today. REVIEW OF SYSTEMS: Out of a complete 14 system review of symptoms, the patient complains only of the following symptoms, and all other reviewed systems are negative. Left foot drop, gait difficulty, only and all other systems negative ALLERGIES: Allergies  Allergen Reactions  . Bitolterol   . Bidil [Isosorb Dinitrate-Hydralazine] Other (See Comments)    Lethargic,tired     HOME MEDICATIONS: Outpatient Medications Prior to Visit  Medication Sig Dispense Refill  . amLODipine (NORVASC) 10 MG tablet Take 1 tablet (10 mg total) by mouth daily. 30 tablet 1  . Cholecalciferol (VITAMIN D-3) 1000 units CAPS Take by mouth daily.    . cloNIDine (CATAPRES) 0.3 MG tablet Take 0.3 mg by mouth 2 (two) times daily.    Marland Kitchen co-enzyme Q-10 30 MG capsule Take 30 mg by mouth 3 (three) times daily.    Marland Kitchen ELIQUIS 5 MG TABS tablet TAKE ONE TABLET BY MOUTH TWICE A DAY 60 tablet 5  . hydrALAZINE (APRESOLINE) 10 MG tablet Take 10 mg by mouth daily.     Marland Kitchen lisinopril (PRINIVIL,ZESTRIL) 40 MG tablet Take 1 tablet (40 mg total) by mouth daily. 30 tablet 1  . metFORMIN (GLUCOPHAGE-XR) 500 MG 24 hr tablet 500 mg 2 (two) times daily.    . Multiple Vitamin (MULTIVITAMIN) capsule Take 1 capsule by mouth daily.    . potassium chloride SA (K-DUR,KLOR-CON) 20 MEQ tablet Take 20 mEq by mouth 4 (four) times daily.     . vitamin E 400 UNIT capsule Take 400 Units by mouth daily.    Marland Kitchen levETIRAcetam (KEPPRA XR) 500 MG 24 hr tablet Take 1,000 mg by mouth at bedtime.    . metFORMIN (GLUCOPHAGE-XR) 750 MG 24 hr tablet Take 500 mg by mouth 2 (two) times daily.      No facility-administered medications prior to visit.     PAST MEDICAL HISTORY: Past Medical History:  Diagnosis Date  . Diabetes mellitus without complication ([place])   . Hyperlipemia   . Hypertension   . large right middle cerebral artery infarct, embolic  06/05/863   a. s/p IV tPA, b. source unknown, c. loop recorder placed  . Snoring 07/03/2015    PAST SURGICAL HISTORY: Past Surgical History:  Procedure Laterality Date  . LOOP RECORDER IMPLANT  06/07/2013   MDT LinQ implanted by Dr [name] for cryptogenic stroke  . LOOP RECORDER IMPLANT N/A 06/07/2013   Procedure: LOOP RECORDER IMPLANT;  Surgeon: [name], MD;  Location: [facility] CATH LAB;  Service: Cardiovascular;  Laterality: N/A;  .  TEE WITHOUT CARDIOVERSION N/A 06/07/2013   Procedure: TRANSESOPHAGEAL ECHOCARDIOGRAM (TEE);  Surgeon: [name], MD;  Location: [facility] ENDOSCOPY;  Service: Cardiovascular;  Laterality: N/A;    FAMILY HISTORY: Family History  Problem Relation Age of Onset  . Dementia Mother   . Diabetes Mother   . Heart attack Father     SOCIAL HISTORY: Social History   Socioeconomic History  . Marital status: Married    Spouse name: [name]  . Number of children: 2  . Years of education: College  . Highest education level: Not on file  Occupational History  . Occupation: Retired    Fish farm manager: Psychologist, educational  Social Needs  . Financial resource strain: Not on file  . Food insecurity:    Worry: Not on file    Inability: Not on file  . Transportation needs:    Medical: Not on file    Non-medical: Not on file  Tobacco Use  . Smoking status: Former Research scientist (life sciences)  . Smokeless tobacco: Never Used  . Tobacco comment: Quit 25 years ago.  Substance and Sexual Activity  . Alcohol use: Yes    Alcohol/week: 1.0 standard drinks    Types: 1 Glasses of wine per week    Comment: Rare  . Drug use: No  . Sexual activity: Never  Lifestyle  . Physical activity:    Days per week: Not on file    Minutes per session: Not on file  . Stress: Not on file  Relationships  . Social connections:    Talks on phone: Not on file    Gets together: Not on file    Attends religious service: Not on file    Active member of club or organization: Not on file    Attends meetings of clubs  or organizations: Not on file    Relationship status: Not on file  . Intimate partner violence:    Fear of current or ex partner: Not on file    Emotionally abused: Not on file    Physically abused: Not on file    Forced sexual activity: Not on file  Other Topics Concern  . Not on file  Social History Narrative   Patient lives at home [name]).   Retired works part time 10 hours a week.   Right handed   Education college   Caffeine: one cup daily coffee   No soda or tea.      PHYSICAL EXAM  Vitals:   07/15/18 1457 07/15/18 1500  BP: (!) 153/87 (!) 144/82  Pulse: 69 69  Weight: 230 lb (104.3 kg)   Height: '6\' 3"'$  (1.905 m)    Body mass index is 28.75 kg/m.  Generalized:  Pleasant middle-age Caucasian male not in distress. . Afebrile. Head is nontraumatic. Neck is supple without bruit.    Cardiac exam no murmur or gallop. Lungs are clear to auscultation. Distal pulses are well felt.  Neurological examination  Mentation: Alert oriented to time, place, history taking. Follows all commands speech and language fluent Cranial nerve II-XII: Pupils were equal round reactive to light. Extraocular movements were full, visual field were full on confrontational test. Facial sensation and strength were normal. Uvula tongue midline. Head turning and shoulder shrug  were normal and symmetric. Motor: The motor testing reveals 5 over 5 strength of all 4 extremities. LUE 4/5 with  Increased tone left leg  . Slight left foot drop. Sensory: Sensory testing is intact to soft touch on all 4 extremities. No evidence of extinction is  noted.  Coordination: Cerebellar testing reveals good finger-nose-finger and heel-to-shin bilaterally.  Gait and station: Gait is spastic hemiplegic with left foot drop and wearing AFO  Reflexes: Deep tendon reflexes are asymmetric and brisker on the left side  DIAGNOSTIC DATA (LABS, IMAGING, TESTING) - I reviewed patient records, labs, notes, testing and imaging  myself where available.  Lab Results  Component Value Date   WBC 10.7 (H) 09/19/2014   HGB 13.9 09/19/2014   HCT 41.0 09/19/2014   MCV 92.3 09/19/2014   PLT 216 09/19/2014      Component Value Date/Time   NA 137 09/19/2014 1908   K 3.3 (L) 09/19/2014 1908   CL 99 09/19/2014 1908   CO2 20 09/19/2014 1856   GLUCOSE 131 (H) 09/19/2014 1908   BUN 18 09/19/2014 1908   CREATININE 1.00 09/19/2014 1908   CALCIUM 9.1 09/19/2014 1856   PROT 7.0 09/19/2014 1856   ALBUMIN 3.9 09/19/2014 1856   AST 24 09/19/2014 1856   ALT 13 09/19/2014 1856   ALKPHOS 58 09/19/2014 1856   BILITOT 1.1 09/19/2014 1856   GFRNONAA 65 (L) 09/19/2014 1856   GFRAA 76 (L) 09/19/2014 1856   Lab Results  Component Value Date   CHOL 139 09/20/2014   HDL 28 (L) 09/20/2014   LDLCALC 68 09/20/2014   TRIG 214 (H) 09/20/2014   CHOLHDL 5.0 09/20/2014   Lab Results  Component Value Date   HGBA1C 6.6 (H) 09/20/2014   No results found for: VITAMINB12 No results found for: TSH    ASSESSMENT AND PLAN 73 y.o. year old male  has a past medical history of Diabetes mellitus without complication ([place]), Hyperlipemia, Hypertension, large right middle cerebral artery infarct, embolic (06/08/7937), and Snoring (07/03/2015). here with:  1. 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 Seizures stable  I had a long discussion with the patient regarding his remote stroke, atrial fibrillation and symptomatic seizures.  I recommend he continue Eliquis for stroke prevention and maintain aggressive risk factor modification with strict control of hypertension with blood pressure goal below 130/90, lipids with LDL cholesterol goal below 70 mg percent and hemoglobin A1c goal below 6.5.  He was advised to continue Keppra XR in the current dose of '1000mg'$   at night.  He was given a refill for a year.  The patient plans on having penile implant surgery soon.  He is neurologically cleared with a small but acceptable  periprocedural risk of stroke or TIA when he holds the Eliquis for 2 days prior to the procedure and resume soon after the procedure.  He understands the risk and plans to go ahead.  Also check screening follow-up carotid ultrasound study as he has not had it for several years he will return for follow-up in the future in a year or call earlier if necessary Greater than 50% time during this 25 minute visit was spent on counseling and coordination of care about his spastic gait, left foot drop, seizures and stroke risk.He may return back in the future only as necessary.   Antony Contras, MD 07/15/2018, 3:44 PM [facility] [address], [place], [place] [address] [phone]  Personally  participated in and made any corrections needed to history, physical, neuro exam,assessment and plan as stated above.   Sarina Ill, MD [facility]

## 2018-07-15 NOTE — Patient Instructions (Signed)
I had a long discussion with the patient regarding his remote stroke, atrial fibrillation and symptomatic seizures.  I recommend he continue Eliquis for stroke prevention and maintain aggressive risk factor modification with strict control of hypertension with blood pressure goal below 130/90, lipids with LDL cholesterol goal below 70 mg percent and hemoglobin A1c goal below 6.5.  He was advised to continue Keppra XR in the current dose of 1000mg   at night.  He was given a refill for a year.  The patient plans on having penile implant surgery soon.  He is neurologically cleared with a small but acceptable periprocedural risk of stroke or TIA when he holds the Eliquis for 2 days prior to the procedure and resume soon after the procedure.  He understands the risk and plans to go ahead.  Also check screening follow-up carotid ultrasound study as he has not had it for several years he will return for follow-up in the future in a year or call earlier if necessary

## 2018-07-26 ENCOUNTER — Ambulatory Visit (HOSPITAL_COMMUNITY)
Admission: RE | Admit: 2018-07-26 | Discharge: 2018-07-26 | Disposition: A | Discharge status: Home / Self Care | Payer: Medicare Other | Source: Ambulatory Visit | Attending: Neurology | Admitting: Neurology

## 2018-07-26 DIAGNOSIS — I6529 Occlusion and stenosis of unspecified carotid artery: Secondary | ICD-10-CM | POA: Diagnosis not present

## 2018-07-26 NOTE — Progress Notes (Signed)
Bilateral carotid duplex completed. Preliminary results in Chart Review CV Proc. [place] Pebbles Zeiders,RVS 07/26/2018, 2:09 PM

## 2018-07-29 ENCOUNTER — Telehealth: Payer: Self-pay | Admitting: *Deleted

## 2018-07-29 NOTE — Telephone Encounter (Signed)
-----   Message from Garvin Fila, MD sent at 07/28/2018  4:44 PM EST ----- Kindly inform the patient that carotid ultrasound study shows no significant blockages in the neck

## 2018-07-29 NOTE — Telephone Encounter (Signed)
LMOM with below u/s report. Pt. does not need to return this call unless he has questions/fim

## 2018-08-12 DIAGNOSIS — I6930 Unspecified sequelae of cerebral infarction: Secondary | ICD-10-CM | POA: Diagnosis not present

## 2018-08-12 DIAGNOSIS — I10 Essential (primary) hypertension: Secondary | ICD-10-CM | POA: Diagnosis not present

## 2018-08-12 DIAGNOSIS — G40909 Epilepsy, unspecified, not intractable, without status epilepticus: Secondary | ICD-10-CM | POA: Diagnosis not present

## 2018-08-12 DIAGNOSIS — I4891 Unspecified atrial fibrillation: Secondary | ICD-10-CM | POA: Diagnosis not present

## 2018-08-12 DIAGNOSIS — E1169 Type 2 diabetes mellitus with other specified complication: Secondary | ICD-10-CM | POA: Diagnosis not present

## 2018-08-12 DIAGNOSIS — N529 Male erectile dysfunction, unspecified: Secondary | ICD-10-CM | POA: Diagnosis not present

## 2018-08-29 ENCOUNTER — Other Ambulatory Visit: Payer: Self-pay | Admitting: Internal Medicine

## 2018-08-30 NOTE — Telephone Encounter (Signed)
Age 73, weight 104kg, SCr 1.06 from 05/14/18 in [place]. Last OV 05/24/18, refill sent in.

## 2018-09-29 DIAGNOSIS — E1169 Type 2 diabetes mellitus with other specified complication: Secondary | ICD-10-CM | POA: Diagnosis not present

## 2018-11-19 DIAGNOSIS — I4891 Unspecified atrial fibrillation: Secondary | ICD-10-CM | POA: Diagnosis not present

## 2018-11-19 DIAGNOSIS — E119 Type 2 diabetes mellitus without complications: Secondary | ICD-10-CM | POA: Diagnosis not present

## 2018-11-19 DIAGNOSIS — I639 Cerebral infarction, unspecified: Secondary | ICD-10-CM | POA: Diagnosis not present

## 2018-11-19 DIAGNOSIS — Z7984 Long term (current) use of oral hypoglycemic drugs: Secondary | ICD-10-CM | POA: Diagnosis not present

## 2018-11-19 DIAGNOSIS — N529 Male erectile dysfunction, unspecified: Secondary | ICD-10-CM | POA: Diagnosis not present

## 2018-11-19 DIAGNOSIS — I10 Essential (primary) hypertension: Secondary | ICD-10-CM | POA: Diagnosis not present

## 2018-11-19 DIAGNOSIS — Z125 Encounter for screening for malignant neoplasm of prostate: Secondary | ICD-10-CM | POA: Diagnosis not present

## 2018-11-19 DIAGNOSIS — Z0000 Encounter for general adult medical examination without abnormal findings: Secondary | ICD-10-CM | POA: Diagnosis not present

## 2018-12-16 DIAGNOSIS — Z7984 Long term (current) use of oral hypoglycemic drugs: Secondary | ICD-10-CM | POA: Diagnosis not present

## 2018-12-16 DIAGNOSIS — Z0000 Encounter for general adult medical examination without abnormal findings: Secondary | ICD-10-CM | POA: Diagnosis not present

## 2018-12-16 DIAGNOSIS — Z13220 Encounter for screening for lipoid disorders: Secondary | ICD-10-CM | POA: Diagnosis not present

## 2018-12-16 DIAGNOSIS — Z125 Encounter for screening for malignant neoplasm of prostate: Secondary | ICD-10-CM | POA: Diagnosis not present

## 2018-12-16 DIAGNOSIS — I4891 Unspecified atrial fibrillation: Secondary | ICD-10-CM | POA: Diagnosis not present

## 2018-12-16 DIAGNOSIS — E119 Type 2 diabetes mellitus without complications: Secondary | ICD-10-CM | POA: Diagnosis not present

## 2018-12-16 DIAGNOSIS — I639 Cerebral infarction, unspecified: Secondary | ICD-10-CM | POA: Diagnosis not present

## 2019-02-10 ENCOUNTER — Other Ambulatory Visit: Payer: Self-pay | Admitting: Internal Medicine

## 2019-02-10 NOTE — Telephone Encounter (Signed)
Prescription refill request for Eliquis received.  Last office visit: [place] (05-14-2018) Scr: 0.95 ( 12-16-2018) Age: 73 y.o. Weight: 104.3kg (07-15-2018)  Prescription refill sent.

## 2019-03-01 DIAGNOSIS — I6930 Unspecified sequelae of cerebral infarction: Secondary | ICD-10-CM | POA: Diagnosis not present

## 2019-03-01 DIAGNOSIS — G40909 Epilepsy, unspecified, not intractable, without status epilepticus: Secondary | ICD-10-CM | POA: Diagnosis not present

## 2019-03-01 DIAGNOSIS — E1169 Type 2 diabetes mellitus with other specified complication: Secondary | ICD-10-CM | POA: Diagnosis not present

## 2019-03-01 DIAGNOSIS — W19XXXA Unspecified fall, initial encounter: Secondary | ICD-10-CM | POA: Diagnosis not present

## 2019-03-01 DIAGNOSIS — F39 Unspecified mood [affective] disorder: Secondary | ICD-10-CM | POA: Diagnosis not present

## 2019-03-08 DIAGNOSIS — Z23 Encounter for immunization: Secondary | ICD-10-CM | POA: Diagnosis not present

## 2019-03-10 ENCOUNTER — Ambulatory Visit: Payer: Medicare Other | Attending: Family Medicine | Admitting: Physical Therapy

## 2019-03-10 ENCOUNTER — Other Ambulatory Visit: Payer: Self-pay

## 2019-03-10 DIAGNOSIS — Z9181 History of falling: Secondary | ICD-10-CM | POA: Diagnosis not present

## 2019-03-10 DIAGNOSIS — M6281 Muscle weakness (generalized): Secondary | ICD-10-CM | POA: Insufficient documentation

## 2019-03-10 DIAGNOSIS — R262 Difficulty in walking, not elsewhere classified: Secondary | ICD-10-CM | POA: Insufficient documentation

## 2019-03-13 NOTE — Patient Instructions (Signed)
Access Code: [facility]  URL: [email or web address]  Date: 03/13/2019  Prepared by: Jari Favre   Exercises  Sit to Stand with Armchair - 10 reps - 1 sets - 3x daily - 7x weekly

## 2019-03-13 NOTE — Therapy (Signed)
[facility] [address] [place], [place], [address] Phone: [phone]   Fax:  [phone]  Physical Therapy Evaluation  Patient Details  Name: Donald [name] MRN: [id number] Date of Birth: 11-22-1945 Referring Provider (PT): London Pepper, MD   Encounter Date: 03/10/2019  PT End of Session - 03/13/19 1539    Visit Number  1    Date for PT Re-Evaluation  05/05/19    PT Start Time  [id number]    PT Stop Time  1700    PT Time Calculation (min)  50 min    Activity Tolerance  Patient tolerated treatment well    Behavior During Therapy  [facility] for tasks assessed/performed       Past Medical History:  Diagnosis Date  . Diabetes mellitus without complication ([place])   . Hyperlipemia   . Hypertension   . large right middle cerebral artery infarct, embolic [id number]   a. s/p IV tPA, b. source unknown, c. loop recorder placed  . Snoring 07/03/2015    Past Surgical History:  Procedure Laterality Date  . LOOP RECORDER IMPLANT  06/07/2013   MDT LinQ implanted by Dr [name] for cryptogenic stroke  . LOOP RECORDER IMPLANT N/A 06/07/2013   Procedure: LOOP RECORDER IMPLANT;  Surgeon: [name], MD;  Location: [place] CATH LAB;  Service: Cardiovascular;  Laterality: N/A;  . TEE WITHOUT CARDIOVERSION N/A 06/07/2013   Procedure: TRANSESOPHAGEAL ECHOCARDIOGRAM (TEE);  Surgeon: [name], MD;  Location: [place];  Service: Cardiovascular;  Laterality: N/A;    There were no vitals filed for this visit.   Subjective Assessment - 03/13/19 1539    Subjective  Pt states he was walking 40 minutes per day.  Pt states he has noticed he is holding his holding his left arm in flexion more and his gait is getting off more.  States he is getting more out of focus.    Pertinent History  06/03/2013 stroke causing Lt sided weakness, Goes by "Donald [name]"    Limitations  Walking    How long can you walk comfortably?  30-45 min (just in the last week) -  my gait is a struggle where it used to be steady    Currently in Pain?  No/denies         [facility] PT Assessment - 03/13/19 0001      Assessment   Medical Diagnosis  W19.XXXA (ICD-10-CM) - Unspecified fall, initial encounter;M62.81 (ICD-10-CM) - Left-sided muscle weakness;M21.372 (ICD-10-CM) - Left foot drop    Referring Provider (PT)  London Pepper, MD    Onset Date/Surgical Date  --   decline of function and falls over the last 4 months   Prior Therapy  Yes right after he had the stroke in 2015-2017      Precautions   Precautions  None      Restrictions   Weight Bearing Restrictions  No      [place] residence      Prior Function   Level of Independence  Independent    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              PT Education - 03/13/19 1537    Education Details  Access Code: [facility] URL: [email or web address] Date: 03/13/2019 Prepared by: Jari Favre  Exercises Sit to Stand with Armchair - 10 reps - 1 sets - 3x daily - 7x weekly    Person(s) Educated  Patient    Methods  Explanation;Demonstration;Handout;Verbal cues    Comprehension  Verbalized understanding;Returned demonstration       PT Short Term Goals - 03/13/19 1516      PT SHORT TERM GOAL #1   Title  5 x sit to  stand < 30 seconds    Time  4    Period  Weeks    Status  New    Target Date  04/07/19      PT SHORT TERM GOAL #2   Title  ind with initial HEP    Time  4    Period  Weeks    Status  New    Target Date  04/07/19        PT Long Term Goals - 03/10/19 1627      PT LONG TERM GOAL #1   Title  Pt will report feeling like his gait feels 75% back to normal    Time  8    Period  Weeks    Status  New    Target Date  05/05/19      PT LONG TERM GOAL #2   Title  Pt will be able to walk at least 850 ft during 6MWT due to improved cadence back to his baseline.    Time  8    Period  Weeks    Status  New    Target Date  05/05/19      PT LONG TERM GOAL #3   Title  Pt will perform 5 x sit to stand in < 24 seconds due to improved strength and reduced risk of falls    Baseline  34 seconds    Time  8    Period  Weeks    Status  New    Target Date  05/05/19      PT LONG TERM GOAL #4   Title  Pt will be ind with HEP to maintain improvements made during time working with skilled PT    Time  8    Period  Weeks    Status  New    Target Date  05/05/19      PT LONG TERM GOAL #5   Title  .Marland KitchenMarland Kitchen             Plan - 03/13/19 1518    Clinical Impression Statement  Pt presents to skilled PT due to falling more frequently over the last 4-6 months . PT has Lt sided weakness and foot drop due to a stroke that he experienced several years ago.  Pt has 5x sit to stand test that places him at high risk for falls.  His Lt  LE is 4-/5. He demonstrates gait and posture abnormalities as mentioned.  Pt has much lower than average 6MWT based on age related norms and he used to be able to walk at a faster pace.  Since things have come up in his life recently that have taken him away from his usual exercise and self-care routine, he has declined.  Pt will benefit from skilled PT to address impairments and deconditioned state.    Personal Factors and Comorbidities  Age;Comorbidity 1    Comorbidities  stoke     Examination-Activity Limitations  Locomotion Level;Stairs    Stability/Clinical Decision Making  Evolving/Moderate complexity    Clinical Decision Making  Moderate    Rehab Potential  Excellent    PT Frequency  2x / week    PT Treatment/Interventions  ADLs/Self Care Home Management;Biofeedback;Cryotherapy;Electrical Stimulation;Moist Heat;Therapeutic activities;Therapeutic exercise;Gait training;Neuromuscular re-education;Patient/family education;Manual techniques;Passive range of motion;Dry needling;Taping    PT Next Visit Plan  nustep and walking for endurance, LE strength, posture, sit to stand    Consulted and Agree with Plan of Care  Patient       Patient will benefit from skilled therapeutic intervention in order to improve the following deficits and impairments:  Abnormal gait, Decreased coordination, Postural dysfunction, Decreased strength, Increased fascial restricitons, Increased muscle spasms  Visit Diagnosis: Difficulty in walking, not elsewhere classified  Muscle weakness (generalized)  History of falling     Problem List Patient Active Problem List   Diagnosis Date Noted  . Snoring 07/03/2015  . Persistent atrial fibrillation ([place]) 04/16/2015  . Hypokalemia 09/20/2014  . HTN (hypertension) 09/19/2014  . HLD (hyperlipidemia) 09/19/2014  . Diabetes mellitus without complication ([place]) [id number]  . Jerking 09/19/2014  . Alterations of sensations, late effect of cerebrovascular disease 11/07/2013  . Disturbances of vision, late effect of cerebrovascular disease 11/07/2013  . Sinus bradycardia 08/08/2013  . 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[place]) 07/26/2013  . Left-sided neglect 07/26/2013  . Small vessel disease ([place]) 06/07/2013  . Obesity, unspecified 06/07/2013  . CVA (cerebral infarction) 06/07/2013  . large right middle cerebral artery infarct, embolic [id number]  . Hypertension 06/03/2013  . Diabetes ([place]) 06/03/2013    Jule Ser,  PT 03/13/2019, 3:39 PM  [facility] [address], [place], [place], [address] Phone: [phone]   Fax:  [phone]  Name: Donald [name] MRN: [id number] Date of Birth: 25-May-1946

## 2019-03-14 ENCOUNTER — Ambulatory Visit: Payer: Medicare Other | Admitting: Physical Therapy

## 2019-03-14 ENCOUNTER — Other Ambulatory Visit: Payer: Self-pay

## 2019-03-14 ENCOUNTER — Encounter: Payer: Self-pay | Admitting: Physical Therapy

## 2019-03-14 DIAGNOSIS — M6281 Muscle weakness (generalized): Secondary | ICD-10-CM | POA: Diagnosis not present

## 2019-03-14 DIAGNOSIS — R262 Difficulty in walking, not elsewhere classified: Secondary | ICD-10-CM | POA: Diagnosis not present

## 2019-03-14 DIAGNOSIS — Z9181 History of falling: Secondary | ICD-10-CM

## 2019-03-14 NOTE — Patient Instructions (Signed)
Updates to  Access Code: [email or web address]

## 2019-03-14 NOTE — Therapy (Signed)
[facility] [address], [place], [place], [address] Phone: [phone]   Fax:  [phone]  Physical Therapy Treatment  Patient Details  Name: Donald [name] MRN: [id number] Date of Birth: 06/11/1945 Referring Provider (PT): London Pepper, MD   Encounter Date: 03/14/2019  PT End of Session - 03/14/19 1528    Visit Number  2    Date for PT Re-Evaluation  05/05/19    PT Start Time  1528    PT Stop Time  [id number]    PT Time Calculation (min)  46 min    Activity Tolerance  Patient tolerated treatment well    Behavior During Therapy  [facility] for tasks assessed/performed       Past Medical History:  Diagnosis Date  . Diabetes mellitus without complication ([place])   . Hyperlipemia   . Hypertension   . large right middle cerebral artery infarct, embolic [id number]   a. s/p IV tPA, b. source unknown, c. loop recorder placed  . Snoring 07/03/2015    Past Surgical History:  Procedure Laterality Date  . LOOP RECORDER IMPLANT  06/07/2013   MDT LinQ implanted by Dr [name] for cryptogenic stroke  . LOOP RECORDER IMPLANT N/A 06/07/2013   Procedure: LOOP RECORDER IMPLANT;  Surgeon: [name], MD;  Location: [place] [facility] CATH LAB;  Service: Cardiovascular;  Laterality: N/A;  . TEE WITHOUT CARDIOVERSION N/A 06/07/2013   Procedure: TRANSESOPHAGEAL ECHOCARDIOGRAM (TEE);  Surgeon: [name], MD;  Location: [place];  Service: Cardiovascular;  Laterality: N/A;    There were no vitals filed for this visit.  Subjective Assessment - 03/14/19 1529    Subjective  Pt states he walked but the rain messed it up a little bit.    How long can you walk comfortably?  30-45 min (just in the last week) - my gait is a struggle where it used to be steady    Currently in Pain?  No/denies                       [facility] Adult PT Treatment/Exercise - 03/14/19 0001      Exercises   Exercises  Knee/Hip      Knee/Hip Exercises: Aerobic    Nustep  seat 13; L3 x 10 min    PT present for status update     Knee/Hip Exercises: Machines for Strengthening   Cybex Leg Press  bilat seat 9 - 60# 30x - cues to keep knee hip more adducted      Knee/Hip Exercises: Standing   Knee Flexion  Strengthening;Right;Left;2 sets;10 reps    Hip Flexion  Stengthening;Right;Left;20 reps;Knee bent   tapping step     Knee/Hip Exercises: Seated   Long Arc Quad  Strengthening;Left;20 reps;Weights    Long Arc Quad Weight  2 lbs.    Knee/Hip Flexion  20x Lt LE 2 lb      Knee/Hip Exercises: Supine   Other Supine Knee/Hip Exercises  clam red band - 20x cues to engage core and glutes             PT Education - 03/14/19 1717    Education Details  Access Code: [email or web address]    Person(s) Educated  Patient    Methods  Explanation;Demonstration;Handout;Verbal cues    Comprehension  Verbalized understanding;Returned demonstration       PT Short Term Goals - 03/13/19 1516      PT SHORT TERM GOAL #1   Title  5 x sit  to stand < 30 seconds    Time  4    Period  Weeks    Status  New    Target Date  04/07/19      PT SHORT TERM GOAL #2   Title  ind with initial HEP    Time  4    Period  Weeks    Status  New    Target Date  04/07/19        PT Long Term Goals - 03/10/19 1627      PT LONG TERM GOAL #1   Title  Pt will report feeling like his gait feels 75% back to normal    Time  8    Period  Weeks    Status  New    Target Date  05/05/19      PT LONG TERM GOAL #2   Title  Pt will be able to walk at least 850 ft during 6MWT due to improved cadence back to his baseline.    Time  8    Period  Weeks    Status  New    Target Date  05/05/19      PT LONG TERM GOAL #3   Title  Pt will perform 5 x sit to stand in < 24 seconds due to improved strength and reduced risk of falls    Baseline  34 seconds    Time  8    Period  Weeks    Status  New    Target Date  05/05/19      PT LONG TERM GOAL #4   Title  Pt will be ind with HEP to  maintain improvements made during time working with skilled PT    Time  8    Period  Weeks    Status  New    Target Date  05/05/19      PT LONG TERM GOAL #5   Title  ...            Plan - 03/14/19 1718    Clinical Impression Statement  Pt did well with exercises today.  He had increased hyperextension of the Lt knee when his quad became fatigued.  pt needed moderate cues to engage his abdominal muslces correctly with supine and seated exercises.  Pt will cotinue to benefit from skilled PT to work on imporved strengh, stability, and gait.    PT Treatment/Interventions  ADLs/Self Care Home Management;Biofeedback;Cryotherapy;Electrical Stimulation;Moist Heat;Therapeutic activities;Therapeutic exercise;Gait training;Neuromuscular re-education;Patient/family education;Manual techniques;Passive range of motion;Dry needling;Taping    PT Next Visit Plan  nustep and walking for endurance, LE strength, posture, sit to stand    PT Home Exercise Plan  Access Code: [email or web address]    Consulted and Agree with Plan of Care  Patient       Patient will benefit from skilled therapeutic intervention in order to improve the following deficits and impairments:  Abnormal gait, Decreased coordination, Postural dysfunction, Decreased strength, Increased fascial restricitons, Increased muscle spasms  Visit Diagnosis: Difficulty in walking, not elsewhere classified  Muscle weakness (generalized)  History of falling     Problem List Patient Active Problem List   Diagnosis Date Noted  . Snoring 07/03/2015  . Persistent atrial fibrillation ([place]) 04/16/2015  . Hypokalemia 09/20/2014  . HTN (hypertension) 09/19/2014  . HLD (hyperlipidemia) 09/19/2014  . Diabetes mellitus without complication ([place]) [id number]  . Jerking 09/19/2014  . Alterations of sensations, late effect of cerebrovascular disease 11/07/2013  . Disturbances of vision, late effect of cerebrovascular  disease 11/07/2013  . Sinus bradycardia  08/08/2013  . 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[place]) 07/26/2013  . Left-sided neglect 07/26/2013  . Small vessel disease ([place]) 06/07/2013  . Obesity, unspecified 06/07/2013  . CVA (cerebral infarction) 06/07/2013  . large right middle cerebral artery infarct, embolic [id number]  . Hypertension 06/03/2013  . Diabetes ([place]) 06/03/2013    Jule Ser, PT 03/14/2019, 5:21 PM   [facility] [address], [place], [place], [address] Phone: [phone]   Fax:  [phone]  Name: Donald [name] MRN: [id number] Date of Birth: 11/17/1945

## 2019-03-17 ENCOUNTER — Encounter: Payer: Self-pay | Admitting: Physical Therapy

## 2019-03-17 ENCOUNTER — Ambulatory Visit: Payer: Medicare Other | Admitting: Physical Therapy

## 2019-03-17 ENCOUNTER — Other Ambulatory Visit: Payer: Self-pay

## 2019-03-17 DIAGNOSIS — Z9181 History of falling: Secondary | ICD-10-CM

## 2019-03-17 DIAGNOSIS — M6281 Muscle weakness (generalized): Secondary | ICD-10-CM | POA: Diagnosis not present

## 2019-03-17 DIAGNOSIS — R262 Difficulty in walking, not elsewhere classified: Secondary | ICD-10-CM | POA: Diagnosis not present

## 2019-03-17 NOTE — Therapy (Signed)
[facility] [address] [place], [place], [address] Phone: [phone]   Fax:  [phone]  Physical Therapy Treatment  Patient Details  Name: Donald [name] MRN: [id number] Date of Birth: 10/02/1945 Referring Provider (PT): London Pepper, MD   Encounter Date: 03/17/2019  PT End of Session - 03/17/19 [id number]    Visit Number  3    Date for PT Re-Evaluation  05/05/19    PT Start Time  1227    PT Stop Time  1312    PT Time Calculation (min)  45 min    Activity Tolerance  Patient tolerated treatment well    Behavior During Therapy  [facility] for tasks assessed/performed       Past Medical History:  Diagnosis Date  . Diabetes mellitus without complication ([place])   . Hyperlipemia   . Hypertension   . large right middle cerebral artery infarct, embolic [id number]   a. s/p IV tPA, b. source unknown, c. loop recorder placed  . Snoring 07/03/2015    Past Surgical History:  Procedure Laterality Date  . LOOP RECORDER IMPLANT  06/07/2013   MDT LinQ implanted by Dr [name] for cryptogenic stroke  . LOOP RECORDER IMPLANT N/A 06/07/2013   Procedure: LOOP RECORDER IMPLANT;  Surgeon: [name], MD;  Location: [place] CATH LAB;  Service: Cardiovascular;  Laterality: N/A;  . TEE WITHOUT CARDIOVERSION N/A 06/07/2013   Procedure: TRANSESOPHAGEAL ECHOCARDIOGRAM (TEE);  Surgeon: [name], MD;  Location: [place];  Service: Cardiovascular;  Laterality: N/A;    There were no vitals filed for this visit.  Subjective Assessment - 03/17/19 1230    Subjective  Pt reports he is doing well today.  The last time he walked he started feeling better and was able to go longer    How long can you walk comfortably?  30-45 min (just in the last week) - my gait is a struggle where it used to be steady    Currently in Pain?  No/denies                       [facility] Adult PT Treatment/Exercise - 03/17/19 0001      Self-Care   Self-Care  Posture      Neuro Re-ed    Neuro Re-ed Details   elbow ext 1 plate for posture - [id number]      Knee/Hip Exercises: Aerobic   Nustep  seat 13; L3 x 10 min    PT present for status update     Knee/Hip Exercises: Machines for Strengthening   Cybex Leg Press  bilat seat 9 - 70# 30x - cues to keep knee hip more adducted      Knee/Hip Exercises: Standing   Heel Raises  Both;20 reps    Knee Flexion  Strengthening;Right;Left;2 sets;10 reps   2#   Hip Flexion  Stengthening;Right;Left;20 reps;Knee straight   2#   Hip Abduction  Stengthening;Right;Left   2#     Knee/Hip Exercises: Seated   Long Arc Quad  Strengthening;Left;20 reps;Weights    Long Arc Quad Weight  2 lbs.    Knee/Hip Flexion  20x Lt LE 2 lb       weight shifting heel to toe for proprioception        PT Short Term Goals - 03/17/19 1316      PT SHORT TERM GOAL #2   Title  ind with initial HEP    Status  Achieved  PT Long Term Goals - 03/10/19 1627      PT LONG TERM GOAL #1   Title  Pt will report feeling like his gait feels 75% back to normal    Time  8    Period  Weeks    Status  New    Target Date  05/05/19      PT LONG TERM GOAL #2   Title  Pt will be able to walk at least 850 ft during 6MWT due to improved cadence back to his baseline.    Time  8    Period  Weeks    Status  New    Target Date  05/05/19      PT LONG TERM GOAL #3   Title  Pt will perform 5 x sit to stand in < 24 seconds due to improved strength and reduced risk of falls    Baseline  34 seconds    Time  8    Period  Weeks    Status  New    Target Date  05/05/19      PT LONG TERM GOAL #4   Title  Pt will be ind with HEP to maintain improvements made during time working with skilled PT    Time  8    Period  Weeks    Status  New    Target Date  05/05/19      PT LONG TERM GOAL #5   Title  ...            Plan - 03/17/19 1314    Clinical Impression Statement  Pt doing well and feels like his walking is  more smooth.  Pt has improved posture after doing elbow extensions.  Pt was able to add to exercises today and added weights to standing exercise.  Continue with POC at this time.    PT Treatment/Interventions  ADLs/Self Care Home Management;Biofeedback;Cryotherapy;Electrical Stimulation;Moist Heat;Therapeutic activities;Therapeutic exercise;Gait training;Neuromuscular re-education;Patient/family education;Manual techniques;Passive range of motion;Dry needling;Taping    PT Next Visit Plan  nustep and walking for endurance, LE strength, posture, sit to stand    PT Home Exercise Plan  Access Code: [email or web address]    Consulted and Agree with Plan of Care  Patient       Patient will benefit from skilled therapeutic intervention in order to improve the following deficits and impairments:  Abnormal gait, Decreased coordination, Postural dysfunction, Decreased strength, Increased fascial restricitons, Increased muscle spasms  Visit Diagnosis: Difficulty in walking, not elsewhere classified  Muscle weakness (generalized)  History of falling     Problem List Patient Active Problem List   Diagnosis Date Noted  . Snoring 07/03/2015  . Persistent atrial fibrillation ([place]) 04/16/2015  . Hypokalemia 09/20/2014  . HTN (hypertension) 09/19/2014  . HLD (hyperlipidemia) 09/19/2014  . Diabetes mellitus without complication ([place]) [id number]  . Jerking 09/19/2014  . Alterations of sensations, late effect of cerebrovascular disease 11/07/2013  . Disturbances of vision, late effect of cerebrovascular disease 11/07/2013  . Sinus bradycardia 08/08/2013  . 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[place]) 07/26/2013  . Left-sided neglect 07/26/2013  . Small vessel disease ([place]) 06/07/2013  . Obesity, unspecified 06/07/2013  . CVA (cerebral infarction) 06/07/2013  . large right middle cerebral artery infarct, embolic [id number]  . Hypertension 06/03/2013  . Diabetes ([place]) 06/03/2013    Jule Ser,  PT 03/17/2019, 1:16 PM  [place] [facility] [address], [place], [place], [address] Phone: [phone]   Fax:  [phone]  Name: Dhani Dannemiller MRN: [id number] Date of Birth: 19-Feb-1946

## 2019-03-22 ENCOUNTER — Other Ambulatory Visit: Payer: Self-pay

## 2019-03-22 ENCOUNTER — Ambulatory Visit: Payer: Medicare Other | Admitting: Physical Therapy

## 2019-03-22 ENCOUNTER — Encounter: Payer: Self-pay | Admitting: Physical Therapy

## 2019-03-22 DIAGNOSIS — Z9181 History of falling: Secondary | ICD-10-CM | POA: Diagnosis not present

## 2019-03-22 DIAGNOSIS — R262 Difficulty in walking, not elsewhere classified: Secondary | ICD-10-CM

## 2019-03-22 DIAGNOSIS — M6281 Muscle weakness (generalized): Secondary | ICD-10-CM | POA: Diagnosis not present

## 2019-03-22 NOTE — Therapy (Signed)
[facility] [address], [place], [place], [address] Phone: [phone]   Fax:  [phone]  Physical Therapy Treatment  Patient Details  Name: Donald [name] MRN: [id number] Date of Birth: 03/27/1946 Referring Provider (PT): London Pepper, MD   Encounter Date: 03/22/2019  PT End of Session - 03/22/19 1449    Visit Number  4    Date for PT Re-Evaluation  05/05/19    PT Start Time  1444    PT Stop Time  1532    PT Time Calculation (min)  48 min    Activity Tolerance  Patient tolerated treatment well    Behavior During Therapy  [facility] for tasks assessed/performed       Past Medical History:  Diagnosis Date  . Diabetes mellitus without complication ([place])   . Hyperlipemia   . Hypertension   . large right middle cerebral artery infarct, embolic [id number]   a. s/p IV tPA, b. source unknown, c. loop recorder placed  . Snoring 07/03/2015    Past Surgical History:  Procedure Laterality Date  . LOOP RECORDER IMPLANT  06/07/2013   MDT LinQ implanted by Dr [name] for cryptogenic stroke  . LOOP RECORDER IMPLANT N/A 06/07/2013   Procedure: LOOP RECORDER IMPLANT;  Surgeon: [name], MD;  Location: [place] CATH LAB;  Service: Cardiovascular;  Laterality: N/A;  . TEE WITHOUT CARDIOVERSION N/A 06/07/2013   Procedure: TRANSESOPHAGEAL ECHOCARDIOGRAM (TEE);  Surgeon: [name], MD;  Location: [place];  Service: Cardiovascular;  Laterality: N/A;    There were no vitals filed for this visit.  Subjective Assessment - 03/22/19 1451    Subjective  I am doing better, I feel like I have a ways to go still.    Pertinent History  06/03/2013 stroke causing Lt sided weakness, Goes by "Donald [name]"    How long can you walk comfortably?  30-45 min (just in the last week) - my gait is a struggle where it used to be steady    Currently in Pain?  No/denies         [facility] PT Assessment - 03/22/19 0001      Standardized Balance  Assessment   Standardized Balance Assessment  Five Times Sit to Stand    Five times sit to stand comments   29   used hands on 3 reps                  [facility] Adult PT Treatment/Exercise - 03/22/19 0001      Knee/Hip Exercises: Aerobic   Nustep  seat and UE #13; L3 x 10 min    PT present for status update     Knee/Hip Exercises: Machines for Strengthening   Cybex Leg Press  bilat seat 9 - 80# 30x - cues to keep knee hip more adducted      Knee/Hip Exercises: Standing   Knee Flexion  Strengthening;Right;Left;2 sets;10 reps   2#   Hip Flexion  Stengthening;Right;Left;20 reps;Knee straight   2#   Hip Abduction  Stengthening;Right;Left   2#   Other Standing Knee Exercises  pwer tower - shoulder ext 15lb x 20; Lt UE elbow ext - 15lb x 10      Knee/Hip Exercises: Seated   Long Arc Quad  Strengthening;Left;20 reps;Weights    Long Arc Quad Weight  2 lbs.    Long [employer] Limitations  ball squeeze added    Sit to [employer]  5 reps;without UE support   x2 sets  stepping up and over foam roll - 15x each side        PT Short Term Goals - 03/17/19 1316      PT SHORT TERM GOAL #2   Title  ind with initial HEP    Status  Achieved        PT Long Term Goals - 03/22/19 1450      PT LONG TERM GOAL #1   Title  Pt will report feeling like his gait feels 75% back to normal    Baseline  My walking distance is the same but I am getting into the groove more quickly, walking 4 long blocks in 40-45 min    Status  On-going            Plan - 03/22/19 1722    Clinical Impression Statement  Pt did well with exercises and able to increase difficulty.  He demonstrates improved 5 s sit to stand even though  he did this just after nustep exercises and he was a little tired.  Pt will continue to benefit from skilled PT to work on strength and gait related exercises so he can return to full function with reduced risk of falls.    PT Treatment/Interventions  ADLs/Self Care Home  Management;Biofeedback;Cryotherapy;Electrical Stimulation;Moist Heat;Therapeutic activities;Therapeutic exercise;Gait training;Neuromuscular re-education;Patient/family education;Manual techniques;Passive range of motion;Dry needling;Taping    PT Next Visit Plan  nustep and walking for endurance, LE strength, posture, sit to stand    PT Home Exercise Plan  Access Code: [email or web address]    Consulted and Agree with Plan of Care  Patient       Patient will benefit from skilled therapeutic intervention in order to improve the following deficits and impairments:  Abnormal gait, Decreased coordination, Postural dysfunction, Decreased strength, Increased fascial restricitons, Increased muscle spasms  Visit Diagnosis: Difficulty in walking, not elsewhere classified  Muscle weakness (generalized)  History of falling     Problem List Patient Active Problem List   Diagnosis Date Noted  . Snoring 07/03/2015  . Persistent atrial fibrillation ([place]) 04/16/2015  . Hypokalemia 09/20/2014  . HTN (hypertension) 09/19/2014  . HLD (hyperlipidemia) 09/19/2014  . Diabetes mellitus without complication ([place]) [id number]  . Jerking 09/19/2014  . Alterations of sensations, late effect of cerebrovascular disease 11/07/2013  . Disturbances of vision, late effect of cerebrovascular disease 11/07/2013  . Sinus bradycardia 08/08/2013  . 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[place]) 07/26/2013  . Left-sided neglect 07/26/2013  . Small vessel disease ([place]) 06/07/2013  . Obesity, unspecified 06/07/2013  . CVA (cerebral infarction) 06/07/2013  . large right middle cerebral artery infarct, embolic [id number]  . Hypertension 06/03/2013  . Diabetes ([place]) 06/03/2013    Jule Ser, PT 03/22/2019, 5:24 PM  [facility] [address] [place], [place], [address] Phone: [phone]   Fax:  [phone]  Name: Donald [name] MRN:  [id number] Date of Birth: Feb 18, 1946

## 2019-03-24 ENCOUNTER — Other Ambulatory Visit: Payer: Self-pay

## 2019-03-24 ENCOUNTER — Encounter: Payer: Self-pay | Admitting: Physical Therapy

## 2019-03-24 ENCOUNTER — Ambulatory Visit: Payer: Medicare Other | Admitting: Physical Therapy

## 2019-03-24 DIAGNOSIS — M6281 Muscle weakness (generalized): Secondary | ICD-10-CM | POA: Diagnosis not present

## 2019-03-24 DIAGNOSIS — Z9181 History of falling: Secondary | ICD-10-CM

## 2019-03-24 DIAGNOSIS — R262 Difficulty in walking, not elsewhere classified: Secondary | ICD-10-CM | POA: Diagnosis not present

## 2019-03-24 NOTE — Therapy (Signed)
[facility] [address], [place], [place], [address] Phone: [phone]   Fax:  [phone]  Physical Therapy Treatment  Patient Details  Name: Donald [name] MRN: [id number] Date of Birth: 1946/03/16 Referring Provider (PT): London Pepper, MD   Encounter Date: 03/24/2019  PT End of Session - 03/24/19 1556    Visit Number  5    Date for PT Re-Evaluation  05/05/19    PT Start Time  [id number]    PT Stop Time  1610    PT Time Calculation (min)  40 min    Activity Tolerance  Patient tolerated treatment well    Behavior During Therapy  [facility] for tasks assessed/performed       Past Medical History:  Diagnosis Date  . Diabetes mellitus without complication ([place])   . Hyperlipemia   . Hypertension   . large right middle cerebral artery infarct, embolic [id number]   a. s/p IV tPA, b. source unknown, c. loop recorder placed  . Snoring 07/03/2015    Past Surgical History:  Procedure Laterality Date  . LOOP RECORDER IMPLANT  06/07/2013   MDT LinQ implanted by Dr [name] for cryptogenic stroke  . LOOP RECORDER IMPLANT N/A 06/07/2013   Procedure: LOOP RECORDER IMPLANT;  Surgeon: [name], MD;  Location: [place] CATH LAB;  Service: Cardiovascular;  Laterality: N/A;  . TEE WITHOUT CARDIOVERSION N/A 06/07/2013   Procedure: TRANSESOPHAGEAL ECHOCARDIOGRAM (TEE);  Surgeon: [name], MD;  Location: [facility];  Service: Cardiovascular;  Laterality: N/A;    There were no vitals filed for this visit.  Subjective Assessment - 03/24/19 1536    Subjective  Good start with PT.  It is helping.    Pertinent History  06/03/2013 stroke causing Lt sided weakness, Goes by "Donald [name]"    Limitations  Walking    How long can you walk comfortably?  30-45 min (just in the last week) - my gait is a struggle where it used to be steady    Currently in Pain?  No/denies                       [facility] Adult PT Treatment/Exercise -  03/24/19 0001      Ambulation/Gait   Ambulation Distance (Feet)  160 Feet   x2 with floor ladder step through each 40' lap   Gait Comments  floor ladder, see notes under distance (feet)      Knee/Hip Exercises: Machines for Strengthening   Cybex Leg Press  bil seat 9 x 80# x 13 reps   2 sets as circuit with all other ther ex     Knee/Hip Exercises: Standing   Knee Flexion  Strengthening;Both;2 sets;10 reps    Knee Flexion Limitations  2.52 sets as circuit with all other ther ex    Hip Flexion  Stengthening;Right;Left;Knee straight;2 sets;10 reps    Hip Flexion Limitations  2.5 2 sets as circuit with all other ther ex    Hip Abduction  Stengthening;Right;Left;2 sets;10 reps;Knee straight   2#   Abduction Limitations  2 sets as circuit with all other ther ex    Other Standing Knee Exercises  pwer tower - shoulder ext 15lb x 20; Lt UE elbow ext - 15lb x 10      Knee/Hip Exercises: Seated   Long Arc Quad  Strengthening;Both;2 sets;10 reps    Long Arc Quad Weight  3 lbs.    Long [employer] Limitations  2 sets as  circuit with all other ther ex    Sit to [place]  5 reps;with UE support;2 sets   2 sets as circuit with all other ther ex              PT Short Term Goals - 03/17/19 1316      PT SHORT TERM GOAL #2   Title  ind with initial HEP    Status  Achieved        PT Long Term Goals - 03/22/19 1450      PT LONG TERM GOAL #1   Title  Pt will report feeling like his gait feels 75% back to normal    Baseline  My walking distance is the same but I am getting into the groove more quickly, walking 4 long blocks in 40-45 min    Status  On-going            Plan - 03/24/19 1557    Clinical Impression Statement  Pt progressed to use of 2.5# ankle weights for all LE ther ex.  PT added floor ladder with gait endurance walking today which Pt was able to perform with step through pattern consistently without LOB.  PT organized session as circuit with 1 round of all exercises  performed twice with gait in between.  Pt needed one seated break due to Lt LE fatigue.  Otherwise excellent tolerance and Pt liked work rate. Continue current POC.    Personal Factors and Comorbidities  Age;Comorbidity 1    Comorbidities  stoke    Rehab Potential  Excellent    PT Frequency  2x / week    PT Treatment/Interventions  ADLs/Self Care Home Management;Biofeedback;Cryotherapy;Electrical Stimulation;Moist Heat;Therapeutic activities;Therapeutic exercise;Gait training;Neuromuscular re-education;Patient/family education;Manual techniques;Passive range of motion;Dry needling;Taping    PT Next Visit Plan  nustep and walking for endurance, LE strength, posture, sit to stand, continue floor ladder    PT Home Exercise Plan  Access Code: [email or web address]    Consulted and Agree with Plan of Care  Patient       Patient will benefit from skilled therapeutic intervention in order to improve the following deficits and impairments:     Visit Diagnosis: Difficulty in walking, not elsewhere classified  Muscle weakness (generalized)  History of falling     Problem List Patient Active Problem List   Diagnosis Date Noted  . Snoring 07/03/2015  . Persistent atrial fibrillation ([place]) 04/16/2015  . Hypokalemia 09/20/2014  . HTN (hypertension) 09/19/2014  . HLD (hyperlipidemia) 09/19/2014  . Diabetes mellitus without complication ([place]) [id number]  . Jerking 09/19/2014  . Alterations of sensations, late effect of cerebrovascular disease 11/07/2013  . Disturbances of vision, late effect of cerebrovascular disease 11/07/2013  . Sinus bradycardia 08/08/2013  . 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[place]) 07/26/2013  . Left-sided neglect 07/26/2013  . Small vessel disease ([place]) 06/07/2013  . Obesity, unspecified 06/07/2013  . CVA (cerebral infarction) 06/07/2013  . large right middle cerebral artery infarct, embolic [id number]  . Hypertension 06/03/2013  . Diabetes ([place]) 06/03/2013     Larra Crunkleton, PT 03/24/19 4:11 PM   [facility] [address] [place], [place], [address] Phone: [phone]   Fax:  [phone]  Name: Kaidon Castellano MRN: [id number] Date of Birth: 06-20-1945

## 2019-03-25 DIAGNOSIS — G40909 Epilepsy, unspecified, not intractable, without status epilepticus: Secondary | ICD-10-CM | POA: Diagnosis not present

## 2019-03-25 DIAGNOSIS — N529 Male erectile dysfunction, unspecified: Secondary | ICD-10-CM | POA: Diagnosis not present

## 2019-03-25 DIAGNOSIS — E1169 Type 2 diabetes mellitus with other specified complication: Secondary | ICD-10-CM | POA: Diagnosis not present

## 2019-03-25 DIAGNOSIS — I4891 Unspecified atrial fibrillation: Secondary | ICD-10-CM | POA: Diagnosis not present

## 2019-03-25 DIAGNOSIS — I6930 Unspecified sequelae of cerebral infarction: Secondary | ICD-10-CM | POA: Diagnosis not present

## 2019-03-25 DIAGNOSIS — I10 Essential (primary) hypertension: Secondary | ICD-10-CM | POA: Diagnosis not present

## 2019-03-28 ENCOUNTER — Ambulatory Visit: Payer: Medicare Other | Admitting: Physical Therapy

## 2019-03-28 ENCOUNTER — Encounter: Payer: Self-pay | Admitting: Physical Therapy

## 2019-03-28 ENCOUNTER — Other Ambulatory Visit: Payer: Self-pay

## 2019-03-28 DIAGNOSIS — M6281 Muscle weakness (generalized): Secondary | ICD-10-CM | POA: Diagnosis not present

## 2019-03-28 DIAGNOSIS — Z9181 History of falling: Secondary | ICD-10-CM

## 2019-03-28 DIAGNOSIS — R262 Difficulty in walking, not elsewhere classified: Secondary | ICD-10-CM

## 2019-03-28 NOTE — Therapy (Signed)
[facility] [address] [place], [place], [address] Phone: [phone]   Fax:  [phone]  Physical Therapy Treatment  Patient Details  Name: Donald [name] MRN: [id number] Date of Birth: 11-28-1945 Referring Provider (PT): London Pepper, MD   Encounter Date: 03/28/2019  PT End of Session - 03/28/19 1402    Visit Number  6    Date for PT Re-Evaluation  05/05/19    PT Start Time  [id number]    PT Stop Time  1438    PT Time Calculation (min)  41 min    Activity Tolerance  Patient tolerated treatment well    Behavior During Therapy  [facility] for tasks assessed/performed       Past Medical History:  Diagnosis Date  . Diabetes mellitus without complication ([place])   . Hyperlipemia   . Hypertension   . large right middle cerebral artery infarct, embolic [id number]   a. s/p IV tPA, b. source unknown, c. loop recorder placed  . Snoring 07/03/2015    Past Surgical History:  Procedure Laterality Date  . LOOP RECORDER IMPLANT  06/07/2013   MDT LinQ implanted by Dr [name] for cryptogenic stroke  . LOOP RECORDER IMPLANT N/A 06/07/2013   Procedure: LOOP RECORDER IMPLANT;  Surgeon: [name], MD;  Location: [facility] CATH LAB;  Service: Cardiovascular;  Laterality: N/A;  . TEE WITHOUT CARDIOVERSION N/A 06/07/2013   Procedure: TRANSESOPHAGEAL ECHOCARDIOGRAM (TEE);  Surgeon: [name], MD;  Location: [facility];  Service: Cardiovascular;  Laterality: N/A;    There were no vitals filed for this visit.  Subjective Assessment - 03/28/19 1403    Subjective  Pt has been taking some long walks and doing all of the exercises.  Pt states at some point he would like to work on getting up without something to hold onto because he will be doing more driving and going places on his own.    Pertinent History  06/03/2013 stroke causing Lt sided weakness, Goes by "[name]"    Currently in Pain?  No/denies                        [facility] Adult PT Treatment/Exercise - 03/28/19 0001      Ambulation/Gait   Gait Comments  weaving in and out of cones; stepping over objects - 5 x each      Knee/Hip Exercises: Aerobic   Nustep  seat and UE #13; L3 x 10 min    PT present for status update     Knee/Hip Exercises: Machines for Strengthening   Cybex Leg Press  single leg seat 9 x 30# x 20 reps LtLE; 45#x20 reps RtLE   2 sets as circuit with all other ther ex     Knee/Hip Exercises: Standing   Hip Flexion  Stengthening;Right;Left;Knee straight;2 sets;10 reps    Hip Flexion Limitations  2.5 2 sets as circuit with all other ther ex    Hip Abduction  Stengthening;Right;Left;2 sets;10 reps;Knee straight   2#   Abduction Limitations  2 sets as circuit with all other ther ex      Knee/Hip Exercises: Seated   Long Arc Quad  Strengthening;Both;2 sets;10 reps    Long Arc Quad Weight  --   2.5   Knee/Hip Flexion  20x Lt LE 2.5 lb               PT Short Term Goals - 03/17/19 1316      PT  SHORT TERM GOAL #2   Title  ind with initial HEP    Status  Achieved        PT Long Term Goals - 03/28/19 1445      PT LONG TERM GOAL #1   Title  Pt will report feeling like his gait feels 75% back to normal    Baseline  added about one block to my walk      PT LONG TERM GOAL #2   Title  Pt will be able to walk at least 850 ft during 6MWT due to improved cadence back to his baseline.    Baseline  working on gait    Status  On-going            Plan - 03/28/19 1446    Clinical Impression Statement  Pt added more gait exercises today and demonstrated some increased speed with foot work throughout the session.  Pt was able to do leg press with single leg.  He needed some rest breaks throughout and alternated sitting and standing exercises.  Pt continues to need cues to keep Lt hip more internally rotated - tactile cues when on Leg press.  Pt will continue to benefit from skilled PT to  improve gait and LE strength for reduced risk of falls.    PT Treatment/Interventions  ADLs/Self Care Home Management;Biofeedback;Cryotherapy;Electrical Stimulation;Moist Heat;Therapeutic activities;Therapeutic exercise;Gait training;Neuromuscular re-education;Patient/family education;Manual techniques;Passive range of motion;Dry needling;Taping    PT Next Visit Plan  work on getting up from the floor, nustep and walking for endurance, LE strength, posture, sit to stand, continue floor ladder and obstacles    PT Home Exercise Plan  Access Code: [email or web address]    Consulted and Agree with Plan of Care  Patient       Patient will benefit from skilled therapeutic intervention in order to improve the following deficits and impairments:  Abnormal gait, Decreased coordination, Postural dysfunction, Decreased strength, Increased fascial restricitons, Increased muscle spasms  Visit Diagnosis: Difficulty in walking, not elsewhere classified  Muscle weakness (generalized)  History of falling     Problem List Patient Active Problem List   Diagnosis Date Noted  . Snoring 07/03/2015  . Persistent atrial fibrillation ([place]) 04/16/2015  . Hypokalemia 09/20/2014  . HTN (hypertension) 09/19/2014  . HLD (hyperlipidemia) 09/19/2014  . Diabetes mellitus without complication ([place]) [id number]  . Jerking 09/19/2014  . Alterations of sensations, late effect of cerebrovascular disease 11/07/2013  . Disturbances of vision, late effect of cerebrovascular disease 11/07/2013  . Sinus bradycardia 08/08/2013  . 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[place]) 07/26/2013  . Left-sided neglect 07/26/2013  . Small vessel disease ([place]) 06/07/2013  . Obesity, unspecified 06/07/2013  . CVA (cerebral infarction) 06/07/2013  . large right middle cerebral artery infarct, embolic [id number]  . Hypertension 06/03/2013  . Diabetes ([place]) 06/03/2013    Jule Ser, PT 03/28/2019, 3:32 PM  [facility] [address], [place], [place], [address] Phone: [phone]   Fax:  [phone]  Name: Donavin Fortune MRN: [id number] Date of Birth: 28-Jun-1945

## 2019-03-31 ENCOUNTER — Encounter: Payer: Self-pay | Admitting: Physical Therapy

## 2019-03-31 ENCOUNTER — Ambulatory Visit: Payer: Medicare Other | Admitting: Physical Therapy

## 2019-03-31 ENCOUNTER — Other Ambulatory Visit: Payer: Self-pay

## 2019-03-31 DIAGNOSIS — M6281 Muscle weakness (generalized): Secondary | ICD-10-CM

## 2019-03-31 DIAGNOSIS — R262 Difficulty in walking, not elsewhere classified: Secondary | ICD-10-CM | POA: Diagnosis not present

## 2019-03-31 DIAGNOSIS — Z9181 History of falling: Secondary | ICD-10-CM | POA: Diagnosis not present

## 2019-03-31 NOTE — Therapy (Signed)
[facility] [address] [place], [place], [address] Phone: [phone]   Fax:  [phone]  Physical Therapy Treatment  Patient Details  Name: Donald [name] MRN: [id number] Date of Birth: September 16, 1945 Referring Provider (PT): London Pepper, MD   Encounter Date: 03/31/2019  PT End of Session - 03/31/19 1357    Visit Number  7    Date for PT Re-Evaluation  05/05/19    PT Start Time  9417    PT Stop Time  1440    PT Time Calculation (min)  45 min    Activity Tolerance  Patient tolerated treatment well    Behavior During Therapy  [facility] for tasks assessed/performed       Past Medical History:  Diagnosis Date  . Diabetes mellitus without complication ([place])   . Hyperlipemia   . Hypertension   . large right middle cerebral artery infarct, embolic 4/0/8144   a. s/p IV tPA, b. source unknown, c. loop recorder placed  . Snoring 07/03/2015    Past Surgical History:  Procedure Laterality Date  . LOOP RECORDER IMPLANT  06/07/2013   MDT LinQ implanted by Dr [name] for cryptogenic stroke  . LOOP RECORDER IMPLANT N/A 06/07/2013   Procedure: LOOP RECORDER IMPLANT;  Surgeon: [name], MD;  Location: [place] CATH LAB;  Service: Cardiovascular;  Laterality: N/A;  . TEE WITHOUT CARDIOVERSION N/A 06/07/2013   Procedure: TRANSESOPHAGEAL ECHOCARDIOGRAM (TEE);  Surgeon: [name], MD;  Location: [place];  Service: Cardiovascular;  Laterality: N/A;    There were no vitals filed for this visit.  Subjective Assessment - 03/31/19 1358    Subjective  Pt reports feeling a big difference in strength over the last few weeks when going places and working in the yard.  My left side isn't getting as tired as quickly.  I am doing my HEP every day.    Pertinent History  06/03/2013 stroke causing Lt sided weakness, Goes by "Donald [name]"    Limitations  Walking    How long can you walk comfortably?  30-45 min (just in the last week) - my gait  is a struggle where it used to be steady    Currently in Pain?  No/denies                       [facility] Adult PT Treatment/Exercise - 03/31/19 0001      Transfers   Five time sit to stand comments   20 sec 5x, able to do 7 in a row before fatigue      Ambulation/Gait   Gait Comments  weaving in and out of cones; stepping over objects - 5 x each      Self-Care   Self-Care  Other Self-Care Comments    Other Self-Care Comments   rise from floor starting in quadruped next to low table, Pt needed PT assistance and UE support on table at this time      Neuro Re-ed    Neuro Re-ed Details   clock drill stepping strategy with single UE support bil      Exercises   Exercises  Lumbar;Knee/Hip      Lumbar Exercises: Aerobic   Nustep  L3 seat/arms 13 x 10', PT present to discuss progress      Knee/Hip Exercises: Seated   Long Arc Quad  Strengthening;Right;Left;2 sets;15 reps    Long Arc Quad Limitations  5# Rt LE, 2.5# Lt LE, seated on black pad  Marching  Strengthening;Both;20 reps;2 sets    Marching Limitations  from black pad, 2.5# Lt LE, 5# Rt LE               PT Short Term Goals - 03/17/19 1316      PT SHORT TERM GOAL #2   Title  ind with initial HEP    Status  Achieved        PT Long Term Goals - 03/31/19 1401      PT LONG TERM GOAL #1   Title  Pt will report feeling like his gait feels 75% back to normal    Baseline  added about one block to my walk, walking about 1 hour most days of the week    Status  On-going      PT LONG TERM GOAL #2   Title  Pt will be able to walk at least 850 ft during 6MWT due to improved cadence back to his baseline.    Baseline  working on gait    Status  On-going      PT LONG TERM GOAL #3   Title  Pt will perform 5 x sit to stand in < 24 seconds due to improved strength and reduced risk of falls    Baseline  20 sec 03/31/19    Status  Achieved            Plan - 03/31/19 1414    Clinical Impression  Statement  Pt met 5x sit to stand goal today improving from 34 sec to 20 today.  He is able to perform 7 reps of sit to stand without UE assist before fatigue.  PT requested advancement of resistance for Rt LE ther ex since he relies on it more due to Lt sided hemiparesis.  PT advanced seated ther ex for Rt LE to 5# and differentiated single leg effort on leg press as well to meet strength of each leg.  Pt was challenged with cone weaving, clock drill with UE support and step over obstacles with close supervision and intermittent CGA for occassional balance loss.  Pt attempted quadruped rising from floor without use of external support but was unable to do this at this time.  Continue along current POC with progression of dynamic balance/gait and LE strength.    Personal Factors and Comorbidities  Age;Comorbidity 1    Comorbidities  stoke    Rehab Potential  Excellent    PT Frequency  2x / week    PT Treatment/Interventions  ADLs/Self Care Home Management;Biofeedback;Cryotherapy;Electrical Stimulation;Moist Heat;Therapeutic activities;Therapeutic exercise;Gait training;Neuromuscular re-education;Patient/family education;Manual techniques;Passive range of motion;Dry needling;Taping    PT Next Visit Plan  Advance Rt LE on leg press to +45# per Pt request next visit, Pt unable to get up from floor from quadruped without use of UE table and PT assist, continue gait dymanics/balance, LE strength, endurance with gait/NuStep    PT Home Exercise Plan  Access Code: [email or web address]    Consulted and Agree with Plan of Care  Patient       Patient will benefit from skilled therapeutic intervention in order to improve the following deficits and impairments:     Visit Diagnosis: Difficulty in walking, not elsewhere classified  Muscle weakness (generalized)  History of falling     Problem List Patient Active Problem List   Diagnosis Date Noted  . Snoring 07/03/2015  . Persistent atrial fibrillation ([place])  04/16/2015  . Hypokalemia 09/20/2014  . HTN (hypertension) 09/19/2014  . HLD (hyperlipidemia) 09/19/2014  .  Diabetes mellitus without complication ([place]) 28/63/8177  . Jerking 09/19/2014  . Alterations of sensations, late effect of cerebrovascular disease 11/07/2013  . Disturbances of vision, late effect of cerebrovascular disease 11/07/2013  . Sinus bradycardia 08/08/2013  . 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[place]) 07/26/2013  . Left-sided neglect 07/26/2013  . Small vessel disease ([place]) 06/07/2013  . Obesity, unspecified 06/07/2013  . CVA (cerebral infarction) 06/07/2013  . large right middle cerebral artery infarct, embolic 11/65/7903  . Hypertension 06/03/2013  . Diabetes ([place]) 06/03/2013    Donald [name], PT 03/31/19 2:42 PM   [facility] [address] [place], [place], [address] Phone: [phone]   Fax:  [phone]  Name: Donald [name] MRN: [id number] Date of Birth: 22-Dec-1945

## 2019-04-07 ENCOUNTER — Ambulatory Visit: Payer: Medicare Other | Attending: Family Medicine | Admitting: Physical Therapy

## 2019-04-07 ENCOUNTER — Encounter: Payer: Self-pay | Admitting: Physical Therapy

## 2019-04-07 ENCOUNTER — Other Ambulatory Visit: Payer: Self-pay

## 2019-04-07 DIAGNOSIS — R262 Difficulty in walking, not elsewhere classified: Secondary | ICD-10-CM | POA: Diagnosis not present

## 2019-04-07 DIAGNOSIS — Z9181 History of falling: Secondary | ICD-10-CM

## 2019-04-07 DIAGNOSIS — M6281 Muscle weakness (generalized): Secondary | ICD-10-CM

## 2019-04-07 NOTE — Therapy (Signed)
[facility] [address] [place], [place], [address] Phone: [phone]   Fax:  [phone]  Physical Therapy Treatment  Patient Details  Name: Donald [name] MRN: [id number] Date of Birth: Jun 28, 1945 Referring Provider (PT): London Pepper, MD   Encounter Date: 04/07/2019  PT End of Session - 04/07/19 1526    Visit Number  8    Date for PT Re-Evaluation  05/05/19    PT Start Time  [id number]    PT Stop Time  1527    PT Time Calculation (min)  42 min    Activity Tolerance  Patient tolerated treatment well    Behavior During Therapy  [facility] for tasks assessed/performed       Past Medical History:  Diagnosis Date  . Diabetes mellitus without complication ([place])   . Hyperlipemia   . Hypertension   . large right middle cerebral artery infarct, embolic [id number]   a. s/p IV tPA, b. source unknown, c. loop recorder placed  . Snoring 07/03/2015    Past Surgical History:  Procedure Laterality Date  . LOOP RECORDER IMPLANT  06/07/2013   MDT LinQ implanted by Dr [name] for cryptogenic stroke  . LOOP RECORDER IMPLANT N/A 06/07/2013   Procedure: LOOP RECORDER IMPLANT;  Surgeon: [name], MD;  Location: [place] CATH LAB;  Service: Cardiovascular;  Laterality: N/A;  . TEE WITHOUT CARDIOVERSION N/A 06/07/2013   Procedure: TRANSESOPHAGEAL ECHOCARDIOGRAM (TEE);  Surgeon: [name], MD;  Location: [place];  Service: Cardiovascular;  Laterality: N/A;    There were no vitals filed for this visit.  Subjective Assessment - 04/07/19 1537    Subjective  I over did it one day and had to take a rest.  I am walking a block farther on my walks in the same amount of time.    Currently in Pain?  No/denies                       [facility] Adult PT Treatment/Exercise - 04/07/19 0001      Therapeutic Activites    Therapeutic Activities  ADL's    ADL's  getting up from floor and sit to stand transfers wihtout assistance       Lumbar Exercises: Aerobic   Nustep  L3 seat/arms 13 x 10', PT present to discuss progress      Lumbar Exercises: Standing   Forward Lunge Limitations  stationary lunges, cues to take big step 2x8 reps; 1 rep slowly lowering all the way to black pad on the floor - Rt LE in front - needed bilat UE support to stand    Shoulder Extension  Strengthening;Both;20 reps;Theraband    Theraband Level (Shoulder Extension)  Level 3 (Green)    Other Standing Lumbar Exercises  tricep extension -       Lumbar Exercises: Seated   Sit to Stand  10 reps      Knee/Hip Exercises: Standing   Hip Flexion  Stengthening;Right;Left;Knee straight;2 sets;10 reps   3lb Lt; 5lb Rt     Knee/Hip Exercises: Seated   Long Arc Quad  Strengthening;Right;Left;2 sets;15 reps    Long Arc Quad Limitations  5# Rt LE, 3# Lt LE, seated on black pad    Marching  Strengthening;Both;20 reps;2 sets    Marching Limitations  from black pad, 3# Lt LE, 5# Rt LE    Sit to Sand  10 reps;without UE support   then 7.5 reps to fatigue  PT Short Term Goals - 03/17/19 1316      PT SHORT TERM GOAL #2   Title  ind with initial HEP    Status  Achieved        PT Long Term Goals - 03/31/19 1401      PT LONG TERM GOAL #1   Title  Pt will report feeling like his gait feels 75% back to normal    Baseline  added about one block to my walk, walking about 1 hour most days of the week    Status  On-going      PT LONG TERM GOAL #2   Title  Pt will be able to walk at least 850 ft during 6MWT due to improved cadence back to his baseline.    Baseline  working on gait    Status  On-going      PT LONG TERM GOAL #3   Title  Pt will perform 5 x sit to stand in < 24 seconds due to improved strength and reduced risk of falls    Baseline  20 sec 03/31/19    Status  Achieved            Plan - 04/07/19 1528    Clinical Impression Statement  Pt did well with minimal rest breaks today.  He was able to progress the  strengthening and worked on lunges for strengthening needed for standing up from the floor.  Pt was able to do 10 sit to stands without UE support and then a second set of 7 reps.  Pt demonstates overall improvement in functional movements and increased strength.  He will benefit from skilled PT.    PT Treatment/Interventions  ADLs/Self Care Home Management;Biofeedback;Cryotherapy;Electrical Stimulation;Moist Heat;Therapeutic activities;Therapeutic exercise;Gait training;Neuromuscular re-education;Patient/family education;Manual techniques;Passive range of motion;Dry needling;Taping    PT Next Visit Plan  continue to work on lunges with slow lowering to half kneel as able, Advance Rt LE on leg press to +45# per Pt request next visit, Pt unable to get up from floor from quadruped without use of UE table and PT assist, continue gait dymanics/balance, LE strength, endurance with gait/NuStep    PT Home Exercise Plan  Access Code: [facility]    Recommended Other Services  initial cert signed    Consulted and Agree with Plan of Care  Patient       Patient will benefit from skilled therapeutic intervention in order to improve the following deficits and impairments:  Abnormal gait, Decreased coordination, Postural dysfunction, Decreased strength, Increased fascial restricitons, Increased muscle spasms  Visit Diagnosis: Difficulty in walking, not elsewhere classified  Muscle weakness (generalized)  History of falling     Problem List Patient Active Problem List   Diagnosis Date Noted  . Snoring 07/03/2015  . Persistent atrial fibrillation ([place]) 04/16/2015  . Hypokalemia 09/20/2014  . HTN (hypertension) 09/19/2014  . HLD (hyperlipidemia) 09/19/2014  . Diabetes mellitus without complication ([place]) [id number]  . Jerking 09/19/2014  . Alterations of sensations, late effect of cerebrovascular disease 11/07/2013  . Disturbances of vision, late effect of cerebrovascular disease 11/07/2013  . Sinus  bradycardia 08/08/2013  . 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[place]) 07/26/2013  . Left-sided neglect 07/26/2013  . Small vessel disease ([place]) 06/07/2013  . Obesity, unspecified 06/07/2013  . CVA (cerebral infarction) 06/07/2013  . large right middle cerebral artery infarct, embolic [id number]  . Hypertension 06/03/2013  . Diabetes ([place]) 06/03/2013    Jule Ser, PT 04/07/2019, 3:50 PM   [facility] [address] [place], [place], [address] Phone: [phone]   Fax:  [phone]  Name: Donald [name] MRN: [id number] Date of Birth: [birth year]

## 2019-04-11 ENCOUNTER — Ambulatory Visit: Payer: Medicare Other | Admitting: Physical Therapy

## 2019-04-11 ENCOUNTER — Other Ambulatory Visit: Payer: Self-pay

## 2019-04-11 DIAGNOSIS — M6281 Muscle weakness (generalized): Secondary | ICD-10-CM

## 2019-04-11 DIAGNOSIS — R262 Difficulty in walking, not elsewhere classified: Secondary | ICD-10-CM

## 2019-04-11 DIAGNOSIS — E1169 Type 2 diabetes mellitus with other specified complication: Secondary | ICD-10-CM | POA: Diagnosis not present

## 2019-04-11 DIAGNOSIS — Z9181 History of falling: Secondary | ICD-10-CM | POA: Diagnosis not present

## 2019-04-11 NOTE — Therapy (Signed)
[facility] [address] [place], [place], [address] Phone: [phone]   Fax:  [phone]  Physical Therapy Treatment  Patient Details  Name: Donald [name] MRN: [id number] Date of Birth: 09-May-1946 Referring Provider (PT): London Pepper, MD   Encounter Date: 04/11/2019  PT End of Session - 04/11/19 1402    Visit Number  9    Date for PT Re-Evaluation  05/05/19    PT Start Time  [id number]    PT Stop Time  1437    PT Time Calculation (min)  40 min    Activity Tolerance  Patient tolerated treatment well    Behavior During Therapy  [facility] for tasks assessed/performed       Past Medical History:  Diagnosis Date  . Diabetes mellitus without complication ([place])   . Hyperlipemia   . Hypertension   . large right middle cerebral artery infarct, embolic [id number]   a. s/p IV tPA, b. source unknown, c. loop recorder placed  . Snoring 07/03/2015    Past Surgical History:  Procedure Laterality Date  . LOOP RECORDER IMPLANT  06/07/2013   MDT LinQ implanted by Dr [name] for cryptogenic stroke  . LOOP RECORDER IMPLANT N/A 06/07/2013   Procedure: LOOP RECORDER IMPLANT;  Surgeon: [name], MD;  Location: [place] CATH LAB;  Service: Cardiovascular;  Laterality: N/A;  . TEE WITHOUT CARDIOVERSION N/A 06/07/2013   Procedure: TRANSESOPHAGEAL ECHOCARDIOGRAM (TEE);  Surgeon: [name], MD;  Location: [place];  Service: Cardiovascular;  Laterality: N/A;    There were no vitals filed for this visit.  Subjective Assessment - 04/11/19 1444    Subjective  I am so-so today.  I am having an off day, maybe pushed it too much.    Currently in Pain?  No/denies                       [facility] Adult PT Treatment/Exercise - 04/11/19 0001      Ambulation/Gait   Gait Comments  walking around, over, and on - green foam pads and half roll - 6x each object      Lumbar Exercises: Aerobic   Nustep  L3 seat/arms 13 x 10', PT  present to discuss progress      Lumbar Exercises: Seated   Sit to Stand  --   16 reps - sitting on foam mat on mat table     Knee/Hip Exercises: Standing   Knee Flexion  Strengthening;Both;2 sets;10 reps   3lb Lt; 5lb Rt   Hip Flexion  Stengthening;Right;Left;Knee straight;2 sets;10 reps    Hip Abduction  Stengthening;Right;Left;2 sets;10 reps;Knee straight   3lb Lt; 5lb Rt     Knee/Hip Exercises: Seated   Long Arc Quad  Strengthening;Right;Left;2 sets;15 reps    Long Arc Quad Limitations  5# Rt LE, 3# Lt LE, seated on black pad    Clamshell with [employer]raBand  Blue   2x15   Marching  Strengthening;Both;20 reps;2 sets    Marching Limitations  from black pad, 3# Lt LE, 5# Rt LE               PT Short Term Goals - 03/17/19 1316      PT SHORT TERM GOAL #2   Title  ind with initial HEP    Status  Achieved        PT Long Term Goals - 03/31/19 1401      PT LONG TERM GOAL #1   Title  Pt will report feeling like his gait feels 75% back to normal    Baseline  added about one block to my walk, walking about 1 hour most days of the week    Status  On-going      PT LONG TERM GOAL #2   Title  Pt will be able to walk at least 850 ft during 6MWT due to improved cadence back to his baseline.    Baseline  working on gait    Status  On-going      PT LONG TERM GOAL #3   Title  Pt will perform 5 x sit to stand in < 24 seconds due to improved strength and reduced risk of falls    Baseline  20 sec 03/31/19    Status  Achieved            Plan - 04/11/19 1445    Clinical Impression Statement  Pt was a little more fatigued today.  He did well doing some seated exercises to break up having to stand due to quad muscle fatigue.  Pt will continue to benefit from skilled PT to work on strength and improved gait.    PT Treatment/Interventions  ADLs/Self Care Home Management;Biofeedback;Cryotherapy;Electrical Stimulation;Moist Heat;Therapeutic activities;Therapeutic exercise;Gait  training;Neuromuscular re-education;Patient/family education;Manual techniques;Passive range of motion;Dry needling;Taping    PT Next Visit Plan  continue to work on lunges with slow lowering to half kneel as able, Advance Rt LE on leg press to +45# per Pt request next visit, Pt unable to get up from floor from quadruped without use of UE table and PT assist, continue gait dymanics/balance, LE strength, endurance with gait/NuStep    PT Home Exercise Plan  Access Code: [email or web address]    Consulted and Agree with Plan of Care  Patient       Patient will benefit from skilled therapeutic intervention in order to improve the following deficits and impairments:  Abnormal gait, Decreased coordination, Postural dysfunction, Decreased strength, Increased fascial restricitons, Increased muscle spasms  Visit Diagnosis: Difficulty in walking, not elsewhere classified  Muscle weakness (generalized)  History of falling     Problem List Patient Active Problem List   Diagnosis Date Noted  . Snoring 07/03/2015  . Persistent atrial fibrillation ([place]) 04/16/2015  . Hypokalemia 09/20/2014  . HTN (hypertension) 09/19/2014  . HLD (hyperlipidemia) 09/19/2014  . Diabetes mellitus without complication ([place]) [id number]  . Jerking 09/19/2014  . Alterations of sensations, late effect of cerebrovascular disease 11/07/2013  . Disturbances of vision, late effect of cerebrovascular disease 11/07/2013  . Sinus bradycardia 08/08/2013  . 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[place]) 07/26/2013  . Left-sided neglect 07/26/2013  . Small vessel disease ([place]) 06/07/2013  . Obesity, unspecified 06/07/2013  . CVA (cerebral infarction) 06/07/2013  . large right middle cerebral artery infarct, embolic [id number]  . Hypertension 06/03/2013  . Diabetes ([place]) 06/03/2013    Jule Ser, PT 04/11/2019, 2:47 PM  [facility] [address] [place], [place], [address] Phone: [phone]   Fax:  [phone]  Name: Donald [name] MRN: [id number] Date of Birth: [birth year]

## 2019-04-14 ENCOUNTER — Other Ambulatory Visit: Payer: Self-pay

## 2019-04-14 ENCOUNTER — Ambulatory Visit: Payer: Medicare Other | Admitting: Physical Therapy

## 2019-04-14 ENCOUNTER — Encounter: Payer: Self-pay | Admitting: Physical Therapy

## 2019-04-14 DIAGNOSIS — M6281 Muscle weakness (generalized): Secondary | ICD-10-CM | POA: Diagnosis not present

## 2019-04-14 DIAGNOSIS — Z9181 History of falling: Secondary | ICD-10-CM | POA: Diagnosis not present

## 2019-04-14 DIAGNOSIS — R262 Difficulty in walking, not elsewhere classified: Secondary | ICD-10-CM

## 2019-04-14 NOTE — Therapy (Signed)
[facility] [address], [place], [place], [address] Phone: [phone]   Fax:  [phone]  Physical Therapy Treatment Progress Note Reporting Period 03/10/19 to 04/14/19   See note below for Objective Data and Assessment of Progress/Goals.      Patient Details  Name: Donald [name] MRN: [id number] Date of Birth: Nov 08, 1945 Referring Provider (PT): London Pepper, MD   Encounter Date: 04/14/2019  PT End of Session - 04/14/19 1527    Visit Number  10    Number of Visits  20    Date for PT Re-Evaluation  05/05/19    PT Start Time  1525    PT Stop Time  1612    PT Time Calculation (min)  47 min    Activity Tolerance  Patient tolerated treatment well    Behavior During Therapy  [facility] for tasks assessed/performed       Past Medical History:  Diagnosis Date  . Diabetes mellitus without complication ([place])   . Hyperlipemia   . Hypertension   . large right middle cerebral artery infarct, embolic 10/01/7614   a. s/p IV tPA, b. source unknown, c. loop recorder placed  . Snoring 07/03/2015    Past Surgical History:  Procedure Laterality Date  . LOOP RECORDER IMPLANT  06/07/2013   MDT LinQ implanted by Dr [name] for cryptogenic stroke  . LOOP RECORDER IMPLANT N/A 06/07/2013   Procedure: LOOP RECORDER IMPLANT;  Surgeon: [name], MD;  Location: [place] CATH LAB;  Service: Cardiovascular;  Laterality: N/A;  . TEE WITHOUT CARDIOVERSION N/A 06/07/2013   Procedure: TRANSESOPHAGEAL ECHOCARDIOGRAM (TEE);  Surgeon: [name], MD;  Location: [place];  Service: Cardiovascular;  Laterality: N/A;    There were no vitals filed for this visit.  Subjective Assessment - 04/14/19 1639    Subjective  Pt states he is doing well today    Currently in Pain?  No/denies    Multiple Pain Sites  No         [facility] PT Assessment - 04/14/19 0001      6 minute walk test results    Aerobic Endurance Distance [id number]  626    Endurance additional comments  able to maintain steady pace with slight increase towards the final 2 minutes      Standardized Balance Assessment   Five times sit to stand comments   16   first attempt 14 seconds with only half stand on 5th rep                  [facility] Adult PT Treatment/Exercise - 04/14/19 0001      Neuro Re-ed    Neuro Re-ed Details   alternating foot tapping with speed x 50 sec; alternating toe tapping with speed x 45 sec x 2 reps      Lumbar Exercises: Aerobic   Nustep  L3 seat/arms 13 x 10', PT present to discuss progress      Lumbar Exercises: Standing   Other Standing Lumbar Exercises  wall push up - cues to lower Lt shoulder and arm - engage core - 15x      Knee/Hip Exercises: Seated   Long Arc Quad  Strengthening;Right;Left;2 sets;15 reps    Long Arc Quad Limitations  5# Rt LE, 3# Lt LE, ball squeeze between knees    Clamshell with TheraBand  Blue   2x15 - gave blue band to add to HEP  PT Short Term Goals - 04/14/19 1527      PT SHORT TERM GOAL #1   Title  5 x sit to stand < 30 seconds    Status  Achieved        PT Long Term Goals - 04/14/19 1528      PT LONG TERM GOAL #1   Title  Pt will report feeling like his gait feels 75% back to normal    Baseline  about 70% back to normal    Status  Partially Met      PT LONG TERM GOAL #2   Title  Pt will be able to walk at least 850 ft during 6MWT due to improved cadence back to his baseline.    Baseline  770 ft    Status  Partially Met      PT LONG TERM GOAL #3   Title  Pt will perform 5 x sit to stand in < 14 seconds due to improved strength and reduced risk of falls    Baseline  16 sec on 04/14/19 (initial goal achieved)    Status  Revised      PT LONG TERM GOAL #4   Title  Pt will be ind with HEP to maintain improvements made during time working with skilled PT    Status  On-going            Plan - 04/14/19 1637    Clinical Impression Statement  Pt made  progress on 6 MWT and has achieved 5x sit to stand.  Goal for STS was revised.  Pt continue to show improvements and will continue to benefit from skilled PT to return to his baseline.  Pt feels that he is 70% back to PLOF.    PT Treatment/Interventions  ADLs/Self Care Home Management;Biofeedback;Cryotherapy;Electrical Stimulation;Moist Heat;Therapeutic activities;Therapeutic exercise;Gait training;Neuromuscular re-education;Patient/family education;Manual techniques;Passive range of motion;Dry needling;Taping    PT Next Visit Plan  nustep to start and add resistance for bumping up endurance as well as add 5 min on nustep at the end of session, focus on LE strength, lunges, rapid alternating movements    PT Home Exercise Plan  Access Code: [email or web address]    Consulted and Agree with Plan of Care  Patient       Patient will benefit from skilled therapeutic intervention in order to improve the following deficits and impairments:  Abnormal gait, Decreased coordination, Postural dysfunction, Decreased strength, Increased fascial restricitons, Increased muscle spasms  Visit Diagnosis: Difficulty in walking, not elsewhere classified  Muscle weakness (generalized)  History of falling     Problem List Patient Active Problem List   Diagnosis Date Noted  . Snoring 07/03/2015  . Persistent atrial fibrillation ([place]) 04/16/2015  . Hypokalemia 09/20/2014  . HTN (hypertension) 09/19/2014  . HLD (hyperlipidemia) 09/19/2014  . Diabetes mellitus without complication ([place]) 16/03/9603  . Jerking 09/19/2014  . Alterations of sensations, late effect of cerebrovascular disease 11/07/2013  . Disturbances of vision, late effect of cerebrovascular disease 11/07/2013  . Sinus bradycardia 08/08/2013  . 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[place]) 07/26/2013  . Left-sided neglect 07/26/2013  . Small vessel disease ([place]) 06/07/2013  . Obesity, unspecified 06/07/2013  . CVA (cerebral infarction) 06/07/2013  .  large right middle cerebral artery infarct, embolic 54/01/8118  . Hypertension 06/03/2013  . Diabetes ([place]) 06/03/2013    Jule Ser, PT 04/14/2019, 4:41 PM  [facility] [address] [place], [place], [address] Phone: [phone]   Fax:  [phone]  Name: Dhani Dannemiller MRN: [id number] Date of Birth: 19-Feb-1946

## 2019-04-18 ENCOUNTER — Encounter: Payer: Self-pay | Admitting: Physical Therapy

## 2019-04-18 ENCOUNTER — Ambulatory Visit: Payer: Medicare Other | Admitting: Physical Therapy

## 2019-04-18 ENCOUNTER — Other Ambulatory Visit: Payer: Self-pay

## 2019-04-18 DIAGNOSIS — R262 Difficulty in walking, not elsewhere classified: Secondary | ICD-10-CM | POA: Diagnosis not present

## 2019-04-18 DIAGNOSIS — Z9181 History of falling: Secondary | ICD-10-CM

## 2019-04-18 DIAGNOSIS — M6281 Muscle weakness (generalized): Secondary | ICD-10-CM

## 2019-04-18 NOTE — Therapy (Addendum)
[facility] [address] [place], [place], [address] Phone: [phone]   Fax:  [phone]  Physical Therapy Treatment  Patient Details  Name: Donald [name] MRN: [id number] Date of Birth: July 07, 1945 Referring Provider (PT): London Pepper, MD   Encounter Date: 04/18/2019  PT End of Session - 04/18/19 1449    Visit Number  11    Number of Visits  20    Date for PT Re-Evaluation  05/05/19    PT Start Time  7591    PT Stop Time  1528    PT Time Calculation (min)  42 min    Activity Tolerance  Patient tolerated treatment well    Behavior During Therapy  [facility] for tasks assessed/performed       Past Medical History:  Diagnosis Date  . Diabetes mellitus without complication ([place])   . Hyperlipemia   . Hypertension   . large right middle cerebral artery infarct, embolic 11/02/8464   a. s/p IV tPA, b. source unknown, c. loop recorder placed  . Snoring 07/03/2015    Past Surgical History:  Procedure Laterality Date  . LOOP RECORDER IMPLANT  06/07/2013   MDT LinQ implanted by Dr [name] for cryptogenic stroke  . LOOP RECORDER IMPLANT N/A 06/07/2013   Procedure: LOOP RECORDER IMPLANT;  Surgeon: [name], MD;  Location: [place] CATH [facility];  Service: Cardiovascular;  Laterality: N/A;  . TEE WITHOUT CARDIOVERSION N/A 06/07/2013   Procedure: TRANSESOPHAGEAL ECHOCARDIOGRAM (TEE);  Surgeon: [name], MD;  Location: [place];  Service: Cardiovascular;  Laterality: N/A;    There were no vitals filed for this visit.  Subjective Assessment - 04/18/19 1450    Subjective  Pt has not gone for a walk yet today. Pt states he has been rushing around today.    Pertinent History  06/03/2013 stroke causing Lt sided weakness, Goes by "[name]"    Currently in Pain?  No/denies    Multiple Pain Sites  No                       [facility] Adult PT Treatment/Exercise - 04/18/19 0001      Lumbar Exercises: Standing    Forward Lunge Limitations  stationary lunges, cues to take big step 2x8 reps; 1 rep slowly lowering all the way to black pad on the floor - Rt LE in front - needed bilat UE support to stand    Other Standing Lumbar Exercises  standing hip abduction - blue band - 20x      Lumbar Exercises: Seated   Sit to Stand  10 reps   no UE   Sit to Stand Limitations  sitting on foam mat on mat table      Knee/Hip Exercises: Machines for Strengthening   Cybex Leg Press  single leg seat 9 x LtLE; 50#x12 reps RtLE 70# x 20 reps   2 sets     Knee/Hip Exercises: Standing   Hip Flexion  Stengthening;Right;Left;Knee straight;2 sets;10 reps   red band   Hip Abduction  Stengthening;Right;Left;2 sets;10 reps   red band     Knee/Hip Exercises: Seated   Long Arc Quad  Strengthening;Right;Left;2 sets;20 reps   one set without ball, one set with ball squeeze   Long Arc Quad Limitations  6# Rt LE, 4# Lt LE    Marching  Strengthening;Both;20 reps;2 sets    Marching Limitations  from black pad, 4# Lt LE, 6# Rt LE  PT Short Term Goals - 04/14/19 1527      PT SHORT TERM GOAL #1   Title  5 x sit to stand < 30 seconds    Status  Achieved        PT Long Term Goals - 04/14/19 1528      PT LONG TERM GOAL #1   Title  Pt will report feeling like his gait feels 75% back to normal    Baseline  about 70% back to normal    Status  Partially Met      PT LONG TERM GOAL #2   Title  Pt will be able to walk at least 850 ft during 6MWT due to improved cadence back to his baseline.    Baseline  770 ft    Status  Partially Met      PT LONG TERM GOAL #3   Title  Pt will perform 5 x sit to stand in < 14 seconds due to improved strength and reduced risk of falls    Baseline  16 sec on 04/14/19 (initial goal achieved)    Status  Revised      PT LONG TERM GOAL #4   Title  Pt will be ind with HEP to maintain improvements made during time working with skilled PT    Status  On-going             Plan - 04/18/19 1713    Clinical Impression Statement  Pt has been able to add resistance today.  He demonstrates improved lunges with abilty to get lower into the lunge position.  Pt did not need cues to keep Lt LE straight on leg press.  Pt continues to benefit from skilled PT and is recommended to continue with POC    PT Treatment/Interventions  ADLs/Self Care Home Management;Biofeedback;Cryotherapy;Electrical Stimulation;Moist Heat;Therapeutic activities;Therapeutic exercise;Gait training;Neuromuscular re-education;Patient/family education;Manual techniques;Passive range of motion;Dry needling;Taping    PT Next Visit Plan  nustep to start and add resistance for bumping up endurance as well as add 5 min on nustep at the end of session, focus on LE strength, lunges, rapid alternating movements    PT Home Exercise Plan  Access Code: [email or web address]    Consulted and Agree with Plan of Care  Patient       Patient will benefit from skilled therapeutic intervention in order to improve the following deficits and impairments:  Abnormal gait, Decreased coordination, Postural dysfunction, Decreased strength, Increased fascial restricitons, Increased muscle spasms  Visit Diagnosis: Difficulty in walking, not elsewhere classified  Muscle weakness (generalized)  History of falling     Problem List Patient Active Problem List   Diagnosis Date Noted  . Snoring 07/03/2015  . Persistent atrial fibrillation ([place]) 04/16/2015  . Hypokalemia 09/20/2014  . HTN (hypertension) 09/19/2014  . HLD (hyperlipidemia) 09/19/2014  . Diabetes mellitus without complication ([place]) 16/03/9603  . Jerking 09/19/2014  . Alterations of sensations, late effect of cerebrovascular disease 11/07/2013  . Disturbances of vision, late effect of cerebrovascular disease 11/07/2013  . Sinus bradycardia 08/08/2013  . 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[place]) 07/26/2013  . Left-sided neglect 07/26/2013  . Small  vessel disease ([place]) 06/07/2013  . Obesity, unspecified 06/07/2013  . CVA (cerebral infarction) 06/07/2013  . large right middle cerebral artery infarct, embolic 54/01/8118  . Hypertension 06/03/2013  . Diabetes ([place]) 06/03/2013    Jule Ser, PT 04/18/2019, 5:18 PM  [facility] [address] [place], [place], [address] Phone: [phone]  Fax:  [phone]  Name: Donald [name] MRN: [id number] Date of Birth: 05-05-1946  PHYSICAL THERAPY DISCHARGE SUMMARY  Visits from Start of Care: 11 Current functional level related to goals / functional outcomes: See above goals   Remaining deficits: See above   Education / Equipment: HEP Plan: Patient agrees to discharge.  Patient goals were partially met. Patient is being discharged due to not returning since the last visit.  ?????    [employer], PT 08/01/19 10:47 AM

## 2019-04-21 ENCOUNTER — Ambulatory Visit: Payer: Medicare Other | Admitting: Physical Therapy

## 2019-04-25 ENCOUNTER — Encounter: Payer: Medicare Other | Admitting: Physical Therapy

## 2019-05-02 ENCOUNTER — Encounter: Payer: Medicare Other | Admitting: Physical Therapy

## 2019-05-05 ENCOUNTER — Encounter: Payer: Medicare Other | Admitting: Physical Therapy

## 2019-05-17 ENCOUNTER — Ambulatory Visit (HOSPITAL_COMMUNITY)
Admission: RE | Admit: 2019-05-17 | Discharge: 2019-05-17 | Disposition: A | Discharge status: Home / Self Care | Payer: Medicare Other | Source: Ambulatory Visit | Attending: Physician Assistant | Admitting: Physician Assistant

## 2019-05-17 ENCOUNTER — Other Ambulatory Visit: Payer: Self-pay

## 2019-05-17 VITALS — BP 130/70 | HR 70 | Ht 75.0 in | Wt 234.4 lb

## 2019-05-17 DIAGNOSIS — E119 Type 2 diabetes mellitus without complications: Secondary | ICD-10-CM | POA: Diagnosis not present

## 2019-05-17 DIAGNOSIS — Z888 Allergy status to other drugs, medicaments and biological substances status: Secondary | ICD-10-CM | POA: Insufficient documentation

## 2019-05-17 DIAGNOSIS — R9431 Abnormal electrocardiogram [ECG] [EKG]: Secondary | ICD-10-CM | POA: Diagnosis not present

## 2019-05-17 DIAGNOSIS — Z833 Family history of diabetes mellitus: Secondary | ICD-10-CM | POA: Diagnosis not present

## 2019-05-17 DIAGNOSIS — D6869 Other thrombophilia: Secondary | ICD-10-CM

## 2019-05-17 DIAGNOSIS — Z79899 Other long term (current) drug therapy: Secondary | ICD-10-CM | POA: Diagnosis not present

## 2019-05-17 DIAGNOSIS — Z8349 Family history of other endocrine, nutritional and metabolic diseases: Secondary | ICD-10-CM | POA: Diagnosis not present

## 2019-05-17 DIAGNOSIS — I10 Essential (primary) hypertension: Secondary | ICD-10-CM | POA: Insufficient documentation

## 2019-05-17 DIAGNOSIS — Z8673 Personal history of transient ischemic attack (TIA), and cerebral infarction without residual deficits: Secondary | ICD-10-CM | POA: Insufficient documentation

## 2019-05-17 DIAGNOSIS — Z87891 Personal history of nicotine dependence: Secondary | ICD-10-CM | POA: Diagnosis not present

## 2019-05-17 DIAGNOSIS — Z7901 Long term (current) use of anticoagulants: Secondary | ICD-10-CM | POA: Insufficient documentation

## 2019-05-17 DIAGNOSIS — I4821 Permanent atrial fibrillation: Secondary | ICD-10-CM | POA: Insufficient documentation

## 2019-05-17 NOTE — Progress Notes (Signed)
Primary Care Physician: London Pepper, MD Referring Physician: Dr. Loney Hering Speciale is a 73 y.o. male with a h/o ILR implantation for stroke and had atrial fibrillation discovered. He has been started on Eliquis for a CHADS2VASC score of 5.  He continues to do well.  His exercise tolerance and energy are good.  He denies palpitations or any symptoms of AF. He previously has had 3 second pauses on ILR for which he has been completely asymptomatic. He is now in permanent afib and his ILR is past EOL. He has elected to not have it removed.  On follow up today, patient reports that he has done well since his last visit. He denies any symptoms from his arrhythmia. He has been active trying to walk daily. He denies any bleeding issues on anticoagulation.    Today, he denies symptoms of palpitations, chest pain, shortness of breath, orthopnea, PND, lower extremity edema, dizziness, presyncope, syncope, or neurologic sequela. The patient is tolerating medications without difficulties and is otherwise without complaint today.   Past Medical History:  Diagnosis Date  . Diabetes mellitus without complication ([place])   . Hyperlipemia   . Hypertension   . large right middle cerebral artery infarct, embolic [id number]   a. s/p IV tPA, b. source unknown, c. loop recorder placed  . Snoring 07/03/2015   Past Surgical History:  Procedure Laterality Date  . LOOP RECORDER IMPLANT  06/07/2013   MDT LinQ implanted by Dr [name] for cryptogenic stroke  . LOOP RECORDER IMPLANT N/A 06/07/2013   Procedure: LOOP RECORDER IMPLANT;  Surgeon: [name], MD;  Location: [place] CATH [facility];  Service: Cardiovascular;  Laterality: N/A;  . TEE WITHOUT CARDIOVERSION N/A 06/07/2013   Procedure: TRANSESOPHAGEAL ECHOCARDIOGRAM (TEE);  Surgeon: [name], MD;  Location: [facility] ENDOS[facility];  Service: Cardiovascular;  Laterality: N/A;    Current Outpatient Medications  Medication Sig Dispense Refill  . amLODipine (NORVASC)  10 MG tablet Take 1 tablet (10 mg total) by mouth daily. 30 tablet 1  . Cholecalciferol (VITAMIN D-3) 1000 units CAPS Take by mouth daily.    . cloNIDine (CATAPRES) 0.3 MG tablet Take 0.3 mg by mouth 2 (two) times daily.    Marland Kitchen co-enzyme Q-10 30 MG capsule Take 30 mg by mouth 3 (three) times daily.    Marland Kitchen ELIQUIS 5 MG TABS tablet TAKE ONE TABLET BY MOUTH TWICE A DAY 180 tablet 1  . hydrALAZINE (APRESOLINE) 10 MG tablet Take 10 mg by mouth daily.     Marland Kitchen levETIRAcetam (KEPPRA XR) 500 MG 24 hr tablet Take 2 tablets (1,000 mg total) by mouth at bedtime. 180 tablet 3  . lisinopril (PRINIVIL,ZESTRIL) 40 MG tablet Take 1 tablet (40 mg total) by mouth daily. 30 tablet 1  . metFORMIN (GLUCOPHAGE-XR) 500 MG 24 hr tablet 500 mg 2 (two) times daily.    . Multiple Vitamin (MULTIVITAMIN) capsule Take 1 capsule by mouth daily.    . potassium chloride SA (K-DUR,KLOR-CON) 20 MEQ tablet Take 20 mEq by mouth 4 (four) times daily.     . vitamin E 400 UNIT capsule Take 400 Units by mouth daily.     No current facility-administered medications for this visit.    Allergies  Allergen Reactions  . Bitolterol   . Bidil [Isosorb Dinitrate-Hydralazine] Other (See Comments)    Lethargic,tired     Social History   Socioeconomic History  . Marital status: Married    Spouse name: [name]  . Number of children: 2  .  Years of education: College  . Highest education level: Not on file  Occupational History  . Occupation: Retired    Fish farm manager: Psychologist, educational  Tobacco Use  . Smoking status: Former Research scientist (life sciences)  . Smokeless tobacco: Never Used  . Tobacco comment: Quit 25 years ago.  Substance and Sexual Activity  . Alcohol use: Yes    Alcohol/week: 1.0 standard drinks    Types: 1 Glasses of wine per week    Comment: Rare  . Drug use: No  . Sexual activity: Never  Other Topics Concern  . Not on file  Social History Narrative   Patient lives at home [name]).   Retired works part time 10 hours a week.   Right  handed   Education college   Caffeine: one cup daily coffee   No soda or tea.   Social Determinants of Health   Financial Resource Strain:   . Difficulty of Paying Living Expenses: Not on file  Food Insecurity:   . Worried About Charity fundraiser in the Last Year: Not on file  . Ran Out of Food in the Last Year: Not on file  Transportation Needs:   . Lack of Transportation (Medical): Not on file  . Lack of Transportation (Non-Medical): Not on file  Physical Activity:   . Days of Exercise per Week: Not on file  . Minutes of Exercise per Session: Not on file  Stress:   . Feeling of Stress : Not on file  Social Connections:   . Frequency of Communication with Friends and Family: Not on file  . Frequency of Social Gatherings with Friends and Family: Not on file  . Attends Religious Services: Not on file  . Active Member of Clubs or Organizations: Not on file  . Attends Archivist Meetings: Not on file  . Marital Status: Not on file  Intimate Partner Violence:   . Fear of Current or Ex-Partner: Not on file  . Emotionally Abused: Not on file  . Physically Abused: Not on file  . Sexually Abused: Not on file    Family History  Problem Relation Age of Onset  . Dementia Mother   . Diabetes Mother   . Heart attack Father     ROS- All systems are reviewed and negative except as per the HPI above  Physical Exam: There were no vitals filed for this visit. Wt Readings from Last 3 Encounters:  07/15/18 230 lb (104.3 kg)  06/16/18 230 lb (104.3 kg)  05/24/18 230 lb 9.6 oz (104.6 kg)    Labs: Lab Results  Component Value Date   NA 137 09/19/2014   K 3.3 (L) 09/19/2014   CL 99 09/19/2014   CO2 20 09/19/2014   GLUCOSE 131 (H) 09/19/2014   BUN 18 09/19/2014   CREATININE 1.00 09/19/2014   CALCIUM 9.1 09/19/2014   Lab Results  Component Value Date   INR 1.06 09/19/2014   Lab Results  Component Value Date   CHOL 139 09/20/2014   HDL 28 (L) 09/20/2014    LDLCALC 68 09/20/2014   TRIG 214 (H) 09/20/2014   GEN- The patient is well appearing, alert and oriented x 3 today.   HEENT-head normocephalic, atraumatic, sclera clear, conjunctiva pink, hearing intact, trachea midline. Lungs- Clear to ausculation bilaterally, normal work of breathing Heart- irregular rate and rhythm, no murmurs, rubs or gallops  GI- soft, NT, ND, + BS Extremities- no clubbing, cyanosis, or edema MS- no significant deformity or atrophy Skin- no rash or  lesion Psych- euthymic mood, full affect Neuro- residual left side weakness   EKG- afib HR 70, PVC, slow R wave prog, QRS 90, QTc 408  Epic records reviewed    Assessment and Plan: 1. Permanent afib Patient continues in rate controlled, asymptomatic afib.  Continue with Eliquis 5 mg BID  This patients CHA2DS2-VASc Score and unadjusted Ischemic Stroke Rate (% per year) is equal to 7.2 % stroke rate/year from a score of 5  Above score calculated as 1 point each if present [CHF, HTN, DM, Vascular=MI/PAD/Aortic Plaque, Age if 65-74, or Male] Above score calculated as 2 points each if present [Age > 75, or Stroke/TIA/TE]  2. HTN Stable, no changes today.   Follow up in the AF clinic as needed.    [facility] [address] [place], [place] [address] [phone]

## 2019-07-12 DIAGNOSIS — N5201 Erectile dysfunction due to arterial insufficiency: Secondary | ICD-10-CM | POA: Diagnosis not present

## 2019-07-25 ENCOUNTER — Ambulatory Visit (INDEPENDENT_AMBULATORY_CARE_PROVIDER_SITE_OTHER): Payer: Medicare Other | Admitting: Neurology

## 2019-07-25 ENCOUNTER — Encounter: Payer: Self-pay | Admitting: Neurology

## 2019-07-25 ENCOUNTER — Other Ambulatory Visit: Payer: Self-pay

## 2019-07-25 VITALS — BP 147/97 | HR 62 | Temp 97.2°F | Ht 75.0 in | Wt 234.7 lb

## 2019-07-25 DIAGNOSIS — R569 Unspecified convulsions: Secondary | ICD-10-CM

## 2019-07-25 DIAGNOSIS — I6990 Unspecified sequelae of unspecified cerebrovascular disease: Secondary | ICD-10-CM

## 2019-07-25 NOTE — Progress Notes (Signed)
PATIENT: Donald [name] DOB: Jul 26, 1945  REASON FOR VISIT: follow up- stroke, seizures HISTORY FROM: patient  HISTORY OF PRESENT ILLNESS: [name] is a 74 year old male with a history of stroke and seizures. He returns today for follow-up. He continues on Eliquis and is tolerating it well. His blood pressure is slightly elevated today. His primary care is managing his hypertension, hyperlipidemia and diabetes. He reports overall he is doing well. He uses a cane when ambulating. Went to PT had good benefit. Using AFO on brace on the left.Denies any falls. He continues on Keppra XR1000 mg at bedtime. He denies any seizure events. He operates a Teacher, music without difficulty. Patient is asking about driving. He returns today for an evaluation.  HISTORY 07/09/15:[name]  Is a 74 year old male with a history of stroke and seizures. He returns today for follow-up. The patient was switched to Eliquis by his cardiologist after he was found to have atrial fibrillation in October. He is tolerating this medication well. The patient's blood pressure is elevated slightly today however this is consistent with every time he comes in to the doctor's office. He states that he has whitecoat syndrome. His primary care is managing his cholesterol  And diabetes. He denies any additional strokelike symptoms. He does not have any weakness from the stroke but does feel like his reaction time on the left side is slower. He uses a cane when  Ambulating.  The patient states that he has a hard time with  ambulatingup or down steps. He is wondering if physical therapy would be beneficial. He continues to use Keppra XR thousand milligrams at bedtime. He is not had any additional seizure events. He denies any new neurological symptoms. He returns today for an evaluation.  HISTORY 01/08/15: [name] is a 74 year old male with a history of ischemic stroke and seizures. He returns today for follow-up. The  patient continues to take Plavix for stroke prevention. The patient's primary care manages his blood pressure, cholesterol and diabetes. Patient states that his blood pressure is slightly elevated today. However he relates this to "white coat syndrome." Patient states that he had a seizure in April at that time he was placed on Keppra XR 1000 mg at bedtime. He states that since then he has not had any seizures. He does not operate a motor vehicle. He is able to complete all ADLs independently. Patient states that after the stroke he has had some numbness in the bottom of the left foot and that affects his balance. He continues to use a cane when ambulating. Denies any recent falls. Denies any new neurological symptoms. He returns today for an evaluation.  HISTORY 07/05/14:[name] is a 74 year old male with a history of stroke on 06/03/13. He returns today for follow-up. He is currently taking Plavix for stroke prevention. His HTN, cholesterol and Diabetes is managed by his PCP. He states that his BP has been controlled. When he checks it at home it is usually 130s/70s. His states that his last hemoglobin A1C was 5.9 % . He continues to take metformin- his dosage has recently been decreased. His cholesterol has been in normal range according to the patient and therefore medication has not been required. Patient will use a cane when he is out in public. He does not use it at home. He feels that his walking has improved. Denies any falls. He notes that his fine motor skills in the left hand has improved. He states that he is  now able to cook at home. He does have some sensory changes in the left hand and foot. Denies any new symptoms.   HISTORY 12/20/13 (Mashonda Broski): is an 74 y.o. male who was visiting here for the holidays on 06/03/2013. When he got up he was normal. As he was returning to bed began to be short of breath. Patient then was noted to have slurred speech by his wife and she noted a facial droop.  Patient seemed to be weak on the left a well. EMS was called and the patient was brought in as a code stroke. Initial NIHSS of 8. Patient was given TPA. No intervenable lesion was seen on CTA. He was admitted to the neuro ICU for further evaluation and treatment. CT angio head showed Subtle attenuation of the distal right superior MCA territory branch vessels as compared to the left. No proximal branch occlusion or high-grade flow-limiting stenosis identified within the right middle cerebral artery proximally. Otherwise unremarkable CTA of the head without evidence of occlusion, high-grade stenosis, or aneurysm elsewhere within the brain.  CT angio neck was a normal CTA of the neck without evidence of high-grade stenosis, occlusion, or dissection. 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 MRI of the brain showed an acute large right middle cerebral artery territory infarct. Minimal underlying petechial hemorrhage without lobar hematoma. 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 Minimal additional white matter changes suggest chronic small vessel ischemic disease. Acute on chronic paranasal sinusitis. 2D Echocardiogram with the estimated ejection fraction was in the range of 60% to 65%. He was admitted to [facility] 06/07/2013 through 07/02/2013. He is completed home physical therapy and occupational therapy and is about to start going to outpatient neuro rehabilitation. He is making good progress with his rehabilitation and is very determined. He and his wife have moved in with her daughter here in [place]. He has established care with Dr. Orland Mustard and is seeing him every 3 weeks for adjustments to his hypertensive medications, as his hypertension has been very difficult to control since his 41s. He thinks he forgot to take his blood pressure medicine the day he had the stroke. His blood pressure is elevated office today at 161/90, he is  tolerating Plavix well without any significant bruising or bleeding. He is able to ambulate short distances with a hemiwalker and AFO brace on the left. He is having some spasticity in the left arm with clonus at times.    Update 07/14/2016 : He returns for follow-up after last visit 6 months ago. He is a complaint by his wife. He states is doing well without recurrent stroke or TIA symptoms. He remains on eliquis which is tolerating well without bleeding or bruising. He states his blood pressure is usually well controlled at home in the 7:84 systolic range but does tend to go up when he visits physicians. Today it is 151/90 in office. He states his diabetes is well controlled and last him about an A1c was 5.9. He does not check his fasting sugars. He is tolerating Keppra XR 1000 mg daily well without side effects and states he is not had any seizures for more than 2 years. He still has some difficulty walking but wears an AFO and walks with stiffness of his left leg. He is a height but no falls or injuries. He has not had any follow-up carotid ultrasound done for more than 2 years. Updated 07/15/2018 : He returns for follow-up after last visit 2  years ago.  Continues to do well and has not had recurrent stroke or TIA symptoms since 2015.  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  He walks with a cane with stiffness of his left leg.  He has had no falls or injuries.  He has had no recurrent stroke or TIA symptoms.  He remains on Eliquis which is tolerating well with only minor bruising.  He states his fasting sugars have all been good and last hemoglobin A1c was 6.8.  His blood pressure is well controlled though it is slightly elevated in office today at 144/82.  He has not had any seizures since 2016 and remains on Keppra XR 1 gm at night and is tolerating it well without any side effects.  He is considering having penile implant surgery and has already met with cardiologist Dr.  [name] and got cardiac clearance.  He underwent medical stress test which went fine.  He has no new complaints today. Update 07/25/2019 : He returns for follow-up after last visit a year ago.  Continues to do well and has not had any recurrent stroke or TIA symptoms since his original stroke in 2015.  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  He remains on Eliquis which is tolerating well without bruising or bleeding.  Is also tolerating Lipitor 10 mg well without muscle aches or pains.  His blood pressure is usually in the 130s at home though today it is slightly elevated in office at 147/97.  He did have a follow-up carotid ultrasound done after last visit on 07/26/2018 which showed no significant extracranial carotid stenosis on either side.  Patient states he may be under stress recently since his 15 year old mother-in-law has moved into live with him and his wife for the last 1 year.  He feels at times he is having some tics around his lips but he thinks this may be due to anxiety or stress.  This happens only intermittently and is not bothersome. REVIEW OF SYSTEMS: Out of a complete 14 system review of symptoms, the patient complains only of the following symptoms, and all other reviewed systems are negative. Left foot drop, gait difficulty, facial tics, stiffness of the leg only and all other systems negative ALLERGIES: Allergies  Allergen Reactions  . Bitolterol   . Bidil [Isosorb Dinitrate-Hydralazine] Other (See Comments)    Lethargic,tired     HOME MEDICATIONS: Outpatient Medications Prior to Visit  Medication Sig Dispense Refill  . amLODipine (NORVASC) 10 MG tablet Take 1 tablet (10 mg total) by mouth daily. 30 tablet 1  . Ascorbic Acid (VITAMIN C) 1000 MG tablet Take 1,000 mg by mouth daily.    Marland Kitchen atorvastatin (LIPITOR) 10 MG tablet Take 10 mg by mouth daily.    . chlorthalidone (HYGROTON) 25 MG tablet Take 25 mg by mouth daily.    . Cholecalciferol (VITAMIN  D-3) 1000 units CAPS Take by mouth daily.    . cloNIDine (CATAPRES) 0.3 MG tablet Take 0.3 mg by mouth 2 (two) times daily.    Marland Kitchen co-enzyme Q-10 30 MG capsule Take 30 mg by mouth daily.     Marland Kitchen ELIQUIS 5 MG TABS tablet TAKE ONE TABLET BY MOUTH TWICE A DAY 180 tablet 1  . hydrALAZINE (APRESOLINE) 10 MG tablet Take 10 mg by mouth daily.     Marland Kitchen levETIRAcetam (KEPPRA XR) 500 MG 24 hr tablet Take 2 tablets (1,000 mg total) by mouth at bedtime. 180 tablet 3  .  lisinopril (PRINIVIL,ZESTRIL) 40 MG tablet Take 1 tablet (40 mg total) by mouth daily. 30 tablet 1  . metFORMIN (GLUCOPHAGE-XR) 500 MG 24 hr tablet 500 mg 2 (two) times daily.    . Multiple Vitamin (MULTIVITAMIN) capsule Take 1 capsule by mouth daily.    . Multiple Vitamins-Minerals (PRESERVISION AREDS PO) Take by mouth daily.    . potassium chloride SA (K-DUR,KLOR-CON) 20 MEQ tablet Take 20 mEq by mouth 4 (four) times daily.     . vitamin E 400 UNIT capsule Take 400 Units by mouth daily.     No facility-administered medications prior to visit.    PAST MEDICAL HISTORY: Past Medical History:  Diagnosis Date  . Diabetes mellitus without complication ([place])   . Hyperlipemia   . Hypertension   . large right middle cerebral artery infarct, embolic 09/07/5460   a. s/p IV tPA, b. source unknown, c. loop recorder placed  . Snoring 07/03/2015    PAST SURGICAL HISTORY: Past Surgical History:  Procedure Laterality Date  . LOOP RECORDER IMPLANT  06/07/2013   MDT LinQ implanted by Dr [name] for cryptogenic stroke  . LOOP RECORDER IMPLANT N/A 06/07/2013   Procedure: LOOP RECORDER IMPLANT;  Surgeon: [name], MD;  Location: [place] CATH LAB;  Service: Cardiovascular;  Laterality: N/A;  . TEE WITHOUT CARDIOVERSION N/A 06/07/2013   Procedure: TRANSESOPHAGEAL ECHOCARDIOGRAM (TEE);  Surgeon: [name], MD;  Location: [facility] ENDOSCOPY;  Service: Cardiovascular;  Laterality: N/A;    FAMILY HISTORY: Family History  Problem Relation Age of Onset  . Dementia  Mother   . Diabetes Mother   . Heart attack Father     SOCIAL HISTORY: Social History   Socioeconomic History  . Marital status: Married    Spouse name: [name]  . Number of children: 2  . Years of education: College  . Highest education level: Not on file  Occupational History  . Occupation: Retired    Fish farm manager: Psychologist, educational  Tobacco Use  . Smoking status: Former Research scientist (life sciences)  . Smokeless tobacco: Never Used  . Tobacco comment: Quit 25 years ago.  Substance and Sexual Activity  . Alcohol use: Yes    Alcohol/week: 1.0 standard drinks    Types: 1 Glasses of wine per week    Comment: Rare  . Drug use: No  . Sexual activity: Never  Other Topics Concern  . Not on file  Social History Narrative   Patient lives at home [name]).   Retired works part time 10 hours a week.   Right handed   Education college   Caffeine: one cup daily coffee   No soda or tea.   Social Determinants of Health   Financial Resource Strain:   . Difficulty of Paying Living Expenses: Not on file  Food Insecurity:   . Worried About Charity fundraiser in the Last Year: Not on file  . Ran Out of Food in the Last Year: Not on file  Transportation Needs:   . Lack of Transportation (Medical): Not on file  . Lack of Transportation (Non-Medical): Not on file  Physical Activity:   . Days of Exercise per Week: Not on file  . Minutes of Exercise per Session: Not on file  Stress:   . Feeling of Stress : Not on file  Social Connections:   . Frequency of Communication with Friends and Family: Not on file  . Frequency of Social Gatherings with Friends and Family: Not on file  . Attends Religious Services: Not on file  .  Active Member of Clubs or Organizations: Not on file  . Attends Archivist Meetings: Not on file  . Marital Status: Not on file  Intimate Partner Violence:   . Fear of Current or Ex-Partner: Not on file  . Emotionally Abused: Not on file  . Physically Abused: Not on file  .  Sexually Abused: Not on file      PHYSICAL EXAM  Vitals:   07/25/19 1436  BP: (!) 147/97  Pulse: 62  Temp: (!) 97.2 F (36.2 C)  Weight: 106.5 kg  Height: _0  (1.905 m)   Body mass index is 29.34 kg/m.  Generalized:  Pleasant middle-age mildly obese Caucasian male not in distress. . Afebrile. Head is nontraumatic. Neck is supple without bruit.    Cardiac exam no murmur or gallop. Lungs are clear to auscultation. Distal pulses are well felt.  Neurological examination  Mentation: Alert oriented to time, place, history taking. Follows all commands speech and language fluent Cranial nerve II-XII: Pupils were equal round reactive to light. Extraocular movements were full, visual field were full on confrontational test. Facial sensation and strength were normal. Uvula tongue midline. Head turning and shoulder shrug  were normal and symmetric. Motor: The motor testing reveals 5 over 5 strength of all 4 extremities. LUE 4/5 with  Increased tone left leg  . Slight left foot drop.  Diminished fine finger movements on the left.  Orbits right over left upper extremity. Sensory: Sensory testing is intact to soft touch on all 4 extremities. No evidence of extinction is noted.  Coordination: Cerebellar testing reveals good finger-nose-finger and heel-to-shin bilaterally.  Gait and station: Gait is spastic hemiplegic with left foot drop and wearing AFO.  Drags left foot. Reflexes: Deep tendon reflexes are asymmetric and brisker on the left side  DIAGNOSTIC DATA (LABS, IMAGING, TESTING) - I reviewed patient records, labs, notes, testing and imaging myself where available.  Lab Results  Component Value Date   WBC 10.7 (H) 09/19/2014   HGB 13.9 09/19/2014   HCT 41.0 09/19/2014   MCV 92.3 09/19/2014   PLT 216 09/19/2014      Component Value Date/Time   NA 137 09/19/2014 1908   K 3.3 (L) 09/19/2014 1908   CL 99 09/19/2014 1908   CO2 20 09/19/2014 1856   GLUCOSE 131 (H) 09/19/2014 1908    BUN 18 09/19/2014 1908   CREATININE 1.00 09/19/2014 1908   CALCIUM 9.1 09/19/2014 1856   PROT 7.0 09/19/2014 1856   ALBUMIN 3.9 09/19/2014 1856   AST 24 09/19/2014 1856   ALT 13 09/19/2014 1856   ALKPHOS 58 09/19/2014 1856   BILITOT 1.1 09/19/2014 1856   GFRNONAA 65 (L) 09/19/2014 1856   GFRAA 76 (L) 09/19/2014 1856   Lab Results  Component Value Date   CHOL 139 09/20/2014   HDL 28 (L) 09/20/2014   LDLCALC 68 09/20/2014   TRIG 214 (H) 09/20/2014   CHOLHDL 5.0 09/20/2014   Lab Results  Component Value Date   HGBA1C 6.6 (H) 09/20/2014   No results found for: VITAMINB12 No results found for: TSH    ASSESSMENT AND PLAN 74 y.o. year old male  has a past medical history of Diabetes mellitus without complication ([place]), Hyperlipemia, Hypertension, large right middle cerebral artery infarct, embolic (01/07/4165), and Snoring (07/03/2015). here with:  1. 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 Seizures stable  I had a long discussion with the patient regarding his remote stroke, atrial fibrillation and  symptomatic seizure.  I recommend he continue Eliquis for stroke prevention and maintain aggressive risk factor modification with strict control of hypertension with blood pressure goal below 130/90, lipids with LDL cholesterol goal below 70 mg percent and hemoglobin A1c goal below 6.5.  He was advised to continue Keppra XR in the current dose of 1068m  at night.  He was given a refill for a year.  The patient plans on having penile implant surgery soon.  He is neurologically cleared with a small but acceptable periprocedural risk of stroke or TIA when he holds the Eliquis for 2 days prior to the procedure and resume soon after the procedure.  He understands the risk and plans to go ahead.Return for follow-up in the future in a year or call earlier if necessary  Greater than 50% time during this 25 minute visit was spent on counseling and coordination of care  about his spastic gait, left foot drop, seizures and stroke risk.  PAntony Contras MD 07/25/2019, 3:53 PM [facility] [address] [place] [address]([phone] Personally  participated in and made any corrections needed to history, physical, neuro exam,assessment and plan as stated above.   ASarina Ill MD [facility]

## 2019-07-25 NOTE — Patient Instructions (Signed)
I had a long discussion with the patient regarding his remote stroke, atrial fibrillation and symptomatic seizure.  I recommend he continue Eliquis for stroke prevention and maintain aggressive risk factor modification with strict control of hypertension with blood pressure goal below 130/90, lipids with LDL cholesterol goal below 70 mg percent and hemoglobin A1c goal below 6.5.  He was advised to continue Keppra XR in the current dose of 1000mg   at night.  He was given a refill for a year.  The patient plans on having penile implant surgery soon.  He is neurologically cleared with a small but acceptable periprocedural risk of stroke or TIA when he holds the Eliquis for 2 days prior to the procedure and resume soon after the procedure.  He understands the risk and plans to go ahead.Return for follow-up in the future in a year or call earlier if necessary

## 2019-07-26 DIAGNOSIS — M205X1 Other deformities of toe(s) (acquired), right foot: Secondary | ICD-10-CM | POA: Diagnosis not present

## 2019-07-26 DIAGNOSIS — E1351 Other specified diabetes mellitus with diabetic peripheral angiopathy without gangrene: Secondary | ICD-10-CM | POA: Diagnosis not present

## 2019-07-26 DIAGNOSIS — I739 Peripheral vascular disease, unspecified: Secondary | ICD-10-CM | POA: Diagnosis not present

## 2019-07-26 DIAGNOSIS — I6930 Unspecified sequelae of cerebral infarction: Secondary | ICD-10-CM | POA: Diagnosis not present

## 2019-07-26 DIAGNOSIS — E1140 Type 2 diabetes mellitus with diabetic neuropathy, unspecified: Secondary | ICD-10-CM | POA: Diagnosis not present

## 2019-07-26 DIAGNOSIS — M205X2 Other deformities of toe(s) (acquired), left foot: Secondary | ICD-10-CM | POA: Diagnosis not present

## 2019-07-26 DIAGNOSIS — B351 Tinea unguium: Secondary | ICD-10-CM | POA: Diagnosis not present

## 2019-07-27 ENCOUNTER — Telehealth: Payer: Self-pay | Admitting: Internal Medicine

## 2019-07-27 NOTE — Telephone Encounter (Signed)
° °  [facility] Pre-operative Risk Assessment    Request for surgical clearance:  1. What type of surgery is being performed? Three Piece Inflatable Penile Prothesis   2. When is this surgery scheduled? TBD  3. What type of clearance is required (medical clearance vs. Pharmacy clearance to hold med vs. Both)? Both  4. Are there any medications that need to be held prior to surgery and how long? Eliquis held 3 days prior   5. Practice name and name of physician performing surgery? [facility], Dr. Kathie Rhodes   6. What is your office phone number [phone]   7.   What is your office fax number [phone]  8.   Anesthesia type (None, local, MAC, general) ? General    Trilby Drummer 07/27/2019, 3:03 PM  _________________________________________________________________   (provider comments below)

## 2019-07-27 NOTE — Telephone Encounter (Signed)
   Primary Cardiologist: Thompson Grayer, MD  Chart reviewed as part of pre-operative protocol coverage. Patient was contacted 07/27/2019 in reference to pre-operative risk assessment for pending surgery as outlined below.  Jaycub Skillern was last seen on 05/17/19 by Malka So, PA-C for PAF.  Since that day, Zildjian Gandia has done well. He had negative nuc study in 2019 and has no angina and can meet 4 METS of activity without angina.   I explained to pt we would call how long to hold his eliquis.    Therefore, based on ACC/AHA guidelines, the patient would be at acceptable risk for the planned procedure without further cardiovascular testing.   I will route this recommendation to the requesting party via Epic fax function and remove from pre-op pool.  Please call with questions.  Cecilie Kicks, NP 07/27/2019, 4:28 PM

## 2019-07-27 NOTE — Telephone Encounter (Signed)
Pharm please address eliquis thanks 

## 2019-07-27 NOTE — Telephone Encounter (Signed)
Pt takes Eliquis for afib with CHADS2VASc score of 5 (age, HTN, DM, CVA). Renal function is normal.  Request is to hold Eliquis for 3 days, pt is high risk off of anticoagulation for > 24 hours due to hx of afib and CVA. Will defer to MD for input on anticoag length of hold.

## 2019-07-29 DIAGNOSIS — N529 Male erectile dysfunction, unspecified: Secondary | ICD-10-CM | POA: Diagnosis not present

## 2019-07-29 DIAGNOSIS — I6930 Unspecified sequelae of cerebral infarction: Secondary | ICD-10-CM | POA: Diagnosis not present

## 2019-07-29 DIAGNOSIS — E1169 Type 2 diabetes mellitus with other specified complication: Secondary | ICD-10-CM | POA: Diagnosis not present

## 2019-07-29 DIAGNOSIS — G40909 Epilepsy, unspecified, not intractable, without status epilepticus: Secondary | ICD-10-CM | POA: Diagnosis not present

## 2019-07-29 DIAGNOSIS — I4891 Unspecified atrial fibrillation: Secondary | ICD-10-CM | POA: Diagnosis not present

## 2019-07-29 DIAGNOSIS — I10 Essential (primary) hypertension: Secondary | ICD-10-CM | POA: Diagnosis not present

## 2019-08-01 NOTE — Telephone Encounter (Signed)
Dr. [name], see previous message, please comment on how long to hold eliquis and send your reply to P CV DIV PREOP

## 2019-08-02 NOTE — Telephone Encounter (Signed)
I agree with Jinny Blossom that stroke risks are high. I would advise to only hold eliquis for 24 hours.

## 2019-08-03 DIAGNOSIS — E1169 Type 2 diabetes mellitus with other specified complication: Secondary | ICD-10-CM | POA: Diagnosis not present

## 2019-08-03 DIAGNOSIS — I4891 Unspecified atrial fibrillation: Secondary | ICD-10-CM | POA: Diagnosis not present

## 2019-08-03 DIAGNOSIS — G40909 Epilepsy, unspecified, not intractable, without status epilepticus: Secondary | ICD-10-CM | POA: Diagnosis not present

## 2019-08-03 NOTE — Telephone Encounter (Signed)
Spoken with the patient, he is aware of Dr. Jackalyn Lombard recommendation

## 2019-08-03 NOTE — Telephone Encounter (Signed)
I called back but did not reach the patient, will attempt again in 10 min

## 2019-08-03 NOTE — Telephone Encounter (Signed)
Follow up  Pt returning call regarding his surgical clearance  Please call

## 2019-08-03 NOTE — Telephone Encounter (Signed)
Cardiac clearance has been forwarded to Dr. Simone Curia office to see if he would be agreeable to the 24 hour holding period. During the mean time, I have attempted to call the patient and discuss with him regarding Dr. Jackalyn Lombard recommendation, however he did not pick up the phone. I left a message for him to call us and speak to the on call preop APP of the day

## 2019-08-04 DIAGNOSIS — I739 Peripheral vascular disease, unspecified: Secondary | ICD-10-CM | POA: Diagnosis not present

## 2019-08-22 ENCOUNTER — Other Ambulatory Visit: Payer: Self-pay | Admitting: Internal Medicine

## 2019-08-22 NOTE — Telephone Encounter (Signed)
Prescription refill request for Eliquis received.  Last office visit: [place], 05/17/2019 Scr: 0.96 08/03/2019, via kpn Age: 73 y.o. Weight: 106.5 kg   Prescription refill sent.

## 2019-08-23 DIAGNOSIS — N5201 Erectile dysfunction due to arterial insufficiency: Secondary | ICD-10-CM | POA: Diagnosis not present

## 2019-08-24 ENCOUNTER — Other Ambulatory Visit: Payer: Self-pay

## 2019-08-24 ENCOUNTER — Telehealth: Payer: Self-pay | Admitting: Neurology

## 2019-08-24 MED ORDER — LEVETIRACETAM ER 500 MG PO TB24: 1000.0000 mg | ORAL_TABLET | Freq: Every day | ORAL | 3 refills | Status: DC

## 2019-08-24 NOTE — Telephone Encounter (Signed)
1) Medication(s) Requested (by name): levETIRAcetam (KEPPRA XR) 500 MG 24 hr tablet   2) Pharmacy of Choice:  [facility] [address] in [place]

## 2019-08-24 NOTE — Telephone Encounter (Signed)
Refill sent to [facility] [address] 

## 2019-08-26 DIAGNOSIS — I872 Venous insufficiency (chronic) (peripheral): Secondary | ICD-10-CM | POA: Diagnosis not present

## 2019-12-02 DIAGNOSIS — I10 Essential (primary) hypertension: Secondary | ICD-10-CM | POA: Diagnosis not present

## 2019-12-02 DIAGNOSIS — I4891 Unspecified atrial fibrillation: Secondary | ICD-10-CM | POA: Diagnosis not present

## 2019-12-02 DIAGNOSIS — Z1211 Encounter for screening for malignant neoplasm of colon: Secondary | ICD-10-CM | POA: Diagnosis not present

## 2019-12-02 DIAGNOSIS — G40909 Epilepsy, unspecified, not intractable, without status epilepticus: Secondary | ICD-10-CM | POA: Diagnosis not present

## 2019-12-02 DIAGNOSIS — I6930 Unspecified sequelae of cerebral infarction: Secondary | ICD-10-CM | POA: Diagnosis not present

## 2019-12-02 DIAGNOSIS — E1169 Type 2 diabetes mellitus with other specified complication: Secondary | ICD-10-CM | POA: Diagnosis not present

## 2019-12-02 DIAGNOSIS — R351 Nocturia: Secondary | ICD-10-CM | POA: Diagnosis not present

## 2019-12-02 DIAGNOSIS — E876 Hypokalemia: Secondary | ICD-10-CM | POA: Diagnosis not present

## 2019-12-02 DIAGNOSIS — Z0000 Encounter for general adult medical examination without abnormal findings: Secondary | ICD-10-CM | POA: Diagnosis not present

## 2020-01-05 DIAGNOSIS — B351 Tinea unguium: Secondary | ICD-10-CM | POA: Diagnosis not present

## 2020-02-03 DIAGNOSIS — Z23 Encounter for immunization: Secondary | ICD-10-CM | POA: Diagnosis not present

## 2020-02-16 ENCOUNTER — Other Ambulatory Visit: Payer: Self-pay | Admitting: Internal Medicine

## 2020-02-16 NOTE — Telephone Encounter (Signed)
Eliquis 5mg  refill request received. Patient is 74 years old, weight-106.5kg, Crea-0.93 on 12/02/2019 via [facility] at [place], [place], and last seen by Adline Peals, PA on 05/17/2019. Dose is appropriate based on dosing criteria. Will send in refill to requested pharmacy.

## 2020-02-24 DIAGNOSIS — Z23 Encounter for immunization: Secondary | ICD-10-CM | POA: Diagnosis not present

## 2020-04-05 DIAGNOSIS — I739 Peripheral vascular disease, unspecified: Secondary | ICD-10-CM | POA: Diagnosis not present

## 2020-04-05 DIAGNOSIS — B351 Tinea unguium: Secondary | ICD-10-CM | POA: Diagnosis not present

## 2020-04-06 DIAGNOSIS — I10 Essential (primary) hypertension: Secondary | ICD-10-CM | POA: Diagnosis not present

## 2020-04-06 DIAGNOSIS — E876 Hypokalemia: Secondary | ICD-10-CM | POA: Diagnosis not present

## 2020-04-06 DIAGNOSIS — I6930 Unspecified sequelae of cerebral infarction: Secondary | ICD-10-CM | POA: Diagnosis not present

## 2020-04-06 DIAGNOSIS — R531 Weakness: Secondary | ICD-10-CM | POA: Diagnosis not present

## 2020-04-06 DIAGNOSIS — G40909 Epilepsy, unspecified, not intractable, without status epilepticus: Secondary | ICD-10-CM | POA: Diagnosis not present

## 2020-04-06 DIAGNOSIS — E1169 Type 2 diabetes mellitus with other specified complication: Secondary | ICD-10-CM | POA: Diagnosis not present

## 2020-04-06 DIAGNOSIS — Z23 Encounter for immunization: Secondary | ICD-10-CM | POA: Diagnosis not present

## 2020-04-06 DIAGNOSIS — I4891 Unspecified atrial fibrillation: Secondary | ICD-10-CM | POA: Diagnosis not present

## 2020-04-18 ENCOUNTER — Encounter: Payer: Medicare Other | Admitting: Internal Medicine

## 2020-05-03 ENCOUNTER — Encounter: Payer: Self-pay | Admitting: Internal Medicine

## 2020-07-03 DIAGNOSIS — M6281 Muscle weakness (generalized): Secondary | ICD-10-CM | POA: Diagnosis not present

## 2020-07-03 DIAGNOSIS — R260 Ataxic gait: Secondary | ICD-10-CM | POA: Diagnosis not present

## 2020-07-04 ENCOUNTER — Other Ambulatory Visit: Payer: Self-pay

## 2020-07-04 ENCOUNTER — Encounter: Payer: Self-pay | Admitting: Internal Medicine

## 2020-07-04 ENCOUNTER — Ambulatory Visit (INDEPENDENT_AMBULATORY_CARE_PROVIDER_SITE_OTHER): Payer: Medicare Other | Admitting: Internal Medicine

## 2020-07-04 VITALS — BP 128/82 | HR 90 | Ht 75.0 in | Wt 235.6 lb

## 2020-07-04 DIAGNOSIS — I4821 Permanent atrial fibrillation: Secondary | ICD-10-CM | POA: Diagnosis not present

## 2020-07-04 DIAGNOSIS — I10 Essential (primary) hypertension: Secondary | ICD-10-CM | POA: Diagnosis not present

## 2020-07-04 DIAGNOSIS — D6869 Other thrombophilia: Secondary | ICD-10-CM

## 2020-07-04 NOTE — Progress Notes (Signed)
PCP: London Pepper, MD   Primary EP: Dr Maryjane Hurter is a 75 y.o. male who presents today for routine electrophysiology followup.  Since last being seen in our clinic, the patient reports doing very well. He is not very active.  He has residual deficits from his prior stroke. Today, he denies symptoms of palpitations, chest pain, shortness of breath, dizziness, presyncope, or syncope.  The patient is otherwise without complaint today.   Past Medical History:  Diagnosis Date  . Diabetes mellitus without complication ([place])   . Hyperlipemia   . Hypertension   . large right middle cerebral artery infarct, embolic 0/12/6576   a. s/p IV tPA, b. source unknown, c. loop recorder placed  . Snoring 07/03/2015   Past Surgical History:  Procedure Laterality Date  . LOOP RECORDER IMPLANT  06/07/2013   MDT LinQ implanted by Dr [name] for cryptogenic stroke  . LOOP RECORDER IMPLANT N/A 06/07/2013   Procedure: LOOP RECORDER IMPLANT;  Surgeon: [name], MD;  Location: [place] [facility] CATH LAB;  Service: Cardiovascular;  Laterality: N/A;  . TEE WITHOUT CARDIOVERSION N/A 06/07/2013   Procedure: TRANSESOPHAGEAL ECHOCARDIOGRAM (TEE);  Surgeon: [name], MD;  Location: [facility] ENDOSCOPY;  Service: Cardiovascular;  Laterality: N/A;    ROS- all systems are reviewed and negatives except as per HPI above  Current Outpatient Medications  Medication Sig Dispense Refill  . amLODipine (NORVASC) 10 MG tablet Take 1 tablet (10 mg total) by mouth daily. 30 tablet 1  . Ascorbic Acid (VITAMIN C) 1000 MG tablet Take 1,000 mg by mouth daily.    Marland Kitchen atorvastatin (LIPITOR) 10 MG tablet Take 10 mg by mouth daily.    . chlorthalidone (HYGROTON) 25 MG tablet Take 25 mg by mouth daily.    . Cholecalciferol (VITAMIN D-3) 1000 units CAPS Take by mouth daily.    . cloNIDine (CATAPRES) 0.3 MG tablet Take 0.3 mg by mouth 2 (two) times daily.    Marland Kitchen co-enzyme Q-10 30 MG capsule Take 30 mg by mouth daily.     Marland Kitchen ELIQUIS 5 MG  TABS tablet TAKE 1 TABLET BY MOUTH TWICE A DAY 60 tablet 5  . hydrALAZINE (APRESOLINE) 10 MG tablet Take 10 mg by mouth daily.     Marland Kitchen levETIRAcetam (KEPPRA XR) 500 MG 24 hr tablet Take 2 tablets (1,000 mg total) by mouth at bedtime. 180 tablet 3  . lisinopril (PRINIVIL,ZESTRIL) 40 MG tablet Take 1 tablet (40 mg total) by mouth daily. 30 tablet 1  . metFORMIN (GLUCOPHAGE-XR) 500 MG 24 hr tablet 500 mg 2 (two) times daily.    . Multiple Vitamin (MULTIVITAMIN) capsule Take 1 capsule by mouth daily.    . Multiple Vitamins-Minerals (PRESERVISION AREDS PO) Take by mouth daily.    . potassium chloride SA (K-DUR,KLOR-CON) 20 MEQ tablet Take 20 mEq by mouth 4 (four) times daily.     . vitamin E 400 UNIT capsule Take 400 Units by mouth daily.     No current facility-administered medications for this visit.    Physical Exam: Vitals:   07/04/20 1549  BP: 128/82  Pulse: 90  SpO2: 95%  Weight: 235 lb 9.6 oz (106.9 kg)  Height: 6\' 3"  (1.905 m)    GEN- The patient is chronically ill appearing, alert and oriented x 3 today.   Head- normocephalic, atraumatic Eyes-  Sclera clear, conjunctiva pink Ears- hearing intact Oropharynx- clear Lungs-  normal work of breathing Heart- irregular rate and rhythm  GI- soft  Extremities- no  clubbing, cyanosis, + edema Walks with a cane slowly  Wt Readings from Last 3 Encounters:  07/04/20 235 lb 9.6 oz (106.9 kg)  07/25/19 234 lb 11.2 oz (106.5 kg)  05/17/19 234 lb 6.4 oz (106.3 kg)    EKG tracing ordered today is personally reviewed and shows afib  Assessment and Plan:  1. Permanent afib Rate controlled asymptmoatic On eliquis for chads2vasc score of 5 Echo ordered to evaluate for structural changes related to afib.  His ILR is no longer functioning.    Risks of ILR removal including but not limited to bleeding and infection were discussed with the patient today.  He is clear that he does not wish to have his ILR removed at this time.  We will honor  his wishes.  2. HTN Stable No change required today  3. Prior stroke Continue long term anticoagulation.  Risks, benefits and potential toxicities for medications prescribed and/or refilled reviewed with patient today.   Thompson Grayer MD, [facility] 07/04/2020 3:50 PM

## 2020-07-04 NOTE — Patient Instructions (Signed)
Medication Instructions:  Your physician recommends that you continue on your current medications as directed. Please refer to the Current Medication list given to you today.  Labwork: None ordered.  Testing/Procedures: please schedule Echo Your physician has requested that you have an echocardiogram. Echocardiography is a painless test that uses sound waves to create images of your heart. It provides your doctor with information about the size and shape of your heart and how well your heart's chambers and valves are working. This procedure takes approximately one hour. There are no restrictions for this procedure.  Follow-Up:  Your physician wants you to follow-up in: one year with   Tommye Standard, PA-C    Any Other Special Instructions Will Be Listed Below (If Applicable).  If you need a refill on your cardiac medications before your next appointment, please call your pharmacy.

## 2020-07-06 DIAGNOSIS — E1151 Type 2 diabetes mellitus with diabetic peripheral angiopathy without gangrene: Secondary | ICD-10-CM | POA: Diagnosis not present

## 2020-07-06 DIAGNOSIS — E1142 Type 2 diabetes mellitus with diabetic polyneuropathy: Secondary | ICD-10-CM | POA: Diagnosis not present

## 2020-07-06 DIAGNOSIS — B351 Tinea unguium: Secondary | ICD-10-CM | POA: Diagnosis not present

## 2020-07-06 DIAGNOSIS — I6930 Unspecified sequelae of cerebral infarction: Secondary | ICD-10-CM | POA: Diagnosis not present

## 2020-07-06 DIAGNOSIS — I739 Peripheral vascular disease, unspecified: Secondary | ICD-10-CM | POA: Diagnosis not present

## 2020-07-10 DIAGNOSIS — R260 Ataxic gait: Secondary | ICD-10-CM | POA: Diagnosis not present

## 2020-07-10 DIAGNOSIS — M6281 Muscle weakness (generalized): Secondary | ICD-10-CM | POA: Diagnosis not present

## 2020-07-12 DIAGNOSIS — R260 Ataxic gait: Secondary | ICD-10-CM | POA: Diagnosis not present

## 2020-07-12 DIAGNOSIS — M6281 Muscle weakness (generalized): Secondary | ICD-10-CM | POA: Diagnosis not present

## 2020-07-13 ENCOUNTER — Encounter (HOSPITAL_COMMUNITY): Payer: Self-pay | Admitting: Internal Medicine

## 2020-07-16 ENCOUNTER — Telehealth (HOSPITAL_COMMUNITY): Payer: Self-pay | Admitting: Internal Medicine

## 2020-07-16 NOTE — Telephone Encounter (Signed)
Patient spouse left voicemail on 07/13/20 and states she has reached out to schedule echocardiogram 2 times.  Please see below attempts to contact patient without success.   07/16/20 Returned pts wife call x2 @ 7:54 and no vm is available/LBW  07/13/20 MAILED LETTER [facility] called hm # ALSO called cell# and LVM @ 7:55/LBW  07/10/20 Returned pt spouse call @ 1:37 VM wasfullandunable to leave message @ 1:37/LBW  07/10/20 [facility] to schedule @ 12:07 [facility]  07-05-19 pt will call to schedule echo.dp

## 2020-07-17 DIAGNOSIS — R260 Ataxic gait: Secondary | ICD-10-CM | POA: Diagnosis not present

## 2020-07-17 DIAGNOSIS — M6281 Muscle weakness (generalized): Secondary | ICD-10-CM | POA: Diagnosis not present

## 2020-07-19 DIAGNOSIS — M6281 Muscle weakness (generalized): Secondary | ICD-10-CM | POA: Diagnosis not present

## 2020-07-19 DIAGNOSIS — R260 Ataxic gait: Secondary | ICD-10-CM | POA: Diagnosis not present

## 2020-07-24 ENCOUNTER — Ambulatory Visit (INDEPENDENT_AMBULATORY_CARE_PROVIDER_SITE_OTHER): Payer: Medicare Other | Admitting: Neurology

## 2020-07-24 ENCOUNTER — Encounter: Payer: Self-pay | Admitting: Neurology

## 2020-07-24 VITALS — BP 155/99 | Ht 75.0 in | Wt 238.0 lb

## 2020-07-24 DIAGNOSIS — I6522 Occlusion and stenosis of left carotid artery: Secondary | ICD-10-CM

## 2020-07-24 MED ORDER — LEVETIRACETAM ER 500 MG PO TB24: 1000.0000 mg | ORAL_TABLET | Freq: Every day | ORAL | 3 refills | Status: DC

## 2020-07-24 NOTE — Progress Notes (Signed)
PATIENT: Donald [name] DOB: Jul 26, 1945  REASON FOR VISIT: follow up- stroke, seizures HISTORY FROM: patient  HISTORY OF PRESENT ILLNESS: Donald [name] is a 75 year old male with a history of stroke and seizures. He returns today for follow-up. He continues on Eliquis and is tolerating it well. His blood pressure is slightly elevated today. His primary care is managing his hypertension, hyperlipidemia and diabetes. He reports overall he is doing well. He uses a cane when ambulating. Went to PT had good benefit. Using AFO on brace on the left.Denies any falls. He continues on Keppra XR1000 mg at bedtime. He denies any seizure events. He operates a Teacher, music without difficulty. Patient is asking about driving. He returns today for an evaluation.  HISTORY 07/09/15:Donald [name]  Is a 75 year old male with a history of stroke and seizures. He returns today for follow-up. The patient was switched to Eliquis by his cardiologist after he was found to have atrial fibrillation in October. He is tolerating this medication well. The patient's blood pressure is elevated slightly today however this is consistent with every time he comes in to the doctor's office. He states that he has whitecoat syndrome. His primary care is managing his cholesterol  And diabetes. He denies any additional strokelike symptoms. He does not have any weakness from the stroke but does feel like his reaction time on the left side is slower. He uses a cane when  Ambulating.  The patient states that he has a hard time with  ambulatingup or down steps. He is wondering if physical therapy would be beneficial. He continues to use Keppra XR thousand milligrams at bedtime. He is not had any additional seizure events. He denies any new neurological symptoms. He returns today for an evaluation.  HISTORY 01/08/15: Donald [name] is a 75 year old male with a history of ischemic stroke and seizures. He returns today for follow-up. The  patient continues to take Plavix for stroke prevention. The patient's primary care manages his blood pressure, cholesterol and diabetes. Patient states that his blood pressure is slightly elevated today. However he relates this to "white coat syndrome." Patient states that he had a seizure in April at that time he was placed on Keppra XR 1000 mg at bedtime. He states that since then he has not had any seizures. He does not operate a motor vehicle. He is able to complete all ADLs independently. Patient states that after the stroke he has had some numbness in the bottom of the left foot and that affects his balance. He continues to use a cane when ambulating. Denies any recent falls. Denies any new neurological symptoms. He returns today for an evaluation.  HISTORY 07/05/14:Donald [name] is a 75 year old male with a history of stroke on 06/03/13. He returns today for follow-up. He is currently taking Plavix for stroke prevention. His HTN, cholesterol and Diabetes is managed by his PCP. He states that his BP has been controlled. When he checks it at home it is usually 130s/70s. His states that his last hemoglobin A1C was 5.9 % . He continues to take metformin- his dosage has recently been decreased. His cholesterol has been in normal range according to the patient and therefore medication has not been required. Patient will use a cane when he is out in public. He does not use it at home. He feels that his walking has improved. Denies any falls. He notes that his fine motor skills in the left hand has improved. He states that he is  now able to cook at home. He does have some sensory changes in the left hand and foot. Denies any new symptoms.   HISTORY 12/20/13 ([name]): is an 75 y.o. male who was visiting here for the holidays on 06/03/2013. When he got up he was normal. As he was returning to bed began to be short of breath. Patient then was noted to have slurred speech by his wife and she noted a facial droop.  Patient seemed to be weak on the left a well. EMS was called and the patient was brought in as a code stroke. Initial NIHSS of 8. Patient was given TPA. No intervenable lesion was seen on CTA. He was admitted to the neuro ICU for further evaluation and treatment. CT angio head showed Subtle attenuation of the distal right superior MCA territory branch vessels as compared to the left. No proximal branch occlusion or high-grade flow-limiting stenosis identified within the right middle cerebral artery proximally. Otherwise unremarkable CTA of the head without evidence of occlusion, high-grade stenosis, or aneurysm elsewhere within the brain.  CT angio neck was a normal CTA of the neck without evidence of high-grade stenosis, occlusion, or dissection. 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 MRI of the brain showed an acute large right middle cerebral artery territory infarct. Minimal underlying petechial hemorrhage without lobar hematoma. 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 Minimal additional white matter changes suggest chronic small vessel ischemic disease. Acute on chronic paranasal sinusitis. 2D Echocardiogram with the estimated ejection fraction was in the range of 60% to 65%. He was admitted to [facility] 06/07/2013 through 07/02/2013. He is completed home physical therapy and occupational therapy and is about to start going to outpatient neuro rehabilitation. He is making good progress with his rehabilitation and is very determined. He and his wife have moved in with her daughter here in [place]. He has established care with Dr. Orland Mustard and is seeing him every 3 weeks for adjustments to his hypertensive medications, as his hypertension has been very difficult to control since his 41s. He thinks he forgot to take his blood pressure medicine the day he had the stroke. His blood pressure is elevated office today at 161/90, he is  tolerating Plavix well without any significant bruising or bleeding. He is able to ambulate short distances with a hemiwalker and AFO brace on the left. He is having some spasticity in the left arm with clonus at times.    Update 07/14/2016 : He returns for follow-up after last visit 6 months ago. He is a complaint by his wife. He states is doing well without recurrent stroke or TIA symptoms. He remains on eliquis which is tolerating well without bleeding or bruising. He states his blood pressure is usually well controlled at home in the 7:84 systolic range but does tend to go up when he visits physicians. Today it is 151/90 in office. He states his diabetes is well controlled and last him about an A1c was 5.9. He does not check his fasting sugars. He is tolerating Keppra XR 1000 mg daily well without side effects and states he is not had any seizures for more than 2 years. He still has some difficulty walking but wears an AFO and walks with stiffness of his left leg. He is a height but no falls or injuries. He has not had any follow-up carotid ultrasound done for more than 2 years. Updated 07/15/2018 : He returns for follow-up after last visit 2  years ago.  Continues to do well and has not had recurrent stroke or TIA symptoms since 2015.  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  He walks with a cane with stiffness of his left leg.  He has had no falls or injuries.  He has had no recurrent stroke or TIA symptoms.  He remains on Eliquis which is tolerating well with only minor bruising.  He states his fasting sugars have all been good and last hemoglobin A1c was 6.8.  His blood pressure is well controlled though it is slightly elevated in office today at 144/82.  He has not had any seizures since 2016 and remains on Keppra XR 1 gm at night and is tolerating it well without any side effects.  He is considering having penile implant surgery and has already met with cardiologist Dr.  [name] and got cardiac clearance.  He underwent medical stress test which went fine.  He has no new complaints today. Update 07/25/2019 : He returns for follow-up after last visit a year ago.  Continues to do well and has not had any recurrent stroke or TIA symptoms since his original stroke in 2015.  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  He remains on Eliquis which is tolerating well without bruising or bleeding.  Is also tolerating Lipitor 10 mg well without muscle aches or pains.  His blood pressure is usually in the 130s at home though today it is slightly elevated in office at 147/97.  He did have a follow-up carotid ultrasound done after last visit on 07/26/2018 which showed no significant extracranial carotid stenosis on either side.  Patient states he may be under stress recently since his 62 year old mother-in-law has moved into live with him and his wife for the last 1 year.  He feels at times he is having some tics around his lips but he thinks this may be due to anxiety or stress.  This happens only intermittently and is not bothersome. Update 07/24/2020: He returns for follow-up after last visit a year ago.  Continues to do well and has not had any any recurrent seizures or strokes or TIA symptoms.  Is tolerating Keppra well without any side effects.  He remains on Eliquis which is tolerating well without any bleeding and only minor bruising.  Blood pressure is quite well controlled at home though today it is elevated in office at 155/99.  His diabetes control is good and last A1c was 7.1.  He has gained about 8 pounds and is trying to lose weight.  He remains on Pravachol which is tolerating well without side effects and last lipid profile was satisfactory 3 months ago.  He is recently started doing some outpatient therapy to improve his walking and balance.  He has no new complaints today. REVIEW OF SYSTEMS: Out of a complete 14 system review of symptoms, the patient  complains only of the following symptoms, and all other reviewed systems are negative. Left foot drop, gait difficulty, facial tics, stiffness of the leg only and all other systems negative ALLERGIES: Allergies  Allergen Reactions  . Bitolterol   . Bidil [Isosorb Dinitrate-Hydralazine] Other (See Comments)    Lethargic,tired     HOME MEDICATIONS: Outpatient Medications Prior to Visit  Medication Sig Dispense Refill  . amLODipine (NORVASC) 10 MG tablet Take 1 tablet (10 mg total) by mouth daily. 30 tablet 1  . Apoaequorin (PREVAGEN PO) Take by mouth.    Marland Kitchen  Ascorbic Acid (VITAMIN C) 1000 MG tablet Take 1,000 mg by mouth daily.    . chlorthalidone (HYGROTON) 25 MG tablet Take 25 mg by mouth daily.    . Cholecalciferol (VITAMIN D-3) 1000 units CAPS Take by mouth daily.    . cloNIDine (CATAPRES) 0.3 MG tablet Take 0.3 mg by mouth 2 (two) times daily.    Marland Kitchen co-enzyme Q-10 30 MG capsule Take 30 mg by mouth daily.     Marland Kitchen ELIQUIS 5 MG TABS tablet TAKE 1 TABLET BY MOUTH TWICE A DAY 60 tablet 5  . hydrALAZINE (APRESOLINE) 10 MG tablet Take 10 mg by mouth daily.     Marland Kitchen lisinopril (PRINIVIL,ZESTRIL) 40 MG tablet Take 1 tablet (40 mg total) by mouth daily. 30 tablet 1  . metFORMIN (GLUCOPHAGE-XR) 500 MG 24 hr tablet 500 mg 2 (two) times daily.    . Multiple Vitamin (MULTIVITAMIN) capsule Take 1 capsule by mouth daily.    . potassium chloride SA (K-DUR,KLOR-CON) 20 MEQ tablet Take 20 mEq by mouth 4 (four) times daily.     . pravastatin (PRAVACHOL) 10 MG tablet Take 10 mg by mouth daily.    . vitamin E 400 UNIT capsule Take 400 Units by mouth daily.    Marland Kitchen zinc gluconate 50 MG tablet Take 50 mg by mouth daily.    Marland Kitchen levETIRAcetam (KEPPRA XR) 500 MG 24 hr tablet Take 2 tablets (1,000 mg total) by mouth at bedtime. 180 tablet 3  . Multiple Vitamins-Minerals (PRESERVISION AREDS PO) Take by mouth daily.     No facility-administered medications prior to visit.    PAST MEDICAL HISTORY: Past Medical History:   Diagnosis Date  . Diabetes mellitus without complication ([place])   . Hyperlipemia   . Hypertension   . large right middle cerebral artery infarct, embolic 02/02/4267   a. s/p IV tPA, b. source unknown, c. loop recorder placed  . Snoring 07/03/2015    PAST SURGICAL HISTORY: Past Surgical History:  Procedure Laterality Date  . LOOP RECORDER IMPLANT  06/07/2013   MDT LinQ implanted by Dr [name] for cryptogenic stroke  . LOOP RECORDER IMPLANT N/A 06/07/2013   Procedure: LOOP RECORDER IMPLANT;  Surgeon: [name], MD;  Location: [place] CATH LAB;  Service: Cardiovascular;  Laterality: N/A;  . TEE WITHOUT CARDIOVERSION N/A 06/07/2013   Procedure: TRANSESOPHAGEAL ECHOCARDIOGRAM (TEE);  Surgeon: [name], MD;  Location: [facility] ENDOSCOPY;  Service: Cardiovascular;  Laterality: N/A;    FAMILY HISTORY: Family History  Problem Relation Age of Onset  . Dementia Mother   . Diabetes Mother   . Heart attack Father     SOCIAL HISTORY: Social History   Socioeconomic History  . Marital status: Married    Spouse name: [name]  . Number of children: 2  . Years of education: College  . Highest education level: Not on file  Occupational History  . Occupation: Retired    Fish farm manager: Psychologist, educational  Tobacco Use  . Smoking status: Former Research scientist (life sciences)  . Smokeless tobacco: Never Used  . Tobacco comment: Quit 25 years ago.  Substance and Sexual Activity  . Alcohol use: Yes    Alcohol/week: 1.0 standard drink    Types: 1 Glasses of wine per week    Comment: Rare  . Drug use: No  . Sexual activity: Never  Other Topics Concern  . Not on file  Social History Narrative   Patient lives at home [name]).   Right handed   Drinks no caffeine daily   Social  Determinants of Health   Financial Resource Strain: Not on file  Food Insecurity: Not on file  Transportation Needs: Not on file  Physical Activity: Not on file  Stress: Not on file  Social Connections: Not on file  Intimate Partner Violence:  Not on file      PHYSICAL EXAM  Vitals:   07/24/20 1518  Weight: 238 lb (108 kg)  Height: $Remove'6\' 3"'[id number]$  (1.905 m)   Body mass index is 29.75 kg/m.  Generalized:  Pleasant middle-age mildly obese Caucasian male not in distress. . Afebrile. Head is nontraumatic. Neck is supple without bruit.    Cardiac exam no murmur or gallop afib. Lungs are clear to auscultation. Distal pulses are well felt.  Neurological examination  Mentation: Alert oriented to time, place, history taking. Follows all commands speech and language fluent Cranial nerve II-XII: Pupils were equal round reactive to light. Extraocular movements were full, visual field were full on confrontational test. Facial sensation and strength were normal. Uvula tongue midline. Head turning and shoulder shrug  were normal and symmetric. Motor: The motor testing reveals 5 over 5 strength of all 4 extremities. LUE 4/5 with  Increased tone left leg  . Slight left foot drop.  Diminished fine finger movements on the left.  Orbits right over left upper extremity. Sensory: Sensory testing is intact to soft touch on all 4 extremities. No evidence of extinction is noted.  Coordination: Cerebellar testing reveals good finger-nose-finger and heel-to-shin bilaterally.  Gait and station: Gait is spastic hemiplegic with left foot drop and wearing AFO.  Drags left foot. Reflexes: Deep tendon reflexes are asymmetric and brisker on the left side  DIAGNOSTIC DATA (LABS, IMAGING, TESTING) - I reviewed patient records, labs, notes, testing and imaging myself where available.  Lab Results  Component Value Date   WBC 10.7 (H) 09/19/2014   HGB 13.9 09/19/2014   HCT 41.0 09/19/2014   MCV 92.3 09/19/2014   PLT 216 09/19/2014      Component Value Date/Time   NA 137 09/19/2014 1908   K 3.3 (L) 09/19/2014 1908   CL 99 09/19/2014 1908   CO2 20 09/19/2014 1856   GLUCOSE 131 (H) 09/19/2014 1908   BUN 18 09/19/2014 1908   CREATININE 1.00 09/19/2014 1908    CALCIUM 9.1 09/19/2014 1856   PROT 7.0 09/19/2014 1856   ALBUMIN 3.9 09/19/2014 1856   AST 24 09/19/2014 1856   ALT 13 09/19/2014 1856   ALKPHOS 58 09/19/2014 1856   BILITOT 1.1 09/19/2014 1856   GFRNONAA 65 (L) 09/19/2014 1856   GFRAA 76 (L) 09/19/2014 1856   Lab Results  Component Value Date   CHOL 139 09/20/2014   HDL 28 (L) 09/20/2014   LDLCALC 68 09/20/2014   TRIG 214 (H) 09/20/2014   CHOLHDL 5.0 09/20/2014   Lab Results  Component Value Date   HGBA1C 6.6 (H) 09/20/2014   No results found for: VITAMINB12 No results found for: TSH    ASSESSMENT AND PLAN 75 y.o. year old male  has a past medical history of Diabetes mellitus without complication ([place]), Hyperlipemia, Hypertension, large right middle cerebral artery infarct, embolic (11/01/7033), and Snoring (07/03/2015). here with:  1. 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 Seizures stable  I had a long discussion with the patient about his remote stroke and seizure disorder both of which seem quite stable.  Continue Eliquis for stroke prevention for his atrial fibrillation and maintain aggressive risk factor modification with strict control of  hypertension with blood pressure goal below 130/90, lipids with LDL cholesterol goal below 70 mg percent and diabetes with hemoglobin A1c goal below 6.5%.  Continue ongoing physical therapy for his gait and balance training.  Check screening carotid ultrasound study.  Continue Keppra XR 1 gm at night for seizure prophylaxis.  We discussed possibility of reducing the dose but patient is reluctant to do so.  Return for follow-up in the future in a year or call earlier if necessary. Greater than 50% time during this 25 minute visit was spent on counseling and coordination of care about his spastic gait, left foot drop, seizures and stroke risk.  Antony Contras, MD 07/24/2020, 4:54 PM [facility] [address], [place], [place]  [address] [phone]  Personally  participated in and made any corrections needed to history, physical, neuro exam,assessment and plan as stated above.   Sarina Ill, MD [facility]

## 2020-07-24 NOTE — Patient Instructions (Signed)
I had a long discussion with the patient about his remote stroke and seizure disorder both of which seem quite stable.  Continue Eliquis for stroke prevention for his atrial fibrillation and maintain aggressive risk factor modification with strict control of hypertension with blood pressure goal below 130/90, lipids with LDL cholesterol goal below 70 mg percent and diabetes with hemoglobin A1c goal below 6.5%.  Continue ongoing physical therapy for his gait and balance training.  Check screening carotid ultrasound study.  Continue Keppra XR 1 gm at night for seizure prophylaxis.  We discussed possibility of reducing the dose but patient is reluctant to do so.  Return for follow-up in the future in a year or call earlier if necessary.

## 2020-07-26 DIAGNOSIS — R260 Ataxic gait: Secondary | ICD-10-CM | POA: Diagnosis not present

## 2020-07-26 DIAGNOSIS — M6281 Muscle weakness (generalized): Secondary | ICD-10-CM | POA: Diagnosis not present

## 2020-07-31 ENCOUNTER — Ambulatory Visit (HOSPITAL_COMMUNITY): Payer: Medicare Other

## 2020-07-31 DIAGNOSIS — R260 Ataxic gait: Secondary | ICD-10-CM | POA: Diagnosis not present

## 2020-07-31 DIAGNOSIS — M6281 Muscle weakness (generalized): Secondary | ICD-10-CM | POA: Diagnosis not present

## 2020-08-02 DIAGNOSIS — M6281 Muscle weakness (generalized): Secondary | ICD-10-CM | POA: Diagnosis not present

## 2020-08-02 DIAGNOSIS — R260 Ataxic gait: Secondary | ICD-10-CM | POA: Diagnosis not present

## 2020-08-07 DIAGNOSIS — M6281 Muscle weakness (generalized): Secondary | ICD-10-CM | POA: Diagnosis not present

## 2020-08-07 DIAGNOSIS — R260 Ataxic gait: Secondary | ICD-10-CM | POA: Diagnosis not present

## 2020-08-09 DIAGNOSIS — M6281 Muscle weakness (generalized): Secondary | ICD-10-CM | POA: Diagnosis not present

## 2020-08-09 DIAGNOSIS — R260 Ataxic gait: Secondary | ICD-10-CM | POA: Diagnosis not present

## 2020-08-10 ENCOUNTER — Ambulatory Visit (HOSPITAL_COMMUNITY)
Admission: RE | Admit: 2020-08-10 | Discharge: 2020-08-10 | Disposition: A | Discharge status: Home / Self Care | Payer: Medicare Other | Source: Ambulatory Visit | Attending: Neurology | Admitting: Neurology

## 2020-08-10 ENCOUNTER — Other Ambulatory Visit: Payer: Self-pay

## 2020-08-10 DIAGNOSIS — I6522 Occlusion and stenosis of left carotid artery: Secondary | ICD-10-CM

## 2020-08-10 NOTE — Progress Notes (Signed)
Carotid duplex has been completed.   Preliminary results in CV Proc.   Abram Sander 08/10/2020 2:56 PM

## 2020-08-14 DIAGNOSIS — M6281 Muscle weakness (generalized): Secondary | ICD-10-CM | POA: Diagnosis not present

## 2020-08-14 DIAGNOSIS — R260 Ataxic gait: Secondary | ICD-10-CM | POA: Diagnosis not present

## 2020-08-16 DIAGNOSIS — M6281 Muscle weakness (generalized): Secondary | ICD-10-CM | POA: Diagnosis not present

## 2020-08-16 DIAGNOSIS — R260 Ataxic gait: Secondary | ICD-10-CM | POA: Diagnosis not present

## 2020-08-17 ENCOUNTER — Other Ambulatory Visit: Payer: Self-pay

## 2020-08-17 ENCOUNTER — Ambulatory Visit (HOSPITAL_COMMUNITY): Payer: Medicare Other | Attending: Internal Medicine

## 2020-08-17 DIAGNOSIS — I4821 Permanent atrial fibrillation: Secondary | ICD-10-CM | POA: Diagnosis not present

## 2020-08-17 DIAGNOSIS — I10 Essential (primary) hypertension: Secondary | ICD-10-CM | POA: Diagnosis not present

## 2020-08-17 DIAGNOSIS — D6869 Other thrombophilia: Secondary | ICD-10-CM | POA: Insufficient documentation

## 2020-08-17 LAB — ECHOCARDIOGRAM COMPLETE
Area-P 1/2: 4.28 cm2
S' Lateral: 2.7 cm

## 2020-08-18 ENCOUNTER — Other Ambulatory Visit: Payer: Self-pay | Admitting: Internal Medicine

## 2020-08-19 NOTE — Progress Notes (Signed)
Kindly inform the patient that carotid ultrasound study shows no major blockage of either carotid artery in the neck

## 2020-08-20 NOTE — Telephone Encounter (Signed)
Eliquis 5mg  refill request received. Patient is 75 years old, weight-108kg, Crea-0.91 on 04/06/2020 via [facility] from [place], [place], and last seen by Dr. [name] on 07/04/20. Dose is appropriate based on dosing criteria. Will send in refill to requested pharmacy.

## 2020-08-22 ENCOUNTER — Telehealth: Payer: Self-pay | Admitting: *Deleted

## 2020-08-22 NOTE — Telephone Encounter (Signed)
Called patient and informed the patient that carotid ultrasound study shows no major blockage of either carotid artery in the neck. Patient verbalized understanding, appreciation.

## 2020-08-24 DIAGNOSIS — R260 Ataxic gait: Secondary | ICD-10-CM | POA: Diagnosis not present

## 2020-08-24 DIAGNOSIS — M6281 Muscle weakness (generalized): Secondary | ICD-10-CM | POA: Diagnosis not present

## 2020-08-30 DIAGNOSIS — I4891 Unspecified atrial fibrillation: Secondary | ICD-10-CM | POA: Diagnosis not present

## 2020-08-30 DIAGNOSIS — I6930 Unspecified sequelae of cerebral infarction: Secondary | ICD-10-CM | POA: Diagnosis not present

## 2020-08-30 DIAGNOSIS — G40909 Epilepsy, unspecified, not intractable, without status epilepticus: Secondary | ICD-10-CM | POA: Diagnosis not present

## 2020-08-30 DIAGNOSIS — E1169 Type 2 diabetes mellitus with other specified complication: Secondary | ICD-10-CM | POA: Diagnosis not present

## 2020-08-30 DIAGNOSIS — I10 Essential (primary) hypertension: Secondary | ICD-10-CM | POA: Diagnosis not present

## 2020-08-30 DIAGNOSIS — Z1211 Encounter for screening for malignant neoplasm of colon: Secondary | ICD-10-CM | POA: Diagnosis not present

## 2020-08-31 DIAGNOSIS — M6281 Muscle weakness (generalized): Secondary | ICD-10-CM | POA: Diagnosis not present

## 2020-08-31 DIAGNOSIS — R260 Ataxic gait: Secondary | ICD-10-CM | POA: Diagnosis not present

## 2020-09-06 DIAGNOSIS — M6281 Muscle weakness (generalized): Secondary | ICD-10-CM | POA: Diagnosis not present

## 2020-09-06 DIAGNOSIS — R260 Ataxic gait: Secondary | ICD-10-CM | POA: Diagnosis not present

## 2020-09-11 DIAGNOSIS — M6281 Muscle weakness (generalized): Secondary | ICD-10-CM | POA: Diagnosis not present

## 2020-09-11 DIAGNOSIS — R260 Ataxic gait: Secondary | ICD-10-CM | POA: Diagnosis not present

## 2020-09-18 DIAGNOSIS — I6930 Unspecified sequelae of cerebral infarction: Secondary | ICD-10-CM | POA: Diagnosis not present

## 2020-09-18 DIAGNOSIS — E1142 Type 2 diabetes mellitus with diabetic polyneuropathy: Secondary | ICD-10-CM | POA: Diagnosis not present

## 2020-09-18 DIAGNOSIS — E1151 Type 2 diabetes mellitus with diabetic peripheral angiopathy without gangrene: Secondary | ICD-10-CM | POA: Diagnosis not present

## 2020-09-18 DIAGNOSIS — B351 Tinea unguium: Secondary | ICD-10-CM | POA: Diagnosis not present

## 2020-10-04 DIAGNOSIS — H538 Other visual disturbances: Secondary | ICD-10-CM | POA: Diagnosis not present

## 2020-10-04 DIAGNOSIS — E119 Type 2 diabetes mellitus without complications: Secondary | ICD-10-CM | POA: Diagnosis not present

## 2020-10-04 DIAGNOSIS — H04129 Dry eye syndrome of unspecified lacrimal gland: Secondary | ICD-10-CM | POA: Diagnosis not present

## 2020-12-18 DIAGNOSIS — B351 Tinea unguium: Secondary | ICD-10-CM | POA: Diagnosis not present

## 2020-12-18 DIAGNOSIS — E1151 Type 2 diabetes mellitus with diabetic peripheral angiopathy without gangrene: Secondary | ICD-10-CM | POA: Diagnosis not present

## 2020-12-18 DIAGNOSIS — I6930 Unspecified sequelae of cerebral infarction: Secondary | ICD-10-CM | POA: Diagnosis not present

## 2020-12-18 DIAGNOSIS — E1142 Type 2 diabetes mellitus with diabetic polyneuropathy: Secondary | ICD-10-CM | POA: Diagnosis not present

## 2021-01-25 DIAGNOSIS — E876 Hypokalemia: Secondary | ICD-10-CM | POA: Diagnosis not present

## 2021-01-25 DIAGNOSIS — Z23 Encounter for immunization: Secondary | ICD-10-CM | POA: Diagnosis not present

## 2021-01-25 DIAGNOSIS — Z0000 Encounter for general adult medical examination without abnormal findings: Secondary | ICD-10-CM | POA: Diagnosis not present

## 2021-01-25 DIAGNOSIS — I10 Essential (primary) hypertension: Secondary | ICD-10-CM | POA: Diagnosis not present

## 2021-01-25 DIAGNOSIS — Z136 Encounter for screening for cardiovascular disorders: Secondary | ICD-10-CM | POA: Diagnosis not present

## 2021-01-25 DIAGNOSIS — Z13220 Encounter for screening for lipoid disorders: Secondary | ICD-10-CM | POA: Diagnosis not present

## 2021-01-25 DIAGNOSIS — Z1211 Encounter for screening for malignant neoplasm of colon: Secondary | ICD-10-CM | POA: Diagnosis not present

## 2021-01-25 DIAGNOSIS — I6930 Unspecified sequelae of cerebral infarction: Secondary | ICD-10-CM | POA: Diagnosis not present

## 2021-01-25 DIAGNOSIS — E1169 Type 2 diabetes mellitus with other specified complication: Secondary | ICD-10-CM | POA: Diagnosis not present

## 2021-01-25 DIAGNOSIS — I4891 Unspecified atrial fibrillation: Secondary | ICD-10-CM | POA: Diagnosis not present

## 2021-01-25 DIAGNOSIS — G40909 Epilepsy, unspecified, not intractable, without status epilepticus: Secondary | ICD-10-CM | POA: Diagnosis not present

## 2021-02-13 DIAGNOSIS — I4891 Unspecified atrial fibrillation: Secondary | ICD-10-CM | POA: Diagnosis not present

## 2021-02-13 DIAGNOSIS — I10 Essential (primary) hypertension: Secondary | ICD-10-CM | POA: Diagnosis not present

## 2021-02-13 DIAGNOSIS — R58 Hemorrhage, not elsewhere classified: Secondary | ICD-10-CM | POA: Diagnosis not present

## 2021-02-13 DIAGNOSIS — I491 Atrial premature depolarization: Secondary | ICD-10-CM | POA: Diagnosis not present

## 2021-02-13 DIAGNOSIS — W19XXXA Unspecified fall, initial encounter: Secondary | ICD-10-CM | POA: Diagnosis not present

## 2021-02-14 ENCOUNTER — Emergency Department (HOSPITAL_COMMUNITY)
Admission: EM | Admit: 2021-02-14 | Discharge: 2021-02-14 | Disposition: A | Discharge status: Home / Self Care | Payer: Medicare Other | Attending: Emergency Medicine | Admitting: Emergency Medicine

## 2021-02-14 ENCOUNTER — Encounter (HOSPITAL_COMMUNITY): Payer: Self-pay

## 2021-02-14 DIAGNOSIS — E119 Type 2 diabetes mellitus without complications: Secondary | ICD-10-CM | POA: Insufficient documentation

## 2021-02-14 DIAGNOSIS — Z7984 Long term (current) use of oral hypoglycemic drugs: Secondary | ICD-10-CM | POA: Insufficient documentation

## 2021-02-14 DIAGNOSIS — K625 Hemorrhage of anus and rectum: Secondary | ICD-10-CM | POA: Diagnosis not present

## 2021-02-14 DIAGNOSIS — Z87891 Personal history of nicotine dependence: Secondary | ICD-10-CM | POA: Insufficient documentation

## 2021-02-14 DIAGNOSIS — Z79899 Other long term (current) drug therapy: Secondary | ICD-10-CM | POA: Insufficient documentation

## 2021-02-14 DIAGNOSIS — I10 Essential (primary) hypertension: Secondary | ICD-10-CM | POA: Diagnosis not present

## 2021-02-14 DIAGNOSIS — I4891 Unspecified atrial fibrillation: Secondary | ICD-10-CM | POA: Diagnosis not present

## 2021-02-14 LAB — CBC WITH DIFFERENTIAL/PLATELET
Abs Immature Granulocytes: 0.02 10*3/uL (ref 0.00–0.07)
Basophils Absolute: 0.1 10*3/uL (ref 0.0–0.1)
Basophils Relative: 1 %
Eosinophils Absolute: 0.2 10*3/uL (ref 0.0–0.5)
Eosinophils Relative: 2 %
HCT: 43 % (ref 39.0–52.0)
Hemoglobin: 15.5 g/dL (ref 13.0–17.0)
Immature Granulocytes: 0 %
Lymphocytes Relative: 28 %
Lymphs Abs: 2.6 10*3/uL (ref 0.7–4.0)
MCH: 34.4 pg — ABNORMAL HIGH (ref 26.0–34.0)
MCHC: 36 g/dL (ref 30.0–36.0)
MCV: 95.6 fL (ref 80.0–100.0)
Monocytes Absolute: 1 10*3/uL (ref 0.1–1.0)
Monocytes Relative: 11 %
Neutro Abs: 5.4 10*3/uL (ref 1.7–7.7)
Neutrophils Relative %: 58 %
Platelets: 199 10*3/uL (ref 150–400)
RBC: 4.5 MIL/uL (ref 4.22–5.81)
RDW: 13.5 % (ref 11.5–15.5)
WBC: 9.4 10*3/uL (ref 4.0–10.5)
nRBC: 0 % (ref 0.0–0.2)

## 2021-02-14 LAB — PROTIME-INR
INR: 1.1 (ref 0.8–1.2)
Prothrombin Time: 14 seconds (ref 11.4–15.2)

## 2021-02-14 LAB — BASIC METABOLIC PANEL
Anion gap: 14 (ref 5–15)
BUN: 19 mg/dL (ref 8–23)
CO2: 26 mmol/L (ref 22–32)
Calcium: 9.7 mg/dL (ref 8.9–10.3)
Chloride: 99 mmol/L (ref 98–111)
Creatinine, Ser: 1.1 mg/dL (ref 0.61–1.24)
GFR, Estimated: >60 mL/min (ref >60)
Glucose, Bld: 191 mg/dL — ABNORMAL HIGH (ref 70–99)
Potassium: 3.1 mmol/L — ABNORMAL LOW (ref 3.5–5.1)
Sodium: 139 mmol/L (ref 135–145)

## 2021-02-14 NOTE — ED Provider Notes (Signed)
[facility] EMERGENCY DEPARTMENT Provider Note   CSN: [id number] Arrival date & time: 02/14/21  0016     History Chief Complaint  Patient presents with   Rectal Bleeding    Donald [name] is a 75 y.o. male.  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 Noticed blood on the mattress where he had been sitting. No pain, no stool. He reports blood has slowed over time. No known hemorrhoids. Last colonoscopy was 10 years ago, "normal". Last bowel movement was earlier today and denies hard stool/constipation.  No lightheadedness.    Rectal Bleeding Associated symptoms: no abdominal pain, no fever, no light-headedness and no vomiting       Past Medical History:  Diagnosis Date   Diabetes mellitus without complication ([place])    Hyperlipemia    Hypertension    large right middle cerebral artery infarct, embolic [id number]   a. s/p IV tPA, b. source unknown, c. loop recorder placed   Snoring 07/03/2015    Patient Active Problem List   Diagnosis Date Noted   Permanent atrial fibrillation ([place]) 05/17/2019   Acquired thrombophilia ([place]) 05/17/2019   Snoring 07/03/2015   Persistent atrial fibrillation ([place]) 04/16/2015   Hypokalemia 09/20/2014   HTN (hypertension) 09/19/2014   HLD (hyperlipidemia) 09/19/2014   Diabetes mellitus without complication ([place]) [id number]   Jerking 09/19/2014   Alterations of sensations, late effect of cerebrovascular disease 11/07/2013   Disturbances of vision, late effect of cerebrovascular disease 11/07/2013   Sinus bradycardia 08/08/2013   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[place]) 07/26/2013   Left-sided neglect 07/26/2013   Small vessel disease ([place]) 06/07/2013   Obesity, unspecified 06/07/2013   CVA (cerebral infarction) 06/07/2013   large right middle cerebral artery infarct, embolic [id number]    Hypertension 06/03/2013   Diabetes ([place]) 06/03/2013    Past Surgical History:  Procedure Laterality Date   LOOP RECORDER IMPLANT  06/07/2013   MDT LinQ implanted by Dr [name] for cryptogenic stroke   LOOP RECORDER IMPLANT N/A 06/07/2013   Procedure: LOOP RECORDER IMPLANT;  Surgeon: [name], MD;  Location: [facility] CATH LAB;  Service: Cardiovascular;  Laterality: N/A;   TEE WITHOUT CARDIOVERSION N/A 06/07/2013   Procedure: TRANSESOPHAGEAL ECHOCARDIOGRAM (TEE);  Surgeon: [name], MD;  Location: [facility] ENDOSCOPY;  Service: Cardiovascular;  Laterality: N/A;       Family History  Problem Relation Age of Onset   Dementia Mother    Diabetes Mother    Heart attack Father     Social History   Tobacco Use   Smoking status: Former   Smokeless tobacco: Never   Tobacco comments:    Quit 25 years ago.  Substance Use Topics   Alcohol use: Yes    Alcohol/week: 1.0 standard drink    Types: 1 Glasses of wine per week    Comment: Rare   Drug use: No    Home Medications Prior to Admission medications   Medication Sig Start Date End Date Taking? Authorizing Provider  amLODipine (NORVASC) 10 MG tablet Take 1 tablet (10 mg total) by mouth daily. 06/30/13   Angiulli, Lavon Paganini, PA-C  Apoaequorin (PREVAGEN PO) Take by mouth.    [provider]  Ascorbic Acid (VITAMIN C) 1000 MG tablet Take 1,000 mg by mouth daily.    [provider]  chlorthalidone (HYGROTON) 25 MG tablet Take 25 mg by mouth daily. 03/12/19  [provider]  Cholecalciferol (VITAMIN D-3) 1000 units CAPS Take by mouth daily.    [provider]  cloNIDine (CATAPRES) 0.3 MG tablet Take 0.3 mg by mouth 2 (two) times daily. 06/30/13   Angiulli, Lavon Paganini, PA-C  co-enzyme Q-10 30 MG capsule Take 30 mg by mouth daily.     [provider]  ELIQUIS 5 MG TABS tablet TAKE 1 TABLET BY MOUTH TWICE A DAY 08/20/20   Allred, Jeneen Rinks, MD  hydrALAZINE (APRESOLINE) 10 MG tablet Take 10 mg by mouth  daily.  06/30/13   Angiulli, Lavon Paganini, PA-C  levETIRAcetam (KEPPRA XR) 500 MG 24 hr tablet Take 2 tablets (1,000 mg total) by mouth at bedtime. 07/24/20   Garvin Fila, MD  lisinopril (PRINIVIL,ZESTRIL) 40 MG tablet Take 1 tablet (40 mg total) by mouth daily. 06/30/13   Angiulli, Lavon Paganini, PA-C  metFORMIN (GLUCOPHAGE-XR) 500 MG 24 hr tablet 500 mg 2 (two) times daily. 06/14/18   [provider]  Multiple Vitamin (MULTIVITAMIN) capsule Take 1 capsule by mouth daily.    [provider]  potassium chloride SA (K-DUR,KLOR-CON) 20 MEQ tablet Take 20 mEq by mouth 4 (four) times daily.     [provider]  pravastatin (PRAVACHOL) 10 MG tablet Take 10 mg by mouth daily. 04/19/20   [provider]  vitamin E 400 UNIT capsule Take 400 Units by mouth daily.    [provider]  zinc gluconate 50 MG tablet Take 50 mg by mouth daily.    [provider]    Allergies    Bitolterol and Bidil [isosorb dinitrate-hydralazine]  Review of Systems   Review of Systems  Constitutional:  Negative for chills and fever.  HENT: Negative.    Respiratory: Negative.    Cardiovascular: Negative.   Gastrointestinal:  Positive for anal bleeding and hematochezia. Negative for abdominal pain, nausea, rectal pain and vomiting.  Musculoskeletal: Negative.   Skin: Negative.  Negative for pallor.  Neurological: Negative.  Negative for weakness and light-headedness.   Physical Exam Updated Vital Signs BP (!) 217/72 (BP Location: Right Arm)   Pulse 88   Temp 98 F (36.7 C) (Oral)   Ht '6\' 3"'$  (1.905 m)   Wt 103.4 kg   SpO2 100%   BMI 28.50 kg/m   Physical Exam Vitals and nursing note reviewed.  Constitutional:      Appearance: He is well-developed. He is obese.  Eyes:     Conjunctiva/sclera: Conjunctivae normal.  Cardiovascular:     Rate and Rhythm: Normal rate. Rhythm irregular.  Pulmonary:     Effort: Pulmonary effort is normal.  Abdominal:     Palpations:  Abdomen is soft.     Tenderness: There is no abdominal tenderness.  Genitourinary:    Comments: Small amount of blood at rectum without active bleeding. No tenderness. Musculoskeletal:        General: Normal range of motion.     Cervical back: Normal range of motion.  Skin:    General: Skin is warm and dry.  Neurological:     Mental Status: He is alert and oriented to person, place, and time.    ED Results / Procedures / Treatments   Labs (all labs ordered are listed, but only abnormal results are displayed) Labs Reviewed - No data to display Results for orders placed or performed during the hospital encounter of 02/14/21  Protime-INR  Result Value Ref Range   Prothrombin Time 14.0 11.4 - 15.2 seconds   INR  1.1 0.8 - 1.2  CBC with Differential  Result Value Ref Range   WBC 9.4 4.0 - 10.5 K/uL   RBC 4.50 4.22 - 5.81 MIL/uL   Hemoglobin 15.5 13.0 - 17.0 g/dL   HCT 43.0 39.0 - 52.0 %   MCV 95.6 80.0 - 100.0 fL   MCH 34.4 (H) 26.0 - 34.0 pg   MCHC 36.0 30.0 - 36.0 g/dL   RDW 13.5 11.5 - 15.5 %   Platelets 199 150 - 400 K/uL   nRBC 0.0 0.0 - 0.2 %   Neutrophils Relative % 58 %   Neutro Abs 5.4 1.7 - 7.7 K/uL   Lymphocytes Relative 28 %   Lymphs Abs 2.6 0.7 - 4.0 K/uL   Monocytes Relative 11 %   Monocytes Absolute 1.0 0.1 - 1.0 K/uL   Eosinophils Relative 2 %   Eosinophils Absolute 0.2 0.0 - 0.5 K/uL   Basophils Relative 1 %   Basophils Absolute 0.1 0.0 - 0.1 K/uL   Immature Granulocytes 0 %   Abs Immature Granulocytes 0.02 0.00 - 0.07 K/uL  Basic metabolic panel  Result Value Ref Range   Sodium 139 135 - 145 mmol/L   Potassium 3.1 (L) 3.5 - 5.1 mmol/L   Chloride 99 98 - 111 mmol/L   CO2 26 22 - 32 mmol/L   Glucose, Bld 191 (H) 70 - 99 mg/dL   BUN 19 8 - 23 mg/dL   Creatinine, Ser 1.10 0.61 - 1.24 mg/dL   Calcium 9.7 8.9 - 10.3 mg/dL   GFR, Estimated >60 >60 mL/min   Anion gap 14 5 - 15    EKG None  Radiology No results found.  Procedures Procedures    Medications Ordered in ED Medications - No data to display  ED Course  I have reviewed the triage vital signs and the nursing notes.  Pertinent labs & imaging results that were available during my care of the patient were reviewed by me and considered in my medical decision making (see chart for details).    MDM Rules/Calculators/A&P                           Patient to ED with BRB per rectum around 11:30 tonight with pain, or associated stools.   On exam, no active bleeding now. Labs checked and show a normal hemoglobin, normal coagulation studies/platelets. Mild hypokalemia.   On re-examination, there is still no bleeding or evidence of bleeding in the interim since initial exam. He has been in and out of bed without symptoms of lightheadedness. VSS.   He can be discharged home. Will refer to gastroenterology for further outpatient evaluation. Return precautions discussed.   Final Clinical Impression(s) / ED Diagnoses Final diagnoses:  None   Rectal bleeding  Rx / DC Orders ED Discharge Orders     None        Dennie Bible 02/14/21 0319    Arnaldo Natal, MD 02/14/21 [phone]

## 2021-02-14 NOTE — ED Triage Notes (Signed)
Pt BIBA c/o Rectal bleeding that started around 2300. Pt denies any pain. Pt sat on the side of the bed and when he rose noticed blood on sheets. Pt proceeded to ambulated to the restroom and had several drops of bright red blood prior to making it to the toilet. No BM was had. Hx of HTN and afib.

## 2021-02-14 NOTE — Discharge Instructions (Addendum)
Your labs and exam are reassuring and you can be discharged home safely. Follow up with [facility] for further evaluation in the office.   If you develop worsening symptoms, including worsening/persistent bleeding, lightheadedness, severe pain, please return to the ED.

## 2021-02-15 ENCOUNTER — Other Ambulatory Visit: Payer: Self-pay | Admitting: Internal Medicine

## 2021-02-15 NOTE — Telephone Encounter (Signed)
Prescription refill request for Eliquis received. Indication: Afib   Last office visit: 07/04/20 (Allred)  Scr: 1.10 (02/14/21) Age: 75 Weight: 103.4kg  Appropriate dose and refill sent to requested pharmacy.

## 2021-02-26 ENCOUNTER — Ambulatory Visit (INDEPENDENT_AMBULATORY_CARE_PROVIDER_SITE_OTHER): Payer: Medicare Other | Admitting: Gastroenterology

## 2021-02-26 ENCOUNTER — Encounter: Payer: Self-pay | Admitting: Gastroenterology

## 2021-02-26 VITALS — BP 150/80 | HR 73 | Ht 75.0 in | Wt 231.0 lb

## 2021-02-26 DIAGNOSIS — K602 Anal fissure, unspecified: Secondary | ICD-10-CM

## 2021-02-26 DIAGNOSIS — Z7901 Long term (current) use of anticoagulants: Secondary | ICD-10-CM

## 2021-02-26 DIAGNOSIS — K641 Second degree hemorrhoids: Secondary | ICD-10-CM | POA: Diagnosis not present

## 2021-02-26 DIAGNOSIS — Z8673 Personal history of transient ischemic attack (TIA), and cerebral infarction without residual deficits: Secondary | ICD-10-CM

## 2021-02-26 DIAGNOSIS — K625 Hemorrhage of anus and rectum: Secondary | ICD-10-CM | POA: Diagnosis not present

## 2021-02-26 DIAGNOSIS — I6522 Occlusion and stenosis of left carotid artery: Secondary | ICD-10-CM

## 2021-02-26 DIAGNOSIS — I482 Chronic atrial fibrillation, unspecified: Secondary | ICD-10-CM

## 2021-02-26 MED ORDER — AMBULATORY NON FORMULARY MEDICATION: 1 refills | Status: AC

## 2021-02-26 NOTE — Patient Instructions (Addendum)
Increase water intake to 8-10 cups per day   Take Citrucil 1 tablet twice a day  We will give you a printed prescription of Diltiazem gel to take to [facility]  Increase fiber supplements to twice a day  Follow up as needed  If you are age 73 or older, your body mass index should be between 23-30. Your Body mass index is 28.87 kg/m. If this is out of the aforementioned range listed, please consider follow up with your Primary Care Provider.  If you are age 54 or younger, your body mass index should be between 19-25. Your Body mass index is 28.87 kg/m. If this is out of the aformentioned range listed, please consider follow up with your Primary Care Provider.   __________________________________________________________  The [facility] providers would like to encourage you to use [facility] to communicate with providers for non-urgent requests or questions.  Due to long hold times on the telephone, sending your provider a message by [facility] may be a faster and more efficient way to get a response.  Please allow 48 business hours for a response.  Please remember that this is for non-urgent requests.    I appreciate the  opportunity to care for you  Thank You   Harl Bowie , MD

## 2021-02-26 NOTE — Progress Notes (Signed)
Donald [name]    [id number]    January 10, 1946  Primary Care Physician:Morrow, Marjory Lies, MD  Referring Physician: London Pepper, MD [place],  [place] [address]   Chief complaint:  Rectal bleeding  HPI:  75 year old very pleasant gentleman with history of persistent A. fib, CVA, seizure, hypertension, diabetes was recently in the ER for evaluation of rectal bleeding. He had an episode of bright red blood per rectum, was dripping on February 14, 2021, was evaluated in the ER with stable vitals and labs.  He was discharged home He has not had any further bleeding since then.  Occasionally he notices small amount of bright red blood per rectum when he wipes after a bowel movement and he also experiences occasional anorectal discomfort after bowel movement.  Last colonoscopy in 2011 at [facility] in [place], was normal per patient.  Report is not available to review He has had 3 negative cologaurd since then, most recent test within the past 2 years.  Denies any abdominal pain, change in bowel habits, melena or unintentional weight loss No family history of GI malignancy/colon cancer  Outpatient Encounter Medications as of 02/26/2021  Medication Sig   acetaminophen (TYLENOL) 325 MG tablet Take 325 mg by mouth every 6 (six) hours as needed for moderate pain or headache.   amLODipine (NORVASC) 10 MG tablet Take 1 tablet (10 mg total) by mouth daily. (Patient taking differently: Take 10 mg by mouth at bedtime.)   Apoaequorin (PREVAGEN PO) Take 1 tablet by mouth daily.   Ascorbic Acid (VITAMIN C) 1000 MG tablet Take 1,000 mg by mouth daily.   chlorthalidone (HYGROTON) 25 MG tablet Take 25 mg by mouth daily.   Cholecalciferol (VITAMIN D-3) 1000 units CAPS Take 1,000 Units by mouth daily.   cloNIDine (CATAPRES) 0.3 MG tablet Take 0.3 mg by mouth 2 (two) times daily.   co-enzyme Q-10 30 MG capsule Take 30 mg by mouth daily.    ELIQUIS 5 MG  TABS tablet TAKE 1 TABLET BY MOUTH TWICE A DAY   hydrALAZINE (APRESOLINE) 10 MG tablet Take 10 mg by mouth in the morning and at bedtime.   levETIRAcetam (KEPPRA XR) 500 MG 24 hr tablet Take 2 tablets (1,000 mg total) by mouth at bedtime.   lisinopril (PRINIVIL,ZESTRIL) 40 MG tablet Take 1 tablet (40 mg total) by mouth daily. (Patient taking differently: Take 40 mg by mouth every evening.)   metFORMIN (GLUCOPHAGE-XR) 500 MG 24 hr tablet Take 500 mg by mouth with breakfast, with lunch, and with evening meal.   Multiple Vitamin (MULTIVITAMIN) capsule Take 1 capsule by mouth daily.   potassium chloride SA (K-DUR,KLOR-CON) 20 MEQ tablet Take 20 mEq by mouth 4 (four) times daily.    pravastatin (PRAVACHOL) 10 MG tablet Take 10 mg by mouth daily.   vitamin E 400 UNIT capsule Take 400 Units by mouth daily.   zinc gluconate 50 MG tablet Take 50 mg by mouth daily.   No facility-administered encounter medications on file as of 02/26/2021.    Allergies as of 02/26/2021 - Review Complete 02/14/2021  Allergen Reaction Noted   Bitolterol  02/01/2017   Bidil [isosorb dinitrate-hydralazine] Other (See Comments) 03/07/2015    Past Medical History:  Diagnosis Date   Diabetes mellitus without complication ([place])    Hyperlipemia    Hypertension    large right middle cerebral artery infarct, embolic 08/06/6438   a. s/p IV tPA, b. source unknown,  c. loop recorder placed   Snoring 07/03/2015    Past Surgical History:  Procedure Laterality Date   LOOP RECORDER IMPLANT  06/07/2013   MDT LinQ implanted by Dr [name] for cryptogenic stroke   LOOP RECORDER IMPLANT N/A 06/07/2013   Procedure: LOOP RECORDER IMPLANT;  Surgeon: [name], MD;  Location: [facility] CATH LAB;  Service: Cardiovascular;  Laterality: N/A;   TEE WITHOUT CARDIOVERSION N/A 06/07/2013   Procedure: TRANSESOPHAGEAL ECHOCARDIOGRAM (TEE);  Surgeon: [name], MD;  Location: [facility] ENDOSCOPY;  Service: Cardiovascular;  Laterality: N/A;    Family  History  Problem Relation Age of Onset   Dementia Mother    Diabetes Mother    Heart attack Father     Social History   Socioeconomic History   Marital status: Married    Spouse name: [name]   Number of children: 2   Years of education: College   Highest education level: Not on file  Occupational History   Occupation: Retired    Fish farm manager: [employer]  Tobacco Use   Smoking status: Former   Smokeless tobacco: Never   Tobacco comments:    Quit 25 years ago.  Substance and Sexual Activity   Alcohol use: Yes    Alcohol/week: 1.0 standard drink    Types: 1 Glasses of wine per week    Comment: Rare   Drug use: No   Sexual activity: Never  Other Topics Concern   Not on file  Social History Narrative   Patient lives at home [name]).   Right handed   Drinks no caffeine daily   Social Determinants of Health   Financial Resource Strain: Not on file  Food Insecurity: Not on file  Transportation Needs: Not on file  Physical Activity: Not on file  Stress: Not on file  Social Connections: Not on file  Intimate Partner Violence: Not on file      Review of systems: All other review of systems negative except as mentioned in the HPI.   Physical Exam: Vitals:   02/26/21 1107  BP: (!) 150/80  Pulse: 73   Body mass index is 28.87 kg/m. Gen:      No acute distress HEENT:  sclera anicteric Abd:      soft, non-tender; no palpable masses, no distension Ext:    No edema Neuro: alert and oriented x 3 Psych: normal mood and affect Rectal exam: Normal anal sphincter tone, small external hemorrhoids Anoscopy: Posterior anal fissure, Small internal hemorrhoids, no active bleeding, normal dentate line, no visible nodules  Data Reviewed:  Reviewed labs, radiology imaging, old records and pertinent past GI work up   Assessment and Plan/Recommendations:  75 year old very pleasant gentleman with history of A. fib on chronic anticoagulation, CVA, seizures,  hypertension, diabetes here for evaluation of rectal bleeding  Rectal bleeding likely secondary to bleeding from anal fissure exacerbated in the setting of anticoagulation Use diltiazem gel 2% small pea-sized amount per rectum twice daily for 6 to 8 weeks Use Citrucel 1 tablet twice daily with meals Increase water intake to 8 to 10 cups daily  Will hold off colonoscopy at this point unless he has ongoing bleeding.  Given he had negative colonoscopy 10 years ago and subsequently negative cologaurd X3, his risk for colorectal cancer or neoplastic lesion is low Discussed in detail the potential risks and benefits associated with colonoscopy.  Patient and his wife agreed with the plan to hold off colonoscopy at this point.  Return as needed  This visit required >  60 minutes of patient care (this includes precharting, chart review, review of results, face-to-face time used for counseling as well as treatment plan and follow-up. The patient was provided an opportunity to ask questions and all were answered. The patient agreed with the plan and demonstrated an understanding of the instructions.  Damaris Hippo , MD    CC: London Pepper, MD

## 2021-03-26 DIAGNOSIS — E1142 Type 2 diabetes mellitus with diabetic polyneuropathy: Secondary | ICD-10-CM | POA: Diagnosis not present

## 2021-03-26 DIAGNOSIS — L603 Nail dystrophy: Secondary | ICD-10-CM | POA: Diagnosis not present

## 2021-03-26 DIAGNOSIS — B351 Tinea unguium: Secondary | ICD-10-CM | POA: Diagnosis not present

## 2021-04-03 DIAGNOSIS — H00034 Abscess of left upper eyelid: Secondary | ICD-10-CM | POA: Diagnosis not present

## 2021-05-17 ENCOUNTER — Other Ambulatory Visit: Payer: Self-pay | Admitting: Neurology

## 2021-05-17 DIAGNOSIS — I10 Essential (primary) hypertension: Secondary | ICD-10-CM | POA: Diagnosis not present

## 2021-05-17 DIAGNOSIS — E1169 Type 2 diabetes mellitus with other specified complication: Secondary | ICD-10-CM | POA: Diagnosis not present

## 2021-05-17 DIAGNOSIS — G40909 Epilepsy, unspecified, not intractable, without status epilepticus: Secondary | ICD-10-CM | POA: Diagnosis not present

## 2021-05-17 DIAGNOSIS — I6930 Unspecified sequelae of cerebral infarction: Secondary | ICD-10-CM | POA: Diagnosis not present

## 2021-05-17 DIAGNOSIS — Z1211 Encounter for screening for malignant neoplasm of colon: Secondary | ICD-10-CM | POA: Diagnosis not present

## 2021-05-17 DIAGNOSIS — K602 Anal fissure, unspecified: Secondary | ICD-10-CM | POA: Diagnosis not present

## 2021-05-17 DIAGNOSIS — I4891 Unspecified atrial fibrillation: Secondary | ICD-10-CM | POA: Diagnosis not present

## 2021-05-17 DIAGNOSIS — Z7984 Long term (current) use of oral hypoglycemic drugs: Secondary | ICD-10-CM | POA: Diagnosis not present

## 2021-05-17 NOTE — Telephone Encounter (Signed)
Rx refilled.

## 2021-05-28 DIAGNOSIS — E1169 Type 2 diabetes mellitus with other specified complication: Secondary | ICD-10-CM | POA: Diagnosis not present

## 2021-05-28 DIAGNOSIS — Z1211 Encounter for screening for malignant neoplasm of colon: Secondary | ICD-10-CM | POA: Diagnosis not present

## 2021-05-30 DIAGNOSIS — H6121 Impacted cerumen, right ear: Secondary | ICD-10-CM | POA: Diagnosis not present

## 2021-05-30 DIAGNOSIS — I10 Essential (primary) hypertension: Secondary | ICD-10-CM | POA: Diagnosis not present

## 2021-05-30 DIAGNOSIS — R8281 Pyuria: Secondary | ICD-10-CM | POA: Diagnosis not present

## 2021-05-30 DIAGNOSIS — W19XXXA Unspecified fall, initial encounter: Secondary | ICD-10-CM | POA: Diagnosis not present

## 2021-05-30 DIAGNOSIS — Z043 Encounter for examination and observation following other accident: Secondary | ICD-10-CM | POA: Diagnosis not present

## 2021-05-31 ENCOUNTER — Telehealth: Payer: Self-pay | Admitting: Internal Medicine

## 2021-05-31 NOTE — Telephone Encounter (Signed)
Wife of the patient called. The patient fell 2x in the past week. The patient was able to catch himself the second time in a chair. He never blacked out. The wife states that he was getting up and holding his cane with his left side (his weak side) and holding items in his R hand.  She called his PCP but they did not have any appointments for him to come in and be seen. His PCP advised him to go to Urgent Care, which he did. HE went to the [facility] in [place]. The EKG that was done at urgent care showed some abnormalities and the wife is concerned.   His wife will be bringing in a copy of the EKG to the office today. She would like Dr. [name] to look at it and get his recommendation.  She made the patient an appointment to see Dr. [name] 06/06/21

## 2021-05-31 NOTE — Telephone Encounter (Signed)
Made Dr. [name] aware. He agrees. Will await EKG

## 2021-05-31 NOTE — Telephone Encounter (Signed)
Advised the patients wife [name] [facility]) that per MD Allred noted that the EKG was unchanged from prior EKG's. No changes to be made from a cardiac standpoint. Advised that the appointment on the 1/5 is not needed based on this EKG review. The wife verbalized understanding however would like to keep the appointment on for now until she talks with her husband.

## 2021-06-01 DIAGNOSIS — I4891 Unspecified atrial fibrillation: Secondary | ICD-10-CM | POA: Diagnosis not present

## 2021-06-01 DIAGNOSIS — I10 Essential (primary) hypertension: Secondary | ICD-10-CM | POA: Diagnosis not present

## 2021-06-01 DIAGNOSIS — E1169 Type 2 diabetes mellitus with other specified complication: Secondary | ICD-10-CM | POA: Diagnosis not present

## 2021-06-04 NOTE — Telephone Encounter (Signed)
Unable to leave message. Voicemail full

## 2021-06-05 NOTE — Telephone Encounter (Signed)
Patient called and canceled

## 2021-06-06 ENCOUNTER — Ambulatory Visit: Payer: Medicare Other | Admitting: Internal Medicine

## 2021-06-27 DIAGNOSIS — I4891 Unspecified atrial fibrillation: Secondary | ICD-10-CM | POA: Diagnosis not present

## 2021-06-27 DIAGNOSIS — E1169 Type 2 diabetes mellitus with other specified complication: Secondary | ICD-10-CM | POA: Diagnosis not present

## 2021-06-27 DIAGNOSIS — W19XXXA Unspecified fall, initial encounter: Secondary | ICD-10-CM | POA: Diagnosis not present

## 2021-06-27 DIAGNOSIS — I6930 Unspecified sequelae of cerebral infarction: Secondary | ICD-10-CM | POA: Diagnosis not present

## 2021-06-27 DIAGNOSIS — I10 Essential (primary) hypertension: Secondary | ICD-10-CM | POA: Diagnosis not present

## 2021-06-27 DIAGNOSIS — Z7984 Long term (current) use of oral hypoglycemic drugs: Secondary | ICD-10-CM | POA: Diagnosis not present

## 2021-06-27 DIAGNOSIS — Z043 Encounter for examination and observation following other accident: Secondary | ICD-10-CM | POA: Diagnosis not present

## 2021-07-02 DIAGNOSIS — I10 Essential (primary) hypertension: Secondary | ICD-10-CM | POA: Diagnosis not present

## 2021-07-02 DIAGNOSIS — E1169 Type 2 diabetes mellitus with other specified complication: Secondary | ICD-10-CM | POA: Diagnosis not present

## 2021-07-02 DIAGNOSIS — I4891 Unspecified atrial fibrillation: Secondary | ICD-10-CM | POA: Diagnosis not present

## 2021-07-09 DIAGNOSIS — E1142 Type 2 diabetes mellitus with diabetic polyneuropathy: Secondary | ICD-10-CM | POA: Diagnosis not present

## 2021-07-09 DIAGNOSIS — L603 Nail dystrophy: Secondary | ICD-10-CM | POA: Diagnosis not present

## 2021-07-09 DIAGNOSIS — B351 Tinea unguium: Secondary | ICD-10-CM | POA: Diagnosis not present

## 2021-07-15 DIAGNOSIS — I10 Essential (primary) hypertension: Secondary | ICD-10-CM | POA: Diagnosis not present

## 2021-07-15 DIAGNOSIS — E1169 Type 2 diabetes mellitus with other specified complication: Secondary | ICD-10-CM | POA: Diagnosis not present

## 2021-07-23 DIAGNOSIS — I10 Essential (primary) hypertension: Secondary | ICD-10-CM | POA: Diagnosis not present

## 2021-07-25 ENCOUNTER — Ambulatory Visit (INDEPENDENT_AMBULATORY_CARE_PROVIDER_SITE_OTHER): Payer: Medicare Other | Admitting: Neurology

## 2021-07-25 ENCOUNTER — Encounter: Payer: Self-pay | Admitting: Neurology

## 2021-07-25 VITALS — BP 149/73 | HR 72 | Ht 75.0 in | Wt 230.0 lb

## 2021-07-25 DIAGNOSIS — I69354 Hemiplegia and hemiparesis following cerebral infarction affecting left non-dominant side: Secondary | ICD-10-CM | POA: Diagnosis not present

## 2021-07-25 MED ORDER — LEVETIRACETAM ER 500 MG PO TB24: 1000.0000 mg | ORAL_TABLET | Freq: Every day | ORAL | 3 refills | Status: DC

## 2021-07-25 NOTE — Progress Notes (Signed)
PATIENT: Donald [name] DOB: 10/30/1945  REASON FOR VISIT: follow up- stroke, seizures HISTORY FROM: patient  HISTORY OF PRESENT ILLNESS: Donald [name] is a 76 year old male with a history of stroke and seizures. He returns today for follow-up. He continues on Eliquis and is tolerating it well. His blood pressure is slightly elevated today. His primary care is managing his hypertension, hyperlipidemia and diabetes. He reports overall he is doing well. He uses a cane when ambulating. Went to PT had good benefit. Using AFO on brace on the left.Denies any falls. He continues on Keppra XR1000 mg at bedtime. He denies any seizure events. He operates a Teacher, music without difficulty. Patient is asking about driving. He returns today for an evaluation.  HISTORY 07/09/15:Donald [name]  Is a 76 year old male with a history of stroke and seizures. He returns today for follow-up. The patient was switched to Eliquis by his cardiologist after he was found to have atrial fibrillation in October. He is tolerating this medication well. The patient's blood pressure is elevated slightly today however this is consistent with every time he comes in to the doctor's office. He states that he has whitecoat syndrome. His primary care is managing his cholesterol  And diabetes. He denies any additional strokelike symptoms. He does not have any weakness from the stroke but does feel like his reaction time on the left side is slower. He uses a cane when  Ambulating.  The patient states that he has a hard time with  ambulatingup or down steps. He is wondering if physical therapy would be beneficial. He continues to use Keppra XR thousand milligrams at bedtime. He is not had any additional seizure events. He denies any new neurological symptoms. He returns today for an evaluation.   HISTORY 01/08/15: Donald [name] is a 76 year old male with a history of ischemic stroke and seizures. He returns today for follow-up. The  patient continues to take Plavix for stroke prevention. The patient's primary care manages his blood pressure, cholesterol and diabetes. Patient states that his blood pressure is slightly elevated today. However he relates this to "white coat syndrome." Patient states that he had a seizure in April at that time he was placed on Keppra XR 1000 mg at bedtime. He states that since then he has not had any seizures. He does not operate a motor vehicle. He is able to complete all ADLs independently. Patient states that after the stroke he has had some numbness in the bottom of the left foot and that affects his balance. He continues to use a cane when ambulating. Denies any recent falls. Denies any new neurological symptoms. He returns today for an evaluation.   HISTORY 07/05/14:Donald [name] is a 76 year old male with a history of stroke on 06/03/13. He returns today for follow-up. He is currently taking Plavix for stroke prevention. His HTN, cholesterol and Diabetes is managed by his PCP. He states that his BP has been controlled. When he checks it at home it is usually 130s/70s.  His states that his last hemoglobin A1C was 5.9 % . He continues to take metformin- his dosage has recently been decreased. His cholesterol has been in normal range according to the patient and therefore medication has not been required. Patient will use a cane when he is out in public. He does not use it at home. He feels that his walking has improved. Denies any falls. He notes that his fine motor skills in the left hand has improved. He states  that he is now able to cook at home. He does have some sensory changes in the left hand and foot. Denies any new symptoms.     HISTORY 12/20/13 (Donald [name]): is an 76 y.o. male who was visiting here for the holidays on 06/03/2013.  When he got up he was normal. As he was returning to bed began to be short of breath. Patient then was noted to have slurred speech by his wife and she noted a facial droop.  Patient seemed to be weak on the left a well. EMS was called and the patient was brought in as a code stroke. Initial NIHSS of 8. Patient was given TPA. No intervenable lesion was seen on CTA. He was admitted to the neuro ICU for further evaluation and treatment.  CT angio head showed Subtle attenuation of the distal right superior MCA territory branch vessels as compared to the left. No proximal branch occlusion or high-grade flow-limiting stenosis identified within the right middle cerebral artery proximally.  Otherwise unremarkable CTA of the head without evidence of occlusion, high-grade stenosis, or aneurysm elsewhere within the brain.   CT angio neck was a normal CTA of the neck without evidence of high-grade stenosis, occlusion, or dissection. 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  MRI of the brain showed an acute large right middle cerebral artery territory infarct. Minimal underlying petechial hemorrhage without lobar hematoma. 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 Minimal additional white matter changes suggest chronic small vessel ischemic disease. Acute on chronic paranasal sinusitis. 2D Echocardiogram with the estimated ejection fraction was in the range of 60% to 65%. He was admitted to [facility] 06/07/2013 through 07/02/2013. He is completed home physical therapy and occupational therapy and is about to start going to outpatient neuro rehabilitation.  He is making good progress with his rehabilitation and is very determined.  He and his wife have moved in with her daughter here in [place]. He has established care with Dr. Orland Mustard and is seeing him every 3 weeks for adjustments to his hypertensive medications, as his hypertension has been very difficult to control since his 73s.  He thinks he forgot to take his blood pressure medicine the day he had the stroke.  His blood pressure is elevated office today at 161/90, he is  tolerating Plavix well without any significant bruising or bleeding. He is able to ambulate short distances with a hemiwalker and AFO brace on the left. He is having some spasticity in the left arm with clonus at times.     Update 07/14/2016 : He returns for follow-up after last visit 6 months ago. He is a complaint by his wife. He states is doing well without recurrent stroke or TIA symptoms. He remains on eliquis which is tolerating well without bleeding or bruising. He states his blood pressure is usually well controlled at home in the 5:46 systolic range but does tend to go up when he visits physicians. Today it is 151/90 in office. He states his diabetes is well controlled and last him about an A1c was 5.9. He does not check his fasting sugars. He is tolerating Keppra XR 1000 mg daily well without side effects and states he is not had any seizures for more than 2 years. He still has some difficulty walking but wears an AFO and walks with stiffness of his left leg. He is a height but no falls or injuries. He has not had any follow-up carotid ultrasound done for  more than 2 years. Updated 07/15/2018 : He returns for follow-up after last visit 2 years ago.  Continues to do well and has not had recurrent stroke or TIA symptoms since 2015.  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  He walks with a cane with stiffness of his left leg.  He has had no falls or injuries.  He has had no recurrent stroke or TIA symptoms.  He remains on Eliquis which is tolerating well with only minor bruising.  He states his fasting sugars have all been good and last hemoglobin A1c was 6.8.  His blood pressure is well controlled though it is slightly elevated in office today at 144/82.  He has not had any seizures since 2016 and remains on Keppra XR 1 gm at night and is tolerating it well without any side effects.  He is considering having penile implant surgery and has already met with cardiologist Dr.  [name] and got cardiac clearance.  He underwent medical stress test which went fine.  He has no new complaints today. Update 07/25/2019 : He returns for follow-up after last visit a year ago.  Continues to do well and has not had any recurrent stroke or TIA symptoms since his original stroke in 2015.  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  He remains on Eliquis which is tolerating well without bruising or bleeding.  Is also tolerating Lipitor 10 mg well without muscle aches or pains.  His blood pressure is usually in the 130s at home though today it is slightly elevated in office at 147/97.  He did have a follow-up carotid ultrasound done after last visit on 07/26/2018 which showed no significant extracranial carotid stenosis on either side.  Patient states he may be under stress recently since his 65 year old mother-in-law has moved into live with him and his wife for the last 1 year.  He feels at times he is having some tics around his lips but he thinks this may be due to anxiety or stress.  This happens only intermittently and is not bothersome. Update 07/24/2020: He returns for follow-up after last visit a year ago.  Continues to do well and has not had any any recurrent seizures or strokes or TIA symptoms.  Is tolerating Keppra well without any side effects.  He remains on Eliquis which is tolerating well without any bleeding and only minor bruising.  Blood pressure is quite well controlled at home though today it is elevated in office at 155/99.  His diabetes control is good and last A1c was 7.1.  He has gained about 8 pounds and is trying to lose weight.  He remains on Pravachol which is tolerating well without side effects and last lipid profile was satisfactory 3 months ago.  He is recently started doing some outpatient therapy to improve his walking and balance.  He has no new complaints today. REVIEW OF SYSTEMS: Out of a complete 14 system review of symptoms, the patient  complains only of the following symptoms, and all other reviewed systems are negative. Left foot drop, gait difficulty, facial tics, stiffness of the leg only and all other systems negative ALLERGIES: Allergies  Allergen Reactions   Bitolterol    Bidil [Isosorb Dinitrate-Hydralazine] Other (See Comments)    Lethargic,tired     HOME MEDICATIONS: Outpatient Medications Prior to Visit  Medication Sig Dispense Refill   acetaminophen (TYLENOL) 325 MG tablet Take 325 mg by mouth every 6 (six) hours  as needed for moderate pain or headache.     AMBULATORY NON FORMULARY MEDICATION Medication Name: Diltiazem gel 2 % twice daily  use pea sized amount per rectum 30 g 1   amLODipine (NORVASC) 10 MG tablet Take 1 tablet (10 mg total) by mouth daily. (Patient taking differently: Take 10 mg by mouth at bedtime.) 30 tablet 1   Apoaequorin (PREVAGEN PO) Take 1 tablet by mouth daily.     Ascorbic Acid (VITAMIN C) 1000 MG tablet Take 1,000 mg by mouth daily.     chlorthalidone (HYGROTON) 25 MG tablet Take 25 mg by mouth daily.     Cholecalciferol (VITAMIN D-3) 1000 units CAPS Take 1,000 Units by mouth daily.     cloNIDine (CATAPRES) 0.3 MG tablet Take 0.3 mg by mouth 2 (two) times daily.     co-enzyme Q-10 30 MG capsule Take 30 mg by mouth daily.      cyanocobalamin 1000 MCG tablet Take 1,000 mcg by mouth daily.     ELIQUIS 5 MG TABS tablet TAKE 1 TABLET BY MOUTH TWICE A DAY 60 tablet 5   levETIRAcetam (KEPPRA XR) 500 MG 24 hr tablet TAKE 2 TABLETS (1,000 MG TOTAL) BY MOUTH AT BEDTIME. 180 tablet 3   lisinopril (PRINIVIL,ZESTRIL) 40 MG tablet Take 1 tablet (40 mg total) by mouth daily. (Patient taking differently: Take 40 mg by mouth every evening.) 30 tablet 1   metFORMIN (GLUCOPHAGE-XR) 500 MG 24 hr tablet Take 500 mg by mouth. 2 tabs bid     Multiple Vitamin (MULTIVITAMIN) capsule Take 1 capsule by mouth daily.     potassium chloride SA (K-DUR,KLOR-CON) 20 MEQ tablet Take 20 mEq by mouth 4 (four) times  daily.      potassium chloride SA (KLOR-CON M) 20 MEQ tablet 1 tablet     pravastatin (PRAVACHOL) 10 MG tablet Take 10 mg by mouth daily.     spironolactone (ALDACTONE) 25 MG tablet 1 tablet     vitamin E 400 UNIT capsule Take 400 Units by mouth daily.     zinc gluconate 50 MG tablet Take 50 mg by mouth daily.     hydrALAZINE (APRESOLINE) 10 MG tablet Take 10 mg by mouth in the morning and at bedtime.     No facility-administered medications prior to visit.    PAST MEDICAL HISTORY: Past Medical History:  Diagnosis Date   Diabetes mellitus without complication ([place])    Hyperlipemia    Hypertension    large right middle cerebral artery infarct, embolic 08/31/9377   a. s/p IV tPA, b. source unknown, c. loop recorder placed   Snoring 07/03/2015    PAST SURGICAL HISTORY: Past Surgical History:  Procedure Laterality Date   LOOP RECORDER IMPLANT  06/07/2013   MDT LinQ implanted by Dr [name] for cryptogenic stroke   LOOP RECORDER IMPLANT N/A 06/07/2013   Procedure: LOOP RECORDER IMPLANT;  Surgeon: [name], MD;  Location: [facility] CATH LAB;  Service: Cardiovascular;  Laterality: N/A;   TEE WITHOUT CARDIOVERSION N/A 06/07/2013   Procedure: TRANSESOPHAGEAL ECHOCARDIOGRAM (TEE);  Surgeon: [name], MD;  Location: [facility] ENDOSCOPY;  Service: Cardiovascular;  Laterality: N/A;    FAMILY HISTORY: Family History  Problem Relation Age of Onset   Dementia Mother    Diabetes Mother    Heart attack Father     SOCIAL HISTORY: Social History   Socioeconomic History   Marital status: Married    Spouse name: [name]   Number of children: 2   Years of  education: College   Highest education level: Not on file  Occupational History   Occupation: Retired    Fish farm manager: Psychologist, educational  Tobacco Use   Smoking status: Former   Smokeless tobacco: Never   Tobacco comments:    Quit 25 years ago.  Substance and Sexual Activity   Alcohol use: Yes    Alcohol/week: 1.0 standard drink    Types: 1  Glasses of wine per week    Comment: Rare   Drug use: No   Sexual activity: Never  Other Topics Concern   Not on file  Social History Narrative   Patient lives at home [name]).   Right handed   Drinks no caffeine daily   Social Determinants of Health   Financial Resource Strain: Not on file  Food Insecurity: Not on file  Transportation Needs: Not on file  Physical Activity: Not on file  Stress: Not on file  Social Connections: Not on file  Intimate Partner Violence: Not on file      PHYSICAL EXAM  Vitals:   07/25/21 1500  BP: (!) 149/73  Pulse: 72  Weight: 230 lb (104.3 kg)  Height: $Remove'6\' 3"'pfFLAnU$  (1.905 m)   Body mass index is 28.75 kg/m.  Generalized:  Pleasant middle-age mildly obese Caucasian male not in distress. . Afebrile. Head is nontraumatic. Neck is supple without bruit.    Cardiac exam no murmur or gallop afib. Lungs are clear to auscultation. Distal pulses are well felt.  Neurological examination  Mentation: Alert oriented to time, place, history taking. Follows all commands speech and language fluent Cranial nerve II-XII: Pupils were equal round reactive to light. Extraocular movements were full, visual field were full on confrontational test. Facial sensation and strength were normal. Uvula tongue midline. Head turning and shoulder shrug  were normal and symmetric. Motor: The motor testing reveals 5 over 5 strength of all 4 extremities. LUE 4/5 with  Increased tone left leg  . Slight left foot drop.  Diminished fine finger movements on the left.  Orbits right over left upper extremity. Sensory: Sensory testing is intact to soft touch on all 4 extremities. No evidence of extinction is noted.  Coordination: Cerebellar testing reveals good finger-nose-finger and heel-to-shin bilaterally.  Gait and station: Gait is spastic hemiplegic with left foot drop and wearing AFO.  Drags left foot. Reflexes: Deep tendon reflexes are asymmetric and brisker on the left  side  DIAGNOSTIC DATA (LABS, IMAGING, TESTING) - I reviewed patient records, labs, notes, testing and imaging myself where available.  Lab Results  Component Value Date   WBC 9.4 02/14/2021   HGB 15.5 02/14/2021   HCT 43.0 02/14/2021   MCV 95.6 02/14/2021   PLT 199 02/14/2021      Component Value Date/Time   NA 139 02/14/2021 0112   K 3.1 (L) 02/14/2021 0112   CL 99 02/14/2021 0112   CO2 26 02/14/2021 0112   GLUCOSE 191 (H) 02/14/2021 0112   BUN 19 02/14/2021 0112   CREATININE 1.10 02/14/2021 0112   CALCIUM 9.7 02/14/2021 0112   PROT 7.0 09/19/2014 1856   ALBUMIN 3.9 09/19/2014 1856   AST 24 09/19/2014 1856   ALT 13 09/19/2014 1856   ALKPHOS 58 09/19/2014 1856   BILITOT 1.1 09/19/2014 1856   GFRNONAA >60 02/14/2021 0112   GFRAA 76 (L) 09/19/2014 1856   Lab Results  Component Value Date   CHOL 139 09/20/2014   HDL 28 (L) 09/20/2014   LDLCALC 68 09/20/2014   TRIG 214 (H)  09/20/2014   CHOLHDL 5.0 09/20/2014   Lab Results  Component Value Date   HGBA1C 6.6 (H) 09/20/2014   No results found for: VITAMINB12 No results found for: TSH    ASSESSMENT AND PLAN 76 y.o. year old male  has a past medical history of Diabetes mellitus without complication ([place]), Hyperlipemia, Hypertension, large right middle cerebral artery infarct, embolic (12/01/945), and Snoring (07/03/2015).  And atrial fibrillation here with:  1. 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 Seizures stable  I had a long d/w patient about his remote stroke, left hemiparesis, atrial fibrillation, risk for recurrent stroke/TIAs, personally independently reviewed imaging studies and stroke evaluation results and answered questions.Continue Eliquis (apixaban) 5 mg twice daily daily  for secondary stroke prevention and maintain strict control of hypertension with blood pressure goal below 130/90, diabetes with hemoglobin A1c goal below 6.5% and lipids with LDL cholesterol goal below 70  mg/dL. I also advised the patient to eat a healthy diet with plenty of whole grains, cereals, fruits and vegetables, exercise regularly and maintain ideal body weight check screening follow-up carotid ultrasound study.  Followup in the future with me in only as needed and no schedule appointment was made.Greater than 50% time during this 35 minute visit was spent on counseling and coordination of care about his spastic gait, left foot drop, seizures and stroke risk.  Antony Contras, MD 07/25/2021, 3:27 PM [facility] [address], [place], [place] [phone] [phone]

## 2021-07-25 NOTE — Patient Instructions (Addendum)
I had a long d/w patient about his remote stroke, left hemiparesis, atrial fibrillation, risk for recurrent stroke/TIAs, personally independently reviewed imaging studies and stroke evaluation results and answered questions.Continue Eliquis (apixaban) 5 mg twice daily daily  for secondary stroke prevention and maintain strict control of hypertension with blood pressure goal below 130/90, diabetes with hemoglobin A1c goal below 6.5% and lipids with LDL cholesterol goal below 70 mg/dL. I also advised the patient to eat a healthy diet with plenty of whole grains, cereals, fruits and vegetables, exercise regularly and maintain ideal body weight check screening follow-up carotid ultrasound study.  Followup in the future with me in only as needed and no schedule appointment was made.

## 2021-07-25 NOTE — Progress Notes (Signed)
PATIENT: Donald [name] DOB: 10/30/1945  REASON FOR VISIT: follow up- stroke, seizures HISTORY FROM: patient  HISTORY OF PRESENT ILLNESS: Donald [name] is a 76 year old male with a history of stroke and seizures. He returns today for follow-up. He continues on Eliquis and is tolerating it well. His blood pressure is slightly elevated today. His primary care is managing his hypertension, hyperlipidemia and diabetes. He reports overall he is doing well. He uses a cane when ambulating. Went to PT had good benefit. Using AFO on brace on the left.Denies any falls. He continues on Keppra XR1000 mg at bedtime. He denies any seizure events. He operates a Teacher, music without difficulty. Patient is asking about driving. He returns today for an evaluation.  HISTORY 07/09/15:Donald [name]  Is a 76 year old male with a history of stroke and seizures. He returns today for follow-up. The patient was switched to Eliquis by his cardiologist after he was found to have atrial fibrillation in October. He is tolerating this medication well. The patient's blood pressure is elevated slightly today however this is consistent with every time he comes in to the doctor's office. He states that he has whitecoat syndrome. His primary care is managing his cholesterol  And diabetes. He denies any additional strokelike symptoms. He does not have any weakness from the stroke but does feel like his reaction time on the left side is slower. He uses a cane when  Ambulating.  The patient states that he has a hard time with  ambulatingup or down steps. He is wondering if physical therapy would be beneficial. He continues to use Keppra XR thousand milligrams at bedtime. He is not had any additional seizure events. He denies any new neurological symptoms. He returns today for an evaluation.   HISTORY 01/08/15: Donald [name] is a 76 year old male with a history of ischemic stroke and seizures. He returns today for follow-up. The  patient continues to take Plavix for stroke prevention. The patient's primary care manages his blood pressure, cholesterol and diabetes. Patient states that his blood pressure is slightly elevated today. However he relates this to "white coat syndrome." Patient states that he had a seizure in April at that time he was placed on Keppra XR 1000 mg at bedtime. He states that since then he has not had any seizures. He does not operate a motor vehicle. He is able to complete all ADLs independently. Patient states that after the stroke he has had some numbness in the bottom of the left foot and that affects his balance. He continues to use a cane when ambulating. Denies any recent falls. Denies any new neurological symptoms. He returns today for an evaluation.   HISTORY 07/05/14:Donald [name] is a 76 year old male with a history of stroke on 06/03/13. He returns today for follow-up. He is currently taking Plavix for stroke prevention. His HTN, cholesterol and Diabetes is managed by his PCP. He states that his BP has been controlled. When he checks it at home it is usually 130s/70s.  His states that his last hemoglobin A1C was 5.9 % . He continues to take metformin- his dosage has recently been decreased. His cholesterol has been in normal range according to the patient and therefore medication has not been required. Patient will use a cane when he is out in public. He does not use it at home. He feels that his walking has improved. Denies any falls. He notes that his fine motor skills in the left hand has improved. He states  that he is now able to cook at home. He does have some sensory changes in the left hand and foot. Denies any new symptoms.     HISTORY 12/20/13 (Donald [name]): is an 76 y.o. male who was visiting here for the holidays on 06/03/2013.  When he got up he was normal. As he was returning to bed began to be short of breath. Patient then was noted to have slurred speech by his wife and she noted a facial droop.  Patient seemed to be weak on the left a well. EMS was called and the patient was brought in as a code stroke. Initial NIHSS of 8. Patient was given TPA. No intervenable lesion was seen on CTA. He was admitted to the neuro ICU for further evaluation and treatment.  CT angio head showed Subtle attenuation of the distal right superior MCA territory branch vessels as compared to the left. No proximal branch occlusion or high-grade flow-limiting stenosis identified within the right middle cerebral artery proximally.  Otherwise unremarkable CTA of the head without evidence of occlusion, high-grade stenosis, or aneurysm elsewhere within the brain.   CT angio neck was a normal CTA of the neck without evidence of high-grade stenosis, occlusion, or dissection. 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  MRI of the brain showed an acute large right middle cerebral artery territory infarct. Minimal underlying petechial hemorrhage without lobar hematoma. 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 Minimal additional white matter changes suggest chronic small vessel ischemic disease. Acute on chronic paranasal sinusitis. 2D Echocardiogram with the estimated ejection fraction was in the range of 60% to 65%. He was admitted to [facility] 06/07/2013 through 07/02/2013. He is completed home physical therapy and occupational therapy and is about to start going to outpatient neuro rehabilitation.  He is making good progress with his rehabilitation and is very determined.  He and his wife have moved in with her daughter here in [place]. He has established care with Dr. Orland [name] and is seeing him every 3 weeks for adjustments to his hypertensive medications, as his hypertension has been very difficult to control since his 73s.  He thinks he forgot to take his blood pressure medicine the day he had the stroke.  His blood pressure is elevated office today at 161/90, he is  tolerating Plavix well without any significant bruising or bleeding. He is able to ambulate short distances with a hemiwalker and AFO brace on the left. He is having some spasticity in the left arm with clonus at times.     Update 07/14/2016 : He returns for follow-up after last visit 6 months ago. He is a complaint by his wife. He states is doing well without recurrent stroke or TIA symptoms. He remains on eliquis which is tolerating well without bleeding or bruising. He states his blood pressure is usually well controlled at home in the 5:46 systolic range but does tend to go up when he visits physicians. Today it is 151/90 in office. He states his diabetes is well controlled and last him about an A1c was 5.9. He does not check his fasting sugars. He is tolerating Keppra XR 1000 mg daily well without side effects and states he is not had any seizures for more than 2 years. He still has some difficulty walking but wears an AFO and walks with stiffness of his left leg. He is a height but no falls or injuries. He has not had any follow-up carotid ultrasound done for  more than 2 years. Updated 07/15/2018 : He returns for follow-up after last visit 2 years ago.  Continues to do well and has not had recurrent stroke or TIA symptoms since 2015.  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  He walks with a cane with stiffness of his left leg.  He has had no falls or injuries.  He has had no recurrent stroke or TIA symptoms.  He remains on Eliquis which is tolerating well with only minor bruising.  He states his fasting sugars have all been good and last hemoglobin A1c was 6.8.  His blood pressure is well controlled though it is slightly elevated in office today at 144/82.  He has not had any seizures since 2016 and remains on Keppra XR 1 gm at night and is tolerating it well without any side effects.  He is considering having penile implant surgery and has already met with cardiologist Dr.  [name] and got cardiac clearance.  He underwent medical stress test which went fine.  He has no new complaints today. Update 07/25/2019 : He returns for follow-up after last visit a year ago.  Continues to do well and has not had any recurrent stroke or TIA symptoms since his original stroke in 2015.  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  He remains on Eliquis which is tolerating well without bruising or bleeding.  Is also tolerating Lipitor 10 mg well without muscle aches or pains.  His blood pressure is usually in the 130s at home though today it is slightly elevated in office at 147/97.  He did have a follow-up carotid ultrasound done after last visit on 07/26/2018 which showed no significant extracranial carotid stenosis on either side.  Patient states he may be under stress recently since his 33 year old mother-in-law has moved into live with him and his wife for the last 1 year.  He feels at times he is having some tics around his lips but he thinks this may be due to anxiety or stress.  This happens only intermittently and is not bothersome. Update 07/24/2020: He returns for follow-up after last visit a year ago.  Continues to do well and has not had any any recurrent seizures or strokes or TIA symptoms.  Is tolerating Keppra well without any side effects.  He remains on Eliquis which is tolerating well without any bleeding and only minor bruising.  Blood pressure is quite well controlled at home though today it is elevated in office at 155/99.  His diabetes control is good and last A1c was 7.1.  He has gained about 8 pounds and is trying to lose weight.  He remains on Pravachol which is tolerating well without side effects and last lipid profile was satisfactory 3 months ago.  He is recently started doing some outpatient therapy to improve his walking and balance.  He has no new complaints today. Update 07/25/2020 : Patient returns for follow-up after last visit a year ago.   Continues to do well and has not had any recurrent stroke or TIA symptoms now since January 2015.  He has had no seizures as well since her original seizure in 2016.  He remains on Keppra XR 1000 mg at night which is tolerating well without side effects.  He is reluctant to consider reducing the medication and says been seizure-free for so long.  He remains on Eliquis for his A-fib and is tolerating well without bleeding or bruising or other  side effects.  States his blood pressure is usually under good control though today it is slightly elevated in office at 149/73.  He gets his A1c and lipid profile checked every 3 months by his primary physician Dr. London Pepper and last A1c was 7.1 but it does fluctuate between 6.7-7.1 every 3 months.  He is states that his cholesterol has been under good control though I do not have actual reports to review today.  Patient states he is careful with his walking and does still use a cane.  He still has stiffness and slight foot drop in his left leg.  He had a couple of falls around Christmas and he fell backwards and is carrying objects in his hand and was not careful and his foot got stuck.  He has had no recent falls and is doing better.  Carotid ultrasound on 08/10/2020 showed no significant extracranial stenosis.  2D echo on 08/17/2020 showed normal ejection fraction without cardiac source of embolism.Marland Kitchen REVIEW OF SYSTEMS: Out of a complete 14 system review of symptoms, the patient complains only of the following symptoms, and all other reviewed systems are negative. Left foot drop, gait difficulty, facial tics, stiffness of the leg only and all other systems negative ALLERGIES: Allergies  Allergen Reactions   Bitolterol    Bidil [Isosorb Dinitrate-Hydralazine] Other (See Comments)    Lethargic,tired     HOME MEDICATIONS: Outpatient Medications Prior to Visit  Medication Sig Dispense Refill   acetaminophen (TYLENOL) 325 MG tablet Take 325 mg by mouth every 6 (six)  hours as needed for moderate pain or headache.     AMBULATORY NON FORMULARY MEDICATION Medication Name: Diltiazem gel 2 % twice daily  use pea sized amount per rectum 30 g 1   amLODipine (NORVASC) 10 MG tablet Take 1 tablet (10 mg total) by mouth daily. (Patient taking differently: Take 10 mg by mouth at bedtime.) 30 tablet 1   Apoaequorin (PREVAGEN PO) Take 1 tablet by mouth daily.     Ascorbic Acid (VITAMIN C) 1000 MG tablet Take 1,000 mg by mouth daily.     chlorthalidone (HYGROTON) 25 MG tablet Take 25 mg by mouth daily.     Cholecalciferol (VITAMIN D-3) 1000 units CAPS Take 1,000 Units by mouth daily.     cloNIDine (CATAPRES) 0.3 MG tablet Take 0.3 mg by mouth 2 (two) times daily.     co-enzyme Q-10 30 MG capsule Take 30 mg by mouth daily.      cyanocobalamin 1000 MCG tablet Take 1,000 mcg by mouth daily.     ELIQUIS 5 MG TABS tablet TAKE 1 TABLET BY MOUTH TWICE A DAY 60 tablet 5   lisinopril (PRINIVIL,ZESTRIL) 40 MG tablet Take 1 tablet (40 mg total) by mouth daily. (Patient taking differently: Take 40 mg by mouth every evening.) 30 tablet 1   metFORMIN (GLUCOPHAGE-XR) 500 MG 24 hr tablet Take 500 mg by mouth. 2 tabs bid     Multiple Vitamin (MULTIVITAMIN) capsule Take 1 capsule by mouth daily.     potassium chloride SA (K-DUR,KLOR-CON) 20 MEQ tablet Take 20 mEq by mouth 4 (four) times daily.      potassium chloride SA (KLOR-CON M) 20 MEQ tablet 1 tablet     pravastatin (PRAVACHOL) 10 MG tablet Take 10 mg by mouth daily.     spironolactone (ALDACTONE) 25 MG tablet 1 tablet     vitamin E 400 UNIT capsule Take 400 Units by mouth daily.     zinc gluconate 50 MG  tablet Take 50 mg by mouth daily.     levETIRAcetam (KEPPRA XR) 500 MG 24 hr tablet TAKE 2 TABLETS (1,000 MG TOTAL) BY MOUTH AT BEDTIME. 180 tablet 3   hydrALAZINE (APRESOLINE) 10 MG tablet Take 10 mg by mouth in the morning and at bedtime.     No facility-administered medications prior to visit.    PAST MEDICAL HISTORY: Past  Medical History:  Diagnosis Date   Diabetes mellitus without complication ([place])    Hyperlipemia    Hypertension    large right middle cerebral artery infarct, embolic 10/08/7469   a. s/p IV tPA, b. source unknown, c. loop recorder placed   Snoring 07/03/2015    PAST SURGICAL HISTORY: Past Surgical History:  Procedure Laterality Date   LOOP RECORDER IMPLANT  06/07/2013   MDT LinQ implanted by Dr [name] for cryptogenic stroke   LOOP RECORDER IMPLANT N/A 06/07/2013   Procedure: LOOP RECORDER IMPLANT;  Surgeon: [name], MD;  Location: [facility] CATH LAB;  Service: Cardiovascular;  Laterality: N/A;   TEE WITHOUT CARDIOVERSION N/A 06/07/2013   Procedure: TRANSESOPHAGEAL ECHOCARDIOGRAM (TEE);  Surgeon: [name], MD;  Location: [facility] ENDOSCOPY;  Service: Cardiovascular;  Laterality: N/A;    FAMILY HISTORY: Family History  Problem Relation Age of Onset   Dementia Mother    Diabetes Mother    Heart attack Father     SOCIAL HISTORY: Social History   Socioeconomic History   Marital status: Married    Spouse name: [name]   Number of children: 2   Years of education: College   Highest education level: Not on file  Occupational History   Occupation: Retired    Fish farm manager: Psychologist, educational  Tobacco Use   Smoking status: Former   Smokeless tobacco: Never   Tobacco comments:    Quit 25 years ago.  Substance and Sexual Activity   Alcohol use: Yes    Alcohol/week: 1.0 standard drink    Types: 1 Glasses of wine per week    Comment: Rare   Drug use: No   Sexual activity: Never  Other Topics Concern   Not on file  Social History Narrative   Patient lives at home [name]).   Right handed   Drinks no caffeine daily   Social Determinants of Health   Financial Resource Strain: Not on file  Food Insecurity: Not on file  Transportation Needs: Not on file  Physical Activity: Not on file  Stress: Not on file  Social Connections: Not on file  Intimate Partner Violence: Not on file       PHYSICAL EXAM  Vitals:   07/25/21 1500  BP: (!) 149/73  Pulse: 72  Weight: 230 lb (104.3 kg)  Height: _0  (1.905 m)   Body mass index is 28.75 kg/m.  Generalized:  Pleasant middle-age mildly obese Caucasian male not in distress. . Afebrile. Head is nontraumatic. Neck is supple without bruit.    Cardiac exam no murmur or gallop afib. Lungs are clear to auscultation. Distal pulses are well felt.  Neurological examination  Mentation: Alert oriented to time, place, history taking. Follows all commands speech and language fluent Cranial nerve II-XII: Pupils were equal round reactive to light. Extraocular movements were full, visual field were full on confrontational test. Facial sensation and strength were normal. Uvula tongue midline. Head turning and shoulder shrug  were normal and symmetric. Motor: The motor testing reveals 5 over 5 strength of all 4 extremities. LUE 4/5 with  Increased tone left leg  .  Slight left foot drop.  Diminished fine finger movements on the left.  Orbits right over left upper extremity. Sensory: Sensory testing is intact to soft touch on all 4 extremities. No evidence of extinction is noted.  Coordination: Cerebellar testing reveals good finger-nose-finger and heel-to-shin bilaterally.  Gait and station: Gait is spastic hemiplegic with left foot drop and wearing AFO.  Drags left foot. Reflexes: Deep tendon reflexes are asymmetric and brisker on the left side  DIAGNOSTIC DATA (LABS, IMAGING, TESTING) - I reviewed patient records, labs, notes, testing and imaging myself where available.  Lab Results  Component Value Date   WBC 9.4 02/14/2021   HGB 15.5 02/14/2021   HCT 43.0 02/14/2021   MCV 95.6 02/14/2021   PLT 199 02/14/2021      Component Value Date/Time   NA 139 02/14/2021 0112   K 3.1 (L) 02/14/2021 0112   CL 99 02/14/2021 0112   CO2 26 02/14/2021 0112   GLUCOSE 191 (H) 02/14/2021 0112   BUN 19 02/14/2021 0112   CREATININE 1.10  02/14/2021 0112   CALCIUM 9.7 02/14/2021 0112   PROT 7.0 09/19/2014 1856   ALBUMIN 3.9 09/19/2014 1856   AST 24 09/19/2014 1856   ALT 13 09/19/2014 1856   ALKPHOS 58 09/19/2014 1856   BILITOT 1.1 09/19/2014 1856   GFRNONAA >60 02/14/2021 0112   GFRAA 76 (L) 09/19/2014 1856   Lab Results  Component Value Date   CHOL 139 09/20/2014   HDL 28 (L) 09/20/2014   LDLCALC 68 09/20/2014   TRIG 214 (H) 09/20/2014   CHOLHDL 5.0 09/20/2014   Lab Results  Component Value Date   HGBA1C 6.6 (H) 09/20/2014   No results found for: VITAMINB12 No results found for: TSH    ASSESSMENT AND PLAN 76 y.o. year old male  has a past medical history of Diabetes mellitus without complication ([place]), Hyperlipemia, Hypertension, large right middle cerebral artery infarct, embolic (0/06/6008), and Snoring (07/03/2015). here with:  1. 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 Seizures stable I had a long d/w patient about his remote stroke, left hemiparesis, atrial fibrillation, risk for recurrent stroke/TIAs, personally independently reviewed imaging studies and stroke evaluation results and answered questions.Continue Eliquis (apixaban) 5 mg twice daily daily  for secondary stroke prevention and maintain strict control of hypertension with blood pressure goal below 130/90, diabetes with hemoglobin A1c goal below 6.5% and lipids with LDL cholesterol goal below 70 mg/dL. I also advised the patient to eat a healthy diet with plenty of whole grains, cereals, fruits and vegetables, exercise regularly and maintain ideal body weight check screening follow-up carotid ultrasound study.  Followup in the future with me in only as needed and no schedule appointment was made. We discussed possibility of reducing the dose but patient is reluctant to do so.   . Greater than 50% time during this 35 minute visit was spent on counseling and coordination of care about his spastic gait, left foot drop,  seizures and stroke risk.  Antony Contras, MD 07/25/2021, 3:32 PM [facility] [address] [place], [place] [address] [phone]  Personally  participated in and made any corrections needed to history, physical, neuro exam,assessment and plan as stated above.   Sarina Ill, MD [facility]

## 2021-07-30 DIAGNOSIS — I10 Essential (primary) hypertension: Secondary | ICD-10-CM | POA: Diagnosis not present

## 2021-08-06 ENCOUNTER — Other Ambulatory Visit: Payer: Self-pay

## 2021-08-06 ENCOUNTER — Ambulatory Visit (HOSPITAL_COMMUNITY)
Admission: RE | Admit: 2021-08-06 | Discharge: 2021-08-06 | Disposition: A | Discharge status: Home / Self Care | Payer: Medicare Other | Source: Ambulatory Visit | Attending: Neurology | Admitting: Neurology

## 2021-08-06 DIAGNOSIS — I69354 Hemiplegia and hemiparesis following cerebral infarction affecting left non-dominant side: Secondary | ICD-10-CM | POA: Insufficient documentation

## 2021-08-06 NOTE — Progress Notes (Signed)
Carotid artery duplex has been completed. ?Preliminary results can be found in CV Proc through chart review.  ? ?08/06/21 3:28 PM ?Carlos Levering RVT   ?

## 2021-08-09 NOTE — Progress Notes (Signed)
Kindly inform the patient that carotid ultrasound study did not show significant blockages on either side

## 2021-08-12 ENCOUNTER — Telehealth: Payer: Self-pay | Admitting: *Deleted

## 2021-08-12 NOTE — Telephone Encounter (Signed)
Pt verified by name and DOB,  normal results given per provider, pt voiced understanding all question answered. °

## 2021-08-12 NOTE — Telephone Encounter (Signed)
-----   Message from Garvin Fila, MD sent at 08/09/2021  3:25 PM EST ----- ?Kindly inform the patient that carotid ultrasound study did not show significant blockages on either side ?

## 2021-08-15 ENCOUNTER — Other Ambulatory Visit: Payer: Self-pay | Admitting: Internal Medicine

## 2021-08-15 NOTE — Telephone Encounter (Signed)
Prescription refill request for Eliquis received. ?Indication: afib  ?Last office visit: allred, 07/04/2020 ?Scr: 1.10, 02/14/2021 ?Age: 76 yo  ?Weight: 104.3 kg  ? ? ?Pt is scheduled to see Charlcie Cradle on 09/05/2021 ? ?

## 2021-08-27 DIAGNOSIS — I4891 Unspecified atrial fibrillation: Secondary | ICD-10-CM | POA: Diagnosis not present

## 2021-08-27 DIAGNOSIS — I10 Essential (primary) hypertension: Secondary | ICD-10-CM | POA: Diagnosis not present

## 2021-08-27 DIAGNOSIS — E1169 Type 2 diabetes mellitus with other specified complication: Secondary | ICD-10-CM | POA: Diagnosis not present

## 2021-08-29 DIAGNOSIS — I10 Essential (primary) hypertension: Secondary | ICD-10-CM | POA: Diagnosis not present

## 2021-08-30 DIAGNOSIS — I10 Essential (primary) hypertension: Secondary | ICD-10-CM | POA: Diagnosis not present

## 2021-09-05 ENCOUNTER — Ambulatory Visit: Payer: Medicare Other | Admitting: Physician Assistant

## 2021-09-11 ENCOUNTER — Other Ambulatory Visit: Payer: Self-pay | Admitting: Internal Medicine

## 2021-09-11 NOTE — Telephone Encounter (Signed)
Pt last saw Dr [name] 07/04/20, pt is overdue for follow-up. Pt has an appt to see Tommye Standard, PA on 10/03/21.  Last labs 02/14/21 Creat 1.10, age 76, weight 104.3kg, based on specified criteria pt is on appropriate dosage of Eliquis '5mg'$  BID for afib.  Will refill rx.  ?

## 2021-09-29 DIAGNOSIS — I10 Essential (primary) hypertension: Secondary | ICD-10-CM | POA: Diagnosis not present

## 2021-10-02 NOTE — Progress Notes (Signed)
? ? ? ?Electrophysiology Office Note ?Date: 10/02/2021 ? ?ID:  Donald [name], DOB 28-Nov-1945, MRN [id number] ? ?PCP: London Pepper, MD ?Electrophysiologist: Allred ? ?CC:  annual visit ? ?Donald [name] is a 76 y.o. male w/PMHx of DM, HTN, HLD, stroke > loop > AFib > permanent AFib ? ?He comes today to be seen for Dr. [name], last seen by him Feb 2022, he was rate controleld permanent AFib, loop no longer functioning, offered to remove though he declined. ?No changes were made. ? ? ?TODAY ?He is doing great. ?Walks regularly for exercise, by his watch since may of lat year to today he has walked over 171mles! ?No exertional intolerances. ?No CP, no cardiac awareness, is unaware of his AFib ?Infrequently sees some blood with BM, otherwise no bleeding or signs of bleeding ?No near syncope or syncope. ? ?Sees his PMD regularly, has labs Q3  mo ? ?Device information ?MDT LINQ implanted 06/07/13 > EOS ? ? ?Past Medical History:  ?Diagnosis Date  ? Diabetes mellitus without complication ([facility]   ? Hyperlipemia   ? Hypertension   ? [name] right middle cerebral artery infarct, embolic 11/0/1751 ? a. s/p IV tPA, b. source unknown, c. loop recorder placed  ? Snoring 07/03/2015  ? ?Past Surgical History:  ?Procedure Laterality Date  ? LOOP RECORDER IMPLANT  06/07/2013  ? 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 LOOP RECORDER IMPLANT N/A 06/07/2013  ? Procedure: LOOP RECORDER IMPLANT;  Surgeon: JCoralyn Mark MD;  [facility];  Service: Cardiovascular;  Laterality: N/A;  ? TEE WITHOUT CARDIOVERSION N/A 06/07/2013  ? Procedure: TRANSESOPHAGEAL ECHOCARDIOGRAM (TEE);  Surgeon: KDorothy Spark MD;  [facility]  Service: Cardiovascular;  Laterality: N/A;  ? ? ?Current Outpatient Medications  ?Medication Sig Dispense Refill  ? acetaminophen (TYLENOL) 325 MG tablet Take 325 mg by mouth every 6 (six) hours as needed for moderate pain or headache.    ? AMBULATORY NON FORMULARY MEDICATION Medication  Name: Diltiazem gel 2 % twice daily  use pea sized amount per rectum 30 g 1  ? amLODipine (NORVASC) 10 MG tablet Take 1 tablet (10 mg total) by mouth daily. (Patient taking differently: Take 10 mg by mouth at bedtime.) 30 tablet 1  ? apixaban (ELIQUIS) 5 MG TABS tablet Take 1 tablet (5 mg total) by mouth 2 (two) times daily. OVERDUE for follow-up, MUST see MD for FUTURE refills. 60 tablet 1  ? Apoaequorin (PREVAGEN PO) Take 1 tablet by mouth daily.    ? Ascorbic Acid (VITAMIN C) 1000 MG tablet Take 1,000 mg by mouth daily.    ? chlorthalidone (HYGROTON) 25 MG tablet Take 25 mg by mouth daily.    ? Cholecalciferol (VITAMIN D-3) 1000 units CAPS Take 1,000 Units by mouth daily.    ? cloNIDine (CATAPRES) 0.3 MG tablet Take 0.3 mg by mouth 2 (two) times daily.    ? co-enzyme Q-10 30 MG capsule Take 30 mg by mouth daily.     ? cyanocobalamin 1000 MCG tablet Take 1,000 mcg by mouth daily.    ? levETIRAcetam (KEPPRA XR) 500 MG 24 hr tablet Take 2 tablets (1,000 mg total) by mouth at bedtime. 180 tablet 3  ? lisinopril (PRINIVIL,ZESTRIL) 40 MG tablet Take 1 tablet (40 mg total) by mouth daily. (Patient taking differently: Take 40 mg by mouth every evening.) 30 tablet 1  ? metFORMIN (GLUCOPHAGE-XR) 500 MG 24 hr tablet Take 500 mg by mouth. 2 tabs bid    ?  Multiple Vitamin (MULTIVITAMIN) capsule Take 1 capsule by mouth daily.    ? potassium chloride SA (K-DUR,KLOR-CON) 20 MEQ tablet Take 20 mEq by mouth 4 (four) times daily.     ? potassium chloride SA (KLOR-CON M) 20 MEQ tablet 1 tablet    ? pravastatin (PRAVACHOL) 10 MG tablet Take 10 mg by mouth daily.    ? spironolactone (ALDACTONE) 25 MG tablet 1 tablet    ? vitamin E 400 UNIT capsule Take 400 Units by mouth daily.    ? zinc gluconate 50 MG tablet Take 50 mg by mouth daily.    ? ?No current facility-administered medications for this visit.  ? ? ?Allergies:   Bitolterol and Bidil [isosorb dinitrate-hydralazine]  ? ?Social History: ?Social History  ? ?Socioeconomic History   ? Marital status: Married  ?  Spouse name: [name]  ? Number of children: 2  ? Years of education: College  ? Highest education level: Not on file  ?Occupational History  ? Occupation: Retired  ?  Employer: [employer]  ?Tobacco Use  ? Smoking status: Former  ? Smokeless tobacco: Never  ? Tobacco comments:  ?  Quit 25 years ago.  ?Substance and Sexual Activity  ? Alcohol use: Yes  ?  Alcohol/week: 1.0 standard drink  ?  Types: 1 Glasses of wine per week  ?  Comment: Rare  ? Drug use: No  ? Sexual activity: Never  ?Other Topics Concern  ? Not on file  ?Social History Narrative  ? Patient lives at home [name]).  ? Right handed  ? Drinks no caffeine daily  ? ?Social Determinants of Health  ? ?Financial Resource Strain: Not on file  ?Food Insecurity: Not on file  ?Transportation Needs: Not on file  ?Physical Activity: Not on file  ?Stress: Not on file  ?Social Connections: Not on file  ?Intimate Partner Violence: Not on file  ? ? ?Family History: ?Family History  ?Problem Relation Age of Onset  ? Dementia Mother   ? Diabetes Mother   ? Heart attack Father   ? ? ?Review of Systems: ?All other systems reviewed and are otherwise negative except as noted above. ? ? ?Physical Exam: ?VS:  There were no vitals taken for this visit. , BMI There is no height or weight on file to calculate BMI. ?Wt Readings from Last 3 Encounters:  ?07/25/21 230 lb (104.3 kg)  ?02/26/21 231 lb (104.8 kg)  ?02/14/21 228 lb (103.4 kg)  ? ?There were no vitals taken for this visit. ? ?GEN- The patient is well appearing, alert and oriented x 3 today.   ?HEENT: normocephalic, atraumatic; sclera clear, conjunctiva pink; hearing intact; oropharynx clear; neck supple  ?Lungs- CTA b/l, normal work of breathing.  No wheezes, rales, rhonchi ?Heart-  Irregular rate and rhythm  ?GI- soft, non-tender, non-distended, bowel sounds present  ?Extremities- trace edema ?MS- no significant deformity or atrophy ?Skin- warm and dry, no rash or lesion  ?Psych-  euthymic mood, full affect ?Neuro- walks with the aid of cane, L sided weakness ? ?EKG:  not done today ? ? ?08/17/2020: TTE ?1. Left ventricular ejection fraction, by estimation, is 65 to 70%. The  ?left ventricle has normal function. The left ventricle has no regional  ?wall motion abnormalities. There is moderate concentric LVH with severe  ?asymmetric left ventricular  ?hypertrophy of the basal segment. Left ventricular diastolic function  ?could not be evaluated.  ? 2. Right ventricular systolic function is normal. The right ventricular  ?size  is normal. Tricuspid regurgitation signal is inadequate for assessing  ?PA pressure.  ? 3. Left atrial size was moderately dilated.  ? 4. The mitral valve is grossly normal. Trivial mitral valve  ?regurgitation.  ? 5. The aortic valve is tricuspid. Aortic valve regurgitation is not  ?visualized.  ? 6. The inferior vena cava is normal in size with <50% respiratory  ?variability, suggesting right atrial pressure of 8 mmHg.  ? ?Comparison(s): Changes from prior study are noted. 08/03/2015: LVEF 65-70%,  ?mild LVH, mild LAE.  ? ? ?06/16/2018: stress myoview ?Nuclear stress EF: 52%. ?No T wave inversion was noted during stress. ?There was no ST segment deviation noted during stress. ?This is a low risk study. ?  ?No reversible ischemia. LVEF 52% with normal wall motion. This is a low risk study. ? ? ? ?08/03/15: Echocardiogram ?Study Conclusions ? - Left ventricle: The cavity size was normal. Wall thickness was ?  increased in a pattern of mild LVH. Systolic function was ?  vigorous. The estimated ejection fraction was in the range of 65% ?  to 70%. Wall motion was normal; there were no regional wall ?  motion abnormalities. The study is not technically sufficient to ?  allow evaluation of LV diastolic function. ?- Aortic valve: Mildly calcified annulus. Trileaflet. ?- Mitral valve: There was trivial regurgitation. ?- Left atrium: The atrium was mildly dilated. ?- Right atrium: The  atrium was at the upper limits of normal in ?  size. ?- Atrial septum: No defect or patent foramen ovale was identified. ?- Tricuspid valve: There was trivial regurgitation. ?- Pulmonary arteries: Sy

## 2021-10-03 ENCOUNTER — Ambulatory Visit (INDEPENDENT_AMBULATORY_CARE_PROVIDER_SITE_OTHER): Payer: Medicare Other | Admitting: Physician Assistant

## 2021-10-03 ENCOUNTER — Encounter: Payer: Self-pay | Admitting: Physician Assistant

## 2021-10-03 VITALS — BP 138/86 | HR 54 | Ht 75.0 in | Wt 221.0 lb

## 2021-10-03 DIAGNOSIS — I10 Essential (primary) hypertension: Secondary | ICD-10-CM | POA: Diagnosis not present

## 2021-10-03 DIAGNOSIS — I4821 Permanent atrial fibrillation: Secondary | ICD-10-CM

## 2021-10-03 MED ORDER — APIXABAN 5 MG PO TABS: 5.0000 mg | ORAL_TABLET | Freq: Two times a day (BID) | ORAL | 2 refills | Status: DC

## 2021-10-03 NOTE — Patient Instructions (Signed)
Medication Instructio ? ?Your physician recommends that you continue on your current medications as directed. Please refer to the Current Medication list given to you today. ? ? ?*If you need a refill on your cardiac medications before your next appointment, please call your pharmacy* ? ? ?Lab Work: [place] ? ? ?If you have labs (blood work) drawn today and your tests are completely normal, you will receive your results only by: ?MyChart Message (if you have MyChart) OR ?A paper copy in the mail ?If you have any lab test that is abnormal or we need to change your treatment, we will call you to review the results. ? ? ?Testing/Procedures: NONE ORDERED  TODAY ? ? ?Follow-Up: ?At [facility], you and your health needs are our priority.  As part of our continuing mission to provide you with exceptional heart care, we have created designated Provider Care Teams.  These Care Teams include your primary Cardiologist (physician) and Advanced Practice Providers (APPs -  Physician Assistants and Nurse Practitioners) who all work together to provide you with the care you need, when you need it. ? ?We recommend signing up for the patient portal called "MyChart".  Sign up information is provided on this After Visit Summary.  MyChart is used to connect with patients for Virtual Visits (Telemedicine).  Patients are able to view lab/test results, encounter notes, upcoming appointments, etc.  Non-urgent messages can be sent to your provider as well.   ?To learn more about what you can do with MyChart, go to [email or web address].   ? ?Your next appointment:   ?1 year(s) ? ?The format for your next appointment:   ?In Person ? ?Provider:   ?You may see Dr. [name] or one of the following Advanced Practice Providers on your designated Care Team:   ?Tommye Standard, PA-C ? ? ? ? ?Other Instructions ? ? ?Important Information About Sugar ? ? ? ? ?  ?

## 2021-10-17 DIAGNOSIS — L603 Nail dystrophy: Secondary | ICD-10-CM | POA: Diagnosis not present

## 2021-10-17 DIAGNOSIS — B351 Tinea unguium: Secondary | ICD-10-CM | POA: Diagnosis not present

## 2021-10-17 DIAGNOSIS — E1151 Type 2 diabetes mellitus with diabetic peripheral angiopathy without gangrene: Secondary | ICD-10-CM | POA: Diagnosis not present

## 2021-10-29 DIAGNOSIS — I6930 Unspecified sequelae of cerebral infarction: Secondary | ICD-10-CM | POA: Diagnosis not present

## 2021-10-29 DIAGNOSIS — I10 Essential (primary) hypertension: Secondary | ICD-10-CM | POA: Diagnosis not present

## 2021-10-29 DIAGNOSIS — I4891 Unspecified atrial fibrillation: Secondary | ICD-10-CM | POA: Diagnosis not present

## 2021-10-29 DIAGNOSIS — E1169 Type 2 diabetes mellitus with other specified complication: Secondary | ICD-10-CM | POA: Diagnosis not present

## 2021-10-29 DIAGNOSIS — G40909 Epilepsy, unspecified, not intractable, without status epilepticus: Secondary | ICD-10-CM | POA: Diagnosis not present

## 2021-10-30 DIAGNOSIS — I10 Essential (primary) hypertension: Secondary | ICD-10-CM | POA: Diagnosis not present

## 2021-11-29 DIAGNOSIS — I10 Essential (primary) hypertension: Secondary | ICD-10-CM | POA: Diagnosis not present

## 2021-12-30 DIAGNOSIS — I10 Essential (primary) hypertension: Secondary | ICD-10-CM | POA: Diagnosis not present

## 2022-01-15 DIAGNOSIS — L603 Nail dystrophy: Secondary | ICD-10-CM | POA: Diagnosis not present

## 2022-01-15 DIAGNOSIS — B351 Tinea unguium: Secondary | ICD-10-CM | POA: Diagnosis not present

## 2022-01-15 DIAGNOSIS — E1151 Type 2 diabetes mellitus with diabetic peripheral angiopathy without gangrene: Secondary | ICD-10-CM | POA: Diagnosis not present

## 2022-01-28 DIAGNOSIS — I10 Essential (primary) hypertension: Secondary | ICD-10-CM | POA: Diagnosis not present

## 2022-01-28 DIAGNOSIS — E1169 Type 2 diabetes mellitus with other specified complication: Secondary | ICD-10-CM | POA: Diagnosis not present

## 2022-01-28 DIAGNOSIS — I4891 Unspecified atrial fibrillation: Secondary | ICD-10-CM | POA: Diagnosis not present

## 2022-01-30 DIAGNOSIS — I10 Essential (primary) hypertension: Secondary | ICD-10-CM | POA: Diagnosis not present

## 2022-01-31 DIAGNOSIS — G40909 Epilepsy, unspecified, not intractable, without status epilepticus: Secondary | ICD-10-CM | POA: Diagnosis not present

## 2022-01-31 DIAGNOSIS — Z0000 Encounter for general adult medical examination without abnormal findings: Secondary | ICD-10-CM | POA: Diagnosis not present

## 2022-01-31 DIAGNOSIS — I4891 Unspecified atrial fibrillation: Secondary | ICD-10-CM | POA: Diagnosis not present

## 2022-01-31 DIAGNOSIS — Z23 Encounter for immunization: Secondary | ICD-10-CM | POA: Diagnosis not present

## 2022-01-31 DIAGNOSIS — Z1211 Encounter for screening for malignant neoplasm of colon: Secondary | ICD-10-CM | POA: Diagnosis not present

## 2022-01-31 DIAGNOSIS — D6869 Other thrombophilia: Secondary | ICD-10-CM | POA: Diagnosis not present

## 2022-01-31 DIAGNOSIS — E1169 Type 2 diabetes mellitus with other specified complication: Secondary | ICD-10-CM | POA: Diagnosis not present

## 2022-01-31 DIAGNOSIS — I6930 Unspecified sequelae of cerebral infarction: Secondary | ICD-10-CM | POA: Diagnosis not present

## 2022-01-31 DIAGNOSIS — I10 Essential (primary) hypertension: Secondary | ICD-10-CM | POA: Diagnosis not present

## 2022-01-31 DIAGNOSIS — Z136 Encounter for screening for cardiovascular disorders: Secondary | ICD-10-CM | POA: Diagnosis not present

## 2022-02-20 DIAGNOSIS — Z23 Encounter for immunization: Secondary | ICD-10-CM | POA: Diagnosis not present

## 2022-02-21 DIAGNOSIS — Z1211 Encounter for screening for malignant neoplasm of colon: Secondary | ICD-10-CM | POA: Diagnosis not present

## 2022-02-25 DIAGNOSIS — I10 Essential (primary) hypertension: Secondary | ICD-10-CM | POA: Diagnosis not present

## 2022-02-25 DIAGNOSIS — E1169 Type 2 diabetes mellitus with other specified complication: Secondary | ICD-10-CM | POA: Diagnosis not present

## 2022-02-25 DIAGNOSIS — I4891 Unspecified atrial fibrillation: Secondary | ICD-10-CM | POA: Diagnosis not present

## 2022-03-11 ENCOUNTER — Other Ambulatory Visit: Payer: Self-pay | Admitting: Physician Assistant

## 2022-03-11 NOTE — Telephone Encounter (Signed)
Prescription refill request for Eliquis received. Indication:Afib Last office visit:5/23 Scr:1.1 Age: 76 Weight:100.2 kg  Prescription refilled

## 2022-03-18 DIAGNOSIS — I10 Essential (primary) hypertension: Secondary | ICD-10-CM | POA: Diagnosis not present

## 2022-03-18 DIAGNOSIS — E1169 Type 2 diabetes mellitus with other specified complication: Secondary | ICD-10-CM | POA: Diagnosis not present

## 2022-04-08 DIAGNOSIS — I4891 Unspecified atrial fibrillation: Secondary | ICD-10-CM | POA: Diagnosis not present

## 2022-04-08 DIAGNOSIS — E1169 Type 2 diabetes mellitus with other specified complication: Secondary | ICD-10-CM | POA: Diagnosis not present

## 2022-04-08 DIAGNOSIS — I10 Essential (primary) hypertension: Secondary | ICD-10-CM | POA: Diagnosis not present

## 2022-05-09 DIAGNOSIS — I4891 Unspecified atrial fibrillation: Secondary | ICD-10-CM | POA: Diagnosis not present

## 2022-05-09 DIAGNOSIS — I6930 Unspecified sequelae of cerebral infarction: Secondary | ICD-10-CM | POA: Diagnosis not present

## 2022-05-09 DIAGNOSIS — E1169 Type 2 diabetes mellitus with other specified complication: Secondary | ICD-10-CM | POA: Diagnosis not present

## 2022-05-09 DIAGNOSIS — I10 Essential (primary) hypertension: Secondary | ICD-10-CM | POA: Diagnosis not present

## 2022-05-09 DIAGNOSIS — K5900 Constipation, unspecified: Secondary | ICD-10-CM | POA: Diagnosis not present

## 2022-05-09 DIAGNOSIS — K649 Unspecified hemorrhoids: Secondary | ICD-10-CM | POA: Diagnosis not present

## 2022-05-09 DIAGNOSIS — G40909 Epilepsy, unspecified, not intractable, without status epilepticus: Secondary | ICD-10-CM | POA: Diagnosis not present

## 2022-05-09 DIAGNOSIS — R195 Other fecal abnormalities: Secondary | ICD-10-CM | POA: Diagnosis not present

## 2022-05-09 DIAGNOSIS — Z23 Encounter for immunization: Secondary | ICD-10-CM | POA: Diagnosis not present

## 2022-05-22 DIAGNOSIS — I10 Essential (primary) hypertension: Secondary | ICD-10-CM | POA: Diagnosis not present

## 2022-05-22 DIAGNOSIS — E1169 Type 2 diabetes mellitus with other specified complication: Secondary | ICD-10-CM | POA: Diagnosis not present

## 2022-06-05 DIAGNOSIS — B351 Tinea unguium: Secondary | ICD-10-CM | POA: Diagnosis not present

## 2022-06-05 DIAGNOSIS — L603 Nail dystrophy: Secondary | ICD-10-CM | POA: Diagnosis not present

## 2022-06-05 DIAGNOSIS — E1151 Type 2 diabetes mellitus with diabetic peripheral angiopathy without gangrene: Secondary | ICD-10-CM | POA: Diagnosis not present

## 2022-06-23 DIAGNOSIS — R159 Full incontinence of feces: Secondary | ICD-10-CM | POA: Diagnosis not present

## 2022-06-23 DIAGNOSIS — R195 Other fecal abnormalities: Secondary | ICD-10-CM | POA: Diagnosis not present

## 2022-06-23 DIAGNOSIS — K5900 Constipation, unspecified: Secondary | ICD-10-CM | POA: Diagnosis not present

## 2022-06-24 ENCOUNTER — Ambulatory Visit
Admission: RE | Admit: 2022-06-24 | Discharge: 2022-06-24 | Disposition: A | Discharge status: Home / Self Care | Payer: Medicare Other | Source: Ambulatory Visit | Attending: Gastroenterology | Admitting: Gastroenterology

## 2022-06-24 ENCOUNTER — Other Ambulatory Visit: Payer: Self-pay | Admitting: Gastroenterology

## 2022-06-24 DIAGNOSIS — R159 Full incontinence of feces: Secondary | ICD-10-CM

## 2022-06-24 DIAGNOSIS — R195 Other fecal abnormalities: Secondary | ICD-10-CM

## 2022-06-24 DIAGNOSIS — K5900 Constipation, unspecified: Secondary | ICD-10-CM

## 2022-06-30 ENCOUNTER — Other Ambulatory Visit: Payer: Self-pay | Admitting: Gastroenterology

## 2022-06-30 DIAGNOSIS — K5900 Constipation, unspecified: Secondary | ICD-10-CM

## 2022-06-30 DIAGNOSIS — R935 Abnormal findings on diagnostic imaging of other abdominal regions, including retroperitoneum: Secondary | ICD-10-CM

## 2022-06-30 DIAGNOSIS — R159 Full incontinence of feces: Secondary | ICD-10-CM

## 2022-06-30 DIAGNOSIS — R195 Other fecal abnormalities: Secondary | ICD-10-CM

## 2022-07-08 DIAGNOSIS — E1169 Type 2 diabetes mellitus with other specified complication: Secondary | ICD-10-CM | POA: Diagnosis not present

## 2022-07-08 DIAGNOSIS — I10 Essential (primary) hypertension: Secondary | ICD-10-CM | POA: Diagnosis not present

## 2022-07-11 ENCOUNTER — Ambulatory Visit
Admission: RE | Admit: 2022-07-11 | Discharge: 2022-07-11 | Disposition: A | Discharge status: Home / Self Care | Payer: Medicare Other | Source: Ambulatory Visit | Attending: Gastroenterology | Admitting: Gastroenterology

## 2022-07-11 DIAGNOSIS — K5900 Constipation, unspecified: Secondary | ICD-10-CM

## 2022-07-11 DIAGNOSIS — R195 Other fecal abnormalities: Secondary | ICD-10-CM

## 2022-07-11 DIAGNOSIS — K4020 Bilateral inguinal hernia, without obstruction or gangrene, not specified as recurrent: Secondary | ICD-10-CM | POA: Diagnosis not present

## 2022-07-11 DIAGNOSIS — K449 Diaphragmatic hernia without obstruction or gangrene: Secondary | ICD-10-CM | POA: Diagnosis not present

## 2022-07-11 DIAGNOSIS — N200 Calculus of kidney: Secondary | ICD-10-CM | POA: Diagnosis not present

## 2022-07-11 DIAGNOSIS — R159 Full incontinence of feces: Secondary | ICD-10-CM

## 2022-07-11 DIAGNOSIS — K649 Unspecified hemorrhoids: Secondary | ICD-10-CM | POA: Diagnosis not present

## 2022-07-11 DIAGNOSIS — R935 Abnormal findings on diagnostic imaging of other abdominal regions, including retroperitoneum: Secondary | ICD-10-CM

## 2022-07-11 MED ORDER — IOPAMIDOL (ISOVUE-300) INJECTION 61%
100.0000 mL | Freq: Once | INTRAVENOUS | Status: AC | PRN
  Administered 2022-07-11: 100 mL via INTRAVENOUS

## 2022-07-18 DIAGNOSIS — U071 COVID-19: Secondary | ICD-10-CM | POA: Diagnosis not present

## 2022-07-22 ENCOUNTER — Other Ambulatory Visit: Payer: Self-pay | Admitting: Physician Assistant

## 2022-07-22 DIAGNOSIS — I4819 Other persistent atrial fibrillation: Secondary | ICD-10-CM

## 2022-07-22 NOTE — Telephone Encounter (Signed)
Eliquis 52m refill request received. Patient is 77years old, weight-100.2kg, Crea-1.16 on 05/09/22 [facility] [employer] [place] and last seen by RTommye Standardon 10/03/21. Dose is appropriate based on dosing criteria. Will send in refill to requested pharmacy.

## 2022-07-26 ENCOUNTER — Other Ambulatory Visit: Payer: Self-pay | Admitting: Neurology

## 2022-08-06 DIAGNOSIS — I4891 Unspecified atrial fibrillation: Secondary | ICD-10-CM | POA: Diagnosis not present

## 2022-08-06 DIAGNOSIS — I10 Essential (primary) hypertension: Secondary | ICD-10-CM | POA: Diagnosis not present

## 2022-08-06 DIAGNOSIS — E1169 Type 2 diabetes mellitus with other specified complication: Secondary | ICD-10-CM | POA: Diagnosis not present

## 2022-08-13 DIAGNOSIS — K649 Unspecified hemorrhoids: Secondary | ICD-10-CM | POA: Diagnosis not present

## 2022-08-13 DIAGNOSIS — R195 Other fecal abnormalities: Secondary | ICD-10-CM | POA: Diagnosis not present

## 2022-08-14 DIAGNOSIS — I10 Essential (primary) hypertension: Secondary | ICD-10-CM | POA: Diagnosis not present

## 2022-08-14 DIAGNOSIS — I6930 Unspecified sequelae of cerebral infarction: Secondary | ICD-10-CM | POA: Diagnosis not present

## 2022-08-14 DIAGNOSIS — E1169 Type 2 diabetes mellitus with other specified complication: Secondary | ICD-10-CM | POA: Diagnosis not present

## 2022-08-14 DIAGNOSIS — D6869 Other thrombophilia: Secondary | ICD-10-CM | POA: Diagnosis not present

## 2022-08-14 DIAGNOSIS — G40909 Epilepsy, unspecified, not intractable, without status epilepticus: Secondary | ICD-10-CM | POA: Diagnosis not present

## 2022-08-14 DIAGNOSIS — I4891 Unspecified atrial fibrillation: Secondary | ICD-10-CM | POA: Diagnosis not present

## 2022-08-21 ENCOUNTER — Other Ambulatory Visit: Payer: Self-pay | Admitting: Neurology

## 2022-09-08 ENCOUNTER — Other Ambulatory Visit: Payer: Self-pay | Admitting: Neurology

## 2022-09-09 DIAGNOSIS — E1151 Type 2 diabetes mellitus with diabetic peripheral angiopathy without gangrene: Secondary | ICD-10-CM | POA: Diagnosis not present

## 2022-09-09 DIAGNOSIS — B351 Tinea unguium: Secondary | ICD-10-CM | POA: Diagnosis not present

## 2022-09-09 DIAGNOSIS — L603 Nail dystrophy: Secondary | ICD-10-CM | POA: Diagnosis not present

## 2022-09-29 DIAGNOSIS — K649 Unspecified hemorrhoids: Secondary | ICD-10-CM | POA: Diagnosis not present

## 2022-09-29 DIAGNOSIS — R151 Fecal smearing: Secondary | ICD-10-CM | POA: Diagnosis not present

## 2022-09-29 DIAGNOSIS — R195 Other fecal abnormalities: Secondary | ICD-10-CM | POA: Diagnosis not present

## 2022-10-17 DIAGNOSIS — H538 Other visual disturbances: Secondary | ICD-10-CM | POA: Diagnosis not present

## 2022-10-17 DIAGNOSIS — I6340 Cerebral infarction due to embolism of unspecified cerebral artery: Secondary | ICD-10-CM | POA: Diagnosis not present

## 2022-10-17 DIAGNOSIS — E119 Type 2 diabetes mellitus without complications: Secondary | ICD-10-CM | POA: Diagnosis not present

## 2022-10-17 DIAGNOSIS — H04129 Dry eye syndrome of unspecified lacrimal gland: Secondary | ICD-10-CM | POA: Diagnosis not present

## 2022-10-19 NOTE — Progress Notes (Unsigned)
Electrophysiology Office Note Date: 10/19/2022  ID:  Donald [name], DOB 11-09-1945, MRN [id number]  PCP: [name], MD Electrophysiologist: Johney Frame  CC:  *** annual visit  Donald [name] is a 77 y.o. male w/PMHx of DM, HTN, HLD, stroke > loop > AFib > permanent AFib  He comes today to be seen for Dr. Johney Frame, last seen by him Feb 2022, he was rate controleld permanent AFib, loop no longer functioning, offered to remove though he declined. No changes were made.   I saw him May 2023 He is doing great. Walks regularly for exercise, by his watch since may of lat year to today he has walked over ! No exertional intolerances. No CP, no cardiac awareness, is unaware of his AFib Infrequently sees some blood with BM, otherwise no bleeding or signs of bleeding No near syncope or syncope. Sees his PMD regularly, has labs Q3  mo Rate controlled, planned to continue annual check ins  *** new EP MD *** rates *** symptoms *** eliquis, bleeding, dose, labs   Device information MDT LINQ implanted 06/07/13 > EOS   Past Medical History:  Diagnosis Date   Diabetes mellitus without complication (HCC)    Hyperlipemia    Hypertension    large right middle cerebral artery infarct, 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 s/p IV tPA, b. source unknown, c. loop recorder placed   Snoring 07/03/2015   Past Surgical History:  Procedure Laterality Date   LOOP RECORDER IMPLANT  06/07/2013   MDT LinQ implanted by Dr Johney Frame for cryptogenic stroke   LOOP RECORDER IMPLANT N/A 06/07/2013   Procedure: LOOP RECORDER IMPLANT;  Surgeon: Gardiner Rhyme, MD;  [facility];  Service: Cardiovascular;  Laterality: N/A;   TEE WITHOUT CARDIOVERSION N/A 06/07/2013   Procedure: TRANSESOPHAGEAL ECHOCARDIOGRAM (TEE);  Surgeon: Lars Masson, MD;  Location: [facility];  Service: Cardiovascular;  Laterality: N/A;    Current Outpatient Medications  Medication Sig Dispense Refill   acetaminophen  (TYLENOL) 325 MG tablet Take 325 mg by mouth every 6 (six) hours as needed for moderate pain or headache.     AMBULATORY NON FORMULARY MEDICATION Medication Name: Diltiazem gel 2 % twice daily  use pea sized amount per rectum 30 g 1   amLODipine (NORVASC) 10 MG tablet Take 1 tablet (10 mg total) by mouth daily. (Patient taking differently: Take 10 mg by mouth at bedtime.) 30 tablet 1   apixaban (ELIQUIS) 5 MG TABS tablet TAKE 1 TABLET BY MOUTH TWICE A DAY 180 tablet 1   Apoaequorin (PREVAGEN PO) Take 1 tablet by mouth daily.     Ascorbic Acid (VITAMIN C) 1000 MG tablet Take 1,000 mg by mouth daily.     chlorthalidone (HYGROTON) 25 MG tablet Take 25 mg by mouth daily.     Cholecalciferol (VITAMIN D-3) 1000 units CAPS Take 1,000 Units by mouth daily.     cloNIDine (CATAPRES) 0.3 MG tablet Take 0.3 mg by mouth 2 (two) times daily.     co-enzyme Q-10 30 MG capsule Take 30 mg by mouth daily.      cyanocobalamin 1000 MCG tablet Take 1,000 mcg by mouth daily.     levETIRAcetam (KEPPRA XR) 500 MG 24 hr tablet TAKE 2 TABLETS (1,000 MG TOTAL) BY MOUTH AT BEDTIME. APPOINTMENT NEEDED FOR FURTHER REFILLS 180 tablet 1   lisinopril (PRINIVIL,ZESTRIL) 40 MG tablet Take 1 tablet (40 mg total) by mouth daily. (Patient taking differently: Take 40 mg by mouth every evening.) 30 tablet  1   metFORMIN (GLUCOPHAGE-XR) 500 MG 24 hr tablet Take 500 mg by mouth. Take two tablets twice daily     Multiple Vitamin (MULTIVITAMIN) capsule Take 1 capsule by mouth daily.     potassium chloride SA (K-DUR,KLOR-CON) 20 MEQ tablet Take 20 mEq by mouth 2 (two) times daily.     pravastatin (PRAVACHOL) 10 MG tablet Take 10 mg by mouth daily.     spironolactone (ALDACTONE) 25 MG tablet 1 tablet (Patient not taking: Reported on 10/03/2021)     spironolactone (ALDACTONE) 50 MG tablet Take 50 mg by mouth daily.     vitamin E 400 UNIT capsule Take 400 Units by mouth daily.     zinc gluconate 50 MG tablet Take 50 mg by mouth daily.     No  current facility-administered medications for this visit.    Allergies:   Bitolterol and Bidil [isosorb dinitrate-hydralazine]   Social History: Social History   Socioeconomic History   Marital status: Married    Spouse name: Donald [name]   Number of children: 2   Years of education: College   Highest education level: Not on file  Occupational History   Occupation: Retired    Associate Professor: [employer]  Tobacco Use   Smoking status: Former   Smokeless tobacco: Never   Tobacco comments:    Quit 25 years ago.  Substance and Sexual Activity   Alcohol use: Yes    Alcohol/week: 1.0 standard drink of alcohol    Types: 1 Glasses of wine per week    Comment: Rare   Drug use: No   Sexual activity: Never  Other Topics Concern   Not on file  Social History Narrative   Patient lives at home Donald [name]).   Right handed   Drinks no caffeine daily   Social Determinants of Health   Financial Resource Strain: Not on file  Food Insecurity: Not on file  Transportation Needs: Not on file  Physical Activity: Not on file  Stress: Not on file  Social Connections: Not on file  Intimate Partner Violence: Not on file    Family History: Family History  Problem Relation Age of Onset   Dementia Mother    Diabetes Mother    Heart attack Father     Review of Systems: All other systems reviewed and are otherwise negative except as noted above.   Physical Exam: VS:  There were no vitals taken for this visit. , BMI There is no height or weight on file to calculate BMI. Wt Readings from Last 3 Encounters:  10/03/21 221 lb (100.2 kg)  07/25/21 230 lb (104.3 kg)  02/26/21 231 lb (104.8 kg)   There were no vitals taken for this visit.  GEN- The patient is well appearing, alert and oriented x 3 today.   HEENT: normocephalic, atraumatic; sclera clear, conjunctiva pink; hearing intact; oropharynx clear; neck supple  Lungs- *** CTA b/l, normal work of breathing.  No wheezes, rales,  rhonchi Heart-  *** Irregular rate and rhythm  GI- soft, non-tender, non-distended, bowel sounds present  Extremities- *** trace edema MS- no significant deformity or atrophy Skin- warm and dry, no rash or lesion  Psych- euthymic mood, full affect Neuro- walks with the aid of cane, L sided weakness  EKG:  done today and reviewed by myself ***   08/17/2020: TTE 1. Left ventricular ejection fraction, by estimation, is 65 to 70%. The  left ventricle has normal function. The left ventricle has no regional  wall motion abnormalities. There  is moderate concentric LVH with severe  asymmetric left ventricular  hypertrophy of the basal segment. Left ventricular diastolic function  could not be evaluated.   2. Right ventricular systolic function is normal. The right ventricular  size is normal. Tricuspid regurgitation signal is inadequate for assessing  PA pressure.   3. Left atrial size was moderately dilated.   4. The mitral valve is grossly normal. Trivial mitral valve  regurgitation.   5. The aortic valve is tricuspid. Aortic valve regurgitation is not  visualized.   6. The inferior vena cava is normal in size with <50% respiratory  variability, suggesting right atrial pressure of 8 mmHg.   Comparison(s): Changes from prior study are noted. 08/03/2015: LVEF 65-70%,  mild LVH, mild LAE.    06/16/2018: stress myoview Nuclear stress EF: 52%. No T wave inversion was noted during stress. There was no ST segment deviation noted during stress. This is a low risk study.   No reversible ischemia. LVEF 52% with normal wall motion. This is a low risk study.    08/03/15: Echocardiogram Study Conclusions  - Left ventricle: The cavity size was normal. Wall thickness was   increased in a pattern of mild LVH. Systolic function was   vigorous. The estimated ejection fraction was in the range of 65%   to 70%. Wall motion was normal; there were no regional wall   motion abnormalities. The study  is not technically sufficient to   allow evaluation of LV diastolic function. - Aortic valve: Mildly calcified annulus. Trileaflet. - Mitral valve: There was trivial regurgitation. - Left atrium: The atrium was mildly dilated. - Right atrium: The atrium was at the upper limits of normal in   size. - Atrial septum: No defect or patent foramen ovale was identified. - Tricuspid valve: There was trivial regurgitation. - Pulmonary arteries: Systolic pressure could not be accurately   estimated. - Pericardium, extracardiac: A trivial pericardial effusion was   identified circumferential to the heart. Impressions:  - Mild LVH with LVEF 65-70%. Indeterminate diastolic function in   the setting of atrial fibrillation. Mild left atrial enlargement.   Mildly sclerotic aortic valve. Trivial mitral and tricuspid   regurgitation. Trivial pericardial effusion.    06/07/13: TEE Study Conclusions - Left ventricle: Systolic function was normal. The   estimated ejection fraction was in the range of 55% to   60%. Wall motion was normal; there were no regional wall   motion abnormalities. - Left atrium: The atrium was dilated. No evidence of   thrombus in the atrial cavity or appendage. No evidence of   thrombus in the atrial cavity or appendage. No evidence of   thrombus in the appendage. - Right atrium: No evidence of thrombus in the atrial cavity   or appendage. - Atrial septum: No defect or patent foramen ovale was   identified. Echo contrast study showed no right-to-left   atrial level shunt, following an increase in RA pressure   induced by provocative maneuvers. - Tricuspid valve: No evidence of vegetation. No evidence of   vegetation. - Pulmonic valve: No evidence of vegetation.  Recent Labs: No results found for requested labs within last 365 days.   Assessment and Plan: 1.  Permanent Afib CHA2DS2Vasc is 5, on Eliquis, *** appropriately dosed *** Rate controlled  2.  HTN       ***  Looks ok, follows with his PMD  3. HLD Labs/lipids are followed with with his PMD    Current medicines are reviewed  at length with the patient today.   The patient does not have concerns regarding his medicines.  The following changes were made today:  none  Disposition:  ***    Signed, Francis Dowse, PA-C  10/19/2022 1:52 PM   [facility] [address] [place] [address] [phone] (office) [phone] (fax)

## 2022-10-21 ENCOUNTER — Ambulatory Visit: Payer: Medicare Other | Attending: Physician Assistant | Admitting: Physician Assistant

## 2022-10-21 ENCOUNTER — Encounter: Payer: Self-pay | Admitting: Physician Assistant

## 2022-10-21 VITALS — BP 120/80 | HR 79 | Ht 75.0 in | Wt 228.0 lb

## 2022-10-21 DIAGNOSIS — I4821 Permanent atrial fibrillation: Secondary | ICD-10-CM | POA: Insufficient documentation

## 2022-10-21 DIAGNOSIS — I4510 Unspecified right bundle-branch block: Secondary | ICD-10-CM

## 2022-10-21 DIAGNOSIS — R9431 Abnormal electrocardiogram [ECG] [EKG]: Secondary | ICD-10-CM

## 2022-10-21 DIAGNOSIS — I10 Essential (primary) hypertension: Secondary | ICD-10-CM

## 2022-10-21 NOTE — Patient Instructions (Addendum)
Medication Instructions:   Your physician recommends that you continue on your current medications as directed. Please refer to the Current Medication list given to you today.  *If you need a refill on your cardiac medications before your next appointment, please call your pharmacy*   Lab Work:  NONE ORDERED  TODAY   If you have labs (blood work) drawn today and your tests are completely normal, you will receive your results only by: MyChart Message (if you have MyChart) OR A paper copy in the mail If you have any lab test that is abnormal or we need to change your treatment, we will call you to review the results.   Testing/Procedures: Your physician has requested that you have an echocardiogram. Echocardiography is a painless test that uses sound waves to create images of your heart. It provides your doctor with information about the size and shape of your heart and how well your heart's chambers and valves are working. This procedure takes approximately one hour. There are no restrictions for this procedure. Please do NOT wear cologne, perfume, aftershave, or lotions (deodorant is allowed). Please arrive 15 minutes prior to your appointment time.    Follow-Up: At [facility], you and your health needs are our priority.  As part of our continuing mission to provide you with exceptional heart care, we have created designated Provider Care Teams.  These Care Teams include your primary Cardiologist (physician) and Advanced Practice Providers (APPs -  Physician Assistants and Nurse Practitioners) who all work together to provide you with the care you need, when you need it.  We recommend signing up for the patient portal called "MyChart".  Sign up information is provided on this After Visit Summary.  MyChart is used to connect with patients for Virtual Visits (Telemedicine).  Patients are able to view lab/test results, encounter notes, upcoming appointments, etc.  Non-urgent messages  can be sent to your provider as well.   To learn more about what you can do with MyChart, go to [email or web address].    Your next appointment:    1 year(s)  Provider:    York Pellant, MD    Other Instructions

## 2022-11-05 DIAGNOSIS — E1151 Type 2 diabetes mellitus with diabetic peripheral angiopathy without gangrene: Secondary | ICD-10-CM | POA: Diagnosis not present

## 2022-11-05 DIAGNOSIS — L603 Nail dystrophy: Secondary | ICD-10-CM | POA: Diagnosis not present

## 2022-11-05 DIAGNOSIS — L84 Corns and callosities: Secondary | ICD-10-CM | POA: Diagnosis not present

## 2022-11-05 DIAGNOSIS — B351 Tinea unguium: Secondary | ICD-10-CM | POA: Diagnosis not present

## 2022-11-14 DIAGNOSIS — I10 Essential (primary) hypertension: Secondary | ICD-10-CM | POA: Diagnosis not present

## 2022-11-14 DIAGNOSIS — E1169 Type 2 diabetes mellitus with other specified complication: Secondary | ICD-10-CM | POA: Diagnosis not present

## 2022-11-14 DIAGNOSIS — H6120 Impacted cerumen, unspecified ear: Secondary | ICD-10-CM | POA: Diagnosis not present

## 2022-11-14 DIAGNOSIS — I6930 Unspecified sequelae of cerebral infarction: Secondary | ICD-10-CM | POA: Diagnosis not present

## 2022-11-14 DIAGNOSIS — K625 Hemorrhage of anus and rectum: Secondary | ICD-10-CM | POA: Diagnosis not present

## 2022-11-18 ENCOUNTER — Ambulatory Visit (HOSPITAL_COMMUNITY): Payer: Medicare Other | Attending: Cardiology

## 2022-11-18 DIAGNOSIS — R9431 Abnormal electrocardiogram [ECG] [EKG]: Secondary | ICD-10-CM | POA: Diagnosis not present

## 2022-11-18 LAB — ECHOCARDIOGRAM COMPLETE
Area-P 1/2: 3.63 cm2
S' Lateral: 2.5 cm

## 2022-12-22 DIAGNOSIS — K641 Second degree hemorrhoids: Secondary | ICD-10-CM | POA: Diagnosis not present

## 2022-12-24 ENCOUNTER — Telehealth: Payer: Self-pay | Admitting: Physician Assistant

## 2022-12-24 NOTE — Telephone Encounter (Signed)
Pt states that he needs to talk with a nurse about getting surgery done at another office. Please advise.

## 2022-12-24 NOTE — Telephone Encounter (Signed)
Patient called to let our office know that he is talking to Dr. Maisie Fus about surgery and wanted to know about holding his eliquis. Informed patient that we have pre-op clearance team that deals with all the clearances. Informed him to give Dr. Maisie Fus office our fax number so they can send [place] the clearance request. Patient verbalized understanding.

## 2022-12-29 ENCOUNTER — Ambulatory Visit: Payer: Self-pay | Admitting: General Surgery

## 2022-12-29 ENCOUNTER — Telehealth: Payer: Self-pay | Admitting: *Deleted

## 2022-12-29 NOTE — H&P (Signed)
REFERRING PHYSICIAN:  Blenda Bridegroom, MD   PROVIDER:  Elenora Gamma, MD   MRN: [id number] DOB: 24-Aug-1945 DATE OF ENCOUNTER: 12/22/2022   Subjective    Chief Complaint: Hemorrhoids       History of Present Illness: Donald [name] is a 77 y.o. male who is seen today as an office consultation at the request of Dr. Kateri Plummer for evaluation of Hemorrhoids .   77 year old male on Eliquis after stroke 2 years ago.  He is developed rectal bleeding that is becoming more more prominent.  He has tried Anusol suppositories and Proctofoam with no long-term success.  He has soft bowel movements and denies constipation or straining.  He reports spontaneous bleeding during the night as well as after bowel movements.     Review of Systems: A complete review of systems was obtained from the patient.  I have reviewed this information and discussed as appropriate with the patient.  See HPI as well for other ROS.       Medical History: Past Medical History      Past Medical History:  Diagnosis Date   Diabetes mellitus without complication (CMS/HHS-HCC)     DVT (deep venous thrombosis) (CMS/HHS-HCC)     History of stroke     Hypertension          Problem List     Patient Active Problem List  Diagnosis   CVA (cerebral vascular accident) (CMS/HHS-HCC)   Diabetes (CMS/HHS-HCC)        Past Surgical History       Past Surgical History:  Procedure Laterality Date   HERNIA REPAIR            Allergies      Allergies  Allergen Reactions   Bitolterol Other (See Comments)        Medications Ordered Prior to Encounter        Current Outpatient Medications on File Prior to Visit  Medication Sig Dispense Refill   amLODIPine (NORVASC) 10 MG tablet TAKE 1 TABLET BY MOUTH EVERY DAY FOR 30 DAYS for 90 days       apixaban (ELIQUIS) 2.5 mg tablet Take by mouth       ascorbic acid, vitamin C, (VITAMIN C) 1000 MG tablet Take 1,000 mg by mouth once daily       chlorthalidone 25  MG tablet TAKE 1 TABLET BY MOUTH EVERY DAY THE MORNING WITH FOOD 90 DAYS       cholecalciferol (VITAMIN D3) 1000 unit capsule Take 1,000 Units by mouth once daily       cyanocobalamin (VITAMIN B12) 1000 MCG tablet Take 1,000 mcg by mouth once daily       KLOR-CON M20 20 mEq ER tablet Take 20 mEq by mouth 2 (two) times daily       levETIRAcetam (KEPPRA) 500 MG tablet Take by mouth daily       lisinopriL (ZESTRIL) 40 MG tablet Take 40 mg by mouth once daily       multivitamin with minerals Cap Take 1 capsule by mouth once daily       pravastatin (PRAVACHOL) 10 MG tablet Take 10 mg by mouth once daily       spironolactone (ALDACTONE) 25 MG tablet Take 25 mg by mouth once daily       tadalafiL (CIALIS) 5 MG tablet Take 5 mg by mouth once daily as needed       vitamin E 400 UNIT capsule Take by mouth  zinc gluconate 50 mg tablet Take 50 mg by mouth once daily        No current facility-administered medications on file prior to visit.        Family History       Family History  Problem Relation Age of Onset   Diabetes Mother     High blood pressure (Hypertension) Father     Coronary Artery Disease (Blocked arteries around heart) Father          Tobacco Use History  Social History        Tobacco Use  Smoking Status Former   Types: Cigarettes  Smokeless Tobacco Never        Social History  Social History         Socioeconomic History   Marital status: Married  Tobacco Use   Smoking status: Former      Types: Cigarettes   Smokeless tobacco: Never  Substance and Sexual Activity   Alcohol use: Not Currently   Drug use: Not Currently        Objective:         Vitals:    12/22/22 1546  BP: 132/86  Pulse: 95  Temp: 36.3 C (97.3 F)  SpO2: 97%  Weight: (!) 103.6 kg (228 lb 6.4 oz)  Height: 190.5 cm (6\' 3" )      Exam Gen: NAD Abd: soft Rectal: Slightly decreased rectal tone, no external pathology noted. skin tags present     Labs, Imaging and Diagnostic  Testing:   Procedure: Anoscopy Surgeon: Maisie Fus After the risks and benefits were explained, written consent was obtained for above procedure.  A medical assistant chaperone was present thoroughout the entire procedure.  Anesthesia: none Diagnosis: rectal bleeding Findings: Grade 2 hemorrhoids at all 3 locations with inflammation   Assessment and Plan:  Diagnoses and all orders for this visit:   Grade II hemorrhoids   77 year old male with rectal inflammation and grade 2 hemorrhoids.  We discussed performing rubber band ligation here in the office over the next several months versus hemorrhoidopexy in the operating room.  We discussed details of both procedures.  We discussed the risk of anesthesia with the surgical procedure.  All questions were answered.  Patient would like to think about this and will call the office when he decides which way he would like to progress.       Vanita Panda, MD Colon and Rectal Surgery [facility] Surgery

## 2022-12-29 NOTE — Telephone Encounter (Signed)
   Patient Name: Donald [name]  DOB: 1945-09-25 MRN: [id number]  Primary Cardiologist: [name], MD (Inactive)  Chart reviewed as part of pre-operative protocol coverage. Given past medical history and time since last visit, based on ACC/AHA guidelines, [name] is at acceptable risk for the planned procedure without further cardiovascular testing.   Patient was advised to hold Eliquis two days prior to procedure and should restart post procedure when hemostasis is achieved and guided by surgical team.  The patient was advised that if he develops new symptoms prior to surgery to contact our office to arrange for a follow-up visit, and he verbalized understanding.  I will route this recommendation to the requesting party via Epic fax function and remove from pre-op pool.  Please call with questions.  Napoleon Form, Leodis Rains, NP 12/29/2022, 12:21 PM

## 2022-12-29 NOTE — H&P (Signed)
REFERRING PHYSICIAN:  Blenda Bridegroom, MD   PROVIDER:  Elenora Gamma, MD   MRN: [id number] DOB: 12-Jul-1945 DATE OF ENCOUNTER: 12/22/2022   Subjective    Chief Complaint: Hemorrhoids       History of Present Illness: Donald [name] is a 77 y.o. male who is seen today as an office consultation at the request of Dr. Kateri Plummer for evaluation of Hemorrhoids .   77 year old male on Eliquis after stroke 2 years ago.  He is developed rectal bleeding that is becoming more more prominent.  He has tried Anusol suppositories and Proctofoam with no long-term success.  He has soft bowel movements and denies constipation or straining.  He reports spontaneous bleeding during the night as well as after bowel movements.     Review of Systems: A complete review of systems was obtained from the patient.  I have reviewed this information and discussed as appropriate with the patient.  See HPI as well for other ROS.       Medical History: Past Medical History      Past Medical History:  Diagnosis Date   Diabetes mellitus without complication (CMS/HHS-HCC)     DVT (deep venous thrombosis) (CMS/HHS-HCC)     History of stroke     Hypertension          Problem List     Patient Active Problem List  Diagnosis   CVA (cerebral vascular accident) (CMS/HHS-HCC)   Diabetes (CMS/HHS-HCC)        Past Surgical History       Past Surgical History:  Procedure Laterality Date   HERNIA REPAIR            Allergies      Allergies  Allergen Reactions   Bitolterol Other (See Comments)        Medications Ordered Prior to Encounter        Current Outpatient Medications on File Prior to Visit  Medication Sig Dispense Refill   amLODIPine (NORVASC) 10 MG tablet TAKE 1 TABLET BY MOUTH EVERY DAY FOR 30 DAYS for 90 days       apixaban (ELIQUIS) 2.5 mg tablet Take by mouth       ascorbic acid, vitamin C, (VITAMIN C) 1000 MG tablet Take 1,000 mg by mouth once daily       chlorthalidone 25  MG tablet TAKE 1 TABLET BY MOUTH EVERY DAY THE MORNING WITH FOOD 90 DAYS       cholecalciferol (VITAMIN D3) 1000 unit capsule Take 1,000 Units by mouth once daily       cyanocobalamin (VITAMIN B12) 1000 MCG tablet Take 1,000 mcg by mouth once daily       KLOR-CON M20 20 mEq ER tablet Take 20 mEq by mouth 2 (two) times daily       levETIRAcetam (KEPPRA) 500 MG tablet Take by mouth daily       lisinopriL (ZESTRIL) 40 MG tablet Take 40 mg by mouth once daily       multivitamin with minerals Cap Take 1 capsule by mouth once daily       pravastatin (PRAVACHOL) 10 MG tablet Take 10 mg by mouth once daily       spironolactone (ALDACTONE) 25 MG tablet Take 25 mg by mouth once daily       tadalafiL (CIALIS) 5 MG tablet Take 5 mg by mouth once daily as needed       vitamin E 400 UNIT capsule Take by mouth  zinc gluconate 50 mg tablet Take 50 mg by mouth once daily        No current facility-administered medications on file prior to visit.        Family History       Family History  Problem Relation Age of Onset   Diabetes Mother     High blood pressure (Hypertension) Father     Coronary Artery Disease (Blocked arteries around heart) Father          Tobacco Use History  Social History        Tobacco Use  Smoking Status Former   Types: Cigarettes  Smokeless Tobacco Never        Social History  Social History         Socioeconomic History   Marital status: Married  Tobacco Use   Smoking status: Former      Types: Cigarettes   Smokeless tobacco: Never  Substance and Sexual Activity   Alcohol use: Not Currently   Drug use: Not Currently        Objective:         Vitals:    12/22/22 1546  BP: 132/86  Pulse: 95  Temp: 36.3 C (97.3 F)  SpO2: 97%  Weight: (!) 103.6 kg (228 lb 6.4 oz)  Height: 190.5 cm (6\' 3" )      Exam Gen: NAD Abd: soft Rectal: Slightly decreased rectal tone, no external pathology noted. skin tags present     Labs, Imaging and Diagnostic  Testing:   Procedure: Anoscopy Surgeon: Maisie Fus After the risks and benefits were explained, written consent was obtained for above procedure.  A medical assistant chaperone was present thoroughout the entire procedure.  Anesthesia: none Diagnosis: rectal bleeding Findings: Grade 2 hemorrhoids at all 3 locations with inflammation   Assessment and Plan:  Diagnoses and all orders for this visit:   Grade II hemorrhoids   77 year old male with rectal inflammation and grade 2 hemorrhoids.  We discussed performing rubber band ligation here in the office over the next several months versus hemorrhoidopexy in the operating room.  We discussed details of both procedures.  We discussed the risk of anesthesia with the surgical procedure.  All questions were answered.  Patient decided to proceed with [facility].  Cardiac clearance obtained.  Will hold Elliquis for 2 days preop.         Vanita Panda, MD Colon and Rectal Surgery [facility] Surgery

## 2022-12-29 NOTE — Telephone Encounter (Signed)
   Pre-operative Risk Assessment    Patient Name: Donald [name]  DOB: Feb 07, 1946 MRN: [id number]      Request for Surgical Clearance    Procedure:  HEMORRHOIDOPEXY SURGERY  Date of Surgery:  Clearance TBD                                 Surgeon:  DR. Romie Levee Surgeon's Group or Practice Name:  [facility] Phone number:  [phone] Fax number:  [phone] ATTN: Michel Bickers, LPN   Type of Clearance Requested:   - Medical  - Pharmacy:  Hold Apixaban (Eliquis)     Type of Anesthesia:  General    Additional requests/questions:    Elpidio Anis   12/29/2022, 8:38 AM

## 2022-12-29 NOTE — Telephone Encounter (Signed)
Patient with diagnosis of afib on Eliquis for anticoagulation.    Procedure: HEMORRHOIDOPEXY SURGERY  Date of procedure: TBD   CHA2DS2-VASc Score = 6   This indicates a 9.7% annual risk of stroke. The patient's score is based upon: CHF History: 0 HTN History: 1 Diabetes History: 1 Stroke History: 2 Vascular Disease History: 0 Age Score: 2 Gender Score: 0      CrCl 61 ml/min  Per office protocol, patient can hold Eliquis for 2 days prior to procedure.    **This guidance is not considered finalized until pre-operative APP has relayed final recommendations.**

## 2022-12-29 NOTE — Telephone Encounter (Signed)
Pharmacy please advise on holding Eliquis prior to hemorrhoidopexy scheduled for TBD. Thank you.

## 2023-01-05 DIAGNOSIS — R296 Repeated falls: Secondary | ICD-10-CM | POA: Diagnosis not present

## 2023-01-12 ENCOUNTER — Other Ambulatory Visit: Payer: Self-pay | Admitting: Physician Assistant

## 2023-01-12 ENCOUNTER — Other Ambulatory Visit: Payer: Self-pay | Admitting: Neurology

## 2023-01-12 DIAGNOSIS — I4819 Other persistent atrial fibrillation: Secondary | ICD-10-CM

## 2023-01-13 NOTE — Telephone Encounter (Signed)
Prescription refill request for Eliquis received. Indication:afib Last office visit:5/24 Scr:1.25  6/24 Age: 77 Weight:103.4  kg  Prescription refilled

## 2023-02-05 DIAGNOSIS — I6930 Unspecified sequelae of cerebral infarction: Secondary | ICD-10-CM | POA: Diagnosis not present

## 2023-02-05 DIAGNOSIS — G40909 Epilepsy, unspecified, not intractable, without status epilepticus: Secondary | ICD-10-CM | POA: Diagnosis not present

## 2023-02-05 DIAGNOSIS — E1169 Type 2 diabetes mellitus with other specified complication: Secondary | ICD-10-CM | POA: Diagnosis not present

## 2023-02-05 DIAGNOSIS — Z1211 Encounter for screening for malignant neoplasm of colon: Secondary | ICD-10-CM | POA: Diagnosis not present

## 2023-02-05 DIAGNOSIS — D6869 Other thrombophilia: Secondary | ICD-10-CM | POA: Diagnosis not present

## 2023-02-05 DIAGNOSIS — Z0000 Encounter for general adult medical examination without abnormal findings: Secondary | ICD-10-CM | POA: Diagnosis not present

## 2023-02-05 DIAGNOSIS — I10 Essential (primary) hypertension: Secondary | ICD-10-CM | POA: Diagnosis not present

## 2023-02-05 DIAGNOSIS — I4891 Unspecified atrial fibrillation: Secondary | ICD-10-CM | POA: Diagnosis not present

## 2023-02-06 NOTE — Patient Instructions (Signed)
SURGICAL WAITING ROOM VISITATION  Patients having surgery or a procedure may have no more than 2 support people in the waiting area - these visitors may rotate.    Children under the age of 77 must have an adult with them who is not the patient.  Due to an increase in RSV and influenza rates and associated hospitalizations, children ages 57 and under may not visit patients in [facility] hospitals.  If the patient needs to stay at the hospital during part of their recovery, the visitor guidelines for inpatient rooms apply. Pre-op nurse will coordinate an appropriate time for 1 support person to accompany patient in pre-op.  This support person may not rotate.    Please refer to the [facility] website for the visitor guidelines for Inpatients (after your surgery is over and you are in a regular room).       Your procedure is scheduled on: 02/18/23   Report to [facility] Main Entrance    Report to admitting at 6:15 AM   Call this number if you have problems the morning of surgery [phone]   Do not eat food or drink liquids:After Midnight.    FOLLOW BOWEL PREP AND ANY ADDITIONAL PRE OP INSTRUCTIONS YOU RECEIVED FROM YOUR SURGEON'S OFFICE!!!     Oral Hygiene is also important to reduce your risk of infection.                                    Remember - BRUSH YOUR TEETH THE MORNING OF SURGERY WITH YOUR REGULAR TOOTHPASTE  DENTURES WILL BE REMOVED PRIOR TO SURGERY PLEASE DO NOT APPLY "Poly grip" OR ADHESIVES!!!   Stop all vitamins and herbal supplements 7 days before surgery.   Take these medicines the morning of surgery with A SIP OF WATER: Prevagen, Hygroton, Pravastatin, Spironolactone, Tylenol if needed.  DO NOT TAKE ANY ORAL DIABETIC MEDICATIONS DAY OF YOUR SURGERY: HOLD METFORMIN THE DAY OF SURGERY.             You may not have any metal on your body including hair pins, jewelry, and body piercing             Do not wear make-up, lotions, powders, perfumes or  deodorant              Men may shave face and neck.   Do not bring valuables to the hospital. [facility] IS NOT             RESPONSIBLE   FOR VALUABLES.   Contacts, glasses, dentures or bridgework may not be worn into surgery.  DO NOT BRING YOUR HOME MEDICATIONS TO THE HOSPITAL. PHARMACY WILL DISPENSE MEDICATIONS LISTED ON YOUR MEDICATION LIST TO YOU DURING YOUR ADMISSION IN THE HOSPITAL!    Patients discharged on the day of surgery will not be allowed to drive home.  Someone NEEDS to stay with you for the first 24 hours after anesthesia.   Special Instructions: Bring a copy of your healthcare power of attorney and living will documents the day of surgery if you haven't scanned them before.              Please read over the following fact sheets you were given: IF YOU HAVE QUESTIONS ABOUT YOUR PRE-OP INSTRUCTIONS PLEASE CALL [phone] Rosey [place]   If you received a COVID test during your pre-op visit  it is requested that you wear a  mask when out in public, stay away from anyone that may not be feeling well and notify your surgeon if you develop symptoms. If you test positive for Covid or have been in contact with anyone that has tested positive in the last 10 days please notify you surgeon.    [facility] - Preparing for Surgery Before surgery, you can play an important role.  Because skin is not sterile, your skin needs to be as free of germs as possible.  You can reduce the number of germs on your skin by washing with CHG (chlorahexidine gluconate) soap before surgery.  CHG is an antiseptic cleaner which kills germs and bonds with the skin to continue killing germs even after washing. Please DO NOT use if you have an allergy to CHG or antibacterial soaps.  If your skin becomes reddened/irritated stop using the CHG and inform your nurse when you arrive at Short Stay. Do not shave (including legs and underarms) for at least 48 hours prior to the first CHG shower.  You may shave your  face/neck.  Please follow these instructions carefully:  1.  Shower with CHG Soap the night before surgery and the  morning of surgery.  2.  If you choose to wash your hair, wash your hair first as usual with your normal  shampoo.  3.  After you shampoo, rinse your hair and body thoroughly to remove the shampoo.                             4.  Use CHG as you would any other liquid soap.  You can apply chg directly to the skin and wash.  Gently with a scrungie or clean washcloth.  5.  Apply the CHG Soap to your body ONLY FROM THE NECK DOWN.   Do   not use on face/ open                           Wound or open sores. Avoid contact with eyes, ears mouth and   genitals (private parts).                       Wash face,  Genitals (private parts) with your normal soap.             6.  Wash thoroughly, paying special attention to the area where your    surgery  will be performed.  7.  Thoroughly rinse your body with warm water from the neck down.  8.  DO NOT shower/wash with your normal soap after using and rinsing off the CHG Soap.                9.  Pat yourself dry with a clean towel.            10.  Wear clean pajamas.            11.  Place clean sheets on your bed the night of your first shower and do not  sleep with pets. Day of Surgery : Do not apply any lotions/deodorants the morning of surgery.  Please wear clean clothes to the hospital/surgery center.  FAILURE TO FOLLOW THESE INSTRUCTIONS MAY RESULT IN THE CANCELLATION OF YOUR SURGERY  PATIENT SIGNATURE_________________________________  NURSE SIGNATURE__________________________________  ________________________________________________________________________

## 2023-02-06 NOTE — Progress Notes (Addendum)
COVID Vaccine received:  []  No [x]  Yes Date of any COVID positive Test in last 90 days: No PCP - [name] MD Cardiologist - [facility]  Cardiac Clearance- Robin Searing -12/29/22  Chest x-ray -  EKG -  10/21/22 Epic Stress Test - 06/16/18 Epic ECHO - 11/18/22 Epic Cardiac Cath -   Bowel Prep - [x]  No  []   Yes ______  Pacemaker / ICD device [x]  No []  Yes   Spinal Cord Stimulator:[x]  No []  Yes       History of Sleep Apnea? [x]  No []  Yes   CPAP used?- [x]  No []  Yes    Does the patient monitor blood sugar?          [x]  No []  Yes  []  N/A  Patient has: [x]  NO Hx DM   []  Pre-DM                 []  DM1  [x]   DM2 Does patient have a [employer] or Dexacom? []  No []  Yes   Fasting Blood Sugar Ranges- HGB A1c  Checks Blood Sugar _____ times a day  GLP1 agonist / usual dose - no GLP1 instructions:  SGLT-2 inhibitors / usual dose - no SGLT-2 instructions:   Blood Thinner / Instructions:Eliquis- Hold x2 days prior to surgery per MD office.Last dose to be 02/15/23 Aspirin Instructions:  Comments:   Activity level: Patient is able  to climb a flight of stairs without difficulty; [x]  No CP  [x]  No SOB,  Patient can  perform ADLs without assistance.   Anesthesia review: A-fib, Loop recorder implant, R BBB(new), HTN, DM, CVA  Patient denies shortness of breath, fever, cough and chest pain at PAT appointment.  Patient verbalized understanding and agreement to the Pre-Surgical Instructions that were given to them at this PAT appointment. Patient was also educated of the need to review these PAT instructions again prior to his/her surgery.I reviewed the appropriate phone numbers to call if they have any and questions or concerns.

## 2023-02-09 ENCOUNTER — Encounter (HOSPITAL_COMMUNITY): Payer: Self-pay

## 2023-02-09 ENCOUNTER — Other Ambulatory Visit: Payer: Self-pay

## 2023-02-09 ENCOUNTER — Encounter (HOSPITAL_COMMUNITY)
Admission: RE | Admit: 2023-02-09 | Discharge: 2023-02-09 | Disposition: A | Discharge status: Home / Self Care | Payer: Medicare Other | Source: Ambulatory Visit | Attending: General Surgery

## 2023-02-09 VITALS — BP 156/98 | HR 81 | Temp 97.9°F | Resp 16 | Ht 75.0 in | Wt 227.0 lb

## 2023-02-09 DIAGNOSIS — Z01812 Encounter for preprocedural laboratory examination: Secondary | ICD-10-CM | POA: Insufficient documentation

## 2023-02-09 DIAGNOSIS — I10 Essential (primary) hypertension: Secondary | ICD-10-CM | POA: Diagnosis not present

## 2023-02-09 DIAGNOSIS — E119 Type 2 diabetes mellitus without complications: Secondary | ICD-10-CM | POA: Diagnosis not present

## 2023-02-09 DIAGNOSIS — Z8673 Personal history of transient ischemic attack (TIA), and cerebral infarction without residual deficits: Secondary | ICD-10-CM | POA: Diagnosis not present

## 2023-02-09 DIAGNOSIS — I4891 Unspecified atrial fibrillation: Secondary | ICD-10-CM | POA: Diagnosis not present

## 2023-02-09 DIAGNOSIS — Z7901 Long term (current) use of anticoagulants: Secondary | ICD-10-CM | POA: Insufficient documentation

## 2023-02-09 DIAGNOSIS — Z87891 Personal history of nicotine dependence: Secondary | ICD-10-CM | POA: Insufficient documentation

## 2023-02-09 DIAGNOSIS — E785 Hyperlipidemia, unspecified: Secondary | ICD-10-CM | POA: Insufficient documentation

## 2023-02-09 HISTORY — DX: Cardiac arrhythmia, unspecified: I49.9

## 2023-02-09 LAB — BASIC METABOLIC PANEL
Anion gap: 15 (ref 5–15)
BUN: 19 mg/dL (ref 8–23)
CO2: 24 mmol/L (ref 22–32)
Calcium: 9.8 mg/dL (ref 8.9–10.3)
Chloride: 99 mmol/L (ref 98–111)
Creatinine, Ser: 1.08 mg/dL (ref 0.61–1.24)
GFR, Estimated: >60 mL/min (ref >60)
Glucose, Bld: 147 mg/dL — ABNORMAL HIGH (ref 70–99)
Potassium: 3.8 mmol/L (ref 3.5–5.1)
Sodium: 138 mmol/L (ref 135–145)

## 2023-02-09 LAB — CBC
HCT: 43.6 % (ref 39.0–52.0)
Hemoglobin: 15.1 g/dL (ref 13.0–17.0)
MCH: 34.9 pg — ABNORMAL HIGH (ref 26.0–34.0)
MCHC: 34.6 g/dL (ref 30.0–36.0)
MCV: 100.7 fL — ABNORMAL HIGH (ref 80.0–100.0)
Platelets: 209 10*3/uL (ref 150–400)
RBC: 4.33 MIL/uL (ref 4.22–5.81)
RDW: 13.2 % (ref 11.5–15.5)
WBC: 9.3 10*3/uL (ref 4.0–10.5)
nRBC: 0 % (ref 0.0–0.2)

## 2023-02-09 LAB — HEMOGLOBIN A1C
Hgb A1c MFr Bld: 6.2 % — ABNORMAL HIGH (ref 4.8–5.6)
Mean Plasma Glucose: 131.24 mg/dL

## 2023-02-09 LAB — GLUCOSE, CAPILLARY: Glucose-Capillary: 150 mg/dL — ABNORMAL HIGH (ref 70–99)

## 2023-02-10 ENCOUNTER — Encounter (HOSPITAL_COMMUNITY): Payer: Self-pay

## 2023-02-10 NOTE — Anesthesia Preprocedure Evaluation (Addendum)
Anesthesia Evaluation  Patient identified by MRN, date of birth, ID band Patient awake    Reviewed: Allergy & Precautions, NPO status , Patient's Chart, lab work & pertinent test results  Airway Mallampati: II  TM Distance: >3 FB Neck ROM: Full    Dental  (+) Dental Advisory Given   Pulmonary former smoker   breath sounds clear to auscultation       Cardiovascular hypertension, Pt. on medications + dysrhythmias Atrial Fibrillation  Rhythm:Irregular Rate:Normal     Neuro/Psych Seizures -, Well Controlled,  CVA, Residual Symptoms    GI/Hepatic negative GI ROS, Neg liver ROS,,,  Endo/Other  diabetes, Type 2    Renal/GU negative Renal ROS     Musculoskeletal   Abdominal   Peds  Hematology negative hematology ROS (+)   Anesthesia Other Findings   Reproductive/Obstetrics                             Anesthesia Physical Anesthesia Plan  ASA: 3  Anesthesia Plan: MAC   Post-op Pain Management: Minimal or no pain anticipated   Induction:   PONV Risk Score and Plan: 1 and Propofol infusion, Ondansetron and Treatment may vary due to age or medical condition  Airway Management Planned: Natural Airway and Simple Face Mask  Additional Equipment:   Intra-op Plan:   Post-operative Plan:   Informed Consent: I have reviewed the patients History and Physical, chart, labs and discussed the procedure including the risks, benefits and alternatives for the proposed anesthesia with the patient or authorized representative who has indicated his/her understanding and acceptance.       Plan Discussed with: CRNA  Anesthesia Plan Comments: ( )        Anesthesia Quick Evaluation

## 2023-02-10 NOTE — Progress Notes (Signed)
Choose an anesthesia record to view details        DISCUSSION: Donald [name] is a 77 yo male who presents to PAT prior to TRANSANAL HEMORRHOIDAL DEARTERIALIZATION on 02/18/23 with Dr. Maisie Fus. PMH significant for former smoking, hx of CVA (s/p tPA in 2015), A.fib on Eliquis, DM, HTN, HLD.  Patient follows with Cardiology for hx of A.fib. Last seen in clinic on 10/21/22. Noted to be doing well from cardiac standpoint. He is in permanent A.fib and is rate controlled, unaware. 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 Echo updated due to complaints of fatigue and EKG change. Per PA Keitha Butte: "Heart pump remains strong, muscle remains thickened as it has been in the past though described as less so which is good.  Good BP control remains important. Mild reduction in the strength of his RV, this is minimal and can be monitor with echos of the years, no murmurs of any concern"   Patient cleared for surgery 12/29/22:  "Chart reviewed as part of pre-operative protocol coverage. Given past medical history and time since last visit, based on ACC/AHA guidelines, Donald [name] is at acceptable risk for the planned procedure without further cardiovascular testing.    Patient was advised to hold Eliquis two days prior to procedure and should restart post procedure when hemostasis is achieved and guided by surgical team."    VS: BP (!) 156/98 Comment: Did not take B/P med this am  Pulse 81   Temp 36.6 C (Oral)   Resp 16   Ht 6\' 3"  (1.905 m)   Wt 103 kg   SpO2 100%   BMI 28.37 kg/m   PROVIDERS: [name], MD   LABS: Labs reviewed: Acceptable for surgery. (all labs ordered are listed, but only abnormal results are displayed)  Labs Reviewed  HEMOGLOBIN A1C - Abnormal; Notable for the following components:      Result Value   Hgb A1c MFr Bld 6.2 (*)    All other components within normal limits  BASIC METABOLIC PANEL - Abnormal; Notable for the following components:   Glucose, Bld 147 (*)     All other components within normal limits  CBC - Abnormal; Notable for the following components:   MCV 100.7 (*)    MCH 34.9 (*)    All other components within normal limits  GLUCOSE, CAPILLARY - Abnormal; Notable for the following components:   Glucose-Capillary 150 (*)    All other components within normal limits     IMAGES:   EKG 10/21/22  AFib, 79bpm, RBBB (new), PVC     CV  Echo 11/18/22  IMPRESSIONS     1. Left ventricular ejection fraction, by estimation, is 65 to 70%. The  left ventricle has normal function. The left ventricle has no regional  wall motion abnormalities. There is moderate asymmetric left ventricular  hypertrophy of the basal-septal  segment. Left ventricular diastolic parameters are indeterminate.   2. Right ventricular systolic function is mildly reduced. The right  ventricular size is mildly enlarged. Tricuspid regurgitation signal is  inadequate for assessing PA pressure.   3. Left atrial size was mildly dilated.   4. The mitral valve is normal in structure. Trivial mitral valve  regurgitation.   5. The aortic valve is tricuspid. Aortic valve regurgitation is not  visualized. Aortic valve sclerosis is present, with no evidence of aortic  valve stenosis.   6. The inferior vena cava is normal in size with greater than 50%  respiratory variability, suggesting right atrial  pressure of 3 mmHg.      Stress test 06/16/2018  Nuclear stress EF: 52%. No T wave inversion was noted during stress. There was no ST segment deviation noted during stress. This is a low risk study.   No reversible ischemia. LVEF 52% with normal wall motion. This is a low risk study.  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 s/p IV tPA, b. source unknown, c. loop recorder placed   Snoring 07/03/2015     Past Surgical History:  Procedure Laterality Date   LOOP RECORDER IMPLANT  06/07/2013   MDT LinQ implanted by Dr Johney Frame for cryptogenic stroke   LOOP RECORDER IMPLANT N/A 06/07/2013   Procedure: LOOP RECORDER IMPLANT;  Surgeon: Gardiner Rhyme, MD;  Location: [facility] CATH LAB;  Service: Cardiovascular;  Laterality: N/A;   TEE WITHOUT CARDIOVERSION N/A 06/07/2013   Procedure: TRANSESOPHAGEAL ECHOCARDIOGRAM (TEE);  Surgeon: Lars Masson, MD;  Location: [facility] ENDOSCOPY;  Service: Cardiovascular;  Laterality: N/A;   TONSILLECTOMY     UMBILICAL HERNIA REPAIR      MEDICATIONS:  acetaminophen (TYLENOL) 325 MG tablet   AMBULATORY NON FORMULARY MEDICATION   amLODipine (NORVASC) 10 MG tablet   ascorbic acid (VITAMIN C) 500 MG tablet   chlorthalidone (HYGROTON) 25 MG tablet   Cholecalciferol (VITAMIN D-3) 1000 units CAPS   Coenzyme Q10 100 MG capsule   cyanocobalamin (VITAMIN B12) 1000 MCG tablet   ELIQUIS 5 MG TABS tablet   levETIRAcetam (KEPPRA XR) 500 MG 24 hr tablet   lisinopril (PRINIVIL,ZESTRIL) 40 MG tablet   metFORMIN (GLUCOPHAGE-XR) 500 MG 24 hr tablet   Methylcellulose, Laxative, (CITRUCEL) 500 MG TABS   Multiple Vitamins-Minerals (MULTIVITAMIN WITH MINERALS) tablet   Multiple Vitamins-Minerals (PRESERVISION AREDS 2+MULTI VIT PO)   potassium chloride SA (K-DUR,KLOR-CON) 20 MEQ tablet   pravastatin (PRAVACHOL) 10 MG tablet   Probiotic Product (ALIGN) 4 MG CAPS   spironolactone (ALDACTONE) 50 MG tablet   tadalafil (CIALIS) 5 MG tablet   TOBRADEX ophthalmic ointment   vitamin E 180 MG (400 UNITS) capsule   zinc gluconate 50 MG tablet   No current facility-administered medications for this encounter.   Marcille Blanco [facility] Surgical Short Stay/Anesthesiology [facility] Phone [phone] 02/10/2023 2:02 PM

## 2023-02-11 DIAGNOSIS — E1169 Type 2 diabetes mellitus with other specified complication: Secondary | ICD-10-CM | POA: Diagnosis not present

## 2023-02-18 ENCOUNTER — Ambulatory Visit (HOSPITAL_COMMUNITY)
Admission: RE | Admit: 2023-02-18 | Discharge: 2023-02-18 | Disposition: A | Discharge status: Home / Self Care | Payer: Medicare Other | Source: Ambulatory Visit | Attending: General Surgery | Admitting: General Surgery

## 2023-02-18 ENCOUNTER — Ambulatory Visit (HOSPITAL_COMMUNITY): Payer: Self-pay | Admitting: Anesthesiology

## 2023-02-18 ENCOUNTER — Other Ambulatory Visit: Payer: Self-pay

## 2023-02-18 ENCOUNTER — Encounter (HOSPITAL_COMMUNITY)
Admission: RE | Disposition: A | Discharge status: Home / Self Care | Payer: Self-pay | Source: Ambulatory Visit | Attending: General Surgery

## 2023-02-18 ENCOUNTER — Ambulatory Visit (HOSPITAL_COMMUNITY): Payer: Medicare Other | Admitting: Medical

## 2023-02-18 DIAGNOSIS — K641 Second degree hemorrhoids: Secondary | ICD-10-CM | POA: Diagnosis not present

## 2023-02-18 DIAGNOSIS — E119 Type 2 diabetes mellitus without complications: Secondary | ICD-10-CM | POA: Diagnosis not present

## 2023-02-18 DIAGNOSIS — K642 Third degree hemorrhoids: Secondary | ICD-10-CM | POA: Insufficient documentation

## 2023-02-18 DIAGNOSIS — I10 Essential (primary) hypertension: Secondary | ICD-10-CM | POA: Diagnosis not present

## 2023-02-18 DIAGNOSIS — E785 Hyperlipidemia, unspecified: Secondary | ICD-10-CM | POA: Diagnosis not present

## 2023-02-18 DIAGNOSIS — Z86718 Personal history of other venous thrombosis and embolism: Secondary | ICD-10-CM | POA: Diagnosis not present

## 2023-02-18 DIAGNOSIS — Z87891 Personal history of nicotine dependence: Secondary | ICD-10-CM | POA: Diagnosis not present

## 2023-02-18 DIAGNOSIS — Z8673 Personal history of transient ischemic attack (TIA), and cerebral infarction without residual deficits: Secondary | ICD-10-CM | POA: Diagnosis not present

## 2023-02-18 DIAGNOSIS — Z7901 Long term (current) use of anticoagulants: Secondary | ICD-10-CM | POA: Diagnosis not present

## 2023-02-18 DIAGNOSIS — I4819 Other persistent atrial fibrillation: Secondary | ICD-10-CM | POA: Diagnosis not present

## 2023-02-18 HISTORY — PX: TRANSANAL HEMORRHOIDAL DEARTERIALIZATION: SHX6136

## 2023-02-18 LAB — GLUCOSE, CAPILLARY
Glucose-Capillary: 185 mg/dL — ABNORMAL HIGH (ref 70–99)
Glucose-Capillary: 169 mg/dL — ABNORMAL HIGH (ref 70–99)

## 2023-02-18 SURGERY — TRANSANAL HEMORRHOIDAL DEARTERIALIZATION
Anesthesia: Monitor Anesthesia Care | Site: Rectum

## 2023-02-18 MED ORDER — FENTANYL CITRATE (PF) 100 MCG/2ML IJ SOLN
INTRAMUSCULAR | Status: AC
  Filled 2023-02-18: qty 2

## 2023-02-18 MED ORDER — PROPOFOL 500 MG/50ML IV EMUL
INTRAVENOUS | Status: DC | PRN
  Administered 2023-02-18: 125 ug/kg/min via INTRAVENOUS
  Administered 2023-02-18: 40 mg via INTRAVENOUS

## 2023-02-18 MED ORDER — KETAMINE HCL 10 MG/ML IJ SOLN
INTRAMUSCULAR | Status: AC
  Filled 2023-02-18: qty 1

## 2023-02-18 MED ORDER — BUPIVACAINE-EPINEPHRINE (PF) 0.5% -1:200000 IJ SOLN
INTRAMUSCULAR | Status: AC
  Filled 2023-02-18: qty 30

## 2023-02-18 MED ORDER — ONDANSETRON HCL 4 MG/2ML IJ SOLN
INTRAMUSCULAR | Status: DC | PRN
  Administered 2023-02-18: 4 mg via INTRAVENOUS

## 2023-02-18 MED ORDER — DIAZEPAM 2 MG PO TABS: 5.0000 mg | ORAL_TABLET | Freq: Once | ORAL | Status: AC

## 2023-02-18 MED ORDER — BUPIVACAINE LIPOSOME 1.3 % IJ SUSP
INTRAMUSCULAR | Status: AC
  Filled 2023-02-18: qty 20

## 2023-02-18 MED ORDER — BUPIVACAINE-EPINEPHRINE (PF) 0.5% -1:200000 IJ SOLN
INTRAMUSCULAR | Status: DC | PRN
  Administered 2023-02-18: 50 mL

## 2023-02-18 MED ORDER — PHENYLEPHRINE 80 MCG/ML (10ML) SYRINGE FOR IV PUSH (FOR BLOOD PRESSURE SUPPORT)
PREFILLED_SYRINGE | INTRAVENOUS | Status: DC | PRN
Start: 2023-02-18 — End: 2023-02-18
  Administered 2023-02-18: 80 ug via INTRAVENOUS
  Administered 2023-02-18: 40 ug via INTRAVENOUS
  Administered 2023-02-18 (×3): 80 ug via INTRAVENOUS
  Administered 2023-02-18: 160 ug via INTRAVENOUS

## 2023-02-18 MED ORDER — ORAL CARE MOUTH RINSE: 15.0000 mL | Freq: Once | OROMUCOSAL | Status: AC

## 2023-02-18 MED ORDER — FENTANYL CITRATE (PF) 100 MCG/2ML IJ SOLN
INTRAMUSCULAR | Status: DC | PRN
  Administered 2023-02-18: 25 ug via INTRAVENOUS

## 2023-02-18 MED ORDER — PROPOFOL 1000 MG/100ML IV EMUL
INTRAVENOUS | Status: AC
  Filled 2023-02-18: qty 100

## 2023-02-18 MED ORDER — FENTANYL CITRATE PF 50 MCG/ML IJ SOSY: 25.0000 ug | PREFILLED_SYRINGE | INTRAMUSCULAR | Status: DC | PRN

## 2023-02-18 MED ORDER — LIDOCAINE HCL (PF) 2 % IJ SOLN
INTRAMUSCULAR | Status: AC
  Filled 2023-02-18: qty 5

## 2023-02-18 MED ORDER — OXYCODONE HCL 5 MG PO TABS
5.0000 mg | ORAL_TABLET | ORAL | 0 refills | Status: AC | PRN
Start: 2023-02-18 — End: ?

## 2023-02-18 MED ORDER — DIAZEPAM 2 MG PO TABS
ORAL_TABLET | ORAL | Status: AC
  Administered 2023-02-18: 5 mg via ORAL
  Filled 2023-02-18: qty 3

## 2023-02-18 MED ORDER — DIAZEPAM 5 MG PO TABS: 5.0000 mg | ORAL_TABLET | Freq: Four times a day (QID) | ORAL | 0 refills | Status: AC | PRN

## 2023-02-18 MED ORDER — SODIUM CHLORIDE 0.9% FLUSH: 3.0000 mL | Freq: Two times a day (BID) | INTRAVENOUS | Status: DC

## 2023-02-18 MED ORDER — INSULIN ASPART 100 UNIT/ML IJ SOLN
0.0000 [IU] | INTRAMUSCULAR | Status: DC | PRN
  Administered 2023-02-18: 2 [IU] via SUBCUTANEOUS
  Filled 2023-02-18: qty 1

## 2023-02-18 MED ORDER — PHENYLEPHRINE 80 MCG/ML (10ML) SYRINGE FOR IV PUSH (FOR BLOOD PRESSURE SUPPORT)
PREFILLED_SYRINGE | INTRAVENOUS | Status: AC
  Filled 2023-02-18: qty 10

## 2023-02-18 MED ORDER — AMISULPRIDE (ANTIEMETIC) 5 MG/2ML IV SOLN: 10.0000 mg | Freq: Once | INTRAVENOUS | Status: DC | PRN

## 2023-02-18 MED ORDER — BUPIVACAINE LIPOSOME 1.3 % IJ SUSP: 20.0000 mL | Freq: Once | INTRAMUSCULAR | Status: DC

## 2023-02-18 MED ORDER — ONDANSETRON HCL 4 MG/2ML IJ SOLN
INTRAMUSCULAR | Status: AC
  Filled 2023-02-18: qty 2

## 2023-02-18 MED ORDER — CHLORHEXIDINE GLUCONATE 0.12 % MT SOLN
15.0000 mL | Freq: Once | OROMUCOSAL | Status: AC
  Administered 2023-02-18: 15 mL via OROMUCOSAL

## 2023-02-18 MED ORDER — LACTATED RINGERS IV SOLN: INTRAVENOUS | Status: DC

## 2023-02-18 MED ORDER — SODIUM CHLORIDE (PF) 0.9 % IJ SOLN
INTRAMUSCULAR | Status: AC
  Filled 2023-02-18: qty 10

## 2023-02-18 MED ORDER — 0.9 % SODIUM CHLORIDE (POUR BTL) OPTIME
TOPICAL | Status: DC | PRN
Start: 2023-02-18 — End: 2023-02-18
  Administered 2023-02-18: 1000 mL

## 2023-02-18 SURGICAL SUPPLY — 31 items
BAG COUNTER SPONGE SURGICOUNT (BAG) IMPLANT
BAG SPNG CNTER NS LX DISP (BAG)
BLADE HEX COATED 2.75 (ELECTRODE) ×1 IMPLANT
BRIEF MESH DISP LRG (UNDERPADS AND DIAPERS) IMPLANT
ELECT REM PT RETURN 15FT ADLT (MISCELLANEOUS) ×1 IMPLANT
GAUZE 4X4 16PLY ~~LOC~~+RFID DBL (SPONGE) ×1 IMPLANT
GAUZE PAD ABD 7.5X8 STRL (GAUZE/BANDAGES/DRESSINGS) IMPLANT
GAUZE PAD ABD 8X10 STRL (GAUZE/BANDAGES/DRESSINGS) IMPLANT
GAUZE SPONGE 4X4 12PLY STRL (GAUZE/BANDAGES/DRESSINGS) IMPLANT
GLOVE BIO SURGEON STRL SZ 6.5 (GLOVE) ×1 IMPLANT
GLOVE INDICATOR 6.5 STRL GRN (GLOVE) ×2 IMPLANT
GOWN STRL REUS W/ TWL XL LVL3 (GOWN DISPOSABLE) ×2 IMPLANT
GOWN STRL REUS W/TWL XL LVL3 (GOWN DISPOSABLE) ×2
HEMOSTAT SURGICEL 4X8 (HEMOSTASIS) IMPLANT
KIT SLIDE ONE PROLAPS HEMORR (KITS) IMPLANT
KIT TURNOVER KIT A (KITS) IMPLANT
NDL HYPO 22X1.5 SAFETY MO (MISCELLANEOUS) ×1 IMPLANT
NEEDLE HYPO 22X1.5 SAFETY MO (MISCELLANEOUS) ×1
PACK LITHOTOMY IV (CUSTOM PROCEDURE TRAY) ×1 IMPLANT
PENCIL SMOKE EVACUATOR (MISCELLANEOUS) IMPLANT
SPIKE FLUID TRANSFER (MISCELLANEOUS) ×1 IMPLANT
SPONGE HEMORRHOID 8X3CM (HEMOSTASIS) IMPLANT
SPONGE SURGIFOAM ABS GEL 100 (HEMOSTASIS) IMPLANT
SURGILUBE 2OZ TUBE FLIPTOP (MISCELLANEOUS) ×1 IMPLANT
SUT CHROMIC 2 0 SH (SUTURE) IMPLANT
SUT CHROMIC 3 0 SH 27 (SUTURE) IMPLANT
SUT VIC AB 2-0 UR6 27 (SUTURE) IMPLANT
SYR 20ML LL LF (SYRINGE) ×1 IMPLANT
TOWEL OR 17X26 10 PK STRL BLUE (TOWEL DISPOSABLE) ×1 IMPLANT
TOWEL OR NON WOVEN STRL DISP B (DISPOSABLE) ×1 IMPLANT
YANKAUER SUCT BULB TIP 10FT TU (MISCELLANEOUS) IMPLANT

## 2023-02-18 NOTE — Discharge Instructions (Addendum)
ANORECTAL SURGERY: POST OP INSTRUCTIONS ?Take your usually prescribed home medications unless otherwise directed. ?DIET: During the first few hours after surgery sip on some liquids until you are able to urinate.  It is normal to not urinate for several hours after this surgery.  If you feel uncomfortable, please contact the office for instructions.  After you are able to urinate,you may eat, if you feel like it.  Follow a light bland diet the first 24 hours after arrival home, such as soup, liquids, crackers, etc.  Be sure to include lots of fluids daily (6-8 glasses).  Avoid fast food or heavy meals, as your are more likely to get nauseated.  Eat a low fat diet the next few days after surgery.  Limit caffeine intake to 1-2 servings a day. ?PAIN CONTROL: ?Pain is best controlled by a usual combination of several different methods TOGETHER: ?Muscle relaxation ? Soak in a warm bath (or Sitz bath) three times a day and after bowel movements.  Continue to do this until all pain is resolved. ?Take the muscle relaxer (Valium) every 6 hours for the first 2 days after surgery  ?Over the counter pain medication ?Prescription pain medication ?Most patients will experience some swelling and discomfort in the anus/rectal area and incisions.  Heat such as warm towels, sitz baths, warm baths, etc to help relax tight/sore spots and speed recovery.  Some people prefer to use ice, especially in the first couple days after surgery, as it may decrease the pain and swelling, or alternate between ice & heat.  Experiment to what works for you.  Swelling and bruising can take several weeks to resolve.  Pain can take even longer to completely resolve. ?It is helpful to take an over-the-counter pain medication regularly for the first few weeks.  Choose one of the following that works best for you: ?Naproxen (Aleve, etc)  Two 220mg  tabs twice a day ?Ibuprofen (Advil, etc) Three 200mg  tabs four times a day (every meal & bedtime) ?A   prescription for pain medication (such as percocet, oxycodone, hydrocodone, etc) should be given to you upon discharge.  Take your pain medication as prescribed.  ?If you are having problems/concerns with the prescription medicine (does not control pain, nausea, vomiting, rash, itching, etc), please call us [phone] to see if we need to switch you to a different pain medicine that will work better for you and/or control your side effect better. ?If you need a refill on your pain medication, please contact your pharmacy.  They will contact our office to request authorization. Prescriptions will not be filled after 5 pm or on week-ends. ?KEEP YOUR BOWELS REGULAR and AVOID CONSTIPATION ?The goal is one to two soft bowel movements a day.  You should at least have a bowel movement every other day. ?Avoid getting constipated.  Between the surgery and the pain medications, it is common to experience some constipation. This can be very painful after rectal surgery.  Increasing fluid intake and taking a fiber supplement (such as Metamucil, Citrucel, FiberCon, etc) 1-2 times a day regularly will usually help prevent this problem from occurring.  A stool softener like colace is also recommended.  This can be purchased over the counter at your pharmacy.  You can take it up to 3 times a day.  If you do not have a bowel movement after 24 hrs since your surgery, take one does of milk of magnesia.  If you still haven't had a bowel movement 8-12 hours after  that dose, take another dose.  If you don't have a bowel movement 48 hrs after surgery, purchase a Fleets enema from the drug store and administer gently per package instructions.  If you still are having trouble with your bowel movements after that, please call the office for further instructions. ?If you develop diarrhea or have many loose bowel movements, simplify your diet to bland foods & liquids for a few days.  Stop any stool softeners and decrease your fiber  supplement.  Switching to mild anti-diarrheal medications (Kayopectate, Pepto Bismol) can help.  If this worsens or does not improve, please call us. ? ?Wound Care ?Remove your bandages before your first bowel movement or 8 hours after surgery.     ?Remove any wound packing material at this tim,e as well.  You do not need to repack the wound unless instructed otherwise.  Wear an absorbent pad or soft cotton gauze in your underwear to catch any drainage and help keep the area clean. You should change this every 2-3 hours while awake. ?Keep the area clean and dry.  Bathe / shower every day, especially after bowel movements.  Keep the area clean by showering / bathing over the incision / wound.   It is okay to soak an open wound to help wash it.  Wet wipes or showers / gentle washing after bowel movements is often less traumatic than regular toilet paper. ?You may have some styrofoam-like soft packing in the rectum which will come out with the first bowel movement.  ?You will often notice bleeding with bowel movements.  This should slow down by the end of the first week of surgery ?Expect some drainage.  This should slow down, too, by the end of the first week of surgery.  Wear an absorbent pad or soft cotton gauze in your underwear until the drainage stops. ?Do Not sit on a rubber or pillow ring.  This can make you symptoms worse.  You may sit on a soft pillow if needed.  ?ACTIVITIES as tolerated:   ?You may resume regular (light) daily activities beginning the next day--such as daily self-care, walking, climbing stairs--gradually increasing activities as tolerated.  If you can walk 30 minutes without difficulty, it is safe to try more intense activity such as jogging, treadmill, bicycling, low-impact aerobics, swimming, etc. ?Save the most intensive and strenuous activity for last such as sit-ups, heavy lifting, contact sports, etc  Refrain from any heavy lifting or straining until you are off narcotics for pain  control.   ?You may drive when you are no longer taking prescription pain medication, you can comfortably sit for long periods of time, and you can safely maneuver your car and apply brakes. ?You may have sexual intercourse when it is comfortable.  ?FOLLOW UP in our office ?Please call [facility] at [phone] to set up an appointment to see your surgeon in the office for a follow-up appointment approximately 3-4 weeks after your surgery. ?Make sure that you call for this appointment the day you arrive home to insure a convenient appointment time. ?10. IF YOU HAVE DISABILITY OR FAMILY LEAVE FORMS, BRING THEM TO THE OFFICE FOR PROCESSING.  DO NOT GIVE THEM TO YOUR DOCTOR. ? ? ? ? ?WHEN TO CALL us [phone]: ?Poor pain control ?Reactions / problems with new medications (rash/itching, nausea, etc)  ?Fever over 101.5 F (38.5 C) ?Inability to urinate ?Nausea and/or vomiting ?Worsening swelling or bruising ?Continued bleeding from incision. ?Increased pain, redness, or drainage from the  incision ? ?The clinic staff is available to answer your questions during regular business hours (8:30am-5pm).  Please don?t hesitate to call and ask to speak to one of our nurses for clinical concerns.   A surgeon from [facility] is always on call at the hospitals ?  ?If you have a medical emergency, go to the nearest emergency room or call 911. ?  ? ?[facility], [place] ?[address], [place], [place]  [address] ? ?MAIN: [phone] ? TOLL FREE: [phone] ? ?FAX [phone] ?[email or web address] ? ?  ?

## 2023-02-18 NOTE — H&P (Signed)
REFERRING PHYSICIAN:  Blenda Bridegroom, MD   PROVIDER:  Elenora Gamma, MD   MRN: [id number] DOB: 05/03/1946    Subjective    Chief Complaint: Hemorrhoids       History of Present Illness: Donald [name] is a 77 y.o. male who is seen today as an office consultation at the request of Dr. Kateri [name] for evaluation of Hemorrhoids .   77 year old male on Eliquis after stroke 2 years ago.  He is developed rectal bleeding that is becoming more more prominent.  He has tried Anusol suppositories and Proctofoam with no long-term success.  He has soft bowel movements and denies constipation or straining.  He reports spontaneous bleeding during the night as well as after bowel movements.     Review of Systems: A complete review of systems was obtained from the patient.  I have reviewed this information and discussed as appropriate with the patient.  See HPI as well for other ROS.       Medical History: Past Medical History         Past Medical History:  Diagnosis Date   Diabetes mellitus without complication (CMS/HHS-HCC)     DVT (deep venous thrombosis) (CMS/HHS-HCC)     History of stroke     Hypertension          Problem List       Patient Active Problem List  Diagnosis   CVA (cerebral vascular accident) (CMS/HHS-HCC)   Diabetes (CMS/HHS-HCC)        Past Surgical History           Past Surgical History:  Procedure Laterality Date   HERNIA REPAIR            Allergies         Allergies  Allergen Reactions   Bitolterol Other (See Comments)        Medications Ordered Prior to Encounter             Current Outpatient Medications on File Prior to Visit  Medication Sig Dispense Refill   amLODIPine (NORVASC) 10 MG tablet TAKE 1 TABLET BY MOUTH EVERY DAY FOR 30 DAYS for 90 days       apixaban (ELIQUIS) 2.5 mg tablet Take by mouth       ascorbic acid, vitamin C, (VITAMIN C) 1000 MG tablet Take 1,000 mg by mouth once daily       chlorthalidone 25 MG tablet  TAKE 1 TABLET BY MOUTH EVERY DAY THE MORNING WITH FOOD 90 DAYS       cholecalciferol (VITAMIN D3) 1000 unit capsule Take 1,000 Units by mouth once daily       cyanocobalamin (VITAMIN B12) 1000 MCG tablet Take 1,000 mcg by mouth once daily       KLOR-CON M20 20 mEq ER tablet Take 20 mEq by mouth 2 (two) times daily       levETIRAcetam (KEPPRA) 500 MG tablet Take by mouth daily       lisinopriL (ZESTRIL) 40 MG tablet Take 40 mg by mouth once daily       multivitamin with minerals Cap Take 1 capsule by mouth once daily       pravastatin (PRAVACHOL) 10 MG tablet Take 10 mg by mouth once daily       spironolactone (ALDACTONE) 25 MG tablet Take 25 mg by mouth once daily       tadalafiL (CIALIS) 5 MG tablet Take 5 mg by mouth once daily as needed  vitamin E 400 UNIT capsule Take by mouth       zinc gluconate 50 mg tablet Take 50 mg by mouth once daily        No current facility-administered medications on file prior to visit.        Family History           Family History  Problem Relation Age of Onset   Diabetes Mother     High blood pressure (Hypertension) Father     Coronary Artery Disease (Blocked arteries around heart) Father          Tobacco Use History  Social History           Tobacco Use  Smoking Status Former   Types: Cigarettes  Smokeless Tobacco Never        Social History  Social History             Socioeconomic History   Marital status: Married  Tobacco Use   Smoking status: Former      Types: Cigarettes   Smokeless tobacco: Never  Substance and Sexual Activity   Alcohol use: Not Currently   Drug use: Not Currently        Objective:      Vitals:   02/18/23 0639  BP: 133/79  Pulse: 81  Resp: 16  Temp: 98.8 F (37.1 C)  SpO2: 100%    Exam Gen: NAD CV: RRR Lungs: CTA Abd: soft Rectal: Slightly decreased rectal tone, no external pathology noted. skin tags present     Labs, Imaging and Diagnostic Testing:   Procedure: Anoscopy  12/22/2022 Surgeon: Donald [name] After the risks and benefits were explained, written consent was obtained for above procedure.  A medical assistant chaperone was present thoroughout the entire procedure.  Anesthesia: none Diagnosis: rectal bleeding Findings: Grade 2 hemorrhoids at all 3 locations with inflammation   Assessment and Plan:  Diagnoses and all orders for this visit:   Grade II hemorrhoids   77 year old male with rectal inflammation and grade 2 hemorrhoids.  We discussed performing rubber band ligation here in the office over the next several months versus hemorrhoidopexy in the operating room.  We discussed details of both procedures.  We discussed the risk of anesthesia with the surgical procedure.  All questions were answered.  Patient decided to proceed with [facility].  Cardiac clearance obtained.  Will hold Elliquis for 2 days preop.         Donald Panda, MD Colon and Rectal Surgery [facility]

## 2023-02-18 NOTE — Anesthesia Postprocedure Evaluation (Signed)
Anesthesia Post Note  Patient: Donald [name]  Procedure(s) Performed: TRANSANAL HEMORRHOIDAL DEARTERIALIZATION (Rectum)     Patient location during evaluation: PACU Anesthesia Type: MAC Level of consciousness: awake and alert Pain management: pain level controlled Vital Signs Assessment: post-procedure vital signs reviewed and stable Respiratory status: spontaneous breathing, nonlabored ventilation, respiratory function stable and patient connected to nasal cannula oxygen Cardiovascular status: stable and blood pressure returned to baseline Postop Assessment: no apparent nausea or vomiting Anesthetic complications: no  No notable events documented.  Last Vitals:  Vitals:   02/18/23 1014 02/18/23 1132  BP: (!) 142/91 (!) 142/85  Pulse: 62 67  Resp:  14  Temp: 36.5 C 36.6 C  SpO2: 94% 95%    Last Pain:  Vitals:   02/18/23 1132  TempSrc: Oral  PainSc: 0-No pain                 Kennieth Rad

## 2023-02-18 NOTE — Op Note (Signed)
02/18/2023  9:13 AM  PATIENT:  Donald [name]  77 y.o. male  Patient Care Team: [name], MD as PCP - General (Family Medicine) [name], MD (Inactive) as PCP - Cardiology (Cardiology)  PRE-OPERATIVE DIAGNOSIS:  grade 2 hemorrhoids bleeding  POST-OPERATIVE DIAGNOSIS:  grade 3 hemorrhoids bleeding  PROCEDURE:  TRANSANAL HEMORRHOIDAL DEARTERIALIZATION   Surgeon(s): Romie Levee, MD  ASSISTANT: none   ANESTHESIA:   local and MAC  EBL:  Total I/O In: 700 [I.V.:700] Out: 15 [Blood:15]  DRAINS: none   SPECIMEN:  No Specimen  DISPOSITION OF SPECIMEN:  N/A  COUNTS:  YES  PLAN OF CARE: Discharge to home after PACU  PATIENT DISPOSITION:  PACU - hemodynamically stable.  INDICATION: 77 y.o. M with bleeding hemorrhoids   OR FINDINGS: Grade 2 RA and LL hemorrhoids, Grade 3 RP  Description: Informed consent was confirmed. Patient underwent general anesthesia without difficulty. Patient was placed into lithotomy positioning.  The perianal region was prepped and draped in sterile fashion. Surgical time out confirmed or plan.  I did digital rectal examination and then transitioned over to anoscopy to get a sense of the anatomy.  I switched over to the [facility] fiberoptically lit Doppler anocope.   Using the Doppler on the tip of the THD anoscope, I identified the arterial hemorrhoidal vessels coming in in the classic hexagonal anatomical pattern (right posterior/lateral/anterior, left posterior /lateral/anterior).    I proceeded to ligate the hemorrhoidal arteries. I used a 2-0 Vicryl suture on a UR-6 needle in a figure-of-eight fashion over the signal around 6 cm proximal to the anal verge. I then ran that stitch longitudinally more distally to the dentate line. I then tied that stitch down to cause a hemorrhoidopexy. I did that for all 6 locations.    I redid Doppler anoscopy. I Identified a signal at the R posterior location.  I isolated and ligated this with a  figure-of-eight stitch. Signals went away.  At completion of this, all hemorrhoids were reduced into the rectum.  There is no more prolapse. External anatomy looked normal.  I repeated anoscopy and examination.   Hemostasis was good. Patient is being extubated go to recovery room.  I am about to discuss the patient's status to the family.     Vanita Panda, MD  Colorectal and General Surgery [facility]

## 2023-02-18 NOTE — Transfer of Care (Signed)
Immediate Anesthesia Transfer of Care Note  Patient: Donald [name]  Procedure(s) Performed: TRANSANAL HEMORRHOIDAL DEARTERIALIZATION (Rectum)  Patient Location: PACU  Anesthesia Type:MAC  Level of Consciousness: drowsy and patient cooperative  Airway & Oxygen Therapy: Patient Spontanous Breathing and Patient connected to face mask oxygen  Post-op Assessment: Report given to RN and Post -op Vital signs reviewed and stable  Post vital signs: Reviewed and stable  Last Vitals:  Vitals Value Taken Time  BP 113/64 02/18/23 0917  Temp    Pulse 94 02/18/23 0918  Resp 17 02/18/23 0918  SpO2 99 % 02/18/23 0918  Vitals shown include unfiled device data.  Last Pain:  Vitals:   02/18/23 0704  TempSrc:   PainSc: 0-No pain         Complications: No notable events documented.

## 2023-02-19 ENCOUNTER — Encounter (HOSPITAL_COMMUNITY): Payer: Self-pay | Admitting: General Surgery

## 2023-03-02 DIAGNOSIS — I739 Peripheral vascular disease, unspecified: Secondary | ICD-10-CM | POA: Diagnosis not present

## 2023-03-02 DIAGNOSIS — L84 Corns and callosities: Secondary | ICD-10-CM | POA: Diagnosis not present

## 2023-03-02 DIAGNOSIS — B351 Tinea unguium: Secondary | ICD-10-CM | POA: Diagnosis not present

## 2023-03-02 DIAGNOSIS — L603 Nail dystrophy: Secondary | ICD-10-CM | POA: Diagnosis not present

## 2023-03-02 DIAGNOSIS — E1151 Type 2 diabetes mellitus with diabetic peripheral angiopathy without gangrene: Secondary | ICD-10-CM | POA: Diagnosis not present

## 2023-03-17 DIAGNOSIS — L603 Nail dystrophy: Secondary | ICD-10-CM | POA: Diagnosis not present

## 2023-03-26 ENCOUNTER — Other Ambulatory Visit: Payer: Self-pay | Admitting: Neurology

## 2023-04-09 ENCOUNTER — Other Ambulatory Visit: Payer: Self-pay | Admitting: Neurology

## 2023-05-19 DIAGNOSIS — L851 Acquired keratosis [keratoderma] palmaris et plantaris: Secondary | ICD-10-CM | POA: Diagnosis not present

## 2023-05-19 DIAGNOSIS — I739 Peripheral vascular disease, unspecified: Secondary | ICD-10-CM | POA: Diagnosis not present

## 2023-05-19 DIAGNOSIS — B351 Tinea unguium: Secondary | ICD-10-CM | POA: Diagnosis not present

## 2023-05-19 DIAGNOSIS — L603 Nail dystrophy: Secondary | ICD-10-CM | POA: Diagnosis not present

## 2023-05-19 DIAGNOSIS — L84 Corns and callosities: Secondary | ICD-10-CM | POA: Diagnosis not present

## 2023-05-19 DIAGNOSIS — E1151 Type 2 diabetes mellitus with diabetic peripheral angiopathy without gangrene: Secondary | ICD-10-CM | POA: Diagnosis not present

## 2023-05-29 ENCOUNTER — Other Ambulatory Visit: Payer: Self-pay | Admitting: Neurology

## 2023-06-04 ENCOUNTER — Other Ambulatory Visit: Payer: Self-pay | Admitting: Physician Assistant

## 2023-06-04 DIAGNOSIS — I4819 Other persistent atrial fibrillation: Secondary | ICD-10-CM

## 2023-06-04 NOTE — Telephone Encounter (Signed)
 Prescription refill request for Eliquis received. Indication: Afib  Last office visit: 10/21/22 Keitha Butte)  Scr: 1.08 (02/09/23)  Age: 78 Weight: 103kg  Appropriate dose. Refill sent.

## 2023-06-09 ENCOUNTER — Other Ambulatory Visit: Payer: Self-pay | Admitting: Neurology

## 2023-06-18 ENCOUNTER — Other Ambulatory Visit: Payer: Self-pay | Admitting: Neurology

## 2023-06-18 DIAGNOSIS — Z1211 Encounter for screening for malignant neoplasm of colon: Secondary | ICD-10-CM | POA: Diagnosis not present

## 2023-06-22 ENCOUNTER — Other Ambulatory Visit: Payer: Self-pay | Admitting: Neurology

## 2023-06-22 ENCOUNTER — Telehealth: Payer: Self-pay | Admitting: *Deleted

## 2023-06-22 NOTE — Telephone Encounter (Signed)
Please call patient and schedule follow up with with provider. So we can continue to refill Keppra for patient.

## 2023-06-26 ENCOUNTER — Other Ambulatory Visit: Payer: Self-pay | Admitting: Neurology

## 2023-06-26 MED ORDER — LEVETIRACETAM ER 500 MG PO TB24: 1000.0000 mg | ORAL_TABLET | Freq: Every day | ORAL | 0 refills | Status: DC

## 2023-06-29 NOTE — Telephone Encounter (Signed)
Below noted.

## 2023-06-29 NOTE — Telephone Encounter (Signed)
Called and LVM for pt to call back and schedule appt.

## 2023-06-30 ENCOUNTER — Encounter: Payer: Self-pay | Admitting: Adult Health

## 2023-06-30 ENCOUNTER — Ambulatory Visit (INDEPENDENT_AMBULATORY_CARE_PROVIDER_SITE_OTHER): Payer: Medicare Other | Admitting: Adult Health

## 2023-06-30 VITALS — BP 141/74 | HR 72 | Ht 75.0 in | Wt 230.0 lb

## 2023-06-30 DIAGNOSIS — R569 Unspecified convulsions: Secondary | ICD-10-CM

## 2023-06-30 DIAGNOSIS — I69354 Hemiplegia and hemiparesis following cerebral infarction affecting left non-dominant side: Secondary | ICD-10-CM | POA: Diagnosis not present

## 2023-06-30 MED ORDER — LEVETIRACETAM ER 500 MG PO TB24: 1000.0000 mg | ORAL_TABLET | Freq: Every day | ORAL | 3 refills | Status: AC

## 2023-06-30 NOTE — Patient Instructions (Signed)
Should he or his family wish to pursue a formalized driving evaluation, they would be encouraged to contact [employer] in [place], [place] at [phone]. Another option would be through [facility]; however, the latter would likely require a referral from a medical doctor. [facility] can be reached directly at [phone].   Local Driver Evaluation Programs:   Comprehensive Evaluation: includes clinical and in vehicle behind the wheel testing by OCCUPATIONAL THERAPIST. Programs have varying levels of adaptive controls available for trial.   [employer], [place] [address] [place], [place]  [address] [phone] or [phone] [email or web address].driver-rehab.com Evaluator:  Elvera Lennox, OT/CDRS/CDI/SCDCM/Low Vision Certification   [facility] [address] [place], [place] [address] [phone] [email or web address] Evaluators:  Rockwell Alexandria, OT and Yves Dill, OT   [facility]  (ONLY SERVES VETERANS!!) [facility] [address] [place], [place]  [address] [phone] [email or web address] Evaluators:  Randall Hiss, KT; Rayburn Felt, KT;  Sheran Luz, KT (KT=kiniesotherapist)     Clinical evaluations only:  Includes clinical testing, refers to other programs or local certified driving instructor for behind the wheel testing.   [facility] (outpatient Rehab) Medical Okey Dupre [address] [place], [place] [address] [phone] for scheduling [email or web address] Evaluators:  Steward Drone, OT; Delma Post, OT   Other area clinical evaluators available upon request including [facility], [facility] and [facility].                                           Resource List What is a Industrial/product designer: Your Road Ahead - A Guide to [employer] Evaluations http://www.thehartford.com/resources/mature-market-excellence/publications-on-aging   Association for Academic librarian - Disability and Driving Fact Sheets http://www.aded.net/?page=510   Driving after a Brain Injury: Brain Injury Association of America VCShow.co.za?A=SearchResult&SearchID=9495675&ObjectID=2758842&ObjectType=35   Driving with Adaptive Equipment: Driver Rehabilitation Services Process http://www.driver-rehab.com/adaptive-equipment   [employer] Equipment [employer]ers Association https://aguirre-arnold.org/ Driver Rehab Services - Meeting the [employer] of Aging, Injured, and Disabled Drivers A post on [employer] provided by: https://driver-rehab.com

## 2023-06-30 NOTE — Progress Notes (Signed)
PATIENT: Donald [name] DOB: [birth year]  REASON FOR VISIT: follow up HISTORY FROM: patient PRIMARY NEUROLOGIST: Dr. Pearlean Brownie  Chief Complaint  Patient presents with   Follow-up    Patient in room #19 with his wife. Patient states he here today for follow for his Medication management.     HISTORY OF PRESENT ILLNESS:  HISTORY    07/09/15:Mr. Donald [name]  Is a 78 year old male with a history of stroke and seizures. He returns today for follow-up. The patient was switched to Eliquis by his cardiologist after he was found to have atrial fibrillation in October. He is tolerating this medication well. The patient's blood pressure is elevated slightly today however this is consistent with every time he comes in to the doctor's office. He states that he has whitecoat syndrome. His primary care is managing his cholesterol  And diabetes. He denies any additional strokelike symptoms. He does not have any weakness from the stroke but does feel like his reaction time on the left side is slower. He uses a cane when  Ambulating.  The patient states that he has a hard time with  ambulatingup or down steps. He is wondering if physical therapy would be beneficial. He continues to use Keppra XR thousand milligrams at bedtime. He is not had any additional seizure events. He denies any new neurological symptoms. He returns today for an evaluation.   HISTORY 01/08/15: Mr. Donald [name] is a 78 year old male with a history of ischemic stroke and seizures. He returns today for follow-up. The patient continues to take Plavix for stroke prevention. The patient's primary care manages his blood pressure, cholesterol and diabetes. Patient states that his blood pressure is slightly elevated today. However he relates this to "white coat syndrome." Patient states that he had a seizure in April at that time he was placed on Keppra XR 1000 mg at bedtime. He states that since then he has not had any seizures. He does not operate a  motor vehicle. He is able to complete all ADLs independently. Patient states that after the stroke he has had some numbness in the bottom of the left foot and that affects his balance. He continues to use a cane when ambulating. Denies any recent falls. Denies any new neurological symptoms. He returns today for an evaluation.   HISTORY 07/05/14:Mr. Donald [name] is a 77 year old male with a history of stroke on 06/03/13. He returns today for follow-up. He is currently taking Plavix for stroke prevention. His HTN, cholesterol and Diabetes is managed by his PCP. He states that his BP has been controlled. When he checks it at home it is usually 130s/70s.  His states that his last hemoglobin A1C was 5.9 % . He continues to take metformin- his dosage has recently been decreased. His cholesterol has been in normal range according to the patient and therefore medication has not been required. Patient will use a cane when he is out in public. He does not use it at home. He feels that his walking has improved. Denies any falls. He notes that his fine motor skills in the left hand has improved. He states that he is now able to cook at home. He does have some sensory changes in the left hand and foot. Denies any new symptoms.     HISTORY 12/20/13 (Donald [name]): is an 78 y.o. male who was visiting here for the holidays on 06/03/2013.  When he got up he was normal. As he was returning to bed began to be short  of breath. Patient then was noted to have slurred speech by his wife and she noted a facial droop. Patient seemed to be weak on the left a well. EMS was called and the patient was brought in as a code stroke. Initial NIHSS of 8. Patient was given TPA. No intervenable lesion was seen on CTA. He was admitted to the neuro ICU for further evaluation and treatment.  CT angio head showed Subtle attenuation of the distal right superior MCA territory branch vessels as compared to the left. No proximal branch occlusion or high-grade  flow-limiting stenosis identified within the right middle cerebral artery proximally.  Otherwise unremarkable CTA of the head without evidence of occlusion, high-grade stenosis, or aneurysm elsewhere within the brain.   CT angio neck was a normal CTA of the neck without evidence of high-grade stenosis, occlusion, or dissection. 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  MRI of the brain showed an acute large right middle cerebral artery territory infarct. Minimal underlying petechial hemorrhage without lobar hematoma. 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 Minimal additional white matter changes suggest chronic small vessel ischemic disease. Acute on chronic paranasal sinusitis. 2D Echocardiogram with the estimated ejection fraction was in the range of 60% to 65%. He was admitted to [facility] 06/07/2013 through 07/02/2013. He is completed home physical therapy and occupational therapy and is about to start going to outpatient neuro rehabilitation.  He is making good progress with his rehabilitation and is very determined.  He and his wife have moved in with her daughter here in [place]. He has established care with Dr. Kateri Plummer and is seeing him every 3 weeks for adjustments to his hypertensive medications, as his hypertension has been very difficult to control since his 30s.  He thinks he forgot to take his blood pressure medicine the day he had the stroke.  His blood pressure is elevated office today at 161/90, he is tolerating Plavix well without any significant bruising or bleeding. He is able to ambulate short distances with a hemiwalker and AFO brace on the left. He is having some spasticity in the left arm with clonus at times.      Update 07/14/2016 : He returns for follow-up after last visit 6 months ago. He is a complaint by his wife. He states is doing well without recurrent stroke or TIA symptoms. He remains on eliquis which is  tolerating well without bleeding or bruising. He states his blood pressure is usually well controlled at home in the 1:30 systolic range but does tend to go up when he visits physicians. Today it is 151/90 in office. He states his diabetes is well controlled and last him about an A1c was 5.9. He does not check his fasting sugars. He is tolerating Keppra XR 1000 mg daily well without side effects and states he is not had any seizures for more than 2 years. He still has some difficulty walking but wears an AFO and walks with stiffness of his left leg. He is a height but no falls or injuries. He has not had any follow-up carotid ultrasound done for more than 2 years. Updated 07/15/2018 : He returns for follow-up after last visit 2 years ago.  Continues to do well and has not had recurrent stroke or TIA symptoms since 2015.  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  He walks with a cane with stiffness of his left leg.  He has had no falls  or injuries.  He has had no recurrent stroke or TIA symptoms.  He remains on Eliquis which is tolerating well with only minor bruising.  He states his fasting sugars have all been good and last hemoglobin A1c was 6.8.  His blood pressure is well controlled though it is slightly elevated in office today at 144/82.  He has not had any seizures since 2016 and remains on Keppra XR 1 gm at night and is tolerating it well without any side effects.  He is considering having penile implant surgery and has already met with cardiologist Dr. Johney Frame and got cardiac clearance.  He underwent medical stress test which went fine.  He has no new complaints today. Update 07/25/2019 : He returns for follow-up after last visit a year ago.  Continues to do well and has not had any recurrent stroke or TIA symptoms since his original stroke in 2015.  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  He remains on Eliquis which is tolerating well without  bruising or bleeding.  Is also tolerating Lipitor 10 mg well without muscle aches or pains.  His blood pressure is usually in the 130s at home though today it is slightly elevated in office at 147/97.  He did have a follow-up carotid ultrasound done after last visit on 07/26/2018 which showed no significant extracranial carotid stenosis on either side.  Patient states he may be under stress recently since his 40 year old mother-in-law has moved into live with him and his wife for the last 1 year.  He feels at times he is having some tics around his lips but he thinks this may be due to anxiety or stress.  This happens only intermittently and is not bothersome. Update 07/24/2020: He returns for follow-up after last visit a year ago.  Continues to do well and has not had any any recurrent seizures or strokes or TIA symptoms.  Is tolerating Keppra well without any side effects.  He remains on Eliquis which is tolerating well without any bleeding and only minor bruising.  Blood pressure is quite well controlled at home though today it is elevated in office at 155/99.  His diabetes control is good and last A1c was 7.1.  He has gained about 8 pounds and is trying to lose weight.  He remains on Pravachol which is tolerating well without side effects and last lipid profile was satisfactory 3 months ago.  He is recently started doing some outpatient therapy to improve his walking and balance.  He has no new complaints today. Update 07/25/2020 : Patient returns for follow-up after last visit a year ago.  Continues to do well and has not had any recurrent stroke or TIA symptoms now since January 2015.  He has had no seizures as well since her original seizure in 2016.  He remains on Keppra XR 1000 mg at night which is tolerating well without side effects.  He is reluctant to consider reducing the medication and says been seizure-free for so long.  He remains on Eliquis for his A-fib and is tolerating well without bleeding or  bruising or other side effects.  States his blood pressure is usually under good control though today it is slightly elevated in office at 149/73.  He gets his A1c and lipid profile checked every 3 months by his primary physician Dr. [name] and last A1c was 7.1 but it does fluctuate between 6.7-7.1 every 3 months.  He is states that his cholesterol has  been under good control though I do not have actual reports to review today.  Patient states he is careful with his walking and does still use a cane.  He still has stiffness and slight foot drop in his left leg.  He had a couple of falls around Christmas and he fell backwards and is carrying objects in his hand and was not careful and his foot got stuck.  He has had no recent falls and is doing better.  Carotid ultrasound on 08/10/2020 showed no significant extracranial stenosis.  2D echo on 08/17/2020 showed normal ejection fraction without cardiac source of embolism.  Mr. Grandison is a 78 year old male with a history of stroke and seizures. He returns today for follow-up. He continues on Eliquis and is tolerating it well. His blood pressure is slightly elevated today. His primary care is managing his hypertension, hyperlipidemia and diabetes. He reports overall he is doing well. He uses a cane when ambulating. Went to PT had good benefit. Using AFO on brace on the left.Denies any falls. He continues on Keppra XR1000 mg at bedtime. He denies any seizure events. He operates a Librarian, academic without difficulty. Patient is asking about driving. He returns today for an evaluation.    Today 06/30/23  Donald [name] is a 78 y.o. male who has been followed in this office for Stroke and seizures. Returns today for follow-up. Denies any additional stroke like symptoms. No seizures. Continues on Keppra extended release of 1000 mg daily.  He states that when he had his stroke in 2015 he did not resume driving.  He states that he primarily did this because his  wife and kids were concerned about him driving.  However he now states that maybe he wants to reinstate his driver license to have some autonomy if he needs to go somewhere.  Although he does state that he does not feel comfortable driving long distances.  Wife feels like he would benefit from another round of physical therapy due to rolling of the left ankle.  Patient does not feel that is necessary at this time   REVIEW OF SYSTEMS: Out of a complete 14 system review of symptoms, the patient complains only of the following symptoms, and all other reviewed systems are negative.  ALLERGIES: Allergies  Allergen Reactions   Bidil [Isosorb Dinitrate-Hydralazine] Other (See Comments)    Lethargic,tired     HOME MEDICATIONS: Outpatient Medications Prior to Visit  Medication Sig Dispense Refill   acetaminophen (TYLENOL) 325 MG tablet Take 325 mg by mouth every 6 (six) hours as needed for moderate pain or headache.     AMBULATORY NON FORMULARY MEDICATION Medication Name: Diltiazem gel 2 % twice daily  use pea sized amount per rectum (Patient taking differently: Place 1 Application rectally 2 (two) times daily as needed (hemorrhoids). Medication Name: Diltiazem gel 2 % twice daily  use pea sized amount per rectum) 30 g 1   amLODipine (NORVASC) 10 MG tablet Take 1 tablet (10 mg total) by mouth daily. 30 tablet 1   ascorbic acid (VITAMIN C) 500 MG tablet Take 1,000 mg by mouth daily.     chlorthalidone (HYGROTON) 25 MG tablet Take 25 mg by mouth daily.     Cholecalciferol (VITAMIN D-3) 1000 units CAPS Take 1,000 Units by mouth daily.     Coenzyme Q10 100 MG capsule Take 100 mg by mouth daily.     cyanocobalamin (VITAMIN B12) 1000 MCG tablet Take 2,000 mcg by mouth daily.  diazepam (VALIUM) 5 MG tablet Take 1 tablet (5 mg total) by mouth every 6 (six) hours as needed for muscle spasms (urinary retention). 10 tablet 0   ELIQUIS 5 MG TABS tablet TAKE 1 TABLET BY MOUTH TWICE A DAY 180 tablet 1    levETIRAcetam (KEPPRA XR) 500 MG 24 hr tablet Take 2 tablets (1,000 mg total) by mouth daily. TAKE 2 TABLETS BY MOUTH AT BEDTIME. APPOINTMENT NEEDED FOR FURTHER REFILLS 60 tablet 0   lisinopril (PRINIVIL,ZESTRIL) 40 MG tablet Take 1 tablet (40 mg total) by mouth daily. (Patient taking differently: Take 40 mg by mouth every evening.) 30 tablet 1   metFORMIN (GLUCOPHAGE-XR) 500 MG 24 hr tablet Take 500 mg by mouth 3 (three) times daily with meals.     Methylcellulose, Laxative, (CITRUCEL) 500 MG TABS Take 500 mg by mouth at bedtime.     Multiple Vitamins-Minerals (MULTIVITAMIN WITH MINERALS) tablet Take 1 tablet by mouth daily.     Multiple Vitamins-Minerals (PRESERVISION AREDS 2+MULTI VIT PO) Take 1 capsule by mouth daily.     oxyCODONE (OXY IR/ROXICODONE) 5 MG immediate release tablet Take 1 tablet (5 mg total) by mouth every 4 (four) hours as needed for severe pain. 30 tablet 0   potassium chloride SA (K-DUR,KLOR-CON) 20 MEQ tablet Take 20 mEq by mouth 2 (two) times daily.     pravastatin (PRAVACHOL) 10 MG tablet Take 10 mg by mouth at bedtime.     Probiotic Product (ALIGN) 4 MG CAPS Take 4 mg by mouth at bedtime.     spironolactone (ALDACTONE) 50 MG tablet Take 50 mg by mouth daily.     tadalafil (CIALIS) 5 MG tablet Take 5 mg by mouth daily.     TOBRADEX ophthalmic ointment Place 1 Application into the left eye at bedtime.     vitamin E 180 MG (400 UNITS) capsule Take 800 Units by mouth daily.     zinc gluconate 50 MG tablet Take 100 mg by mouth daily.     No facility-administered medications prior to visit.    PAST MEDICAL HISTORY: Past Medical History:  Diagnosis Date   Diabetes mellitus without complication (HCC)    Dysrhythmia    Hyperlipemia    Hypertension    large right middle cerebral artery infarct, 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 s/p IV tPA, b. source unknown, c. loop recorder placed   Snoring 07/03/2015    PAST SURGICAL HISTORY: Past Surgical History:  Procedure Laterality Date    LOOP RECORDER IMPLANT  06/07/2013   MDT LinQ implanted by Dr Johney Frame for cryptogenic stroke   LOOP RECORDER IMPLANT N/A 06/07/2013   Procedure: LOOP RECORDER IMPLANT;  Surgeon: Gardiner Rhyme, MD;  Location: [facility] CATH LAB;  Service: Cardiovascular;  Laterality: N/A;   TEE WITHOUT CARDIOVERSION N/A 06/07/2013   Procedure: TRANSESOPHAGEAL ECHOCARDIOGRAM (TEE);  Surgeon: Lars Masson, MD;  Location: [facility] ENDOSCOPY;  Service: Cardiovascular;  Laterality: N/A;   TONSILLECTOMY     TRANSANAL HEMORRHOIDAL DEARTERIALIZATION N/A 02/18/2023   Procedure: TRANSANAL HEMORRHOIDAL DEARTERIALIZATION;  Surgeon: Romie Levee, MD;  Location: [facility] ORS;  Service: General;  Laterality: N/A;   UMBILICAL HERNIA REPAIR      FAMILY HISTORY: Family History  Problem Relation Age of Onset   Dementia Mother    Diabetes Mother    Heart attack Father     SOCIAL HISTORY: Social History   Socioeconomic History   Marital status: Married    Spouse name: Jamesetta So   Number of children: 2   Years  of education: College   Highest education level: Not on file  Occupational History   Occupation: Retired    Associate Professor: [employer]  Tobacco Use   Smoking status: Former   Smokeless tobacco: Never   Tobacco comments:    Quit 25 years ago.  Vaping Use   Vaping status: Never Used  Substance and Sexual Activity   Alcohol use: Yes    Alcohol/week: 1.0 standard drink of alcohol    Types: 1 Glasses of wine per week    Comment: Rare   Drug use: No   Sexual activity: Never  Other Topics Concern   Not on file  Social History Narrative   Patient lives at home Jamesetta So).   Right handed   Drinks no caffeine daily   Social Drivers of Corporate investment banker Strain: Not on file  Food Insecurity: Not on file  Transportation Needs: Not on file  Physical Activity: Not on file  Stress: Not on file  Social Connections: Not on file  Intimate Partner Violence: Not on file      PHYSICAL EXAM  Vitals:    06/30/23 1438  BP: (!) 141/74  Pulse: 72  Weight: 230 lb (104.3 kg)  Height: 6\' 3"  (1.905 m)   Body mass index is 28.75 kg/m.  Generalized: Well developed, in no acute distress   Neurological examination  Mentation: Alert oriented to time, place, history taking. Follows all commands speech and language fluent Cranial nerve II-XII: Pupils were equal round reactive to light. Extraocular movements were full, visual field were full on confrontational test. Facial sensation and strength were normal. Uvula tongue midline. Head turning and shoulder shrug  were normal and symmetric. Motor: The motor testing reveals 5 over 5 strength of all 4 extremities with exception of 4/5 strength in the left upper extremity.  Increased tone noted in the left upper extremity.  good symmetric motor tone is noted throughout.  Sensory: Sensory testing is intact to soft touch on all 4 extremities. No evidence of extinction is noted.  Coordination: Cerebellar testing reveals good finger-nose-finger and heel-to-shin bilaterally.  Gait and station: Uses a cane when ambulating.  Slight limp on the left.  Tandem gait not attempted. Reflexes: Deep tendon reflexes are symmetric and normal bilaterally.   DIAGNOSTIC DATA (LABS, IMAGING, TESTING) - I reviewed patient records, labs, notes, testing and imaging myself where available.  Lab Results  Component Value Date   WBC 9.3 02/09/2023   HGB 15.1 02/09/2023   HCT 43.6 02/09/2023   MCV 100.7 (H) 02/09/2023   PLT 209 02/09/2023      Component Value Date/Time   NA 138 02/09/2023 1144   K 3.8 02/09/2023 1144   CL 99 02/09/2023 1144   CO2 24 02/09/2023 1144   GLUCOSE 147 (H) 02/09/2023 1144   BUN 19 02/09/2023 1144   CREATININE 1.08 02/09/2023 1144   CALCIUM 9.8 02/09/2023 1144   PROT 7.0 09/19/2014 1856   ALBUMIN 3.9 09/19/2014 1856   AST 24 09/19/2014 1856   ALT 13 09/19/2014 1856   ALKPHOS 58 09/19/2014 1856   BILITOT 1.1 09/19/2014 1856   GFRNONAA >60  02/09/2023 1144   GFRAA 76 (L) 09/19/2014 1856   Lab Results  Component Value Date   CHOL 139 09/20/2014   HDL 28 (L) 09/20/2014   LDLCALC 68 09/20/2014   TRIG 214 (H) 09/20/2014   CHOLHDL 5.0 09/20/2014   Lab Results  Component Value Date   HGBA1C 6.2 (H) 02/09/2023   No  results found for: "VITAMINB12" No results found for: "TSH"    ASSESSMENT AND PLAN 78 y.o. year old male  has a past medical history of Diabetes mellitus without complication (HCC), Dysrhythmia, Hyperlipemia, Hypertension, large right middle cerebral artery infarct, embolic (06/03/2013), and Snoring (07/03/2015). here with:  1.  History of stroke 2.  Seizures  -Continue Keppra extended release 1000 mg daily -Provided information on his AVS about driving rehab should he want to pursue reinstating his driver license. -If he desires to do physical therapy in the future amenable to placing an order -Follow-up in 1 year or sooner if needed     Butch Penny, MSN, NP-C 06/30/2023, 2:26 PM [facility] [address], [address] [place], [place] [address] [phone]

## 2023-07-08 DIAGNOSIS — R159 Full incontinence of feces: Secondary | ICD-10-CM | POA: Diagnosis not present

## 2023-07-08 DIAGNOSIS — I10 Essential (primary) hypertension: Secondary | ICD-10-CM | POA: Diagnosis not present

## 2023-07-08 DIAGNOSIS — I6930 Unspecified sequelae of cerebral infarction: Secondary | ICD-10-CM | POA: Diagnosis not present

## 2023-07-08 DIAGNOSIS — E1169 Type 2 diabetes mellitus with other specified complication: Secondary | ICD-10-CM | POA: Diagnosis not present

## 2023-07-08 DIAGNOSIS — I4891 Unspecified atrial fibrillation: Secondary | ICD-10-CM | POA: Diagnosis not present

## 2023-07-08 DIAGNOSIS — G40909 Epilepsy, unspecified, not intractable, without status epilepticus: Secondary | ICD-10-CM | POA: Diagnosis not present

## 2023-08-17 DIAGNOSIS — R151 Fecal smearing: Secondary | ICD-10-CM | POA: Diagnosis not present

## 2023-08-21 DIAGNOSIS — L603 Nail dystrophy: Secondary | ICD-10-CM | POA: Diagnosis not present

## 2023-08-21 DIAGNOSIS — M216X9 Other acquired deformities of unspecified foot: Secondary | ICD-10-CM | POA: Diagnosis not present

## 2023-08-21 DIAGNOSIS — I739 Peripheral vascular disease, unspecified: Secondary | ICD-10-CM | POA: Diagnosis not present

## 2023-08-21 DIAGNOSIS — M19071 Primary osteoarthritis, right ankle and foot: Secondary | ICD-10-CM | POA: Diagnosis not present

## 2023-08-21 DIAGNOSIS — E1151 Type 2 diabetes mellitus with diabetic peripheral angiopathy without gangrene: Secondary | ICD-10-CM | POA: Diagnosis not present

## 2023-08-21 DIAGNOSIS — M19072 Primary osteoarthritis, left ankle and foot: Secondary | ICD-10-CM | POA: Diagnosis not present

## 2023-08-21 DIAGNOSIS — L84 Corns and callosities: Secondary | ICD-10-CM | POA: Diagnosis not present

## 2023-08-21 DIAGNOSIS — L851 Acquired keratosis [keratoderma] palmaris et plantaris: Secondary | ICD-10-CM | POA: Diagnosis not present

## 2023-08-21 DIAGNOSIS — B351 Tinea unguium: Secondary | ICD-10-CM | POA: Diagnosis not present

## 2023-09-15 DIAGNOSIS — I10 Essential (primary) hypertension: Secondary | ICD-10-CM | POA: Diagnosis not present

## 2023-09-15 DIAGNOSIS — I4891 Unspecified atrial fibrillation: Secondary | ICD-10-CM | POA: Diagnosis not present

## 2023-09-15 DIAGNOSIS — E1169 Type 2 diabetes mellitus with other specified complication: Secondary | ICD-10-CM | POA: Diagnosis not present

## 2023-09-30 DIAGNOSIS — E1169 Type 2 diabetes mellitus with other specified complication: Secondary | ICD-10-CM | POA: Diagnosis not present

## 2023-09-30 DIAGNOSIS — I10 Essential (primary) hypertension: Secondary | ICD-10-CM | POA: Diagnosis not present

## 2023-09-30 DIAGNOSIS — I4891 Unspecified atrial fibrillation: Secondary | ICD-10-CM | POA: Diagnosis not present

## 2023-10-08 DIAGNOSIS — I10 Essential (primary) hypertension: Secondary | ICD-10-CM | POA: Diagnosis not present

## 2023-10-08 DIAGNOSIS — G40909 Epilepsy, unspecified, not intractable, without status epilepticus: Secondary | ICD-10-CM | POA: Diagnosis not present

## 2023-10-08 DIAGNOSIS — I6930 Unspecified sequelae of cerebral infarction: Secondary | ICD-10-CM | POA: Diagnosis not present

## 2023-10-08 DIAGNOSIS — E1169 Type 2 diabetes mellitus with other specified complication: Secondary | ICD-10-CM | POA: Diagnosis not present

## 2023-10-08 DIAGNOSIS — I4891 Unspecified atrial fibrillation: Secondary | ICD-10-CM | POA: Diagnosis not present

## 2023-10-14 DIAGNOSIS — I4891 Unspecified atrial fibrillation: Secondary | ICD-10-CM | POA: Diagnosis not present

## 2023-10-14 DIAGNOSIS — E1169 Type 2 diabetes mellitus with other specified complication: Secondary | ICD-10-CM | POA: Diagnosis not present

## 2023-10-14 DIAGNOSIS — I10 Essential (primary) hypertension: Secondary | ICD-10-CM | POA: Diagnosis not present

## 2023-10-15 ENCOUNTER — Encounter: Payer: Self-pay | Admitting: Cardiovascular Disease

## 2023-10-15 ENCOUNTER — Ambulatory Visit: Attending: Cardiovascular Disease | Admitting: Cardiovascular Disease

## 2023-10-15 VITALS — BP 110/70 | HR 64 | Ht 75.0 in | Wt 240.0 lb

## 2023-10-15 DIAGNOSIS — I4510 Unspecified right bundle-branch block: Secondary | ICD-10-CM | POA: Diagnosis not present

## 2023-10-15 DIAGNOSIS — I4821 Permanent atrial fibrillation: Secondary | ICD-10-CM | POA: Diagnosis not present

## 2023-10-15 NOTE — Patient Instructions (Signed)

## 2023-10-15 NOTE — Progress Notes (Signed)
 Electrophysiology Office Note:    Date:  10/15/2023   ID:  Donald [name], DOB [birth year], MRN [id number]  PCP:  Ronna Coho, MD   [facility] Providers Cardiologist:  Jolly Needle, MD (Inactive) Electrophysiologist:  Efraim Grange, MD     Referring MD: Ronna Coho, MD   History of Present Illness:    Donald [name] is a 78 y.o. male with a medical history significant for stroke for which a loop recorder was placed, atrial fibrillation, now deemed permanent, who presents for EP follow up.     Discussed the use of AI scribe software for clinical note transcription with the patient, who gave verbal consent to proceed.  History of Present Illness Donald [name] "Donald [name]" is a 78 year old male with atrial fibrillation who presents for evaluation of his atrial fibrillation and related symptoms.  In January 2015, he experienced a stroke affecting his left side, leading to the placement of a loop recorder to monitor for potential cardiac causes. Initially, the loop recorder did not detect atrial fibrillation, but about a year later, he began experiencing symptoms such as lethargy and fatigue. Atrial fibrillation was identified by his primary care physician and has persisted since. He does not experience significant symptoms from the atrial fibrillation, such as syncope or dizziness, although he has had two episodes of vagal syncope unrelated to the atrial fibrillation.  He has been on Eliquis  since the atrial fibrillation was detected. He monitors his blood pressure regularly and notes a slower heart rate in recent years.  He has a history of hypertension, which he attributes to lifestyle factors such as business travel, poor diet, and lack of exercise. He has made significant lifestyle changes, including weight loss from 290 pounds to 226 pounds, primarily through dietary changes. He has maintained this weight loss for several years, although he recently gained 10  pounds, which he attributes to a change in diabetes medication.  He walks regularly, having walked 400 miles in 2023, but his activity decreased following his surgery in 2024. He uses a cane due to slower reaction time on his left side post-stroke.  He notes struggles with long standing ED and has considered surgical alternatives but has been worried about stroke vs bleeding risk and then need for holding anticoagulation for the surgery.         Today, He reports he is doing well and is at baseline.  EKGs/Labs/Other Studies Reviewed Today:     Echocardiogram:  TTE June 2024 EF 65 to 70%.  Moderate asymmetric left ventricular hypertrophy with the basal septal segment.  Left atrial size mildly dilated.  Atrial size mildly dilated.     EKG:   EKG Interpretation Date/Time:  Thursday Oct 15 2023 84:69:62 EDT Ventricular Rate:  64 PR Interval:    QRS Duration:  136 QT Interval:  404 QTC Calculation: 416 R Axis:   -88  Text Interpretation: Atrial fibrillation Left axis deviation Right bundle branch block Anteroseptal infarct , age undetermined When compared with ECG of 14-Feb-2021 00:29, RBBB is new Confirmed by Marlane Silver [phone]) on 10/15/2023 4:45:21 PM     Physical Exam:    VS:  BP 110/70 (BP Location: Left Arm, Patient Position: Sitting, Cuff Size: Large)   Pulse 64   Ht 6\' 3"  (1.905 m)   Wt 240 lb (108.9 kg)   SpO2 98%   BMI 30.00 kg/m     Wt Readings from Last 3 Encounters:  10/15/23 240 lb (108.9 kg)  06/30/23 230  lb (104.3 kg)  02/18/23 227 lb (103 kg)     GEN: Well nourished, well developed in no acute distress CARDIAC: RRR, no murmurs, rubs, gallops RESPIRATORY:  Normal work of breathing MUSCULOSKELETAL: no edema    ASSESSMENT & PLAN:     Permanent atrial fibrillation Loop recorder in place --no longer functioning Rate is controlled  Secondary hypercogulable state CHA2DS2-VASc score is 6 Continue apixaban  5 mg p.o. twice daily  Right  bundle branch block New since last EKG (2022) Will continue to monitor I informed patient to contact us  if he knows abrupt decrease in his heart rate associated with increased fatigue.   Signed, Efraim Grange, MD  10/15/2023 5:32 PM    [facility]

## 2023-10-27 ENCOUNTER — Telehealth: Payer: Self-pay | Admitting: Adult Health

## 2023-10-27 ENCOUNTER — Encounter: Payer: Self-pay | Admitting: Adult Health

## 2023-10-27 NOTE — Telephone Encounter (Signed)
 LVM and sent letter in mail informing pt of need to reschedule 06/29/24 appt - NP schedule change

## 2023-10-31 DIAGNOSIS — E1169 Type 2 diabetes mellitus with other specified complication: Secondary | ICD-10-CM | POA: Diagnosis not present

## 2023-10-31 DIAGNOSIS — I4891 Unspecified atrial fibrillation: Secondary | ICD-10-CM | POA: Diagnosis not present

## 2023-10-31 DIAGNOSIS — I10 Essential (primary) hypertension: Secondary | ICD-10-CM | POA: Diagnosis not present

## 2023-11-13 DIAGNOSIS — I10 Essential (primary) hypertension: Secondary | ICD-10-CM | POA: Diagnosis not present

## 2023-11-13 DIAGNOSIS — E1169 Type 2 diabetes mellitus with other specified complication: Secondary | ICD-10-CM | POA: Diagnosis not present

## 2023-11-13 DIAGNOSIS — I4891 Unspecified atrial fibrillation: Secondary | ICD-10-CM | POA: Diagnosis not present

## 2023-11-30 DIAGNOSIS — I10 Essential (primary) hypertension: Secondary | ICD-10-CM | POA: Diagnosis not present

## 2023-11-30 DIAGNOSIS — E1169 Type 2 diabetes mellitus with other specified complication: Secondary | ICD-10-CM | POA: Diagnosis not present

## 2023-11-30 DIAGNOSIS — I4891 Unspecified atrial fibrillation: Secondary | ICD-10-CM | POA: Diagnosis not present

## 2023-12-13 DIAGNOSIS — I4891 Unspecified atrial fibrillation: Secondary | ICD-10-CM | POA: Diagnosis not present

## 2023-12-13 DIAGNOSIS — I10 Essential (primary) hypertension: Secondary | ICD-10-CM | POA: Diagnosis not present

## 2023-12-13 DIAGNOSIS — E1169 Type 2 diabetes mellitus with other specified complication: Secondary | ICD-10-CM | POA: Diagnosis not present

## 2023-12-15 DIAGNOSIS — B351 Tinea unguium: Secondary | ICD-10-CM | POA: Diagnosis not present

## 2023-12-15 DIAGNOSIS — E1151 Type 2 diabetes mellitus with diabetic peripheral angiopathy without gangrene: Secondary | ICD-10-CM | POA: Diagnosis not present

## 2023-12-15 DIAGNOSIS — I739 Peripheral vascular disease, unspecified: Secondary | ICD-10-CM | POA: Diagnosis not present

## 2023-12-15 DIAGNOSIS — L851 Acquired keratosis [keratoderma] palmaris et plantaris: Secondary | ICD-10-CM | POA: Diagnosis not present

## 2023-12-15 DIAGNOSIS — M19071 Primary osteoarthritis, right ankle and foot: Secondary | ICD-10-CM | POA: Diagnosis not present

## 2023-12-15 DIAGNOSIS — M216X9 Other acquired deformities of unspecified foot: Secondary | ICD-10-CM | POA: Diagnosis not present

## 2023-12-15 DIAGNOSIS — L84 Corns and callosities: Secondary | ICD-10-CM | POA: Diagnosis not present

## 2023-12-15 DIAGNOSIS — L603 Nail dystrophy: Secondary | ICD-10-CM | POA: Diagnosis not present

## 2023-12-15 DIAGNOSIS — M19072 Primary osteoarthritis, left ankle and foot: Secondary | ICD-10-CM | POA: Diagnosis not present

## 2023-12-21 DIAGNOSIS — H538 Other visual disturbances: Secondary | ICD-10-CM | POA: Diagnosis not present

## 2023-12-21 DIAGNOSIS — E119 Type 2 diabetes mellitus without complications: Secondary | ICD-10-CM | POA: Diagnosis not present

## 2023-12-21 DIAGNOSIS — H04129 Dry eye syndrome of unspecified lacrimal gland: Secondary | ICD-10-CM | POA: Diagnosis not present

## 2023-12-31 DIAGNOSIS — I4891 Unspecified atrial fibrillation: Secondary | ICD-10-CM | POA: Diagnosis not present

## 2023-12-31 DIAGNOSIS — E1169 Type 2 diabetes mellitus with other specified complication: Secondary | ICD-10-CM | POA: Diagnosis not present

## 2023-12-31 DIAGNOSIS — I10 Essential (primary) hypertension: Secondary | ICD-10-CM | POA: Diagnosis not present

## 2024-01-08 DIAGNOSIS — G40909 Epilepsy, unspecified, not intractable, without status epilepticus: Secondary | ICD-10-CM | POA: Diagnosis not present

## 2024-01-08 DIAGNOSIS — Z6830 Body mass index (BMI) 30.0-30.9, adult: Secondary | ICD-10-CM | POA: Diagnosis not present

## 2024-01-08 DIAGNOSIS — E1169 Type 2 diabetes mellitus with other specified complication: Secondary | ICD-10-CM | POA: Diagnosis not present

## 2024-01-08 DIAGNOSIS — N529 Male erectile dysfunction, unspecified: Secondary | ICD-10-CM | POA: Diagnosis not present

## 2024-01-08 DIAGNOSIS — I4891 Unspecified atrial fibrillation: Secondary | ICD-10-CM | POA: Diagnosis not present

## 2024-01-08 DIAGNOSIS — I10 Essential (primary) hypertension: Secondary | ICD-10-CM | POA: Diagnosis not present

## 2024-01-08 DIAGNOSIS — I6930 Unspecified sequelae of cerebral infarction: Secondary | ICD-10-CM | POA: Diagnosis not present

## 2024-01-12 DIAGNOSIS — I4891 Unspecified atrial fibrillation: Secondary | ICD-10-CM | POA: Diagnosis not present

## 2024-01-12 DIAGNOSIS — I10 Essential (primary) hypertension: Secondary | ICD-10-CM | POA: Diagnosis not present

## 2024-01-12 DIAGNOSIS — E1169 Type 2 diabetes mellitus with other specified complication: Secondary | ICD-10-CM | POA: Diagnosis not present

## 2024-01-13 ENCOUNTER — Other Ambulatory Visit: Payer: Self-pay

## 2024-01-13 DIAGNOSIS — I4819 Other persistent atrial fibrillation: Secondary | ICD-10-CM

## 2024-01-13 MED ORDER — APIXABAN 5 MG PO TABS: 5.0000 mg | ORAL_TABLET | Freq: Two times a day (BID) | ORAL | 1 refills | Status: AC

## 2024-01-13 NOTE — Telephone Encounter (Signed)
 Prescription refill request for Eliquis  received. Indication: Afib  Last office visit: 10/15/23 ([facility])  Scr: 1.08 (02/09/23)  Age: 78 Weight: 108.9kg  Appropriate dose. Refill sent.

## 2024-01-28 ENCOUNTER — Telehealth: Payer: Self-pay | Admitting: Adult Health

## 2024-01-28 DIAGNOSIS — I69354 Hemiplegia and hemiparesis following cerebral infarction affecting left non-dominant side: Secondary | ICD-10-CM

## 2024-01-28 NOTE — Telephone Encounter (Signed)
 Pt reports that within this past year he has had about 2-4 falls, he would like to know if an order could be placed for additional PT, please call.

## 2024-01-31 DIAGNOSIS — I4891 Unspecified atrial fibrillation: Secondary | ICD-10-CM | POA: Diagnosis not present

## 2024-01-31 DIAGNOSIS — E1169 Type 2 diabetes mellitus with other specified complication: Secondary | ICD-10-CM | POA: Diagnosis not present

## 2024-01-31 DIAGNOSIS — I10 Essential (primary) hypertension: Secondary | ICD-10-CM | POA: Diagnosis not present

## 2024-02-02 DIAGNOSIS — Y92009 Unspecified place in unspecified non-institutional (private) residence as the place of occurrence of the external cause: Secondary | ICD-10-CM | POA: Diagnosis not present

## 2024-02-02 DIAGNOSIS — Z043 Encounter for examination and observation following other accident: Secondary | ICD-10-CM | POA: Diagnosis not present

## 2024-02-02 DIAGNOSIS — M25562 Pain in left knee: Secondary | ICD-10-CM | POA: Diagnosis not present

## 2024-02-02 DIAGNOSIS — W19XXXA Unspecified fall, initial encounter: Secondary | ICD-10-CM | POA: Diagnosis not present

## 2024-02-02 DIAGNOSIS — R262 Difficulty in walking, not elsewhere classified: Secondary | ICD-10-CM | POA: Diagnosis not present

## 2024-02-02 NOTE — Telephone Encounter (Signed)
 Per last note, patient was to call if he decided he wanted PT. I called the patient. He states his condition has not changed and he has the same concerns as last visit. Still feels reaction time decreased on L side, but not weak. Has had 3-4 falls this year. Last fall 1 week ago, could not get himself up, twisted knee (now no pain or swelling), did not hit head. Had to call 911 to help him get up and was on floor awhile. He will be seeing PCP 1 hr from now. He feels PT will help him develop core and build strength to hopefully get self off floor if he falls again. Ok with sending to neuro PT next door again.

## 2024-02-02 NOTE — Telephone Encounter (Signed)
 I do not mind referring however if he is seeing his PCP they may place the referral?

## 2024-02-03 NOTE — Telephone Encounter (Signed)
 Called patient and LVM (ok per DPR) informing him that [facility] NP is ok with referring to PT but we do want to check with patient first and see if his PCP referred him yesterday when he saw him for the fall. Left office number for call back.   When he calls back, find out if his PCP referred him to physical therapy yesterday. If he did not, we will send referral to the rehab center next door.

## 2024-02-03 NOTE — Telephone Encounter (Signed)
 Pt called message was read off ot pt.  He said the PCP did not, pcp only sent off for x ray of knee.  RN's response was relayed to pt.  Pt is ok with referral being sent to the rehab center next door.

## 2024-02-03 NOTE — Addendum Note (Signed)
 Addended by: NEYSA NENA RAMAN on: 02/03/2024 02:31 PM   Modules accepted: Orders

## 2024-02-03 NOTE — Addendum Note (Signed)
 Addended by: SHERRYL DUWAINE SQUIBB on: 02/03/2024 03:43 PM   Modules accepted: Orders

## 2024-02-11 DIAGNOSIS — E1169 Type 2 diabetes mellitus with other specified complication: Secondary | ICD-10-CM | POA: Diagnosis not present

## 2024-02-11 DIAGNOSIS — I10 Essential (primary) hypertension: Secondary | ICD-10-CM | POA: Diagnosis not present

## 2024-02-11 DIAGNOSIS — I4891 Unspecified atrial fibrillation: Secondary | ICD-10-CM | POA: Diagnosis not present

## 2024-03-01 DIAGNOSIS — I4891 Unspecified atrial fibrillation: Secondary | ICD-10-CM | POA: Diagnosis not present

## 2024-03-01 DIAGNOSIS — I10 Essential (primary) hypertension: Secondary | ICD-10-CM | POA: Diagnosis not present

## 2024-03-01 DIAGNOSIS — E1169 Type 2 diabetes mellitus with other specified complication: Secondary | ICD-10-CM | POA: Diagnosis not present

## 2024-03-07 DIAGNOSIS — E119 Type 2 diabetes mellitus without complications: Secondary | ICD-10-CM | POA: Diagnosis not present

## 2024-03-07 DIAGNOSIS — E6689 Other obesity not elsewhere classified: Secondary | ICD-10-CM | POA: Diagnosis not present

## 2024-03-07 DIAGNOSIS — Z6829 Body mass index (BMI) 29.0-29.9, adult: Secondary | ICD-10-CM | POA: Diagnosis not present

## 2024-03-07 DIAGNOSIS — I10 Essential (primary) hypertension: Secondary | ICD-10-CM | POA: Diagnosis not present

## 2024-03-09 ENCOUNTER — Ambulatory Visit: Attending: Adult Health | Admitting: Physical Therapy

## 2024-03-09 ENCOUNTER — Encounter: Payer: Self-pay | Admitting: Physical Therapy

## 2024-03-09 ENCOUNTER — Other Ambulatory Visit: Payer: Self-pay

## 2024-03-09 VITALS — BP 118/72 | HR 86

## 2024-03-09 DIAGNOSIS — R278 Other lack of coordination: Secondary | ICD-10-CM | POA: Insufficient documentation

## 2024-03-09 DIAGNOSIS — R29818 Other symptoms and signs involving the nervous system: Secondary | ICD-10-CM | POA: Diagnosis not present

## 2024-03-09 DIAGNOSIS — R2681 Unsteadiness on feet: Secondary | ICD-10-CM | POA: Insufficient documentation

## 2024-03-09 DIAGNOSIS — R29898 Other symptoms and signs involving the musculoskeletal system: Secondary | ICD-10-CM | POA: Diagnosis not present

## 2024-03-09 DIAGNOSIS — I69354 Hemiplegia and hemiparesis following cerebral infarction affecting left non-dominant side: Secondary | ICD-10-CM | POA: Diagnosis not present

## 2024-03-09 DIAGNOSIS — R209 Unspecified disturbances of skin sensation: Secondary | ICD-10-CM | POA: Insufficient documentation

## 2024-03-09 NOTE — Therapy (Signed)
 OUTPATIENT PHYSICAL THERAPY NEURO EVALUATION   Patient Name: Donald [name] MRN: [id number] DOB:1946-04-10, 78 y.o., male Today's Date: 03/09/2024   PCP: Kip Righter, MD REFERRING PROVIDER: Sherryl Bouchard, NP  END OF SESSION:  03/09/24 1415  PT Visits / Re-Eval  Visit Number 1  Number of Visits 17 (16 + eval)  Date for Recertification  05/13/24 (pushed out due to scheduling delay)  Authorization  Authorization Type Medicare A&B w/ [employer] supplement  Progress Note Due on Visit 10  PT Time Calculation  PT Start Time 1403  PT Stop Time 1506  PT Time Calculation (min) 63 min  PT - End of Session  Equipment Utilized During Treatment Gait belt  Activity Tolerance Patient tolerated treatment well  Behavior During Therapy WFL for tasks assessed/performed    Past Medical History:  Diagnosis Date   Diabetes mellitus without complication (HCC)    Dysrhythmia    Hyperlipemia    Hypertension    large right middle cerebral artery infarct, 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 s/p IV tPA, b. source unknown, c. loop recorder placed   Snoring 07/03/2015   Past Surgical History:  Procedure Laterality Date   LOOP RECORDER IMPLANT  06/07/2013   MDT LinQ implanted by Dr Kelsie for cryptogenic stroke   LOOP RECORDER IMPLANT N/A 06/07/2013   Procedure: LOOP RECORDER IMPLANT;  Surgeon: Lynwood JONETTA Kelsie, MD;  Location: [facility] CATH LAB;  Service: Cardiovascular;  Laterality: N/A;   TEE WITHOUT CARDIOVERSION N/A 06/07/2013   Procedure: TRANSESOPHAGEAL ECHOCARDIOGRAM (TEE);  Surgeon: Leim VEAR Moose, MD;  Location: [facility] ENDOSCOPY;  Service: Cardiovascular;  Laterality: N/A;   TONSILLECTOMY     TRANSANAL HEMORRHOIDAL DEARTERIALIZATION N/A 02/18/2023   Procedure: TRANSANAL HEMORRHOIDAL DEARTERIALIZATION;  Surgeon: Debby Hila, MD;  Location: [facility] ORS;  Service: General;  Laterality: N/A;   UMBILICAL HERNIA REPAIR     Patient Active Problem List   Diagnosis Date Noted   Permanent atrial  fibrillation (HCC) 05/17/2019   Acquired thrombophilia 05/17/2019   Snoring 07/03/2015   Persistent atrial fibrillation (HCC) 04/16/2015   Hypokalemia 09/20/2014   HTN (hypertension) 09/19/2014   HLD (hyperlipidemia) 09/19/2014   Diabetes mellitus without complication (HCC) 09/19/2014   Jerking 09/19/2014   Alterations of sensations, late effect of cerebrovascular disease 11/07/2013   Disturbances of vision, late effect of cerebrovascular disease 11/07/2013   Sinus bradycardia 08/08/2013   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HCC) 07/26/2013   Left-sided neglect 07/26/2013   Small vessel disease 06/07/2013   Obesity, unspecified 06/07/2013   Cerebral infarction (HCC) 06/07/2013   large right middle cerebral artery infarct, 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   Hypertension 06/03/2013   Diabetes (HCC) 06/03/2013    ONSET DATE: 06/03/2013 (CVA)  REFERRING DIAG: P30.645 (ICD-10-CM) - Hemiparesis affecting left side as late effect of cerebrovascular accident (HCC)  THERAPY DIAG:  Hemiplegia and hemiparesis following cerebral infarction affecting left non-dominant side (HCC)  Other symptoms and signs involving the nervous system  Other symptoms and signs involving the musculoskeletal system  Other lack of coordination  Unsteadiness on feet  Unspecified disturbances of skin sensation  Rationale for Evaluation and Treatment: Rehabilitation  SUBJECTIVE:  SUBJECTIVE STATEMENT: Donald [name]  Pt states what spurred his return to PT is recent inability to get positioned to get himself off the floor in a tight bedroom as he has before.  He has a bit more hesitance staying home alone because of this.  He feels comfortable with his SPC and walking w/o AFO.  3 falls in the last 6 months and 5 in the recent year.  He  mostly falls onto his bottom and wife is concerned about him breaking a leg when trying to turn over onto knees.  He walks w/ SPC in front of his home at least a mile daily.  He reports a hemorrhoid surgery in Sept 2024 that disrupted his walking schedule, severe weight fluctuation, and recent home stressors with close family death.  He feels distraction and emotions impact his function as well and he feels everything takes him more focus now than a year ago. Pt accompanied by: family member (Wife - [name])  PERTINENT HISTORY: R MCA infarct 06/03/2013 S/p tPA, HTN, DM2, Afib, had 1 seizure in 2017  PAIN:  Are you having pain? No  PRECAUTIONS: Fall and Other: loop recorder  RED FLAGS: None   WEIGHT BEARING RESTRICTIONS: No  FALLS: Has patient fallen in last 6 months? Yes. Number of falls 3  LIVING ENVIRONMENT: Lives with: lives with their spouse Lives in: House/apartment Stairs: No Has following equipment at home: Single point cane  PLOF: Requires assistive device for independence  PATIENT GOALS: To work on reaction time and getting off the floor  OBJECTIVE:  Note: Objective measures were completed at Evaluation unless otherwise noted.  DIAGNOSTIC FINDINGS:  Brain MRI 09/20/2014 IMPRESSION: 1. No acute infarct. 2. Chronic right MCA territory infarct. 3. Subtle T2 hyperintensity in the left hippocampus and left temporal lobe cortex versus artifact. Recent seizure activity is a consideration.  COGNITION: Overall cognitive status: History of cognitive impairments - at baseline - reports quicker to anger/verbalize emotions/sometimes overstimulated with sounds and distractions   SENSATION: Light touch: WFL - LLE hypersensitive/question of allodynia  COORDINATION: Mildly dysmetric LLE  EDEMA:  None significant in BLE  MUSCLE TONE: LLE: Modifed Ashworth Scale 1+ = Slight increase in muscle tone, manifested by a catch, followed by minimal resistance throughout the remainder  (less than half) of the ROM  POSTURE: rounded shoulders, flexed trunk , and weight shift right - mildly flexed trunk and right weight shift in standing and with ambulation, L shoulder more rounded than R w/ mild shoulder hike  LOWER EXTREMITY ROM:     Active  Right Eval Left Eval  Hip flexion WNL WFL  Hip extension    Hip abduction    Hip adduction    Hip internal rotation    Hip external rotation    Knee flexion    Knee extension    Ankle dorsiflexion  2+  Ankle plantarflexion    Ankle inversion    Ankle eversion     (Blank rows = not tested)  LOWER EXTREMITY MMT:    MMT Right Eval Left Eval  Hip flexion    Hip extension    Hip abduction    Hip adduction    Hip internal rotation    Hip external rotation    Knee flexion    Knee extension    Ankle dorsiflexion    Ankle plantarflexion    Ankle inversion    Ankle eversion    (Blank rows = not tested)  BED MOBILITY:  Findings: Sit to supine Complete  Independence Supine to sit Complete Independence Rolling to Right Complete Independence Rolling to Left Complete Independence - needs increased time and momentum for L and R Ind w/ features of tempurpedic style bed  TRANSFERS: Sit to stand: SBA  Assistive device utilized: Single point cane     Stand to sit: SBA  Assistive device utilized: Single point cane     Chair to chair: SBA  Assistive device utilized: Single point cane       RAMP:  Not tested  CURB:  Not tested  STAIRS: Not tested GAIT: Findings: Distance walked: various clinic distances and Comments: Pt has mild right reliance w/ SPC, no foot scuff/toe catch even without AFO, shortened stride and slightly diminished flexion pattern of LLE  FUNCTIONAL TESTS:  5 times sit to stand: 18.89 sec w/ heavy right trunk shortening and RUE reliance w/ L toe down vs full weight bearing  PATIENT SURVEYS:  ABC scale: The Activities-Specific Balance Confidence (ABC) Scale TBA                                                                                                                               TREATMENT DATE: 03/09/2024    PATIENT EDUCATION: Education details: PT POC, assessments used and goals to be set.  Various stroke education and redirection to assessment.  Night lights for midnight bathroom trips.  Continue using walker in middle of the night for bathroom trips to limit falls.  Discussed purpose of AFO (pt still has, encouraged to bring next session) and possible ongoing need for this in certain settings vs updated bracing/orthotic option due to inverted left foot posture (flexible).  Balance strategies and connection to his fall situations.  Pt is interested in OT referral but declines ST needs at this time.  Discussed benefits of aquatic therapy on tone management and balance - pt wanting to focus on land interventions w/ hope for most progress at this time. Person educated: Patient and Spouse Education method: Medical illustrator Education comprehension: verbalized understanding and needs further education  HOME EXERCISE PROGRAM: To be established.  GOALS: Goals reviewed with patient? Yes  SHORT TERM GOALS: Target date: 04/08/2024  Pt will be independent and compliant with introductory strength and balance HEP in order to maintain functional progress and improve mobility. Baseline:  To be established. Goal status: INITIAL  2.  Pt will demonstrate adequate balance strategies to maintain upright with forward/overhead/and floor reaching. Baseline:  Very narrow limits of stability - several near falls related to this. Goal status: INITIAL  3.  Pt will be independent with desensitization strategies for LLE hypersensitivity management. Baseline: To be provided. Goal status: INITIAL  LONG TERM GOALS: Target date: 05/06/2024  Pt will be independent and compliant with advanced and finalized strength and balance HEP in order to maintain functional progress and improve  mobility. Baseline: To be established. Goal status: INITIAL  2.  ABC Scale to be assessed w/ LTG set as appropriate.  Baseline: To be assessed. Goal status: INITIAL  3.  Patient will demonstrate floor recovery w/ no more than SBA in order to improve safety and home management. Baseline: Reports recent minA even w/ support surface Goal status: INITIAL  4.  Pt will decrease 5xSTS to </=12 seconds w/ improved midline mechanics in order to demonstrate decreased risk for falls and improved functional bilateral LE strength and power. Baseline: 18.89 sec w/ heavy right trunk shortening and RUE reliance w/ L toe down vs full weight bearing Goal status: INITIAL  ASSESSMENT:  CLINICAL IMPRESSION: Patient is a 78 y.o. male who was seen today for physical therapy evaluation and treatment for deficits related to chronic CVA.  Pt has a significant PMH of R MCA infarct 06/03/2013 S/p tPA, HTN, DM2, Afib, and has had 1 seizure in 2017.  Identified impairments include left hemibody hypersensitivity, impaired motor control and planning, narrowed limits of stability, decreased L strength and increased LLE flexor tone.  Evaluation via the following assessment tools: 5xSTS indicate fall risk.  ABC Scale to be assessed next visit to further determine sense of imbalance as a predictor of falls.  He would benefit from skilled PT to address impairments as noted and progress towards long term goals.  OBJECTIVE IMPAIRMENTS: Abnormal gait, decreased activity tolerance, decreased balance, decreased cognition, decreased coordination, decreased endurance, decreased knowledge of use of DME, decreased strength, impaired sensation, impaired tone, improper body mechanics, and postural dysfunction.   ACTIVITY LIMITATIONS: carrying, lifting, bending, squatting, stairs, transfers, reach over head, and locomotion level  PARTICIPATION LIMITATIONS: laundry, shopping, and community activity  PERSONAL FACTORS: Age, Fitness,  Past/current experiences, Time since onset of injury/illness/exacerbation, and 1-2 comorbidities: HTN, Afib are also affecting patient's functional outcome.   REHAB POTENTIAL: Good  CLINICAL DECISION MAKING: Stable/uncomplicated  EVALUATION COMPLEXITY: Moderate  PLAN:  PT FREQUENCY: 2x/week  PT DURATION: 8 weeks  PLANNED INTERVENTIONS: 97164- PT Re-evaluation, 97750- Physical Performance Testing, 97110-Therapeutic exercises, 97530- Therapeutic activity, [id number]- Neuromuscular re-education, 97535- Self Care, [id number]- Manual therapy, [id number]- Gait training, [phone]- Orthotic Initial, [phone]- Orthotic/Prosthetic subsequent, [phone]- Aquatic Therapy, [phone]- Electrical stimulation (manual), Patient/Family education, Balance training, Stair training, Taping, Joint mobilization, Vestibular training, and DME instructions  PLAN FOR NEXT SESSION: Did he bring AFO? Does he need AFO updates? Gait training w/ vs w/o AFO and w/ SPC.  Floor recovery - incorporate tight space setup.  LLE NMR/strength/reaction time.  ASSESS ABC Scale and set goal.  Initiate strength and balance HEP - eversion strength and mobility.  Core strength.  Reaching floor/forward/overhead.  Desensitization strategies for LLE.  Pt declines offer of aquatic therapy at evaluation.  He uses a custom orthotic insert - add partial wedge to bias eversion?  Daved KATHEE Bull, PT, DPT 03/09/2024, 3:06 PM

## 2024-03-12 DIAGNOSIS — E1169 Type 2 diabetes mellitus with other specified complication: Secondary | ICD-10-CM | POA: Diagnosis not present

## 2024-03-12 DIAGNOSIS — I4891 Unspecified atrial fibrillation: Secondary | ICD-10-CM | POA: Diagnosis not present

## 2024-03-12 DIAGNOSIS — I10 Essential (primary) hypertension: Secondary | ICD-10-CM | POA: Diagnosis not present

## 2024-03-14 DIAGNOSIS — R351 Nocturia: Secondary | ICD-10-CM | POA: Diagnosis not present

## 2024-03-14 DIAGNOSIS — N5201 Erectile dysfunction due to arterial insufficiency: Secondary | ICD-10-CM | POA: Diagnosis not present

## 2024-03-15 ENCOUNTER — Ambulatory Visit: Admitting: Physical Therapy

## 2024-03-15 ENCOUNTER — Telehealth: Payer: Self-pay | Admitting: Physical Therapy

## 2024-03-15 ENCOUNTER — Encounter: Payer: Self-pay | Admitting: Physical Therapy

## 2024-03-15 VITALS — BP 111/80 | HR 71

## 2024-03-15 DIAGNOSIS — R2681 Unsteadiness on feet: Secondary | ICD-10-CM

## 2024-03-15 DIAGNOSIS — R278 Other lack of coordination: Secondary | ICD-10-CM

## 2024-03-15 DIAGNOSIS — R29898 Other symptoms and signs involving the musculoskeletal system: Secondary | ICD-10-CM | POA: Diagnosis not present

## 2024-03-15 DIAGNOSIS — I69354 Hemiplegia and hemiparesis following cerebral infarction affecting left non-dominant side: Secondary | ICD-10-CM | POA: Diagnosis not present

## 2024-03-15 DIAGNOSIS — R209 Unspecified disturbances of skin sensation: Secondary | ICD-10-CM | POA: Diagnosis not present

## 2024-03-15 DIAGNOSIS — R29818 Other symptoms and signs involving the nervous system: Secondary | ICD-10-CM | POA: Diagnosis not present

## 2024-03-15 NOTE — Patient Instructions (Addendum)
 To whom it may concern,  Could you evaluate left inverted ankle posture to determine benefit of lateral orthotic build up/wedge (possible addition to current orthotic) to improve foot position and protect lateral ankle ligaments?  Thank you, Daved Bull, PT, DTaP  Access Code: [facility] URL: [email or web address] Date: 03/15/2024 Prepared by: Daved Bull  Exercises - Seated Toe Raise  - 1-2 x daily - 4 x weekly - 2 sets - 10 reps - 2-3 seconds hold - Ankle Inversion Eversion Towel Slide  - 1-2 x daily - 4 x weekly - 2 sets - 10 reps - Seated Ankle Circles  - 1-2 x daily - 4 x weekly - 2 sets - 10 reps - Seated Heel Raise  - 1-2 x daily - 4 x weekly - 2 sets - 10 reps - 2-3 seconds hold - Seated Ankle Inversion Eversion PROM  - 1 x daily - 7 x weekly - 2 sets - 10 reps - Staggered Sit-to-Stand  - 1 x daily - 4-5 x weekly - 2-3 sets - 10 reps

## 2024-03-15 NOTE — Therapy (Unsigned)
 OUTPATIENT PHYSICAL THERAPY NEURO TREATMENT   Patient Name: Donald [name] MRN: [id number] DOB:[birth year], 78 y.o., male Today's Date: 03/15/2024   PCP: Kip Righter, MD REFERRING PROVIDER: Sherryl Bouchard, NP  END OF SESSION:  PT End of Session - 03/15/24 1500     Visit Number 2    Number of Visits 17   16 + eval   Date for Recertification  05/13/24   pushed out due to scheduling delay   Authorization Type Medicare A&B w/ [employer] supplement    Progress Note Due on Visit 10    PT Start Time 1453    PT Stop Time 1552    PT Time Calculation (min) 59 min    Equipment Utilized During Treatment Gait belt    Activity Tolerance Patient tolerated treatment well    Behavior During Therapy WFL for tasks assessed/performed         Past Medical History:  Diagnosis Date   Diabetes mellitus without complication (HCC)    Dysrhythmia    Hyperlipemia    Hypertension    large right middle cerebral artery infarct, 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 s/p IV tPA, b. source unknown, c. loop recorder placed   Snoring 07/03/2015   Past Surgical History:  Procedure Laterality Date   LOOP RECORDER IMPLANT  06/07/2013   MDT LinQ implanted by Dr Kelsie for cryptogenic stroke   LOOP RECORDER IMPLANT N/A 06/07/2013   Procedure: LOOP RECORDER IMPLANT;  Surgeon: Lynwood JONETTA Kelsie, MD;  Location: [facility] CATH LAB;  Service: Cardiovascular;  Laterality: N/A;   TEE WITHOUT CARDIOVERSION N/A 06/07/2013   Procedure: TRANSESOPHAGEAL ECHOCARDIOGRAM (TEE);  Surgeon: Leim VEAR Moose, MD;  Location: [facility] ENDOSCOPY;  Service: Cardiovascular;  Laterality: N/A;   TONSILLECTOMY     TRANSANAL HEMORRHOIDAL DEARTERIALIZATION N/A 02/18/2023   Procedure: TRANSANAL HEMORRHOIDAL DEARTERIALIZATION;  Surgeon: Debby Hila, MD;  Location: [facility] ORS;  Service: General;  Laterality: N/A;   UMBILICAL HERNIA REPAIR     Patient Active Problem List   Diagnosis Date Noted   Permanent atrial fibrillation (HCC) 05/17/2019   Acquired  thrombophilia 05/17/2019   Snoring 07/03/2015   Persistent atrial fibrillation (HCC) 04/16/2015   Hypokalemia 09/20/2014   HTN (hypertension) 09/19/2014   HLD (hyperlipidemia) 09/19/2014   Diabetes mellitus without complication (HCC) 09/19/2014   Jerking 09/19/2014   Alterations of sensations, late effect of cerebrovascular disease 11/07/2013   Disturbances of vision, late effect of cerebrovascular disease 11/07/2013   Sinus bradycardia 08/08/2013   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HCC) 07/26/2013   Left-sided neglect 07/26/2013   Small vessel disease 06/07/2013   Obesity, unspecified 06/07/2013   Cerebral infarction (HCC) 06/07/2013   large right middle cerebral artery infarct, 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   Hypertension 06/03/2013   Diabetes (HCC) 06/03/2013    ONSET DATE: 06/03/2013 (CVA)  REFERRING DIAG: P30.645 (ICD-10-CM) - Hemiparesis affecting left side as late effect of cerebrovascular accident (HCC)  THERAPY DIAG:  Other symptoms and signs involving the nervous system  Other symptoms and signs involving the musculoskeletal system  Other lack of coordination  Unsteadiness on feet  Unspecified disturbances of skin sensation  Hemiplegia and hemiparesis following cerebral infarction affecting left non-dominant side (HCC)  Rationale for Evaluation and Treatment: Rehabilitation  SUBJECTIVE:  SUBJECTIVE STATEMENT: Donald [name]  Pt requests to work on core. He brings prior AFO and shoe.  He continues to ambulate w/ SPC.  No falls or pain today, but he feels he may have taken his midday pills too close together as he feels a touch more tired than normal today.  No lightheadedness/dizziness. Pt accompanied by: self (Wife - waiting in car)  PERTINENT HISTORY: R MCA infarct 06/03/2013 S/p tPA,  HTN, DM2, Afib, had 1 seizure in 2017  PAIN:  Are you having pain? No  PRECAUTIONS: Fall and Other: loop recorder  RED FLAGS: None   WEIGHT BEARING RESTRICTIONS: No  FALLS: Has patient fallen in last 6 months? Yes. Number of falls 3  LIVING ENVIRONMENT: Lives with: lives with their spouse Lives in: House/apartment Stairs: No Has following equipment at home: Single point cane  PLOF: Requires assistive device for independence  PATIENT GOALS: To work on reaction time and getting off the floor  OBJECTIVE:  Note: Objective measures were completed at Evaluation unless otherwise noted.  DIAGNOSTIC FINDINGS:  Brain MRI 09/20/2014 IMPRESSION: 1. No acute infarct. 2. Chronic right MCA territory infarct. 3. Subtle T2 hyperintensity in the left hippocampus and left temporal lobe cortex versus artifact. Recent seizure activity is a consideration.  COGNITION: Overall cognitive status: History of cognitive impairments - at baseline - reports quicker to anger/verbalize emotions/sometimes overstimulated with sounds and distractions   SENSATION: Light touch: WFL - LLE hypersensitive/question of allodynia  COORDINATION: Mildly dysmetric LLE  EDEMA:  None significant in BLE  MUSCLE TONE: LLE: Modifed Ashworth Scale 1+ = Slight increase in muscle tone, manifested by a catch, followed by minimal resistance throughout the remainder (less than half) of the ROM  POSTURE: rounded shoulders, flexed trunk , and weight shift right - mildly flexed trunk and right weight shift in standing and with ambulation, L shoulder more rounded than R w/ mild shoulder hike  LOWER EXTREMITY ROM:     Active  Right Eval Left Eval  Hip flexion WNL WFL  Hip extension    Hip abduction    Hip adduction    Hip internal rotation    Hip external rotation    Knee flexion    Knee extension    Ankle dorsiflexion  2+  Ankle plantarflexion    Ankle inversion    Ankle eversion     (Blank rows = not  tested)  LOWER EXTREMITY MMT:    MMT Right Eval Left Eval  Hip flexion    Hip extension    Hip abduction    Hip adduction    Hip internal rotation    Hip external rotation    Knee flexion    Knee extension    Ankle dorsiflexion    Ankle plantarflexion    Ankle inversion    Ankle eversion    (Blank rows = not tested)  BED MOBILITY:  Findings: Sit to supine Complete Independence Supine to sit Complete Independence Rolling to Right Complete Independence Rolling to Left Complete Independence - needs increased time and momentum for L and R Ind w/ features of tempurpedic style bed  TRANSFERS: Sit to stand: SBA  Assistive device utilized: Single point cane     Stand to sit: SBA  Assistive device utilized: Single point cane     Chair to chair: SBA  Assistive device utilized: Single point cane       RAMP:  Not tested  CURB:  Not tested  STAIRS: Not tested GAIT: Findings: Distance walked: various clinic  distances and Comments: Pt has mild right reliance w/ SPC, no foot scuff/toe catch even without AFO, shortened stride and slightly diminished flexion pattern of LLE  FUNCTIONAL TESTS:  5 times sit to stand: 18.89 sec w/ heavy right trunk shortening and RUE reliance w/ L toe down vs full weight bearing  PATIENT SURVEYS:  ABC scale: The Activities-Specific Balance Confidence (ABC) Scale TBA                                                                                                                              TREATMENT DATE: 03/15/2024  -ABC Scale:  66.875% -Assessed posterior [place] AFO w/ medial strut - able to fit in pt's current shoe w/ custom orthotic but upon ambulating x90 ft pt has worsened inverted foot posture in stance and shortened step to stride, he reports great discomfort during foot placement.  Showed pt other bracing options including 2 types of foot-up braces vs [place] AFO w/ lateral strut (possible benefit to eversion?) and discussed ankle  stability/proprioceptive brace vs partial wedge for static foot positioning.  He is more open to having orthotic possibly modified to improve static foot posture and less open to AFO options.  Wrote note for podiatrist who provided prior custom orthotic:   To whom it may concern,  Could you evaluate left inverted ankle posture to determine benefit of lateral orthotic build up/wedge (possible addition to current orthotic) to improve foot position and protect lateral ankle ligaments?  Thank you, Daved Bull, PT, DPT  Reviewed and provided HEP: Access Code: [facility] URL: [email or web address] Date: 03/15/2024 Prepared by: Daved Bull  Exercises - Seated Toe Raise  - 1-2 x daily - 4 x weekly - 2 sets - 10 reps - 2-3 seconds hold - Ankle Inversion Eversion Towel Slide  - 1-2 x daily - 4 x weekly - 2 sets - 10 reps - Seated Ankle Circles  - 1-2 x daily - 4 x weekly - 2 sets - 10 reps - Seated Heel Raise  - 1-2 x daily - 4 x weekly - 2 sets - 10 reps - 2-3 seconds hold - Seated Ankle Inversion Eversion PROM  - 1 x daily - 7 x weekly - 2 sets - 10 reps - Staggered Sit-to-Stand  - 1 x daily - 4-5 x weekly - 2-3 sets - 10 reps  PATIENT EDUCATION: Education details: Awaiting OT referral but request placed.  Initial HEP and goals of foot positioning.  See above for further. Person educated: Patient and Spouse - wife joins at end of session Education method: Explanation and Demonstration Education comprehension: verbalized understanding and needs further education  HOME EXERCISE PROGRAM: Access Code: [facility] URL: [email or web address] Date: 03/15/2024 Prepared by: Daved Bull  Exercises - Seated Toe Raise  - 1-2 x daily - 4 x weekly - 2 sets - 10 reps - 2-3 seconds hold - Ankle Inversion Eversion Towel Slide  - 1-2 x daily - 4 x weekly -  2 sets - 10 reps - Seated Ankle Circles  - 1-2 x daily - 4 x weekly - 2 sets - 10 reps - Seated Heel Raise   - 1-2 x daily - 4 x weekly - 2 sets - 10 reps - 2-3 seconds hold - Seated Ankle Inversion Eversion PROM  - 1 x daily - 7 x weekly - 2 sets - 10 reps - Staggered Sit-to-Stand  - 1 x daily - 4-5 x weekly - 2-3 sets - 10 reps  GOALS: Goals reviewed with patient? Yes  SHORT TERM GOALS: Target date: 04/08/2024  Pt will be independent and compliant with introductory strength and balance HEP in order to maintain functional progress and improve mobility. Baseline:  To be established. Goal status: INITIAL  2.  Pt will demonstrate adequate balance strategies to maintain upright with forward/overhead/and floor reaching. Baseline:  Very narrow limits of stability - several near falls related to this. Goal status: INITIAL  3.  Pt will be independent with desensitization strategies for LLE hypersensitivity management. Baseline: To be provided. Goal status: INITIAL  LONG TERM GOALS: Target date: 05/06/2024  Pt will be independent and compliant with advanced and finalized strength and balance HEP in order to maintain functional progress and improve mobility. Baseline: To be established. Goal status: INITIAL  2.  Patient will improve ABC Scale score to >/=76% in order to demonstrate decreased risk for falls and increased confidence in balance. Baseline: 66.875% (10/14) Goal status: INITIAL  3.  Patient will demonstrate floor recovery w/ no more than SBA in order to improve safety and home management. Baseline: Reports recent minA even w/ support surface Goal status: INITIAL  4.  Pt will decrease 5xSTS to </=12 seconds w/ improved midline mechanics in order to demonstrate decreased risk for falls and improved functional bilateral LE strength and power. Baseline: 18.89 sec w/ heavy right trunk shortening and RUE reliance w/ L toe down vs full weight bearing Goal status: INITIAL  ASSESSMENT:  CLINICAL IMPRESSION: Assessed ABC Scale this session with pt demonstrating decreased confidence in higher  level balance skills.  He maintains poor inverted foot posture in stance phase of gait even with AFO.  Due to improved knee hyperextension and general foot clearance pt may benefit more from static foot positioning with wedge addition or other modification to custom AFO - provided note for podiatrist for input.  He has functional eversion weakness but maintains some flexibility of this plane of motion so HEP established to optimize this.  Will further work on ankle control to improve balance and gait mechanics in future sessions.  Continue per POC.  OBJECTIVE IMPAIRMENTS: Abnormal gait, decreased activity tolerance, decreased balance, decreased cognition, decreased coordination, decreased endurance, decreased knowledge of use of DME, decreased strength, impaired sensation, impaired tone, improper body mechanics, and postural dysfunction.   ACTIVITY LIMITATIONS: carrying, lifting, bending, squatting, stairs, transfers, reach over head, and locomotion level  PARTICIPATION LIMITATIONS: laundry, shopping, and community activity  PERSONAL FACTORS: Age, Fitness, Past/current experiences, Time since onset of injury/illness/exacerbation, and 1-2 comorbidities: HTN, Afib are also affecting patient's functional outcome.   REHAB POTENTIAL: Good  CLINICAL DECISION MAKING: Stable/uncomplicated  EVALUATION COMPLEXITY: Moderate  PLAN:  PT FREQUENCY: 2x/week  PT DURATION: 8 weeks  PLANNED INTERVENTIONS: 97164- PT Re-evaluation, 97750- Physical Performance Testing, 97110-Therapeutic exercises, 97530- Therapeutic activity, [id number]- Neuromuscular re-education, 97535- Self Care, [id number]- Manual therapy, [id number]- Gait training, [phone]- Orthotic Initial, [phone]- Orthotic/Prosthetic subsequent, [phone]- Aquatic Therapy, [phone]- Electrical stimulation (manual), Patient/Family education, Balance training, Stair training,  Taping, Joint mobilization, Vestibular training, and DME instructions  PLAN FOR NEXT SESSION: Updates from  podiatrist regarding orthotic modifications?  Gait training w/ SPC.  Floor recovery - incorporate tight space setup.  LLE NMR/strength/reaction time.  Expand strength and balance HEP.  Core strength.  Reaching floor/forward/overhead.  Desensitization strategies for LLE.  Ascent - ramp and stairs.  Perturbations/resisted gait/farmer's carry.  Closed chain ankle mobility/mobilization for eversion/DF - shoe off.  Pt declines offer of aquatic therapy at evaluation.  He uses a custom orthotic insert - add partial wedge to bias eversion?  Daved KATHEE Bull, PT, DPT 03/15/2024, 3:55 PM

## 2024-03-15 NOTE — Telephone Encounter (Signed)
 Donald Russell, NP  Donald [name]  was evaluated by PT on 03/09/2024.  The patient would benefit from OT evaluation for LUE deficits post CVA including poor motor control/planning and force grading.   If you agree, please place an order in [facility] workque in [facility] or fax the order to [phone].  Thank you,  Daved Bull, PT, DPT  [facility] [address] [place], [place]  [address] Phone:  [phone] Fax:  [phone]

## 2024-03-15 NOTE — Addendum Note (Signed)
 Addended by: SHERRYL DUWAINE SQUIBB on: 03/15/2024 04:24 PM   Modules accepted: Orders

## 2024-03-16 DIAGNOSIS — M216X9 Other acquired deformities of unspecified foot: Secondary | ICD-10-CM | POA: Diagnosis not present

## 2024-03-16 DIAGNOSIS — I739 Peripheral vascular disease, unspecified: Secondary | ICD-10-CM | POA: Diagnosis not present

## 2024-03-16 DIAGNOSIS — M6281 Muscle weakness (generalized): Secondary | ICD-10-CM | POA: Diagnosis not present

## 2024-03-16 DIAGNOSIS — E1151 Type 2 diabetes mellitus with diabetic peripheral angiopathy without gangrene: Secondary | ICD-10-CM | POA: Diagnosis not present

## 2024-03-16 DIAGNOSIS — M19072 Primary osteoarthritis, left ankle and foot: Secondary | ICD-10-CM | POA: Diagnosis not present

## 2024-03-16 DIAGNOSIS — Z9181 History of falling: Secondary | ICD-10-CM | POA: Diagnosis not present

## 2024-03-16 DIAGNOSIS — M19071 Primary osteoarthritis, right ankle and foot: Secondary | ICD-10-CM | POA: Diagnosis not present

## 2024-03-16 DIAGNOSIS — L851 Acquired keratosis [keratoderma] palmaris et plantaris: Secondary | ICD-10-CM | POA: Diagnosis not present

## 2024-03-18 ENCOUNTER — Ambulatory Visit: Admitting: Physical Therapy

## 2024-03-18 ENCOUNTER — Encounter: Payer: Self-pay | Admitting: Physical Therapy

## 2024-03-18 DIAGNOSIS — R29818 Other symptoms and signs involving the nervous system: Secondary | ICD-10-CM | POA: Diagnosis not present

## 2024-03-18 DIAGNOSIS — R2681 Unsteadiness on feet: Secondary | ICD-10-CM | POA: Diagnosis not present

## 2024-03-18 DIAGNOSIS — R209 Unspecified disturbances of skin sensation: Secondary | ICD-10-CM

## 2024-03-18 DIAGNOSIS — I69354 Hemiplegia and hemiparesis following cerebral infarction affecting left non-dominant side: Secondary | ICD-10-CM | POA: Diagnosis not present

## 2024-03-18 DIAGNOSIS — R29898 Other symptoms and signs involving the musculoskeletal system: Secondary | ICD-10-CM

## 2024-03-18 DIAGNOSIS — R278 Other lack of coordination: Secondary | ICD-10-CM | POA: Diagnosis not present

## 2024-03-18 NOTE — Therapy (Signed)
 OUTPATIENT PHYSICAL THERAPY NEURO TREATMENT   Patient Name: Donald [name] MRN: [id number] DOB:August 15, 1945, 78 y.o., male Today's Date: 03/18/2024   PCP: Donald Righter, MD REFERRING PROVIDER: Sherryl Bouchard, NP  END OF SESSION:  PT End of Session - 03/18/24 1500     Visit Number 3    Number of Visits 17   16 + eval   Date for Recertification  05/13/24   pushed out due to scheduling delay   Authorization Type Medicare A&B w/ [employer] supplement    Progress Note Due on Visit 10    PT Start Time 1448    PT Stop Time 1541    PT Time Calculation (min) 53 min    Equipment Utilized During Treatment Gait belt    Activity Tolerance Patient tolerated treatment well    Behavior During Therapy WFL for tasks assessed/performed         Past Medical History:  Diagnosis Date   Diabetes mellitus without complication (HCC)    Dysrhythmia    Hyperlipemia    Hypertension    large right middle cerebral artery infarct, 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 s/p IV tPA, b. source unknown, c. loop recorder placed   Snoring 07/03/2015   Past Surgical History:  Procedure Laterality Date   LOOP RECORDER IMPLANT  06/07/2013   MDT LinQ implanted by Dr [name] for cryptogenic stroke   LOOP RECORDER IMPLANT N/A 06/07/2013   Procedure: LOOP RECORDER IMPLANT;  Surgeon: Donald JONETTA Kelsie, MD;  Location: [facility] CATH [facility];  Service: Cardiovascular;  Laterality: N/A;   TEE WITHOUT CARDIOVERSION N/A 06/07/2013   Procedure: TRANSESOPHAGEAL ECHOCARDIOGRAM (TEE);  Surgeon: Donald VEAR Moose, MD;  Location: [facility];  Service: Cardiovascular;  Laterality: N/A;   TONSILLECTOMY     TRANSANAL HEMORRHOIDAL DEARTERIALIZATION N/A 02/18/2023   Procedure: TRANSANAL HEMORRHOIDAL DEARTERIALIZATION;  Surgeon: Donald Hila, MD;  Location: [facility] ORS;  Service: General;  Laterality: N/A;   UMBILICAL HERNIA REPAIR     Patient Active Problem List   Diagnosis Date Noted   Permanent atrial fibrillation (HCC) 05/17/2019   Acquired  thrombophilia 05/17/2019   Snoring 07/03/2015   Persistent atrial fibrillation (HCC) 04/16/2015   Hypokalemia 09/20/2014   HTN (hypertension) 09/19/2014   HLD (hyperlipidemia) 09/19/2014   Diabetes mellitus without complication (HCC) 09/19/2014   Jerking 09/19/2014   Alterations of sensations, late effect of cerebrovascular disease 11/07/2013   Disturbances of vision, late effect of cerebrovascular disease 11/07/2013   Sinus bradycardia 08/08/2013   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HCC) 07/26/2013   Left-sided neglect 07/26/2013   Small vessel disease 06/07/2013   Obesity, unspecified 06/07/2013   Cerebral infarction (HCC) 06/07/2013   large right middle cerebral artery infarct, 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   Hypertension 06/03/2013   Diabetes (HCC) 06/03/2013    ONSET DATE: 06/03/2013 (CVA)  REFERRING DIAG: P30.645 (ICD-10-CM) - Hemiparesis affecting left side as late effect of cerebrovascular accident (HCC)  THERAPY DIAG:  Hemiplegia and hemiparesis following cerebral infarction affecting left non-dominant side (HCC)  Other symptoms and signs involving the nervous system  Other symptoms and signs involving the musculoskeletal system  Other lack of coordination  Unsteadiness on feet  Unspecified disturbances of skin sensation  Rationale for Evaluation and Treatment: Rehabilitation  SUBJECTIVE:  SUBJECTIVE STATEMENT: [name]  He reports podiatrist has recommended a Moore balance brace for the ankle but he is hesitant about being fitted for this out of concern for not over bracing.  He continues to ambulate w/ SPC.  No falls or pain today.  No lightheadedness/dizziness. Pt accompanied by: self (Wife - waiting in car)  PERTINENT HISTORY: R MCA infarct 06/03/2013 S/p tPA, HTN, DM2, Afib,  had 1 seizure in 2017  PAIN:  Are you having pain? No  PRECAUTIONS: Fall and Other: loop recorder  RED FLAGS: None   WEIGHT BEARING RESTRICTIONS: No  FALLS: Has patient fallen in last 6 months? Yes. Number of falls 3  LIVING ENVIRONMENT: Lives with: lives with their spouse Lives in: House/apartment Stairs: No Has following equipment at home: Single point cane  PLOF: Requires assistive device for independence  PATIENT GOALS: To work on reaction time and getting off the floor  OBJECTIVE:  Note: Objective measures were completed at Evaluation unless otherwise noted.  DIAGNOSTIC FINDINGS:  Brain MRI 09/20/2014 IMPRESSION: 1. No acute infarct. 2. Chronic right MCA territory infarct. 3. Subtle T2 hyperintensity in the left hippocampus and left temporal lobe cortex versus artifact. Recent seizure activity is a consideration.  COGNITION: Overall cognitive status: History of cognitive impairments - at baseline - reports quicker to anger/verbalize emotions/sometimes overstimulated with sounds and distractions   SENSATION: Light touch: WFL - LLE hypersensitive/question of allodynia  COORDINATION: Mildly dysmetric LLE  EDEMA:  None significant in BLE  MUSCLE TONE: LLE: Modifed Ashworth Scale 1+ = Slight increase in muscle tone, manifested by a catch, followed by minimal resistance throughout the remainder (less than half) of the ROM  POSTURE: rounded shoulders, flexed trunk , and weight shift right - mildly flexed trunk and right weight shift in standing and with ambulation, L shoulder more rounded than R w/ mild shoulder hike  LOWER EXTREMITY ROM:     Active  Right Eval Left Eval  Hip flexion WNL WFL  Hip extension    Hip abduction    Hip adduction    Hip internal rotation    Hip external rotation    Knee flexion    Knee extension    Ankle dorsiflexion  2+  Ankle plantarflexion    Ankle inversion    Ankle eversion     (Blank rows = not tested)  LOWER  EXTREMITY MMT:    MMT Right Eval Left Eval  Hip flexion    Hip extension    Hip abduction    Hip adduction    Hip internal rotation    Hip external rotation    Knee flexion    Knee extension    Ankle dorsiflexion    Ankle plantarflexion    Ankle inversion    Ankle eversion    (Blank rows = not tested)  BED MOBILITY:  Findings: Sit to supine Complete Independence Supine to sit Complete Independence Rolling to Right Complete Independence Rolling to Left Complete Independence - needs increased time and momentum for L and R Ind w/ features of tempurpedic style bed  TRANSFERS: Sit to stand: SBA  Assistive device utilized: Single point cane     Stand to sit: SBA  Assistive device utilized: Single point cane     Chair to chair: SBA  Assistive device utilized: Single point cane       RAMP:  Not tested  CURB:  Not tested  STAIRS: Not tested GAIT: Findings: Distance walked: various clinic distances and Comments: Pt has mild right  reliance w/ SPC, no foot scuff/toe catch even without AFO, shortened stride and slightly diminished flexion pattern of LLE  FUNCTIONAL TESTS:  5 times sit to stand: 18.89 sec w/ heavy right trunk shortening and RUE reliance w/ L toe down vs full weight bearing  PATIENT SURVEYS:  ABC scale: The Activities-Specific Balance Confidence (ABC) Scale TBA                                                                                                                              TREATMENT DATE: 03/18/2024  -Discussed bracing options and pt okay w/ PT reaching out to orthotist w/ pt input during communication.  -Cut lateral heel wedge to place under current orthotic to assess biasing calcaneal eversion - successfully moderately improved lateral ankle thrust in stance, but biases toe down which places pt at increased risk of toe catch (assessed over 120 ft) - discussed heel cup vs more firm ankle brace like podiatrist suggests -Single leg bridge x12 alt > x8  alt; inc lateral instability in left stance -Deadbug x3-4 reps w/ multimodal cues -Hook-lying 5lb bimanual overhead lift x12 in midrange for left shoulder comfort -Hook-lying heel extension taps x10 alt LE > 90/90 heel taps x20, begins to lose TrA activation around rep 9-10 -90/90 heel taps x6 alt LE -Seated crossbody toes to rows 3x5 each side -Reviewed eversion stretch of L ankle  PATIENT EDUCATION: Education details: Continue HEP and goals of foot positioning.  See above for further. Person educated: Patient and Spouse - wife joins at end of session Education method: Explanation and Demonstration Education comprehension: verbalized understanding and needs further education  HOME EXERCISE PROGRAM: Access Code: [facility] URL: [email or web address] Date: 03/15/2024 Prepared by: Daved Bull  Exercises - Seated Toe Raise  - 1-2 x daily - 4 x weekly - 2 sets - 10 reps - 2-3 seconds hold - Ankle Inversion Eversion Towel Slide  - 1-2 x daily - 4 x weekly - 2 sets - 10 reps - Seated Ankle Circles  - 1-2 x daily - 4 x weekly - 2 sets - 10 reps - Seated Heel Raise  - 1-2 x daily - 4 x weekly - 2 sets - 10 reps - 2-3 seconds hold - Seated Ankle Inversion Eversion PROM  - 1 x daily - 7 x weekly - 2 sets - 10 reps - Staggered Sit-to-Stand  - 1 x daily - 4-5 x weekly - 2-3 sets - 10 reps  GOALS: Goals reviewed with patient? Yes  SHORT TERM GOALS: Target date: 04/08/2024  Pt will be independent and compliant with introductory strength and balance HEP in order to maintain functional progress and improve mobility. Baseline:  To be established. Goal status: INITIAL  2.  Pt will demonstrate adequate balance strategies to maintain upright with forward/overhead/and floor reaching. Baseline:  Very narrow limits of stability - several near falls related to this. Goal status: INITIAL  3.  Pt will be independent with  desensitization strategies for LLE hypersensitivity  management. Baseline: To be provided. Goal status: INITIAL  LONG TERM GOALS: Target date: 05/06/2024  Pt will be independent and compliant with advanced and finalized strength and balance HEP in order to maintain functional progress and improve mobility. Baseline: To be established. Goal status: INITIAL  2.  Patient will improve ABC Scale score to >/=76% in order to demonstrate decreased risk for falls and increased confidence in balance. Baseline: 66.875% (10/14) Goal status: INITIAL  3.  Patient will demonstrate floor recovery w/ no more than SBA in order to improve safety and home management. Baseline: Reports recent minA even w/ support surface Goal status: INITIAL  4.  Pt will decrease 5xSTS to </=12 seconds w/ improved midline mechanics in order to demonstrate decreased risk for falls and improved functional bilateral LE strength and power. Baseline: 18.89 sec w/ heavy right trunk shortening and RUE reliance w/ L toe down vs full weight bearing Goal status: INITIAL  ASSESSMENT:  CLINICAL IMPRESSION: Continued ankle positioning assessment today with pt demonstrating flexibility with improved left stance w/ lateral heel wedge, but this is not appropriate due to plantarflexion bias.  He does demonstrate better static positioning with wedge bias and eversion PROM and stretching reviewed today.  Initiated supine core strengthening this visit with pt very challenged by maintaining LLE in midline and prolonged TrA activation.  He would benefit from further core incorporation to improve righting reactions for benefit of upright stability.  Will begin to address floor recovery next session and follow-up with orthotist contacted today regarding ankle stability options.  Continue per POC.  OBJECTIVE IMPAIRMENTS: Abnormal gait, decreased activity tolerance, decreased balance, decreased cognition, decreased coordination, decreased endurance, decreased knowledge of use of DME, decreased strength,  impaired sensation, impaired tone, improper body mechanics, and postural dysfunction.   ACTIVITY LIMITATIONS: carrying, lifting, bending, squatting, stairs, transfers, reach over head, and locomotion level  PARTICIPATION LIMITATIONS: laundry, shopping, and community activity  PERSONAL FACTORS: Age, Fitness, Past/current experiences, Time since onset of injury/illness/exacerbation, and 1-2 comorbidities: HTN, Afib are also affecting patient's functional outcome.   REHAB POTENTIAL: Good  CLINICAL DECISION MAKING: Stable/uncomplicated  EVALUATION COMPLEXITY: Moderate  PLAN:  PT FREQUENCY: 2x/week  PT DURATION: 8 weeks  PLANNED INTERVENTIONS: [id number]- PT Re-evaluation, 97750- Physical Performance Testing, 97110-Therapeutic exercises, 97530- Therapeutic activity, [id number]- Neuromuscular re-education, 97535- Self Care, [id number]- Manual therapy, [id number]- Gait training, [phone]- Orthotic Initial, [phone]- Orthotic/Prosthetic subsequent, [phone]- Aquatic Therapy, [phone]- Electrical stimulation (manual), Patient/Family education, Balance training, Stair training, Taping, Joint mobilization, Vestibular training, and DME instructions  PLAN FOR NEXT SESSION: Gait training w/ SPC.  Floor recovery - incorporate tight space setup.  LLE NMR/strength/reaction time.  Expand strength and balance HEP.  Core strength.  Reaching floor/forward/overhead.  Desensitization strategies for LLE.  Ascent - ramp and stairs.  Perturbations/resisted gait/farmer's carry.  Closed chain ankle mobility/mobilization for eversion/DF - shoe off.  Hamstring stretching.  Did Rjay Revolorio hear from orthotist?  Pt declines offer of aquatic therapy at evaluation.  Daved KATHEE Bull, PT, DPT 03/18/2024, 5:13 PM

## 2024-03-21 ENCOUNTER — Encounter: Payer: Self-pay | Admitting: Physical Therapy

## 2024-03-21 ENCOUNTER — Ambulatory Visit: Admitting: Physical Therapy

## 2024-03-21 DIAGNOSIS — R2681 Unsteadiness on feet: Secondary | ICD-10-CM | POA: Diagnosis not present

## 2024-03-21 DIAGNOSIS — R278 Other lack of coordination: Secondary | ICD-10-CM | POA: Diagnosis not present

## 2024-03-21 DIAGNOSIS — I69354 Hemiplegia and hemiparesis following cerebral infarction affecting left non-dominant side: Secondary | ICD-10-CM | POA: Diagnosis not present

## 2024-03-21 DIAGNOSIS — R29818 Other symptoms and signs involving the nervous system: Secondary | ICD-10-CM | POA: Diagnosis not present

## 2024-03-21 DIAGNOSIS — R29898 Other symptoms and signs involving the musculoskeletal system: Secondary | ICD-10-CM

## 2024-03-21 DIAGNOSIS — R209 Unspecified disturbances of skin sensation: Secondary | ICD-10-CM | POA: Diagnosis not present

## 2024-03-21 NOTE — Therapy (Unsigned)
 OUTPATIENT PHYSICAL THERAPY NEURO TREATMENT   Patient Name: Donald [name] MRN: [id number] DOB:September 29, 1945, 78 y.o., male Today's Date: 03/21/2024   PCP: Kip Righter, MD REFERRING PROVIDER: Sherryl Bouchard, NP  END OF SESSION:  PT End of Session - 03/21/24 1458     Visit Number 4    Number of Visits 17   16 + eval   Date for Recertification  05/13/24   pushed out due to scheduling delay   Authorization Type Medicare A&B w/ [employer] supplement    Progress Note Due on Visit 10    PT Start Time 1450    PT Stop Time 1540    PT Time Calculation (min) 50 min    Equipment Utilized During Treatment Gait belt    Activity Tolerance Patient tolerated treatment well    Behavior During Therapy WFL for tasks assessed/performed         Past Medical History:  Diagnosis Date   Diabetes mellitus without complication (HCC)    Dysrhythmia    Hyperlipemia    Hypertension    large right middle cerebral artery infarct, 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 s/p IV tPA, b. source unknown, c. loop recorder placed   Snoring 07/03/2015   Past Surgical History:  Procedure Laterality Date   LOOP RECORDER IMPLANT  06/07/2013   MDT LinQ implanted by Dr Kelsie for cryptogenic stroke   LOOP RECORDER IMPLANT N/A 06/07/2013   Procedure: LOOP RECORDER IMPLANT;  Surgeon: Lynwood JONETTA Kelsie, MD;  Location: [facility] CATH LAB;  Service: Cardiovascular;  Laterality: N/A;   TEE WITHOUT CARDIOVERSION N/A 06/07/2013   Procedure: TRANSESOPHAGEAL ECHOCARDIOGRAM (TEE);  Surgeon: Leim VEAR Moose, MD;  Location: [facility] ENDOSCOPY;  Service: Cardiovascular;  Laterality: N/A;   TONSILLECTOMY     TRANSANAL HEMORRHOIDAL DEARTERIALIZATION N/A 02/18/2023   Procedure: TRANSANAL HEMORRHOIDAL DEARTERIALIZATION;  Surgeon: Debby Hila, MD;  Location: [facility] ORS;  Service: General;  Laterality: N/A;   UMBILICAL HERNIA REPAIR     Patient Active Problem List   Diagnosis Date Noted   Permanent atrial fibrillation (HCC) 05/17/2019   Acquired  thrombophilia 05/17/2019   Snoring 07/03/2015   Persistent atrial fibrillation (HCC) 04/16/2015   Hypokalemia 09/20/2014   HTN (hypertension) 09/19/2014   HLD (hyperlipidemia) 09/19/2014   Diabetes mellitus without complication (HCC) 09/19/2014   Jerking 09/19/2014   Alterations of sensations, late effect of cerebrovascular disease 11/07/2013   Disturbances of vision, late effect of cerebrovascular disease 11/07/2013   Sinus bradycardia 08/08/2013   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HCC) 07/26/2013   Left-sided neglect 07/26/2013   Small vessel disease 06/07/2013   Obesity, unspecified 06/07/2013   Cerebral infarction (HCC) 06/07/2013   large right middle cerebral artery infarct, 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   Hypertension 06/03/2013   Diabetes (HCC) 06/03/2013    ONSET DATE: 06/03/2013 (CVA)  REFERRING DIAG: P30.645 (ICD-10-CM) - Hemiparesis affecting left side as late effect of cerebrovascular accident (HCC)  THERAPY DIAG:  Hemiplegia and hemiparesis following cerebral infarction affecting left non-dominant side (HCC)  Other symptoms and signs involving the nervous system  Other symptoms and signs involving the musculoskeletal system  Other lack of coordination  Unsteadiness on feet  Unspecified disturbances of skin sensation  Rationale for Evaluation and Treatment: Rehabilitation  SUBJECTIVE:  SUBJECTIVE STATEMENT: [name]  He reports podiatrist has recommended a Moore balance brace for the ankle but he is hesitant about being fitted for this out of concern for not over bracing.  He continues to ambulate w/ SPC.  No falls or pain today.  No lightheadedness/dizziness. Pt accompanied by: self (Wife - waiting in car)  PERTINENT HISTORY: R MCA infarct 06/03/2013 S/p tPA, HTN, DM2, Afib,  had 1 seizure in 2017  PAIN:  Are you having pain? No  PRECAUTIONS: Fall and Other: loop recorder  RED FLAGS: None   WEIGHT BEARING RESTRICTIONS: No  FALLS: Has patient fallen in last 6 months? Yes. Number of falls 3  LIVING ENVIRONMENT: Lives with: lives with their spouse Lives in: House/apartment Stairs: No Has following equipment at home: Single point cane  PLOF: Requires assistive device for independence  PATIENT GOALS: To work on reaction time and getting off the floor  OBJECTIVE:  Note: Objective measures were completed at Evaluation unless otherwise noted.  DIAGNOSTIC FINDINGS:  Brain MRI 09/20/2014 IMPRESSION: 1. No acute infarct. 2. Chronic right MCA territory infarct. 3. Subtle T2 hyperintensity in the left hippocampus and left temporal lobe cortex versus artifact. Recent seizure activity is a consideration.  COGNITION: Overall cognitive status: History of cognitive impairments - at baseline - reports quicker to anger/verbalize emotions/sometimes overstimulated with sounds and distractions   SENSATION: Light touch: WFL - LLE hypersensitive/question of allodynia  COORDINATION: Mildly dysmetric LLE  EDEMA:  None significant in BLE  MUSCLE TONE: LLE: Modifed Ashworth Scale 1+ = Slight increase in muscle tone, manifested by a catch, followed by minimal resistance throughout the remainder (less than half) of the ROM  POSTURE: rounded shoulders, flexed trunk , and weight shift right - mildly flexed trunk and right weight shift in standing and with ambulation, L shoulder more rounded than R w/ mild shoulder hike  LOWER EXTREMITY ROM:     Active  Right Eval Left Eval  Hip flexion WNL WFL  Hip extension    Hip abduction    Hip adduction    Hip internal rotation    Hip external rotation    Knee flexion    Knee extension    Ankle dorsiflexion  2+  Ankle plantarflexion    Ankle inversion    Ankle eversion     (Blank rows = not tested)  LOWER  EXTREMITY MMT:    MMT Right Eval Left Eval  Hip flexion    Hip extension    Hip abduction    Hip adduction    Hip internal rotation    Hip external rotation    Knee flexion    Knee extension    Ankle dorsiflexion    Ankle plantarflexion    Ankle inversion    Ankle eversion    (Blank rows = not tested)  BED MOBILITY:  Findings: Sit to supine Complete Independence Supine to sit Complete Independence Rolling to Right Complete Independence Rolling to Left Complete Independence - needs increased time and momentum for L and R Ind w/ features of tempurpedic style bed  TRANSFERS: Sit to stand: SBA  Assistive device utilized: Single point cane     Stand to sit: SBA  Assistive device utilized: Single point cane     Chair to chair: SBA  Assistive device utilized: Single point cane       RAMP:  Not tested  CURB:  Not tested  STAIRS: Not tested GAIT: Findings: Distance walked: various clinic distances and Comments: Pt has mild right  reliance w/ SPC, no foot scuff/toe catch even without AFO, shortened stride and slightly diminished flexion pattern of LLE  FUNCTIONAL TESTS:  5 times sit to stand: 18.89 sec w/ heavy right trunk shortening and RUE reliance w/ L toe down vs full weight bearing  PATIENT SURVEYS:  ABC scale: The Activities-Specific Balance Confidence (ABC) Scale TBA                                                                                                                              TREATMENT DATE: 03/21/2024  -Assessed bilateral dorsum of feet and toes - mild small (~1 cm) on left second toe and small scratch-like area on right second toe, mild discoloration of toes and dorsum of foot that partially blanches - podiatrist aware and following pt for blood flow concerns, has had recent doplar -PT provides passive stretching in hip and knee flexion and hamstring stretch > reviewed SKTC w/ towel and seated hamstring stretch for home -Briefly reviewed eversion  stretch of left foot -Repeated kneel to 8 bench w/ BUE support x6 CGA -Practiced controlled lower to tall kneel on floor using BUE support in tight setup using R then L LE to recover to standing, able to smoothly squat pivot to left in sitting but able to stand all the way up when leading w/ RLE CGA-minA for both > discussed emotional processing and ways to direct care and redirection focus outward vs on inner feelings as pt reports high anger when he falls due to frustration and this impacting his floor recovery abilities  PATIENT EDUCATION: Education details: Continue HEP w/ additions and goals of foot positioning.  See above for further.  Discussed using mole skin or corn pads to protect toes if rubbing in shoes and report to podiatrist - monitor redness progression.  See above for further. Person educated: Patient and Spouse - wife joins at end of session Education method: Explanation and Demonstration Education comprehension: verbalized understanding and needs further education  HOME EXERCISE PROGRAM: Access Code: [facility] URL: [email or web address] Date: 03/21/2024 Prepared by: Daved Bull  Exercises - Seated Toe Raise  - 1-2 x daily - 4 x weekly - 2 sets - 10 reps - 2-3 seconds hold - Ankle Inversion Eversion Towel Slide  - 1-2 x daily - 4 x weekly - 2 sets - 10 reps - Seated Ankle Circles  - 1-2 x daily - 4 x weekly - 2 sets - 10 reps - Seated Heel Raise  - 1-2 x daily - 4 x weekly - 2 sets - 10 reps - 2-3 seconds hold - Seated Ankle Inversion Eversion PROM  - 1 x daily - 7 x weekly - 2 sets - 10 reps - Staggered Sit-to-Stand  - 1 x daily - 4-5 x weekly - 2-3 sets - 10 reps - Seated Hamstring Stretch  - 1 x daily - 4-5 x weekly - 1 sets - 3-4 reps - 45 seconds hold - Hooklying Single  Knee to Chest Stretch with Towel  - 1 x daily - 4-5 x weekly - 1 sets - 3-4 reps - 45 seconds hold  GOALS: Goals reviewed with patient? Yes  SHORT TERM GOALS: Target date:  04/08/2024  Pt will be independent and compliant with introductory strength and balance HEP in order to maintain functional progress and improve mobility. Baseline:  To be established. Goal status: INITIAL  2.  Pt will demonstrate adequate balance strategies to maintain upright with forward/overhead/and floor reaching. Baseline:  Very narrow limits of stability - several near falls related to this. Goal status: INITIAL  3.  Pt will be independent with desensitization strategies for LLE hypersensitivity management. Baseline: To be provided. Goal status: INITIAL  LONG TERM GOALS: Target date: 05/06/2024  Pt will be independent and compliant with advanced and finalized strength and balance HEP in order to maintain functional progress and improve mobility. Baseline: To be established. Goal status: INITIAL  2.  Patient will improve ABC Scale score to >/=76% in order to demonstrate decreased risk for falls and increased confidence in balance. Baseline: 66.875% (10/14) Goal status: INITIAL  3.  Patient will demonstrate floor recovery w/ no more than SBA in order to improve safety and home management. Baseline: Reports recent minA even w/ support surface Goal status: INITIAL  4.  Pt will decrease 5xSTS to </=12 seconds w/ improved midline mechanics in order to demonstrate decreased risk for falls and improved functional bilateral LE strength and power. Baseline: 18.89 sec w/ heavy right trunk shortening and RUE reliance w/ L toe down vs full weight bearing Goal status: INITIAL  ASSESSMENT:  CLINICAL IMPRESSION: Focus of skilled PT session on addressing floor recovery from tall kneel position.  He only requires minA w/ LLE leading.  His biggest challenge with floor recovery is processing his anger following a fall which hinders his ability to problem solve.  Strategies discussed today to improve this including focus on external targets and directing wife through his plan to get up and how she  can best help in that scenario.  PT continuing to work with Technical sales engineer and podiatrist to improve ankle positioning.  Continue per POC.  OBJECTIVE IMPAIRMENTS: Abnormal gait, decreased activity tolerance, decreased balance, decreased cognition, decreased coordination, decreased endurance, decreased knowledge of use of DME, decreased strength, impaired sensation, impaired tone, improper body mechanics, and postural dysfunction.   ACTIVITY LIMITATIONS: carrying, lifting, bending, squatting, stairs, transfers, reach over head, and locomotion level  PARTICIPATION LIMITATIONS: laundry, shopping, and community activity  PERSONAL FACTORS: Age, Fitness, Past/current experiences, Time since onset of injury/illness/exacerbation, and 1-2 comorbidities: HTN, Afib are also affecting patient's functional outcome.   REHAB POTENTIAL: Good  CLINICAL DECISION MAKING: Stable/uncomplicated  EVALUATION COMPLEXITY: Moderate  PLAN:  PT FREQUENCY: 2x/week  PT DURATION: 8 weeks  PLANNED INTERVENTIONS: 97164- PT Re-evaluation, 97750- Physical Performance Testing, 97110-Therapeutic exercises, 97530- Therapeutic activity, [id number]- Neuromuscular re-education, 97535- Self Care, [id number]- Manual therapy, [id number]- Gait training, [phone]- Orthotic Initial, [phone]- Orthotic/Prosthetic subsequent, [phone]- Aquatic Therapy, [phone]- Electrical stimulation (manual), Patient/Family education, Balance training, Stair training, Taping, Joint mobilization, Vestibular training, and DME instructions  PLAN FOR NEXT SESSION: Gait training w/ SPC.  Floor recovery - incorporate tight space setup - repeat w/ full transition to bottom and incorporate LLE use to recover to standing.  LLE NMR/strength/reaction time.  Expand strength and balance HEP.  Core strength.  Reaching floor/forward/overhead.  Desensitization strategies for LLE.  Ascent - ramp and stairs.  Perturbations/resisted gait/farmer's carry.  Closed chain ankle mobility/mobilization for  eversion/DF - shoe off.   Repeat LE stretch prn.  Olivia to join 11/10 session for further orthotic positioning assessment.  Pt declines offer of aquatic therapy at evaluation.  Daved KATHEE Bull, PT, DPT 03/21/2024, 4:17 PM

## 2024-03-21 NOTE — Patient Instructions (Signed)
 Access Code: [facility] URL: [email or web address] Date: 03/21/2024 Prepared by: Daved Bull  Exercises - Seated Toe Raise  - 1-2 x daily - 4 x weekly - 2 sets - 10 reps - 2-3 seconds hold - Ankle Inversion Eversion Towel Slide  - 1-2 x daily - 4 x weekly - 2 sets - 10 reps - Seated Ankle Circles  - 1-2 x daily - 4 x weekly - 2 sets - 10 reps - Seated Heel Raise  - 1-2 x daily - 4 x weekly - 2 sets - 10 reps - 2-3 seconds hold - Seated Ankle Inversion Eversion PROM  - 1 x daily - 7 x weekly - 2 sets - 10 reps - Staggered Sit-to-Stand  - 1 x daily - 4-5 x weekly - 2-3 sets - 10 reps - Seated Hamstring Stretch  - 1 x daily - 4-5 x weekly - 1 sets - 3-4 reps - 45 seconds hold - Hooklying Single Knee to Chest Stretch with Towel  - 1 x daily - 4-5 x weekly - 1 sets - 3-4 reps - 45 seconds hold

## 2024-03-24 ENCOUNTER — Encounter: Payer: Self-pay | Admitting: Physical Therapy

## 2024-03-24 ENCOUNTER — Ambulatory Visit: Admitting: Physical Therapy

## 2024-03-24 DIAGNOSIS — R29818 Other symptoms and signs involving the nervous system: Secondary | ICD-10-CM

## 2024-03-24 DIAGNOSIS — I69354 Hemiplegia and hemiparesis following cerebral infarction affecting left non-dominant side: Secondary | ICD-10-CM

## 2024-03-24 DIAGNOSIS — R278 Other lack of coordination: Secondary | ICD-10-CM | POA: Diagnosis not present

## 2024-03-24 DIAGNOSIS — R2681 Unsteadiness on feet: Secondary | ICD-10-CM | POA: Diagnosis not present

## 2024-03-24 DIAGNOSIS — R209 Unspecified disturbances of skin sensation: Secondary | ICD-10-CM | POA: Diagnosis not present

## 2024-03-24 DIAGNOSIS — R29898 Other symptoms and signs involving the musculoskeletal system: Secondary | ICD-10-CM | POA: Diagnosis not present

## 2024-03-24 NOTE — Therapy (Signed)
 OUTPATIENT PHYSICAL THERAPY NEURO TREATMENT   Patient Name: Donald [name] MRN: [id number] DOB:06-30-1945, 78 y.o., male Today's Date: 03/24/2024   PCP: Kip Righter, MD REFERRING PROVIDER: Sherryl Bouchard, NP  END OF SESSION:  PT End of Session - 03/24/24 1639     Visit Number 5    Number of Visits 17   16 + eval   Date for Recertification  05/13/24   pushed out due to scheduling delay   Authorization Type Medicare A&B w/ [employer] supplement    Progress Note Due on Visit 10    PT Start Time 1532    PT Stop Time 1620    PT Time Calculation (min) 48 min    Equipment Utilized During Treatment Gait belt    Activity Tolerance Patient tolerated treatment well    Behavior During Therapy WFL for tasks assessed/performed          Past Medical History:  Diagnosis Date   Diabetes mellitus without complication (HCC)    Dysrhythmia    Hyperlipemia    Hypertension    large right middle cerebral artery infarct, 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 s/p IV tPA, b. source unknown, c. loop recorder placed   Snoring 07/03/2015   Past Surgical History:  Procedure Laterality Date   LOOP RECORDER IMPLANT  06/07/2013   MDT LinQ implanted by Dr Kelsie for cryptogenic stroke   LOOP RECORDER IMPLANT N/A 06/07/2013   Procedure: LOOP RECORDER IMPLANT;  Surgeon: Lynwood JONETTA Kelsie, MD;  Location: [facility] CATH [facility];  Service: Cardiovascular;  Laterality: N/A;   TEE WITHOUT CARDIOVERSION N/A 06/07/2013   Procedure: TRANSESOPHAGEAL ECHOCARDIOGRAM (TEE);  Surgeon: Leim VEAR Moose, MD;  Location: [facility] ENDOSCOPY;  Service: Cardiovascular;  Laterality: N/A;   TONSILLECTOMY     TRANSANAL HEMORRHOIDAL DEARTERIALIZATION N/A 02/18/2023   Procedure: TRANSANAL HEMORRHOIDAL DEARTERIALIZATION;  Surgeon: Debby Hila, MD;  Location: [facility] ORS;  Service: General;  Laterality: N/A;   UMBILICAL HERNIA REPAIR     Patient Active Problem List   Diagnosis Date Noted   Permanent atrial fibrillation (HCC) 05/17/2019    Acquired thrombophilia 05/17/2019   Snoring 07/03/2015   Persistent atrial fibrillation (HCC) 04/16/2015   Hypokalemia 09/20/2014   HTN (hypertension) 09/19/2014   HLD (hyperlipidemia) 09/19/2014   Diabetes mellitus without complication (HCC) 09/19/2014   Jerking 09/19/2014   Alterations of sensations, late effect of cerebrovascular disease 11/07/2013   Disturbances of vision, late effect of cerebrovascular disease 11/07/2013   Sinus bradycardia 08/08/2013   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HCC) 07/26/2013   Left-sided neglect 07/26/2013   Small vessel disease 06/07/2013   Obesity, unspecified 06/07/2013   Cerebral infarction (HCC) 06/07/2013   large right middle cerebral artery infarct, 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   Hypertension 06/03/2013   Diabetes (HCC) 06/03/2013    ONSET DATE: 06/03/2013 (CVA)  REFERRING DIAG: P30.645 (ICD-10-CM) - Hemiparesis affecting left side as late effect of cerebrovascular accident (HCC)  THERAPY DIAG:  Hemiplegia and hemiparesis following cerebral infarction affecting left non-dominant side (HCC)  Other symptoms and signs involving the nervous system  Unsteadiness on feet  Rationale for Evaluation and Treatment: Rehabilitation  SUBJECTIVE:  SUBJECTIVE STATEMENT: Donald [name]  No new problems or issues; pt reports his left foot will roll if he is ambulating on uneven surface and is not focusing on his walking Pt accompanied by: self (Wife - waiting in car)  PERTINENT HISTORY: R MCA infarct 06/03/2013 S/p tPA, HTN, DM2, Afib, had 1 seizure in 2017  PAIN:  Are you having pain? No  PRECAUTIONS: Fall and Other: loop recorder  RED FLAGS: None   WEIGHT BEARING RESTRICTIONS: No  FALLS: Has patient fallen in last 6 months? Yes. Number of falls 3  LIVING  ENVIRONMENT: Lives with: lives with their spouse Lives in: House/apartment Stairs: No Has following equipment at home: Single point cane  PLOF: Requires assistive device for independence  PATIENT GOALS: To work on reaction time and getting off the floor  OBJECTIVE:  Note: Objective measures were completed at Evaluation unless otherwise noted.  DIAGNOSTIC FINDINGS:  Brain MRI 09/20/2014 IMPRESSION: 1. No acute infarct. 2. Chronic right MCA territory infarct. 3. Subtle T2 hyperintensity in the left hippocampus and left temporal lobe cortex versus artifact. Recent seizure activity is a consideration.  COGNITION: Overall cognitive status: History of cognitive impairments - at baseline - reports quicker to anger/verbalize emotions/sometimes overstimulated with sounds and distractions   SENSATION: Light touch: WFL - LLE hypersensitive/question of allodynia  COORDINATION: Mildly dysmetric LLE  EDEMA:  None significant in BLE  MUSCLE TONE: LLE: Modifed Ashworth Scale 1+ = Slight increase in muscle tone, manifested by a catch, followed by minimal resistance throughout the remainder (less than half) of the ROM  POSTURE: rounded shoulders, flexed trunk , and weight shift right - mildly flexed trunk and right weight shift in standing and with ambulation, L shoulder more rounded than R w/ mild shoulder hike  LOWER EXTREMITY ROM:     Active  Right Eval Left Eval  Hip flexion WNL WFL  Hip extension    Hip abduction    Hip adduction    Hip internal rotation    Hip external rotation    Knee flexion    Knee extension    Ankle dorsiflexion  2+  Ankle plantarflexion    Ankle inversion    Ankle eversion     (Blank rows = not tested)  LOWER EXTREMITY MMT:    MMT Right Eval Left Eval  Hip flexion    Hip extension    Hip abduction    Hip adduction    Hip internal rotation    Hip external rotation    Knee flexion    Knee extension    Ankle dorsiflexion    Ankle  plantarflexion    Ankle inversion    Ankle eversion    (Blank rows = not tested)  BED MOBILITY:  Findings: Sit to supine Complete Independence Supine to sit Complete Independence Rolling to Right Complete Independence Rolling to Left Complete Independence - needs increased time and momentum for L and R Ind w/ features of tempurpedic style bed  TRANSFERS: Sit to stand: SBA  Assistive device utilized: Single point cane     Stand to sit: SBA  Assistive device utilized: Single point cane     Chair to chair: SBA  Assistive device utilized: Single point cane       RAMP:  Not tested  CURB:  Not tested  STAIRS: Not tested GAIT: Findings: Distance walked: various clinic distances and Comments: Pt has mild right reliance w/ SPC, no foot scuff/toe catch even without AFO, shortened stride and slightly diminished flexion pattern of  LLE  FUNCTIONAL TESTS:  5 times sit to stand: 18.89 sec w/ heavy right trunk shortening and RUE reliance w/ L toe down vs full weight bearing  PATIENT SURVEYS:  ABC scale: The Activities-Specific Balance Confidence (ABC) Scale TBA                                                                                                                              TREATMENT DATE: 03-24-24  Self Care: Pt reported difficulty with walking due to Lt foot supination, especially on uneven surfaces/terrains and on gravel; pt reports he had an AFO but stopped wearing it many years ago. Pt demonstrates passive Lt foot eversion as instructed by primary PT, Daved Bull - pt stated this exercise was very helpful. Pt was shown air cast to assist in positioning Lt ankle in neutral position and to prevent supination - pt reports he lands on the lateral border of his Lt foot in stance. Pt initially declined trialing air cast stating he didn't think it would work for him, however, after continued recommendation to trial this orthosis. Pt agreed. Mod assist needed to don air cas as  tight fit in his shoe.  Gait: Pt gait trained inside // bars with use of mirror for visual feedback - approx. 10' x 4 reps; pt reported air cast was beneficial in decreasing Lt ankle supination and allowing more foot contact in stance rather than the lateral border of his foot  TherEx: Passive Lt eversion/inversion for stretching - 10 reps Pt performed Lt ankle eversion in seated position 10 reps Towel placed on floor - pt instructed to keep Lt heel on floor and move towel toward Lt side, facilitating Lt ankle eversion - pt stated both of these exercises were easy  TherAct: Sit to stand in staggered stance - Rt foot ahead to facilitate increased weight bearing on LLE - RUE support used  Pt performed floor to stand transfer - red mat placed on floor with sheet (pt stated sheet was in his way) - pt transferred from tall kneeling to Rt 1/2 kneeling on both reps  - pt used bil. UE support on high/low mat table to stand - CGA for safety on both reps   PATIENT EDUCATION: Education details: pt was shown air cast and trialed this orthosis - pt was informed that it could be ordered from [employer] but recommended pt hold until orthotic consult with [facility] on 04-11-24 Person educated: Patient Education method: Medical Illustrator Education comprehension: verbalized understanding and needs further education  HOME EXERCISE PROGRAM: Access Code: [facility] URL: [email or web address] Date: 03/21/2024 Prepared by: Daved Bull  Exercises - Seated Toe Raise  - 1-2 x daily - 4 x weekly - 2 sets - 10 reps - 2-3 seconds hold - Ankle Inversion Eversion Towel Slide  - 1-2 x daily - 4 x weekly - 2 sets - 10 reps - Seated Ankle Circles  - 1-2 x daily - 4 x weekly -  2 sets - 10 reps - Seated Heel Raise  - 1-2 x daily - 4 x weekly - 2 sets - 10 reps - 2-3 seconds hold - Seated Ankle Inversion Eversion PROM  - 1 x daily - 7 x weekly - 2 sets - 10 reps - Staggered Sit-to-Stand  - 1 x  daily - 4-5 x weekly - 2-3 sets - 10 reps - Seated Hamstring Stretch  - 1 x daily - 4-5 x weekly - 1 sets - 3-4 reps - 45 seconds hold - Hooklying Single Knee to Chest Stretch with Towel  - 1 x daily - 4-5 x weekly - 1 sets - 3-4 reps - 45 seconds hold  GOALS: Goals reviewed with patient? Yes  SHORT TERM GOALS: Target date: 04/08/2024  Pt will be independent and compliant with introductory strength and balance HEP in order to maintain functional progress and improve mobility. Baseline:  To be established. Goal status: INITIAL  2.  Pt will demonstrate adequate balance strategies to maintain upright with forward/overhead/and floor reaching. Baseline:  Very narrow limits of stability - several near falls related to this. Goal status: INITIAL  3.  Pt will be independent with desensitization strategies for LLE hypersensitivity management. Baseline: To be provided. Goal status: INITIAL  LONG TERM GOALS: Target date: 05/06/2024  Pt will be independent and compliant with advanced and finalized strength and balance HEP in order to maintain functional progress and improve mobility. Baseline: To be established. Goal status: INITIAL  2.  Patient will improve ABC Scale score to >/=76% in order to demonstrate decreased risk for falls and increased confidence in balance. Baseline: 66.875% (10/14) Goal status: INITIAL  3.  Patient will demonstrate floor recovery w/ no more than SBA in order to improve safety and home management. Baseline: Reports recent minA even w/ support surface Goal status: INITIAL  4.  Pt will decrease 5xSTS to </=12 seconds w/ improved midline mechanics in order to demonstrate decreased risk for falls and improved functional bilateral LE strength and power. Baseline: 18.89 sec w/ heavy right trunk shortening and RUE reliance w/ L toe down vs full weight bearing Goal status: INITIAL  ASSESSMENT:  CLINICAL IMPRESSION: PT session focused on trial of air cast for LLE to  promote neutral ankle positioning and to reduce supination due to increased tone in swing and stance phases of gait.  Pt has good AROM and strength of Lt ankle eversion, however, has increased tone in standing/gait resulting in Lt ankle supination, placing him at risk for ankle sprain with ambulation on uneven surfaces, gravel, etc.  Air cast was beneficial in reducing Lt ankle supination and allowing more foot flat contact in stance rather than lateral border of Lt foot. Pt has orthotic consult on 04-11-24 - will determine if another orthotic may be more beneficial than air cast and proceed with obtaining what is recommended during that consult.  Continue per POC.  OBJECTIVE IMPAIRMENTS: Abnormal gait, decreased activity tolerance, decreased balance, decreased cognition, decreased coordination, decreased endurance, decreased knowledge of use of DME, decreased strength, impaired sensation, impaired tone, improper body mechanics, and postural dysfunction.   ACTIVITY LIMITATIONS: carrying, lifting, bending, squatting, stairs, transfers, reach over head, and locomotion level  PARTICIPATION LIMITATIONS: laundry, shopping, and community activity  PERSONAL FACTORS: Age, Fitness, Past/current experiences, Time since onset of injury/illness/exacerbation, and 1-2 comorbidities: HTN, Afib are also affecting patient's functional outcome.   REHAB POTENTIAL: Good  CLINICAL DECISION MAKING: Stable/uncomplicated  EVALUATION COMPLEXITY: Moderate  PLAN:  PT FREQUENCY: 2x/week  PT DURATION: 8 weeks  PLANNED INTERVENTIONS: [id number]- PT Re-evaluation, 97750- Physical Performance Testing, 97110-Therapeutic exercises, 97530- Therapeutic activity, [id number]- Neuromuscular re-education, 97535- Self Care, [id number]- Manual therapy, [phone]- Gait training, [phone]- Orthotic Initial, [phone]- Orthotic/Prosthetic subsequent, [phone]- Aquatic Therapy, [phone]- Electrical stimulation (manual), Patient/Family education, Balance training, Stair  training, Taping, Joint mobilization, Vestibular training, and DME instructions  PLAN FOR NEXT SESSION: trial air cast again if pt agrees; Lt hip strengthening  Gait training w/ SPC.  Floor recovery - incorporate tight space setup - repeat w/ full transition to bottom and incorporate LLE use to recover to standing.  LLE NMR/strength/reaction time.  Expand strength and balance HEP.  Core strength.  Reaching floor/forward/overhead.  Desensitization strategies for LLE.  Ascent - ramp and stairs.  Perturbations/resisted gait/farmer's carry.  Closed chain ankle mobility/mobilization for eversion/DF - shoe off.   Repeat LE stretch prn.  Olivia to join 11/10 session for further orthotic positioning assessment.  Pt declines offer of aquatic therapy at evaluation.  Roxanna [place], PT 03/24/2024, 4:43 PM

## 2024-03-28 ENCOUNTER — Encounter: Payer: Self-pay | Admitting: Physical Therapy

## 2024-03-28 ENCOUNTER — Ambulatory Visit: Admitting: Physical Therapy

## 2024-03-28 DIAGNOSIS — R29818 Other symptoms and signs involving the nervous system: Secondary | ICD-10-CM

## 2024-03-28 DIAGNOSIS — R209 Unspecified disturbances of skin sensation: Secondary | ICD-10-CM

## 2024-03-28 DIAGNOSIS — R29898 Other symptoms and signs involving the musculoskeletal system: Secondary | ICD-10-CM

## 2024-03-28 DIAGNOSIS — I69354 Hemiplegia and hemiparesis following cerebral infarction affecting left non-dominant side: Secondary | ICD-10-CM

## 2024-03-28 DIAGNOSIS — R2681 Unsteadiness on feet: Secondary | ICD-10-CM

## 2024-03-28 DIAGNOSIS — R278 Other lack of coordination: Secondary | ICD-10-CM

## 2024-03-28 NOTE — Therapy (Signed)
 OUTPATIENT PHYSICAL THERAPY NEURO TREATMENT   Patient Name: Farris Geiman MRN: [id number] DOB:05/15/1946, 78 y.o., male Today's Date: 03/28/2024   PCP: Kip Righter, MD REFERRING PROVIDER: Sherryl Bouchard, NP  END OF SESSION:  PT End of Session - 03/28/24 1457     Visit Number 6    Number of Visits 17   16 + eval   Date for Recertification  05/13/24   pushed out due to scheduling delay   Authorization Type Medicare A&B w/ [employer] supplement    Progress Note Due on Visit 10    PT Start Time 1452    PT Stop Time 1540    PT Time Calculation (min) 48 min    Equipment Utilized During Treatment Gait belt    Activity Tolerance Patient tolerated treatment well    Behavior During Therapy WFL for tasks assessed/performed          Past Medical History:  Diagnosis Date   Diabetes mellitus without complication (HCC)    Dysrhythmia    Hyperlipemia    Hypertension    large right middle cerebral artery infarct, 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 s/p IV tPA, b. source unknown, c. loop recorder placed   Snoring 07/03/2015   Past Surgical History:  Procedure Laterality Date   LOOP RECORDER IMPLANT  06/07/2013   MDT LinQ implanted by Dr Kelsie for cryptogenic stroke   LOOP RECORDER IMPLANT N/A 06/07/2013   Procedure: LOOP RECORDER IMPLANT;  Surgeon: Lynwood JONETTA Kelsie, MD;  Location: [facility] CATH LAB;  Service: Cardiovascular;  Laterality: N/A;   TEE WITHOUT CARDIOVERSION N/A 06/07/2013   Procedure: TRANSESOPHAGEAL ECHOCARDIOGRAM (TEE);  Surgeon: Leim VEAR Moose, MD;  Location: [facility] ENDOSCOPY;  Service: Cardiovascular;  Laterality: N/A;   TONSILLECTOMY     TRANSANAL HEMORRHOIDAL DEARTERIALIZATION N/A 02/18/2023   Procedure: TRANSANAL HEMORRHOIDAL DEARTERIALIZATION;  Surgeon: Debby Hila, MD;  Location: [facility] ORS;  Service: General;  Laterality: N/A;   UMBILICAL HERNIA REPAIR     Patient Active Problem List   Diagnosis Date Noted   Permanent atrial fibrillation (HCC) 05/17/2019    Acquired thrombophilia 05/17/2019   Snoring 07/03/2015   Persistent atrial fibrillation (HCC) 04/16/2015   Hypokalemia 09/20/2014   HTN (hypertension) 09/19/2014   HLD (hyperlipidemia) 09/19/2014   Diabetes mellitus without complication (HCC) 09/19/2014   Jerking 09/19/2014   Alterations of sensations, late effect of cerebrovascular disease 11/07/2013   Disturbances of vision, late effect of cerebrovascular disease 11/07/2013   Sinus bradycardia 08/08/2013   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HCC) 07/26/2013   Left-sided neglect 07/26/2013   Small vessel disease 06/07/2013   Obesity, unspecified 06/07/2013   Cerebral infarction (HCC) 06/07/2013   large right middle cerebral artery infarct, 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   Hypertension 06/03/2013   Diabetes (HCC) 06/03/2013    ONSET DATE: 06/03/2013 (CVA)  REFERRING DIAG: P30.645 (ICD-10-CM) - Hemiparesis affecting left side as late effect of cerebrovascular accident (HCC)  THERAPY DIAG:  Hemiplegia and hemiparesis following cerebral infarction affecting left non-dominant side (HCC)  Other symptoms and signs involving the nervous system  Unsteadiness on feet  Other symptoms and signs involving the musculoskeletal system  Other lack of coordination  Unspecified disturbances of skin sensation  Rationale for Evaluation and Treatment: Rehabilitation  SUBJECTIVE:  SUBJECTIVE STATEMENT: [name]  Pt denies falls or acute status changes.  States the air cast helped but I am not sure it is the answer. Pt accompanied by: self (Wife - waiting in car)  PERTINENT HISTORY: R MCA infarct 06/03/2013 S/p tPA, HTN, DM2, Afib, had 1 seizure in 2017  PAIN:  Are you having pain? No  PRECAUTIONS: Fall and Other: loop recorder  RED FLAGS: None   WEIGHT  BEARING RESTRICTIONS: No  FALLS: Has patient fallen in last 6 months? Yes. Number of falls 3  LIVING ENVIRONMENT: Lives with: lives with their spouse Lives in: House/apartment Stairs: No Has following equipment at home: Single point cane  PLOF: Requires assistive device for independence  PATIENT GOALS: To work on reaction time and getting off the floor  OBJECTIVE:  Note: Objective measures were completed at Evaluation unless otherwise noted.  DIAGNOSTIC FINDINGS:  Brain MRI 09/20/2014 IMPRESSION: 1. No acute infarct. 2. Chronic right MCA territory infarct. 3. Subtle T2 hyperintensity in the left hippocampus and left temporal lobe cortex versus artifact. Recent seizure activity is a consideration.  COGNITION: Overall cognitive status: History of cognitive impairments - at baseline - reports quicker to anger/verbalize emotions/sometimes overstimulated with sounds and distractions   SENSATION: Light touch: WFL - LLE hypersensitive/question of allodynia  COORDINATION: Mildly dysmetric LLE  EDEMA:  None significant in BLE  MUSCLE TONE: LLE: Modifed Ashworth Scale 1+ = Slight increase in muscle tone, manifested by a catch, followed by minimal resistance throughout the remainder (less than half) of the ROM  POSTURE: rounded shoulders, flexed trunk , and weight shift right - mildly flexed trunk and right weight shift in standing and with ambulation, L shoulder more rounded than R w/ mild shoulder hike  LOWER EXTREMITY ROM:     Active  Right Eval Left Eval  Hip flexion WNL WFL  Hip extension    Hip abduction    Hip adduction    Hip internal rotation    Hip external rotation    Knee flexion    Knee extension    Ankle dorsiflexion  2+  Ankle plantarflexion    Ankle inversion    Ankle eversion     (Blank rows = not tested)  LOWER EXTREMITY MMT:    MMT Right Eval Left Eval  Hip flexion    Hip extension    Hip abduction    Hip adduction    Hip internal rotation     Hip external rotation    Knee flexion    Knee extension    Ankle dorsiflexion    Ankle plantarflexion    Ankle inversion    Ankle eversion    (Blank rows = not tested)  BED MOBILITY:  Findings: Sit to supine Complete Independence Supine to sit Complete Independence Rolling to Right Complete Independence Rolling to Left Complete Independence - needs increased time and momentum for L and R Ind w/ features of tempurpedic style bed  TRANSFERS: Sit to stand: SBA  Assistive device utilized: Single point cane     Stand to sit: SBA  Assistive device utilized: Single point cane     Chair to chair: SBA  Assistive device utilized: Single point cane       RAMP:  Not tested  CURB:  Not tested  STAIRS: Not tested GAIT: Findings: Distance walked: various clinic distances and Comments: Pt has mild right reliance w/ SPC, no foot scuff/toe catch even without AFO, shortened stride and slightly diminished flexion pattern of LLE  FUNCTIONAL TESTS:  5 times sit to stand: 18.89 sec w/ heavy right trunk shortening and RUE reliance w/ L toe down vs full weight bearing  PATIENT SURVEYS:  ABC scale: The Activities-Specific Balance Confidence (ABC) Scale TBA                                                                                                                              TREATMENT DATE: 03-28-24 -Donned aircast on L ankle > pt ambulates x20 ft over slight ramped surface stating orthosis placed too high so readjusted with better/lower fit and support (aircast donned for remainder of session):  -SL RLE taps to 4th step and down x20, intermittent breaks due to LUE hypersensitivity w/ prolonged grip  -L forward lunges using ballet bar x5 w/ tone pushing foot into inverted position so returned to stairs using second step for better flexed position of limb w/ forward weight shifting x20  -LLE on first step lateral weight shift x10 > RLE on step lateral weight shift x15 > L stance RLE taps to  second step x10  -L stance RLE step to color called working into large stride, intermittent CGA due to LOB mostly w/ crossover step  -L staggered STS x10  -2 R advance-retreat progressing from BUE support to light RUE support over 14 reps - pt endorses fear of falling  PATIENT EDUCATION: Education details: ongoing edu on aircast in clinic vs OOP purchase if deciding best fit following orthotist assessment 11/10. Person educated: Patient Education method: Medical Illustrator Education comprehension: verbalized understanding and needs further education  HOME EXERCISE PROGRAM: Access Code: [facility] URL: [email or web address] Date: 03/21/2024 Prepared by: Daved Bull  Exercises - Seated Toe Raise  - 1-2 x daily - 4 x weekly - 2 sets - 10 reps - 2-3 seconds hold - Ankle Inversion Eversion Towel Slide  - 1-2 x daily - 4 x weekly - 2 sets - 10 reps - Seated Ankle Circles  - 1-2 x daily - 4 x weekly - 2 sets - 10 reps - Seated Heel Raise  - 1-2 x daily - 4 x weekly - 2 sets - 10 reps - 2-3 seconds hold - Seated Ankle Inversion Eversion PROM  - 1 x daily - 7 x weekly - 2 sets - 10 reps - Staggered Sit-to-Stand  - 1 x daily - 4-5 x weekly - 2-3 sets - 10 reps - Seated Hamstring Stretch  - 1 x daily - 4-5 x weekly - 1 sets - 3-4 reps - 45 seconds hold - Hooklying Single Knee to Chest Stretch with Towel  - 1 x daily - 4-5 x weekly - 1 sets - 3-4 reps - 45 seconds hold  GOALS: Goals reviewed with patient? Yes  SHORT TERM GOALS: Target date: 04/08/2024  Pt will be independent and compliant with introductory strength and balance HEP in order to maintain functional progress and improve mobility. Baseline:  To be established. Goal status: INITIAL  2.  Pt will demonstrate adequate balance strategies to maintain upright with forward/overhead/and floor reaching. Baseline:  Very narrow limits of stability - several near falls related to this. Goal status: INITIAL  3.   Pt will be independent with desensitization strategies for LLE hypersensitivity management. Baseline: To be provided. Goal status: INITIAL  LONG TERM GOALS: Target date: 05/06/2024  Pt will be independent and compliant with advanced and finalized strength and balance HEP in order to maintain functional progress and improve mobility. Baseline: To be established. Goal status: INITIAL  2.  Patient will improve ABC Scale score to >/=76% in order to demonstrate decreased risk for falls and increased confidence in balance. Baseline: 66.875% (10/14) Goal status: INITIAL  3.  Patient will demonstrate floor recovery w/ no more than SBA in order to improve safety and home management. Baseline: Reports recent minA even w/ support surface Goal status: INITIAL  4.  Pt will decrease 5xSTS to </=12 seconds w/ improved midline mechanics in order to demonstrate decreased risk for falls and improved functional bilateral LE strength and power. Baseline: 18.89 sec w/ heavy right trunk shortening and RUE reliance w/ L toe down vs full weight bearing Goal status: INITIAL  ASSESSMENT:  CLINICAL IMPRESSION: Ongoing work with aircast focusing on closed chain LLE NMR to activate hip and align ankle w/ sagittal plane movements.  He demonstrates some left foot tone with lunges on flat ground and synergistic LUE fisting with effortful movements.  He may benefit from aircast for home and community use, but will await further orthotist assessment for final decision.  Continue per POC.  OBJECTIVE IMPAIRMENTS: Abnormal gait, decreased activity tolerance, decreased balance, decreased cognition, decreased coordination, decreased endurance, decreased knowledge of use of DME, decreased strength, impaired sensation, impaired tone, improper body mechanics, and postural dysfunction.   ACTIVITY LIMITATIONS: carrying, lifting, bending, squatting, stairs, transfers, reach over head, and locomotion level  PARTICIPATION  LIMITATIONS: laundry, shopping, and community activity  PERSONAL FACTORS: Age, Fitness, Past/current experiences, Time since onset of injury/illness/exacerbation, and 1-2 comorbidities: HTN, Afib are also affecting patient's functional outcome.   REHAB POTENTIAL: Good  CLINICAL DECISION MAKING: Stable/uncomplicated  EVALUATION COMPLEXITY: Moderate  PLAN:  PT FREQUENCY: 2x/week  PT DURATION: 8 weeks  PLANNED INTERVENTIONS: [id number]- PT Re-evaluation, 97750- Physical Performance Testing, 97110-Therapeutic exercises, 97530- Therapeutic activity, [id number]- Neuromuscular re-education, 97535- Self Care, [id number]- Manual therapy, [id number]- Gait training, [phone]- Orthotic Initial, [phone]- Orthotic/Prosthetic subsequent, [phone]- Aquatic Therapy, [phone]- Electrical stimulation (manual), Patient/Family education, Balance training, Stair training, Taping, Joint mobilization, Vestibular training, and DME instructions  PLAN FOR NEXT SESSION: trial air cast again if pt agrees - may benefit for use during SLS in clinic; Lt hip strengthening  Gait training w/ SPC.  Floor recovery - incorporate tight space setup - repeat w/ full transition to bottom and incorporate LLE use to recover to standing.  LLE NMR/strength/reaction time.  Expand strength and balance HEP.  Core strength.  Reaching floor/forward/overhead.  Desensitization strategies for LLE.  Ascent - ramp and stairs.  Perturbations/resisted gait/farmer's carry.  Closed chain ankle mobility/mobilization for eversion/DF - shoe off.   Repeat LE stretch prn.  Olivia to join 11/10 session for further orthotic positioning assessment.  Pt declines offer of aquatic therapy at evaluation.  Daved KATHEE Bull, PT, DPT 03/28/2024, 4:51 PM

## 2024-03-30 ENCOUNTER — Ambulatory Visit: Admitting: Physical Therapy

## 2024-03-30 ENCOUNTER — Encounter: Payer: Self-pay | Admitting: Physical Therapy

## 2024-03-30 DIAGNOSIS — R278 Other lack of coordination: Secondary | ICD-10-CM | POA: Diagnosis not present

## 2024-03-30 DIAGNOSIS — R2681 Unsteadiness on feet: Secondary | ICD-10-CM

## 2024-03-30 DIAGNOSIS — R29898 Other symptoms and signs involving the musculoskeletal system: Secondary | ICD-10-CM | POA: Diagnosis not present

## 2024-03-30 DIAGNOSIS — R209 Unspecified disturbances of skin sensation: Secondary | ICD-10-CM

## 2024-03-30 DIAGNOSIS — I69354 Hemiplegia and hemiparesis following cerebral infarction affecting left non-dominant side: Secondary | ICD-10-CM

## 2024-03-30 DIAGNOSIS — R29818 Other symptoms and signs involving the nervous system: Secondary | ICD-10-CM

## 2024-03-30 NOTE — Patient Instructions (Addendum)
 Desensitization Techniques: -Light touch/pressure:  using fingertip pressure to slowly move up small segments of the affected body part until outside the area of pain and then back to where you started -Deep pressure:  using fingertips to squeeze in small segments starting outside the affected area, crossing over the affected area, and then to the other side of the affected area -Tapping:  fingertips tap with firm pressure over the affected area, can move around the affected area as well -Brushing/scratching:  use fingertips to brush/scratch over and around the affected area -Textures:  use soft and rough textured items like cotton balls vs wash cloths to brush or rub with light into firm pressure over and around the affected area  Repeat these 3-4x per day.  - Seated Toe Towel Scrunches  - 1 x daily - 4 x weekly - 1-2 sets - 10 reps - Seated Arch Lifts  - 1 x daily - 4 x weekly - 1-2 sets - 10 reps

## 2024-03-30 NOTE — Therapy (Signed)
 OUTPATIENT PHYSICAL THERAPY NEURO TREATMENT   Patient Name: Donald [name] MRN: [id number] DOB:04-09-1946, 78 y.o., male Today's Date: 03/30/2024   PCP: Kip Righter, MD REFERRING PROVIDER: Sherryl Bouchard, NP  END OF SESSION:  PT End of Session - 03/30/24 1457     Visit Number 7    Number of Visits 17   16 + eval   Date for Recertification  05/13/24   pushed out due to scheduling delay   Authorization Type Medicare A&B w/ [employer] supplement    Progress Note Due on Visit 10    PT Start Time 1454   PT w/ pt prior   PT Stop Time 1540    PT Time Calculation (min) 46 min    Equipment Utilized During Treatment Gait belt    Activity Tolerance Patient tolerated treatment well    Behavior During Therapy WFL for tasks assessed/performed          Past Medical History:  Diagnosis Date   Diabetes mellitus without complication (HCC)    Dysrhythmia    Hyperlipemia    Hypertension    large right middle cerebral artery infarct, 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 s/p IV tPA, b. source unknown, c. loop recorder placed   Snoring 07/03/2015   Past Surgical History:  Procedure Laterality Date   LOOP RECORDER IMPLANT  06/07/2013   MDT LinQ implanted by Dr Kelsie for cryptogenic stroke   LOOP RECORDER IMPLANT N/A 06/07/2013   Procedure: LOOP RECORDER IMPLANT;  Surgeon: Lynwood JONETTA Kelsie, MD;  Location: [facility] CATH LAB;  Service: Cardiovascular;  Laterality: N/A;   TEE WITHOUT CARDIOVERSION N/A 06/07/2013   Procedure: TRANSESOPHAGEAL ECHOCARDIOGRAM (TEE);  Surgeon: Leim VEAR Moose, MD;  Location: [facility] ENDOSCOPY;  Service: Cardiovascular;  Laterality: N/A;   TONSILLECTOMY     TRANSANAL HEMORRHOIDAL DEARTERIALIZATION N/A 02/18/2023   Procedure: TRANSANAL HEMORRHOIDAL DEARTERIALIZATION;  Surgeon: Debby Hila, MD;  Location: [facility] ORS;  Service: General;  Laterality: N/A;   UMBILICAL HERNIA REPAIR     Patient Active Problem List   Diagnosis Date Noted   Permanent atrial fibrillation (HCC)  05/17/2019   Acquired thrombophilia 05/17/2019   Snoring 07/03/2015   Persistent atrial fibrillation (HCC) 04/16/2015   Hypokalemia 09/20/2014   HTN (hypertension) 09/19/2014   HLD (hyperlipidemia) 09/19/2014   Diabetes mellitus without complication (HCC) 09/19/2014   Jerking 09/19/2014   Alterations of sensations, late effect of cerebrovascular disease 11/07/2013   Disturbances of vision, late effect of cerebrovascular disease 11/07/2013   Sinus bradycardia 08/08/2013   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HCC) 07/26/2013   Left-sided neglect 07/26/2013   Small vessel disease 06/07/2013   Obesity, unspecified 06/07/2013   Cerebral infarction (HCC) 06/07/2013   large right middle cerebral artery infarct, 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   Hypertension 06/03/2013   Diabetes (HCC) 06/03/2013    ONSET DATE: 06/03/2013 (CVA)  REFERRING DIAG: P30.645 (ICD-10-CM) - Hemiparesis affecting left side as late effect of cerebrovascular accident (HCC)  THERAPY DIAG:  Other symptoms and signs involving the nervous system  Unsteadiness on feet  Other symptoms and signs involving the musculoskeletal system  Other lack of coordination  Hemiplegia and hemiparesis following cerebral infarction affecting left non-dominant side (HCC)  Unspecified disturbances of skin sensation  Rationale for Evaluation and Treatment: Rehabilitation  SUBJECTIVE:  SUBJECTIVE STATEMENT: [name]  Pt denies falls or acute status changes.  He made meatball soup yesterday Pt accompanied by: self (Wife - waiting in car)  PERTINENT HISTORY: R MCA infarct 06/03/2013 S/p tPA, HTN, DM2, Afib, had 1 seizure in 2017  PAIN:  Are you having pain? No  PRECAUTIONS: Fall and Other: loop recorder  RED FLAGS: None   WEIGHT BEARING  RESTRICTIONS: No  FALLS: Has patient fallen in last 6 months? Yes. Number of falls 3  LIVING ENVIRONMENT: Lives with: lives with their spouse Lives in: House/apartment Stairs: No Has following equipment at home: Single point cane  PLOF: Requires assistive device for independence  PATIENT GOALS: To work on reaction time and getting off the floor  OBJECTIVE:  Note: Objective measures were completed at Evaluation unless otherwise noted.  DIAGNOSTIC FINDINGS:  Brain MRI 09/20/2014 IMPRESSION: 1. No acute infarct. 2. Chronic right MCA territory infarct. 3. Subtle T2 hyperintensity in the left hippocampus and left temporal lobe cortex versus artifact. Recent seizure activity is a consideration.  COGNITION: Overall cognitive status: History of cognitive impairments - at baseline - reports quicker to anger/verbalize emotions/sometimes overstimulated with sounds and distractions   SENSATION: Light touch: WFL - LLE hypersensitive/question of allodynia  COORDINATION: Mildly dysmetric LLE  EDEMA:  None significant in BLE  MUSCLE TONE: LLE: Modifed Ashworth Scale 1+ = Slight increase in muscle tone, manifested by a catch, followed by minimal resistance throughout the remainder (less than half) of the ROM  POSTURE: rounded shoulders, flexed trunk , and weight shift right - mildly flexed trunk and right weight shift in standing and with ambulation, L shoulder more rounded than R w/ mild shoulder hike  LOWER EXTREMITY ROM:     Active  Right Eval Left Eval  Hip flexion WNL WFL  Hip extension    Hip abduction    Hip adduction    Hip internal rotation    Hip external rotation    Knee flexion    Knee extension    Ankle dorsiflexion  2+  Ankle plantarflexion    Ankle inversion    Ankle eversion     (Blank rows = not tested)  LOWER EXTREMITY MMT:    MMT Right Eval Left Eval  Hip flexion    Hip extension    Hip abduction    Hip adduction    Hip internal rotation    Hip  external rotation    Knee flexion    Knee extension    Ankle dorsiflexion    Ankle plantarflexion    Ankle inversion    Ankle eversion    (Blank rows = not tested)  BED MOBILITY:  Findings: Sit to supine Complete Independence Supine to sit Complete Independence Rolling to Right Complete Independence Rolling to Left Complete Independence - needs increased time and momentum for L and R Ind w/ features of tempurpedic style bed  TRANSFERS: Sit to stand: SBA  Assistive device utilized: Single point cane     Stand to sit: SBA  Assistive device utilized: Single point cane     Chair to chair: SBA  Assistive device utilized: Single point cane       RAMP:  Not tested  CURB:  Not tested  STAIRS: Not tested GAIT: Findings: Distance walked: various clinic distances and Comments: Pt has mild right reliance w/ SPC, no foot scuff/toe catch even without AFO, shortened stride and slightly diminished flexion pattern of LLE  FUNCTIONAL TESTS:  5 times sit to stand: 18.89 sec w/  heavy right trunk shortening and RUE reliance w/ L toe down vs full weight bearing  PATIENT SURVEYS:  ABC scale: The Activities-Specific Balance Confidence (ABC) Scale TBA                                                                                                                              TREATMENT DATE: 03-30-24 -SciFit x8 minutes in multi-peaks mode up to level 5.0 x8 minutes for HIIT style workout and closed chain reciprocal mobility providing left heel/ankle stability; cues to improve left pressure through left heel/foot posture -Closed chain left ankle mobility/mobilization for eversion/DF - shoe off using left grip pad and PT facilitating foot posture as pt does repetitive 3-way stepping to fatigue > repeated 3-way step w/ mobilization/PT providing neutral ankle stabilization w/ forward step onto // bar platform (~1.5) for increased SLS demand on LLE to fatigue -Had pt try toe scrunching on grip pad and  mid foot arch lifting - added to HEP -Advance retreat in L stance at ramp incline working up to 5th grip line over several reps adjusting left foot posture to improve stride mechanics > repeated at 6 inch step w/ SPC support SBA-CGA x5 then 7 reps in L stance w/ PT blocking excessive ankle inversion  PATIENT EDUCATION: Education details: Desensitization techniques for left hand/fingers and toes.  Working on left terminal stance w/ ambulation. Person educated: Patient Education method: Medical Illustrator Education comprehension: verbalized understanding and needs further education  HOME EXERCISE PROGRAM: Access Code: [facility] URL: [email or web address] Date: 03/21/2024 Prepared by: Daved Bull  Exercises - Seated Toe Raise  - 1-2 x daily - 4 x weekly - 2 sets - 10 reps - 2-3 seconds hold - Ankle Inversion Eversion Towel Slide  - 1-2 x daily - 4 x weekly - 2 sets - 10 reps - Seated Ankle Circles  - 1-2 x daily - 4 x weekly - 2 sets - 10 reps - Seated Heel Raise  - 1-2 x daily - 4 x weekly - 2 sets - 10 reps - 2-3 seconds hold - Seated Ankle Inversion Eversion PROM  - 1 x daily - 7 x weekly - 2 sets - 10 reps - Staggered Sit-to-Stand  - 1 x daily - 4-5 x weekly - 2-3 sets - 10 reps - Seated Hamstring Stretch  - 1 x daily - 4-5 x weekly - 1 sets - 3-4 reps - 45 seconds hold - Hooklying Single Knee to Chest Stretch with Towel  - 1 x daily - 4-5 x weekly - 1 sets - 3-4 reps - 45 seconds hold - Seated Toe Towel Scrunches  - 1 x daily - 4 x weekly - 1-2 sets - 10 reps - Seated Arch Lifts  - 1 x daily - 4 x weekly - 1-2 sets - 10 reps  Desensitization Techniques: -Light touch/pressure:  using fingertip pressure to slowly move up small segments of the affected body part until outside the  area of pain and then back to where you started -Deep pressure:  using fingertips to squeeze in small segments starting outside the affected area, crossing over the affected area,  and then to the other side of the affected area -Tapping:  fingertips tap with firm pressure over the affected area, can move around the affected area as well -Brushing/scratching:  use fingertips to brush/scratch over and around the affected area -Textures:  use soft and rough textured items like cotton balls vs wash cloths to brush or rub with light into firm pressure over and around the affected area  Repeat these 3-4x per day.   GOALS: Goals reviewed with patient? Yes  SHORT TERM GOALS: Target date: 04/08/2024  Pt will be independent and compliant with introductory strength and balance HEP in order to maintain functional progress and improve mobility. Baseline:  To be established. Goal status: INITIAL  2.  Pt will demonstrate adequate balance strategies to maintain upright with forward/overhead/and floor reaching. Baseline:  Very narrow limits of stability - several near falls related to this. Goal status: INITIAL  3.  Pt will be independent with desensitization strategies for LLE hypersensitivity management. Baseline: To be provided. Goal status: INITIAL  LONG TERM GOALS: Target date: 05/06/2024  Pt will be independent and compliant with advanced and finalized strength and balance HEP in order to maintain functional progress and improve mobility. Baseline: To be established. Goal status: INITIAL  2.  Patient will improve ABC Scale score to >/=76% in order to demonstrate decreased risk for falls and increased confidence in balance. Baseline: 66.875% (10/14) Goal status: INITIAL  3.  Patient will demonstrate floor recovery w/ no more than SBA in order to improve safety and home management. Baseline: Reports recent minA even w/ support surface Goal status: INITIAL  4.  Pt will decrease 5xSTS to </=12 seconds w/ improved midline mechanics in order to demonstrate decreased risk for falls and improved functional bilateral LE strength and power. Baseline: 18.89 sec w/ heavy right  trunk shortening and RUE reliance w/ L toe down vs full weight bearing Goal status: INITIAL  ASSESSMENT:  CLINICAL IMPRESSION: Focus of skilled session today on left ankle stabilization in closed chain w/ high reps to fatigue. He would benefit from additional work on knee flexion to improve fluidity of gait and L swing phase.  Side stepping may also provide an additional challenge to left ankle positioning and stability.  PT to continue working on best positioning of left ankle as this will likely improve stride mechanics and general balance strategies.  Continue per POC.  OBJECTIVE IMPAIRMENTS: Abnormal gait, decreased activity tolerance, decreased balance, decreased cognition, decreased coordination, decreased endurance, decreased knowledge of use of DME, decreased strength, impaired sensation, impaired tone, improper body mechanics, and postural dysfunction.   ACTIVITY LIMITATIONS: carrying, lifting, bending, squatting, stairs, transfers, reach over head, and locomotion level  PARTICIPATION LIMITATIONS: laundry, shopping, and community activity  PERSONAL FACTORS: Age, Fitness, Past/current experiences, Time since onset of injury/illness/exacerbation, and 1-2 comorbidities: HTN, Afib are also affecting patient's functional outcome.   REHAB POTENTIAL: Good  CLINICAL DECISION MAKING: Stable/uncomplicated  EVALUATION COMPLEXITY: Moderate  PLAN:  PT FREQUENCY: 2x/week  PT DURATION: 8 weeks  PLANNED INTERVENTIONS: [id number]- PT Re-evaluation, 97750- Physical Performance Testing, 97110-Therapeutic exercises, 97530- Therapeutic activity, [id number]- Neuromuscular re-education, 97535- Self Care, [id number]- Manual therapy, [id number]- Gait training, [phone]- Orthotic Initial, [phone]- Orthotic/Prosthetic subsequent, [phone]- Aquatic Therapy, [phone]- Electrical stimulation (manual), Patient/Family education, Balance training, Stair training, Taping, Joint mobilization, Vestibular training, and DME instructions  PLAN  FOR NEXT SESSION: trial air cast again if pt agrees - may benefit for use during SLS in clinic; Lt hip strengthening  Gait training w/ SPC.  Floor recovery - incorporate tight space setup - repeat w/ full transition to bottom and incorporate LLE use to recover to standing.  LLE NMR/strength/reaction time.  Expand strength and balance HEP.  Core strength.  Reaching floor/forward/overhead.  Ascent - ramp and stairs.  Perturbations/resisted gait/farmer's carry.  Lateral stepping and left knee flexion w/ swing through - bean bag slide, toe scoop tasks  Repeat LE stretch prn.  Olivia to join 11/10 session for further orthotic positioning assessment.  Pt declines offer of aquatic therapy at evaluation.  Daved KATHEE Bull, PT, DPT 03/30/2024, 4:28 PM

## 2024-04-04 ENCOUNTER — Encounter: Payer: Self-pay | Admitting: Physical Therapy

## 2024-04-04 ENCOUNTER — Ambulatory Visit: Attending: Adult Health | Admitting: Physical Therapy

## 2024-04-04 ENCOUNTER — Ambulatory Visit

## 2024-04-04 DIAGNOSIS — M6281 Muscle weakness (generalized): Secondary | ICD-10-CM | POA: Insufficient documentation

## 2024-04-04 DIAGNOSIS — R29818 Other symptoms and signs involving the nervous system: Secondary | ICD-10-CM | POA: Insufficient documentation

## 2024-04-04 DIAGNOSIS — R29898 Other symptoms and signs involving the musculoskeletal system: Secondary | ICD-10-CM | POA: Diagnosis not present

## 2024-04-04 DIAGNOSIS — R208 Other disturbances of skin sensation: Secondary | ICD-10-CM | POA: Diagnosis not present

## 2024-04-04 DIAGNOSIS — I69354 Hemiplegia and hemiparesis following cerebral infarction affecting left non-dominant side: Secondary | ICD-10-CM | POA: Diagnosis not present

## 2024-04-04 DIAGNOSIS — R278 Other lack of coordination: Secondary | ICD-10-CM | POA: Insufficient documentation

## 2024-04-04 DIAGNOSIS — R41844 Frontal lobe and executive function deficit: Secondary | ICD-10-CM | POA: Diagnosis not present

## 2024-04-04 DIAGNOSIS — R209 Unspecified disturbances of skin sensation: Secondary | ICD-10-CM | POA: Diagnosis not present

## 2024-04-04 DIAGNOSIS — R2681 Unsteadiness on feet: Secondary | ICD-10-CM | POA: Insufficient documentation

## 2024-04-04 NOTE — Therapy (Signed)
 OUTPATIENT OCCUPATIONAL THERAPY NEURO EVALUATION  Patient Name: Donald [name] MRN: [id number] DOB:17-Jun-1945, 78 y.o., male Today's Date: 04/04/2024  PCP: Kip Righter, MD REFERRING PROVIDER: Sherryl Bouchard, NP  END OF SESSION:  OT End of Session - 04/04/24 1646     Visit Number 1    Number of Visits 17   including eval   Date for Recertification  06/04/24    Authorization Type MCR A&B/[employer]    Progress Note Due on Visit 10    OT Start Time 1400    OT Stop Time 1449    OT Time Calculation (min) 49 min    Equipment Utilized During Treatment Testing materials, red theraputty    Activity Tolerance Patient tolerated treatment well    Behavior During Therapy WFL for tasks assessed/performed          Past Medical History:  Diagnosis Date   Diabetes mellitus without complication (HCC)    Dysrhythmia    Hyperlipemia    Hypertension    large right middle cerebral artery infarct, 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 s/p IV tPA, b. source unknown, c. loop recorder placed   Snoring 07/03/2015   Past Surgical History:  Procedure Laterality Date   LOOP RECORDER IMPLANT  06/07/2013   MDT LinQ implanted by Dr Kelsie for cryptogenic stroke   LOOP RECORDER IMPLANT N/A 06/07/2013   Procedure: LOOP RECORDER IMPLANT;  Surgeon: Lynwood JONETTA Kelsie, MD;  Location: [facility] CATH [facility];  Service: Cardiovascular;  Laterality: N/A;   TEE WITHOUT CARDIOVERSION N/A 06/07/2013   Procedure: TRANSESOPHAGEAL ECHOCARDIOGRAM (TEE);  Surgeon: Leim VEAR Moose, MD;  Location: [facility] ENDOSCOPY;  Service: Cardiovascular;  Laterality: N/A;   TONSILLECTOMY     TRANSANAL HEMORRHOIDAL DEARTERIALIZATION N/A 02/18/2023   Procedure: TRANSANAL HEMORRHOIDAL DEARTERIALIZATION;  Surgeon: Debby Hila, MD;  Location: [facility] ORS;  Service: General;  Laterality: N/A;   UMBILICAL HERNIA REPAIR     Patient Active Problem List   Diagnosis Date Noted   Permanent atrial fibrillation (HCC) 05/17/2019   Acquired thrombophilia 05/17/2019   Snoring  07/03/2015   Persistent atrial fibrillation (HCC) 04/16/2015   Hypokalemia 09/20/2014   HTN (hypertension) 09/19/2014   HLD (hyperlipidemia) 09/19/2014   Diabetes mellitus without complication (HCC) 09/19/2014   Jerking 09/19/2014   Alterations of sensations, late effect of cerebrovascular disease 11/07/2013   Disturbances of vision, late effect of cerebrovascular disease 11/07/2013   Sinus bradycardia 08/08/2013   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HCC) 07/26/2013   Left-sided neglect 07/26/2013   Small vessel disease 06/07/2013   Obesity, unspecified 06/07/2013   Cerebral infarction (HCC) 06/07/2013   large right middle cerebral artery infarct, 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   Hypertension 06/03/2013   Diabetes (HCC) 06/03/2013    ONSET DATE: 06/03/2013 (CVA), 03/15/2024 referral date  REFERRING DIAG: P30.645 (ICD-10-CM) - Hemiplegia 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 No acute infarct. 2. Chronic right MCA territory infarct. 3. Subtle T2 hyperintensity in the left hippocampus and left temporal lobe cortex versus  artifact. Recent seizure activity is a consideration  PRECAUTIONS: Fall and Other: loop recorder, L sided weakness  WEIGHT BEARING RESTRICTIONS: No  PAIN:  Are you having pain? No  FALLS: Has patient fallen in last 6 months? Yes.  Number of falls 4 in the last year  LIVING ENVIRONMENT: Lives with: lives with their spouse Lives in: House/apartment 1 level Stairs: No Has following equipment at home: Single point cane, shower chair/bench with back, walk in shower  PLOF: Requires assistive device for independence  PATIENT GOALS: I'd like to be able to use my left hand more functionally. I want to improve my coordination and reaction time.  OBJECTIVE:  Note: Objective measures were completed at Evaluation unless otherwise noted.  HAND DOMINANCE: Right  ADLs: Overall ADLs: Independent  Equipment: Shower seat with back and Walk in shower  IADLs: Cooks and cleans bathrooms and kitchen Shopping:  Light housekeeping: Independent Meal Prep: Independent Community mobility: Wife completes  Medication management: Independent Landscape architect: Independent Handwriting: did not assess d/t nondominant L hand being affected  MOBILITY STATUS: ambulates with SPC  POSTURE COMMENTS:  rounded shoulders, flexed trunk , and weight shift right - mildly flexed trunk and right weight shift in standing and with ambulation, L shoulder more rounded than R w/ mild shoulder hike   ACTIVITY TOLERANCE: Activity tolerance: no changes  FUNCTIONAL OUTCOME MEASURES: PSFS:   UPPER EXTREMITY ROM:  WNL  Active ROM Right eval Left eval  Shoulder flexion    Shoulder abduction    Shoulder adduction    Shoulder extension    Shoulder internal rotation    Shoulder external rotation    Elbow flexion    Elbow extension    Wrist flexion    Wrist extension    Wrist ulnar deviation    Wrist radial deviation    Wrist pronation    Wrist supination    (Blank rows = not tested)  UPPER EXTREMITY MMT:   4-/5 shoulder   MMT Right eval Left eval  Shoulder flexion    Shoulder abduction    Shoulder adduction    Shoulder extension    Shoulder internal rotation    Shoulder external rotation    Middle trapezius    Lower  trapezius    Elbow flexion    Elbow extension    Wrist flexion    Wrist extension    Wrist ulnar deviation    Wrist radial deviation    Wrist pronation    Wrist supination    (Blank rows = not tested)  HAND FUNCTION: Grip strength: Right: 73.1 lbs; Left: 58.2 lbs  COORDINATION: 9 Hole Peg test: Right: 26.32 sec; Left: 65.08 sec Box and Blocks:  Right 49 blocks, Left 29 blocks  SENSATION: Reports hypersensitivity in fingertips  EDEMA: Pt reports hx of edema but it is rare now.  MUSCLE TONE: LUE: Modifed Ashworth Scale 1 = Slight increase in muscle tone, manifested by a catch and release or by minimal resistance at the end of the range of motion when the affected part(s) is moved in flexion or extension  COGNITION: Overall cognitive status: Within functional limits for tasks assessed  VISION: Subjective report: Pt reports having small cataracts but it is not affecting vision at this time Baseline vision: Wears glasses all the time Visual history: cataracts well kept  VISION ASSESSMENT: To be further assessed in functional context    PERCEPTION: WFL  PRAXIS: WFL  OBSERVATIONS: Impaired FM coordination, grip strength, strength in L hand and shoulder,  hypersensitivity in fingertips of L hand                                                                                                                             TREATMENT DATE: 04/04/24  Pt educated in purpose of OT, POC, and collaborated with pt on goals. Pt in agreement with plan. Pt also educated in desensitization techniques to reduce hypersensitivity in fingertips of L hand to carry over with ability to play guitar.  Assessed pt ability to grip red theraputty, pt reported no complications. Pt instructed to squeeze and educated in FM coordination activity of removing small items from putty.      PATIENT EDUCATION: Education details: Purpose of OT, goals, POC, desensitization techniques Person educated:  Patient Education method: Explanation, Demonstration, and Handouts Education comprehension: verbalized understanding and needs further education  HOME EXERCISE PROGRAM: 04/04/24: Desensitization   GOALS: Goals reviewed with patient? Yes  SHORT TERM GOALS: Target date: 05/04/24  Pt will be independent with HEP completion for coordination and strengthening Baseline: New to OP OT Goal status: INITIAL  2.  Pt will be independent with desensitization techniques Baseline: Educated  Goal status: IN PROGRESS  3.  Pt will increase L shoulder strength to 4/5 Baseline: 4-/5  Goal status: INITIAL  4.  Pt will demonstrate improved coordination in L hand by a score of 34 blocks in Box and Blocks Test Baseline: Right 49 blocks, Left 29 blocks Goal status: INITIAL  LONG TERM GOALS: Target date: 06/04/24  Pt will demonstrate an increase of at least 2.0 score of average PSFS score or a 2 point increase in individual activity score in PSFS Baseline:  Goal status: INITIAL  2.  Pt will demonstrate improved L hand FM coordination by a score of 60 seconds or less in 9HPT Baseline: Right: 26.32 sec; Left: 65.08 sec Goal status: INITIAL  3.  Pt will demonstrate improved grip strength in L hand by scoring at least 65 pounds Baseline: Right: 73.1 lbs; Left: 58.2 lbs Goal status: INITIAL  4.  Pt will demonstrate improved L coordination by a score of at least 39 blocks on Box and Blocks test Baseline: 29 blocks Goal status: INITIAL   ASSESSMENT:  CLINICAL IMPRESSION: Patient is a 78 y.o. male who was seen today for occupational therapy evaluation for old CVA with L sided weakness and impaired coordination and hypersensitivity. Hx includes HTN, DM2, Afib, had 1 seizure in 2017. Patient currently presents slightly below baseline level of functioning demonstrating functional deficits and impairments as noted below. Pt would benefit from skilled OT services in the outpatient setting to work on  impairments as noted below to help pt return to PLOF as able.     PERFORMANCE DEFICITS: in functional skills including IADLs, coordination, dexterity, sensation, strength, Fine motor control, Gross motor control, decreased knowledge of precautions, and UE functional use, cognitive skills including safety awareness, and psychosocial skills including coping strategies and environmental adaptation.   IMPAIRMENTS:  are limiting patient from IADLs, work, and leisure.   CO-MORBIDITIES: may have co-morbidities  that affects occupational performance. Patient will benefit from skilled OT to address above impairments and improve overall function.  MODIFICATION OR ASSISTANCE TO COMPLETE EVALUATION: Min-Moderate modification of tasks or assist with assess necessary to complete an evaluation.  OT OCCUPATIONAL PROFILE AND HISTORY: Detailed assessment: Review of records and additional review of physical, cognitive, psychosocial history related to current functional performance.  CLINICAL DECISION MAKING: Moderate - several treatment options, min-mod task modification necessary  REHAB POTENTIAL: Good  EVALUATION COMPLEXITY: Moderate    PLAN:  OT FREQUENCY: 2x/week  OT DURATION: 8 weeks  PLANNED INTERVENTIONS: 97168 OT Re-evaluation, 97535 self care/ADL training, [id number] therapeutic exercise, 97530 therapeutic activity, 97112 neuromuscular re-education, passive range of motion, psychosocial skills training, coping strategies training, patient/family education, and DME and/or AE instructions  RECOMMENDED OTHER SERVICES: none at this time  CONSULTED AND AGREED WITH PLAN OF CARE: Patient  PLAN FOR NEXT SESSION: Coordination HEP Add on Putty HEP F/u desensitization   Rocky Dutch, OT 04/04/2024, 4:48 PM

## 2024-04-04 NOTE — Therapy (Signed)
 OUTPATIENT PHYSICAL THERAPY NEURO TREATMENT   Patient Name: Donald [name] MRN: [id number] DOB:1945-06-18, 78 y.o., male Today's Date: 04/04/2024   PCP: Kip Righter, MD REFERRING PROVIDER: Sherryl Bouchard, NP  END OF SESSION:  PT End of Session - 04/04/24 1452     Visit Number 8    Number of Visits 17   16 + eval   Date for Recertification  05/13/24   pushed out due to scheduling delay   Authorization Type Medicare A&B w/ [employer] supplement    Progress Note Due on Visit 10    PT Start Time 1451    PT Stop Time 1535    PT Time Calculation (min) 44 min    Equipment Utilized During Treatment Gait belt    Activity Tolerance Patient tolerated treatment well    Behavior During Therapy WFL for tasks assessed/performed          Past Medical History:  Diagnosis Date   Diabetes mellitus without complication (HCC)    Dysrhythmia    Hyperlipemia    Hypertension    large right middle cerebral artery infarct, 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 s/p IV tPA, b. source unknown, c. loop recorder placed   Snoring 07/03/2015   Past Surgical History:  Procedure Laterality Date   LOOP RECORDER IMPLANT  06/07/2013   MDT LinQ implanted by Dr Kelsie for cryptogenic stroke   LOOP RECORDER IMPLANT N/A 06/07/2013   Procedure: LOOP RECORDER IMPLANT;  Surgeon: Lynwood JONETTA Kelsie, MD;  Location: [facility] CATH LAB;  Service: Cardiovascular;  Laterality: N/A;   TEE WITHOUT CARDIOVERSION N/A 06/07/2013   Procedure: TRANSESOPHAGEAL ECHOCARDIOGRAM (TEE);  Surgeon: Leim VEAR Moose, MD;  Location: [facility] ENDOSCOPY;  Service: Cardiovascular;  Laterality: N/A;   TONSILLECTOMY     TRANSANAL HEMORRHOIDAL DEARTERIALIZATION N/A 02/18/2023   Procedure: TRANSANAL HEMORRHOIDAL DEARTERIALIZATION;  Surgeon: Debby Hila, MD;  Location: [facility] ORS;  Service: General;  Laterality: N/A;   UMBILICAL HERNIA REPAIR     Patient Active Problem List   Diagnosis Date Noted   Permanent atrial fibrillation (HCC) 05/17/2019   Acquired  thrombophilia 05/17/2019   Snoring 07/03/2015   Persistent atrial fibrillation (HCC) 04/16/2015   Hypokalemia 09/20/2014   HTN (hypertension) 09/19/2014   HLD (hyperlipidemia) 09/19/2014   Diabetes mellitus without complication (HCC) 09/19/2014   Jerking 09/19/2014   Alterations of sensations, late effect of cerebrovascular disease 11/07/2013   Disturbances of vision, late effect of cerebrovascular disease 11/07/2013   Sinus bradycardia 08/08/2013   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HCC) 07/26/2013   Left-sided neglect 07/26/2013   Small vessel disease 06/07/2013   Obesity, unspecified 06/07/2013   Cerebral infarction (HCC) 06/07/2013   large right middle cerebral artery infarct, 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   Hypertension 06/03/2013   Diabetes (HCC) 06/03/2013    ONSET DATE: 06/03/2013 (CVA)  REFERRING DIAG: P30.645 (ICD-10-CM) - Hemiparesis affecting left side as late effect of cerebrovascular accident (HCC)  THERAPY DIAG:  Other symptoms and signs involving the nervous system  Unsteadiness on feet  Other symptoms and signs involving the musculoskeletal system  Other lack of coordination  Hemiplegia and hemiparesis following cerebral infarction affecting left non-dominant side (HCC)  Unspecified disturbances of skin sensation  Rationale for Evaluation and Treatment: Rehabilitation  SUBJECTIVE:  SUBJECTIVE STATEMENT: Donald [name]  Pt denies falls or acute status changes.  He has been thinking about time spent figuring out what he needs for his ankle.  He feels we are taking a shotgun approach and need to focus in on a core 5-6 exercises that will make him feel stronger.  He felt a benefit from ramp and stairs and would like to angle in on things like these. Pt accompanied by: self (Wife -  waiting in car)  PERTINENT HISTORY: R MCA infarct 06/03/2013 S/p tPA, HTN, DM2, Afib, had 1 seizure in 2017  PAIN:  Are you having pain? No  PRECAUTIONS: Fall and Other: loop recorder  RED FLAGS: None   WEIGHT BEARING RESTRICTIONS: No  FALLS: Has patient fallen in last 6 months? Yes. Number of falls 3  LIVING ENVIRONMENT: Lives with: lives with their spouse Lives in: House/apartment Stairs: No Has following equipment at home: Single point cane  PLOF: Requires assistive device for independence  PATIENT GOALS: To work on reaction time and getting off the floor  OBJECTIVE:  Note: Objective measures were completed at Evaluation unless otherwise noted.  DIAGNOSTIC FINDINGS:  Brain MRI 09/20/2014 IMPRESSION: 1. No acute infarct. 2. Chronic right MCA territory infarct. 3. Subtle T2 hyperintensity in the left hippocampus and left temporal lobe cortex versus artifact. Recent seizure activity is a consideration.  COGNITION: Overall cognitive status: History of cognitive impairments - at baseline - reports quicker to anger/verbalize emotions/sometimes overstimulated with sounds and distractions   SENSATION: Light touch: WFL - LLE hypersensitive/question of allodynia  COORDINATION: Mildly dysmetric LLE  EDEMA:  None significant in BLE  MUSCLE TONE: LLE: Modifed Ashworth Scale 1+ = Slight increase in muscle tone, manifested by a catch, followed by minimal resistance throughout the remainder (less than half) of the ROM  POSTURE: rounded shoulders, flexed trunk , and weight shift right - mildly flexed trunk and right weight shift in standing and with ambulation, L shoulder more rounded than R w/ mild shoulder hike  LOWER EXTREMITY ROM:     Active  Right Eval Left Eval  Hip flexion WNL WFL  Hip extension    Hip abduction    Hip adduction    Hip internal rotation    Hip external rotation    Knee flexion    Knee extension    Ankle dorsiflexion  2+  Ankle plantarflexion     Ankle inversion    Ankle eversion     (Blank rows = not tested)  LOWER EXTREMITY MMT:    MMT Right Eval Left Eval  Hip flexion    Hip extension    Hip abduction    Hip adduction    Hip internal rotation    Hip external rotation    Knee flexion    Knee extension    Ankle dorsiflexion    Ankle plantarflexion    Ankle inversion    Ankle eversion    (Blank rows = not tested)  BED MOBILITY:  Findings: Sit to supine Complete Independence Supine to sit Complete Independence Rolling to Right Complete Independence Rolling to Left Complete Independence - needs increased time and momentum for L and R Ind w/ features of tempurpedic style bed  TRANSFERS: Sit to stand: SBA  Assistive device utilized: Single point cane     Stand to sit: SBA  Assistive device utilized: Single point cane     Chair to chair: SBA  Assistive device utilized: Single point cane       RAMP:  Not  tested  CURB:  Not tested  STAIRS: Not tested GAIT: Findings: Distance walked: various clinic distances and Comments: Pt has mild right reliance w/ SPC, no foot scuff/toe catch even without AFO, shortened stride and slightly diminished flexion pattern of LLE  FUNCTIONAL TESTS:  5 times sit to stand: 18.89 sec w/ heavy right trunk shortening and RUE reliance w/ L toe down vs full weight bearing  PATIENT SURVEYS:  ABC scale: The Activities-Specific Balance Confidence (ABC) Scale TBA                                                                                                                              TREATMENT DATE: 04-04-24 -6 step taps x20 BUE support > LLE step ups x10 > x4 > x2 (attempted to return to stairs after ramp, but LUE too sensitive to continue w/ BUE tasks) -Platform decline RLE step outs 2 rounds of ~20 reps (second round corrected L heel alignment for better toe off position) using SPC SBA - cued to decrease UE reliance on SPC -Standing SPC support SBA for 32 bean bag L DF kicks to 6  targets for multi-directional ankle mobility and SLS -Attempted DF beanbag kick x~20 ft, but tone too intense for pt to break -L DF bean bag flick off 8 cone x6, compensatory hip flexion/pelvic rotation due to ankle weakness/tone -Sitting L DF w/ 5lb kettlebell x5 (too easy per pt) > x10 lb kettlebell DF w/ more isometric style contraction, but pt preferred this intensity despite recommending middle ground w/ ankle wt.  Good activation noted to fatigue over ~2x20 reps.  PATIENT EDUCATION: Education details:  Working on left terminal stance w/ ambulation.  Work on step through pattern on ramp w/ rail at home. Person educated: Patient Education method: Medical Illustrator Education comprehension: verbalized understanding and needs further education  HOME EXERCISE PROGRAM: Access Code: [facility] URL: [email or web address] Date: 03/21/2024 Prepared by: Daved Bull  Exercises - Seated Toe Raise  - 1-2 x daily - 4 x weekly - 2 sets - 10 reps - 2-3 seconds hold - Ankle Inversion Eversion Towel Slide  - 1-2 x daily - 4 x weekly - 2 sets - 10 reps - Seated Ankle Circles  - 1-2 x daily - 4 x weekly - 2 sets - 10 reps - Seated Heel Raise  - 1-2 x daily - 4 x weekly - 2 sets - 10 reps - 2-3 seconds hold - Seated Ankle Inversion Eversion PROM  - 1 x daily - 7 x weekly - 2 sets - 10 reps - Staggered Sit-to-Stand  - 1 x daily - 4-5 x weekly - 2-3 sets - 10 reps - Seated Hamstring Stretch  - 1 x daily - 4-5 x weekly - 1 sets - 3-4 reps - 45 seconds hold - Hooklying Single Knee to Chest Stretch with Towel  - 1 x daily - 4-5 x weekly - 1 sets - 3-4 reps - 45 seconds  hold - Seated Toe Towel Scrunches  - 1 x daily - 4 x weekly - 1-2 sets - 10 reps - Seated Arch Lifts  - 1 x daily - 4 x weekly - 1-2 sets - 10 reps  Desensitization Techniques: -Light touch/pressure:  using fingertip pressure to slowly move up small segments of the affected body part until outside the area of  pain and then back to where you started -Deep pressure:  using fingertips to squeeze in small segments starting outside the affected area, crossing over the affected area, and then to the other side of the affected area -Tapping:  fingertips tap with firm pressure over the affected area, can move around the affected area as well -Brushing/scratching:  use fingertips to brush/scratch over and around the affected area -Textures:  use soft and rough textured items like cotton balls vs wash cloths to brush or rub with light into firm pressure over and around the affected area  Repeat these 3-4x per day.   GOALS: Goals reviewed with patient? Yes  SHORT TERM GOALS: Target date: 04/08/2024  Pt will be independent and compliant with introductory strength and balance HEP in order to maintain functional progress and improve mobility. Baseline:  To be established. Goal status: INITIAL  2.  Pt will demonstrate adequate balance strategies to maintain upright with forward/overhead/and floor reaching. Baseline:  Very narrow limits of stability - several near falls related to this. Goal status: INITIAL  3.  Pt will be independent with desensitization strategies for LLE hypersensitivity management. Baseline: To be provided. Goal status: INITIAL  LONG TERM GOALS: Target date: 05/06/2024  Pt will be independent and compliant with advanced and finalized strength and balance HEP in order to maintain functional progress and improve mobility. Baseline: To be established. Goal status: INITIAL  2.  Patient will improve ABC Scale score to >/=76% in order to demonstrate decreased risk for falls and increased confidence in balance. Baseline: 66.875% (10/14) Goal status: INITIAL  3.  Patient will demonstrate floor recovery w/ no more than SBA in order to improve safety and home management. Baseline: Reports recent minA even w/ support surface Goal status: INITIAL  4.  Pt will decrease 5xSTS to </=12 seconds  w/ improved midline mechanics in order to demonstrate decreased risk for falls and improved functional bilateral LE strength and power. Baseline: 18.89 sec w/ heavy right trunk shortening and RUE reliance w/ L toe down vs full weight bearing Goal status: INITIAL  ASSESSMENT:  CLINICAL IMPRESSION: Pt seen for skilled PT session today to address ongoing ankle weakness and improve gait mechanics.  He requests to focus sessions on more targeted approach with more emphasis on repetition so he can feel more strength benefit.  PT will continue to address ankle positioning, core engagement, and dynamic stability in more block style to accommodate request as pt would benefit from this as he has made progress with prior variable approach as well.  Continue per POC.  OBJECTIVE IMPAIRMENTS: Abnormal gait, decreased activity tolerance, decreased balance, decreased cognition, decreased coordination, decreased endurance, decreased knowledge of use of DME, decreased strength, impaired sensation, impaired tone, improper body mechanics, and postural dysfunction.   ACTIVITY LIMITATIONS: carrying, lifting, bending, squatting, stairs, transfers, reach over head, and locomotion level  PARTICIPATION LIMITATIONS: laundry, shopping, and community activity  PERSONAL FACTORS: Age, Fitness, Past/current experiences, Time since onset of injury/illness/exacerbation, and 1-2 comorbidities: HTN, Afib are also affecting patient's functional outcome.   REHAB POTENTIAL: Good  CLINICAL DECISION MAKING: Stable/uncomplicated  EVALUATION COMPLEXITY:  Moderate  PLAN:  PT FREQUENCY: 2x/week  PT DURATION: 8 weeks  PLANNED INTERVENTIONS: [id number]- PT Re-evaluation, 97750- Physical Performance Testing, 97110-Therapeutic exercises, 97530- Therapeutic activity, [id number]- Neuromuscular re-education, 97535- Self Care, [id number]- Manual therapy, [id number]- Gait training, [phone]- Orthotic Initial, [phone]- Orthotic/Prosthetic subsequent, [phone]- Aquatic  Therapy, [phone]- Electrical stimulation (manual), Patient/Family education, Balance training, Stair training, Taping, Joint mobilization, Vestibular training, and DME instructions  PLAN FOR NEXT SESSION: trial air cast again if pt agrees - may benefit for use during SLS in clinic; Lt hip strengthening  Gait training w/ SPC.  Floor recovery - incorporate tight space setup - repeat w/ full transition to bottom and incorporate LLE use to recover to standing.  LLE NMR/strength/reaction time.  Expand strength and balance HEP.  Core strength - work mat table level on tasks to improve floor recovery from stomach and supine.  Reaching floor/forward/overhead.  Ascent - ramp and stairs.  Perturbations/resisted gait/farmer's carry.  Lateral stepping and left knee flexion w/ swing through; Repeat LE stretch prn.    Pt felt challenged w/: Stairs/step taps/up; ramp Standing 10lb kettlebell hip flexion LLE (isometric DF) Walking beanbag DF  Olivia to join 11/10 session for further orthotic positioning assessment.  Pt declines offer of aquatic therapy at evaluation.  Daved KATHEE Bull, PT, DPT 04/04/2024, 6:30 PM

## 2024-04-04 NOTE — Patient Instructions (Signed)
Re/desensitization Techniques Some ways to Re/desensitize a hyper or hyposensitive ie) numb, tingling limb/area is by rubbing it with different textures. This will make your limb more tolerant/aware of touch and pressure. Before you begin, make sure your hands and the materials you're using are clean.  To rub your painful/sensitive area with different textures: Sit in a comfortable position with the numb/painful/sensitive area uncovered. Start with a material that is soft, such as a cotton ball, silk or soft towel. Rub your painful/sensitive area in all directions. Start with a light pressure and gradually increase the pressure. Vary the textures you use, as you can tolerate them. Start with soft materials like cotton balls or a makeup brush. Progress to materials that are rougher. Examples include a paper towel, cloth towel, wool, or velcro. As you progress, gradually increase the pressure and roughness of the texture you use. Be careful not to rub over any incisions or wounds, if you still have staples or sutures in place, or if there are any open areas. Rub your limb for at least 30 seconds progressing up to a minute or two as often as you can tolerate, or as recommended by your healthcare provider. Be careful not to irritate your skin or rub your skin raw.  Stop rubbing your affected area if you notice redness that does not dissipate in 15-30 minutes, bleeding or opening skin, and contact your healthcare provider.  Other methods for re/desensitization include: Allow cool or warm water to run over the area.  Be careful not to get the water too hot, especially if you have decreases sensation of temperature. Putting your affected area into a bowl of dry rice, sand, kidney beans, cold water or warm water. You can hide objects in the dry materials to find. Make a bag of miscellaneous matching objects to feel and match based on feel ie) paper clips, nuts, bolts, dice, marbles, checkers, dominos, beads  etc.  If you choose to start with the method of dipping your affected area into a medium such as rice, sand, or water make sure to move the affected area around in the bowl until you cannot tolerate it or you reach one to two minutes, whichever comes first. You can use a combination of these methods for 10 to 15 minutes, 3-4 times per day.

## 2024-04-07 ENCOUNTER — Ambulatory Visit

## 2024-04-07 ENCOUNTER — Ambulatory Visit: Admitting: Physical Therapy

## 2024-04-07 ENCOUNTER — Encounter: Payer: Self-pay | Admitting: Physical Therapy

## 2024-04-07 DIAGNOSIS — R209 Unspecified disturbances of skin sensation: Secondary | ICD-10-CM | POA: Diagnosis not present

## 2024-04-07 DIAGNOSIS — R29898 Other symptoms and signs involving the musculoskeletal system: Secondary | ICD-10-CM | POA: Diagnosis not present

## 2024-04-07 DIAGNOSIS — I69354 Hemiplegia and hemiparesis following cerebral infarction affecting left non-dominant side: Secondary | ICD-10-CM

## 2024-04-07 DIAGNOSIS — M6281 Muscle weakness (generalized): Secondary | ICD-10-CM

## 2024-04-07 DIAGNOSIS — R29818 Other symptoms and signs involving the nervous system: Secondary | ICD-10-CM | POA: Diagnosis not present

## 2024-04-07 DIAGNOSIS — R208 Other disturbances of skin sensation: Secondary | ICD-10-CM

## 2024-04-07 DIAGNOSIS — R278 Other lack of coordination: Secondary | ICD-10-CM

## 2024-04-07 DIAGNOSIS — R2681 Unsteadiness on feet: Secondary | ICD-10-CM | POA: Diagnosis not present

## 2024-04-07 NOTE — Therapy (Signed)
 OUTPATIENT PHYSICAL THERAPY NEURO TREATMENT   Patient Name: Donald [name] MRN: [id number] DOB:Dec 15, 1945, 78 y.o., male Today's Date: 04/07/2024   PCP: Kip Righter, MD REFERRING PROVIDER: Sherryl Bouchard, NP  END OF SESSION:  PT End of Session - 04/07/24 1451     Visit Number 9    Number of Visits 17   16 + eval   Date for Recertification  05/13/24   pushed out due to scheduling delay   Authorization Type Medicare A&B w/ [employer] supplement    Progress Note Due on Visit 10    PT Start Time 1447    PT Stop Time 1533    PT Time Calculation (min) 46 min    Equipment Utilized During Treatment Gait belt    Activity Tolerance Patient tolerated treatment well    Behavior During Therapy WFL for tasks assessed/performed          Past Medical History:  Diagnosis Date   Diabetes mellitus without complication (HCC)    Dysrhythmia    Hyperlipemia    Hypertension    large right middle cerebral artery infarct, 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 s/p IV tPA, b. source unknown, c. loop recorder placed   Snoring 07/03/2015   Past Surgical History:  Procedure Laterality Date   LOOP RECORDER IMPLANT  06/07/2013   MDT LinQ implanted by Dr Kelsie for cryptogenic stroke   LOOP RECORDER IMPLANT N/A 06/07/2013   Procedure: LOOP RECORDER IMPLANT;  Surgeon: Lynwood JONETTA Kelsie, MD;  Location: [facility] CATH LAB;  Service: Cardiovascular;  Laterality: N/A;   TEE WITHOUT CARDIOVERSION N/A 06/07/2013   Procedure: TRANSESOPHAGEAL ECHOCARDIOGRAM (TEE);  Surgeon: Leim VEAR Moose, MD;  Location: [facility] ENDOSCOPY;  Service: Cardiovascular;  Laterality: N/A;   TONSILLECTOMY     TRANSANAL HEMORRHOIDAL DEARTERIALIZATION N/A 02/18/2023   Procedure: TRANSANAL HEMORRHOIDAL DEARTERIALIZATION;  Surgeon: Debby Hila, MD;  Location: [facility] ORS;  Service: General;  Laterality: N/A;   UMBILICAL HERNIA REPAIR     Patient Active Problem List   Diagnosis Date Noted   Permanent atrial fibrillation (HCC) 05/17/2019   Acquired  thrombophilia 05/17/2019   Snoring 07/03/2015   Persistent atrial fibrillation (HCC) 04/16/2015   Hypokalemia 09/20/2014   HTN (hypertension) 09/19/2014   HLD (hyperlipidemia) 09/19/2014   Diabetes mellitus without complication (HCC) 09/19/2014   Jerking 09/19/2014   Alterations of sensations, late effect of cerebrovascular disease 11/07/2013   Disturbances of vision, late effect of cerebrovascular disease 11/07/2013   Sinus bradycardia 08/08/2013   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HCC) 07/26/2013   Left-sided neglect 07/26/2013   Small vessel disease 06/07/2013   Obesity, unspecified 06/07/2013   Cerebral infarction (HCC) 06/07/2013   large right middle cerebral artery infarct, 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   Hypertension 06/03/2013   Diabetes (HCC) 06/03/2013    ONSET DATE: 06/03/2013 (CVA)  REFERRING DIAG: P30.645 (ICD-10-CM) - Hemiparesis affecting left side as late effect of cerebrovascular accident (HCC)  THERAPY DIAG:  Other lack of coordination  Muscle weakness (generalized)  Hemiplegia and hemiparesis following cerebral infarction affecting left non-dominant side (HCC)  Other disturbances of skin sensation  Other symptoms and signs involving the musculoskeletal system  Other symptoms and signs involving the nervous system  Unsteadiness on feet  Unspecified disturbances of skin sensation  Rationale for Evaluation and Treatment: Rehabilitation  SUBJECTIVE:  SUBJECTIVE STATEMENT: [name]  Pt denies falls or acute status changes.  He denies pain. Pt accompanied by: self (Wife - waiting in car)  PERTINENT HISTORY: R MCA infarct 06/03/2013 S/p tPA, HTN, DM2, Afib, had 1 seizure in 2017  PAIN:  Are you having pain? No  PRECAUTIONS: Fall and Other: loop recorder  RED  FLAGS: None   WEIGHT BEARING RESTRICTIONS: No  FALLS: Has patient fallen in last 6 months? Yes. Number of falls 3  LIVING ENVIRONMENT: Lives with: lives with their spouse Lives in: House/apartment Stairs: No Has following equipment at home: Single point cane  PLOF: Requires assistive device for independence  PATIENT GOALS: To work on reaction time and getting off the floor  OBJECTIVE:  Note: Objective measures were completed at Evaluation unless otherwise noted.  DIAGNOSTIC FINDINGS:  Brain MRI 09/20/2014 IMPRESSION: 1. No acute infarct. 2. Chronic right MCA territory infarct. 3. Subtle T2 hyperintensity in the left hippocampus and left temporal lobe cortex versus artifact. Recent seizure activity is a consideration.  COGNITION: Overall cognitive status: History of cognitive impairments - at baseline - reports quicker to anger/verbalize emotions/sometimes overstimulated with sounds and distractions   SENSATION: Light touch: WFL - LLE hypersensitive/question of allodynia  COORDINATION: Mildly dysmetric LLE  EDEMA:  None significant in BLE  MUSCLE TONE: LLE: Modifed Ashworth Scale 1+ = Slight increase in muscle tone, manifested by a catch, followed by minimal resistance throughout the remainder (less than half) of the ROM  POSTURE: rounded shoulders, flexed trunk , and weight shift right - mildly flexed trunk and right weight shift in standing and with ambulation, L shoulder more rounded than R w/ mild shoulder hike  LOWER EXTREMITY ROM:     Active  Right Eval Left Eval  Hip flexion WNL WFL  Hip extension    Hip abduction    Hip adduction    Hip internal rotation    Hip external rotation    Knee flexion    Knee extension    Ankle dorsiflexion  2+  Ankle plantarflexion    Ankle inversion    Ankle eversion     (Blank rows = not tested)  LOWER EXTREMITY MMT:    MMT Right Eval Left Eval  Hip flexion    Hip extension    Hip abduction    Hip adduction     Hip internal rotation    Hip external rotation    Knee flexion    Knee extension    Ankle dorsiflexion    Ankle plantarflexion    Ankle inversion    Ankle eversion    (Blank rows = not tested)  BED MOBILITY:  Findings: Sit to supine Complete Independence Supine to sit Complete Independence Rolling to Right Complete Independence Rolling to Left Complete Independence - needs increased time and momentum for L and R Ind w/ features of tempurpedic style bed  TRANSFERS: Sit to stand: SBA  Assistive device utilized: Single point cane     Stand to sit: SBA  Assistive device utilized: Single point cane     Chair to chair: SBA  Assistive device utilized: Single point cane       RAMP:  Not tested  CURB:  Not tested  STAIRS: Not tested GAIT: Findings: Distance walked: various clinic distances and Comments: Pt has mild right reliance w/ SPC, no foot scuff/toe catch even without AFO, shortened stride and slightly diminished flexion pattern of LLE  FUNCTIONAL TESTS:  5 times sit to stand: 18.89 sec w/ heavy right  trunk shortening and RUE reliance w/ L toe down vs full weight bearing  PATIENT SURVEYS:  ABC scale: The Activities-Specific Balance Confidence (ABC) Scale TBA                                                                                                                              TREATMENT DATE: 04-07-24 -L DF slide x115' w/ SPC and CGA -Sitting L DF w/10 lb kettlebell DF w/ more isometric style contraction, pyramid 10>2 reps -6 step taps x20 BUE support > LLE step ups x10 > x4 > x2 (attempted to return to stairs after ramp, but LUE too sensitive to continue w/ BUE tasks) -Supine bridge hold w/ march 5x10 -Hook-lying dowel rod push ups into mini crunch 2x10 -Bilateral LE raise in supine 2x10, decreased reaction time of LLE  PATIENT EDUCATION: Education details:  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 Person educated: Patient Education method:  Medical Illustrator Education comprehension: verbalized understanding and needs further education  HOME EXERCISE PROGRAM: Access Code: [facility] URL: [email or web address] Date: 03/21/2024 Prepared by: Daved Bull  Exercises - Seated Toe Raise  - 1-2 x daily - 4 x weekly - 2 sets - 10 reps - 2-3 seconds hold - Ankle Inversion Eversion Towel Slide  - 1-2 x daily - 4 x weekly - 2 sets - 10 reps - Seated Ankle Circles  - 1-2 x daily - 4 x weekly - 2 sets - 10 reps - Seated Heel Raise  - 1-2 x daily - 4 x weekly - 2 sets - 10 reps - 2-3 seconds hold - Seated Ankle Inversion Eversion PROM  - 1 x daily - 7 x weekly - 2 sets - 10 reps - Staggered Sit-to-Stand  - 1 x daily - 4-5 x weekly - 2-3 sets - 10 reps - Seated Hamstring Stretch  - 1 x daily - 4-5 x weekly - 1 sets - 3-4 reps - 45 seconds hold - Hooklying Single Knee to Chest Stretch with Towel  - 1 x daily - 4-5 x weekly - 1 sets - 3-4 reps - 45 seconds hold - Seated Toe Towel Scrunches  - 1 x daily - 4 x weekly - 1-2 sets - 10 reps - Seated Arch Lifts  - 1 x daily - 4 x weekly - 1-2 sets - 10 reps  Desensitization Techniques: -Light touch/pressure:  using fingertip pressure to slowly move up small segments of the affected body part until outside the area of pain and then back to where you started -Deep pressure:  using fingertips to squeeze in small segments starting outside the affected area, crossing over the affected area, and then to the other side of the affected area -Tapping:  fingertips tap with firm pressure over the affected area, can move around the affected area as well -Brushing/scratching:  use fingertips to brush/scratch over and around the affected area -Textures:  use soft and rough textured items  like cotton balls vs wash cloths to brush or rub with light into firm pressure over and around the affected area  Repeat these 3-4x per day.   GOALS: Goals reviewed with patient? Yes  SHORT  TERM GOALS: Target date: 04/08/2024  Pt will be independent and compliant with introductory strength and balance HEP in order to maintain functional progress and improve mobility. Baseline:  To be established. Goal status: INITIAL  2.  Pt will demonstrate adequate balance strategies to maintain upright with forward/overhead/and floor reaching. Baseline:  Very narrow limits of stability - several near falls related to this. Goal status: INITIAL  3.  Pt will be independent with desensitization strategies for LLE hypersensitivity management. Baseline: To be provided. Goal status: INITIAL  LONG TERM GOALS: Target date: 05/06/2024  Pt will be independent and compliant with advanced and finalized strength and balance HEP in order to maintain functional progress and improve mobility. Baseline: To be established. Goal status: INITIAL  2.  Patient will improve ABC Scale score to >/=76% in order to demonstrate decreased risk for falls and increased confidence in balance. Baseline: 66.875% (10/14) Goal status: INITIAL  3.  Patient will demonstrate floor recovery w/ no more than SBA in order to improve safety and home management. Baseline: Reports recent minA even w/ support surface Goal status: INITIAL  4.  Pt will decrease 5xSTS to </=12 seconds w/ improved midline mechanics in order to demonstrate decreased risk for falls and improved functional bilateral LE strength and power. Baseline: 18.89 sec w/ heavy right trunk shortening and RUE reliance w/ L toe down vs full weight bearing Goal status: INITIAL  ASSESSMENT:  CLINICAL IMPRESSION: Emphasis of skilled session today on addressing core strength and repeating ankle stability tasks that have proven beneficial for patient.  He demonstrates some improved DF engagement with seated and standing tasks today.  His tone interfered with beanbag sliding less today and he was able to progress to longer strides.  His upper core activation appeared  stronger than lower and he would benefit from further work to activate his TrA to better support standing stability and balance strategies.  He will be further assessed for bracing options by orthotist next session to hopefully wrap up bracing needs.  Continue per POC.  OBJECTIVE IMPAIRMENTS: Abnormal gait, decreased activity tolerance, decreased balance, decreased cognition, decreased coordination, decreased endurance, decreased knowledge of use of DME, decreased strength, impaired sensation, impaired tone, improper body mechanics, and postural dysfunction.   ACTIVITY LIMITATIONS: carrying, lifting, bending, squatting, stairs, transfers, reach over head, and locomotion level  PARTICIPATION LIMITATIONS: laundry, shopping, and community activity  PERSONAL FACTORS: Age, Fitness, Past/current experiences, Time since onset of injury/illness/exacerbation, and 1-2 comorbidities: HTN, Afib are also affecting patient's functional outcome.   REHAB POTENTIAL: Good  CLINICAL DECISION MAKING: Stable/uncomplicated  EVALUATION COMPLEXITY: Moderate  PLAN:  PT FREQUENCY: 2x/week  PT DURATION: 8 weeks  PLANNED INTERVENTIONS: [id number]- PT Re-evaluation, 97750- Physical Performance Testing, 97110-Therapeutic exercises, 97530- Therapeutic activity, [id number]- Neuromuscular re-education, 97535- Self Care, [id number]- Manual therapy, [id number]- Gait training, [phone]- Orthotic Initial, [phone]- Orthotic/Prosthetic subsequent, [phone]- Aquatic Therapy, [phone]- Electrical stimulation (manual), Patient/Family education, Balance training, Stair training, Taping, Joint mobilization, Vestibular training, and DME instructions  PLAN FOR NEXT SESSION: trial air cast again if pt agrees - may benefit for use during SLS in clinic; Lt hip strengthening  Gait training w/ SPC.  Floor recovery - incorporate tight space setup - repeat w/ full transition to bottom and incorporate LLE use to recover to standing.  LLE NMR/strength/reaction time.  Expand  strength and balance HEP.  Core strength - work mat table level on tasks to improve floor recovery from stomach and supine.  Reaching floor/forward/overhead.  Ascent - ramp and stairs.  Perturbations/resisted gait/farmer's carry.  Lateral stepping and left knee flexion w/ swing through; Repeat LE stretch prn.    Pt felt challenged w/: Stairs/step taps/up; ramp Standing 10lb kettlebell hip flexion LLE (isometric DF) Walking beanbag DF  Olivia to join 11/10 session for further orthotic positioning assessment.  Pt declines offer of aquatic therapy at evaluation.  Daved KATHEE Bull, PT, DPT 04/07/2024, 3:39 PM

## 2024-04-07 NOTE — Therapy (Addendum)
 OUTPATIENT OCCUPATIONAL THERAPY NEURO TREATMENT  Patient Name: Donald [name] MRN: [id number] DOB:May 18, 1946, 78 y.o., male Today's Date: 04/07/2024  PCP: Kip Righter, MD REFERRING PROVIDER: Sherryl Bouchard, NP  END OF SESSION:  OT End of Session - 04/07/24 1539     Visit Number 2    Number of Visits 17    Date for Recertification  06/04/24    Authorization Type MCR A&B/AARP    Progress Note Due on Visit 10    OT Start Time 1402    OT Stop Time 1445    OT Time Calculation (min) 43 min    Activity Tolerance Patient tolerated treatment well    Behavior During Therapy WFL for tasks assessed/performed          Past Medical History:  Diagnosis Date   Diabetes mellitus without complication (HCC)    Dysrhythmia    Hyperlipemia    Hypertension    large right middle cerebral artery infarct, 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 s/p IV tPA, b. source unknown, c. loop recorder placed   Snoring 07/03/2015   Past Surgical History:  Procedure Laterality Date   LOOP RECORDER IMPLANT  06/07/2013   MDT LinQ implanted by Dr Kelsie for cryptogenic stroke   LOOP RECORDER IMPLANT N/A 06/07/2013   Procedure: LOOP RECORDER IMPLANT;  Surgeon: Lynwood JONETTA Kelsie, MD;  Location: [facility];  Service: Cardiovascular;  Laterality: N/A;   TEE WITHOUT CARDIOVERSION N/A 06/07/2013   Procedure: TRANSESOPHAGEAL ECHOCARDIOGRAM (TEE);  Surgeon: Leim VEAR Moose, MD;  Location: [facility] ENDOSCOPY;  Service: Cardiovascular;  Laterality: N/A;   TONSILLECTOMY     TRANSANAL HEMORRHOIDAL DEARTERIALIZATION N/A 02/18/2023   Procedure: TRANSANAL HEMORRHOIDAL DEARTERIALIZATION;  Surgeon: Debby Hila, MD;  Location: [facility];  Service: General;  Laterality: N/A;   UMBILICAL HERNIA REPAIR     Patient Active Problem List   Diagnosis Date Noted   Permanent atrial fibrillation (HCC) 05/17/2019   Acquired thrombophilia 05/17/2019   Snoring 07/03/2015   Persistent atrial fibrillation (HCC) 04/16/2015   Hypokalemia 09/20/2014    HTN (hypertension) 09/19/2014   HLD (hyperlipidemia) 09/19/2014   Diabetes mellitus without complication (HCC) 09/19/2014   Jerking 09/19/2014   Alterations of sensations, late effect of cerebrovascular disease 11/07/2013   Disturbances of vision, late effect of cerebrovascular disease 11/07/2013   Sinus bradycardia 08/08/2013   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HCC) 07/26/2013   Left-sided neglect 07/26/2013   Small vessel disease 06/07/2013   Obesity, unspecified 06/07/2013   Cerebral infarction (HCC) 06/07/2013   large right middle cerebral artery infarct, 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  Pt accompanied by: self  PERTINENT HISTORY: R MCA infarct 06/03/2013 S/p tPA, HTN, DM2, Afib, had 1 seizure in 2017   DIAGNOSTIC FINDINGS:  Brain MRI 09/20/2014 IMPRESSION: 1. No acute infarct. 2. Chronic right MCA territory infarct. 3. Subtle T2 hyperintensity in the left hippocampus and left temporal lobe cortex versus artifact. Recent seizure activity is a consideration  PRECAUTIONS: Fall and Other: loop recorder, L sided weakness  WEIGHT BEARING RESTRICTIONS: No  PAIN:  Are you  having pain? No  FALLS: Has patient fallen in last 6 months? Yes. Number of falls 4 in the last year  LIVING ENVIRONMENT: Lives with: lives with their spouse Lives in: House/apartment 1 level Stairs: No Has following equipment at home: Single point cane, shower  chair/bench with back, walk in shower  PLOF: Requires assistive device for independence  PATIENT GOALS: I'd like to be able to use my left hand more functionally. I want to improve my coordination and reaction time.  OBJECTIVE:  Note: Objective measures were completed at Evaluation unless otherwise noted.  HAND DOMINANCE: Right  ADLs: Overall ADLs: Independent  Equipment: Shower seat with back and Walk in shower  IADLs: Cooks and cleans bathrooms and kitchen Shopping:  Light housekeeping: Independent Meal Prep: Independent Community mobility: Wife completes  Medication management: Independent Landscape architect: Independent Handwriting: did not assess d/t nondominant L hand being affected  MOBILITY STATUS: ambulates with SPC  POSTURE COMMENTS:  rounded shoulders, flexed trunk , and weight shift right - mildly flexed trunk and right weight shift in standing and with ambulation, L shoulder more rounded than R w/ mild shoulder hike   ACTIVITY TOLERANCE: Activity tolerance: no changes  FUNCTIONAL OUTCOME MEASURES: PSFS:   UPPER EXTREMITY ROM:  WNL  Active ROM Right eval Left eval  Shoulder flexion    Shoulder abduction    Shoulder adduction    Shoulder extension    Shoulder internal rotation    Shoulder external rotation    Elbow flexion    Elbow extension    Wrist flexion    Wrist extension    Wrist ulnar deviation    Wrist radial deviation    Wrist pronation    Wrist supination    (Blank rows = not tested)  UPPER EXTREMITY MMT:   4-/5 shoulder   MMT Right eval Left eval  Shoulder flexion    Shoulder abduction    Shoulder adduction    Shoulder extension    Shoulder internal rotation    Shoulder external rotation    Middle trapezius    Lower trapezius    Elbow flexion    Elbow extension    Wrist flexion    Wrist extension    Wrist ulnar deviation    Wrist radial deviation    Wrist pronation    Wrist supination    (Blank rows = not  tested)  HAND FUNCTION: Grip strength: Right: 73.1 lbs; Left: 58.2 lbs  COORDINATION: 9 Hole Peg test: Right: 26.32 sec; Left: 65.08 sec Box and Blocks:  Right 49 blocks, Left 29 blocks  SENSATION: Reports hypersensitivity in fingertips  EDEMA: Pt reports hx of edema but it is rare now.  MUSCLE TONE: LUE: Modifed Ashworth Scale 1 = Slight increase in muscle tone, manifested by a catch and release or by minimal resistance at the end of the range of motion when the affected part(s) is moved in flexion or extension  COGNITION: Overall cognitive status: Within functional limits for tasks assessed  VISION: Subjective report: Pt reports having small cataracts but it is not affecting vision at this time Baseline vision: Wears glasses all the time Visual history: cataracts well kept  VISION ASSESSMENT: To be further assessed in functional context    PERCEPTION: WFL  PRAXIS: WFL  OBSERVATIONS: Impaired FM coordination, grip strength, strength in L hand and shoulder, hypersensitivity in fingertips of L hand  TREATMENT DATE: 04/04/24  Added onto theraputty HEP to improve grip strength in L hand to carry over with IADL and leisure tasks. Educated pt in FM coordination HEP to improve dexterity in L hand for playing guitar and other desired leisure tasks. Min cues required for proper form of certain coordination tasks. Pt also encouraged to complete typing tasks with L hand, given handout of L handed typing words to hone coordination and reaction time.      PATIENT EDUCATION: Education details: SEE ABOVE Person educated: Patient Education method: Explanation, Demonstration, and Handouts Education comprehension: verbalized understanding and needs further education  HOME EXERCISE PROGRAM: 04/04/24: Desensitization 04/07/24: Putty,  coordination   GOALS: Goals reviewed with patient? Yes  SHORT TERM GOALS: Target date: 05/04/24  Pt will be independent with HEP completion for coordination and strengthening Baseline: New to OP OT Goal status: INITIAL  2.  Pt will be independent with desensitization techniques Baseline: Educated  Goal status: IN PROGRESS  3.  Pt will increase L shoulder strength to 4/5 Baseline: 4-/5  Goal status: INITIAL  4.  Pt will demonstrate improved coordination in L hand by a score of 34 blocks in Box and Blocks Test Baseline: Right 49 blocks, Left 29 blocks Goal status: INITIAL  LONG TERM GOALS: Target date: 06/04/24  Pt will demonstrate an increase of at least 2.0 score of average PSFS score or a 2 point increase in individual activity score in PSFS Baseline:  Goal status: INITIAL  2.  Pt will demonstrate improved L hand FM coordination by a score of 60 seconds or less in 9HPT Baseline: Right: 26.32 sec; Left: 65.08 sec Goal status: INITIAL  3.  Pt will demonstrate improved grip strength in L hand by scoring at least 65 pounds Baseline: Right: 73.1 lbs; Left: 58.2 lbs Goal status: INITIAL  4.  Pt will demonstrate improved L coordination by a score of at least 39 blocks on Box and Blocks test Baseline: 29 blocks Goal status: INITIAL   ASSESSMENT:  CLINICAL IMPRESSION: Patient is a 78 y.o. male who was seen today for occupational therapy tx for old CVA with L sided weakness and impaired coordination and hypersensitivity. Hx includes HTN, DM2, Afib, had 1 seizure in 2017. Pt participating well with HEPs and motivated to participate. Pt would benefit from continued skilled OT services in the outpatient setting to work on impairments as noted below to help pt return to PLOF as able.     PERFORMANCE DEFICITS: in functional skills including IADLs, coordination, dexterity, sensation, strength, Fine motor control, Gross motor control, decreased knowledge of precautions, and UE functional  use, cognitive skills including safety awareness, and psychosocial skills including coping strategies and environmental adaptation.   IMPAIRMENTS: are limiting patient from IADLs, work, and leisure.   CO-MORBIDITIES: may have co-morbidities  that affects occupational performance. Patient will benefit from skilled OT to address above impairments and improve overall function.  MODIFICATION OR ASSISTANCE TO COMPLETE EVALUATION: Min-Moderate modification of tasks or assist with assess necessary to complete an evaluation.  OT OCCUPATIONAL PROFILE AND HISTORY: Detailed assessment: Review of records and additional review of physical, cognitive, psychosocial history related to current functional performance.  CLINICAL DECISION MAKING: Moderate - several treatment options, min-mod task modification necessary  REHAB POTENTIAL: Good  EVALUATION COMPLEXITY: Moderate    PLAN:  OT FREQUENCY: 2x/week  OT DURATION: 8 weeks  PLANNED INTERVENTIONS: 97168 OT Re-evaluation, 97535 self care/ADL training, 02889 therapeutic exercise, 97530 therapeutic activity, 97112 neuromuscular re-education, passive range of motion, psychosocial  skills training, coping strategies training, patient/family education, and DME and/or AE instructions  RECOMMENDED OTHER SERVICES: none at this time  CONSULTED AND AGREED WITH PLAN OF CARE: Patient  PLAN FOR NEXT SESSION: Coordination activities Grip activities L shoulder HEP   Rocky Dutch, OT 04/07/2024, 3:40 PM

## 2024-04-07 NOTE — Patient Instructions (Addendum)
  Coordination Activities  Perform the following activities for 10-15 minutes 1 times per day with left hand(s).  Rotate putty container in fingertips (clockwise and counter-clockwise). Toss ball between hands. Toss ball in air and catch with the same hand. Flip cards 1 at a time as fast as you can. Deal cards with your thumb (Hold deck in hand and push card off top with thumb). Pick up coins one at a time until you get 5-10 in your hand, then move coins from palm to fingertips to stack one at a time. Screw together nuts and bolts, then unfasten. Tear a tissue into long strips, then wad each strip into a tiny ball.

## 2024-04-11 ENCOUNTER — Ambulatory Visit

## 2024-04-11 ENCOUNTER — Encounter: Payer: Self-pay | Admitting: Physical Therapy

## 2024-04-11 ENCOUNTER — Ambulatory Visit: Admitting: Physical Therapy

## 2024-04-11 DIAGNOSIS — M6281 Muscle weakness (generalized): Secondary | ICD-10-CM

## 2024-04-11 DIAGNOSIS — I10 Essential (primary) hypertension: Secondary | ICD-10-CM | POA: Diagnosis not present

## 2024-04-11 DIAGNOSIS — R278 Other lack of coordination: Secondary | ICD-10-CM | POA: Diagnosis not present

## 2024-04-11 DIAGNOSIS — I69354 Hemiplegia and hemiparesis following cerebral infarction affecting left non-dominant side: Secondary | ICD-10-CM

## 2024-04-11 DIAGNOSIS — R209 Unspecified disturbances of skin sensation: Secondary | ICD-10-CM | POA: Diagnosis not present

## 2024-04-11 DIAGNOSIS — R208 Other disturbances of skin sensation: Secondary | ICD-10-CM

## 2024-04-11 DIAGNOSIS — R29898 Other symptoms and signs involving the musculoskeletal system: Secondary | ICD-10-CM

## 2024-04-11 DIAGNOSIS — R2681 Unsteadiness on feet: Secondary | ICD-10-CM | POA: Diagnosis not present

## 2024-04-11 DIAGNOSIS — R29818 Other symptoms and signs involving the nervous system: Secondary | ICD-10-CM | POA: Diagnosis not present

## 2024-04-11 DIAGNOSIS — E1169 Type 2 diabetes mellitus with other specified complication: Secondary | ICD-10-CM | POA: Diagnosis not present

## 2024-04-11 DIAGNOSIS — I4891 Unspecified atrial fibrillation: Secondary | ICD-10-CM | POA: Diagnosis not present

## 2024-04-11 NOTE — Therapy (Signed)
 OUTPATIENT OCCUPATIONAL THERAPY NEURO TREATMENT  Patient Name: Donald [name] MRN: [id number] DOB:1945/07/07, 78 y.o., male Today's Date: 04/11/2024  PCP: Kip Righter, MD REFERRING PROVIDER: Sherryl Bouchard, NP  END OF SESSION:  OT End of Session - 04/11/24 1407     Visit Number 3    Number of Visits 17    Date for Recertification  06/04/24    Authorization Type MCR A&B/AARP    Progress Note Due on Visit 10    OT Start Time 1404    OT Stop Time 1445    OT Time Calculation (min) 41 min    Activity Tolerance Patient tolerated treatment well    Behavior During Therapy [facility] for tasks assessed/performed           Past Medical History:  Diagnosis Date   Diabetes mellitus without complication (HCC)    Dysrhythmia    Hyperlipemia    Hypertension    large right middle cerebral artery infarct, 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 s/p IV tPA, b. source unknown, c. loop recorder placed   Snoring 07/03/2015   Past Surgical History:  Procedure Laterality Date   LOOP RECORDER IMPLANT  06/07/2013   MDT LinQ implanted by Dr Kelsie for cryptogenic stroke   LOOP RECORDER IMPLANT N/A 06/07/2013   Procedure: LOOP RECORDER IMPLANT;  Surgeon: Lynwood JONETTA Kelsie, MD;  Location: [facility]ATH [facility];  Service: Cardiovascular;  Laterality: N/A;   TEE WITHOUT CARDIOVERSION N/A 06/07/2013   Procedure: TRANSESOPHAGEAL ECHOCARDIOGRAM (TEE);  Surgeon: Leim VEAR Moose, MD;  Location: [facility] ENDOSCOPY;  Service: Cardiovascular;  Laterality: N/A;   TONSILLECTOMY     TRANSANAL HEMORRHOIDAL DEARTERIALIZATION N/A 02/18/2023   Procedure: TRANSANAL HEMORRHOIDAL DEARTERIALIZATION;  Surgeon: Debby Hila, MD;  Location: [facility];  Service: General;  Laterality: N/A;   UMBILICAL HERNIA REPAIR     Patient Active Problem List   Diagnosis Date Noted   Permanent atrial fibrillation (HCC) 05/17/2019   Acquired thrombophilia 05/17/2019   Snoring 07/03/2015   Persistent atrial fibrillation (HCC) 04/16/2015   Hypokalemia 09/20/2014    HTN (hypertension) 09/19/2014   HLD (hyperlipidemia) 09/19/2014   Diabetes mellitus without complication (HCC) 09/19/2014   Jerking 09/19/2014   Alterations of sensations, late effect of cerebrovascular disease 11/07/2013   Disturbances of vision, late effect of cerebrovascular disease 11/07/2013   Sinus bradycardia 08/08/2013   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HCC) 07/26/2013   Left-sided neglect 07/26/2013   Small vessel disease 06/07/2013   Obesity, unspecified 06/07/2013   Cerebral infarction (HCC) 06/07/2013   large right middle cerebral artery infarct, 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   Hypertension 06/03/2013   Diabetes (HCC) 06/03/2013    ONSET DATE: 06/03/2013 (CVA), 03/15/2024 referral date  REFERRING DIAG: P30.645 (ICD-10-CM) - Hemiplegia and hemiparesis following cerebral infarction affecting left non-dominant side (HCC)  THERAPY DIAG:  Muscle weakness (generalized)  Other lack of coordination  Hemiplegia and hemiparesis following cerebral infarction affecting left non-dominant side (HCC)  Other symptoms and signs involving the musculoskeletal system  Other disturbances of skin sensation  Rationale for Evaluation and Treatment: Rehabilitation  SUBJECTIVE:   SUBJECTIVE STATEMENT: Pt reports doing some of the exercises at home but not all of them d/t being busy.  Pt accompanied by: self  PERTINENT HISTORY: R MCA infarct 06/03/2013 S/p tPA, HTN, DM2, Afib, had 1 seizure in 2017   DIAGNOSTIC FINDINGS:  Brain MRI 09/20/2014 IMPRESSION: 1. No acute infarct. 2. Chronic right MCA territory infarct. 3. Subtle T2 hyperintensity in the left hippocampus and left temporal lobe cortex versus artifact.  Recent seizure activity is a consideration  PRECAUTIONS: Fall and Other: loop recorder, L sided weakness  WEIGHT BEARING RESTRICTIONS: No  PAIN:  Are you having pain? No  FALLS: Has patient fallen in last 6 months? Yes. Number of falls 4 in the last year  LIVING  ENVIRONMENT: Lives with: lives with their spouse Lives in: House/apartment 1 level Stairs: No Has following equipment at home: Single point cane, shower chair/bench with back, walk in shower  PLOF: Requires assistive device for independence  PATIENT GOALS: I'd like to be able to use my left hand more functionally. I want to improve my coordination and reaction time.  OBJECTIVE:  Note: Objective measures were completed at Evaluation unless otherwise noted.  HAND DOMINANCE: Right  ADLs: Overall ADLs: Independent  Equipment: Shower seat with back and Walk in shower  IADLs: Cooks and cleans bathrooms and kitchen Shopping:  Light housekeeping: Independent Meal Prep: Independent Community mobility: Wife completes  Medication management: Independent Landscape architect: Independent Handwriting: did not assess d/t nondominant L hand being affected  MOBILITY STATUS: ambulates with SPC  POSTURE COMMENTS:  rounded shoulders, flexed trunk , and weight shift right - mildly flexed trunk and right weight shift in standing and with ambulation, L shoulder more rounded than R w/ mild shoulder hike   ACTIVITY TOLERANCE: Activity tolerance: no changes  FUNCTIONAL OUTCOME MEASURES: PSFS:   UPPER EXTREMITY ROM:  WNL  Active ROM Right eval Left eval  Shoulder flexion    Shoulder abduction    Shoulder adduction    Shoulder extension    Shoulder internal rotation    Shoulder external rotation    Elbow flexion    Elbow extension    Wrist flexion    Wrist extension    Wrist ulnar deviation    Wrist radial deviation    Wrist pronation    Wrist supination    (Blank rows = not tested)  UPPER EXTREMITY MMT:   4-/5 shoulder   MMT Right eval Left eval  Shoulder flexion    Shoulder abduction    Shoulder adduction    Shoulder extension    Shoulder internal rotation    Shoulder external rotation    Middle trapezius    Lower trapezius    Elbow flexion    Elbow extension     Wrist flexion    Wrist extension    Wrist ulnar deviation    Wrist radial deviation    Wrist pronation    Wrist supination    (Blank rows = not tested)  HAND FUNCTION: Grip strength: Right: 73.1 lbs; Left: 58.2 lbs  COORDINATION: 9 Hole Peg test: Right: 26.32 sec; Left: 65.08 sec Box and Blocks:  Right 49 blocks, Left 29 blocks  SENSATION: Reports hypersensitivity in fingertips  EDEMA: Pt reports hx of edema but it is rare now.  MUSCLE TONE: LUE: Modifed Ashworth Scale 1 = Slight increase in muscle tone, manifested by a catch and release or by minimal resistance at the end of the range of motion when the affected part(s) is moved in flexion or extension  COGNITION: Overall cognitive status: Within functional limits for tasks assessed  VISION: Subjective report: Pt reports having small cataracts but it is not affecting vision at this time Baseline vision: Wears glasses all the time Visual history: cataracts well kept  VISION ASSESSMENT: To be further assessed in functional context    PERCEPTION: WFL  PRAXIS: WFL  OBSERVATIONS: Impaired FM coordination, grip strength, strength in L hand and shoulder, hypersensitivity  in fingertips of L hand                                                                                                                             TREATMENT DATE: 04/11/24  Tx this date focusing on FM coordination to carry over with leisure tasks such as playing guitar. Pt participated in peg activity with small pegs recreating pattern on board. Next completed tip to tip pinch opening and closing clothespins of varying resistances and placing on lines. With clamp at level 2 resistance with yellow coil, grasped large pegs on pegboard to hone grip strength of L hand. Pt reported some hypersensitivity in L fingertips with this activity. With tip to tip pinch engaged in block stacking game, min cues for proper participation.       PATIENT  EDUCATION: Education details: SEE ABOVE Person educated: Patient Education method: Explanation, Demonstration, and Handouts Education comprehension: verbalized understanding and needs further education  HOME EXERCISE PROGRAM: 04/04/24: Desensitization 04/07/24: Putty, coordination   GOALS: Goals reviewed with patient? Yes  SHORT TERM GOALS: Target date: 05/04/24  Pt will be independent with HEP completion for coordination and strengthening Baseline: New to OP OT Goal status: INITIAL  2.  Pt will be independent with desensitization techniques Baseline: Educated  Goal status: IN PROGRESS  3.  Pt will increase L shoulder strength to 4/5 Baseline: 4-/5  Goal status: INITIAL  4.  Pt will demonstrate improved coordination in L hand by a score of 34 blocks in Box and Blocks Test Baseline: Right 49 blocks, Left 29 blocks Goal status: INITIAL  LONG TERM GOALS: Target date: 06/04/24  Pt will demonstrate an increase of at least 2.0 score of average PSFS score or a 2 point increase in individual activity score in PSFS Baseline:  Goal status: INITIAL  2.  Pt will demonstrate improved L hand FM coordination by a score of 60 seconds or less in 9HPT Baseline: Right: 26.32 sec; Left: 65.08 sec Goal status: INITIAL  3.  Pt will demonstrate improved grip strength in L hand by scoring at least 65 pounds Baseline: Right: 73.1 lbs; Left: 58.2 lbs Goal status: INITIAL  4.  Pt will demonstrate improved L coordination by a score of at least 39 blocks on Box and Blocks test Baseline: 29 blocks Goal status: INITIAL   ASSESSMENT:  CLINICAL IMPRESSION: Patient is a 78 y.o. male who was seen today for occupational therapy tx for old CVA with L sided weakness and impaired coordination and hypersensitivity. Hx includes HTN, DM2, Afib, had 1 seizure in 2017. Pt participating well with skilled services, however limited participation in HEPs but this was d/t being busy with family this past weekend. Pt  would benefit from continued skilled OT services in the outpatient setting to work on impairments as noted below to help pt return to PLOF as able.     PERFORMANCE DEFICITS: in functional skills including IADLs, coordination, dexterity, sensation, strength, Fine motor control,  Gross motor control, decreased knowledge of precautions, and UE functional use, cognitive skills including safety awareness, and psychosocial skills including coping strategies and environmental adaptation.   IMPAIRMENTS: are limiting patient from IADLs, work, and leisure.   CO-MORBIDITIES: may have co-morbidities  that affects occupational performance. Patient will benefit from skilled OT to address above impairments and improve overall function.  MODIFICATION OR ASSISTANCE TO COMPLETE EVALUATION: Min-Moderate modification of tasks or assist with assess necessary to complete an evaluation.  OT OCCUPATIONAL PROFILE AND HISTORY: Detailed assessment: Review of records and additional review of physical, cognitive, psychosocial history related to current functional performance.  CLINICAL DECISION MAKING: Moderate - several treatment options, min-mod task modification necessary  REHAB POTENTIAL: Good  EVALUATION COMPLEXITY: Moderate    PLAN:  OT FREQUENCY: 2x/week  OT DURATION: 8 weeks  PLANNED INTERVENTIONS: 97168 OT Re-evaluation, 97535 self care/ADL training, 02889 therapeutic exercise, 97530 therapeutic activity, 97112 neuromuscular re-education, passive range of motion, psychosocial skills training, coping strategies training, patient/family education, and DME and/or AE instructions  RECOMMENDED OTHER SERVICES: none at this time  CONSULTED AND AGREED WITH PLAN OF CARE: Patient  PLAN FOR NEXT SESSION: Coordination activities Grip activities L shoulder HEP   Alonnie Bieker, OT 04/11/2024, 2:07 PM

## 2024-04-11 NOTE — Therapy (Unsigned)
 OUTPATIENT PHYSICAL THERAPY NEURO TREATMENT - 10th VISIT PN   Patient Name: Donald [name] MRN: [id number] DOB:Jul 23, 1945, 78 y.o., male Today's Date: 04/11/2024  PT progress note for [employer].  Reporting period 03/09/2024 to 04/11/2024  See Note below for Objective Data and Assessment of Progress/Goals  Thank you for the referral of this patient. Daved Bull, PT, DPT  PCP: Kip Righter, MD REFERRING PROVIDER: Sherryl Bouchard, NP  END OF SESSION:  PT End of Session - 04/11/24 1522     Visit Number 10    Number of Visits 17   16 + eval   Date for Recertification  05/13/24   pushed out due to scheduling delay   Authorization Type Medicare A&B w/ [employer] supplement    Progress Note Due on Visit 10    PT Start Time 1450    PT Stop Time 1532    PT Time Calculation (min) 42 min    Equipment Utilized During Treatment Gait belt    Activity Tolerance Patient tolerated treatment well    Behavior During Therapy WFL for tasks assessed/performed          Past Medical History:  Diagnosis Date   Diabetes mellitus without complication (HCC)    Dysrhythmia    Hyperlipemia    Hypertension    large right middle cerebral artery infarct, 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 s/p IV tPA, b. source unknown, c. loop recorder placed   Snoring 07/03/2015   Past Surgical History:  Procedure Laterality Date   LOOP RECORDER IMPLANT  06/07/2013   MDT LinQ implanted by Dr Kelsie for cryptogenic stroke   LOOP RECORDER IMPLANT N/A 06/07/2013   Procedure: LOOP RECORDER IMPLANT;  Surgeon: Lynwood JONETTA Kelsie, MD;  Location: [facility] CATH LAB;  Service: Cardiovascular;  Laterality: N/A;   TEE WITHOUT CARDIOVERSION N/A 06/07/2013   Procedure: TRANSESOPHAGEAL ECHOCARDIOGRAM (TEE);  Surgeon: Leim VEAR Moose, MD;  Location: [facility] ENDOSCOPY;  Service: Cardiovascular;  Laterality: N/A;   TONSILLECTOMY     TRANSANAL HEMORRHOIDAL DEARTERIALIZATION N/A 02/18/2023   Procedure: TRANSANAL HEMORRHOIDAL  DEARTERIALIZATION;  Surgeon: Debby Hila, MD;  Location: [facility] ORS;  Service: General;  Laterality: N/A;   UMBILICAL HERNIA REPAIR     Patient Active Problem List   Diagnosis Date Noted   Permanent atrial fibrillation (HCC) 05/17/2019   Acquired thrombophilia 05/17/2019   Snoring 07/03/2015   Persistent atrial fibrillation (HCC) 04/16/2015   Hypokalemia 09/20/2014   HTN (hypertension) 09/19/2014   HLD (hyperlipidemia) 09/19/2014   Diabetes mellitus without complication (HCC) 09/19/2014   Jerking 09/19/2014   Alterations of sensations, late effect of cerebrovascular disease 11/07/2013   Disturbances of vision, late effect of cerebrovascular disease 11/07/2013   Sinus bradycardia 08/08/2013   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HCC) 07/26/2013   Left-sided neglect 07/26/2013   Small vessel disease 06/07/2013   Obesity, unspecified 06/07/2013   Cerebral infarction (HCC) 06/07/2013   large right middle cerebral artery infarct, 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   Hypertension 06/03/2013   Diabetes (HCC) 06/03/2013    ONSET DATE: 06/03/2013 (CVA)  REFERRING DIAG: P30.645 (ICD-10-CM) - Hemiparesis affecting left side as late effect of cerebrovascular accident (HCC)  THERAPY DIAG:  Other lack of coordination  Muscle weakness (generalized)  Other symptoms and signs involving the musculoskeletal system  Hemiplegia and hemiparesis following cerebral infarction affecting left non-dominant side (HCC)  Other disturbances of skin sensation  Other symptoms and signs involving the nervous system  Unsteadiness on feet  Unspecified disturbances of skin sensation  Rationale for Evaluation  and Treatment: Rehabilitation  SUBJECTIVE:                                                                                                                                                                                             SUBJECTIVE STATEMENT: Donald [name]  Pt denies falls or acute status changes.   He denies pain. Pt accompanied by: self (Wife - waiting in car)  PERTINENT HISTORY: R MCA infarct 06/03/2013 S/p tPA, HTN, DM2, Afib, had 1 seizure in 2017  PAIN:  Are you having pain? No  PRECAUTIONS: Fall and Other: loop recorder  RED FLAGS: None   WEIGHT BEARING RESTRICTIONS: No  FALLS: Has patient fallen in last 6 months? Yes. Number of falls 3  LIVING ENVIRONMENT: Lives with: lives with their spouse Lives in: House/apartment Stairs: No Has following equipment at home: Single point cane  PLOF: Requires assistive device for independence  PATIENT GOALS: To work on reaction time and getting off the floor  OBJECTIVE:  Note: Objective measures were completed at Evaluation unless otherwise noted.  DIAGNOSTIC FINDINGS:  Brain MRI 09/20/2014 IMPRESSION: 1. No acute infarct. 2. Chronic right MCA territory infarct. 3. Subtle T2 hyperintensity in the left hippocampus and left temporal lobe cortex versus artifact. Recent seizure activity is a consideration.  COGNITION: Overall cognitive status: History of cognitive impairments - at baseline - reports quicker to anger/verbalize emotions/sometimes overstimulated with sounds and distractions   SENSATION: Light touch: WFL - LLE hypersensitive/question of allodynia  COORDINATION: Mildly dysmetric LLE  EDEMA:  None significant in BLE  MUSCLE TONE: LLE: Modifed Ashworth Scale 1+ = Slight increase in muscle tone, manifested by a catch, followed by minimal resistance throughout the remainder (less than half) of the ROM  POSTURE: rounded shoulders, flexed trunk , and weight shift right - mildly flexed trunk and right weight shift in standing and with ambulation, L shoulder more rounded than R w/ mild shoulder hike  LOWER EXTREMITY ROM:     Active  Right Eval Left Eval  Hip flexion WNL WFL  Hip extension    Hip abduction    Hip adduction    Hip internal rotation    Hip external rotation    Knee flexion    Knee extension     Ankle dorsiflexion  2+  Ankle plantarflexion    Ankle inversion    Ankle eversion     (Blank rows = not tested)  LOWER EXTREMITY MMT:    MMT Right Eval Left Eval  Hip flexion    Hip extension    Hip abduction    Hip adduction    Hip internal rotation  Hip external rotation    Knee flexion    Knee extension    Ankle dorsiflexion    Ankle plantarflexion    Ankle inversion    Ankle eversion    (Blank rows = not tested)  BED MOBILITY:  Findings: Sit to supine Complete Independence Supine to sit Complete Independence Rolling to Right Complete Independence Rolling to Left Complete Independence - needs increased time and momentum for L and R Ind w/ features of tempurpedic style bed  TRANSFERS: Sit to stand: SBA  Assistive device utilized: Single point cane     Stand to sit: SBA  Assistive device utilized: Single point cane     Chair to chair: SBA  Assistive device utilized: Single point cane       RAMP:  Not tested  CURB:  Not tested  STAIRS: Not tested GAIT: Findings: Distance walked: various clinic distances and Comments: Pt has mild right reliance w/ SPC, no foot scuff/toe catch even without AFO, shortened stride and slightly diminished flexion pattern of LLE  FUNCTIONAL TESTS:  5 times sit to stand: 18.89 sec w/ heavy right trunk shortening and RUE reliance w/ L toe down vs full weight bearing  PATIENT SURVEYS:  ABC scale: The Activities-Specific Balance Confidence (ABC) Scale TBA                                                                                                                              TREATMENT DATE: 04-11-24 -Pt ambulates x115' mod I w/ SPC for orthotist and PT to discuss ankle posture - edu to patient on anatomy and goal of protection; Time spent w/ orthotist discussing bracing options and ankle positioning - double upright vs Moore Balance brace vs prior AFO vs lateral heel wedge vs alternative flexible PF/DF bracing option; discussed  him following up with podiatrist about how orthotic fits into Moore brace and discussing measuring for this if he chooses to pursue, will try lateral heel wedge when Olivia able to drop off options -L DF slide x115' w/ SPC and SBA, improving flicks -Sitting L DF w/10 lb kettlebell DF w/ more isometric style contraction, pyramid 3x8-10 reps -6 step taps x10 RUE support > LLE step ups x10  PATIENT EDUCATION: Education details:  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 Person educated: Patient Education method: Medical Illustrator Education comprehension: verbalized understanding and needs further education  HOME EXERCISE PROGRAM: Access Code: [facility] URL: [email or web address] Date: 03/21/2024 Prepared by: Daved Bull  Exercises - Seated Toe Raise  - 1-2 x daily - 4 x weekly - 2 sets - 10 reps - 2-3 seconds hold - Ankle Inversion Eversion Towel Slide  - 1-2 x daily - 4 x weekly - 2 sets - 10 reps - Seated Ankle Circles  - 1-2 x daily - 4 x weekly - 2 sets - 10 reps - Seated Heel Raise  - 1-2 x daily - 4 x weekly - 2 sets -  10 reps - 2-3 seconds hold - Seated Ankle Inversion Eversion PROM  - 1 x daily - 7 x weekly - 2 sets - 10 reps - Staggered Sit-to-Stand  - 1 x daily - 4-5 x weekly - 2-3 sets - 10 reps - Seated Hamstring Stretch  - 1 x daily - 4-5 x weekly - 1 sets - 3-4 reps - 45 seconds hold - Hooklying Single Knee to Chest Stretch with Towel  - 1 x daily - 4-5 x weekly - 1 sets - 3-4 reps - 45 seconds hold - Seated Toe Towel Scrunches  - 1 x daily - 4 x weekly - 1-2 sets - 10 reps - Seated Arch Lifts  - 1 x daily - 4 x weekly - 1-2 sets - 10 reps  Desensitization Techniques: -Light touch/pressure:  using fingertip pressure to slowly move up small segments of the affected body part until outside the area of pain and then back to where you started -Deep pressure:  using fingertips to squeeze in small segments starting outside the  affected area, crossing over the affected area, and then to the other side of the affected area -Tapping:  fingertips tap with firm pressure over the affected area, can move around the affected area as well -Brushing/scratching:  use fingertips to brush/scratch over and around the affected area -Textures:  use soft and rough textured items like cotton balls vs wash cloths to brush or rub with light into firm pressure over and around the affected area  Repeat these 3-4x per day.   GOALS: Goals reviewed with patient? Yes  SHORT TERM GOALS: Target date: 04/08/2024  Pt will be independent and compliant with introductory strength and balance HEP in order to maintain functional progress and improve mobility. Baseline:  To be established. Goal status: INITIAL  2.  Pt will demonstrate adequate balance strategies to maintain upright with forward/overhead/and floor reaching. Baseline:  Very narrow limits of stability - several near falls related to this. Goal status: INITIAL  3.  Pt will be independent with desensitization strategies for LLE hypersensitivity management. Baseline: To be provided. Goal status: INITIAL  LONG TERM GOALS: Target date: 05/06/2024  Pt will be independent and compliant with advanced and finalized strength and balance HEP in order to maintain functional progress and improve mobility. Baseline: To be established. Goal status: INITIAL  2.  Patient will improve ABC Scale score to >/=76% in order to demonstrate decreased risk for falls and increased confidence in balance. Baseline: 66.875% (10/14) Goal status: INITIAL  3.  Patient will demonstrate floor recovery w/ no more than SBA in order to improve safety and home management. Baseline: Reports recent minA even w/ support surface Goal status: INITIAL  4.  Pt will decrease 5xSTS to </=12 seconds w/ improved midline mechanics in order to demonstrate decreased risk for falls and improved functional bilateral LE strength  and power. Baseline: 18.89 sec w/ heavy right trunk shortening and RUE reliance w/ L toe down vs full weight bearing Goal status: INITIAL  ASSESSMENT:  CLINICAL IMPRESSION: Attempted to finalize plans for orthotic adjustments to improve left ankle safety and posture.  Orthotist to bring lateral heel wedge for pt to trial again and pt to consider [place] and possible temporary air cast for home.  He continues to be challenged by DF motor coordination partially due to extension guarding of the LLE with dynamic mobility.  Pt stands to benefit from ongoing skilled PT interventions to improve motor control and gait mechanics.  Continue per POC.  OBJECTIVE IMPAIRMENTS: Abnormal gait, decreased activity tolerance, decreased balance, decreased cognition, decreased coordination, decreased endurance, decreased knowledge of use of DME, decreased strength, impaired sensation, impaired tone, improper body mechanics, and postural dysfunction.   ACTIVITY LIMITATIONS: carrying, lifting, bending, squatting, stairs, transfers, reach over head, and locomotion level  PARTICIPATION LIMITATIONS: laundry, shopping, and community activity  PERSONAL FACTORS: Age, Fitness, Past/current experiences, Time since onset of injury/illness/exacerbation, and 1-2 comorbidities: HTN, Afib are also affecting patient's functional outcome.   REHAB POTENTIAL: Good  CLINICAL DECISION MAKING: Stable/uncomplicated  EVALUATION COMPLEXITY: Moderate  PLAN:  PT FREQUENCY: 2x/week  PT DURATION: 8 weeks  PLANNED INTERVENTIONS: [id number]- PT Re-evaluation, 97750- Physical Performance Testing, 97110-Therapeutic exercises, 97530- Therapeutic activity, [id number]- Neuromuscular re-education, 97535- Self Care, [id number]- Manual therapy, [id number]- Gait training, [phone]- Orthotic Initial, [phone]- Orthotic/Prosthetic subsequent, [phone]- Aquatic Therapy, [phone]- Electrical stimulation (manual), Patient/Family education, Balance training, Stair  training, Taping, Joint mobilization, Vestibular training, and DME instructions  PLAN FOR NEXT SESSION: trial air cast again if pt agrees - may benefit for use during SLS in clinic; Lt hip strengthening  Gait training w/ SPC.  Floor recovery - incorporate tight space setup - repeat w/ full transition to bottom and incorporate LLE use to recover to standing.  LLE NMR/strength/reaction time.  Expand strength and balance HEP.  Core strength - work mat table level on tasks to improve floor recovery from stomach and supine.  Reaching floor/forward/overhead.  Ascent - ramp and stairs.  Perturbations/resisted gait/farmer's carry.  Lateral stepping and left knee flexion w/ swing through; Repeat LE stretch prn.    Pt felt challenged w/: Stairs/step taps/up; ramp Standing 10lb kettlebell hip flexion LLE (isometric DF) Walking beanbag DF SLS taps to top step on L leg  Olivia to drop lateral wedge options off.  Pt declines offer of aquatic therapy at evaluation.  Daved KATHEE Bull, PT, DPT 04/11/2024, 3:32 PM

## 2024-04-13 DIAGNOSIS — I6930 Unspecified sequelae of cerebral infarction: Secondary | ICD-10-CM | POA: Diagnosis not present

## 2024-04-13 DIAGNOSIS — E1169 Type 2 diabetes mellitus with other specified complication: Secondary | ICD-10-CM | POA: Diagnosis not present

## 2024-04-13 DIAGNOSIS — Z23 Encounter for immunization: Secondary | ICD-10-CM | POA: Diagnosis not present

## 2024-04-13 DIAGNOSIS — G40909 Epilepsy, unspecified, not intractable, without status epilepticus: Secondary | ICD-10-CM | POA: Diagnosis not present

## 2024-04-13 DIAGNOSIS — Z0000 Encounter for general adult medical examination without abnormal findings: Secondary | ICD-10-CM | POA: Diagnosis not present

## 2024-04-13 DIAGNOSIS — Z136 Encounter for screening for cardiovascular disorders: Secondary | ICD-10-CM | POA: Diagnosis not present

## 2024-04-13 DIAGNOSIS — D6869 Other thrombophilia: Secondary | ICD-10-CM | POA: Diagnosis not present

## 2024-04-13 DIAGNOSIS — I10 Essential (primary) hypertension: Secondary | ICD-10-CM | POA: Diagnosis not present

## 2024-04-13 DIAGNOSIS — I4891 Unspecified atrial fibrillation: Secondary | ICD-10-CM | POA: Diagnosis not present

## 2024-04-14 ENCOUNTER — Ambulatory Visit: Admitting: Occupational Therapy

## 2024-04-14 ENCOUNTER — Encounter: Payer: Self-pay | Admitting: Physical Therapy

## 2024-04-14 ENCOUNTER — Ambulatory Visit: Admitting: Physical Therapy

## 2024-04-14 DIAGNOSIS — R2681 Unsteadiness on feet: Secondary | ICD-10-CM | POA: Diagnosis not present

## 2024-04-14 DIAGNOSIS — R209 Unspecified disturbances of skin sensation: Secondary | ICD-10-CM | POA: Diagnosis not present

## 2024-04-14 DIAGNOSIS — R208 Other disturbances of skin sensation: Secondary | ICD-10-CM

## 2024-04-14 DIAGNOSIS — R278 Other lack of coordination: Secondary | ICD-10-CM

## 2024-04-14 DIAGNOSIS — M6281 Muscle weakness (generalized): Secondary | ICD-10-CM

## 2024-04-14 DIAGNOSIS — R29818 Other symptoms and signs involving the nervous system: Secondary | ICD-10-CM

## 2024-04-14 DIAGNOSIS — I69354 Hemiplegia and hemiparesis following cerebral infarction affecting left non-dominant side: Secondary | ICD-10-CM

## 2024-04-14 DIAGNOSIS — R29898 Other symptoms and signs involving the musculoskeletal system: Secondary | ICD-10-CM

## 2024-04-14 DIAGNOSIS — R41844 Frontal lobe and executive function deficit: Secondary | ICD-10-CM

## 2024-04-14 NOTE — Therapy (Signed)
 OUTPATIENT PHYSICAL THERAPY NEURO TREATMENT   Patient Name: Donald [name] MRN: [id number] DOB:Nov 08, 1945, 78 y.o., male Today's Date: 04/14/2024   PCP: Donald Righter, MD REFERRING PROVIDER: Sherryl Bouchard, NP  END OF SESSION:  PT End of Session - 04/14/24 1402     Visit Number 11    Number of Visits 17   16 + eval   Date for Recertification  05/13/24   pushed out due to scheduling delay   Authorization Type Medicare A&B w/ [employer] supplement    Progress Note Due on Visit 10    PT Start Time 1400    PT Stop Time 1448    PT Time Calculation (min) 48 min    Equipment Utilized During Treatment Gait belt    Activity Tolerance Patient tolerated treatment well    Behavior During Therapy WFL for tasks assessed/performed          Past Medical History:  Diagnosis Date   Diabetes mellitus without complication (HCC)    Dysrhythmia    Hyperlipemia    Hypertension    large right middle cerebral artery infarct, 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 s/p IV tPA, b. source unknown, c. loop recorder placed   Snoring 07/03/2015   Past Surgical History:  Procedure Laterality Date   LOOP RECORDER IMPLANT  06/07/2013   MDT LinQ implanted by Dr [name] for cryptogenic stroke   LOOP RECORDER IMPLANT N/A 06/07/2013   Procedure: LOOP RECORDER IMPLANT;  Surgeon: Donald JONETTA Kelsie, MD;  Location: [facility] CATH LAB;  Service: Cardiovascular;  Laterality: N/A;   TEE WITHOUT CARDIOVERSION N/A 06/07/2013   Procedure: TRANSESOPHAGEAL ECHOCARDIOGRAM (TEE);  Surgeon: Donald VEAR Moose, MD;  Location: [facility] ENDOSCOPY;  Service: Cardiovascular;  Laterality: N/A;   TONSILLECTOMY     TRANSANAL HEMORRHOIDAL DEARTERIALIZATION N/A 02/18/2023   Procedure: TRANSANAL HEMORRHOIDAL DEARTERIALIZATION;  Surgeon: Donald Hila, MD;  Location: [facility] ORS;  Service: General;  Laterality: N/A;   UMBILICAL HERNIA REPAIR     Patient Active Problem List   Diagnosis Date Noted   Permanent atrial fibrillation (HCC) 05/17/2019    Acquired thrombophilia 05/17/2019   Snoring 07/03/2015   Persistent atrial fibrillation (HCC) 04/16/2015   Hypokalemia 09/20/2014   HTN (hypertension) 09/19/2014   HLD (hyperlipidemia) 09/19/2014   Diabetes mellitus without complication (HCC) 09/19/2014   Jerking 09/19/2014   Alterations of sensations, late effect of cerebrovascular disease 11/07/2013   Disturbances of vision, late effect of cerebrovascular disease 11/07/2013   Sinus bradycardia 08/08/2013   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HCC) 07/26/2013   Left-sided neglect 07/26/2013   Small vessel disease 06/07/2013   Obesity, unspecified 06/07/2013   Cerebral infarction (HCC) 06/07/2013   large right middle cerebral artery infarct, 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   Hypertension 06/03/2013   Diabetes (HCC) 06/03/2013    ONSET DATE: 06/03/2013 (CVA)  REFERRING DIAG: P30.645 (ICD-10-CM) - Hemiparesis affecting left side as late effect of cerebrovascular accident (HCC)  THERAPY DIAG:  Muscle weakness (generalized)  Other lack of coordination  Hemiplegia and hemiparesis following cerebral infarction affecting left non-dominant side (HCC)  Other symptoms and signs involving the musculoskeletal system  Other disturbances of skin sensation  Other symptoms and signs involving the nervous system  Unsteadiness on feet  Rationale for Evaluation and Treatment: Rehabilitation  SUBJECTIVE:  SUBJECTIVE STATEMENT: Donald [name]  Pt denies falls or acute status changes.  He denies pain.  He had his PCP annual a couple days ago and reports he has lost 7 pounds and is awaiting A1c results. Pt accompanied by: self (Wife - waiting in car)  PERTINENT HISTORY: R MCA infarct 06/03/2013 S/p tPA, HTN, DM2, Afib, had 1 seizure in 2017  PAIN:  Are you having  pain? No  PRECAUTIONS: Fall and Other: loop recorder  RED FLAGS: None   WEIGHT BEARING RESTRICTIONS: No  FALLS: Has patient fallen in last 6 months? Yes. Number of falls 3  LIVING ENVIRONMENT: Lives with: lives with their spouse Lives in: House/apartment Stairs: No Has following equipment at home: Single point cane  PLOF: Requires assistive device for independence  PATIENT GOALS: To work on reaction time and getting off the floor  OBJECTIVE:  Note: Objective measures were completed at Evaluation unless otherwise noted.  DIAGNOSTIC FINDINGS:  Brain MRI 09/20/2014 IMPRESSION: 1. No acute infarct. 2. Chronic right MCA territory infarct. 3. Subtle T2 hyperintensity in the left hippocampus and left temporal lobe cortex versus artifact. Recent seizure activity is a consideration.  COGNITION: Overall cognitive status: History of cognitive impairments - at baseline - reports quicker to anger/verbalize emotions/sometimes overstimulated with sounds and distractions   SENSATION: Light touch: WFL - LLE hypersensitive/question of allodynia  COORDINATION: Mildly dysmetric LLE  EDEMA:  None significant in BLE  MUSCLE TONE: LLE: Modifed Ashworth Scale 1+ = Slight increase in muscle tone, manifested by a catch, followed by minimal resistance throughout the remainder (less than half) of the ROM  POSTURE: rounded shoulders, flexed trunk , and weight shift right - mildly flexed trunk and right weight shift in standing and with ambulation, L shoulder more rounded than R w/ mild shoulder hike  LOWER EXTREMITY ROM:     Active  Right Eval Left Eval  Hip flexion WNL WFL  Hip extension    Hip abduction    Hip adduction    Hip internal rotation    Hip external rotation    Knee flexion    Knee extension    Ankle dorsiflexion  2+  Ankle plantarflexion    Ankle inversion    Ankle eversion     (Blank rows = not tested)  LOWER EXTREMITY MMT:    MMT Right Eval Left Eval  Hip  flexion    Hip extension    Hip abduction    Hip adduction    Hip internal rotation    Hip external rotation    Knee flexion    Knee extension    Ankle dorsiflexion    Ankle plantarflexion    Ankle inversion    Ankle eversion    (Blank rows = not tested)  BED MOBILITY:  Findings: Sit to supine Complete Independence Supine to sit Complete Independence Rolling to Right Complete Independence Rolling to Left Complete Independence - needs increased time and momentum for L and R Ind w/ features of tempurpedic style bed  TRANSFERS: Sit to stand: SBA  Assistive device utilized: Single point cane     Stand to sit: SBA  Assistive device utilized: Single point cane     Chair to chair: SBA  Assistive device utilized: Single point cane       RAMP:  Not tested  CURB:  Not tested  STAIRS: Not tested GAIT: Findings: Distance walked: various clinic distances and Comments: Pt has mild right reliance w/ SPC, no foot scuff/toe catch even without AFO, shortened  stride and slightly diminished flexion pattern of LLE  FUNCTIONAL TESTS:  5 times sit to stand: 18.89 sec w/ heavy right trunk shortening and RUE reliance w/ L toe down vs full weight bearing  PATIENT SURVEYS:  ABC scale: The Activities-Specific Balance Confidence (ABC) Scale TBA                                                                                                                              TREATMENT DATE: 04-14-24 Placed 6/16 lateral heel wedge in left shoe: -Pt ambulates x115' mod I w/ [facility] for assessment of heel wedge w/ some improvement into pronation during toe off, but pt still pushes to supination > trialed outside in grass and over unlevel sidewalk w/ no added control, discussed Moore Balance Brace for unlevel surfaces and long distance ankle control. -L DF slide x115' w/ SPC and SBA, improving flicks - more consistent distance of flicks -pt requested to add additional lateral wedge (1/4); initial wedge did  slide so adjusted back to lateral aspect of shoe before adding.  Pt also has heel buildup in shoe to improve general fit of the shoe as placed by podiatrist as he had to size initial shoe up.  Advised that wedges may need to be glued in to prevent sliding and PT does not have materials to do so.  Decided to pursue MBB as pt does not do well with active control with lateral wedge to maintain position of wedge and ankle position often walking on lateral edge of foot without noticing.  Removed wedges (provided sample to take to podiatrist for further discussion).  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  Heel wedge edu (see above).  Don't rest ankle in inverted positioning for prolonged periods. Person educated: Patient Education method: Medical Illustrator Education comprehension: verbalized understanding and needs further education  HOME EXERCISE PROGRAM: Access Code: [facility] URL: [email or web address] Date: 03/21/2024 Prepared by: Daved Bull  Exercises - Seated Toe Raise  - 1-2 x daily - 4 x weekly - 2 sets - 10 reps - 2-3 seconds hold - Ankle Inversion Eversion Towel Slide  - 1-2 x daily - 4 x weekly - 2 sets - 10 reps - Seated Ankle Circles  - 1-2 x daily - 4 x weekly - 2 sets - 10 reps - Seated Heel Raise  - 1-2 x daily - 4 x weekly - 2 sets - 10 reps - 2-3 seconds hold - Seated Ankle Inversion Eversion PROM  - 1 x daily - 7 x weekly - 2 sets - 10 reps - Staggered Sit-to-Stand  - 1 x daily - 4-5 x weekly - 2-3 sets - 10 reps - Seated Hamstring Stretch  - 1 x daily - 4-5 x weekly - 1 sets - 3-4 reps - 45 seconds hold - Hooklying Single Knee to Chest Stretch  with Towel  - 1 x daily - 4-5 x weekly - 1 sets - 3-4 reps - 45 seconds hold - Seated Toe Towel Scrunches  - 1 x daily - 4 x weekly - 1-2 sets - 10 reps - Seated Arch Lifts  - 1 x daily - 4 x  weekly - 1-2 sets - 10 reps  Desensitization Techniques: -Light touch/pressure:  using fingertip pressure to slowly move up small segments of the affected body part until outside the area of pain and then back to where you started -Deep pressure:  using fingertips to squeeze in small segments starting outside the affected area, crossing over the affected area, and then to the other side of the affected area -Tapping:  fingertips tap with firm pressure over the affected area, can move around the affected area as well -Brushing/scratching:  use fingertips to brush/scratch over and around the affected area -Textures:  use soft and rough textured items like cotton balls vs wash cloths to brush or rub with light into firm pressure over and around the affected area  Repeat these 3-4x per day.   GOALS: Goals reviewed with patient? Yes  SHORT TERM GOALS: Target date: 04/08/2024  Pt will be independent and compliant with introductory strength and balance HEP in order to maintain functional progress and improve mobility. Baseline:  To be established. Goal status: INITIAL  2.  Pt will demonstrate adequate balance strategies to maintain upright with forward/overhead/and floor reaching. Baseline:  Very narrow limits of stability - several near falls related to this. Goal status: INITIAL  3.  Pt will be independent with desensitization strategies for LLE hypersensitivity management. Baseline: To be provided. Goal status: INITIAL  LONG TERM GOALS: Target date: 05/06/2024  Pt will be independent and compliant with advanced and finalized strength and balance HEP in order to maintain functional progress and improve mobility. Baseline: To be established. Goal status: INITIAL  2.  Patient will improve ABC Scale score to >/=76% in order to demonstrate decreased risk for falls and increased confidence in balance. Baseline: 66.875% (10/14) Goal status: INITIAL  3.  Patient will demonstrate floor  recovery w/ no more than SBA in order to improve safety and home management. Baseline: Reports recent minA even w/ support surface Goal status: INITIAL  4.  Pt will decrease 5xSTS to </=12 seconds w/ improved midline mechanics in order to demonstrate decreased risk for falls and improved functional bilateral LE strength and power. Baseline: 18.89 sec w/ heavy right trunk shortening and RUE reliance w/ L toe down vs full weight bearing Goal status: INITIAL  ASSESSMENT:  CLINICAL IMPRESSION: Lateral heel wedge had potential to be of benefit to patient today if 10/16 height used and glued in, but pt does not have volitional strength to maintain distance w/ active engagement w/ wedge to roll into pronation repetitively.  Due to this he and PT feel Moore Balance Brace may offer most impactful variable benefit so he is to contact podiatrist to initiate measurements for this.  He also has sample of wedge to discuss if coupling of options needed for most optimal static positioning of ankle.  He continues to benefit from skilled PT to address functional weakness and optimize safest upright mobility.  Continue per POC.  OBJECTIVE IMPAIRMENTS: Abnormal gait, decreased activity tolerance, decreased balance, decreased cognition, decreased coordination, decreased endurance, decreased knowledge of use of DME, decreased strength, impaired sensation, impaired tone, improper body mechanics, and postural dysfunction.   ACTIVITY LIMITATIONS: carrying, lifting, bending, squatting, stairs, transfers,  reach over head, and locomotion level  PARTICIPATION LIMITATIONS: laundry, shopping, and community activity  PERSONAL FACTORS: Age, Fitness, Past/current experiences, Time since onset of injury/illness/exacerbation, and 1-2 comorbidities: HTN, Afib are also affecting patient's functional outcome.   REHAB POTENTIAL: Good  CLINICAL DECISION MAKING: Stable/uncomplicated  EVALUATION COMPLEXITY: Moderate  PLAN:  PT  FREQUENCY: 2x/week  PT DURATION: 8 weeks  PLANNED INTERVENTIONS: [id number]- PT Re-evaluation, 97750- Physical Performance Testing, 97110-Therapeutic exercises, 97530- Therapeutic activity, [id number]- Neuromuscular re-education, 97535- Self Care, [id number]- Manual therapy, [id number]- Gait training, [phone]- Orthotic Initial, [phone]- Orthotic/Prosthetic subsequent, [phone]- Aquatic Therapy, [phone]- Electrical stimulation (manual), Patient/Family education, Balance training, Stair training, Taping, Joint mobilization, Vestibular training, and DME instructions  PLAN FOR NEXT SESSION: Address orthotic if pt requests!   Lt hip strengthening; Gait training w/ SPC.  Floor recovery - incorporate tight space setup - repeat w/ full transition to bottom and incorporate LLE use to recover to standing.  LLE NMR/strength/reaction time.  Expand strength and balance HEP.  Core strength - work mat table level on tasks to improve floor recovery from stomach and supine.  Reaching floor/forward/overhead.  Ascent - ramp and stairs.  Perturbations/resisted gait/farmer's carry.  Lateral stepping and left knee flexion w/ swing through; Repeat LE stretch prn.    Pt felt challenged w/: Stairs/step taps/up; ramp Standing 10lb kettlebell hip flexion LLE (isometric DF) Walking beanbag DF SLS taps to top step on L leg  Pt declines offer of aquatic therapy at evaluation.  Daved KATHEE Bull, PT, DPT 04/14/2024, 3:06 PM

## 2024-04-14 NOTE — Therapy (Addendum)
 OUTPATIENT OCCUPATIONAL THERAPY NEURO TREATMENT  Patient Name: Donald [name] MRN: [id number] DOB:08/29/1945, 78 y.o., male Today's Date: 04/14/2024  PCP: Kip Righter, MD REFERRING PROVIDER: Sherryl Bouchard, NP  END OF SESSION:  OT End of Session - 04/14/24 1441     Visit Number 4    Number of Visits 17    Date for Recertification  06/04/24    Authorization Type MCR A&B/[employer]    Progress Note Due on Visit 10    OT Start Time 1445    OT Stop Time 1534    OT Time Calculation (min) 49 min    Equipment Utilized During Treatment Golf Solitaire    Activity Tolerance Patient tolerated treatment well    Behavior During Therapy WFL for tasks assessed/performed           Past Medical History:  Diagnosis Date   Diabetes mellitus without complication (HCC)    Dysrhythmia    Hyperlipemia    Hypertension    large right middle cerebral artery infarct, 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 s/p IV tPA, b. source unknown, c. loop recorder placed   Snoring 07/03/2015   Past Surgical History:  Procedure Laterality Date   LOOP RECORDER IMPLANT  06/07/2013   MDT LinQ implanted by Dr Kelsie for cryptogenic stroke   LOOP RECORDER IMPLANT N/A 06/07/2013   Procedure: LOOP RECORDER IMPLANT;  Surgeon: Lynwood JONETTA Kelsie, MD;  Location: [facility]ATH [facility];  Service: Cardiovascular;  Laterality: N/A;   TEE WITHOUT CARDIOVERSION N/A 06/07/2013   Procedure: TRANSESOPHAGEAL ECHOCARDIOGRAM (TEE);  Surgeon: Leim VEAR Moose, MD;  Location: [facility];  Service: Cardiovascular;  Laterality: N/A;   TONSILLECTOMY     TRANSANAL HEMORRHOIDAL DEARTERIALIZATION N/A 02/18/2023   Procedure: TRANSANAL HEMORRHOIDAL DEARTERIALIZATION;  Surgeon: Debby Hila, MD;  Location: [facility] ORS;  Service: General;  Laterality: N/A;   UMBILICAL HERNIA REPAIR     Patient Active Problem List   Diagnosis Date Noted   Permanent atrial fibrillation (HCC) 05/17/2019   Acquired thrombophilia 05/17/2019   Snoring 07/03/2015   Persistent atrial  fibrillation (HCC) 04/16/2015   Hypokalemia 09/20/2014   HTN (hypertension) 09/19/2014   HLD (hyperlipidemia) 09/19/2014   Diabetes mellitus without complication (HCC) 09/19/2014   Jerking 09/19/2014   Alterations of sensations, late effect of cerebrovascular disease 11/07/2013   Disturbances of vision, late effect of cerebrovascular disease 11/07/2013   Sinus bradycardia 08/08/2013   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HCC) 07/26/2013   Left-sided neglect 07/26/2013   Small vessel disease 06/07/2013   Obesity, unspecified 06/07/2013   Cerebral infarction (HCC) 06/07/2013   large right middle cerebral artery infarct, 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   Hypertension 06/03/2013   Diabetes (HCC) 06/03/2013    ONSET DATE: 06/03/2013 (CVA), 03/15/2024 referral date  REFERRING DIAG: P30.645 (ICD-10-CM) - Hemiplegia and hemiparesis following cerebral infarction affecting left non-dominant side (HCC)  THERAPY DIAG:  Muscle weakness (generalized)  Other symptoms and signs involving the nervous system  Other lack of coordination  Frontal lobe and executive function deficit  Other disturbances of skin sensation  Hemiplegia and hemiparesis following cerebral infarction affecting left non-dominant side (HCC)  Rationale for Evaluation and Treatment: Rehabilitation  SUBJECTIVE:   SUBJECTIVE STATEMENT: Pt reports all is going well. He wants to be able to play his guitar but hasn't been able to since his CVA 10 years ago due to sensitity at fingertips. In addition, pt reports that he has been practicing some desensitization techniques at home by rubbing his fingertips on his pants. OT encouraged pt to continue  practicing desensitization strategies multiple times a day for notable improvements.   Pt accompanied by: self  PERTINENT HISTORY: R MCA infarct 06/03/2013 S/p tPA, HTN, DM2, Afib, had 1 seizure in 2017   DIAGNOSTIC FINDINGS:  Brain MRI 09/20/2014 IMPRESSION: 1. No acute infarct. 2.  Chronic right MCA territory infarct. 3. Subtle T2 hyperintensity in the left hippocampus and left temporal lobe cortex versus artifact. Recent seizure activity is a consideration  PRECAUTIONS: Fall and Other: loop recorder, L sided weakness  WEIGHT BEARING RESTRICTIONS: No  PAIN:  Are you having pain? No  FALLS: Has patient fallen in last 6 months? Yes. Number of falls 4 in the last year  LIVING ENVIRONMENT: Lives with: lives with their spouse Lives in: House/apartment 1 level Stairs: No Has following equipment at home: Single point cane, shower chair/bench with back, walk in shower  PLOF: Requires assistive device for independence  PATIENT GOALS: I'd like to be able to use my left hand more functionally. I want to improve my coordination and reaction time.  OBJECTIVE:  Note: Objective measures were completed at Evaluation unless otherwise noted.  HAND DOMINANCE: Right  ADLs: Overall ADLs: Independent  Equipment: Shower seat with back and Walk in shower  IADLs: Cooks and cleans bathrooms and kitchen Shopping:  Light housekeeping: Independent Meal Prep: Independent Community mobility: Wife completes  Medication management: Independent Landscape architect: Independent Handwriting: did not assess d/t nondominant L hand being affected  MOBILITY STATUS: ambulates with SPC  POSTURE COMMENTS:  rounded shoulders, flexed trunk , and weight shift right - mildly flexed trunk and right weight shift in standing and with ambulation, L shoulder more rounded than R w/ mild shoulder hike   ACTIVITY TOLERANCE: Activity tolerance: no changes  FUNCTIONAL OUTCOME MEASURES: PSFS:   UPPER EXTREMITY ROM:  WNL  Active ROM Right eval Left eval  Shoulder flexion    Shoulder abduction    Shoulder adduction    Shoulder extension    Shoulder internal rotation    Shoulder external rotation    Elbow flexion    Elbow extension    Wrist flexion    Wrist extension    Wrist ulnar  deviation    Wrist radial deviation    Wrist pronation    Wrist supination    (Blank rows = not tested)  UPPER EXTREMITY MMT:   4-/5 shoulder   MMT Right eval Left eval  Shoulder flexion    Shoulder abduction    Shoulder adduction    Shoulder extension    Shoulder internal rotation    Shoulder external rotation    Middle trapezius    Lower trapezius    Elbow flexion    Elbow extension    Wrist flexion    Wrist extension    Wrist ulnar deviation    Wrist radial deviation    Wrist pronation    Wrist supination    (Blank rows = not tested)  HAND FUNCTION: Grip strength: Right: 73.1 lbs; Left: 58.2 lbs  COORDINATION: 9 Hole Peg test: Right: 26.32 sec; Left: 65.08 sec Box and Blocks:  Right 49 blocks, Left 29 blocks  SENSATION: Reports hypersensitivity in fingertips  EDEMA: Pt reports hx of edema but it is rare now.  MUSCLE TONE: LUE: Modifed Ashworth Scale 1 = Slight increase in muscle tone, manifested by a catch and release or by minimal resistance at the end of the range of motion when the affected part(s) is moved in flexion or extension  COGNITION: Overall cognitive status: Within  functional limits for tasks assessed  VISION: Subjective report: Pt reports having small cataracts but it is not affecting vision at this time Baseline vision: Wears glasses all the time Visual history: cataracts well kept  VISION ASSESSMENT: To be further assessed in functional context  PERCEPTION: WFL  PRAXIS: WFL  OBSERVATIONS: Impaired FM coordination, grip strength, strength in L hand and shoulder, hypersensitivity in fingertips of L hand                                                                                                                           TREATMENT:  - Therapeutic activities completed for duration as noted below including:  At the start of the session, pt was encouraged to participate in the chain game to encourage strengthening and fine motor  coordination by stretching rubber bands and manipulating game pieces. However, pt became frustrated when unable to place the pieces in the designated slot before starting the game. Therefore, instead of playing the initial game, pt was encouraged to instead pick up and manipulate game pieces with L UE, holding as many as he could in his hand before placing them into a bag.  In addition, OT educated pt on table top play of Golf Solitaire for LUE to address fine motor coordination, gross motor coordination, upper extremity range of motion, scanning and locating of items, processing, and motor planning. Pt demonstrate difficulty with processing game rules and therefore was provided with moderate cues for proper play.    PATIENT EDUCATION: Education details: Manufacturing systems engineer Person educated: Patient Education method: Solicitor, Actor cues, Verbal cues, and Handouts Education comprehension: verbalized understanding, returned demonstration, verbal cues required, tactile cues required, and needs further education  HOME EXERCISE PROGRAM: 04/04/24: Desensitization 04/07/24: Putty, coordination 04/14/24: Golf solitaire   GOALS: Goals reviewed with patient? Yes  SHORT TERM GOALS: Target date: 05/04/24  Pt will be independent with HEP completion for coordination and strengthening Baseline: New to OP OT Goal status: IN PROGRESS  2.  Pt will be independent with desensitization techniques Baseline: Educated  Goal status: IN PROGRESS  3.  Pt will increase L shoulder strength to 4/5 Baseline: 4-/5  Goal status:  IN PROGRESS  4.  Pt will demonstrate improved coordination in L hand by a score of 34 blocks in Box and Blocks Test Baseline: Right 49 blocks, Left 29 blocks Goal status:  IN PROGRESS  LONG TERM GOALS: Target date: 06/04/24  Pt will demonstrate an increase of at least 2.0 score of average PSFS score or a 2 point increase in individual activity score in PSFS Baseline:  Goal  status:  IN PROGRESS  2.  Pt will demonstrate improved L hand FM coordination by a score of 60 seconds or less in 9HPT Baseline: Right: 26.32 sec; Left: 65.08 sec Goal status:  IN PROGRESS  3.  Pt will demonstrate improved grip strength in L hand by scoring at least 65 pounds Baseline: Right: 73.1 lbs; Left: 58.2 lbs Goal  status:  IN PROGRESS  4.  Pt will demonstrate improved L coordination by a score of at least 39 blocks on Box and Blocks test Baseline: 29 blocks Goal status:  IN PROGRESS   ASSESSMENT:  CLINICAL IMPRESSION: Patient presents with L sided weakness and impaired coordination secondary to CVA. He demonstrates fair rehab potential give chronicity of CVA. However, pt is motivated for all given therapy task and compliant with HEP. He will continue to benefit from skilled outpatient OT to improve functional use of L UE for ADLs and IADLs.   PERFORMANCE DEFICITS: in functional skills including IADLs, coordination, dexterity, sensation, strength, Fine motor control, Gross motor control, decreased knowledge of precautions, and UE functional use, cognitive skills including safety awareness, and psychosocial skills including coping strategies and environmental adaptation.   IMPAIRMENTS: are limiting patient from IADLs, work, and leisure.   CO-MORBIDITIES: may have co-morbidities  that affects occupational performance. Patient will benefit from skilled OT to address above impairments and improve overall function  REHAB POTENTIAL: Good  PLAN:  OT FREQUENCY: 2x/week  OT DURATION: 8 weeks  PLANNED INTERVENTIONS: 97168 OT Re-evaluation, 97535 self care/ADL training, 02889 therapeutic exercise, 97530 therapeutic activity, 97112 neuromuscular re-education, passive range of motion, psychosocial skills training, coping strategies training, patient/family education, and DME and/or AE instructions  RECOMMENDED OTHER SERVICES: none at this time  CONSULTED AND AGREED WITH PLAN OF CARE:  Patient  PLAN FOR NEXT SESSION:  Coordination activities- gross motor activities  Blazepods Strengthening activities Update L shoulder HEP  Edwards Mckelvie, Student-OT 04/14/2024, 3:53 PM

## 2024-04-18 ENCOUNTER — Encounter: Payer: Self-pay | Admitting: Physical Therapy

## 2024-04-18 ENCOUNTER — Ambulatory Visit

## 2024-04-18 ENCOUNTER — Ambulatory Visit: Admitting: Physical Therapy

## 2024-04-18 DIAGNOSIS — E1169 Type 2 diabetes mellitus with other specified complication: Secondary | ICD-10-CM | POA: Diagnosis not present

## 2024-04-18 DIAGNOSIS — R208 Other disturbances of skin sensation: Secondary | ICD-10-CM

## 2024-04-18 DIAGNOSIS — R2681 Unsteadiness on feet: Secondary | ICD-10-CM | POA: Diagnosis not present

## 2024-04-18 DIAGNOSIS — R278 Other lack of coordination: Secondary | ICD-10-CM

## 2024-04-18 DIAGNOSIS — Z0000 Encounter for general adult medical examination without abnormal findings: Secondary | ICD-10-CM | POA: Diagnosis not present

## 2024-04-18 DIAGNOSIS — R29898 Other symptoms and signs involving the musculoskeletal system: Secondary | ICD-10-CM

## 2024-04-18 DIAGNOSIS — I69354 Hemiplegia and hemiparesis following cerebral infarction affecting left non-dominant side: Secondary | ICD-10-CM

## 2024-04-18 DIAGNOSIS — R29818 Other symptoms and signs involving the nervous system: Secondary | ICD-10-CM

## 2024-04-18 DIAGNOSIS — R209 Unspecified disturbances of skin sensation: Secondary | ICD-10-CM

## 2024-04-18 DIAGNOSIS — M6281 Muscle weakness (generalized): Secondary | ICD-10-CM

## 2024-04-18 NOTE — Therapy (Signed)
 OUTPATIENT PHYSICAL THERAPY NEURO TREATMENT   Patient Name: Donald [name] MRN: [id number] DOB:1946-05-02, 78 y.o., male Today's Date: 04/18/2024   PCP: Kip Righter, MD REFERRING PROVIDER: Sherryl Bouchard, NP  END OF SESSION:  PT End of Session - 04/18/24 1450     Visit Number 12    Number of Visits 17   16 + eval   Date for Recertification  05/13/24   pushed out due to scheduling delay   Authorization Type Medicare A&B w/ [employer] supplement    Progress Note Due on Visit 10    PT Start Time 1449    PT Stop Time 1531    PT Time Calculation (min) 42 min    Equipment Utilized During Treatment Gait belt    Activity Tolerance Patient tolerated treatment well    Behavior During Therapy WFL for tasks assessed/performed          Past Medical History:  Diagnosis Date   Diabetes mellitus without complication (HCC)    Dysrhythmia    Hyperlipemia    Hypertension    large right middle cerebral artery infarct, 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 s/p IV tPA, b. source unknown, c. loop recorder placed   Snoring 07/03/2015   Past Surgical History:  Procedure Laterality Date   LOOP RECORDER IMPLANT  06/07/2013   MDT LinQ implanted by Dr Kelsie for cryptogenic stroke   LOOP RECORDER IMPLANT N/A 06/07/2013   Procedure: LOOP RECORDER IMPLANT;  Surgeon: Lynwood JONETTA Kelsie, MD;  Location: [facility]ATH [facility];  Service: Cardiovascular;  Laterality: N/A;   TEE WITHOUT CARDIOVERSION N/A 06/07/2013   Procedure: TRANSESOPHAGEAL ECHOCARDIOGRAM (TEE);  Surgeon: Leim VEAR Moose, MD;  Location: [facility] ENDOSCOPY;  Service: Cardiovascular;  Laterality: N/A;   TONSILLECTOMY     TRANSANAL HEMORRHOIDAL DEARTERIALIZATION N/A 02/18/2023   Procedure: TRANSANAL HEMORRHOIDAL DEARTERIALIZATION;  Surgeon: Debby Hila, MD;  Location: [facility] ORS;  Service: General;  Laterality: N/A;   UMBILICAL HERNIA REPAIR     Patient Active Problem List   Diagnosis Date Noted   Permanent atrial fibrillation (HCC) 05/17/2019    Acquired thrombophilia 05/17/2019   Snoring 07/03/2015   Persistent atrial fibrillation (HCC) 04/16/2015   Hypokalemia 09/20/2014   HTN (hypertension) 09/19/2014   HLD (hyperlipidemia) 09/19/2014   Diabetes mellitus without complication (HCC) 09/19/2014   Jerking 09/19/2014   Alterations of sensations, late effect of cerebrovascular disease 11/07/2013   Disturbances of vision, late effect of cerebrovascular disease 11/07/2013   Sinus bradycardia 08/08/2013   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HCC) 07/26/2013   Left-sided neglect 07/26/2013   Small vessel disease 06/07/2013   Obesity, unspecified 06/07/2013   Cerebral infarction (HCC) 06/07/2013   large right middle cerebral artery infarct, 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   Hypertension 06/03/2013   Diabetes (HCC) 06/03/2013    ONSET DATE: 06/03/2013 (CVA)  REFERRING DIAG: P30.645 (ICD-10-CM) - Hemiparesis affecting left side as late effect of cerebrovascular accident (HCC)  THERAPY DIAG:  Muscle weakness (generalized)  Other symptoms and signs involving the nervous system  Other lack of coordination  Other disturbances of skin sensation  Hemiplegia and hemiparesis following cerebral infarction affecting left non-dominant side (HCC)  Other symptoms and signs involving the musculoskeletal system  Unsteadiness on feet  Unspecified disturbances of skin sensation  Rationale for Evaluation and Treatment: Rehabilitation  SUBJECTIVE:  SUBJECTIVE STATEMENT: Donald [name]  Pt denies falls or acute status changes.  He denies pain.  He states he feels fine today. Pt accompanied by: self (Wife - waiting in car)  PERTINENT HISTORY: R MCA infarct 06/03/2013 S/p tPA, HTN, DM2, Afib, had 1 seizure in 2017  PAIN:  Are you having pain? No  PRECAUTIONS: Fall  and Other: loop recorder  RED FLAGS: None   WEIGHT BEARING RESTRICTIONS: No  FALLS: Has patient fallen in last 6 months? Yes. Number of falls 3  LIVING ENVIRONMENT: Lives with: lives with their spouse Lives in: House/apartment Stairs: No Has following equipment at home: Single point cane  PLOF: Requires assistive device for independence  PATIENT GOALS: To work on reaction time and getting off the floor  OBJECTIVE:  Note: Objective measures were completed at Evaluation unless otherwise noted.  DIAGNOSTIC FINDINGS:  Brain MRI 09/20/2014 IMPRESSION: 1. No acute infarct. 2. Chronic right MCA territory infarct. 3. Subtle T2 hyperintensity in the left hippocampus and left temporal lobe cortex versus artifact. Recent seizure activity is a consideration.  COGNITION: Overall cognitive status: History of cognitive impairments - at baseline - reports quicker to anger/verbalize emotions/sometimes overstimulated with sounds and distractions   SENSATION: Light touch: WFL - LLE hypersensitive/question of allodynia  COORDINATION: Mildly dysmetric LLE  EDEMA:  None significant in BLE  MUSCLE TONE: LLE: Modifed Ashworth Scale 1+ = Slight increase in muscle tone, manifested by a catch, followed by minimal resistance throughout the remainder (less than half) of the ROM  POSTURE: rounded shoulders, flexed trunk , and weight shift right - mildly flexed trunk and right weight shift in standing and with ambulation, L shoulder more rounded than R w/ mild shoulder hike  LOWER EXTREMITY ROM:     Active  Right Eval Left Eval  Hip flexion WNL WFL  Hip extension    Hip abduction    Hip adduction    Hip internal rotation    Hip external rotation    Knee flexion    Knee extension    Ankle dorsiflexion  2+  Ankle plantarflexion    Ankle inversion    Ankle eversion     (Blank rows = not tested)  LOWER EXTREMITY MMT:    MMT Right Eval Left Eval  Hip flexion    Hip extension     Hip abduction    Hip adduction    Hip internal rotation    Hip external rotation    Knee flexion    Knee extension    Ankle dorsiflexion    Ankle plantarflexion    Ankle inversion    Ankle eversion    (Blank rows = not tested)  BED MOBILITY:  Findings: Sit to supine Complete Independence Supine to sit Complete Independence Rolling to Right Complete Independence Rolling to Left Complete Independence - needs increased time and momentum for L and R Ind w/ features of tempurpedic style bed  TRANSFERS: Sit to stand: SBA  Assistive device utilized: Single point cane     Stand to sit: SBA  Assistive device utilized: Single point cane     Chair to chair: SBA  Assistive device utilized: Single point cane       RAMP:  Not tested  CURB:  Not tested  STAIRS: Not tested GAIT: Findings: Distance walked: various clinic distances and Comments: Pt has mild right reliance w/ SPC, no foot scuff/toe catch even without AFO, shortened stride and slightly diminished flexion pattern of LLE  FUNCTIONAL TESTS:  5 times sit  to stand: 18.89 sec w/ heavy right trunk shortening and RUE reliance w/ L toe down vs full weight bearing  PATIENT SURVEYS:  ABC scale: The Activities-Specific Balance Confidence (ABC) Scale TBA                                                                                                                              TREATMENT DATE: 04-18-24  -L DF slide x115' w/ SPC and SBA, continued improved consistency of flicks; decreased size of beanbag by ~1 square inch -Incline step throughs (midway up ramp) x15 using SPC for stability SBA -RLE step taps x15 progressing to RUE support only -Prolonged SLS on LLE 2x up to 60 seconds (second round PT facilitating ankle midline w/ pt reporting good burn and better weight in medial aspect of toe) -Alternating step taps to top step x5 each LE w/ BUE support > pt prefers to warm up LLE w/ repeated single step taps x5 -Decline step  downs from midway down ramp working towards 3 lines down for increased stride over 10 reps, CGA due to increased instability - pt cites mirror throwing his balance off  PATIENT EDUCATION: Education details:  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  Don't rest ankle in inverted positioning for prolonged periods. Person educated: Patient Education method: Medical Illustrator Education comprehension: verbalized understanding and needs further education  HOME EXERCISE PROGRAM: Access Code: [facility] URL: [email or web address] Date: 03/21/2024 Prepared by: Daved Bull  Exercises - Seated Toe Raise  - 1-2 x daily - 4 x weekly - 2 sets - 10 reps - 2-3 seconds hold - Ankle Inversion Eversion Towel Slide  - 1-2 x daily - 4 x weekly - 2 sets - 10 reps - Seated Ankle Circles  - 1-2 x daily - 4 x weekly - 2 sets - 10 reps - Seated Heel Raise  - 1-2 x daily - 4 x weekly - 2 sets - 10 reps - 2-3 seconds hold - Seated Ankle Inversion Eversion PROM  - 1 x daily - 7 x weekly - 2 sets - 10 reps - Staggered Sit-to-Stand  - 1 x daily - 4-5 x weekly - 2-3 sets - 10 reps - Seated Hamstring Stretch  - 1 x daily - 4-5 x weekly - 1 sets - 3-4 reps - 45 seconds hold - Hooklying Single Knee to Chest Stretch with Towel  - 1 x daily - 4-5 x weekly - 1 sets - 3-4 reps - 45 seconds hold - Seated Toe Towel Scrunches  - 1 x daily - 4 x weekly - 1-2 sets - 10 reps - Seated Arch Lifts  - 1 x daily - 4 x weekly - 1-2 sets - 10 reps  Desensitization Techniques: -Light touch/pressure:  using fingertip pressure to slowly move up small segments of the affected body part until outside the area of pain and then back to where you started -Deep pressure:  using fingertips  to squeeze in small segments starting outside the affected area, crossing over the affected area, and then to the other side of the affected area -Tapping:  fingertips tap with firm pressure over the affected  area, can move around the affected area as well -Brushing/scratching:  use fingertips to brush/scratch over and around the affected area -Textures:  use soft and rough textured items like cotton balls vs wash cloths to brush or rub with light into firm pressure over and around the affected area  Repeat these 3-4x per day.   GOALS: Goals reviewed with patient? Yes  SHORT TERM GOALS: Target date: 04/08/2024  Pt will be independent and compliant with introductory strength and balance HEP in order to maintain functional progress and improve mobility. Baseline:  To be established. Goal status: INITIAL  2.  Pt will demonstrate adequate balance strategies to maintain upright with forward/overhead/and floor reaching. Baseline:  Very narrow limits of stability - several near falls related to this. Goal status: INITIAL  3.  Pt will be independent with desensitization strategies for LLE hypersensitivity management. Baseline: To be provided. Goal status: INITIAL  LONG TERM GOALS: Target date: 05/06/2024  Pt will be independent and compliant with advanced and finalized strength and balance HEP in order to maintain functional progress and improve mobility. Baseline: To be established. Goal status: INITIAL  2.  Patient will improve ABC Scale score to >/=76% in order to demonstrate decreased risk for falls and increased confidence in balance. Baseline: 66.875% (10/14) Goal status: INITIAL  3.  Patient will demonstrate floor recovery w/ no more than SBA in order to improve safety and home management. Baseline: Reports recent minA even w/ support surface Goal status: INITIAL  4.  Pt will decrease 5xSTS to </=12 seconds w/ improved midline mechanics in order to demonstrate decreased risk for falls and improved functional bilateral LE strength and power. Baseline: 18.89 sec w/ heavy right trunk shortening and RUE reliance w/ L toe down vs full weight bearing Goal status:  INITIAL  ASSESSMENT:  CLINICAL IMPRESSION: Focus of skilled session today on further work to address LLE stability and ankle positioning.  His stride appears to be improving in length and his ankle mobility is also improving as he reports increased functionality with complex tasks at home like mowing grass using left pedal controls.  He would like to return to addressing core at the end of the session as he finds this helpful with not only his balance, but overhead reach.  PT to balance future sessions per pt preference to continue progress towards goals. Continue per POC.  OBJECTIVE IMPAIRMENTS: Abnormal gait, decreased activity tolerance, decreased balance, decreased cognition, decreased coordination, decreased endurance, decreased knowledge of use of DME, decreased strength, impaired sensation, impaired tone, improper body mechanics, and postural dysfunction.   ACTIVITY LIMITATIONS: carrying, lifting, bending, squatting, stairs, transfers, reach over head, and locomotion level  PARTICIPATION LIMITATIONS: laundry, shopping, and community activity  PERSONAL FACTORS: Age, Fitness, Past/current experiences, Time since onset of injury/illness/exacerbation, and 1-2 comorbidities: HTN, Afib are also affecting patient's functional outcome.   REHAB POTENTIAL: Good  CLINICAL DECISION MAKING: Stable/uncomplicated  EVALUATION COMPLEXITY: Moderate  PLAN:  PT FREQUENCY: 2x/week  PT DURATION: 8 weeks  PLANNED INTERVENTIONS: [id number]- PT Re-evaluation, 97750- Physical Performance Testing, 97110-Therapeutic exercises, 97530- Therapeutic activity, [id number]- Neuromuscular re-education, 97535- Self Care, [id number]- Manual therapy, [id number]- Gait training, [phone]- Orthotic Initial, [phone]- Orthotic/Prosthetic subsequent, [phone]- Aquatic Therapy, [phone]- Electrical stimulation (manual), Patient/Family education, Balance training, Stair training, Taping, Joint mobilization, Vestibular training, and  DME instructions  PLAN  FOR NEXT SESSION: Address orthotic if pt requests!   Lt hip strengthening; Gait training w/ SPC.  Floor recovery - incorporate tight space setup - repeat w/ full transition to bottom and incorporate LLE use to recover to standing.  LLE NMR/strength/reaction time.  Expand strength and balance HEP.  Core strength - work mat table level on tasks to improve floor recovery from stomach and supine.  Reaching floor/forward/overhead. Perturbations/resisted gait/farmer's carry.  Lateral stepping and left knee flexion w/ swing through.  Try ladder?    Pt felt challenged w/: Stairs/step taps/up; incline step through Standing 10lb kettlebell hip flexion LLE (isometric DF) Walking beanbag DF SLS taps to top step on L leg Prolonged L stance 6 step w/ PT facilitating ankle posture Decline RLE step through working into lengthened stride 10 minutes on core at end of session - standing?  Pt declines offer of aquatic therapy at evaluation.  Daved KATHEE Bull, PT, DPT 04/18/2024, 4:26 PM

## 2024-04-18 NOTE — Therapy (Signed)
 OUTPATIENT OCCUPATIONAL THERAPY NEURO TREATMENT  Patient Name: Donald [name] MRN: [id number] DOB:[birth year], 78 y.o., male Today's Date: 04/18/2024  PCP: Kip Righter, MD REFERRING PROVIDER: Sherryl Bouchard, NP  END OF SESSION:  OT End of Session - 04/18/24 1359     Visit Number 5    Number of Visits 17    Date for Recertification  06/04/24    Authorization Type MCR A&B/[employer]    Progress Note Due on Visit 10    OT Start Time 1400    OT Stop Time 1445    OT Time Calculation (min) 45 min    Equipment Utilized During Treatment Perfection game, putty, green theraband    Activity Tolerance Patient tolerated treatment well    Behavior During Therapy WFL for tasks assessed/performed           Past Medical History:  Diagnosis Date   Diabetes mellitus without complication (HCC)    Dysrhythmia    Hyperlipemia    Hypertension    large right middle cerebral artery infarct, 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 s/p IV tPA, b. source unknown, c. loop recorder placed   Snoring 07/03/2015   Past Surgical History:  Procedure Laterality Date   LOOP RECORDER IMPLANT  06/07/2013   MDT LinQ implanted by Dr Kelsie for cryptogenic stroke   LOOP RECORDER IMPLANT N/A 06/07/2013   Procedure: LOOP RECORDER IMPLANT;  Surgeon: Lynwood JONETTA Kelsie, MD;  Location: [facility] CATH [facility];  Service: Cardiovascular;  Laterality: N/A;   TEE WITHOUT CARDIOVERSION N/A 06/07/2013   Procedure: TRANSESOPHAGEAL ECHOCARDIOGRAM (TEE);  Surgeon: Leim VEAR Moose, MD;  Location: [facility] ENDOSCOPY;  Service: Cardiovascular;  Laterality: N/A;   TONSILLECTOMY     TRANSANAL HEMORRHOIDAL DEARTERIALIZATION N/A 02/18/2023   Procedure: TRANSANAL HEMORRHOIDAL DEARTERIALIZATION;  Surgeon: Debby Hila, MD;  Location: [facility] ORS;  Service: General;  Laterality: N/A;   UMBILICAL HERNIA REPAIR     Patient Active Problem List   Diagnosis Date Noted   Permanent atrial fibrillation (HCC) 05/17/2019   Acquired thrombophilia 05/17/2019   Snoring  07/03/2015   Persistent atrial fibrillation (HCC) 04/16/2015   Hypokalemia 09/20/2014   HTN (hypertension) 09/19/2014   HLD (hyperlipidemia) 09/19/2014   Diabetes mellitus without complication (HCC) 09/19/2014   Jerking 09/19/2014   Alterations of sensations, late effect of cerebrovascular disease 11/07/2013   Disturbances of vision, late effect of cerebrovascular disease 11/07/2013   Sinus bradycardia 08/08/2013   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HCC) 07/26/2013   Left-sided neglect 07/26/2013   Small vessel disease 06/07/2013   Obesity, unspecified 06/07/2013   Cerebral infarction (HCC) 06/07/2013   large right middle cerebral artery infarct, 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 He reports completing desensitization multiple times a day. He reports it was less sensitive when he held onto handrail of steps.    Pt accompanied by: self  PERTINENT HISTORY: R MCA infarct 06/03/2013 S/p tPA, HTN, DM2, Afib, had 1 seizure in 2017   DIAGNOSTIC FINDINGS:  Brain MRI 09/20/2014 IMPRESSION: 1. No acute infarct. 2. Chronic right MCA territory infarct. 3. Subtle T2 hyperintensity in the left hippocampus and left temporal lobe  cortex versus artifact. Recent seizure activity is a consideration  PRECAUTIONS: Fall and Other: loop recorder, L sided weakness  WEIGHT BEARING RESTRICTIONS: No  PAIN:  Are you having pain? No  FALLS: Has patient fallen in last 6 months? Yes. Number of falls 4 in the last  year  LIVING ENVIRONMENT: Lives with: lives with their spouse Lives in: House/apartment 1 level Stairs: No Has following equipment at home: Single point cane, shower chair/bench with back, walk in shower  PLOF: Requires assistive device for independence  PATIENT GOALS: I'd like to be able to use my left hand more functionally. I want to improve my coordination and reaction time.  OBJECTIVE:  Note: Objective measures were completed at Evaluation unless otherwise noted.  HAND DOMINANCE: Right  ADLs: Overall ADLs: Independent  Equipment: Shower seat with back and Walk in shower  IADLs: Cooks and cleans bathrooms and kitchen Shopping:  Light housekeeping: Independent Meal Prep: Independent Community mobility: Wife completes  Medication management: Independent Landscape architect: Independent Handwriting: did not assess d/t nondominant L hand being affected  MOBILITY STATUS: ambulates with SPC  POSTURE COMMENTS:  rounded shoulders, flexed trunk , and weight shift right - mildly flexed trunk and right weight shift in standing and with ambulation, L shoulder more rounded than R w/ mild shoulder hike   ACTIVITY TOLERANCE: Activity tolerance: no changes  FUNCTIONAL OUTCOME MEASURES: PSFS:   UPPER EXTREMITY ROM:  WNL  Active ROM Right eval Left eval  Shoulder flexion    Shoulder abduction    Shoulder adduction    Shoulder extension    Shoulder internal rotation    Shoulder external rotation    Elbow flexion    Elbow extension    Wrist flexion    Wrist extension    Wrist ulnar deviation    Wrist radial deviation    Wrist pronation    Wrist supination    (Blank rows = not tested)  UPPER EXTREMITY MMT:   4-/5 shoulder   MMT Right eval Left eval  Shoulder flexion    Shoulder abduction    Shoulder adduction    Shoulder extension    Shoulder internal rotation    Shoulder external rotation    Middle trapezius    Lower trapezius    Elbow flexion    Elbow  extension    Wrist flexion    Wrist extension    Wrist ulnar deviation    Wrist radial deviation    Wrist pronation    Wrist supination    (Blank rows = not tested)  HAND FUNCTION: Grip strength: Right: 73.1 lbs; Left: 58.2 lbs  COORDINATION: 9 Hole Peg test: Right: 26.32 sec; Left: 65.08 sec Box and Blocks:  Right 49 blocks, Left 29 blocks  SENSATION: Reports hypersensitivity in fingertips  EDEMA: Pt reports hx of edema but it is rare now.  MUSCLE TONE: LUE: Modifed Ashworth Scale 1 = Slight increase in muscle tone, manifested by a catch and release or by minimal resistance at the end of the range of motion when the affected part(s) is moved in flexion or extension  COGNITION: Overall cognitive status: Within functional limits for tasks assessed  VISION: Subjective report: Pt reports having small cataracts but it is not affecting vision at this time Baseline vision: Wears glasses all the time Visual history: cataracts well kept  VISION ASSESSMENT: To be further assessed in functional context  PERCEPTION: WFL  PRAXIS: WFL  OBSERVATIONS: Impaired FM coordination, grip strength, strength in L hand and shoulder,  hypersensitivity in fingertips of L hand                                                                                                                           TREATMENT:  Pt engaged in therapeutic activity honing L gross motor coordination and reaction time, hitting Blaze pods with boom wacker first with R hand, then with left. 952 avg reaction time R hand, 765 reaction time L hand.  Pt educated in UE HEP to improve L shoulder strength. See Pt instructions for detailed handout. Pt demonstrated good understanding, min cues required for proper form.   Removed 5 coins from red theraputty, 20 squeezes with L hand. Next completed 10-15 reps of tip to tip pinch, 3 point pinch, and key pinch on red putty. Pt reports not completing it much at home d/t not having an  appropriate flat tabletop surface to place putty on.   Finally participated in FM coordination activity picking up small Perfection game pieces and inserting in hole.   PATIENT EDUCATION: Education details: Shoulder HEP Person educated: Patient Education method: Explanation, Demonstration, Tactile cues, Verbal cues, and Handouts Education comprehension: verbalized understanding, returned demonstration, verbal cues required, tactile cues required, and needs further education  HOME EXERCISE PROGRAM: 04/04/24: Desensitization 04/07/24: Putty, coordination 04/14/24: Golf solitaire 11.17.25: Access Code: [email or web address] URL: [email or web address] Date: 04/18/2024 Prepared by: Rocky Dutch  Exercises - Standing Shoulder Horizontal Abduction with Resistance  - 1 x daily - 5 x weekly - 2 sets - 15 reps - Standing Shoulder Abduction with Self-Anchored Resistance  - 1 x daily - 5 x weekly - 2 sets - 15 reps - Standing Shoulder Flexion with Self-Anchored Resistance  - 1 x daily - 5 x weekly - 2 sets - 15 reps - Seated Shoulder Extension with Resistance  - 1 x daily - 5 x weekly - 2 sets - 10 reps   GOALS: Goals reviewed with patient? Yes  SHORT TERM GOALS: Target date: 05/04/24  Pt will be independent with HEP completion for coordination and strengthening Baseline: New to OP OT Goal status: IN PROGRESS  2.  Pt will be independent with desensitization techniques Baseline: Educated  Goal status: IN PROGRESS  3.  Pt will increase L shoulder strength to 4/5 Baseline: 4-/5  Goal status:  IN PROGRESS  4.  Pt will demonstrate improved coordination in L hand by a score of 34 blocks in Box and Blocks Test Baseline: Right 49 blocks, Left 29 blocks Goal status:  IN PROGRESS  LONG TERM GOALS: Target date: 06/04/24  Pt will demonstrate an increase of at least 2.0 score of average PSFS score or a 2 point increase in individual activity score in PSFS Baseline:  Goal status:  IN  PROGRESS  2.  Pt will demonstrate improved L hand FM coordination by a score of 60 seconds or less in 9HPT Baseline: Right: 26.32 sec; Left: 65.08 sec Goal status:  IN PROGRESS  3.  Pt will demonstrate  improved grip strength in L hand by scoring at least 65 pounds Baseline: Right: 73.1 lbs; Left: 58.2 lbs Goal status:  IN PROGRESS  4.  Pt will demonstrate improved L coordination by a score of at least 39 blocks on Box and Blocks test Baseline: 29 blocks Goal status:  IN PROGRESS   ASSESSMENT:  CLINICAL IMPRESSION: Patient presents with L sided weakness and impaired coordination secondary to CVA. Pt reports slightly reduced hypersensitivity in L hand and is motivated to participate in LUE strengthening. Pt would benefit from continued skilled services to further improve L shoulder strength, grip strength, and coordination for bilateral coordination tasks such as playing guitar and IADLs. PERFORMANCE DEFICITS: in functional skills including IADLs, coordination, dexterity, sensation, strength, Fine motor control, Gross motor control, decreased knowledge of precautions, and UE functional use, cognitive skills including safety awareness, and psychosocial skills including coping strategies and environmental adaptation.   IMPAIRMENTS: are limiting patient from IADLs, work, and leisure.   CO-MORBIDITIES: may have co-morbidities  that affects occupational performance. Patient will benefit from skilled OT to address above impairments and improve overall function  REHAB POTENTIAL: Good  PLAN:  OT FREQUENCY: 2x/week  OT DURATION: 8 weeks  PLANNED INTERVENTIONS: 97168 OT Re-evaluation, 97535 self care/ADL training, 02889 therapeutic exercise, 97530 therapeutic activity, 97112 neuromuscular re-education, passive range of motion, psychosocial skills training, coping strategies training, patient/family education, and DME and/or AE instructions  RECOMMENDED OTHER SERVICES: none at this  time  CONSULTED AND AGREED WITH PLAN OF CARE: Patient  PLAN FOR NEXT SESSION:  Coordination activities- gross motor activities  Blazepods Strengthening activities L shoulder and hand F/u L shoulder HEP  Bucky Grigg, OT 04/18/2024, 2:45 PM

## 2024-04-21 ENCOUNTER — Encounter: Payer: Self-pay | Admitting: Physical Therapy

## 2024-04-21 ENCOUNTER — Ambulatory Visit: Admitting: Occupational Therapy

## 2024-04-21 ENCOUNTER — Ambulatory Visit: Admitting: Physical Therapy

## 2024-04-21 DIAGNOSIS — R29898 Other symptoms and signs involving the musculoskeletal system: Secondary | ICD-10-CM

## 2024-04-21 DIAGNOSIS — R2681 Unsteadiness on feet: Secondary | ICD-10-CM

## 2024-04-21 DIAGNOSIS — M6281 Muscle weakness (generalized): Secondary | ICD-10-CM

## 2024-04-21 DIAGNOSIS — R208 Other disturbances of skin sensation: Secondary | ICD-10-CM

## 2024-04-21 DIAGNOSIS — I69354 Hemiplegia and hemiparesis following cerebral infarction affecting left non-dominant side: Secondary | ICD-10-CM | POA: Diagnosis not present

## 2024-04-21 DIAGNOSIS — R278 Other lack of coordination: Secondary | ICD-10-CM

## 2024-04-21 DIAGNOSIS — R29818 Other symptoms and signs involving the nervous system: Secondary | ICD-10-CM

## 2024-04-21 DIAGNOSIS — R209 Unspecified disturbances of skin sensation: Secondary | ICD-10-CM | POA: Diagnosis not present

## 2024-04-21 NOTE — Therapy (Signed)
 OUTPATIENT PHYSICAL THERAPY NEURO TREATMENT   Patient Name: Donald [name] MRN: [id number] DOB:04/05/1946, 78 y.o., male Today's Date: 04/21/2024   PCP: Donald Righter, MD REFERRING PROVIDER: Sherryl Bouchard, NP  END OF SESSION:  PT End of Session - 04/21/24 1531     Visit Number 13    Number of Visits 17   16 + eval   Date for Recertification  05/13/24   pushed out due to scheduling delay   Authorization Type Medicare A&B w/ [employer] supplement    Progress Note Due on Visit 10    PT Start Time 1533    PT Stop Time 1621    PT Time Calculation (min) 48 min    Equipment Utilized During Treatment Gait belt    Activity Tolerance Patient tolerated treatment well    Behavior During Therapy WFL for tasks assessed/performed          Past Medical History:  Diagnosis Date   Diabetes mellitus without complication (HCC)    Dysrhythmia    Hyperlipemia    Hypertension    large right middle cerebral artery infarct, 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 s/p IV tPA, b. source unknown, c. loop recorder placed   Snoring 07/03/2015   Past Surgical History:  Procedure Laterality Date   LOOP RECORDER IMPLANT  06/07/2013   MDT LinQ implanted by Dr [name] for cryptogenic stroke   LOOP RECORDER IMPLANT N/A 06/07/2013   Procedure: LOOP RECORDER IMPLANT;  Surgeon: Donald JONETTA Kelsie, MD;  Location: [facility];  Service: Cardiovascular;  Laterality: N/A;   TEE WITHOUT CARDIOVERSION N/A 06/07/2013   Procedure: TRANSESOPHAGEAL ECHOCARDIOGRAM (TEE);  Surgeon: Donald VEAR Moose, MD;  Location: [facility];  Service: Cardiovascular;  Laterality: N/A;   TONSILLECTOMY     TRANSANAL HEMORRHOIDAL DEARTERIALIZATION N/A 02/18/2023   Procedure: TRANSANAL HEMORRHOIDAL DEARTERIALIZATION;  Surgeon: Donald Hila, MD;  Location: [facility];  Service: General;  Laterality: N/A;   UMBILICAL HERNIA REPAIR     Patient Active Problem List   Diagnosis Date Noted   Permanent atrial fibrillation (HCC) 05/17/2019    Acquired thrombophilia 05/17/2019   Snoring 07/03/2015   Persistent atrial fibrillation (HCC) 04/16/2015   Hypokalemia 09/20/2014   HTN (hypertension) 09/19/2014   HLD (hyperlipidemia) 09/19/2014   Diabetes mellitus without complication (HCC) 09/19/2014   Jerking 09/19/2014   Alterations of sensations, late effect of cerebrovascular disease 11/07/2013   Disturbances of vision, late effect of cerebrovascular disease 11/07/2013   Sinus bradycardia 08/08/2013   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HCC) 07/26/2013   Left-sided neglect 07/26/2013   Small vessel disease 06/07/2013   Obesity, unspecified 06/07/2013   Cerebral infarction (HCC) 06/07/2013   large right middle cerebral artery infarct, 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   Hypertension 06/03/2013   Diabetes (HCC) 06/03/2013    ONSET DATE: 06/03/2013 (CVA)  REFERRING DIAG: P30.645 (ICD-10-CM) - Hemiparesis affecting left side as late effect of cerebrovascular accident (HCC)  THERAPY DIAG:  Muscle weakness (generalized)  Other lack of coordination  Other disturbances of skin sensation  Other symptoms and signs involving the nervous system  Hemiplegia and hemiparesis following cerebral infarction affecting left non-dominant side (HCC)  Unsteadiness on feet  Other symptoms and signs involving the musculoskeletal system  Rationale for Evaluation and Treatment: Rehabilitation  SUBJECTIVE:  SUBJECTIVE STATEMENT: Donald [name]  Pt denies falls or acute status changes.  He denies pain.  He states he feels fine today.  Received following OT session. Pt accompanied by: self (Wife - waiting in car)  PERTINENT HISTORY: R MCA infarct 06/03/2013 S/p tPA, HTN, DM2, Afib, had 1 seizure in 2017  PAIN:  Are you having pain? No  PRECAUTIONS: Fall and Other:  loop recorder  RED FLAGS: None   WEIGHT BEARING RESTRICTIONS: No  FALLS: Has patient fallen in last 6 months? Yes. Number of falls 3  LIVING ENVIRONMENT: Lives with: lives with their spouse Lives in: House/apartment Stairs: No Has following equipment at home: Single point cane  PLOF: Requires assistive device for independence  PATIENT GOALS: To work on reaction time and getting off the floor  OBJECTIVE:  Note: Objective measures were completed at Evaluation unless otherwise noted.  DIAGNOSTIC FINDINGS:  Brain MRI 09/20/2014 IMPRESSION: 1. No acute infarct. 2. Chronic right MCA territory infarct. 3. Subtle T2 hyperintensity in the left hippocampus and left temporal lobe cortex versus artifact. Recent seizure activity is a consideration.  COGNITION: Overall cognitive status: History of cognitive impairments - at baseline - reports quicker to anger/verbalize emotions/sometimes overstimulated with sounds and distractions   SENSATION: Light touch: WFL - LLE hypersensitive/question of allodynia  COORDINATION: Mildly dysmetric LLE  EDEMA:  None significant in BLE  MUSCLE TONE: LLE: Modifed Ashworth Scale 1+ = Slight increase in muscle tone, manifested by a catch, followed by minimal resistance throughout the remainder (less than half) of the ROM  POSTURE: rounded shoulders, flexed trunk , and weight shift right - mildly flexed trunk and right weight shift in standing and with ambulation, L shoulder more rounded than R w/ mild shoulder hike  LOWER EXTREMITY ROM:     Active  Right Eval Left Eval  Hip flexion WNL WFL  Hip extension    Hip abduction    Hip adduction    Hip internal rotation    Hip external rotation    Knee flexion    Knee extension    Ankle dorsiflexion  2+  Ankle plantarflexion    Ankle inversion    Ankle eversion     (Blank rows = not tested)  LOWER EXTREMITY MMT:    MMT Right Eval Left Eval  Hip flexion    Hip extension    Hip abduction     Hip adduction    Hip internal rotation    Hip external rotation    Knee flexion    Knee extension    Ankle dorsiflexion    Ankle plantarflexion    Ankle inversion    Ankle eversion    (Blank rows = not tested)  BED MOBILITY:  Findings: Sit to supine Complete Independence Supine to sit Complete Independence Rolling to Right Complete Independence Rolling to Left Complete Independence - needs increased time and momentum for L and R Ind w/ features of tempurpedic style bed  TRANSFERS: Sit to stand: SBA  Assistive device utilized: Single point cane     Stand to sit: SBA  Assistive device utilized: Single point cane     Chair to chair: SBA  Assistive device utilized: Single point cane       RAMP:  Not tested  CURB:  Not tested  STAIRS: Not tested GAIT: Findings: Distance walked: various clinic distances and Comments: Pt has mild right reliance w/ SPC, no foot scuff/toe catch even without AFO, shortened stride and slightly diminished flexion pattern of LLE  FUNCTIONAL  TESTS:  5 times sit to stand: 18.89 sec w/ heavy right trunk shortening and RUE reliance w/ L toe down vs full weight bearing  PATIENT SURVEYS:  ABC scale: The Activities-Specific Balance Confidence (ABC) Scale TBA                                                                                                                              TREATMENT DATE: 04-21-24  -L DF slide x200' w/ SPC and SBA, continued improved consistency of flicks; decreased size of beanbag by ~1 square inch -Prolonged SLS on LLE 3x 60 seconds (PT facilitating ankle midline w/ pt reporting good burn and better weight in medial aspect of left toe) -Decline step downs from platform down ramp working towards 3 lines down for increased stride over 8 reps, CGA due to increased instability  Core focus: -5lb standing chest press 2x10 -5lb wood chops 2x10 each side -Seated crunch w/ heel raise x10 -Knee to elbow (seated) x8 each side;  used active stretching of LUE between sets due to synergy -Contralateral march and overhead reach x10; return demo and mod cues for sequencing -Seated windmill x10 alternating   PATIENT EDUCATION: Education details:  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  Don't rest ankle in inverted positioning for prolonged periods.  Briefly discussed his plans to see ortho MD for final opinion on brace options. Person educated: Patient Education method: Medical Illustrator Education comprehension: verbalized understanding and needs further education  HOME EXERCISE PROGRAM: Access Code: [facility] URL: [email or web address] Date: 03/21/2024 Prepared by: Daved Bull  Exercises - Seated Toe Raise  - 1-2 x daily - 4 x weekly - 2 sets - 10 reps - 2-3 seconds hold - Ankle Inversion Eversion Towel Slide  - 1-2 x daily - 4 x weekly - 2 sets - 10 reps - Seated Ankle Circles  - 1-2 x daily - 4 x weekly - 2 sets - 10 reps - Seated Heel Raise  - 1-2 x daily - 4 x weekly - 2 sets - 10 reps - 2-3 seconds hold - Seated Ankle Inversion Eversion PROM  - 1 x daily - 7 x weekly - 2 sets - 10 reps - Staggered Sit-to-Stand  - 1 x daily - 4-5 x weekly - 2-3 sets - 10 reps - Seated Hamstring Stretch  - 1 x daily - 4-5 x weekly - 1 sets - 3-4 reps - 45 seconds hold - Hooklying Single Knee to Chest Stretch with Towel  - 1 x daily - 4-5 x weekly - 1 sets - 3-4 reps - 45 seconds hold - Seated Toe Towel Scrunches  - 1 x daily - 4 x weekly - 1-2 sets - 10 reps - Seated Arch Lifts  - 1 x daily - 4 x weekly - 1-2 sets - 10 reps  Desensitization Techniques: -Light touch/pressure:  using fingertip pressure to slowly move up small segments of the affected body part  until outside the area of pain and then back to where you started -Deep pressure:  using fingertips to squeeze in small segments starting outside the affected area, crossing over the affected area, and then to the other  side of the affected area -Tapping:  fingertips tap with firm pressure over the affected area, can move around the affected area as well -Brushing/scratching:  use fingertips to brush/scratch over and around the affected area -Textures:  use soft and rough textured items like cotton balls vs wash cloths to brush or rub with light into firm pressure over and around the affected area  Repeat these 3-4x per day.   GOALS: Goals reviewed with patient? Yes  SHORT TERM GOALS: Target date: 04/08/2024  Pt will be independent and compliant with introductory strength and balance HEP in order to maintain functional progress and improve mobility. Baseline:  To be established. Goal status: INITIAL  2.  Pt will demonstrate adequate balance strategies to maintain upright with forward/overhead/and floor reaching. Baseline:  Very narrow limits of stability - several near falls related to this. Goal status: INITIAL  3.  Pt will be independent with desensitization strategies for LLE hypersensitivity management. Baseline: To be provided. Goal status: INITIAL  LONG TERM GOALS: Target date: 05/06/2024  Pt will be independent and compliant with advanced and finalized strength and balance HEP in order to maintain functional progress and improve mobility. Baseline: To be established. Goal status: INITIAL  2.  Patient will improve ABC Scale score to >/=76% in order to demonstrate decreased risk for falls and increased confidence in balance. Baseline: 66.875% (10/14) Goal status: INITIAL  3.  Patient will demonstrate floor recovery w/ no more than SBA in order to improve safety and home management. Baseline: Reports recent minA even w/ support surface Goal status: INITIAL  4.  Pt will decrease 5xSTS to </=12 seconds w/ improved midline mechanics in order to demonstrate decreased risk for falls and improved functional bilateral LE strength and power. Baseline: 18.89 sec w/ heavy right trunk shortening and  RUE reliance w/ L toe down vs full weight bearing Goal status: INITIAL  ASSESSMENT:  CLINICAL IMPRESSION: Focus of skilled session today on LLE stance and ankle posture initially.  Ended session with increased emphasis on core engagement per pt preference as it pertains to his balance and overall mobility.  He demonstrates increased fatigue with core challenges today as well as increased difficulty maintaining coordination to task.  He stands to benefit from ongoing skilled PT interventions to improve independence with floor transfers and lessen likelihood of injury to left ankle. Continue per POC.  OBJECTIVE IMPAIRMENTS: Abnormal gait, decreased activity tolerance, decreased balance, decreased cognition, decreased coordination, decreased endurance, decreased knowledge of use of DME, decreased strength, impaired sensation, impaired tone, improper body mechanics, and postural dysfunction.   ACTIVITY LIMITATIONS: carrying, lifting, bending, squatting, stairs, transfers, reach over head, and locomotion level  PARTICIPATION LIMITATIONS: laundry, shopping, and community activity  PERSONAL FACTORS: Age, Fitness, Past/current experiences, Time since onset of injury/illness/exacerbation, and 1-2 comorbidities: HTN, Afib are also affecting patient's functional outcome.   REHAB POTENTIAL: Good  CLINICAL DECISION MAKING: Stable/uncomplicated  EVALUATION COMPLEXITY: Moderate  PLAN:  PT FREQUENCY: 2x/week  PT DURATION: 8 weeks  PLANNED INTERVENTIONS: [id number]- PT Re-evaluation, 97750- Physical Performance Testing, 97110-Therapeutic exercises, 97530- Therapeutic activity, [id number]- Neuromuscular re-education, 97535- Self Care, [id number]- Manual therapy, [id number]- Gait training, [phone]- Orthotic Initial, [phone]- Orthotic/Prosthetic subsequent, [phone]- Aquatic Therapy, [phone]- Electrical stimulation (manual), Patient/Family education, Balance training, Stair training, Taping, Joint mobilization,  Vestibular training, and  DME instructions  PLAN FOR NEXT SESSION: Address orthotic if pt requests!  Floor recovery - incorporate tight space setup - repeat w/ full transition to bottom and incorporate LLE use to recover to standing - pt wants to work towards no UE support if possible (did discuss limitations and safety recs 11/20).  LLE NMR/strength/reaction time.  Expand strength and balance HEP.  Core strength - work mat table level on tasks to improve floor recovery from stomach and supine.  Reaching floor/forward/overhead. Perturbations/resisted gait/farmer's carry. Try ladder?    Pt felt challenged w/: Stairs/step taps/up; incline step through Standing 10lb kettlebell hip flexion LLE (isometric DF) Walking beanbag DF SLS taps to top step on L leg > L step ups Prolonged L stance 6 step w/ PT facilitating ankle posture Decline RLE step through working into lengthened stride 10 minutes on core at end of session - standing?  Pt declines offer of aquatic therapy at evaluation.  Daved KATHEE Bull, PT, DPT 04/21/2024, 5:30 PM

## 2024-04-21 NOTE — Therapy (Signed)
 OUTPATIENT OCCUPATIONAL THERAPY NEURO TREATMENT  Patient Name: Donald [name] MRN: [id number] DOB:09/06/1945, 78 y.o., male Today's Date: 04/21/2024  PCP: Kip Righter, MD REFERRING PROVIDER: Sherryl Bouchard, NP  END OF SESSION:  OT End of Session - 04/21/24 1454     Visit Number 6    Number of Visits 17    Date for Recertification  06/04/24    Authorization Type MCR A&B/AARP    Progress Note Due on Visit 10    OT Start Time 1453    OT Stop Time 1531    OT Time Calculation (min) 38 min    Equipment Utilized During Treatment Perfection game, putty, green theraband    Activity Tolerance Patient tolerated treatment well    Behavior During Therapy WFL for tasks assessed/performed         Past Medical History:  Diagnosis Date   Diabetes mellitus without complication (HCC)    Dysrhythmia    Hyperlipemia    Hypertension    large right middle cerebral artery infarct, 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 s/p IV tPA, b. source unknown, c. loop recorder placed   Snoring 07/03/2015   Past Surgical History:  Procedure Laterality Date   LOOP RECORDER IMPLANT  06/07/2013   MDT LinQ implanted by Dr Kelsie for cryptogenic stroke   LOOP RECORDER IMPLANT N/A 06/07/2013   Procedure: LOOP RECORDER IMPLANT;  Surgeon: Lynwood JONETTA Kelsie, MD;  Location: [facility] CATH [facility];  Service: Cardiovascular;  Laterality: N/A;   TEE WITHOUT CARDIOVERSION N/A 06/07/2013   Procedure: TRANSESOPHAGEAL ECHOCARDIOGRAM (TEE);  Surgeon: Leim VEAR Moose, MD;  Location: [facility] ENDOSCOPY;  Service: Cardiovascular;  Laterality: N/A;   TONSILLECTOMY     TRANSANAL HEMORRHOIDAL DEARTERIALIZATION N/A 02/18/2023   Procedure: TRANSANAL HEMORRHOIDAL DEARTERIALIZATION;  Surgeon: Debby Hila, MD;  Location: [facility] ORS;  Service: General;  Laterality: N/A;   UMBILICAL HERNIA REPAIR     Patient Active Problem List   Diagnosis Date Noted   Permanent atrial fibrillation (HCC) 05/17/2019   Acquired thrombophilia 05/17/2019   Snoring 07/03/2015    Persistent atrial fibrillation (HCC) 04/16/2015   Hypokalemia 09/20/2014   HTN (hypertension) 09/19/2014   HLD (hyperlipidemia) 09/19/2014   Diabetes mellitus without complication (HCC) 09/19/2014   Jerking 09/19/2014   Alterations of sensations, late effect of cerebrovascular disease 11/07/2013   Disturbances of vision, late effect of cerebrovascular disease 11/07/2013   Sinus bradycardia 08/08/2013   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HCC) 07/26/2013   Left-sided neglect 07/26/2013   Small vessel disease 06/07/2013   Obesity, unspecified 06/07/2013   Cerebral infarction (HCC) 06/07/2013   large right middle cerebral artery infarct, 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   Hypertension 06/03/2013   Diabetes (HCC) 06/03/2013   ONSET DATE: 06/03/2013 (CVA), 03/15/2024 referral date  REFERRING DIAG: P30.645 (ICD-10-CM) - Hemiplegia and hemiparesis following cerebral infarction affecting left non-dominant side (HCC)  THERAPY DIAG:  Muscle weakness (generalized)  Other lack of coordination  Other disturbances of skin sensation  Other symptoms and signs involving the nervous system  Hemiplegia and hemiparesis following cerebral infarction affecting left non-dominant side (HCC)  Other symptoms and signs involving the musculoskeletal system  Rationale for Evaluation and Treatment: Rehabilitation  SUBJECTIVE:   SUBJECTIVE STATEMENT: Pt reports he has not completed putty or theraband HEP with much frequency.   Pt accompanied by: self  PERTINENT HISTORY: R MCA infarct 06/03/2013 S/p tPA, HTN, DM2, Afib, had 1 seizure in 2017   DIAGNOSTIC FINDINGS:  Brain MRI 09/20/2014 IMPRESSION: 1. No acute infarct. 2. Chronic right MCA territory infarct.  3. Subtle T2 hyperintensity in the left hippocampus and left temporal lobe cortex versus artifact. Recent seizure activity is a consideration  PRECAUTIONS: Fall and Other: loop recorder, L sided weakness  WEIGHT BEARING RESTRICTIONS:  No  PAIN:  Are you having pain? No  FALLS: Has patient fallen in last 6 months? Yes. Number of falls 4 in the last year  LIVING ENVIRONMENT: Lives with: lives with their spouse Lives in: House/apartment 1 level Stairs: No Has following equipment at home: Single point cane, shower chair/bench with back, walk in shower  PLOF: Requires assistive device for independence  PATIENT GOALS: I'd like to be able to use my left hand more functionally. I want to improve my coordination and reaction time.  OBJECTIVE:  Note: Objective measures were completed at Evaluation unless otherwise noted.  HAND DOMINANCE: Right  ADLs: Overall ADLs: Independent  Equipment: Shower seat with back and Walk in shower  IADLs: Cooks and cleans bathrooms and kitchen Shopping:  Light housekeeping: Independent Meal Prep: Independent Community mobility: Wife completes  Medication management: Independent Landscape architect: Independent Handwriting: did not assess d/t nondominant L hand being affected  MOBILITY STATUS: ambulates with SPC  POSTURE COMMENTS:  rounded shoulders, flexed trunk , and weight shift right - mildly flexed trunk and right weight shift in standing and with ambulation, L shoulder more rounded than R w/ mild shoulder hike   ACTIVITY TOLERANCE: Activity tolerance: no changes  FUNCTIONAL OUTCOME MEASURES: PSFS:   UPPER EXTREMITY ROM:  WNL   UPPER EXTREMITY MMT:   4-/5 shoulder   HAND FUNCTION: Grip strength: Right: 73.1 lbs; Left: 58.2 lbs  COORDINATION: 9 Hole Peg test: Right: 26.32 sec; Left: 65.08 sec Box and Blocks:  Right 49 blocks, Left 29 blocks  SENSATION: Reports hypersensitivity in fingertips  EDEMA: Pt reports hx of edema but it is rare now.  MUSCLE TONE: LUE: Modifed Ashworth Scale 1 = Slight increase in muscle tone, manifested by a catch and release or by minimal resistance at the end of the range of motion when the affected part(s) is moved in flexion or  extension  COGNITION: Overall cognitive status: Within functional limits for tasks assessed  VISION: Subjective report: Pt reports having small cataracts but it is not affecting vision at this time Baseline vision: Wears glasses all the time Visual history: cataracts well kept  VISION ASSESSMENT: To be further assessed in functional context  PERCEPTION: WFL  PRAXIS: WFL  OBSERVATIONS: Impaired FM coordination, grip strength, strength in L hand and shoulder, hypersensitivity in fingertips of L hand                                                                                                                           TREATMENT:  - Therapeutic exercises completed for duration as noted below including:  OT reviewed LUE theraband HEP as noted in pt instructions. Pt requiring mod cueing for postural correction with completion.   - Therapeutic activities completed for duration as noted  below including:  Pt completed 24 piece puzzle for visual scanning and use of LUE using texture of wooden puzzle pieces for desensitization.  Placement and removal of red, green, blue, and black resistive clips with use of left 2 and 3 point pinch for strengthening of affected extremity. Pt able to place red, green, blue, and black clips vertically for additional challenge to shoulder ROM.  L gross grasp to pick up colored blocks and place into bowl with 35 pound hand grip for strengthening of affected extremity. Cues to maintain full grasp assumption.   Conical grasp with Left hand and stacking of cones 2 sets of 8 using  Right hand to stabilize for coordination and strength of affected extremity as well as bimanual coordination and desensitization given plastic ridges on cones.  PATIENT EDUCATION: Education details: Shoulder HEP Person educated: Patient Education method: Explanation, Demonstration, Tactile cues, Verbal cues, and Handouts Education comprehension: verbalized understanding, returned  demonstration, verbal cues required, tactile cues required, and needs further education  HOME EXERCISE PROGRAM: 04/04/24: Desensitization 04/07/24: Putty, coordination 04/14/24: Golf solitaire 11.17.25: Access Code: [email or web address] URL: [email or web address] Date: 04/18/2024 Prepared by: Rocky Dutch  Exercises - Standing Shoulder Horizontal Abduction with Resistance  - 1 x daily - 5 x weekly - 2 sets - 15 reps - Standing Shoulder Abduction with Self-Anchored Resistance  - 1 x daily - 5 x weekly - 2 sets - 15 reps - Standing Shoulder Flexion with Self-Anchored Resistance  - 1 x daily - 5 x weekly - 2 sets - 15 reps - Seated Shoulder Extension with Resistance  - 1 x daily - 5 x weekly - 2 sets - 10 reps   GOALS: Goals reviewed with patient? Yes  SHORT TERM GOALS: Target date: 05/04/24  Pt will be independent with HEP completion for coordination and strengthening Baseline: New to OP OT Goal status: IN PROGRESS  2.  Pt will be independent with desensitization techniques Baseline: Educated  Goal status: IN PROGRESS  3.  Pt will increase L shoulder strength to 4/5 Baseline: 4-/5  Goal status:  IN PROGRESS  4.  Pt will demonstrate improved coordination in L hand by a score of 34 blocks in Box and Blocks Test Baseline: Right 49 blocks, Left 29 blocks Goal status:  IN PROGRESS  LONG TERM GOALS: Target date: 06/04/24  Pt will demonstrate an increase of at least 2.0 score of average PSFS score or a 2 point increase in individual activity score in PSFS Baseline:  Goal status:  IN PROGRESS  2.  Pt will demonstrate improved L hand FM coordination by a score of 60 seconds or less in 9HPT Baseline: Right: 26.32 sec; Left: 65.08 sec Goal status:  IN PROGRESS  3.  Pt will demonstrate improved grip strength in L hand by scoring at least 65 pounds Baseline: Right: 73.1 lbs; Left: 58.2 lbs Goal status:  IN PROGRESS  4.  Pt will demonstrate improved L coordination by a score of at  least 39 blocks on Box and Blocks test Baseline: 29 blocks Goal status:  IN PROGRESS   ASSESSMENT:  CLINICAL IMPRESSION: Patient presents with L sided weakness and impaired coordination secondary to CVA. Pt reports limited carryover of HEP outside of clinic. Could benefit from additional postural correction with completion of theraband HEP to reduce compensation and to get maximum benefit.   PERFORMANCE DEFICITS: in functional skills including IADLs, coordination, dexterity, sensation, strength, Fine motor control, Gross motor control, decreased knowledge of precautions, and UE functional use, cognitive skills  including safety awareness, and psychosocial skills including coping strategies and environmental adaptation.   IMPAIRMENTS: are limiting patient from IADLs, work, and leisure.   CO-MORBIDITIES: may have co-morbidities  that affects occupational performance. Patient will benefit from skilled OT to address above impairments and improve overall function  REHAB POTENTIAL: Good  PLAN:  OT FREQUENCY: 2x/week  OT DURATION: 8 weeks  PLANNED INTERVENTIONS: 97168 OT Re-evaluation, 97535 self care/ADL training, 02889 therapeutic exercise, 97530 therapeutic activity, 97112 neuromuscular re-education, passive range of motion, psychosocial skills training, coping strategies training, patient/family education, and DME and/or AE instructions  RECOMMENDED OTHER SERVICES: none at this time  CONSULTED AND AGREED WITH PLAN OF CARE: Patient  PLAN FOR NEXT SESSION:  Coordination activities- gross motor activities  Blazepods Strengthening activities L shoulder and hand F/u L shoulder HEP  Jocelyn CHRISTELLA Bottom, OT 04/21/2024, 2:55 PM

## 2024-04-25 ENCOUNTER — Ambulatory Visit: Admitting: Physical Therapy

## 2024-04-25 ENCOUNTER — Ambulatory Visit

## 2024-04-25 ENCOUNTER — Encounter: Payer: Self-pay | Admitting: Physical Therapy

## 2024-04-25 DIAGNOSIS — R278 Other lack of coordination: Secondary | ICD-10-CM | POA: Diagnosis not present

## 2024-04-25 DIAGNOSIS — R29818 Other symptoms and signs involving the nervous system: Secondary | ICD-10-CM

## 2024-04-25 DIAGNOSIS — R2681 Unsteadiness on feet: Secondary | ICD-10-CM

## 2024-04-25 DIAGNOSIS — I69354 Hemiplegia and hemiparesis following cerebral infarction affecting left non-dominant side: Secondary | ICD-10-CM

## 2024-04-25 DIAGNOSIS — R209 Unspecified disturbances of skin sensation: Secondary | ICD-10-CM | POA: Diagnosis not present

## 2024-04-25 DIAGNOSIS — R29898 Other symptoms and signs involving the musculoskeletal system: Secondary | ICD-10-CM | POA: Diagnosis not present

## 2024-04-25 DIAGNOSIS — M6281 Muscle weakness (generalized): Secondary | ICD-10-CM

## 2024-04-25 DIAGNOSIS — R208 Other disturbances of skin sensation: Secondary | ICD-10-CM

## 2024-04-25 NOTE — Therapy (Signed)
 OUTPATIENT OCCUPATIONAL THERAPY NEURO TREATMENT  Patient Name: Donald [name] MRN: [id number] DOB:[birth year], 78 y.o., male Today's Date: 04/25/2024  PCP: Kip Righter, MD REFERRING PROVIDER: Sherryl Bouchard, NP  END OF SESSION:  OT End of Session - 04/25/24 1450     Visit Number 7    Number of Visits 17    Date for Recertification  06/04/24    Authorization Type MCR A&B/AARP    Progress Note Due on Visit 10    OT Start Time 1446    OT Stop Time 1530    OT Time Calculation (min) 44 min    Equipment Utilized During Treatment jenga, racquets and balloon, tennis ball, rice bin    Activity Tolerance Patient tolerated treatment well    Behavior During Therapy WFL for tasks assessed/performed         Past Medical History:  Diagnosis Date   Diabetes mellitus without complication (HCC)    Dysrhythmia    Hyperlipemia    Hypertension    large right middle cerebral artery infarct, 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 s/p IV tPA, b. source unknown, c. loop recorder placed   Snoring 07/03/2015   Past Surgical History:  Procedure Laterality Date   LOOP RECORDER IMPLANT  06/07/2013   MDT LinQ implanted by Dr Kelsie for cryptogenic stroke   LOOP RECORDER IMPLANT N/A 06/07/2013   Procedure: LOOP RECORDER IMPLANT;  Surgeon: Lynwood JONETTA Kelsie, MD;  Location: [facility] CATH LAB;  Service: Cardiovascular;  Laterality: N/A;   TEE WITHOUT CARDIOVERSION N/A 06/07/2013   Procedure: TRANSESOPHAGEAL ECHOCARDIOGRAM (TEE);  Surgeon: Leim VEAR Moose, MD;  Location: [facility] ENDOSCOPY;  Service: Cardiovascular;  Laterality: N/A;   TONSILLECTOMY     TRANSANAL HEMORRHOIDAL DEARTERIALIZATION N/A 02/18/2023   Procedure: TRANSANAL HEMORRHOIDAL DEARTERIALIZATION;  Surgeon: Debby Hila, MD;  Location: [facility] ORS;  Service: General;  Laterality: N/A;   UMBILICAL HERNIA REPAIR     Patient Active Problem List   Diagnosis Date Noted   Permanent atrial fibrillation (HCC) 05/17/2019   Acquired thrombophilia 05/17/2019   Snoring  07/03/2015   Persistent atrial fibrillation (HCC) 04/16/2015   Hypokalemia 09/20/2014   HTN (hypertension) 09/19/2014   HLD (hyperlipidemia) 09/19/2014   Diabetes mellitus without complication (HCC) 09/19/2014   Jerking 09/19/2014   Alterations of sensations, late effect of cerebrovascular disease 11/07/2013   Disturbances of vision, late effect of cerebrovascular disease 11/07/2013   Sinus bradycardia 08/08/2013   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HCC) 07/26/2013   Left-sided neglect 07/26/2013   Small vessel disease 06/07/2013   Obesity, unspecified 06/07/2013   Cerebral infarction (HCC) 06/07/2013   large right middle cerebral artery infarct, 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   Pt accompanied by: self  PERTINENT HISTORY: R MCA infarct 06/03/2013 S/p tPA, HTN, DM2, Afib, had 1 seizure in 2017   DIAGNOSTIC FINDINGS:  Brain MRI 09/20/2014 IMPRESSION: 1. No acute infarct. 2. Chronic right MCA territory infarct. 3. Subtle T2 hyperintensity in the left hippocampus and left temporal lobe cortex versus artifact. Recent seizure activity is a consideration  PRECAUTIONS: Fall and Other: loop  recorder, L sided weakness  WEIGHT BEARING RESTRICTIONS: No  PAIN:  Are you having pain? No  FALLS: Has patient fallen in last 6 months? Yes. Number of falls 4 in the last year  LIVING ENVIRONMENT: Lives with: lives with their spouse Lives in: House/apartment 1  level Stairs: No Has following equipment at home: Single point cane, shower chair/bench with back, walk in shower  PLOF: Requires assistive device for independence  PATIENT GOALS: I'd like to be able to use my left hand more functionally. I want to improve my coordination and reaction time.  OBJECTIVE:  Note: Objective measures were completed at Evaluation unless otherwise noted.  HAND DOMINANCE: Right  ADLs: Overall ADLs: Independent  Equipment: Shower seat with back and Walk in shower  IADLs: Cooks and cleans bathrooms and kitchen Shopping:  Light housekeeping: Independent Meal Prep: Independent Community mobility: Wife completes  Medication management: Independent Landscape architect: Independent Handwriting: did not assess d/t nondominant L hand being affected  MOBILITY STATUS: ambulates with SPC  POSTURE COMMENTS:  rounded shoulders, flexed trunk , and weight shift right - mildly flexed trunk and right weight shift in standing and with ambulation, L shoulder more rounded than R w/ mild shoulder hike   ACTIVITY TOLERANCE: Activity tolerance: no changes  FUNCTIONAL OUTCOME MEASURES: PSFS:   UPPER EXTREMITY ROM:  WNL   UPPER EXTREMITY MMT:   4-/5 shoulder   HAND FUNCTION: Grip strength: Right: 73.1 lbs; Left: 58.2 lbs  COORDINATION: 9 Hole Peg test: Right: 26.32 sec; Left: 65.08 sec Box and Blocks:  Right 49 blocks, Left 29 blocks  SENSATION: Reports hypersensitivity in fingertips  EDEMA: Pt reports hx of edema but it is rare now.  MUSCLE TONE: LUE: Modifed Ashworth Scale 1 = Slight increase in muscle tone, manifested by a catch and release or by minimal resistance at the end of the range of motion when the affected part(s) is moved in flexion or extension  COGNITION: Overall cognitive status: Within functional limits for tasks assessed  VISION: Subjective report: Pt reports having small cataracts but it is not affecting vision at this time Baseline  vision: Wears glasses all the time Visual history: cataracts well kept  VISION ASSESSMENT: To be further assessed in functional context  PERCEPTION: WFL  PRAXIS: WFL  OBSERVATIONS: Impaired FM coordination, grip strength, strength in L hand and shoulder, hypersensitivity in fingertips of L hand                                                                                                                           TREATMENT:  Pt educated in desensitization benefits of rice bin, and pt completed with L hand submerging hand in rice and locating 10 items. Next pt completed L wrist/shoulder flexion/extension hitting moving target with racquet. Pt attempted ball toss with trampoline and tennis ball to hone GM coordination, however difficulty noted d/t ball not bouncing high. Reviewed shoulder HEP, min cues required for proper form. Pt finally completed tip  to tip pinch removing wooden blocks from tower and restacking on top, pt encouraged to rub top of block for further desensitization. PATIENT EDUCATION: Education details: Rice bin benefits Person educated: Patient Education method: Explanation, Demonstration, Tactile cues, Verbal cues, and Handouts Education comprehension: verbalized understanding, returned demonstration, verbal cues required, tactile cues required, and needs further education  HOME EXERCISE PROGRAM: 04/04/24: Desensitization 04/07/24: Putty, coordination 04/14/24: Golf solitaire 11.17.25: Access Code: [email or web address] URL: [email or web address] Date: 04/18/2024 Prepared by: Rocky Dutch  Exercises - Standing Shoulder Horizontal Abduction with Resistance  - 1 x daily - 5 x weekly - 2 sets - 15 reps - Standing Shoulder Abduction with Self-Anchored Resistance  - 1 x daily - 5 x weekly - 2 sets - 15 reps - Standing Shoulder Flexion with Self-Anchored Resistance  - 1 x daily - 5 x weekly - 2 sets - 15 reps - Seated Shoulder Extension with Resistance  - 1 x daily - 5 x  weekly - 2 sets - 10 reps   GOALS: Goals reviewed with patient? Yes  SHORT TERM GOALS: Target date: 05/04/24  Pt will be independent with HEP completion for coordination and strengthening Baseline: New to OP OT Goal status: IN PROGRESS  2.  Pt will be independent with desensitization techniques Baseline: Educated  Goal status: IN PROGRESS  3.  Pt will increase L shoulder strength to 4/5 Baseline: 4-/5  Goal status:  IN PROGRESS  4.  Pt will demonstrate improved coordination in L hand by a score of 34 blocks in Box and Blocks Test Baseline: Right 49 blocks, Left 29 blocks Goal status:  IN PROGRESS  LONG TERM GOALS: Target date: 06/04/24  Pt will demonstrate an increase of at least 2.0 score of average PSFS score or a 2 point increase in individual activity score in PSFS Baseline:  Goal status:  IN PROGRESS  2.  Pt will demonstrate improved L hand FM coordination by a score of 60 seconds or less in 9HPT Baseline: Right: 26.32 sec; Left: 65.08 sec Goal status:  IN PROGRESS  3.  Pt will demonstrate improved grip strength in L hand by scoring at least 65 pounds Baseline: Right: 73.1 lbs; Left: 58.2 lbs Goal status:  IN PROGRESS  4.  Pt will demonstrate improved L coordination by a score of at least 39 blocks on Box and Blocks test Baseline: 29 blocks Goal status:  IN PROGRESS   ASSESSMENT:  CLINICAL IMPRESSION: Patient presents with L sided weakness and impaired coordination secondary to CVA. Pt reporting carryover with desensitization and minimal improvements in sensory tolerance in L hand. Pt would benefit from continued skilled services to improve GM/FM coordination and overall strength for carryover with IADLs and leisure tasks.  PERFORMANCE DEFICITS: in functional skills including IADLs, coordination, dexterity, sensation, strength, Fine motor control, Gross motor control, decreased knowledge of precautions, and UE functional use, cognitive skills including safety  awareness, and psychosocial skills including coping strategies and environmental adaptation.   IMPAIRMENTS: are limiting patient from IADLs, work, and leisure.   CO-MORBIDITIES: may have co-morbidities  that affects occupational performance. Patient will benefit from skilled OT to address above impairments and improve overall function  REHAB POTENTIAL: Good  PLAN:  OT FREQUENCY: 2x/week  OT DURATION: 8 weeks  PLANNED INTERVENTIONS: 97168 OT Re-evaluation, 97535 self care/ADL training, 02889 therapeutic exercise, 97530 therapeutic activity, 97112 neuromuscular re-education, passive range of motion, psychosocial skills training, coping strategies training, patient/family education, and DME and/or AE instructions  RECOMMENDED OTHER SERVICES: none at this time  CONSULTED AND AGREED WITH PLAN OF CARE: Patient  PLAN FOR NEXT SESSION:  Coordination activities- gross motor activities  Blazepods Strengthening activities L shoulder and hand F/u L shoulder HEP   Laurine Kuyper, OT 04/25/2024, 3:41 PM

## 2024-04-25 NOTE — Therapy (Signed)
 OUTPATIENT PHYSICAL THERAPY NEURO TREATMENT   Patient Name: Donald [name] MRN: [id number] DOB:07/12/1945, 78 y.o., male Today's Date: 04/25/2024   PCP: Kip Righter, MD REFERRING PROVIDER: Sherryl Bouchard, NP  END OF SESSION:  PT End of Session - 04/25/24 1540     Visit Number 14    Number of Visits 17   16 + eval   Date for Recertification  05/13/24   pushed out due to scheduling delay   Authorization Type Medicare A&B w/ [employer] supplement    Progress Note Due on Visit 10    PT Start Time 1537    PT Stop Time 1621    PT Time Calculation (min) 44 min    Equipment Utilized During Treatment Gait belt    Activity Tolerance Patient tolerated treatment well    Behavior During Therapy WFL for tasks assessed/performed          Past Medical History:  Diagnosis Date   Diabetes mellitus without complication (HCC)    Dysrhythmia    Hyperlipemia    Hypertension    large right middle cerebral artery infarct, 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 s/p IV tPA, b. source unknown, c. loop recorder placed   Snoring 07/03/2015   Past Surgical History:  Procedure Laterality Date   LOOP RECORDER IMPLANT  06/07/2013   MDT LinQ implanted by Dr Kelsie for cryptogenic stroke   LOOP RECORDER IMPLANT N/A 06/07/2013   Procedure: LOOP RECORDER IMPLANT;  Surgeon: Lynwood JONETTA Kelsie, MD;  Location: [facility] CATH LAB;  Service: Cardiovascular;  Laterality: N/A;   TEE WITHOUT CARDIOVERSION N/A 06/07/2013   Procedure: TRANSESOPHAGEAL ECHOCARDIOGRAM (TEE);  Surgeon: Leim VEAR Moose, MD;  Location: [facility] ENDOSCOPY;  Service: Cardiovascular;  Laterality: N/A;   TONSILLECTOMY     TRANSANAL HEMORRHOIDAL DEARTERIALIZATION N/A 02/18/2023   Procedure: TRANSANAL HEMORRHOIDAL DEARTERIALIZATION;  Surgeon: Debby Hila, MD;  Location: [facility] ORS;  Service: General;  Laterality: N/A;   UMBILICAL HERNIA REPAIR     Patient Active Problem List   Diagnosis Date Noted   Permanent atrial fibrillation (HCC) 05/17/2019    Acquired thrombophilia 05/17/2019   Snoring 07/03/2015   Persistent atrial fibrillation (HCC) 04/16/2015   Hypokalemia 09/20/2014   HTN (hypertension) 09/19/2014   HLD (hyperlipidemia) 09/19/2014   Diabetes mellitus without complication (HCC) 09/19/2014   Jerking 09/19/2014   Alterations of sensations, late effect of cerebrovascular disease 11/07/2013   Disturbances of vision, late effect of cerebrovascular disease 11/07/2013   Sinus bradycardia 08/08/2013   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HCC) 07/26/2013   Left-sided neglect 07/26/2013   Small vessel disease 06/07/2013   Obesity, unspecified 06/07/2013   Cerebral infarction (HCC) 06/07/2013   large right middle cerebral artery infarct, 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   Hypertension 06/03/2013   Diabetes (HCC) 06/03/2013    ONSET DATE: 06/03/2013 (CVA)  REFERRING DIAG: P30.645 (ICD-10-CM) - Hemiparesis affecting left side as late effect of cerebrovascular accident (HCC)  THERAPY DIAG:  Muscle weakness (generalized)  Other lack of coordination  Other disturbances of skin sensation  Other symptoms and signs involving the nervous system  Hemiplegia and hemiparesis following cerebral infarction affecting left non-dominant side (HCC)  Other symptoms and signs involving the musculoskeletal system  Unsteadiness on feet  Rationale for Evaluation and Treatment: Rehabilitation  SUBJECTIVE:  SUBJECTIVE STATEMENT: Donald [name]  Pt denies falls or acute status changes.  He denies pain.  Received following OT session.  He mowed the grass again yesterday and again reports he had no issues with navigation and use of LLE. Pt accompanied by: self (Wife - waiting in car)  PERTINENT HISTORY: R MCA infarct 06/03/2013 S/p tPA, HTN, DM2, Afib, had 1 seizure in  2017  PAIN:  Are you having pain? No  PRECAUTIONS: Fall and Other: loop recorder  RED FLAGS: None   WEIGHT BEARING RESTRICTIONS: No  FALLS: Has patient fallen in last 6 months? Yes. Number of falls 3  LIVING ENVIRONMENT: Lives with: lives with their spouse Lives in: House/apartment Stairs: No Has following equipment at home: Single point cane  PLOF: Requires assistive device for independence  PATIENT GOALS: To work on reaction time and getting off the floor  OBJECTIVE:  Note: Objective measures were completed at Evaluation unless otherwise noted.  DIAGNOSTIC FINDINGS:  Brain MRI 09/20/2014 IMPRESSION: 1. No acute infarct. 2. Chronic right MCA territory infarct. 3. Subtle T2 hyperintensity in the left hippocampus and left temporal lobe cortex versus artifact. Recent seizure activity is a consideration.  COGNITION: Overall cognitive status: History of cognitive impairments - at baseline - reports quicker to anger/verbalize emotions/sometimes overstimulated with sounds and distractions   SENSATION: Light touch: WFL - LLE hypersensitive/question of allodynia  COORDINATION: Mildly dysmetric LLE  EDEMA:  None significant in BLE  MUSCLE TONE: LLE: Modifed Ashworth Scale 1+ = Slight increase in muscle tone, manifested by a catch, followed by minimal resistance throughout the remainder (less than half) of the ROM  POSTURE: rounded shoulders, flexed trunk , and weight shift right - mildly flexed trunk and right weight shift in standing and with ambulation, L shoulder more rounded than R w/ mild shoulder hike  LOWER EXTREMITY ROM:     Active  Right Eval Left Eval  Hip flexion WNL WFL  Hip extension    Hip abduction    Hip adduction    Hip internal rotation    Hip external rotation    Knee flexion    Knee extension    Ankle dorsiflexion  2+  Ankle plantarflexion    Ankle inversion    Ankle eversion     (Blank rows = not tested)  LOWER EXTREMITY MMT:    MMT  Right Eval Left Eval  Hip flexion    Hip extension    Hip abduction    Hip adduction    Hip internal rotation    Hip external rotation    Knee flexion    Knee extension    Ankle dorsiflexion    Ankle plantarflexion    Ankle inversion    Ankle eversion    (Blank rows = not tested)  BED MOBILITY:  Findings: Sit to supine Complete Independence Supine to sit Complete Independence Rolling to Right Complete Independence Rolling to Left Complete Independence - needs increased time and momentum for L and R Ind w/ features of tempurpedic style bed  TRANSFERS: Sit to stand: SBA  Assistive device utilized: Single point cane     Stand to sit: SBA  Assistive device utilized: Single point cane     Chair to chair: SBA  Assistive device utilized: Single point cane       RAMP:  Not tested  CURB:  Not tested  STAIRS: Not tested GAIT: Findings: Distance walked: various clinic distances and Comments: Pt has mild right reliance w/ SPC, no foot scuff/toe catch even  without AFO, shortened stride and slightly diminished flexion pattern of LLE  FUNCTIONAL TESTS:  5 times sit to stand: 18.89 sec w/ heavy right trunk shortening and RUE reliance w/ L toe down vs full weight bearing  PATIENT SURVEYS:  ABC scale: The Activities-Specific Balance Confidence (ABC) Scale TBA                                                                                                                              TREATMENT DATE: 04-25-24  -L DF slide x165' w/ SPC and SBA, continued improved consistency of flicks; decreased size of beanbag by ~1 square inch -L step ups (6 step w/ BUE support) 2x5 > 2x8; on second round of 4 PT adds lateral support strap for better positioning of L ankle in stance -Decline step down working towards 3 lines from platform to lengthen stride to fatigue w/ L stance -10lb L farmer's carry x200 ft, pt reports improved LLE and LUE tolerance  Core focus: -standing windmill x10, cues  for increased amplitude of movement -Seated trunk rotation w/ 10lb kettlebell x10 each side -10lb lateral crunch x10 each side, cues to prevent shoulder hike/elbow flexion -Attempted standing birddogs at counter - modified to increase UE support and accommodate limited hip extension  PATIENT EDUCATION: Education details:  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  Don't rest ankle in inverted positioning for prolonged periods.  Briefly discussed his plans to see ortho MD for final opinion on brace options. Person educated: Patient Education method: Medical Illustrator Education comprehension: verbalized understanding and needs further education  HOME EXERCISE PROGRAM: Access Code: [facility] URL: [email or web address] Date: 03/21/2024 Prepared by: Daved Bull  Exercises - Seated Toe Raise  - 1-2 x daily - 4 x weekly - 2 sets - 10 reps - 2-3 seconds hold - Ankle Inversion Eversion Towel Slide  - 1-2 x daily - 4 x weekly - 2 sets - 10 reps - Seated Ankle Circles  - 1-2 x daily - 4 x weekly - 2 sets - 10 reps - Seated Heel Raise  - 1-2 x daily - 4 x weekly - 2 sets - 10 reps - 2-3 seconds hold - Seated Ankle Inversion Eversion PROM  - 1 x daily - 7 x weekly - 2 sets - 10 reps - Staggered Sit-to-Stand  - 1 x daily - 4-5 x weekly - 2-3 sets - 10 reps - Seated Hamstring Stretch  - 1 x daily - 4-5 x weekly - 1 sets - 3-4 reps - 45 seconds hold - Hooklying Single Knee to Chest Stretch with Towel  - 1 x daily - 4-5 x weekly - 1 sets - 3-4 reps - 45 seconds hold - Seated Toe Towel Scrunches  - 1 x daily - 4 x weekly - 1-2 sets - 10 reps - Seated Arch Lifts  - 1 x daily - 4 x weekly - 1-2 sets - 10 reps  Desensitization  Techniques: -Light touch/pressure:  using fingertip pressure to slowly move up small segments of the affected body part until outside the area of pain and then back to where you started -Deep pressure:  using fingertips to squeeze in  small segments starting outside the affected area, crossing over the affected area, and then to the other side of the affected area -Tapping:  fingertips tap with firm pressure over the affected area, can move around the affected area as well -Brushing/scratching:  use fingertips to brush/scratch over and around the affected area -Textures:  use soft and rough textured items like cotton balls vs wash cloths to brush or rub with light into firm pressure over and around the affected area  Repeat these 3-4x per day.   GOALS: Goals reviewed with patient? Yes  SHORT TERM GOALS: Target date: 04/08/2024  Pt will be independent and compliant with introductory strength and balance HEP in order to maintain functional progress and improve mobility. Baseline:  To be established. Goal status: INITIAL  2.  Pt will demonstrate adequate balance strategies to maintain upright with forward/overhead/and floor reaching. Baseline:  Very narrow limits of stability - several near falls related to this. Goal status: INITIAL  3.  Pt will be independent with desensitization strategies for LLE hypersensitivity management. Baseline: To be provided. Goal status: INITIAL  LONG TERM GOALS: Target date: 05/06/2024  Pt will be independent and compliant with advanced and finalized strength and balance HEP in order to maintain functional progress and improve mobility. Baseline: To be established. Goal status: INITIAL  2.  Patient will improve ABC Scale score to >/=76% in order to demonstrate decreased risk for falls and increased confidence in balance. Baseline: 66.875% (10/14) Goal status: INITIAL  3.  Patient will demonstrate floor recovery w/ no more than SBA in order to improve safety and home management. Baseline: Reports recent minA even w/ support surface Goal status: INITIAL  4.  Pt will decrease 5xSTS to </=12 seconds w/ improved midline mechanics in order to demonstrate decreased risk for falls and  improved functional bilateral LE strength and power. Baseline: 18.89 sec w/ heavy right trunk shortening and RUE reliance w/ L toe down vs full weight bearing Goal status: INITIAL  ASSESSMENT:  CLINICAL IMPRESSION: Focus of skilled session today on ongoing L ankle stability and active engagement of ankle musculature in prolonged L stance for various tasks.  He remains challenged with decline step throughs w/ shortened stride and burning reported in the L foot.  Continued to address functional core strength with more focus on standing activation to support static balance. Continue per POC.  OBJECTIVE IMPAIRMENTS: Abnormal gait, decreased activity tolerance, decreased balance, decreased cognition, decreased coordination, decreased endurance, decreased knowledge of use of DME, decreased strength, impaired sensation, impaired tone, improper body mechanics, and postural dysfunction.   ACTIVITY LIMITATIONS: carrying, lifting, bending, squatting, stairs, transfers, reach over head, and locomotion level  PARTICIPATION LIMITATIONS: laundry, shopping, and community activity  PERSONAL FACTORS: Age, Fitness, Past/current experiences, Time since onset of injury/illness/exacerbation, and 1-2 comorbidities: HTN, Afib are also affecting patient's functional outcome.   REHAB POTENTIAL: Good  CLINICAL DECISION MAKING: Stable/uncomplicated  EVALUATION COMPLEXITY: Moderate  PLAN:  PT FREQUENCY: 2x/week  PT DURATION: 8 weeks  PLANNED INTERVENTIONS: [id number]- PT Re-evaluation, 97750- Physical Performance Testing, 97110-Therapeutic exercises, 97530- Therapeutic activity, [id number]- Neuromuscular re-education, 97535- Self Care, [id number]- Manual therapy, [id number]- Gait training, [phone]- Orthotic Initial, [phone]- Orthotic/Prosthetic subsequent, [phone]- Aquatic Therapy, [phone]- Electrical stimulation (manual), Patient/Family education, Balance training, Stair training, Taping, Joint  mobilization, Vestibular training, and DME  instructions  PLAN FOR NEXT SESSION: Address orthotic if pt requests!  Floor recovery - incorporate tight space setup - repeat w/ full transition to bottom and incorporate LLE use to recover to standing - pt wants to work towards no UE support if possible (did discuss limitations and safety recs 11/20).  LLE NMR/strength/reaction time.  Expand strength and balance HEP.  Core strength - work mat table level on tasks to improve floor recovery from stomach and supine.  Reaching floor/forward/overhead. Perturbations/resisted gait/farmer's carry. Try ladder? Glut strengthening.  Pt felt challenged w/: Stairs/step taps/up; incline step through Standing 10lb kettlebell hip flexion LLE (isometric DF) Walking beanbag DF SLS taps to top step on L leg > L step ups Prolonged L stance 6 step w/ PT facilitating ankle posture Decline RLE step through working into lengthened stride 10 minutes on core at end of session - standing?  Pt declines offer of aquatic therapy at evaluation.  Daved KATHEE Bull, PT, DPT 04/25/2024, 4:52 PM

## 2024-04-27 ENCOUNTER — Encounter: Payer: Self-pay | Admitting: Physical Therapy

## 2024-04-27 ENCOUNTER — Ambulatory Visit: Admitting: Occupational Therapy

## 2024-04-27 ENCOUNTER — Ambulatory Visit: Admitting: Physical Therapy

## 2024-04-27 DIAGNOSIS — R278 Other lack of coordination: Secondary | ICD-10-CM | POA: Diagnosis not present

## 2024-04-27 DIAGNOSIS — I69354 Hemiplegia and hemiparesis following cerebral infarction affecting left non-dominant side: Secondary | ICD-10-CM | POA: Diagnosis not present

## 2024-04-27 DIAGNOSIS — R29818 Other symptoms and signs involving the nervous system: Secondary | ICD-10-CM

## 2024-04-27 DIAGNOSIS — M6281 Muscle weakness (generalized): Secondary | ICD-10-CM

## 2024-04-27 DIAGNOSIS — R208 Other disturbances of skin sensation: Secondary | ICD-10-CM

## 2024-04-27 DIAGNOSIS — R29898 Other symptoms and signs involving the musculoskeletal system: Secondary | ICD-10-CM

## 2024-04-27 DIAGNOSIS — R2681 Unsteadiness on feet: Secondary | ICD-10-CM

## 2024-04-27 DIAGNOSIS — R209 Unspecified disturbances of skin sensation: Secondary | ICD-10-CM | POA: Diagnosis not present

## 2024-04-27 NOTE — Therapy (Signed)
 OUTPATIENT PHYSICAL THERAPY NEURO TREATMENT   Patient Name: Donald [name] MRN: [id number] DOB:August 29, 1945, 78 y.o., male Today's Date: 04/27/2024   PCP: Donald Righter, MD REFERRING PROVIDER: Sherryl Bouchard, NP  END OF SESSION:  PT End of Session - 04/27/24 1535     Visit Number 15    Number of Visits 17   16 + eval   Date for Recertification  05/13/24   pushed out due to scheduling delay   Authorization Type Medicare A&B w/ [employer] supplement    Progress Note Due on Visit 10    PT Start Time 1535    PT Stop Time 1620    PT Time Calculation (min) 45 min    Equipment Utilized During Treatment Gait belt    Activity Tolerance Patient tolerated treatment well    Behavior During Therapy WFL for tasks assessed/performed          Past Medical History:  Diagnosis Date   Diabetes mellitus without complication (HCC)    Dysrhythmia    Hyperlipemia    Hypertension    large right middle cerebral artery infarct, 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 s/p IV tPA, b. source unknown, c. loop recorder placed   Snoring 07/03/2015   Past Surgical History:  Procedure Laterality Date   LOOP RECORDER IMPLANT  06/07/2013   MDT LinQ implanted by Dr Kelsie for cryptogenic stroke   LOOP RECORDER IMPLANT N/A 06/07/2013   Procedure: LOOP RECORDER IMPLANT;  Surgeon: Lynwood JONETTA Kelsie, MD;  Location: [facility] CATH [facility];  Service: Cardiovascular;  Laterality: N/A;   TEE WITHOUT CARDIOVERSION N/A 06/07/2013   Procedure: TRANSESOPHAGEAL ECHOCARDIOGRAM (TEE);  Surgeon: Leim VEAR Moose, MD;  Location: [facility] ENDOSCOPY;  Service: Cardiovascular;  Laterality: N/A;   TONSILLECTOMY     TRANSANAL HEMORRHOIDAL DEARTERIALIZATION N/A 02/18/2023   Procedure: TRANSANAL HEMORRHOIDAL DEARTERIALIZATION;  Surgeon: Debby Hila, MD;  Location: [facility] ORS;  Service: General;  Laterality: N/A;   UMBILICAL HERNIA REPAIR     Patient Active Problem List   Diagnosis Date Noted   Permanent atrial fibrillation (HCC) 05/17/2019    Acquired thrombophilia 05/17/2019   Snoring 07/03/2015   Persistent atrial fibrillation (HCC) 04/16/2015   Hypokalemia 09/20/2014   HTN (hypertension) 09/19/2014   HLD (hyperlipidemia) 09/19/2014   Diabetes mellitus without complication (HCC) 09/19/2014   Jerking 09/19/2014   Alterations of sensations, late effect of cerebrovascular disease 11/07/2013   Disturbances of vision, late effect of cerebrovascular disease 11/07/2013   Sinus bradycardia 08/08/2013   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HCC) 07/26/2013   Left-sided neglect 07/26/2013   Small vessel disease 06/07/2013   Obesity, unspecified 06/07/2013   Cerebral infarction (HCC) 06/07/2013   large right middle cerebral artery infarct, 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   Hypertension 06/03/2013   Diabetes (HCC) 06/03/2013    ONSET DATE: 06/03/2013 (CVA)  REFERRING DIAG: P30.645 (ICD-10-CM) - Hemiparesis affecting left side as late effect of cerebrovascular accident (HCC)  THERAPY DIAG:  Muscle weakness (generalized)  Other lack of coordination  Other disturbances of skin sensation  Other symptoms and signs involving the nervous system  Hemiplegia and hemiparesis following cerebral infarction affecting left non-dominant side (HCC)  Other symptoms and signs involving the musculoskeletal system  Unsteadiness on feet  Rationale for Evaluation and Treatment: Rehabilitation  SUBJECTIVE:  SUBJECTIVE STATEMENT: Donald [name]  Pt denies falls or acute status changes.  He denies pain.  Received following OT session.   Pt accompanied by: self (Wife - waiting in car)  PERTINENT HISTORY: R MCA infarct 06/03/2013 S/p tPA, HTN, DM2, Afib, had 1 seizure in 2017  PAIN:  Are you having pain? No  PRECAUTIONS: Fall and Other: loop recorder  RED  FLAGS: None   WEIGHT BEARING RESTRICTIONS: No  FALLS: Has patient fallen in last 6 months? Yes. Number of falls 3  LIVING ENVIRONMENT: Lives with: lives with their spouse Lives in: House/apartment Stairs: No Has following equipment at home: Single point cane  PLOF: Requires assistive device for independence  PATIENT GOALS: To work on reaction time and getting off the floor  OBJECTIVE:  Note: Objective measures were completed at Evaluation unless otherwise noted.  DIAGNOSTIC FINDINGS:  Brain MRI 09/20/2014 IMPRESSION: 1. No acute infarct. 2. Chronic right MCA territory infarct. 3. Subtle T2 hyperintensity in the left hippocampus and left temporal lobe cortex versus artifact. Recent seizure activity is a consideration.  COGNITION: Overall cognitive status: History of cognitive impairments - at baseline - reports quicker to anger/verbalize emotions/sometimes overstimulated with sounds and distractions   SENSATION: Light touch: WFL - LLE hypersensitive/question of allodynia  COORDINATION: Mildly dysmetric LLE  EDEMA:  None significant in BLE  MUSCLE TONE: LLE: Modifed Ashworth Scale 1+ = Slight increase in muscle tone, manifested by a catch, followed by minimal resistance throughout the remainder (less than half) of the ROM  POSTURE: rounded shoulders, flexed trunk , and weight shift right - mildly flexed trunk and right weight shift in standing and with ambulation, L shoulder more rounded than R w/ mild shoulder hike  LOWER EXTREMITY ROM:     Active  Right Eval Left Eval  Hip flexion WNL WFL  Hip extension    Hip abduction    Hip adduction    Hip internal rotation    Hip external rotation    Knee flexion    Knee extension    Ankle dorsiflexion  2+  Ankle plantarflexion    Ankle inversion    Ankle eversion     (Blank rows = not tested)  LOWER EXTREMITY MMT:    MMT Right Eval Left Eval  Hip flexion    Hip extension    Hip abduction    Hip adduction     Hip internal rotation    Hip external rotation    Knee flexion    Knee extension    Ankle dorsiflexion    Ankle plantarflexion    Ankle inversion    Ankle eversion    (Blank rows = not tested)  BED MOBILITY:  Findings: Sit to supine Complete Independence Supine to sit Complete Independence Rolling to Right Complete Independence Rolling to Left Complete Independence - needs increased time and momentum for L and R Ind w/ features of tempurpedic style bed  TRANSFERS: Sit to stand: SBA  Assistive device utilized: Single point cane     Stand to sit: SBA  Assistive device utilized: Single point cane     Chair to chair: SBA  Assistive device utilized: Single point cane       RAMP:  Not tested  CURB:  Not tested  STAIRS: Not tested GAIT: Findings: Distance walked: various clinic distances and Comments: Pt has mild right reliance w/ SPC, no foot scuff/toe catch even without AFO, shortened stride and slightly diminished flexion pattern of LLE  FUNCTIONAL TESTS:  5 times sit  to stand: 18.89 sec w/ heavy right trunk shortening and RUE reliance w/ L toe down vs full weight bearing  PATIENT SURVEYS:  ABC scale: The Activities-Specific Balance Confidence (ABC) Scale TBA                                                                                                                              TREATMENT DATE: 04-27-24  -L DF slide x115' w/ SPC and mod I, continued improved consistency of flicks; decreased size of beanbag by ~1 square inch > attempted stop and roll w/ LLE and 8lb slam ball - pt unable to flex limb adequately for repetition due to tone > LLE placed on ball for forward and backward rolls w/ minA for ankle control x2 rounds to fatigue (R hip primarily) -L lateral step ups (6 step w/ BUE support) to fatigue (pt has less leverage and lift w/ task); used lateral support strap for better positioning of L ankle in stance -Sitting EOM w/ L up and over gumdrop (~4 inch height)  2x20 -Lateral foot taps to 8 step 2 rounds to fatigue using SPC SBA  PATIENT EDUCATION: Education details:  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  Don't rest ankle in inverted positioning for prolonged periods.  Progress towards STGs (see below).  Discussed pt speaking to Dr. Carilyn or neurologist about possibly Botox to PF of L foot to help with inversion tone/synergistic movement.  Discussed synergistic pattern of left hemibody and working on shorter reps and active stretching of arm and trunk when noting 'C' shape developing in movement.  Discussed using trunk rotation movements to actively stretch left trunk (shorted in static sitting) and active weight shifting, reaching, and other stretching to do at home vs leaning on arm of couch for support on R side. Person educated: Patient Education method: Medical Illustrator Education comprehension: verbalized understanding and needs further education  HOME EXERCISE PROGRAM: Access Code: [facility] URL: [email or web address] Date: 03/21/2024 Prepared by: Daved Bull  Exercises - Seated Toe Raise  - 1-2 x daily - 4 x weekly - 2 sets - 10 reps - 2-3 seconds hold - Ankle Inversion Eversion Towel Slide  - 1-2 x daily - 4 x weekly - 2 sets - 10 reps - Seated Ankle Circles  - 1-2 x daily - 4 x weekly - 2 sets - 10 reps - Seated Heel Raise  - 1-2 x daily - 4 x weekly - 2 sets - 10 reps - 2-3 seconds hold - Seated Ankle Inversion Eversion PROM  - 1 x daily - 7 x weekly - 2 sets - 10 reps - Staggered Sit-to-Stand  - 1 x daily - 4-5 x weekly - 2-3 sets - 10 reps - Seated Hamstring Stretch  - 1 x daily - 4-5 x weekly - 1 sets - 3-4 reps - 45 seconds hold - Hooklying Single Knee to Chest Stretch with Towel  - 1 x daily -  4-5 x weekly - 1 sets - 3-4 reps - 45 seconds hold - Seated Toe Towel Scrunches  - 1 x daily - 4 x weekly - 1-2 sets - 10 reps - Seated Arch Lifts  - 1 x daily - 4 x weekly - 1-2 sets  - 10 reps  Desensitization Techniques: -Light touch/pressure:  using fingertip pressure to slowly move up small segments of the affected body part until outside the area of pain and then back to where you started -Deep pressure:  using fingertips to squeeze in small segments starting outside the affected area, crossing over the affected area, and then to the other side of the affected area -Tapping:  fingertips tap with firm pressure over the affected area, can move around the affected area as well -Brushing/scratching:  use fingertips to brush/scratch over and around the affected area -Textures:  use soft and rough textured items like cotton balls vs wash cloths to brush or rub with light into firm pressure over and around the affected area  Repeat these 3-4x per day.   GOALS: Goals reviewed with patient? Yes  SHORT TERM GOALS: Target date: 04/08/2024  Pt will be independent and compliant with introductory strength and balance HEP in order to maintain functional progress and improve mobility. Baseline:  IND and compliant (11/26) Goal status: MET  2.  Pt will demonstrate adequate balance strategies to maintain upright with forward/overhead/and floor reaching. Baseline:  Very narrow limits of stability - several near falls related to this; ongoing (11/26) Goal status: IN PROGRESS  3.  Pt will be independent with desensitization strategies for LLE hypersensitivity management. Baseline: Provided - pt has handout and actively working on in OT/PT (11/26) Goal status: MET  LONG TERM GOALS: Target date: 05/06/2024  Pt will be independent and compliant with advanced and finalized strength and balance HEP in order to maintain functional progress and improve mobility. Baseline: To be established. Goal status: INITIAL  2.  Patient will improve ABC Scale score to >/=76% in order to demonstrate decreased risk for falls and increased confidence in balance. Baseline: 66.875% (10/14) Goal status:  INITIAL  3.  Patient will demonstrate floor recovery w/ no more than SBA in order to improve safety and home management. Baseline: Reports recent minA even w/ support surface Goal status: INITIAL  4.  Pt will decrease 5xSTS to </=12 seconds w/ improved midline mechanics in order to demonstrate decreased risk for falls and improved functional bilateral LE strength and power. Baseline: 18.89 sec w/ heavy right trunk shortening and RUE reliance w/ L toe down vs full weight bearing Goal status: INITIAL  ASSESSMENT:  CLINICAL IMPRESSION: Focus of skilled session today on working further up the chain of the LLE to improve flexion mechanics.  He has difficulty w/ increased hip and knee flexion dynamically more than statically so time spent addressing mechanics in static R stance.  His left hip appears functionally weaker making lateral step ups challenging.  He would benefit from further work on left gait mechanics to promote fluidity of movement in combination with proper left ankle positioning as well as engagement of the left glut med for improved stance stability and ambulatory tolerance. Continue per POC.  OBJECTIVE IMPAIRMENTS: Abnormal gait, decreased activity tolerance, decreased balance, decreased cognition, decreased coordination, decreased endurance, decreased knowledge of use of DME, decreased strength, impaired sensation, impaired tone, improper body mechanics, and postural dysfunction.   ACTIVITY LIMITATIONS: carrying, lifting, bending, squatting, stairs, transfers, reach over head, and locomotion level  PARTICIPATION LIMITATIONS:  laundry, shopping, and community activity  PERSONAL FACTORS: Age, Fitness, Past/current experiences, Time since onset of injury/illness/exacerbation, and 1-2 comorbidities: HTN, Afib are also affecting patient's functional outcome.   REHAB POTENTIAL: Good  CLINICAL DECISION MAKING: Stable/uncomplicated  EVALUATION COMPLEXITY: Moderate  PLAN:  PT  FREQUENCY: 2x/week  PT DURATION: 8 weeks  PLANNED INTERVENTIONS: [id number]- PT Re-evaluation, 97750- Physical Performance Testing, 97110-Therapeutic exercises, 97530- Therapeutic activity, [id number]- Neuromuscular re-education, 97535- Self Care, [id number]- Manual therapy, [id number]- Gait training, [phone]- Orthotic Initial, [phone]- Orthotic/Prosthetic subsequent, [phone]- Aquatic Therapy, [phone]- Electrical stimulation (manual), Patient/Family education, Balance training, Stair training, Taping, Joint mobilization, Vestibular training, and DME instructions  PLAN FOR NEXT SESSION: Address orthotic if pt requests!  Floor recovery - incorporate tight space setup - repeat w/ full transition to bottom and incorporate LLE use to recover to standing - pt wants to work towards no UE support if possible (did discuss limitations and safety recs 11/20).  LLE NMR/strength/reaction time.  Expand strength and balance HEP.  Core strength - work mat table level on tasks to improve floor recovery from stomach and supine.  Reaching floor/forward/overhead. Perturbations/resisted gait/farmer's carry. Try ladder? Glut strengthening.  Pt felt challenged w/: Stairs/step taps/up; incline step through Standing 10lb kettlebell hip flexion LLE (isometric DF) Walking beanbag DF SLS taps to top step on L leg > L step ups Prolonged L stance 6 step w/ PT facilitating ankle posture Decline RLE step through working into lengthened stride 10 minutes on core at end of session - standing?  Pt declines offer of aquatic therapy at evaluation.  Daved KATHEE Bull, PT, DPT 04/27/2024, 5:33 PM

## 2024-04-27 NOTE — Therapy (Signed)
 OUTPATIENT OCCUPATIONAL THERAPY NEURO TREATMENT  Patient Name: Donald [name] MRN: [id number] DOB:[birth year], 78 y.o., male Today's Date: 04/27/2024  PCP: Kip Righter, MD REFERRING PROVIDER: Sherryl Bouchard, NP  END OF SESSION:  OT End of Session - 04/27/24 1457     Visit Number 8    Number of Visits 17    Date for Recertification  06/04/24    Authorization Type MCR A&B/AARP    Progress Note Due on Visit 10    OT Start Time 1449    Activity Tolerance Patient tolerated treatment well    Behavior During Therapy [facility] for tasks assessed/performed         Past Medical History:  Diagnosis Date   Diabetes mellitus without complication (HCC)    Dysrhythmia    Hyperlipemia    Hypertension    large right middle cerebral artery infarct, 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 s/p IV tPA, b. source unknown, c. loop recorder placed   Snoring 07/03/2015   Past Surgical History:  Procedure Laterality Date   LOOP RECORDER IMPLANT  06/07/2013   MDT LinQ implanted by Dr Kelsie for cryptogenic stroke   LOOP RECORDER IMPLANT N/A 06/07/2013   Procedure: LOOP RECORDER IMPLANT;  Surgeon: Lynwood JONETTA Kelsie, MD;  Location: [facility] CATH [facility];  Service: Cardiovascular;  Laterality: N/A;   TEE WITHOUT CARDIOVERSION N/A 06/07/2013   Procedure: TRANSESOPHAGEAL ECHOCARDIOGRAM (TEE);  Surgeon: Leim VEAR Moose, MD;  Location: [facility] ENDOSCOPY;  Service: Cardiovascular;  Laterality: N/A;   TONSILLECTOMY     TRANSANAL HEMORRHOIDAL DEARTERIALIZATION N/A 02/18/2023   Procedure: TRANSANAL HEMORRHOIDAL DEARTERIALIZATION;  Surgeon: Debby Hila, MD;  Location: [facility] ORS;  Service: General;  Laterality: N/A;   UMBILICAL HERNIA REPAIR     Patient Active Problem List   Diagnosis Date Noted   Permanent atrial fibrillation (HCC) 05/17/2019   Acquired thrombophilia 05/17/2019   Snoring 07/03/2015   Persistent atrial fibrillation (HCC) 04/16/2015   Hypokalemia 09/20/2014   HTN (hypertension) 09/19/2014   HLD (hyperlipidemia)  09/19/2014   Diabetes mellitus without complication (HCC) 09/19/2014   Jerking 09/19/2014   Alterations of sensations, late effect of cerebrovascular disease 11/07/2013   Disturbances of vision, late effect of cerebrovascular disease 11/07/2013   Sinus bradycardia 08/08/2013   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HCC) 07/26/2013   Left-sided neglect 07/26/2013   Small vessel disease 06/07/2013   Obesity, unspecified 06/07/2013   Cerebral infarction (HCC) 06/07/2013   large right middle cerebral artery infarct, 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   Hypertension 06/03/2013   Diabetes (HCC) 06/03/2013   ONSET DATE: 06/03/2013 (CVA), 03/15/2024 referral date  REFERRING DIAG: P30.645 (ICD-10-CM) - Hemiplegia and hemiparesis following cerebral infarction affecting left non-dominant side (HCC)  THERAPY DIAG:  Muscle weakness (generalized)  Other lack of coordination  Other disturbances of skin sensation  Other symptoms and signs involving the nervous system  Hemiplegia and hemiparesis following cerebral infarction affecting left non-dominant side (HCC)  Other symptoms and signs involving the musculoskeletal system  Rationale for Evaluation and Treatment: Rehabilitation  SUBJECTIVE:   SUBJECTIVE STATEMENT: Pt reports he has been able to hold onto the stair rail in PT with L hand with less discomfort.   Pt accompanied by: self  PERTINENT HISTORY: R MCA infarct 06/03/2013 S/p tPA, HTN, DM2, Afib, had 1 seizure in 2017   DIAGNOSTIC FINDINGS:  Brain MRI 09/20/2014 IMPRESSION: 1. No acute infarct. 2. Chronic right MCA territory infarct. 3. Subtle T2 hyperintensity in the left hippocampus and left temporal lobe cortex versus artifact. Recent seizure activity is a consideration  PRECAUTIONS: Fall and Other: loop recorder, L sided weakness  WEIGHT BEARING RESTRICTIONS: No  PAIN:  Are you having pain? No  FALLS: Has patient fallen in last 6 months? Yes. Number of falls 4 in the last  year  LIVING ENVIRONMENT: Lives with: lives with their spouse Lives in: House/apartment 1 level Stairs: No Has following equipment at home: Single point cane, shower chair/bench with back, walk in shower  PLOF: Requires assistive device for independence  PATIENT GOALS: I'd like to be able to use my left hand more functionally. I want to improve my coordination and reaction time.  OBJECTIVE:  Note: Objective measures were completed at Evaluation unless otherwise noted.  HAND DOMINANCE: Right  ADLs: Overall ADLs: Independent  Equipment: Shower seat with back and Walk in shower  IADLs: Cooks and cleans bathrooms and kitchen Shopping:  Light housekeeping: Independent Meal Prep: Independent Community mobility: Wife completes  Medication management: Independent Landscape architect: Independent Handwriting: did not assess d/t nondominant L hand being affected  MOBILITY STATUS: ambulates with SPC  POSTURE COMMENTS:  rounded shoulders, flexed trunk , and weight shift right - mildly flexed trunk and right weight shift in standing and with ambulation, L shoulder more rounded than R w/ mild shoulder hike   ACTIVITY TOLERANCE: Activity tolerance: no changes  FUNCTIONAL OUTCOME MEASURES: PSFS:   UPPER EXTREMITY ROM:  WNL   UPPER EXTREMITY MMT:   4-/5 shoulder   HAND FUNCTION: Grip strength: Right: 73.1 lbs; Left: 58.2 lbs  COORDINATION: 9 Hole Peg test: Right: 26.32 sec; Left: 65.08 sec Box and Blocks:  Right 49 blocks, Left 29 blocks  SENSATION: Reports hypersensitivity in fingertips  EDEMA: Pt reports hx of edema but it is rare now.  MUSCLE TONE: LUE: Modifed Ashworth Scale 1 = Slight increase in muscle tone, manifested by a catch and release or by minimal resistance at the end of the range of motion when the affected part(s) is moved in flexion or extension  COGNITION: Overall cognitive status: Within functional limits for tasks assessed  VISION: Subjective  report: Pt reports having small cataracts but it is not affecting vision at this time Baseline vision: Wears glasses all the time Visual history: cataracts well kept  VISION ASSESSMENT: To be further assessed in functional context  PERCEPTION: WFL  PRAXIS: WFL  OBSERVATIONS: Impaired FM coordination, grip strength, strength in L hand and shoulder, hypersensitivity in fingertips of L hand                                                                                                                           TREATMENT:   Wrist flex and ext with tan (12.5 lbs) flex bar x 2 min for strength and endurance of affected extremity  Supination with tan (12.5 lbs) flex bar x 2 min for strength and endurance of affected extremity  Pronation with tan (12.5 lbs) flex bar x 2 min for strength and endurance of affected extremity  Pt removed Squigz, suction  toys of various sizes and shapes, from mirror using affected LUE for improved strength, ROM, and overall functional use.    Patient participated in 3 games of Connect 4 requiring minimal verbal cues for proper play and no verbal cues to only use affected left hand with manipulation of game pieces for fine motor coordination, gross motor coordination, upper extremity range of motion, and endurance/stamina.   PATIENT EDUCATION: Education details: LUE functional use Person educated: Patient Education method: Explanation, Demonstration, Tactile cues, and Verbal cues Education comprehension: verbalized understanding, returned demonstration, verbal cues required, and tactile cues required  HOME EXERCISE PROGRAM: 04/04/24: Desensitization 04/07/24: Putty, coordination 04/14/24: Golf solitaire 11.17.25: Access Code: [email or web address] URL: [email or web address] Date: 04/18/2024 Prepared by: Rocky Dutch  Exercises - Standing Shoulder Horizontal Abduction with Resistance  - 1 x daily - 5 x weekly - 2 sets - 15 reps - Standing Shoulder  Abduction with Self-Anchored Resistance  - 1 x daily - 5 x weekly - 2 sets - 15 reps - Standing Shoulder Flexion with Self-Anchored Resistance  - 1 x daily - 5 x weekly - 2 sets - 15 reps - Seated Shoulder Extension with Resistance  - 1 x daily - 5 x weekly - 2 sets - 10 reps   GOALS: Goals reviewed with patient? Yes  SHORT TERM GOALS: Target date: 05/04/24  Pt will be independent with HEP completion for coordination and strengthening Baseline: New to OP OT Goal status: IN PROGRESS  2.  Pt will be independent with desensitization techniques Baseline: Educated  Goal status: IN PROGRESS  3.  Pt will increase L shoulder strength to 4/5 Baseline: 4-/5  Goal status:  IN PROGRESS  4.  Pt will demonstrate improved coordination in L hand by a score of 34 blocks in Box and Blocks Test Baseline: Right 49 blocks, Left 29 blocks Goal status:  IN PROGRESS  LONG TERM GOALS: Target date: 06/04/24  Pt will demonstrate an increase of at least 2.0 score of average PSFS score or a 2 point increase in individual activity score in PSFS Baseline:  Goal status:  IN PROGRESS  2.  Pt will demonstrate improved L hand FM coordination by a score of 60 seconds or less in 9HPT Baseline: Right: 26.32 sec; Left: 65.08 sec Goal status:  IN PROGRESS  3.  Pt will demonstrate improved grip strength in L hand by scoring at least 65 pounds Baseline: Right: 73.1 lbs; Left: 58.2 lbs Goal status:  IN PROGRESS  4.  Pt will demonstrate improved L coordination by a score of at least 39 blocks on Box and Blocks test Baseline: 29 blocks Goal status:  IN PROGRESS   ASSESSMENT:  CLINICAL IMPRESSION: Patient demonstrating improvements with LUE fine and gross motor coordination, strength, and sensation as needed to improve overall functional use following chronic stroke. Continue to progress towards remaining goals.   PERFORMANCE DEFICITS: in functional skills including IADLs, coordination, dexterity, sensation,  strength, Fine motor control, Gross motor control, decreased knowledge of precautions, and UE functional use, cognitive skills including safety awareness, and psychosocial skills including coping strategies and environmental adaptation.   IMPAIRMENTS: are limiting patient from IADLs, work, and leisure.   CO-MORBIDITIES: may have co-morbidities  that affects occupational performance. Patient will benefit from skilled OT to address above impairments and improve overall function  REHAB POTENTIAL: Good  PLAN:  OT FREQUENCY: 2x/week  OT DURATION: 8 weeks  PLANNED INTERVENTIONS: 97168 OT Re-evaluation, 97535 self care/ADL training, 02889 therapeutic exercise, 97530 therapeutic activity, 97112 neuromuscular re-education,  passive range of motion, psychosocial skills training, coping strategies training, patient/family education, and DME and/or AE instructions  RECOMMENDED OTHER SERVICES: none at this time  CONSULTED AND AGREED WITH PLAN OF CARE: Patient  PLAN FOR NEXT SESSION:  Coordination activities- gross motor activities (soccer games - see Xayden Linsey's note) Blazepods Strengthening activities L shoulder and hand F/u L shoulder HEP   Jocelyn CHRISTELLA Bottom, OT 04/27/2024, 3:02 PM

## 2024-05-02 ENCOUNTER — Encounter: Payer: Self-pay | Admitting: Physical Therapy

## 2024-05-02 ENCOUNTER — Ambulatory Visit: Attending: Adult Health | Admitting: Physical Therapy

## 2024-05-02 ENCOUNTER — Ambulatory Visit

## 2024-05-02 DIAGNOSIS — R29818 Other symptoms and signs involving the nervous system: Secondary | ICD-10-CM | POA: Insufficient documentation

## 2024-05-02 DIAGNOSIS — R278 Other lack of coordination: Secondary | ICD-10-CM | POA: Insufficient documentation

## 2024-05-02 DIAGNOSIS — R29898 Other symptoms and signs involving the musculoskeletal system: Secondary | ICD-10-CM | POA: Diagnosis present

## 2024-05-02 DIAGNOSIS — M6281 Muscle weakness (generalized): Secondary | ICD-10-CM | POA: Diagnosis present

## 2024-05-02 DIAGNOSIS — R208 Other disturbances of skin sensation: Secondary | ICD-10-CM

## 2024-05-02 DIAGNOSIS — I69354 Hemiplegia and hemiparesis following cerebral infarction affecting left non-dominant side: Secondary | ICD-10-CM | POA: Insufficient documentation

## 2024-05-02 DIAGNOSIS — R209 Unspecified disturbances of skin sensation: Secondary | ICD-10-CM | POA: Diagnosis present

## 2024-05-02 DIAGNOSIS — R2681 Unsteadiness on feet: Secondary | ICD-10-CM | POA: Insufficient documentation

## 2024-05-02 NOTE — Therapy (Signed)
 OUTPATIENT OCCUPATIONAL THERAPY NEURO TREATMENT  Patient Name: Donald [name] MRN: [id number] DOB:03-29-1946, 78 y.o., male Today's Date: 05/02/2024  PCP: Kip Righter, MD REFERRING PROVIDER: Sherryl Bouchard, NP  END OF SESSION:  OT End of Session - 05/02/24 1403     Visit Number 9    Number of Visits 17    Date for Recertification  06/04/24    Authorization Type MCR A&B/AARP    Progress Note Due on Visit 10    OT Start Time 1404    OT Stop Time 1445    OT Time Calculation (min) 41 min    Equipment Utilized During Treatment soccer band game, 4# dowel rod and bouncy ball, resistive push pins    Activity Tolerance Patient tolerated treatment well         Past Medical History:  Diagnosis Date   Diabetes mellitus without complication (HCC)    Dysrhythmia    Hyperlipemia    Hypertension    large right middle cerebral artery infarct, 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 s/p IV tPA, b. source unknown, c. loop recorder placed   Snoring 07/03/2015   Past Surgical History:  Procedure Laterality Date   LOOP RECORDER IMPLANT  06/07/2013   MDT LinQ implanted by Dr Kelsie for cryptogenic stroke   LOOP RECORDER IMPLANT N/A 06/07/2013   Procedure: LOOP RECORDER IMPLANT;  Surgeon: Lynwood JONETTA Kelsie, MD;  Location: [facility] CATH LAB;  Service: Cardiovascular;  Laterality: N/A;   TEE WITHOUT CARDIOVERSION N/A 06/07/2013   Procedure: TRANSESOPHAGEAL ECHOCARDIOGRAM (TEE);  Surgeon: Leim VEAR Moose, MD;  Location: [facility] ENDOSCOPY;  Service: Cardiovascular;  Laterality: N/A;   TONSILLECTOMY     TRANSANAL HEMORRHOIDAL DEARTERIALIZATION N/A 02/18/2023   Procedure: TRANSANAL HEMORRHOIDAL DEARTERIALIZATION;  Surgeon: Debby Hila, MD;  Location: [facility] ORS;  Service: General;  Laterality: N/A;   UMBILICAL HERNIA REPAIR     Patient Active Problem List   Diagnosis Date Noted   Permanent atrial fibrillation (HCC) 05/17/2019   Acquired thrombophilia 05/17/2019   Snoring 07/03/2015   Persistent atrial fibrillation  (HCC) 04/16/2015   Hypokalemia 09/20/2014   HTN (hypertension) 09/19/2014   HLD (hyperlipidemia) 09/19/2014   Diabetes mellitus without complication (HCC) 09/19/2014   Jerking 09/19/2014   Alterations of sensations, late effect of cerebrovascular disease 11/07/2013   Disturbances of vision, late effect of cerebrovascular disease 11/07/2013   Sinus bradycardia 08/08/2013   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HCC) 07/26/2013   Left-sided neglect 07/26/2013   Small vessel disease 06/07/2013   Obesity, unspecified 06/07/2013   Cerebral infarction (HCC) 06/07/2013   large right middle cerebral artery infarct, 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 I make myself use the L hand when I would've actively avoided it before.   Pt accompanied by: self  PERTINENT HISTORY: R MCA infarct 06/03/2013 S/p tPA, HTN, DM2, Afib, had 1 seizure in 2017   DIAGNOSTIC FINDINGS:  Brain MRI 09/20/2014 IMPRESSION: 1. No acute infarct. 2. Chronic right MCA territory infarct. 3. Subtle T2 hyperintensity in the left hippocampus and left temporal lobe  cortex versus artifact. Recent seizure activity is a consideration  PRECAUTIONS: Fall and Other: loop recorder, L sided weakness  WEIGHT BEARING RESTRICTIONS: No  PAIN:  Are you having pain? No  FALLS: Has patient fallen in last 6 months? Yes. Number of falls 4 in the last year  LIVING ENVIRONMENT: Lives with:  lives with their spouse Lives in: House/apartment 1 level Stairs: No Has following equipment at home: Single point cane, shower chair/bench with back, walk in shower  PLOF: Requires assistive device for independence  PATIENT GOALS: I'd like to be able to use my left hand more functionally. I want to improve my coordination and reaction time.  OBJECTIVE:  Note: Objective measures were completed at Evaluation unless otherwise noted.  HAND DOMINANCE: Right  ADLs: Overall ADLs: Independent  Equipment: Shower seat with back and Walk in shower  IADLs: Cooks and cleans bathrooms and kitchen Shopping:  Light housekeeping: Independent Meal Prep: Independent Community mobility: Wife completes  Medication management: Independent Landscape architect: Independent Handwriting: did not assess d/t nondominant L hand being affected  MOBILITY STATUS: ambulates with SPC  POSTURE COMMENTS:  rounded shoulders, flexed trunk , and weight shift right - mildly flexed trunk and right weight shift in standing and with ambulation, L shoulder more rounded than R w/ mild shoulder hike   ACTIVITY TOLERANCE: Activity tolerance: no changes  FUNCTIONAL OUTCOME MEASURES: PSFS:   UPPER EXTREMITY ROM:  WNL   UPPER EXTREMITY MMT:   4-/5 shoulder   HAND FUNCTION: Grip strength: Right: 73.1 lbs; Left: 58.2 lbs  COORDINATION: 9 Hole Peg test: Right: 26.32 sec; Left: 65.08 sec Box and Blocks:  Right 49 blocks, Left 29 blocks  SENSATION: Reports hypersensitivity in fingertips  EDEMA: Pt reports hx of edema but it is rare now.  MUSCLE TONE: LUE: Modifed Ashworth Scale 1 = Slight increase in muscle tone, manifested by a catch and release or by minimal resistance at the end of the range of motion when the affected part(s) is moved in flexion or extension  COGNITION: Overall cognitive status: Within functional limits for tasks assessed  VISION: Subjective report: Pt reports having small cataracts  but it is not affecting vision at this time Baseline vision: Wears glasses all the time Visual history: cataracts well kept  VISION ASSESSMENT: To be further assessed in functional context  PERCEPTION: WFL  PRAXIS: WFL  OBSERVATIONS: Impaired FM coordination, grip strength, strength in L hand and shoulder, hypersensitivity in fingertips of L hand                                                                                                                           TREATMENT: 05/02/24 Pt participated in therapeutic activities honing tip to tip pinch pulling string to participate in soccer board game to simulate strumming guitar string with L hand. Pt next completed with 4# dowel shoulder/elbow extension/flexion hitting moving target. With same rod completed 20 reps of shoulder reverse rows and shoulder extension with green  theraband around rod for increased resistance.   Next pt participated in therapeutic activities stringing and unstringing beads from textured string with L hand. Pt finally completed 3 point pinch inserting and removing resistive clothespins (1-8# resistance) to/from lines of varying thicknesses.  PATIENT EDUCATION: Education details: Proper form of pinching for resistive pin activity Person educated: Patient Education method: Explanation, Demonstration, Tactile cues, and Verbal cues Education comprehension: verbalized understanding, returned demonstration, verbal cues required, and tactile cues required  HOME EXERCISE PROGRAM: 04/04/24: Desensitization 04/07/24: Putty, coordination 04/14/24: Golf solitaire 11.17.25: Access Code: [email or web address] URL: [email or web address] Date: 04/18/2024 Prepared by: Rocky Dutch  Exercises - Standing Shoulder Horizontal Abduction with Resistance  - 1 x daily - 5 x weekly - 2 sets - 15 reps - Standing Shoulder Abduction with Self-Anchored Resistance  - 1 x daily - 5 x weekly - 2 sets - 15 reps - Standing Shoulder Flexion  with Self-Anchored Resistance  - 1 x daily - 5 x weekly - 2 sets - 15 reps - Seated Shoulder Extension with Resistance  - 1 x daily - 5 x weekly - 2 sets - 10 reps   GOALS: Goals reviewed with patient? Yes  SHORT TERM GOALS: Target date: 05/04/24  Pt will be independent with HEP completion for coordination and strengthening Baseline: New to OP OT Goal status: IN PROGRESS  2.  Pt will be independent with desensitization techniques Baseline: Educated  Goal status: IN PROGRESS  3.  Pt will increase L shoulder strength to 4/5 Baseline: 4-/5  Goal status:  IN PROGRESS  4.  Pt will demonstrate improved coordination in L hand by a score of 34 blocks in Box and Blocks Test Baseline: Right 49 blocks, Left 29 blocks Goal status:  IN PROGRESS  LONG TERM GOALS: Target date: 06/04/24  Pt will demonstrate an increase of at least 2.0 score of average PSFS score or a 2 point increase in individual activity score in PSFS Baseline:  Goal status:  IN PROGRESS  2.  Pt will demonstrate improved L hand FM coordination by a score of 60 seconds or less in 9HPT Baseline: Right: 26.32 sec; Left: 65.08 sec Goal status:  IN PROGRESS  3.  Pt will demonstrate improved grip strength in L hand by scoring at least 65 pounds Baseline: Right: 73.1 lbs; Left: 58.2 lbs Goal status:  IN PROGRESS  4.  Pt will demonstrate improved L coordination by a score of at least 39 blocks on Box and Blocks test Baseline: 29 blocks Goal status:  IN PROGRESS   ASSESSMENT:  CLINICAL IMPRESSION: Patient demonstrating improvements with LUE fine and gross motor coordination, strength, and sensation as needed to improve overall functional use following chronic stroke. Continue to progress towards remaining goals.   PERFORMANCE DEFICITS: in functional skills including IADLs, coordination, dexterity, sensation, strength, Fine motor control, Gross motor control, decreased knowledge of precautions, and UE functional use, cognitive  skills including safety awareness, and psychosocial skills including coping strategies and environmental adaptation.   IMPAIRMENTS: are limiting patient from IADLs, work, and leisure.   CO-MORBIDITIES: may have co-morbidities  that affects occupational performance. Patient will benefit from skilled OT to address above impairments and improve overall function  REHAB POTENTIAL: Good  PLAN:  OT FREQUENCY: 2x/week  OT DURATION: 8 weeks  PLANNED INTERVENTIONS: 97168 OT Re-evaluation, 97535 self care/ADL training, 02889 therapeutic exercise, 97530 therapeutic activity, 97112 neuromuscular re-education, passive range of motion, psychosocial skills training, coping strategies training, patient/family education, and DME and/or AE instructions  RECOMMENDED OTHER  SERVICES: none at this time  CONSULTED AND AGREED WITH PLAN OF CARE: Patient  PLAN FOR NEXT SESSION: Progress note Coordination activities- gross motor activities  Blazepods Strengthening activities L shoulder and hand Activities reducing sensitivity in L fingertips   Loreda Silverio, OT 05/02/2024, 3:00 PM

## 2024-05-02 NOTE — Therapy (Signed)
 OUTPATIENT PHYSICAL THERAPY NEURO TREATMENT   Patient Name: Donald [name] MRN: [id number] DOB:Aug 13, 1945, 78 y.o., male Today's Date: 05/02/2024   PCP: Kip Righter, MD REFERRING PROVIDER: Sherryl Bouchard, NP  END OF SESSION:  PT End of Session - 05/02/24 1459     Visit Number 16    Number of Visits 17   16 + eval   Date for Recertification  05/13/24   pushed out due to scheduling delay   Authorization Type Medicare A&B w/ [employer] supplement    Progress Note Due on Visit 10    PT Start Time 1457    PT Stop Time 1541    PT Time Calculation (min) 44 min    Equipment Utilized During Treatment Gait belt    Activity Tolerance Patient tolerated treatment well    Behavior During Therapy WFL for tasks assessed/performed          Past Medical History:  Diagnosis Date   Diabetes mellitus without complication (HCC)    Dysrhythmia    Hyperlipemia    Hypertension    large right middle cerebral artery infarct, 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 s/p IV tPA, b. source unknown, c. loop recorder placed   Snoring 07/03/2015   Past Surgical History:  Procedure Laterality Date   LOOP RECORDER IMPLANT  06/07/2013   MDT LinQ implanted by Dr Kelsie for cryptogenic stroke   LOOP RECORDER IMPLANT N/A 06/07/2013   Procedure: LOOP RECORDER IMPLANT;  Surgeon: Lynwood JONETTA Kelsie, MD;  Location: [facility] CATH LAB;  Service: Cardiovascular;  Laterality: N/A;   TEE WITHOUT CARDIOVERSION N/A 06/07/2013   Procedure: TRANSESOPHAGEAL ECHOCARDIOGRAM (TEE);  Surgeon: Leim VEAR Moose, MD;  Location: [facility] ENDOSCOPY;  Service: Cardiovascular;  Laterality: N/A;   TONSILLECTOMY     TRANSANAL HEMORRHOIDAL DEARTERIALIZATION N/A 02/18/2023   Procedure: TRANSANAL HEMORRHOIDAL DEARTERIALIZATION;  Surgeon: Debby Hila, MD;  Location: [facility] ORS;  Service: General;  Laterality: N/A;   UMBILICAL HERNIA REPAIR     Patient Active Problem List   Diagnosis Date Noted   Permanent atrial fibrillation (HCC) 05/17/2019    Acquired thrombophilia 05/17/2019   Snoring 07/03/2015   Persistent atrial fibrillation (HCC) 04/16/2015   Hypokalemia 09/20/2014   HTN (hypertension) 09/19/2014   HLD (hyperlipidemia) 09/19/2014   Diabetes mellitus without complication (HCC) 09/19/2014   Jerking 09/19/2014   Alterations of sensations, late effect of cerebrovascular disease 11/07/2013   Disturbances of vision, late effect of cerebrovascular disease 11/07/2013   Sinus bradycardia 08/08/2013   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HCC) 07/26/2013   Left-sided neglect 07/26/2013   Small vessel disease 06/07/2013   Obesity, unspecified 06/07/2013   Cerebral infarction (HCC) 06/07/2013   large right middle cerebral artery infarct, 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   Hypertension 06/03/2013   Diabetes (HCC) 06/03/2013    ONSET DATE: 06/03/2013 (CVA)  REFERRING DIAG: P30.645 (ICD-10-CM) - Hemiparesis affecting left side as late effect of cerebrovascular accident (HCC)  THERAPY DIAG:  Muscle weakness (generalized)  Other lack of coordination  Other disturbances of skin sensation  Other symptoms and signs involving the nervous system  Hemiplegia and hemiparesis following cerebral infarction affecting left non-dominant side (HCC)  Other symptoms and signs involving the musculoskeletal system  Unsteadiness on feet  Rationale for Evaluation and Treatment: Rehabilitation  SUBJECTIVE:  SUBJECTIVE STATEMENT: [name]  Pt denies falls or acute status changes.  He denies pain.  Received following OT session.  He had a nice Thanksgiving with no mobility issues or near falls.  Endurance with activity is still limited partially due to UE hypersensitivity.  He sees Dr. Irving on 12/8 or 12/9 for orthopedic follow-up. Pt accompanied by: self (Wife  - waiting in car)  PERTINENT HISTORY: R MCA infarct 06/03/2013 S/p tPA, HTN, DM2, Afib, had 1 seizure in 2017  PAIN:  Are you having pain? No  PRECAUTIONS: Fall and Other: loop recorder  RED FLAGS: None   WEIGHT BEARING RESTRICTIONS: No  FALLS: Has patient fallen in last 6 months? Yes. Number of falls 3  LIVING ENVIRONMENT: Lives with: lives with their spouse Lives in: House/apartment Stairs: No Has following equipment at home: Single point cane  PLOF: Requires assistive device for independence  PATIENT GOALS: To work on reaction time and getting off the floor  OBJECTIVE:  Note: Objective measures were completed at Evaluation unless otherwise noted.  DIAGNOSTIC FINDINGS:  Brain MRI 09/20/2014 IMPRESSION: 1. No acute infarct. 2. Chronic right MCA territory infarct. 3. Subtle T2 hyperintensity in the left hippocampus and left temporal lobe cortex versus artifact. Recent seizure activity is a consideration.  COGNITION: Overall cognitive status: History of cognitive impairments - at baseline - reports quicker to anger/verbalize emotions/sometimes overstimulated with sounds and distractions   SENSATION: Light touch: WFL - LLE hypersensitive/question of allodynia  COORDINATION: Mildly dysmetric LLE  EDEMA:  None significant in BLE  MUSCLE TONE: LLE: Modifed Ashworth Scale 1+ = Slight increase in muscle tone, manifested by a catch, followed by minimal resistance throughout the remainder (less than half) of the ROM  POSTURE: rounded shoulders, flexed trunk , and weight shift right - mildly flexed trunk and right weight shift in standing and with ambulation, L shoulder more rounded than R w/ mild shoulder hike  LOWER EXTREMITY ROM:     Active  Right Eval Left Eval  Hip flexion WNL WFL  Hip extension    Hip abduction    Hip adduction    Hip internal rotation    Hip external rotation    Knee flexion    Knee extension    Ankle dorsiflexion  2+  Ankle  plantarflexion    Ankle inversion    Ankle eversion     (Blank rows = not tested)  LOWER EXTREMITY MMT:    MMT Right Eval Left Eval  Hip flexion    Hip extension    Hip abduction    Hip adduction    Hip internal rotation    Hip external rotation    Knee flexion    Knee extension    Ankle dorsiflexion    Ankle plantarflexion    Ankle inversion    Ankle eversion    (Blank rows = not tested)  BED MOBILITY:  Findings: Sit to supine Complete Independence Supine to sit Complete Independence Rolling to Right Complete Independence Rolling to Left Complete Independence - needs increased time and momentum for L and R Ind w/ features of tempurpedic style bed  TRANSFERS: Sit to stand: SBA  Assistive device utilized: Single point cane     Stand to sit: SBA  Assistive device utilized: Single point cane     Chair to chair: SBA  Assistive device utilized: Single point cane       RAMP:  Not tested  CURB:  Not tested  STAIRS: Not tested GAIT: Findings: Distance walked:  various clinic distances and Comments: Pt has mild right reliance w/ SPC, no foot scuff/toe catch even without AFO, shortened stride and slightly diminished flexion pattern of LLE  FUNCTIONAL TESTS:  5 times sit to stand: 18.89 sec w/ heavy right trunk shortening and RUE reliance w/ L toe down vs full weight bearing  PATIENT SURVEYS:  ABC scale: The Activities-Specific Balance Confidence (ABC) Scale TBA                                                                                                                              TREATMENT DATE: 05-02-24 -10lb left farmer's carry working on step through for left toe off x 345' w/ SPC -Resisted gait x345 w/ SPC SBA -Wedge STS progress to no UE support over x15 reps -L lateral step ups (6 step w/ BUE support) 2x5 (pt has less leverage and lift w/ task reporting LUE sensitivity); used lateral support strap for better positioning of L ankle in stance -Ramp step  throughs in L stance to fatigue (~9 reps) w/ [facility]  PATIENT EDUCATION: Education details:  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  Don't rest ankle in inverted positioning for prolonged periods.  Discussed re-cert next visit w/ plan to change frequency to 2x/wk for 4 wks. Person educated: Patient Education method: Medical Illustrator Education comprehension: verbalized understanding and needs further education  HOME EXERCISE PROGRAM: Access Code: [facility] URL: [email or web address] Date: 03/21/2024 Prepared by: Daved Bull  Exercises - Seated Toe Raise  - 1-2 x daily - 4 x weekly - 2 sets - 10 reps - 2-3 seconds hold - Ankle Inversion Eversion Towel Slide  - 1-2 x daily - 4 x weekly - 2 sets - 10 reps - Seated Ankle Circles  - 1-2 x daily - 4 x weekly - 2 sets - 10 reps - Seated Heel Raise  - 1-2 x daily - 4 x weekly - 2 sets - 10 reps - 2-3 seconds hold - Seated Ankle Inversion Eversion PROM  - 1 x daily - 7 x weekly - 2 sets - 10 reps - Staggered Sit-to-Stand  - 1 x daily - 4-5 x weekly - 2-3 sets - 10 reps - Seated Hamstring Stretch  - 1 x daily - 4-5 x weekly - 1 sets - 3-4 reps - 45 seconds hold - Hooklying Single Knee to Chest Stretch with Towel  - 1 x daily - 4-5 x weekly - 1 sets - 3-4 reps - 45 seconds hold - Seated Toe Towel Scrunches  - 1 x daily - 4 x weekly - 1-2 sets - 10 reps - Seated Arch Lifts  - 1 x daily - 4 x weekly - 1-2 sets - 10 reps  Desensitization Techniques: -Light touch/pressure:  using fingertip pressure to slowly move up small segments of the affected body part until outside the area of pain and then back to where you started -Deep pressure:  using fingertips  to squeeze in small segments starting outside the affected area, crossing over the affected area, and then to the other side of the affected area -Tapping:  fingertips tap with firm pressure over the affected area, can move around the affected area as  well -Brushing/scratching:  use fingertips to brush/scratch over and around the affected area -Textures:  use soft and rough textured items like cotton balls vs wash cloths to brush or rub with light into firm pressure over and around the affected area  Repeat these 3-4x per day.   GOALS: Goals reviewed with patient? Yes  SHORT TERM GOALS: Target date: 04/08/2024  Pt will be independent and compliant with introductory strength and balance HEP in order to maintain functional progress and improve mobility. Baseline:  IND and compliant (11/26) Goal status: MET  2.  Pt will demonstrate adequate balance strategies to maintain upright with forward/overhead/and floor reaching. Baseline:  Very narrow limits of stability - several near falls related to this; ongoing (11/26) Goal status: IN PROGRESS  3.  Pt will be independent with desensitization strategies for LLE hypersensitivity management. Baseline: Provided - pt has handout and actively working on in OT/PT (11/26) Goal status: MET  LONG TERM GOALS: Target date: 05/06/2024  Pt will be independent and compliant with advanced and finalized strength and balance HEP in order to maintain functional progress and improve mobility. Baseline: To be established. Goal status: INITIAL  2.  Patient will improve ABC Scale score to >/=76% in order to demonstrate decreased risk for falls and increased confidence in balance. Baseline: 66.875% (10/14) Goal status: INITIAL  3.  Patient will demonstrate floor recovery w/ no more than SBA in order to improve safety and home management. Baseline: Reports recent minA even w/ support surface Goal status: INITIAL  4.  Pt will decrease 5xSTS to </=12 seconds w/ improved midline mechanics in order to demonstrate decreased risk for falls and improved functional bilateral LE strength and power. Baseline: 18.89 sec w/ heavy right trunk shortening and RUE reliance w/ L toe down vs full weight bearing Goal status:  INITIAL  ASSESSMENT:  CLINICAL IMPRESSION: Focus of skilled session today on increased challenge to gait mechanics and STS transfers.  He demonstrates increased instability with incline STS due to demand on left foot in heel cord stretch position.  He would benefit from continued work on this task as well as other tasks that challenge his typical sequencing to increase demand on the LLE.  He is due for goal assessment with re-cert next visit as he is anticipated to demonstrate progress towards goals and remains highly motivated with PT. Continue per POC.  OBJECTIVE IMPAIRMENTS: Abnormal gait, decreased activity tolerance, decreased balance, decreased cognition, decreased coordination, decreased endurance, decreased knowledge of use of DME, decreased strength, impaired sensation, impaired tone, improper body mechanics, and postural dysfunction.   ACTIVITY LIMITATIONS: carrying, lifting, bending, squatting, stairs, transfers, reach over head, and locomotion level  PARTICIPATION LIMITATIONS: laundry, shopping, and community activity  PERSONAL FACTORS: Age, Fitness, Past/current experiences, Time since onset of injury/illness/exacerbation, and 1-2 comorbidities: HTN, Afib are also affecting patient's functional outcome.   REHAB POTENTIAL: Good  CLINICAL DECISION MAKING: Stable/uncomplicated  EVALUATION COMPLEXITY: Moderate  PLAN:  PT FREQUENCY: 2x/week  PT DURATION: 8 weeks  PLANNED INTERVENTIONS: [id number]- PT Re-evaluation, 97750- Physical Performance Testing, 97110-Therapeutic exercises, 97530- Therapeutic activity, [id number]- Neuromuscular re-education, 97535- Self Care, [id number]- Manual therapy, [id number]- Gait training, [phone]- Orthotic Initial, [id number]- Orthotic/Prosthetic subsequent, [id number]- Aquatic Therapy, [phone]- Electrical stimulation (manual), Patient/Family education, Balance training,  Stair training, Taping, Joint mobilization, Vestibular training, and DME instructions  PLAN FOR NEXT SESSION:  Address orthotic if pt requests!  Floor recovery - incorporate tight space setup - repeat w/ full transition to bottom and incorporate LLE use to recover to standing - pt wants to work towards no UE support if possible (did discuss limitations and safety recs 11/20).  LLE NMR/strength/reaction time.  Expand strength and balance HEP.  Core strength - work mat table level on tasks to improve floor recovery from stomach and supine.  Reaching floor/forward/overhead. Perturbations/resisted gait/farmer's carry. Try ladder? Glut strengthening.  Pt felt challenged w/: Wedge STS progress to no UE support Stairs/step taps/up; incline step through Standing 10lb kettlebell hip flexion LLE (isometric DF) Walking beanbag DF SLS taps to top step on L leg > L step ups Prolonged L stance 6 step w/ PT facilitating ankle posture Decline RLE step through working into lengthened stride 10 minutes on core at end of session - standing?  ASSESS LTGs - RECERT - 2x/wk for 4 wks  Pt declines offer of aquatic therapy at evaluation.  Daved KATHEE Bull, PT, DPT 05/02/2024, 4:09 PM

## 2024-05-04 DIAGNOSIS — I10 Essential (primary) hypertension: Secondary | ICD-10-CM | POA: Diagnosis not present

## 2024-05-04 DIAGNOSIS — E1169 Type 2 diabetes mellitus with other specified complication: Secondary | ICD-10-CM | POA: Diagnosis not present

## 2024-05-05 ENCOUNTER — Ambulatory Visit: Admitting: Physical Therapy

## 2024-05-05 ENCOUNTER — Ambulatory Visit: Admitting: Occupational Therapy

## 2024-05-05 ENCOUNTER — Encounter: Payer: Self-pay | Admitting: Physical Therapy

## 2024-05-05 DIAGNOSIS — R208 Other disturbances of skin sensation: Secondary | ICD-10-CM

## 2024-05-05 DIAGNOSIS — R2681 Unsteadiness on feet: Secondary | ICD-10-CM

## 2024-05-05 DIAGNOSIS — R209 Unspecified disturbances of skin sensation: Secondary | ICD-10-CM

## 2024-05-05 DIAGNOSIS — R29898 Other symptoms and signs involving the musculoskeletal system: Secondary | ICD-10-CM

## 2024-05-05 DIAGNOSIS — R29818 Other symptoms and signs involving the nervous system: Secondary | ICD-10-CM

## 2024-05-05 DIAGNOSIS — I69354 Hemiplegia and hemiparesis following cerebral infarction affecting left non-dominant side: Secondary | ICD-10-CM

## 2024-05-05 DIAGNOSIS — M6281 Muscle weakness (generalized): Secondary | ICD-10-CM

## 2024-05-05 DIAGNOSIS — R278 Other lack of coordination: Secondary | ICD-10-CM

## 2024-05-05 NOTE — Therapy (Signed)
 OUTPATIENT PHYSICAL THERAPY NEURO TREATMENT - RECERTIFICATION   Patient Name: Donald [name] MRN: [id number] DOB:[birth year], 78 y.o., male Today's Date: 05/05/2024   PCP: Kip Righter, MD REFERRING PROVIDER: Sherryl Bouchard, NP  END OF SESSION:  PT End of Session - 05/05/24 1454     Visit Number 17    Number of Visits 25   17+8   Date for Recertification  06/10/24   pushed out due to scheduling delay (holidays)   Authorization Type Medicare A&B w/ [employer] supplement    Progress Note Due on Visit 10    PT Start Time 1447    PT Stop Time 1535    PT Time Calculation (min) 48 min    Equipment Utilized During Treatment Gait belt    Activity Tolerance Patient tolerated treatment well    Behavior During Therapy WFL for tasks assessed/performed          Past Medical History:  Diagnosis Date   Diabetes mellitus without complication (HCC)    Dysrhythmia    Hyperlipemia    Hypertension    large right middle cerebral artery infarct, 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 s/p IV tPA, b. source unknown, c. loop recorder placed   Snoring 07/03/2015   Past Surgical History:  Procedure Laterality Date   LOOP RECORDER IMPLANT  06/07/2013   MDT LinQ implanted by Dr Kelsie for cryptogenic stroke   LOOP RECORDER IMPLANT N/A 06/07/2013   Procedure: LOOP RECORDER IMPLANT;  Surgeon: Lynwood JONETTA Kelsie, MD;  Location: [facility] CATH LAB;  Service: Cardiovascular;  Laterality: N/A;   TEE WITHOUT CARDIOVERSION N/A 06/07/2013   Procedure: TRANSESOPHAGEAL ECHOCARDIOGRAM (TEE);  Surgeon: Leim VEAR Moose, MD;  Location: [facility] ENDOSCOPY;  Service: Cardiovascular;  Laterality: N/A;   TONSILLECTOMY     TRANSANAL HEMORRHOIDAL DEARTERIALIZATION N/A 02/18/2023   Procedure: TRANSANAL HEMORRHOIDAL DEARTERIALIZATION;  Surgeon: Debby Hila, MD;  Location: [facility] ORS;  Service: General;  Laterality: N/A;   UMBILICAL HERNIA REPAIR     Patient Active Problem List   Diagnosis Date Noted   Permanent atrial fibrillation  (HCC) 05/17/2019   Acquired thrombophilia 05/17/2019   Snoring 07/03/2015   Persistent atrial fibrillation (HCC) 04/16/2015   Hypokalemia 09/20/2014   HTN (hypertension) 09/19/2014   HLD (hyperlipidemia) 09/19/2014   Diabetes mellitus without complication (HCC) 09/19/2014   Jerking 09/19/2014   Alterations of sensations, late effect of cerebrovascular disease 11/07/2013   Disturbances of vision, late effect of cerebrovascular disease 11/07/2013   Sinus bradycardia 08/08/2013   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HCC) 07/26/2013   Left-sided neglect 07/26/2013   Small vessel disease 06/07/2013   Obesity, unspecified 06/07/2013   Cerebral infarction (HCC) 06/07/2013   large right middle cerebral artery infarct, 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   Hypertension 06/03/2013   Diabetes (HCC) 06/03/2013    ONSET DATE: 06/03/2013 (CVA)  REFERRING DIAG: P30.645 (ICD-10-CM) - Hemiparesis affecting left side as late effect of cerebrovascular accident (HCC)  THERAPY DIAG:  Muscle weakness (generalized)  Other lack of coordination  Other disturbances of skin sensation  Other symptoms and signs involving the nervous system  Other symptoms and signs involving the musculoskeletal system  Unsteadiness on feet  Hemiplegia and hemiparesis following cerebral infarction affecting left non-dominant side (HCC)  Unspecified disturbances of skin sensation  Rationale for Evaluation and Treatment: Rehabilitation  SUBJECTIVE:  SUBJECTIVE STATEMENT: [name]  Pt denies falls or acute status changes.  He denies pain.  His hand feels good today.   He sees Dr. Irving on 12/8 or 12/9 for orthopedic follow-up. Pt accompanied by: self (Wife - waiting in car)  PERTINENT HISTORY: R MCA infarct 06/03/2013 S/p tPA, HTN, DM2,  Afib, had 1 seizure in 2017  PAIN:  Are you having pain? No  PRECAUTIONS: Fall and Other: loop recorder  RED FLAGS: None   WEIGHT BEARING RESTRICTIONS: No  FALLS: Has patient fallen in last 6 months? Yes. Number of falls 3  LIVING ENVIRONMENT: Lives with: lives with their spouse Lives in: House/apartment Stairs: No Has following equipment at home: Single point cane  PLOF: Requires assistive device for independence  PATIENT GOALS: To work on reaction time and getting off the floor  OBJECTIVE:  Note: Objective measures were completed at Evaluation unless otherwise noted.  DIAGNOSTIC FINDINGS:  Brain MRI 09/20/2014 IMPRESSION: 1. No acute infarct. 2. Chronic right MCA territory infarct. 3. Subtle T2 hyperintensity in the left hippocampus and left temporal lobe cortex versus artifact. Recent seizure activity is a consideration.  COGNITION: Overall cognitive status: History of cognitive impairments - at baseline - reports quicker to anger/verbalize emotions/sometimes overstimulated with sounds and distractions   SENSATION: Light touch: WFL - LLE hypersensitive/question of allodynia  COORDINATION: Mildly dysmetric LLE  EDEMA:  None significant in BLE  MUSCLE TONE: LLE: Modifed Ashworth Scale 1+ = Slight increase in muscle tone, manifested by a catch, followed by minimal resistance throughout the remainder (less than half) of the ROM  POSTURE: rounded shoulders, flexed trunk , and weight shift right - mildly flexed trunk and right weight shift in standing and with ambulation, L shoulder more rounded than R w/ mild shoulder hike  LOWER EXTREMITY ROM:     Active  Right Eval Left Eval  Hip flexion WNL WFL  Hip extension    Hip abduction    Hip adduction    Hip internal rotation    Hip external rotation    Knee flexion    Knee extension    Ankle dorsiflexion  2+  Ankle plantarflexion    Ankle inversion    Ankle eversion     (Blank rows = not tested)  LOWER  EXTREMITY MMT:    MMT Right Eval Left Eval  Hip flexion    Hip extension    Hip abduction    Hip adduction    Hip internal rotation    Hip external rotation    Knee flexion    Knee extension    Ankle dorsiflexion    Ankle plantarflexion    Ankle inversion    Ankle eversion    (Blank rows = not tested)  BED MOBILITY:  Findings: Sit to supine Complete Independence Supine to sit Complete Independence Rolling to Right Complete Independence Rolling to Left Complete Independence - needs increased time and momentum for L and R Ind w/ features of tempurpedic style bed  TRANSFERS: Sit to stand: SBA  Assistive device utilized: Single point cane     Stand to sit: SBA  Assistive device utilized: Single point cane     Chair to chair: SBA  Assistive device utilized: Single point cane       RAMP:  Not tested  CURB:  Not tested  STAIRS: Not tested GAIT: Findings: Distance walked: various clinic distances and Comments: Pt has mild right reliance w/ SPC, no foot scuff/toe catch even without AFO, shortened stride and slightly  diminished flexion pattern of LLE  FUNCTIONAL TESTS:  5 times sit to stand: 18.89 sec w/ heavy right trunk shortening and RUE reliance w/ L toe down vs full weight bearing  PATIENT SURVEYS:  ABC scale: The Activities-Specific Balance Confidence (ABC) Scale TBA                                                                                                                              TREATMENT DATE: 05-05-24 -Verbally reviewed HEP - pt feels he is doing moderately well with frequency of his HEP (rates 4-5/10 in terms of frequency), he would like to work up to 4 days - cites busy schedule and appts at end of year as limiting factor.  Reprinted per pt request. -ABC Scale:  80% -Floor Recovery: Patient educated in floor recovery this visit using teach-back for injury assessment and sequencing of task in clinic setting.  Discussion of transfer of skills to variable  scenarios outside the clinic.  Patient has most difficulty with rolling from bottom onto knees requiring minA to boost and cues to position LE efficiently.  Performed 1 times.  Discussed some scenarios where pt would have to have second person assist vs maneuvering way out of tight space w/ scooting if able. Caregiver Training:  Caregiver not present. Level of Assist:  Verbal/tactile cues, SBA, and MinA.    Pt had 2 scabs on left knee from prior bump on car door frame entering car independently, one opened during transfer to knees so cleaned w/ alcohol prep pad and applied bandaid.  -5xSTS:  13.97 sec w/ light BUE support and mildly R rear staggered stance  PATIENT EDUCATION: Education details:  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  Don't rest ankle in inverted positioning for prolonged periods.  Process for re-cert and progress towards goals.  Provided number for PM&R for consult for PF botox. Person educated: Patient Education method: Medical Illustrator Education comprehension: verbalized understanding and needs further education  HOME EXERCISE PROGRAM: Access Code: [facility] URL: [email or web address] Date: 03/21/2024 Prepared by: Daved Bull  Exercises - Seated Toe Raise  - 1-2 x daily - 4 x weekly - 2 sets - 10 reps - 2-3 seconds hold - Ankle Inversion Eversion Towel Slide  - 1-2 x daily - 4 x weekly - 2 sets - 10 reps - Seated Ankle Circles  - 1-2 x daily - 4 x weekly - 2 sets - 10 reps - Seated Heel Raise  - 1-2 x daily - 4 x weekly - 2 sets - 10 reps - 2-3 seconds hold - Seated Ankle Inversion Eversion PROM  - 1 x daily - 7 x weekly - 2 sets - 10 reps - Staggered Sit-to-Stand  - 1 x daily - 4-5 x weekly - 2-3 sets - 10 reps - Seated Hamstring Stretch  - 1 x daily - 4-5 x weekly - 1 sets - 3-4 reps - 45 seconds hold - Hooklying  Single Knee to Chest Stretch with Towel  - 1 x daily - 4-5 x weekly - 1 sets - 3-4 reps - 45 seconds  hold - Seated Toe Towel Scrunches  - 1 x daily - 4 x weekly - 1-2 sets - 10 reps - Seated Arch Lifts  - 1 x daily - 4 x weekly - 1-2 sets - 10 reps  Desensitization Techniques: -Light touch/pressure:  using fingertip pressure to slowly move up small segments of the affected body part until outside the area of pain and then back to where you started -Deep pressure:  using fingertips to squeeze in small segments starting outside the affected area, crossing over the affected area, and then to the other side of the affected area -Tapping:  fingertips tap with firm pressure over the affected area, can move around the affected area as well -Brushing/scratching:  use fingertips to brush/scratch over and around the affected area -Textures:  use soft and rough textured items like cotton balls vs wash cloths to brush or rub with light into firm pressure over and around the affected area  Repeat these 3-4x per day.   GOALS: Goals reviewed with patient? Yes  SHORT TERM GOALS: Target date: 04/08/2024  Pt will be independent and compliant with introductory strength and balance HEP in order to maintain functional progress and improve mobility. Baseline:  IND and compliant (11/26) Goal status: MET  2.  Pt will demonstrate adequate balance strategies to maintain upright with forward/overhead/and floor reaching. Baseline:  Very narrow limits of stability - several near falls related to this; ongoing (11/26) Goal status: IN PROGRESS  3.  Pt will be independent with desensitization strategies for LLE hypersensitivity management. Baseline: Provided - pt has handout and actively working on in OT/PT (11/26) Goal status: MET  LONG TERM GOALS: Target date: 05/06/2024  Pt will be independent and compliant with advanced and finalized strength and balance HEP in order to maintain functional progress and improve mobility. Baseline: To be established; pt feels he is doing moderately well with frequency of his HEP  (12/4) Goal status: PARTIALLY MET  2.  Patient will improve ABC Scale score to >/=76% in order to demonstrate decreased risk for falls and increased confidence in balance. Baseline: 66.875% (10/14); 80% (12/4) Goal status: MET  3.  Patient will demonstrate floor recovery w/ no more than SBA in order to improve safety and home management. Baseline: Reports recent minA even w/ support surface; minA to flip from bottom to knees using support surface (12/4) Goal status: IN PROGRESS  4.  Pt will decrease 5xSTS to </=12 seconds w/ improved midline mechanics in order to demonstrate decreased risk for falls and improved functional bilateral LE strength and power. Baseline: 18.89 sec w/ heavy right trunk shortening and RUE reliance w/ L toe down vs full weight bearing; 13.97 sec w/ light BUE support and mildly R rear staggered stance (12/4) Goal status: PARTIALLY MET  GOALS (at 87/5 re-cert): Goals reviewed with patient? Yes SHORT TERM GOALS = LONG TERM GOALS: Target date: 06/03/2024  Pt will be independent and compliant with advanced and finalized strength and balance HEP in order to maintain functional progress and improve mobility. Baseline: To be established; pt feels he is doing moderately well with frequency of his HEP (12/4) Goal status: ONGOING  2.  Patient will improve ABC Scale score to >/=90% in order to demonstrate decreased risk for falls and increased confidence in balance. Baseline: 66.875% (10/14); 80% (12/4) Goal status: INITIAL  3.  Patient will demonstrate floor recovery w/ no more than SBA in order to improve safety and home management. Baseline: Reports recent minA even w/ support surface; minA to flip from bottom to knees using support surface (12/4) Goal status: ONGOING  4.  Pt will decrease 5xSTS to </=12 seconds w/ improved midline mechanics in order to demonstrate decreased risk for falls and improved functional bilateral LE strength and power. Baseline: 18.89 sec w/  heavy right trunk shortening and RUE reliance w/ L toe down vs full weight bearing; 13.97 sec w/ light BUE support and mildly R rear staggered stance (12/4) Goal status: ONGOING  ASSESSMENT:  CLINICAL IMPRESSION: Focus of skilled PT session today on LTG assessment with pt making significant progress with STS decreasing time to roughly 13 seconds compared to almost 19 seconds prior.  Time spent problem solving floor recovery with discussion of scenarios where independence may not be feasible.  He only needed minA to recover from his bottom to knees due to functional hip weakness and long extremities making tight space placement difficult.  His confidence in his balance surged to 80% on the ABC Scale.  Overall, he is making great functional gains and remains motivated to continue addressing functional strength and stability and improve safe active and passive positioning of his ankle.  He is to consult PM&R physician about benefits of botox to help with ankle mobility and flexibility.  Continue per POC.  OBJECTIVE IMPAIRMENTS: Abnormal gait, decreased activity tolerance, decreased balance, decreased cognition, decreased coordination, decreased endurance, decreased knowledge of use of DME, decreased strength, impaired sensation, impaired tone, improper body mechanics, and postural dysfunction.   ACTIVITY LIMITATIONS: carrying, lifting, bending, squatting, stairs, transfers, reach over head, and locomotion level  PARTICIPATION LIMITATIONS: laundry, shopping, and community activity  PERSONAL FACTORS: Age, Fitness, Past/current experiences, Time since onset of injury/illness/exacerbation, and 1-2 comorbidities: HTN, Afib are also affecting patient's functional outcome.   REHAB POTENTIAL: Good  CLINICAL DECISION MAKING: Stable/uncomplicated  EVALUATION COMPLEXITY: Moderate  PLAN:  PT FREQUENCY: 2x/week + 2x/wk  PT DURATION: 8 weeks + 4 wks  PLANNED INTERVENTIONS: [id number]- PT Re-evaluation, 97750-  Physical Performance Testing, 97110-Therapeutic exercises, 97530- Therapeutic activity, [id number]- Neuromuscular re-education, 97535- Self Care, [id number]- Manual therapy, [id number]- Gait training, [phone]- Orthotic Initial, [phone]- Orthotic/Prosthetic subsequent, [id number]- Aquatic Therapy, [phone]- Electrical stimulation (manual), Patient/Family education, Balance training, Stair training, Taping, Joint mobilization, Vestibular training, and DME instructions  PLAN FOR NEXT SESSION: Address orthotic if pt requests!  Floor recovery - incorporate tight space setup - repeat w/ full transition to bottom and incorporate LLE use to recover to standing - pt wants to work towards no UE support if possible (did discuss limitations and safety recs 11/20).  LLE NMR/strength/reaction time.  Expand strength and balance HEP.  Core strength - work mat table level on tasks to improve floor recovery from stomach and supine.  Reaching floor/forward/overhead. Perturbations/resisted gait/farmer's carry. Try ladder? Glut strengthening.  Pt felt challenged w/: Wedge STS progress to no UE support Stairs/step taps/up; incline step through Standing 10lb kettlebell hip flexion LLE (isometric DF) Walking beanbag DF SLS taps to top step on L leg > L step ups Prolonged L stance 6 step w/ PT facilitating ankle posture Decline RLE step through working into lengthened stride 10 minutes on core at end of session - standing?   Pt declines offer of aquatic therapy at evaluation.  Daved KATHEE Bull, PT, DPT 05/05/2024, 5:25 PM

## 2024-05-05 NOTE — Therapy (Unsigned)
 OUTPATIENT OCCUPATIONAL THERAPY NEURO TREATMENT AND PROGRESS NOTE  Patient Name: Donald [name] MRN: [id number] DOB:Apr 07, 1946, 78 y.o., male Today's Date: 05/05/2024  PCP: Kip Righter, MD REFERRING PROVIDER: Sherryl Bouchard, NP  END OF SESSION:  OT End of Session - 05/05/24 1518     Visit Number 10    Number of Visits 17    Date for Recertification  06/04/24    Authorization Type MCR A&B/[employer]    Progress Note Due on Visit 10    OT Start Time 1537    OT Stop Time 1621    OT Time Calculation (min) 44 min    Activity Tolerance Patient tolerated treatment well    Behavior During Therapy WFL for tasks assessed/performed         Past Medical History:  Diagnosis Date   Diabetes mellitus without complication (HCC)    Dysrhythmia    Hyperlipemia    Hypertension    large right middle cerebral artery infarct, 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 s/p IV tPA, b. source unknown, c. loop recorder placed   Snoring 07/03/2015   Past Surgical History:  Procedure Laterality Date   LOOP RECORDER IMPLANT  06/07/2013   MDT LinQ implanted by Dr Kelsie for cryptogenic stroke   LOOP RECORDER IMPLANT N/A 06/07/2013   Procedure: LOOP RECORDER IMPLANT;  Surgeon: Lynwood JONETTA Kelsie, MD;  Location: [facility];  Service: Cardiovascular;  Laterality: N/A;   TEE WITHOUT CARDIOVERSION N/A 06/07/2013   Procedure: TRANSESOPHAGEAL ECHOCARDIOGRAM (TEE);  Surgeon: Leim VEAR Moose, MD;  Location: [facility];  Service: Cardiovascular;  Laterality: N/A;   TONSILLECTOMY     TRANSANAL HEMORRHOIDAL DEARTERIALIZATION N/A 02/18/2023   Procedure: TRANSANAL HEMORRHOIDAL DEARTERIALIZATION;  Surgeon: Debby Hila, MD;  Location: [facility];  Service: General;  Laterality: N/A;   UMBILICAL HERNIA REPAIR     Patient Active Problem List   Diagnosis Date Noted   Permanent atrial fibrillation (HCC) 05/17/2019   Acquired thrombophilia 05/17/2019   Snoring 07/03/2015   Persistent atrial fibrillation (HCC) 04/16/2015    Hypokalemia 09/20/2014   HTN (hypertension) 09/19/2014   HLD (hyperlipidemia) 09/19/2014   Diabetes mellitus without complication (HCC) 09/19/2014   Jerking 09/19/2014   Alterations of sensations, late effect of cerebrovascular disease 11/07/2013   Disturbances of vision, late effect of cerebrovascular disease 11/07/2013   Sinus bradycardia 08/08/2013   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HCC) 07/26/2013   Left-sided neglect 07/26/2013   Small vessel disease 06/07/2013   Obesity, unspecified 06/07/2013   Cerebral infarction (HCC) 06/07/2013   large right middle cerebral artery infarct, 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   Hypertension 06/03/2013   Diabetes (HCC) 06/03/2013   ONSET DATE: 06/03/2013 (CVA), 03/15/2024 referral date  REFERRING DIAG: P30.645 (ICD-10-CM) - Hemiplegia and hemiparesis following cerebral infarction affecting left non-dominant side (HCC)  THERAPY DIAG:  Muscle weakness (generalized)  Other lack of coordination  Other disturbances of skin sensation  Other symptoms and signs involving the nervous system  Other symptoms and signs involving the musculoskeletal system  Hemiplegia and hemiparesis following cerebral infarction affecting left non-dominant side (HCC)  Rationale for Evaluation and Treatment: Rehabilitation  SUBJECTIVE:   SUBJECTIVE STATEMENT: Pt reports he ***  Pt accompanied by: self  PERTINENT HISTORY: R MCA infarct 06/03/2013 S/p tPA, HTN, DM2, Afib, had 1 seizure in 2017   DIAGNOSTIC FINDINGS:  Brain MRI 09/20/2014 IMPRESSION: 1. No acute infarct. 2. Chronic right MCA territory infarct. 3. Subtle T2 hyperintensity in the left hippocampus and left temporal lobe cortex versus artifact. Recent seizure activity is  a consideration  PRECAUTIONS: Fall and Other: loop recorder, L sided weakness  WEIGHT BEARING RESTRICTIONS: No  PAIN:  Are you having pain? No  FALLS: Has patient fallen in last 6 months? Yes. Number of falls 4 in the last  year  LIVING ENVIRONMENT: Lives with: lives with their spouse Lives in: House/apartment 1 level Stairs: No Has following equipment at home: Single point cane, shower chair/bench with back, walk in shower  PLOF: Requires assistive device for independence  PATIENT GOALS: I'd like to be able to use my left hand more functionally. I want to improve my coordination and reaction time.  OBJECTIVE:  Note: Objective measures were completed at Evaluation unless otherwise noted.  HAND DOMINANCE: Right  ADLs: Overall ADLs: Independent  Equipment: Shower seat with back and Walk in shower  IADLs: Cooks and cleans bathrooms and kitchen Shopping:  Light housekeeping: Independent Meal Prep: Independent Community mobility: Wife completes  Medication management: Independent Landscape architect: Independent Handwriting: did not assess d/t nondominant L hand being affected  MOBILITY STATUS: ambulates with SPC  POSTURE COMMENTS:  rounded shoulders, flexed trunk , and weight shift right - mildly flexed trunk and right weight shift in standing and with ambulation, L shoulder more rounded than R w/ mild shoulder hike   ACTIVITY TOLERANCE: Activity tolerance: no changes  FUNCTIONAL OUTCOME MEASURES: PSFS: 05/05/2024:    UPPER EXTREMITY ROM:  WNL  UPPER EXTREMITY MMT:   4-/5 shoulder   HAND FUNCTION: Grip strength: Right: 73.1 lbs; Left: 58.2 lbs  COORDINATION: 9 Hole Peg test: Right: 26.32 sec; Left: 65.08 sec Box and Blocks:  Right 49 blocks, Left 29 blocks  SENSATION: Reports hypersensitivity in fingertips  EDEMA: Pt reports hx of edema but it is rare now.  MUSCLE TONE: LUE: Modifed Ashworth Scale 1 = Slight increase in muscle tone, manifested by a catch and release or by minimal resistance at the end of the range of motion when the affected part(s) is moved in flexion or extension  COGNITION: Overall cognitive status: Within functional limits for tasks  assessed  VISION: Subjective report: Pt reports having small cataracts but it is not affecting vision at this time Baseline vision: Wears glasses all the time Visual history: cataracts well kept  VISION ASSESSMENT: To be further assessed in functional context  PERCEPTION: WFL  PRAXIS: WFL  OBSERVATIONS: Impaired FM coordination, grip strength, strength in L hand and shoulder, hypersensitivity in fingertips of L hand                                                                                                                           TREATMENT: 05/02/24 Pt participated in therapeutic activities honing tip to tip pinch pulling string to participate in soccer board game to simulate strumming guitar string with L hand. Pt next completed with 4# dowel shoulder/elbow extension/flexion hitting moving target. With same rod completed 20 reps of shoulder reverse rows and shoulder extension with green theraband around rod for increased resistance.  Next pt participated in therapeutic activities stringing and unstringing beads from textured string with L hand. Pt finally completed 3 point pinch inserting and removing resistive clothespins (1-8# resistance) to/from lines of varying thicknesses.  PATIENT EDUCATION: Education details: Proper form of pinching for resistive pin activity Person educated: Patient Education method: Explanation, Demonstration, Tactile cues, and Verbal cues Education comprehension: verbalized understanding, returned demonstration, verbal cues required, and tactile cues required  HOME EXERCISE PROGRAM: 04/04/24: Desensitization 04/07/24: Putty, coordination 04/14/24: Golf solitaire 11.17.25: Access Code: [email or web address] URL: [email or web address] Date: 04/18/2024 Prepared by: Rocky Dutch  Exercises - Standing Shoulder Horizontal Abduction with Resistance  - 1 x daily - 5 x weekly - 2 sets - 15 reps - Standing Shoulder Abduction with Self-Anchored Resistance   - 1 x daily - 5 x weekly - 2 sets - 15 reps - Standing Shoulder Flexion with Self-Anchored Resistance  - 1 x daily - 5 x weekly - 2 sets - 15 reps - Seated Shoulder Extension with Resistance  - 1 x daily - 5 x weekly - 2 sets - 10 reps   GOALS: Goals reviewed with patient? Yes  SHORT TERM GOALS: Target date: 05/04/24  Pt will be independent with HEP completion for coordination and strengthening Baseline: New to OP OT Goal status: MET  2.  Pt will be independent with desensitization techniques Baseline: Educated  Goal status: MET  3.  Pt will increase L shoulder strength to 4/5 Baseline: 4-/5  Goal status:  MET  4.  Pt will demonstrate improved coordination in L hand by a score of 34 blocks in Box and Blocks Test Baseline: Right 49 blocks, Left 29 blocks 05/05/2024: 36 blocks Goal status:  MET  LONG TERM GOALS: Target date: 06/04/24  Pt will demonstrate an increase of at least 2.0 score of average PSFS score or a 3 point increase in individual activity score in PSFS Baseline: 1.0 05/05/2024: 3.7 total score (see above for individual activity scores) Goal status:  MET  2.  Pt will demonstrate improved L hand FM coordination by a score of 60 seconds or less in 9HPT Baseline: Right: 26.32 sec; Left: 65.08 sec 05/05/2024: 59 seconds Goal status:  MET  3.  Pt will demonstrate improved grip strength in L hand by scoring at least 65 pounds Baseline: Right: 73.1 lbs; Left: 58.2 lbs 05/05/2024: 58.2 lbs Goal status:  IN PROGRESS  4.  Pt will demonstrate improved L coordination by a score of at least 39 blocks on Box and Blocks test Baseline: 29 blocks 05/05/2024: 36 blocks Goal status:  IN PROGRESS   ASSESSMENT:  CLINICAL IMPRESSION: This 10th progress note is for dates: 04/04/2024 to 05/05/2024. Pt has met 4/4 STGs and 2/4 LTGs. Pt making progress towards goals as expected and continues to benefit from skilled OT services in the outpatient setting to work towards remaining goals or  until max rehab potential is met.   PERFORMANCE DEFICITS: in functional skills including IADLs, coordination, dexterity, sensation, strength, Fine motor control, Gross motor control, decreased knowledge of precautions, and UE functional use, cognitive skills including safety awareness, and psychosocial skills including coping strategies and environmental adaptation.   IMPAIRMENTS: are limiting patient from IADLs, work, and leisure.   CO-MORBIDITIES: may have co-morbidities  that affects occupational performance. Patient will benefit from skilled OT to address above impairments and improve overall function  REHAB POTENTIAL: Good  PLAN:  OT FREQUENCY: 2x/week  OT DURATION: 8 weeks  PLANNED INTERVENTIONS: 02831 OT Re-evaluation, 97535 self care/ADL training, 02889  therapeutic exercise, 97530 therapeutic activity, 97112 neuromuscular re-education, passive range of motion, psychosocial skills training, coping strategies training, patient/family education, and DME and/or AE instructions  RECOMMENDED OTHER SERVICES: none at this time  CONSULTED AND AGREED WITH PLAN OF CARE: Patient  PLAN FOR NEXT SESSION:  Coordination activities- gross motor activities  Blazepods Strengthening activities L shoulder and hand (tennis ball grasp/pick up with marbles) Activities reducing sensitivity in L fingertips   Jocelyn CHRISTELLA Bottom, OT 05/05/2024, 4:22 PM

## 2024-05-10 ENCOUNTER — Ambulatory Visit: Admitting: Occupational Therapy

## 2024-05-10 DIAGNOSIS — R296 Repeated falls: Secondary | ICD-10-CM | POA: Diagnosis not present

## 2024-05-10 DIAGNOSIS — G5732 Lesion of lateral popliteal nerve, left lower limb: Secondary | ICD-10-CM | POA: Diagnosis not present

## 2024-05-10 DIAGNOSIS — M19072 Primary osteoarthritis, left ankle and foot: Secondary | ICD-10-CM | POA: Diagnosis not present

## 2024-05-11 ENCOUNTER — Telehealth: Payer: Self-pay | Admitting: Neurology

## 2024-05-11 ENCOUNTER — Ambulatory Visit: Admitting: Physical Therapy

## 2024-05-11 ENCOUNTER — Encounter: Payer: Self-pay | Admitting: Neurology

## 2024-05-11 ENCOUNTER — Encounter: Payer: Self-pay | Admitting: Physical Therapy

## 2024-05-11 DIAGNOSIS — M6281 Muscle weakness (generalized): Secondary | ICD-10-CM | POA: Diagnosis not present

## 2024-05-11 DIAGNOSIS — R278 Other lack of coordination: Secondary | ICD-10-CM

## 2024-05-11 DIAGNOSIS — R2681 Unsteadiness on feet: Secondary | ICD-10-CM

## 2024-05-11 DIAGNOSIS — R29898 Other symptoms and signs involving the musculoskeletal system: Secondary | ICD-10-CM

## 2024-05-11 DIAGNOSIS — R209 Unspecified disturbances of skin sensation: Secondary | ICD-10-CM

## 2024-05-11 DIAGNOSIS — R208 Other disturbances of skin sensation: Secondary | ICD-10-CM

## 2024-05-11 DIAGNOSIS — R29818 Other symptoms and signs involving the nervous system: Secondary | ICD-10-CM

## 2024-05-11 DIAGNOSIS — I69354 Hemiplegia and hemiparesis following cerebral infarction affecting left non-dominant side: Secondary | ICD-10-CM

## 2024-05-11 NOTE — Therapy (Signed)
 OUTPATIENT PHYSICAL THERAPY NEURO TREATMENT   Patient Name: Donald [name] MRN: [id number] DOB:1946/01/10, 78 y.o., male Today's Date: 05/11/2024   PCP: Donald Righter, MD REFERRING PROVIDER: Sherryl Bouchard, NP  END OF SESSION:  PT End of Session - 05/11/24 1543     Visit Number 18    Number of Visits 25   17+8   Date for Recertification  06/10/24   pushed out due to scheduling delay (holidays)   Authorization Type Medicare A&B w/ [employer] supplement    Progress Note Due on Visit 10    PT Start Time 1531    PT Stop Time 1619    PT Time Calculation (min) 48 min    Equipment Utilized During Treatment Gait belt    Activity Tolerance Patient tolerated treatment well    Behavior During Therapy WFL for tasks assessed/performed          Past Medical History:  Diagnosis Date   Diabetes mellitus without complication (HCC)    Dysrhythmia    Hyperlipemia    Hypertension    large right middle cerebral artery infarct, 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 s/p IV tPA, b. source unknown, c. loop recorder placed   Snoring 07/03/2015   Past Surgical History:  Procedure Laterality Date   LOOP RECORDER IMPLANT  06/07/2013   MDT LinQ implanted by Dr [name] for cryptogenic stroke   LOOP RECORDER IMPLANT N/A 06/07/2013   Procedure: LOOP RECORDER IMPLANT;  Surgeon: Donald JONETTA Kelsie, MD;  Location: [facility] CATH LAB;  Service: Cardiovascular;  Laterality: N/A;   TEE WITHOUT CARDIOVERSION N/A 06/07/2013   Procedure: TRANSESOPHAGEAL ECHOCARDIOGRAM (TEE);  Surgeon: Leim Donald Moose, MD;  Location: [facility] ENDOSCOPY;  Service: Cardiovascular;  Laterality: N/A;   TONSILLECTOMY     TRANSANAL HEMORRHOIDAL DEARTERIALIZATION N/A 02/18/2023   Procedure: TRANSANAL HEMORRHOIDAL DEARTERIALIZATION;  Surgeon: Donald Hila, MD;  Location: [facility] ORS;  Service: General;  Laterality: N/A;   UMBILICAL HERNIA REPAIR     Patient Active Problem List   Diagnosis Date Noted   Permanent atrial fibrillation (HCC) 05/17/2019    Acquired thrombophilia 05/17/2019   Snoring 07/03/2015   Persistent atrial fibrillation (HCC) 04/16/2015   Hypokalemia 09/20/2014   HTN (hypertension) 09/19/2014   HLD (hyperlipidemia) 09/19/2014   Diabetes mellitus without complication (HCC) 09/19/2014   Jerking 09/19/2014   Alterations of sensations, late effect of cerebrovascular disease 11/07/2013   Disturbances of vision, late effect of cerebrovascular disease 11/07/2013   Sinus bradycardia 08/08/2013   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HCC) 07/26/2013   Left-sided neglect 07/26/2013   Small vessel disease 06/07/2013   Obesity, unspecified 06/07/2013   Cerebral infarction (HCC) 06/07/2013   large right middle cerebral artery infarct, 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   Hypertension 06/03/2013   Diabetes (HCC) 06/03/2013    ONSET DATE: 06/03/2013 (CVA)  REFERRING DIAG: P30.645 (ICD-10-CM) - Hemiparesis affecting left side as late effect of cerebrovascular accident (HCC)  THERAPY DIAG:  Muscle weakness (generalized)  Other lack of coordination  Other disturbances of skin sensation  Other symptoms and signs involving the nervous system  Other symptoms and signs involving the musculoskeletal system  Hemiplegia and hemiparesis following cerebral infarction affecting left non-dominant side (HCC)  Unsteadiness on feet  Unspecified disturbances of skin sensation  Rationale for Evaluation and Treatment: Rehabilitation  SUBJECTIVE:  SUBJECTIVE STATEMENT: Donald [name]  Pt denies falls or acute status changes.  He denies pain.  His hand feels good today.  He reports he saw the orthopedic MD and they discussed AFO options and wedge being built into current orthotic.  He has been more diligent about his HEP.  He sees Donald [name] on 12/8 or 12/9 for  orthopedic follow-up. Pt accompanied by: self (Wife - waiting in car)  PERTINENT HISTORY: R MCA infarct 06/03/2013 S/p tPA, HTN, DM2, Afib, had 1 seizure in 2017  PAIN:  Are you having pain? No  PRECAUTIONS: Fall and Other: loop recorder  RED FLAGS: None   WEIGHT BEARING RESTRICTIONS: No  FALLS: Has patient fallen in last 6 months? Yes. Number of falls 3  LIVING ENVIRONMENT: Lives with: lives with their spouse Lives in: House/apartment Stairs: No Has following equipment at home: Single point cane  PLOF: Requires assistive device for independence  PATIENT GOALS: To work on reaction time and getting off the floor  OBJECTIVE:  Note: Objective measures were completed at Evaluation unless otherwise noted.  DIAGNOSTIC FINDINGS:  Brain MRI 09/20/2014 IMPRESSION: 1. No acute infarct. 2. Chronic right MCA territory infarct. 3. Subtle T2 hyperintensity in the left hippocampus and left temporal lobe cortex versus artifact. Recent seizure activity is a consideration.  COGNITION: Overall cognitive status: History of cognitive impairments - at baseline - reports quicker to anger/verbalize emotions/sometimes overstimulated with sounds and distractions   SENSATION: Light touch: WFL - LLE hypersensitive/question of allodynia  COORDINATION: Mildly dysmetric LLE  EDEMA:  None significant in BLE  MUSCLE TONE: LLE: Modifed Ashworth Scale 1+ = Slight increase in muscle tone, manifested by a catch, followed by minimal resistance throughout the remainder (less than half) of the ROM  POSTURE: rounded shoulders, flexed trunk , and weight shift right - mildly flexed trunk and right weight shift in standing and with ambulation, L shoulder more rounded than R w/ mild shoulder hike  LOWER EXTREMITY ROM:     Active  Right Eval Left Eval  Hip flexion WNL WFL  Hip extension    Hip abduction    Hip adduction    Hip internal rotation    Hip external rotation    Knee flexion    Knee  extension    Ankle dorsiflexion  2+  Ankle plantarflexion    Ankle inversion    Ankle eversion     (Blank rows = not tested)  LOWER EXTREMITY MMT:    MMT Right Eval Left Eval  Hip flexion    Hip extension    Hip abduction    Hip adduction    Hip internal rotation    Hip external rotation    Knee flexion    Knee extension    Ankle dorsiflexion    Ankle plantarflexion    Ankle inversion    Ankle eversion    (Blank rows = not tested)  BED MOBILITY:  Findings: Sit to supine Complete Independence Supine to sit Complete Independence Rolling to Right Complete Independence Rolling to Left Complete Independence - needs increased time and momentum for L and R Ind w/ features of tempurpedic style bed  TRANSFERS: Sit to stand: SBA  Assistive device utilized: Single point cane     Stand to sit: SBA  Assistive device utilized: Single point cane     Chair to chair: SBA  Assistive device utilized: Single point cane       RAMP:  Not tested  CURB:  Not tested  STAIRS: Not  tested GAIT: Findings: Distance walked: various clinic distances and Comments: Pt has mild right reliance w/ SPC, no foot scuff/toe catch even without AFO, shortened stride and slightly diminished flexion pattern of LLE  FUNCTIONAL TESTS:  5 times sit to stand: 18.89 sec w/ heavy right trunk shortening and RUE reliance w/ L toe down vs full weight bearing  PATIENT SURVEYS:  ABC scale: The Activities-Specific Balance Confidence (ABC) Scale TBA                                                                                                                              TREATMENT DATE: 05-11-24 -Wedge STS 3x10, best symmetry cue using 2 UE to reach back and sit -2 offset squat L dominant 2x10 -2 offset mini lunge w/ LLE 2x10 -Hamstring pushbacks w/ PT facilitating knee posture x7 (pt requires seated recovery due to LE fatigue)  PATIENT EDUCATION: Education details:  Discussed AFO options per orthopedic  recommendation and pt to follow-up with [facility] (referred by ortho). Person educated: Patient Education method: Medical Illustrator Education comprehension: verbalized understanding and needs further education  HOME EXERCISE PROGRAM: Access Code: [facility] URL: [email or web address] Date: 03/21/2024 Prepared by: Daved Bull  Exercises - Seated Toe Raise  - 1-2 x daily - 4 x weekly - 2 sets - 10 reps - 2-3 seconds hold - Ankle Inversion Eversion Towel Slide  - 1-2 x daily - 4 x weekly - 2 sets - 10 reps - Seated Ankle Circles  - 1-2 x daily - 4 x weekly - 2 sets - 10 reps - Seated Heel Raise  - 1-2 x daily - 4 x weekly - 2 sets - 10 reps - 2-3 seconds hold - Seated Ankle Inversion Eversion PROM  - 1 x daily - 7 x weekly - 2 sets - 10 reps - Staggered Sit-to-Stand  - 1 x daily - 4-5 x weekly - 2-3 sets - 10 reps - Seated Hamstring Stretch  - 1 x daily - 4-5 x weekly - 1 sets - 3-4 reps - 45 seconds hold - Hooklying Single Knee to Chest Stretch with Towel  - 1 x daily - 4-5 x weekly - 1 sets - 3-4 reps - 45 seconds hold - Seated Toe Towel Scrunches  - 1 x daily - 4 x weekly - 1-2 sets - 10 reps - Seated Arch Lifts  - 1 x daily - 4 x weekly - 1-2 sets - 10 reps  Desensitization Techniques: -Light touch/pressure:  using fingertip pressure to slowly move up small segments of the affected body part until outside the area of pain and then back to where you started -Deep pressure:  using fingertips to squeeze in small segments starting outside the affected area, crossing over the affected area, and then to the other side of the affected area -Tapping:  fingertips tap with firm pressure over the affected area, can move around the affected area as well -Brushing/scratching:  use fingertips  to brush/scratch over and around the affected area -Textures:  use soft and rough textured items like cotton balls vs wash cloths to brush or rub with light into firm pressure over and  around the affected area  Repeat these 3-4x per day.   GOALS: Goals reviewed with patient? Yes  SHORT TERM GOALS: Target date: 04/08/2024  Pt will be independent and compliant with introductory strength and balance HEP in order to maintain functional progress and improve mobility. Baseline:  IND and compliant (11/26) Goal status: MET  2.  Pt will demonstrate adequate balance strategies to maintain upright with forward/overhead/and floor reaching. Baseline:  Very narrow limits of stability - several near falls related to this; ongoing (11/26) Goal status: IN PROGRESS  3.  Pt will be independent with desensitization strategies for LLE hypersensitivity management. Baseline: Provided - pt has handout and actively working on in OT/PT (11/26) Goal status: MET  LONG TERM GOALS: Target date: 05/06/2024  Pt will be independent and compliant with advanced and finalized strength and balance HEP in order to maintain functional progress and improve mobility. Baseline: To be established; pt feels he is doing moderately well with frequency of his HEP (12/4) Goal status: PARTIALLY MET  2.  Patient will improve ABC Scale score to >/=76% in order to demonstrate decreased risk for falls and increased confidence in balance. Baseline: 66.875% (10/14); 80% (12/4) Goal status: MET  3.  Patient will demonstrate floor recovery w/ no more than SBA in order to improve safety and home management. Baseline: Reports recent minA even w/ support surface; minA to flip from bottom to knees using support surface (12/4) Goal status: IN PROGRESS  4.  Pt will decrease 5xSTS to </=12 seconds w/ improved midline mechanics in order to demonstrate decreased risk for falls and improved functional bilateral LE strength and power. Baseline: 18.89 sec w/ heavy right trunk shortening and RUE reliance w/ L toe down vs full weight bearing; 13.97 sec w/ light BUE support and mildly R rear staggered stance (12/4) Goal status:  PARTIALLY MET  GOALS (at 87/5 re-cert): Goals reviewed with patient? Yes SHORT TERM GOALS = LONG TERM GOALS: Target date: 06/03/2024  Pt will be independent and compliant with advanced and finalized strength and balance HEP in order to maintain functional progress and improve mobility. Baseline: To be established; pt feels he is doing moderately well with frequency of his HEP (12/4) Goal status: ONGOING  2.  Patient will improve ABC Scale score to >/=90% in order to demonstrate decreased risk for falls and increased confidence in balance. Baseline: 66.875% (10/14); 80% (12/4) Goal status: INITIAL  3.  Patient will demonstrate floor recovery w/ no more than SBA in order to improve safety and home management. Baseline: Reports recent minA even w/ support surface; minA to flip from bottom to knees using support surface (12/4) Goal status: ONGOING  4.  Pt will decrease 5xSTS to </=12 seconds w/ improved midline mechanics in order to demonstrate decreased risk for falls and improved functional bilateral LE strength and power. Baseline: 18.89 sec w/ heavy right trunk shortening and RUE reliance w/ L toe down vs full weight bearing; 13.97 sec w/ light BUE support and mildly R rear staggered stance (12/4) Goal status: ONGOING  ASSESSMENT:  CLINICAL IMPRESSION: Emphasis of skilled PT session today on addressing left LE loading and active hamstring elongation.  He was greatly fatigued by techniques today especially hamstring pushbacks reporting adequate stretching in the left hamstring.  He is working with orthotic clinic  and podiatrist to obtain best AFO and orthotic modification combination to improve safety in L stance.  PT to continue addressing intrinsic foot, ankle and LLE strength to improve gait mechanics and dynamic stability.  Continue per POC.  OBJECTIVE IMPAIRMENTS: Abnormal gait, decreased activity tolerance, decreased balance, decreased cognition, decreased coordination, decreased  endurance, decreased knowledge of use of DME, decreased strength, impaired sensation, impaired tone, improper body mechanics, and postural dysfunction.   ACTIVITY LIMITATIONS: carrying, lifting, bending, squatting, stairs, transfers, reach over head, and locomotion level  PARTICIPATION LIMITATIONS: laundry, shopping, and community activity  PERSONAL FACTORS: Age, Fitness, Past/current experiences, Time since onset of injury/illness/exacerbation, and 1-2 comorbidities: HTN, Afib are also affecting patient's functional outcome.   REHAB POTENTIAL: Good  CLINICAL DECISION MAKING: Stable/uncomplicated  EVALUATION COMPLEXITY: Moderate  PLAN:  PT FREQUENCY: 2x/week + 2x/wk  PT DURATION: 8 weeks + 4 wks  PLANNED INTERVENTIONS: [id number]- PT Re-evaluation, 97750- Physical Performance Testing, 97110-Therapeutic exercises, 97530- Therapeutic activity, [id number]- Neuromuscular re-education, 97535- Self Care, [id number]- Manual therapy, [id number]- Gait training, [phone]- Orthotic Initial, [phone]- Orthotic/Prosthetic subsequent, [phone]- Aquatic Therapy, [phone]- Electrical stimulation (manual), Patient/Family education, Balance training, Stair training, Taping, Joint mobilization, Vestibular training, and DME instructions  PLAN FOR NEXT SESSION: Address orthotic if pt requests!  Floor recovery - incorporate tight space setup - repeat w/ full transition to bottom and incorporate LLE use to recover to standing - pt wants to work towards no UE support if possible (did discuss limitations and safety recs 11/20).  LLE NMR/strength/reaction time.  Expand strength and balance HEP.  Core strength - work mat table level on tasks to improve floor recovery from stomach and supine.  Reaching floor/forward/overhead. Perturbations/resisted gait/farmer's carry. Try ladder? Glut strengthening.  Pt felt challenged w/: Offset squats/mini lunges Hamstring pushbacks - try strap to pull knees posteriorly (prevent squatting) Wedge STS progress  to no UE support Stairs/step taps/up; incline step through Standing 10lb kettlebell hip flexion LLE (isometric DF) Walking beanbag DF SLS taps to top step on L leg > L step ups Prolonged L stance 6 step w/ PT facilitating ankle posture Decline RLE step through working into lengthened stride 10 minutes on core at end of session - standing?   Pt declines offer of aquatic therapy at evaluation.  Daved KATHEE Bull, PT, DPT 05/11/2024, 4:23 PM

## 2024-05-11 NOTE — Telephone Encounter (Signed)
 LVM and sent letter in mail informing pt of need to reschedule 05/11/24 appt - MD out

## 2024-05-12 ENCOUNTER — Ambulatory Visit: Admitting: Occupational Therapy

## 2024-05-12 DIAGNOSIS — R29818 Other symptoms and signs involving the nervous system: Secondary | ICD-10-CM

## 2024-05-12 DIAGNOSIS — R278 Other lack of coordination: Secondary | ICD-10-CM

## 2024-05-12 DIAGNOSIS — I69354 Hemiplegia and hemiparesis following cerebral infarction affecting left non-dominant side: Secondary | ICD-10-CM

## 2024-05-12 DIAGNOSIS — M6281 Muscle weakness (generalized): Secondary | ICD-10-CM | POA: Diagnosis not present

## 2024-05-12 DIAGNOSIS — R208 Other disturbances of skin sensation: Secondary | ICD-10-CM

## 2024-05-12 DIAGNOSIS — R29898 Other symptoms and signs involving the musculoskeletal system: Secondary | ICD-10-CM

## 2024-05-12 NOTE — Therapy (Signed)
 OUTPATIENT OCCUPATIONAL THERAPY NEURO TREATMENT  Patient Name: Donald [name] MRN: [id number] DOB:10-Mar-1946, 78 y.o., male Today's Date: 05/12/2024  PCP: Kip Righter, MD REFERRING PROVIDER: Sherryl Bouchard, NP  END OF SESSION:  OT End of Session - 05/12/24 1400     Visit Number 11    Number of Visits 17    Date for Recertification  06/04/24    Authorization Type MCR A&B/AARP    Progress Note Due on Visit 10    OT Start Time 1403    OT Stop Time 1445    OT Time Calculation (min) 42 min    Activity Tolerance Patient tolerated treatment well    Behavior During Therapy WFL for tasks assessed/performed         Past Medical History:  Diagnosis Date   Diabetes mellitus without complication (HCC)    Dysrhythmia    Hyperlipemia    Hypertension    large right middle cerebral artery infarct, 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 s/p IV tPA, b. source unknown, c. loop recorder placed   Snoring 07/03/2015   Past Surgical History:  Procedure Laterality Date   LOOP RECORDER IMPLANT  06/07/2013   MDT LinQ implanted by Dr Kelsie for cryptogenic stroke   LOOP RECORDER IMPLANT N/A 06/07/2013   Procedure: LOOP RECORDER IMPLANT;  Surgeon: Lynwood JONETTA Kelsie, MD;  Location: [facility]ATH [facility];  Service: Cardiovascular;  Laterality: N/A;   TEE WITHOUT CARDIOVERSION N/A 06/07/2013   Procedure: TRANSESOPHAGEAL ECHOCARDIOGRAM (TEE);  Surgeon: Leim VEAR Moose, MD;  Location: [facility] ENDOSCOPY;  Service: Cardiovascular;  Laterality: N/A;   TONSILLECTOMY     TRANSANAL HEMORRHOIDAL DEARTERIALIZATION N/A 02/18/2023   Procedure: TRANSANAL HEMORRHOIDAL DEARTERIALIZATION;  Surgeon: Debby Hila, MD;  Location: [facility] ORS;  Service: General;  Laterality: N/A;   UMBILICAL HERNIA REPAIR     Patient Active Problem List   Diagnosis Date Noted   Permanent atrial fibrillation (HCC) 05/17/2019   Acquired thrombophilia 05/17/2019   Snoring 07/03/2015   Persistent atrial fibrillation (HCC) 04/16/2015   Hypokalemia 09/20/2014    HTN (hypertension) 09/19/2014   HLD (hyperlipidemia) 09/19/2014   Diabetes mellitus without complication (HCC) 09/19/2014   Jerking 09/19/2014   Alterations of sensations, late effect of cerebrovascular disease 11/07/2013   Disturbances of vision, late effect of cerebrovascular disease 11/07/2013   Sinus bradycardia 08/08/2013   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HCC) 07/26/2013   Left-sided neglect 07/26/2013   Small vessel disease 06/07/2013   Obesity, unspecified 06/07/2013   Cerebral infarction (HCC) 06/07/2013   large right middle cerebral artery infarct, 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   Hypertension 06/03/2013   Diabetes (HCC) 06/03/2013   ONSET DATE: 06/03/2013 (CVA), 03/15/2024 referral date  REFERRING DIAG: P30.645 (ICD-10-CM) - Hemiplegia and hemiparesis following cerebral infarction affecting left non-dominant side (HCC)  THERAPY DIAG:  Muscle weakness (generalized)  Other lack of coordination  Other disturbances of skin sensation  Other symptoms and signs involving the nervous system  Other symptoms and signs involving the musculoskeletal system  Hemiplegia and hemiparesis following cerebral infarction affecting left non-dominant side (HCC)  Rationale for Evaluation and Treatment: Rehabilitation  SUBJECTIVE:   SUBJECTIVE STATEMENT: Pt reports he has been more diligent with HEP completion completing at least once every other day.   Pt accompanied by: self  PERTINENT HISTORY: R MCA infarct 06/03/2013 S/p tPA, HTN, DM2, Afib, had 1 seizure in 2017   DIAGNOSTIC FINDINGS:  Brain MRI 09/20/2014 IMPRESSION: 1. No acute infarct. 2. Chronic right MCA territory infarct. 3. Subtle T2 hyperintensity in the left hippocampus  and left temporal lobe cortex versus artifact. Recent seizure activity is a consideration  PRECAUTIONS: Fall and Other: loop recorder, L sided weakness  WEIGHT BEARING RESTRICTIONS: No  PAIN:  Are you having pain? No  FALLS: Has patient fallen  in last 6 months? Yes. Number of falls 4 in the last year  LIVING ENVIRONMENT: Lives with: lives with their spouse Lives in: House/apartment 1 level Stairs: No Has following equipment at home: Single point cane, shower chair/bench with back, walk in shower  PLOF: Requires assistive device for independence  PATIENT GOALS: I'd like to be able to use my left hand more functionally. I want to improve my coordination and reaction time.  OBJECTIVE:  Note: Objective measures were completed at Evaluation unless otherwise noted.  HAND DOMINANCE: Right  ADLs: Overall ADLs: Independent  Equipment: Shower seat with back and Walk in shower  IADLs: Cooks and cleans bathrooms and kitchen Shopping:  Light housekeeping: Independent Meal Prep: Independent Community mobility: Wife completes  Medication management: Independent Landscape architect: Independent Handwriting: did not assess d/t nondominant L hand being affected  MOBILITY STATUS: ambulates with SPC  POSTURE COMMENTS:  rounded shoulders, flexed trunk , and weight shift right - mildly flexed trunk and right weight shift in standing and with ambulation, L shoulder more rounded than R w/ mild shoulder hike   ACTIVITY TOLERANCE: Activity tolerance: no changes  FUNCTIONAL OUTCOME MEASURES: PSFS: 05/05/2024:    UPPER EXTREMITY ROM:  WNL  UPPER EXTREMITY MMT:   4-/5 shoulder   HAND FUNCTION: Grip strength: Right: 73.1 lbs; Left: 58.2 lbs  COORDINATION: 9 Hole Peg test: Right: 26.32 sec; Left: 65.08 sec Box and Blocks:  Right 49 blocks, Left 29 blocks  SENSATION: Reports hypersensitivity in fingertips  EDEMA: Pt reports hx of edema but it is rare now.  MUSCLE TONE: LUE: Modifed Ashworth Scale 1 = Slight increase in muscle tone, manifested by a catch and release or by minimal resistance at the end of the range of motion when the affected part(s) is moved in flexion or extension  COGNITION: Overall cognitive status:  Within functional limits for tasks assessed  VISION: Subjective report: Pt reports having small cataracts but it is not affecting vision at this time Baseline vision: Wears glasses all the time Visual history: cataracts well kept  VISION ASSESSMENT: To be further assessed in functional context  PERCEPTION: WFL  PRAXIS: WFL  OBSERVATIONS: Impaired FM coordination, grip strength, strength in L hand and shoulder, hypersensitivity in fingertips of L hand                                                                                                                           TREATMENT:  Patient completed left grasp of ball for pick up of 25 marbles from table for improved grip strength. Pt then completed pick up of 5 additional marbles with more resistive ball for further strengthening.   Patient picked up single golf ball in palm of L hand  without use of digits for intrinsic strengthening, supinated golf ball, and then placed ball onto table x10 repetitions.  Holding Mini True Balance in left hand, patient attempted to stack all 8 discs on top of one another for improved coordination and proprioceptive input through affected extremity.  Patient was unable to complete stacking of discs requiring increased time and progressive stabilization of discs following alignment of each one. Pt completed on contralateral side for comparison.   With use of left, pt placed and then removed colored, large pegs into corresponding hole with use of pattern sheets for ROM, coordination, and strength of affected extremity.   Supination with tan (12.5 lbs) flex bar x 2 min for strength and endurance of affected extremity  Pronation with tan (12.5 lbs) flex bar x 2 min for strength and endurance of affected extremity   PATIENT EDUCATION: Education details: strengthening, ROM, proprioception Person educated: Patient Education method: Explanation, Demonstration, Tactile cues, and Verbal cues Education  comprehension: verbalized understanding, returned demonstration, verbal cues required, and tactile cues required  HOME EXERCISE PROGRAM: 04/04/24: Desensitization 04/07/24: Putty, coordination 04/14/24: Golf solitaire 11.17.25: Access Code: [email or web address] URL: [email or web address] Date: 04/18/2024 Prepared by: Rocky Dutch  Exercises - Standing Shoulder Horizontal Abduction with Resistance  - 1 x daily - 5 x weekly - 2 sets - 15 reps - Standing Shoulder Abduction with Self-Anchored Resistance  - 1 x daily - 5 x weekly - 2 sets - 15 reps - Standing Shoulder Flexion with Self-Anchored Resistance  - 1 x daily - 5 x weekly - 2 sets - 15 reps - Seated Shoulder Extension with Resistance  - 1 x daily - 5 x weekly - 2 sets - 10 reps   GOALS: Goals reviewed with patient? Yes  SHORT TERM GOALS: Target date: 05/04/24  Pt will be independent with HEP completion for coordination and strengthening Baseline: New to OP OT Goal status: MET  2.  Pt will be independent with desensitization techniques Baseline: Educated  Goal status: MET  3.  Pt will increase L shoulder strength to 4/5 Baseline: 4-/5  Goal status:  MET  4.  Pt will demonstrate improved coordination in L hand by a score of 34 blocks in Box and Blocks Test Baseline: Right 49 blocks, Left 29 blocks 05/05/2024: 36 blocks Goal status:  MET  LONG TERM GOALS: Target date: 06/04/24  Pt will demonstrate an increase of at least 2.0 score of average PSFS score or a 3 point increase in individual activity score in PSFS Baseline: 1.0 05/05/2024: 3.7 total score (see above for individual activity scores) Goal status:  MET  2.  Pt will demonstrate improved L hand FM coordination by a score of 60 seconds or less in 9HPT Baseline: Right: 26.32 sec; Left: 65.08 sec 05/05/2024: 59 seconds Goal status:  MET  3.  Pt will demonstrate improved grip strength in L hand by scoring at least 65 pounds Baseline: Right: 73.1 lbs; Left: 58.2  lbs 05/05/2024: 58.2 lbs Goal status:  IN PROGRESS  4.  Pt will demonstrate improved L coordination by a score of at least 39 blocks on Box and Blocks test Baseline: 29 blocks 05/05/2024: 36 blocks Goal status:  IN PROGRESS   ASSESSMENT:  CLINICAL IMPRESSION: Pt seen for OT today secondary to impaired functional use of LUE following chronic CVA. Good performance with use of LUE though required increased time and comparison of contralateral side. Remaining OT visits to focus on L hand strength and coordination to improve independence and safety with  ADL and IADL completion.  PERFORMANCE DEFICITS: in functional skills including IADLs, coordination, dexterity, sensation, strength, Fine motor control, Gross motor control, decreased knowledge of precautions, and UE functional use, cognitive skills including safety awareness, and psychosocial skills including coping strategies and environmental adaptation.   IMPAIRMENTS: are limiting patient from IADLs, work, and leisure.   CO-MORBIDITIES: may have co-morbidities  that affects occupational performance. Patient will benefit from skilled OT to address above impairments and improve overall function  REHAB POTENTIAL: Good  PLAN:  OT FREQUENCY: 2x/week  OT DURATION: 8 weeks  PLANNED INTERVENTIONS: 97168 OT Re-evaluation, 97535 self care/ADL training, 02889 therapeutic exercise, 97530 therapeutic activity, 97112 neuromuscular re-education, passive range of motion, psychosocial skills training, coping strategies training, patient/family education, and DME and/or AE instructions  RECOMMENDED OTHER SERVICES: none at this time  CONSULTED AND AGREED WITH PLAN OF CARE: Patient  PLAN FOR NEXT SESSION:  Coordination activities- gross motor activities  Blazepods Strengthening activities L shoulder and hand  Activities reducing sensitivity in L fingertips   Jocelyn CHRISTELLA Bottom, OT 05/12/2024, 2:53 PM

## 2024-05-16 ENCOUNTER — Ambulatory Visit

## 2024-05-16 DIAGNOSIS — M6281 Muscle weakness (generalized): Secondary | ICD-10-CM | POA: Diagnosis not present

## 2024-05-16 DIAGNOSIS — R208 Other disturbances of skin sensation: Secondary | ICD-10-CM

## 2024-05-16 DIAGNOSIS — R278 Other lack of coordination: Secondary | ICD-10-CM

## 2024-05-16 NOTE — Therapy (Signed)
 OUTPATIENT OCCUPATIONAL THERAPY NEURO TREATMENT  Patient Name: Donald [name] MRN: [id number] DOB:[birth year], 78 y.o., male Today's Date: 05/16/2024  PCP: Kip Righter, MD REFERRING PROVIDER: Sherryl Bouchard, NP  END OF SESSION:  OT End of Session - 05/16/24 1529     Visit Number 12    Number of Visits 17    Date for Recertification  06/04/24    Authorization Type MCR A&B/AARP    OT Start Time 1449    OT Stop Time 1529    OT Time Calculation (min) 40 min    Equipment Utilized During Treatment green theraputty, velcro board    Activity Tolerance Patient tolerated treatment well    Behavior During Therapy WFL for tasks assessed/performed           Past Medical History:  Diagnosis Date   Diabetes mellitus without complication (HCC)    Dysrhythmia    Hyperlipemia    Hypertension    large right middle cerebral artery infarct, 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 s/p IV tPA, b. source unknown, c. loop recorder placed   Snoring 07/03/2015   Past Surgical History:  Procedure Laterality Date   LOOP RECORDER IMPLANT  06/07/2013   MDT LinQ implanted by Dr Kelsie for cryptogenic stroke   LOOP RECORDER IMPLANT N/A 06/07/2013   Procedure: LOOP RECORDER IMPLANT;  Surgeon: Lynwood JONETTA Kelsie, MD;  Location: [facility] CATH LAB;  Service: Cardiovascular;  Laterality: N/A;   TEE WITHOUT CARDIOVERSION N/A 06/07/2013   Procedure: TRANSESOPHAGEAL ECHOCARDIOGRAM (TEE);  Surgeon: Leim VEAR Moose, MD;  Location: [facility] ENDOSCOPY;  Service: Cardiovascular;  Laterality: N/A;   TONSILLECTOMY     TRANSANAL HEMORRHOIDAL DEARTERIALIZATION N/A 02/18/2023   Procedure: TRANSANAL HEMORRHOIDAL DEARTERIALIZATION;  Surgeon: Debby Hila, MD;  Location: [facility] ORS;  Service: General;  Laterality: N/A;   UMBILICAL HERNIA REPAIR     Patient Active Problem List   Diagnosis Date Noted   Permanent atrial fibrillation (HCC) 05/17/2019   Acquired thrombophilia 05/17/2019   Snoring 07/03/2015   Persistent atrial fibrillation (HCC)  04/16/2015   Hypokalemia 09/20/2014   HTN (hypertension) 09/19/2014   HLD (hyperlipidemia) 09/19/2014   Diabetes mellitus without complication (HCC) 09/19/2014   Jerking 09/19/2014   Alterations of sensations, late effect of cerebrovascular disease 11/07/2013   Disturbances of vision, late effect of cerebrovascular disease 11/07/2013   Sinus bradycardia 08/08/2013   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HCC) 07/26/2013   Left-sided neglect 07/26/2013   Small vessel disease 06/07/2013   Obesity, unspecified 06/07/2013   Cerebral infarction (HCC) 06/07/2013   large right middle cerebral artery infarct, 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   Pt accompanied by: self  PERTINENT HISTORY: R MCA infarct 06/03/2013 S/p tPA, HTN, DM2, Afib, had 1 seizure in 2017   DIAGNOSTIC FINDINGS:  Brain MRI 09/20/2014 IMPRESSION: 1. No acute infarct. 2. Chronic right MCA territory infarct. 3. Subtle T2 hyperintensity in the left hippocampus and left temporal lobe cortex versus artifact. Recent seizure activity is a consideration  PRECAUTIONS: Fall and Other: loop recorder, L sided weakness  WEIGHT BEARING  RESTRICTIONS: No  PAIN:  Are you having pain? No  FALLS: Has patient fallen in last 6 months? Yes. Number of falls 4 in the last year  LIVING ENVIRONMENT: Lives with: lives with their spouse Lives in: House/apartment 1 level Stairs: No Has following  equipment at home: Single point cane, shower chair/bench with back, walk in shower  PLOF: Requires assistive device for independence  PATIENT GOALS: I'd like to be able to use my left hand more functionally. I want to improve my coordination and reaction time.  OBJECTIVE:  Note: Objective measures were completed at Evaluation unless otherwise noted.  HAND DOMINANCE: Right  ADLs: Overall ADLs: Independent  Equipment: Shower seat with back and Walk in shower  IADLs: Cooks and cleans bathrooms and kitchen Shopping:  Light housekeeping: Independent Meal Prep: Independent Community mobility: Wife completes  Medication management: Independent Landscape architect: Independent Handwriting: did not assess d/t nondominant L hand being affected  MOBILITY STATUS: ambulates with SPC  POSTURE COMMENTS:  rounded shoulders, flexed trunk , and weight shift right - mildly flexed trunk and right weight shift in standing and with ambulation, L shoulder more rounded than R w/ mild shoulder hike   ACTIVITY TOLERANCE: Activity tolerance: no changes  FUNCTIONAL OUTCOME MEASURES: PSFS: 05/05/2024:    UPPER EXTREMITY ROM:  WNL  UPPER EXTREMITY MMT:   4-/5 shoulder   HAND FUNCTION: Grip strength: Right: 73.1 lbs; Left: 58.2 lbs  COORDINATION: 9 Hole Peg test: Right: 26.32 sec; Left: 65.08 sec Box and Blocks:  Right 49 blocks, Left 29 blocks  SENSATION: Reports hypersensitivity in fingertips  EDEMA: Pt reports hx of edema but it is rare now.  MUSCLE TONE: LUE: Modifed Ashworth Scale 1 = Slight increase in muscle tone, manifested by a catch and release or by minimal resistance at the end of the range of motion when the affected part(s) is moved in flexion or extension  COGNITION: Overall cognitive status: Within functional limits for tasks assessed  VISION: Subjective report: Pt reports having small cataracts but it is not affecting vision at this time Baseline vision: Wears glasses  all the time Visual history: cataracts well kept  VISION ASSESSMENT: To be further assessed in functional context  PERCEPTION: WFL  PRAXIS: WFL  OBSERVATIONS: Impaired FM coordination, grip strength, strength in L hand and shoulder, hypersensitivity in fingertips of L hand                                                                                                                           TREATMENT:  - Manual therapy completed for duration as noted below including:   Pt educated in purpose of vibration for desensitization purposes, applied to each fingertip.   - Therapeutic activities completed for duration as noted below including:   Pt completed gross grasp removing pegs from pegboard with LUE and clamp at level 3 resistance with silver coil.  Pt completed pronation/supination of LUE moving velcro pieces across board with hard side of  velcro to improve grip/hand strength. Next completed desensitization on soft and hard side of velcro on each fingertip.  Finally added additional exercise with green theraputty, instructed in finger spreading to improve hand strength further. Provided verbal instruction and visual demo, pt returned demonstration.  PATIENT EDUCATION: Education details:  Person educated: Patient Education method: Explanation, Facilities Manager, Actor cues, and Verbal cues Education comprehension: verbalized understanding, returned demonstration, verbal cues required, and tactile cues required  HOME EXERCISE PROGRAM: 04/04/24: Desensitization 04/07/24: Putty, coordination 04/14/24: Golf solitaire 11.17.25: Access Code: [email or web address] URL: [email or web address] Date: 04/18/2024 Prepared by: Rocky Dutch  Exercises - Standing Shoulder Horizontal Abduction with Resistance  - 1 x daily - 5 x weekly - 2 sets - 15 reps - Standing Shoulder Abduction with Self-Anchored Resistance  - 1 x daily - 5 x weekly - 2 sets - 15 reps - Standing Shoulder Flexion with  Self-Anchored Resistance  - 1 x daily - 5 x weekly - 2 sets - 15 reps - Seated Shoulder Extension with Resistance  - 1 x daily - 5 x weekly - 2 sets - 10 reps   GOALS: Goals reviewed with patient? Yes  SHORT TERM GOALS: Target date: 05/04/24  Pt will be independent with HEP completion for coordination and strengthening Baseline: New to OP OT Goal status: MET  2.  Pt will be independent with desensitization techniques Baseline: Educated  Goal status: MET  3.  Pt will increase L shoulder strength to 4/5 Baseline: 4-/5  Goal status:  MET  4.  Pt will demonstrate improved coordination in L hand by a score of 34 blocks in Box and Blocks Test Baseline: Right 49 blocks, Left 29 blocks 05/05/2024: 36 blocks Goal status:  MET  LONG TERM GOALS: Target date: 06/04/24  Pt will demonstrate an increase of at least 2.0 score of average PSFS score or a 3 point increase in individual activity score in PSFS Baseline: 1.0 05/05/2024: 3.7 total score (see above for individual activity scores) Goal status:  MET  2.  Pt will demonstrate improved L hand FM coordination by a score of 60 seconds or less in 9HPT Baseline: Right: 26.32 sec; Left: 65.08 sec 05/05/2024: 59 seconds Goal status:  MET  3.  Pt will demonstrate improved grip strength in L hand by scoring at least 65 pounds Baseline: Right: 73.1 lbs; Left: 58.2 lbs 05/05/2024: 58.2 lbs Goal status:  IN PROGRESS  4.  Pt will demonstrate improved L coordination by a score of at least 39 blocks on Box and Blocks test Baseline: 29 blocks 05/05/2024: 36 blocks Goal status:  IN PROGRESS   ASSESSMENT:  CLINICAL IMPRESSION: Pt seen for OT today secondary to impaired functional use of LUE following chronic CVA. Good performance with use of LUE though required increased time and comparison of contralateral side. Remaining OT visits to focus on L hand strength and coordination to improve independence and safety with ADL and IADL  completion.  PERFORMANCE DEFICITS: in functional skills including IADLs, coordination, dexterity, sensation, strength, Fine motor control, Gross motor control, decreased knowledge of precautions, and UE functional use, cognitive skills including safety awareness, and psychosocial skills including coping strategies and environmental adaptation.   IMPAIRMENTS: are limiting patient from IADLs, work, and leisure.   CO-MORBIDITIES: may have co-morbidities  that affects occupational performance. Patient will benefit from skilled OT to address above impairments and improve overall function  REHAB POTENTIAL: Good  PLAN:  OT FREQUENCY: 2x/week  OT DURATION: 8 weeks  PLANNED INTERVENTIONS: 02831 OT Re-evaluation, [phone]  self care/ADL training, 02889 therapeutic exercise, 97530 therapeutic activity, 97112 neuromuscular re-education, passive range of motion, psychosocial skills training, coping strategies training, patient/family education, and DME and/or AE instructions  RECOMMENDED OTHER SERVICES: none at this time  CONSULTED AND AGREED WITH PLAN OF CARE: Patient  PLAN FOR NEXT SESSION:  Strengthening activities L shoulder and hand  Activities reducing sensitivity in L fingertips   Rocky Dutch, OT 05/16/2024, 3:31 PM

## 2024-05-19 ENCOUNTER — Ambulatory Visit

## 2024-05-19 ENCOUNTER — Encounter: Payer: Self-pay | Admitting: Physical Therapy

## 2024-05-19 ENCOUNTER — Ambulatory Visit: Admitting: Physical Therapy

## 2024-05-19 DIAGNOSIS — M6281 Muscle weakness (generalized): Secondary | ICD-10-CM | POA: Diagnosis not present

## 2024-05-19 DIAGNOSIS — R208 Other disturbances of skin sensation: Secondary | ICD-10-CM

## 2024-05-19 DIAGNOSIS — R29898 Other symptoms and signs involving the musculoskeletal system: Secondary | ICD-10-CM

## 2024-05-19 DIAGNOSIS — R278 Other lack of coordination: Secondary | ICD-10-CM

## 2024-05-19 DIAGNOSIS — R29818 Other symptoms and signs involving the nervous system: Secondary | ICD-10-CM

## 2024-05-19 NOTE — Therapy (Signed)
 OUTPATIENT PHYSICAL THERAPY NEURO TREATMENT   Patient Name: Donald [name] MRN: [id number] DOB:1945/09/22, 78 y.o., male Today's Date: 05/19/2024   PCP: Kip Righter, MD REFERRING PROVIDER: Sherryl Bouchard, NP  END OF SESSION:  PT End of Session - 05/19/24 1541     Visit Number 19    Number of Visits 25   17+8   Date for Recertification  06/10/24   pushed out due to scheduling delay (holidays)   Authorization Type Medicare A&B w/ [employer] supplement    Progress Note Due on Visit 10    PT Start Time 1535    PT Stop Time 1615    PT Time Calculation (min) 40 min    Equipment Utilized During Treatment Gait belt    Activity Tolerance Patient tolerated treatment well    Behavior During Therapy WFL for tasks assessed/performed          Past Medical History:  Diagnosis Date   Diabetes mellitus without complication (HCC)    Dysrhythmia    Hyperlipemia    Hypertension    large right middle cerebral artery infarct, 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 s/p IV tPA, b. source unknown, c. loop recorder placed   Snoring 07/03/2015   Past Surgical History:  Procedure Laterality Date   LOOP RECORDER IMPLANT  06/07/2013   MDT LinQ implanted by Dr Kelsie for cryptogenic stroke   LOOP RECORDER IMPLANT N/A 06/07/2013   Procedure: LOOP RECORDER IMPLANT;  Surgeon: Lynwood JONETTA Kelsie, MD;  Location: [facility] CATH [facility];  Service: Cardiovascular;  Laterality: N/A;   TEE WITHOUT CARDIOVERSION N/A 06/07/2013   Procedure: TRANSESOPHAGEAL ECHOCARDIOGRAM (TEE);  Surgeon: Leim VEAR Moose, MD;  Location: [facility] ENDOSCOPY;  Service: Cardiovascular;  Laterality: N/A;   TONSILLECTOMY     TRANSANAL HEMORRHOIDAL DEARTERIALIZATION N/A 02/18/2023   Procedure: TRANSANAL HEMORRHOIDAL DEARTERIALIZATION;  Surgeon: Debby Hila, MD;  Location: [facility] ORS;  Service: General;  Laterality: N/A;   UMBILICAL HERNIA REPAIR     Patient Active Problem List   Diagnosis Date Noted   Permanent atrial fibrillation (HCC) 05/17/2019    Acquired thrombophilia 05/17/2019   Snoring 07/03/2015   Persistent atrial fibrillation (HCC) 04/16/2015   Hypokalemia 09/20/2014   HTN (hypertension) 09/19/2014   HLD (hyperlipidemia) 09/19/2014   Diabetes mellitus without complication (HCC) 09/19/2014   Jerking 09/19/2014   Alterations of sensations, late effect of cerebrovascular disease 11/07/2013   Disturbances of vision, late effect of cerebrovascular disease 11/07/2013   Sinus bradycardia 08/08/2013   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HCC) 07/26/2013   Left-sided neglect 07/26/2013   Small vessel disease 06/07/2013   Obesity, unspecified 06/07/2013   Cerebral infarction (HCC) 06/07/2013   large right middle cerebral artery infarct, 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   Hypertension 06/03/2013   Diabetes (HCC) 06/03/2013    ONSET DATE: 06/03/2013 (CVA)  REFERRING DIAG: P30.645 (ICD-10-CM) - Hemiparesis affecting left side as late effect of cerebrovascular accident (HCC)  THERAPY DIAG:  Muscle weakness (generalized)  Other lack of coordination  Other disturbances of skin sensation  Other symptoms and signs involving the nervous system  Other symptoms and signs involving the musculoskeletal system  Rationale for Evaluation and Treatment: Rehabilitation  SUBJECTIVE:  SUBJECTIVE STATEMENT: [name]  Pt reports feeling stronger and his walking being the best it has been since Sept of 2024.  His hand is still sensitive but feels good today.  His brother gave him the suggestion to restring his guitar with ultra light strings and work on songs with only 3 chords to help build his tolerance to playing again.  He denies falls or acute status changes. Pt accompanied by: self (Wife - waiting in car)  PERTINENT HISTORY: R MCA infarct 06/03/2013 S/p  tPA, HTN, DM2, Afib, had 1 seizure in 2017  PAIN:  Are you having pain? No  PRECAUTIONS: Fall and Other: loop recorder  RED FLAGS: None   WEIGHT BEARING RESTRICTIONS: No  FALLS: Has patient fallen in last 6 months? Yes. Number of falls 3  LIVING ENVIRONMENT: Lives with: lives with their spouse Lives in: House/apartment Stairs: No Has following equipment at home: Single point cane  PLOF: Requires assistive device for independence  PATIENT GOALS: To work on reaction time and getting off the floor  OBJECTIVE:  Note: Objective measures were completed at Evaluation unless otherwise noted.  DIAGNOSTIC FINDINGS:  Brain MRI 09/20/2014 IMPRESSION: 1. No acute infarct. 2. Chronic right MCA territory infarct. 3. Subtle T2 hyperintensity in the left hippocampus and left temporal lobe cortex versus artifact. Recent seizure activity is a consideration.  COGNITION: Overall cognitive status: History of cognitive impairments - at baseline - reports quicker to anger/verbalize emotions/sometimes overstimulated with sounds and distractions   SENSATION: Light touch: WFL - LLE hypersensitive/question of allodynia  COORDINATION: Mildly dysmetric LLE  EDEMA:  None significant in BLE  MUSCLE TONE: LLE: Modifed Ashworth Scale 1+ = Slight increase in muscle tone, manifested by a catch, followed by minimal resistance throughout the remainder (less than half) of the ROM  POSTURE: rounded shoulders, flexed trunk , and weight shift right - mildly flexed trunk and right weight shift in standing and with ambulation, L shoulder more rounded than R w/ mild shoulder hike  LOWER EXTREMITY ROM:     Active  Right Eval Left Eval  Hip flexion WNL WFL  Hip extension    Hip abduction    Hip adduction    Hip internal rotation    Hip external rotation    Knee flexion    Knee extension    Ankle dorsiflexion  2+  Ankle plantarflexion    Ankle inversion    Ankle eversion     (Blank rows = not  tested)  LOWER EXTREMITY MMT:    MMT Right Eval Left Eval  Hip flexion    Hip extension    Hip abduction    Hip adduction    Hip internal rotation    Hip external rotation    Knee flexion    Knee extension    Ankle dorsiflexion    Ankle plantarflexion    Ankle inversion    Ankle eversion    (Blank rows = not tested)  BED MOBILITY:  Findings: Sit to supine Complete Independence Supine to sit Complete Independence Rolling to Right Complete Independence Rolling to Left Complete Independence - needs increased time and momentum for L and R Ind w/ features of tempurpedic style bed  TRANSFERS: Sit to stand: SBA  Assistive device utilized: Single point cane     Stand to sit: SBA  Assistive device utilized: Single point cane     Chair to chair: SBA  Assistive device utilized: Single point cane       RAMP:  Not tested  CURB:  Not tested  STAIRS: Not tested GAIT: Findings: Distance walked: various clinic distances and Comments: Pt has mild right reliance w/ SPC, no foot scuff/toe catch even without AFO, shortened stride and slightly diminished flexion pattern of LLE  FUNCTIONAL TESTS:  5 times sit to stand: 18.89 sec w/ heavy right trunk shortening and RUE reliance w/ L toe down vs full weight bearing  PATIENT SURVEYS:  ABC scale: The Activities-Specific Balance Confidence (ABC) Scale TBA                                                                                                                              TREATMENT DATE: 05-19-24 -Hamstring pushbacks w/ PT facilitating knee posture w/ firm strap 2x12; PT facilitating posture to improve posterior change engagement -2 offset squat alt LE x20 -Wide STS no UE support x6, x4 -Seated bimanual 2lb floor reach (hinge technique) into reverse fly x10 -Seated bimanual 2lb floor reach (hinge technique) into lateral weight shift x8 each side  PATIENT EDUCATION: Education details:  Continue HEP. Person educated:  Patient Education method: Medical Illustrator Education comprehension: verbalized understanding and needs further education  HOME EXERCISE PROGRAM: Access Code: [facility] URL: [email or web address] Date: 03/21/2024 Prepared by: Daved Bull  Exercises - Seated Toe Raise  - 1-2 x daily - 4 x weekly - 2 sets - 10 reps - 2-3 seconds hold - Ankle Inversion Eversion Towel Slide  - 1-2 x daily - 4 x weekly - 2 sets - 10 reps - Seated Ankle Circles  - 1-2 x daily - 4 x weekly - 2 sets - 10 reps - Seated Heel Raise  - 1-2 x daily - 4 x weekly - 2 sets - 10 reps - 2-3 seconds hold - Seated Ankle Inversion Eversion PROM  - 1 x daily - 7 x weekly - 2 sets - 10 reps - Staggered Sit-to-Stand  - 1 x daily - 4-5 x weekly - 2-3 sets - 10 reps - Seated Hamstring Stretch  - 1 x daily - 4-5 x weekly - 1 sets - 3-4 reps - 45 seconds hold - Hooklying Single Knee to Chest Stretch with Towel  - 1 x daily - 4-5 x weekly - 1 sets - 3-4 reps - 45 seconds hold - Seated Toe Towel Scrunches  - 1 x daily - 4 x weekly - 1-2 sets - 10 reps - Seated Arch Lifts  - 1 x daily - 4 x weekly - 1-2 sets - 10 reps  Desensitization Techniques: -Light touch/pressure:  using fingertip pressure to slowly move up small segments of the affected body part until outside the area of pain and then back to where you started -Deep pressure:  using fingertips to squeeze in small segments starting outside the affected area, crossing over the affected area, and then to the other side of the affected area -Tapping:  fingertips tap with firm pressure over the affected area, can move around the  affected area as well -Brushing/scratching:  use fingertips to brush/scratch over and around the affected area -Textures:  use soft and rough textured items like cotton balls vs wash cloths to brush or rub with light into firm pressure over and around the affected area  Repeat these 3-4x per day.   GOALS: Goals reviewed  with patient? Yes  SHORT TERM GOALS: Target date: 04/08/2024  Pt will be independent and compliant with introductory strength and balance HEP in order to maintain functional progress and improve mobility. Baseline:  IND and compliant (11/26) Goal status: MET  2.  Pt will demonstrate adequate balance strategies to maintain upright with forward/overhead/and floor reaching. Baseline:  Very narrow limits of stability - several near falls related to this; ongoing (11/26) Goal status: IN PROGRESS  3.  Pt will be independent with desensitization strategies for LLE hypersensitivity management. Baseline: Provided - pt has handout and actively working on in OT/PT (11/26) Goal status: MET  LONG TERM GOALS: Target date: 05/06/2024  Pt will be independent and compliant with advanced and finalized strength and balance HEP in order to maintain functional progress and improve mobility. Baseline: To be established; pt feels he is doing moderately well with frequency of his HEP (12/4) Goal status: PARTIALLY MET  2.  Patient will improve ABC Scale score to >/=76% in order to demonstrate decreased risk for falls and increased confidence in balance. Baseline: 66.875% (10/14); 80% (12/4) Goal status: MET  3.  Patient will demonstrate floor recovery w/ no more than SBA in order to improve safety and home management. Baseline: Reports recent minA even w/ support surface; minA to flip from bottom to knees using support surface (12/4) Goal status: IN PROGRESS  4.  Pt will decrease 5xSTS to </=12 seconds w/ improved midline mechanics in order to demonstrate decreased risk for falls and improved functional bilateral LE strength and power. Baseline: 18.89 sec w/ heavy right trunk shortening and RUE reliance w/ L toe down vs full weight bearing; 13.97 sec w/ light BUE support and mildly R rear staggered stance (12/4) Goal status: PARTIALLY MET  GOALS (at 87/5 re-cert): Goals reviewed with patient? Yes SHORT TERM  GOALS = LONG TERM GOALS: Target date: 06/03/2024  Pt will be independent and compliant with advanced and finalized strength and balance HEP in order to maintain functional progress and improve mobility. Baseline: To be established; pt feels he is doing moderately well with frequency of his HEP (12/4) Goal status: ONGOING  2.  Patient will improve ABC Scale score to >/=90% in order to demonstrate decreased risk for falls and increased confidence in balance. Baseline: 66.875% (10/14); 80% (12/4) Goal status: INITIAL  3.  Patient will demonstrate floor recovery w/ no more than SBA in order to improve safety and home management. Baseline: Reports recent minA even w/ support surface; minA to flip from bottom to knees using support surface (12/4) Goal status: ONGOING  4.  Pt will decrease 5xSTS to </=12 seconds w/ improved midline mechanics in order to demonstrate decreased risk for falls and improved functional bilateral LE strength and power. Baseline: 18.89 sec w/ heavy right trunk shortening and RUE reliance w/ L toe down vs full weight bearing; 13.97 sec w/ light BUE support and mildly R rear staggered stance (12/4) Goal status: ONGOING  ASSESSMENT:  CLINICAL IMPRESSION: Focus of skilled PT session on addressing ongoing hamstring tightness and tone in LLE.  He does much better with hamstring pushbacks and offset squat form today likely due to not being  fatigued prior to task.  Wide STS were greatly fatiguing for him, but posed appropriate challenge that he may benefit from repeating these in future.  PT to further address isolated pelvic mechanics and LE strength and flexibility to improve fluidity of gait and minimize fall risk.  Continue per POC.  OBJECTIVE IMPAIRMENTS: Abnormal gait, decreased activity tolerance, decreased balance, decreased cognition, decreased coordination, decreased endurance, decreased knowledge of use of DME, decreased strength, impaired sensation, impaired tone, improper  body mechanics, and postural dysfunction.   ACTIVITY LIMITATIONS: carrying, lifting, bending, squatting, stairs, transfers, reach over head, and locomotion level  PARTICIPATION LIMITATIONS: laundry, shopping, and community activity  PERSONAL FACTORS: Age, Fitness, Past/current experiences, Time since onset of injury/illness/exacerbation, and 1-2 comorbidities: HTN, Afib are also affecting patient's functional outcome.   REHAB POTENTIAL: Good  CLINICAL DECISION MAKING: Stable/uncomplicated  EVALUATION COMPLEXITY: Moderate  PLAN:  PT FREQUENCY: 2x/week + 2x/wk  PT DURATION: 8 weeks + 4 wks  PLANNED INTERVENTIONS: [id number]- PT Re-evaluation, 97750- Physical Performance Testing, 97110-Therapeutic exercises, 97530- Therapeutic activity, [id number]- Neuromuscular re-education, 97535- Self Care, [id number]- Manual therapy, [id number]- Gait training, [phone]- Orthotic Initial, [id number]- Orthotic/Prosthetic subsequent, [phone]- Aquatic Therapy, [phone]- Electrical stimulation (manual), Patient/Family education, Balance training, Stair training, Taping, Joint mobilization, Vestibular training, and DME instructions  PLAN FOR NEXT SESSION: Address orthotic if pt requests!  Floor recovery - incorporate tight space setup - repeat w/ full transition to bottom and incorporate LLE use to recover to standing - pt wants to work towards no UE support if possible (did discuss limitations and safety recs 11/20).  LLE NMR/strength/reaction time.  Expand strength and balance HEP.  Core strength - work mat table level on tasks to improve floor recovery from stomach and supine.  Reaching floor/forward/overhead. Perturbations/resisted gait/farmer's carry. Try ladder? Glut strengthening.  Pt felt challenged w/: Wide STS Offset squats/mini lunges Hamstring pushbacks - strap to prevent squatting Wedge STS progress to no UE support Stairs/step taps/up; incline step through Standing 10lb kettlebell hip flexion LLE (isometric DF) Walking  beanbag DF SLS taps to top step on L leg > L step ups Prolonged L stance 6 step w/ PT facilitating ankle posture Decline RLE step through working into lengthened stride 10 minutes on core at end of session - standing?   Pt declines offer of aquatic therapy at evaluation.  Daved KATHEE Bull, PT, DPT 05/19/2024, 4:18 PM

## 2024-05-19 NOTE — Therapy (Signed)
 OUTPATIENT OCCUPATIONAL THERAPY NEURO TREATMENT  Patient Name: Donald [name] MRN: [id number] DOB:24-Oct-1945, 78 y.o., male Today's Date: 05/19/2024  PCP: Kip Righter, MD REFERRING PROVIDER: Sherryl Bouchard, NP  END OF SESSION:  OT End of Session - 05/19/24 1457     Visit Number 13    Number of Visits 17    Date for Recertification  06/04/24    Authorization Type MCR A&B/[employer]    OT Start Time 1445    OT Stop Time 1530    OT Time Calculation (min) 45 min    Equipment Utilized During Treatment rice board    Activity Tolerance Patient tolerated treatment well    Behavior During Therapy WFL for tasks assessed/performed            Past Medical History:  Diagnosis Date   Diabetes mellitus without complication (HCC)    Dysrhythmia    Hyperlipemia    Hypertension    large right middle cerebral artery infarct, 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 s/p IV tPA, b. source unknown, c. loop recorder placed   Snoring 07/03/2015   Past Surgical History:  Procedure Laterality Date   LOOP RECORDER IMPLANT  06/07/2013   MDT LinQ implanted by Dr Kelsie for cryptogenic stroke   LOOP RECORDER IMPLANT N/A 06/07/2013   Procedure: LOOP RECORDER IMPLANT;  Surgeon: Lynwood JONETTA Kelsie, MD;  Location: [facility];  Service: Cardiovascular;  Laterality: N/A;   TEE WITHOUT CARDIOVERSION N/A 06/07/2013   Procedure: TRANSESOPHAGEAL ECHOCARDIOGRAM (TEE);  Surgeon: Leim VEAR Moose, MD;  Location: [facility] ENDOSCOPY;  Service: Cardiovascular;  Laterality: N/A;   TONSILLECTOMY     TRANSANAL HEMORRHOIDAL DEARTERIALIZATION N/A 02/18/2023   Procedure: TRANSANAL HEMORRHOIDAL DEARTERIALIZATION;  Surgeon: Debby Hila, MD;  Location: [facility] ORS;  Service: General;  Laterality: N/A;   UMBILICAL HERNIA REPAIR     Patient Active Problem List   Diagnosis Date Noted   Permanent atrial fibrillation (HCC) 05/17/2019   Acquired thrombophilia 05/17/2019   Snoring 07/03/2015   Persistent atrial fibrillation (HCC) 04/16/2015    Hypokalemia 09/20/2014   HTN (hypertension) 09/19/2014   HLD (hyperlipidemia) 09/19/2014   Diabetes mellitus without complication (HCC) 09/19/2014   Jerking 09/19/2014   Alterations of sensations, late effect of cerebrovascular disease 11/07/2013   Disturbances of vision, late effect of cerebrovascular disease 11/07/2013   Sinus bradycardia 08/08/2013   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HCC) 07/26/2013   Left-sided neglect 07/26/2013   Small vessel disease 06/07/2013   Obesity, unspecified 06/07/2013   Cerebral infarction (HCC) 06/07/2013   large right middle cerebral artery infarct, 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 Pt reports he can tell a little bit of difference but not much.  Pt accompanied by: self  PERTINENT HISTORY: R MCA infarct 06/03/2013 S/p tPA, HTN, DM2, Afib, had 1 seizure in 2017   DIAGNOSTIC FINDINGS:  Brain MRI 09/20/2014 IMPRESSION: 1. No acute infarct. 2. Chronic right MCA territory infarct. 3. Subtle T2 hyperintensity in the left hippocampus and left temporal lobe cortex versus artifact. Recent seizure activity is a consideration  PRECAUTIONS: Fall and Other: loop recorder, L sided weakness  WEIGHT  BEARING RESTRICTIONS: No  PAIN:  Are you having pain? No  FALLS: Has patient fallen in last 6 months? Yes. Number of falls 4 in the last year  LIVING ENVIRONMENT: Lives with: lives with their spouse Lives in: House/apartment 1 level Stairs: No Has following  equipment at home: Single point cane, shower chair/bench with back, walk in shower  PLOF: Requires assistive device for independence  PATIENT GOALS: I'd like to be able to use my left hand more functionally. I want to improve my coordination and reaction time.  OBJECTIVE:  Note: Objective measures were completed at Evaluation unless otherwise noted.  HAND DOMINANCE: Right  ADLs: Overall ADLs: Independent  Equipment: Shower seat with back and Walk in shower  IADLs: Cooks and cleans bathrooms and kitchen Shopping:  Light housekeeping: Independent Meal Prep: Independent Community mobility: Wife completes  Medication management: Independent Landscape architect: Independent Handwriting: did not assess d/t nondominant L hand being affected  MOBILITY STATUS: ambulates with SPC  POSTURE COMMENTS:  rounded shoulders, flexed trunk , and weight shift right - mildly flexed trunk and right weight shift in standing and with ambulation, L shoulder more rounded than R w/ mild shoulder hike   ACTIVITY TOLERANCE: Activity tolerance: no changes  FUNCTIONAL OUTCOME MEASURES: PSFS: 05/05/2024:    UPPER EXTREMITY ROM:  WNL  UPPER EXTREMITY MMT:   4-/5 shoulder   HAND FUNCTION: Grip strength: Right: 73.1 lbs; Left: 58.2 lbs  COORDINATION: 9 Hole Peg test: Right: 26.32 sec; Left: 65.08 sec Box and Blocks:  Right 49 blocks, Left 29 blocks  SENSATION: Reports hypersensitivity in fingertips  EDEMA: Pt reports hx of edema but it is rare now.  MUSCLE TONE: LUE: Modifed Ashworth Scale 1 = Slight increase in muscle tone, manifested by a catch and release or by minimal resistance at the end of the range of motion when the affected part(s) is moved in flexion or extension  COGNITION: Overall cognitive status: Within functional limits for tasks assessed  VISION: Subjective report: Pt reports having small cataracts but it is not affecting vision at this time Baseline vision: Wears glasses  all the time Visual history: cataracts well kept  VISION ASSESSMENT: To be further assessed in functional context  PERCEPTION: WFL  PRAXIS: WFL  OBSERVATIONS: Impaired FM coordination, grip strength, strength in L hand and shoulder, hypersensitivity in fingertips of L hand                                                                                                                           TREATMENT:   - Therapeutic activities completed for duration as noted below including:   Pt submerged L hand in rice bin and retrieved 10 items from rice for desensitization purposes.  Next picked up small perfection game items and inserted into game board.  Pt engaged in further desensitization exposing L hand to different textures for improved ability to pluck strings of guitar. Pt also completed game pulling taut string to move targets  across board to simulate strumming guitar.    PATIENT EDUCATION: Education details:  Person educated: Patient Education method: Explanation, Facilities Manager, Actor cues, and Verbal cues Education comprehension: verbalized understanding, returned demonstration, verbal cues required, and tactile cues required  HOME EXERCISE PROGRAM: 04/04/24: Desensitization 04/07/24: Putty, coordination 04/14/24: Golf solitaire 11.17.25: Access Code: [email or web address] URL: [email or web address] Date: 04/18/2024 Prepared by: Rocky Dutch  Exercises - Standing Shoulder Horizontal Abduction with Resistance  - 1 x daily - 5 x weekly - 2 sets - 15 reps - Standing Shoulder Abduction with Self-Anchored Resistance  - 1 x daily - 5 x weekly - 2 sets - 15 reps - Standing Shoulder Flexion with Self-Anchored Resistance  - 1 x daily - 5 x weekly - 2 sets - 15 reps - Seated Shoulder Extension with Resistance  - 1 x daily - 5 x weekly - 2 sets - 10 reps   GOALS: Goals reviewed with patient? Yes  SHORT TERM GOALS: Target date: 05/04/24  Pt will be independent with HEP  completion for coordination and strengthening Baseline: New to OP OT Goal status: MET  2.  Pt will be independent with desensitization techniques Baseline: Educated  Goal status: MET  3.  Pt will increase L shoulder strength to 4/5 Baseline: 4-/5  Goal status:  MET  4.  Pt will demonstrate improved coordination in L hand by a score of 34 blocks in Box and Blocks Test Baseline: Right 49 blocks, Left 29 blocks 05/05/2024: 36 blocks Goal status:  MET  LONG TERM GOALS: Target date: 06/04/24  Pt will demonstrate an increase of at least 2.0 score of average PSFS score or a 3 point increase in individual activity score in PSFS Baseline: 1.0 05/05/2024: 3.7 total score (see above for individual activity scores) Goal status:  MET  2.  Pt will demonstrate improved L hand FM coordination by a score of 60 seconds or less in 9HPT Baseline: Right: 26.32 sec; Left: 65.08 sec 05/05/2024: 59 seconds Goal status:  MET  3.  Pt will demonstrate improved grip strength in L hand by scoring at least 65 pounds Baseline: Right: 73.1 lbs; Left: 58.2 lbs 05/05/2024: 58.2 lbs Goal status:  IN PROGRESS  4.  Pt will demonstrate improved L coordination by a score of at least 39 blocks on Box and Blocks test Baseline: 29 blocks 05/05/2024: 36 blocks Goal status:  IN PROGRESS   ASSESSMENT:  CLINICAL IMPRESSION: Pt seen for OT today secondary to impaired functional use of LUE following chronic CVA. Participating well with desensitization and coordination/hand strength. Remaining OT visits to focus on L hand strength and coordination to improve independence and safety with ADL and IADL completion.  PERFORMANCE DEFICITS: in functional skills including IADLs, coordination, dexterity, sensation, strength, Fine motor control, Gross motor control, decreased knowledge of precautions, and UE functional use, cognitive skills including safety awareness, and psychosocial skills including coping strategies and environmental  adaptation.   IMPAIRMENTS: are limiting patient from IADLs, work, and leisure.   CO-MORBIDITIES: may have co-morbidities  that affects occupational performance. Patient will benefit from skilled OT to address above impairments and improve overall function  REHAB POTENTIAL: Good  PLAN:  OT FREQUENCY: 2x/week  OT DURATION: 8 weeks  PLANNED INTERVENTIONS: 97168 OT Re-evaluation, 97535 self care/ADL training, 02889 therapeutic exercise, 97530 therapeutic activity, 97112 neuromuscular re-education, passive range of motion, psychosocial skills training, coping strategies training, patient/family education, and DME and/or AE instructions  RECOMMENDED OTHER SERVICES: none at this time  CONSULTED AND AGREED WITH PLAN OF  CARE: Patient  PLAN FOR NEXT SESSION:  Strengthening activities L shoulder and hand  Activities reducing sensitivity in L fingertips   Rocky Dutch, OT 05/19/2024, 2:57 PM

## 2024-05-23 ENCOUNTER — Ambulatory Visit

## 2024-05-23 ENCOUNTER — Ambulatory Visit: Admitting: Physical Therapy

## 2024-05-23 ENCOUNTER — Encounter: Payer: Self-pay | Admitting: Physical Therapy

## 2024-05-23 DIAGNOSIS — I69354 Hemiplegia and hemiparesis following cerebral infarction affecting left non-dominant side: Secondary | ICD-10-CM

## 2024-05-23 DIAGNOSIS — R278 Other lack of coordination: Secondary | ICD-10-CM

## 2024-05-23 DIAGNOSIS — R208 Other disturbances of skin sensation: Secondary | ICD-10-CM

## 2024-05-23 DIAGNOSIS — R29898 Other symptoms and signs involving the musculoskeletal system: Secondary | ICD-10-CM

## 2024-05-23 DIAGNOSIS — M6281 Muscle weakness (generalized): Secondary | ICD-10-CM

## 2024-05-23 DIAGNOSIS — R29818 Other symptoms and signs involving the nervous system: Secondary | ICD-10-CM

## 2024-05-23 DIAGNOSIS — R209 Unspecified disturbances of skin sensation: Secondary | ICD-10-CM

## 2024-05-23 DIAGNOSIS — R2681 Unsteadiness on feet: Secondary | ICD-10-CM

## 2024-05-23 NOTE — Therapy (Signed)
 " OUTPATIENT OCCUPATIONAL THERAPY NEURO TREATMENT  Patient Name: Donald [name] MRN: [id number] DOB:[birth year], 78 y.o., male Today's Date: 05/23/2024  PCP: Kip Righter, MD REFERRING PROVIDER: Sherryl Bouchard, NP  END OF SESSION:  OT End of Session - 05/23/24 1541     Visit Number 14    Number of Visits 17    Date for Recertification  06/04/24    Authorization Type MCR A&B/AARP    OT Start Time 1445    OT Stop Time 1530    OT Time Calculation (min) 45 min    Equipment Utilized During Treatment blue putty, web exerciser, tan and yellow wrist flexbars    Activity Tolerance Patient tolerated treatment well    Behavior During Therapy WFL for tasks assessed/performed             Past Medical History:  Diagnosis Date   Diabetes mellitus without complication (HCC)    Dysrhythmia    Hyperlipemia    Hypertension    large right middle cerebral artery infarct, 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 s/p IV tPA, b. source unknown, c. loop recorder placed   Snoring 07/03/2015   Past Surgical History:  Procedure Laterality Date   LOOP RECORDER IMPLANT  06/07/2013   MDT LinQ implanted by Dr Kelsie for cryptogenic stroke   LOOP RECORDER IMPLANT N/A 06/07/2013   Procedure: LOOP RECORDER IMPLANT;  Surgeon: Lynwood JONETTA Kelsie, MD;  Location: [facility] CATH [facility];  Service: Cardiovascular;  Laterality: N/A;   TEE WITHOUT CARDIOVERSION N/A 06/07/2013   Procedure: TRANSESOPHAGEAL ECHOCARDIOGRAM (TEE);  Surgeon: Leim VEAR Moose, MD;  Location: [facility] ENDOSCOPY;  Service: Cardiovascular;  Laterality: N/A;   TONSILLECTOMY     TRANSANAL HEMORRHOIDAL DEARTERIALIZATION N/A 02/18/2023   Procedure: TRANSANAL HEMORRHOIDAL DEARTERIALIZATION;  Surgeon: Debby Hila, MD;  Location: [facility] ORS;  Service: General;  Laterality: N/A;   UMBILICAL HERNIA REPAIR     Patient Active Problem List   Diagnosis Date Noted   Permanent atrial fibrillation (HCC) 05/17/2019   Acquired thrombophilia 05/17/2019   Snoring 07/03/2015    Persistent atrial fibrillation (HCC) 04/16/2015   Hypokalemia 09/20/2014   HTN (hypertension) 09/19/2014   HLD (hyperlipidemia) 09/19/2014   Diabetes mellitus without complication (HCC) 09/19/2014   Jerking 09/19/2014   Alterations of sensations, late effect of cerebrovascular disease 11/07/2013   Disturbances of vision, late effect of cerebrovascular disease 11/07/2013   Sinus bradycardia 08/08/2013   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HCC) 07/26/2013   Left-sided neglect 07/26/2013   Small vessel disease 06/07/2013   Obesity, unspecified 06/07/2013   Cerebral infarction (HCC) 06/07/2013   large right middle cerebral artery infarct, 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 Pt reports he can tell a little bit of difference but not much.  Pt accompanied by: self  PERTINENT HISTORY: R MCA infarct 06/03/2013 S/p tPA, HTN, DM2, Afib, had 1 seizure in 2017   DIAGNOSTIC FINDINGS:  Brain MRI 09/20/2014 IMPRESSION: 1. No acute infarct. 2. Chronic right MCA territory infarct. 3. Subtle T2 hyperintensity in the left hippocampus and left temporal lobe cortex versus artifact.  Recent seizure activity is a consideration  PRECAUTIONS: Fall and Other: loop recorder, L sided weakness  WEIGHT BEARING RESTRICTIONS: No  PAIN:  Are you having pain? No  FALLS: Has patient fallen in last 6 months? Yes. Number of falls 4 in the last year  LIVING  ENVIRONMENT: Lives with: lives with their spouse Lives in: House/apartment 1 level Stairs: No Has following equipment at home: Single point cane, shower chair/bench with back, walk in shower  PLOF: Requires assistive device for independence  PATIENT GOALS: I'd like to be able to use my left hand more functionally. I want to improve my coordination and reaction time.  OBJECTIVE:  Note: Objective measures were completed at Evaluation unless otherwise noted.  HAND DOMINANCE: Right  ADLs: Overall ADLs: Independent  Equipment: Shower seat with back and Walk in shower  IADLs: Cooks and cleans bathrooms and kitchen Shopping:  Light housekeeping: Independent Meal Prep: Independent Community mobility: Wife completes  Medication management: Independent Landscape architect: Independent Handwriting: did not assess d/t nondominant L hand being affected  MOBILITY STATUS: ambulates with SPC  POSTURE COMMENTS:  rounded shoulders, flexed trunk , and weight shift right - mildly flexed trunk and right weight shift in standing and with ambulation, L shoulder more rounded than R w/ mild shoulder hike   ACTIVITY TOLERANCE: Activity tolerance: no changes  FUNCTIONAL OUTCOME MEASURES: PSFS: 05/05/2024:     UPPER EXTREMITY ROM:  WNL  UPPER EXTREMITY MMT:   4-/5 shoulder   HAND FUNCTION: Grip strength: Right: 73.1 lbs; Left: 58.2 lbs  COORDINATION: 9 Hole Peg test: Right: 26.32 sec; Left: 65.08 sec Box and Blocks:  Right 49 blocks, Left 29 blocks  SENSATION: Reports hypersensitivity in fingertips  EDEMA: Pt reports hx of edema but it is rare now.  MUSCLE TONE: LUE: Modifed Ashworth Scale 1 = Slight increase in muscle tone, manifested by a catch and release or by minimal resistance at the end of the range of motion when the affected part(s) is moved in flexion or extension  COGNITION: Overall cognitive status: Within functional limits for tasks assessed  VISION: Subjective  report: Pt reports having small cataracts but it is not affecting vision at this time Baseline vision: Wears glasses all the time Visual history: cataracts well kept  VISION ASSESSMENT: To be further assessed in functional context  PERCEPTION: WFL  PRAXIS: WFL  OBSERVATIONS: Impaired FM coordination, grip strength, strength in L hand and shoulder, hypersensitivity in fingertips of L hand                                                                                                                           TREATMENT:  - Therapeutic exercises completed for duration as noted below including:  Pt completed 1x15 wrist flexion, wrist extension, and radial/ulnar deviation with LUE with yellow flex bar then with tan flex bar. Pt then completed 2x15 gross grasp with web exerciser.  - Therapeutic activities completed for duration as noted below including: Pt  removed 5 items from blue theraputty to increase grip strength in L hand. Pt given blue putty to take home and instructed to complete previously issued putty exercises with blue putty for improved grip strength.  - Self-care/home management completed for duration as noted below including: Re-assessed grip strength and Box and Blocks, noted decrease in both since last measurement, informed pt of this. Pt also completed desensitization with purple spiky ball for improved ability to play guitar with decreased sensitivity in hand.    PATIENT EDUCATION: Education details:  Person educated: Patient Education method: Explanation, Facilities Manager, Actor cues, and Verbal cues Education comprehension: verbalized understanding, returned demonstration, verbal cues required, and tactile cues required  HOME EXERCISE PROGRAM: 04/04/24: Desensitization 04/07/24: Putty, coordination 04/14/24: Golf solitaire 11.17.25: Access Code: [email or web address] URL: [email or web address] Date: 04/18/2024 Prepared by: Rocky Dutch  Exercises - Standing  Shoulder Horizontal Abduction with Resistance  - 1 x daily - 5 x weekly - 2 sets - 15 reps - Standing Shoulder Abduction with Self-Anchored Resistance  - 1 x daily - 5 x weekly - 2 sets - 15 reps - Standing Shoulder Flexion with Self-Anchored Resistance  - 1 x daily - 5 x weekly - 2 sets - 15 reps - Seated Shoulder Extension with Resistance  - 1 x daily - 5 x weekly - 2 sets - 10 reps   GOALS: Goals reviewed with patient? Yes  SHORT TERM GOALS: Target date: 05/04/24  Pt will be independent with HEP completion for coordination and strengthening Baseline: New to OP OT Goal status: MET  2.  Pt will be independent with desensitization techniques Baseline: Educated  Goal status: MET  3.  Pt will increase L shoulder strength to 4/5 Baseline: 4-/5  Goal status:  MET  4.  Pt will demonstrate improved coordination in L hand by a score of 34 blocks in Box and Blocks Test Baseline: Right 49 blocks, Left 29 blocks 05/05/2024: 36 blocks Goal status:  MET  LONG TERM GOALS: Target date: 06/04/24  Pt will demonstrate an increase of at least 2.0 score of average PSFS score or a 3 point increase in individual activity score in PSFS Baseline: 1.0 05/05/2024: 3.7 total score (see above for individual activity scores) Goal status:  MET  2.  Pt will demonstrate improved L hand FM coordination by a score of 60 seconds or less in 9HPT Baseline: Right: 26.32 sec; Left: 65.08 sec 05/05/2024: 59 seconds Goal status:  MET  3.  Pt will demonstrate improved grip strength in L hand by scoring at least 65 pounds Baseline: Right: 73.1 lbs; Left: 58.2 lbs 05/05/2024: 58.2 lbs 05/23/24: 48.4 lbs  Goal status:  IN PROGRESS  4.  Pt will demonstrate improved L coordination by a score of at least 39 blocks on Box and Blocks test Baseline: 29 blocks 05/05/2024: 36 blocks 05/23/24: 33 blocks Goal status:  IN PROGRESS   ASSESSMENT:  CLINICAL IMPRESSION: Pt seen for OT today secondary to impaired functional use  of LUE following chronic CVA. Participating well with desensitization and coordination/hand strength. Decrease noted in grip strength and coordination, graded up to blue putty to work grip strength further. Remaining OT visits to focus on L hand strength and coordination to improve independence and safety with ADL and IADL completion.  PERFORMANCE DEFICITS: in functional skills including IADLs, coordination, dexterity, sensation, strength, Fine motor control, Gross motor control, decreased knowledge of precautions, and UE functional use, cognitive skills including safety awareness, and psychosocial skills including coping strategies and environmental adaptation.  IMPAIRMENTS: are limiting patient from IADLs, work, and leisure.   CO-MORBIDITIES: may have co-morbidities  that affects occupational performance. Patient will benefit from skilled OT to address above impairments and improve overall function  REHAB POTENTIAL: Good  PLAN:  OT FREQUENCY: 2x/week  OT DURATION: 8 weeks  PLANNED INTERVENTIONS: 97168 OT Re-evaluation, 97535 self care/ADL training, 02889 therapeutic exercise, 97530 therapeutic activity, 97112 neuromuscular re-education, passive range of motion, psychosocial skills training, coping strategies training, patient/family education, and DME and/or AE instructions  RECOMMENDED OTHER SERVICES: none at this time  CONSULTED AND AGREED WITH PLAN OF CARE: Patient  PLAN FOR NEXT SESSION:  Strengthening activities L shoulder and hand  Activities reducing sensitivity in L fingertips   Rocky Dutch, OT 05/23/2024, 3:48 PM           "

## 2024-05-23 NOTE — Therapy (Signed)
 " OUTPATIENT PHYSICAL THERAPY NEURO TREATMENT   Patient Name: Donald [name] MRN: [id number] DOB:1945/06/28, 78 y.o., male Today's Date: 05/23/2024   PCP: Kip Righter, MD REFERRING PROVIDER: Sherryl Bouchard, NP  END OF SESSION:  PT End of Session - 05/23/24 1523     Visit Number 20    Number of Visits 25   17+8   Date for Recertification  06/10/24   pushed out due to scheduling delay (holidays)   Authorization Type Medicare A&B w/ [employer] supplement    Progress Note Due on Visit 30    PT Start Time 1531    PT Stop Time 1611    PT Time Calculation (min) 40 min    Equipment Utilized During Treatment Gait belt    Activity Tolerance Patient tolerated treatment well    Behavior During Therapy WFL for tasks assessed/performed          Past Medical History:  Diagnosis Date   Diabetes mellitus without complication (HCC)    Dysrhythmia    Hyperlipemia    Hypertension    large right middle cerebral artery infarct, 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 s/p IV tPA, b. source unknown, c. loop recorder placed   Snoring 07/03/2015   Past Surgical History:  Procedure Laterality Date   LOOP RECORDER IMPLANT  06/07/2013   MDT LinQ implanted by Dr Kelsie for cryptogenic stroke   LOOP RECORDER IMPLANT N/A 06/07/2013   Procedure: LOOP RECORDER IMPLANT;  Surgeon: Lynwood JONETTA Kelsie, MD;  Location: [facility] CATH [facility];  Service: Cardiovascular;  Laterality: N/A;   TEE WITHOUT CARDIOVERSION N/A 06/07/2013   Procedure: TRANSESOPHAGEAL ECHOCARDIOGRAM (TEE);  Surgeon: Leim VEAR Moose, MD;  Location: [facility] ENDOSCOPY;  Service: Cardiovascular;  Laterality: N/A;   TONSILLECTOMY     TRANSANAL HEMORRHOIDAL DEARTERIALIZATION N/A 02/18/2023   Procedure: TRANSANAL HEMORRHOIDAL DEARTERIALIZATION;  Surgeon: Debby Hila, MD;  Location: [facility] ORS;  Service: General;  Laterality: N/A;   UMBILICAL HERNIA REPAIR     Patient Active Problem List   Diagnosis Date Noted   Permanent atrial fibrillation (HCC) 05/17/2019    Acquired thrombophilia 05/17/2019   Snoring 07/03/2015   Persistent atrial fibrillation (HCC) 04/16/2015   Hypokalemia 09/20/2014   HTN (hypertension) 09/19/2014   HLD (hyperlipidemia) 09/19/2014   Diabetes mellitus without complication (HCC) 09/19/2014   Jerking 09/19/2014   Alterations of sensations, late effect of cerebrovascular disease 11/07/2013   Disturbances of vision, late effect of cerebrovascular disease 11/07/2013   Sinus bradycardia 08/08/2013   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HCC) 07/26/2013   Left-sided neglect 07/26/2013   Small vessel disease 06/07/2013   Obesity, unspecified 06/07/2013   Cerebral infarction (HCC) 06/07/2013   large right middle cerebral artery infarct, 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   Hypertension 06/03/2013   Diabetes (HCC) 06/03/2013    ONSET DATE: 06/03/2013 (CVA)  REFERRING DIAG: P30.645 (ICD-10-CM) - Hemiparesis affecting left side as late effect of cerebrovascular accident (HCC)  THERAPY DIAG:  Muscle weakness (generalized)  Other lack of coordination  Other disturbances of skin sensation  Other symptoms and signs involving the nervous system  Other symptoms and signs involving the musculoskeletal system  Hemiplegia and hemiparesis following cerebral infarction affecting left non-dominant side (HCC)  Unsteadiness on feet  Unspecified disturbances of skin sensation  Rationale for Evaluation and Treatment: Rehabilitation  SUBJECTIVE:  SUBJECTIVE STATEMENT: Donald [name]  He split his left thumb nail and requests a band-aid to prevent snagging it.  He denies falls or acute status changes.  Pt accompanied by: self (Wife - waiting in car)  PERTINENT HISTORY: R MCA infarct 06/03/2013 S/p tPA, HTN, DM2, Afib, had 1 seizure in 2017  PAIN:  Are you  having pain? No  PRECAUTIONS: Fall and Other: loop recorder  RED FLAGS: None   WEIGHT BEARING RESTRICTIONS: No  FALLS: Has patient fallen in last 6 months? Yes. Number of falls 3  LIVING ENVIRONMENT: Lives with: lives with their spouse Lives in: House/apartment Stairs: No Has following equipment at home: Single point cane  PLOF: Requires assistive device for independence  PATIENT GOALS: To work on reaction time and getting off the floor  OBJECTIVE:  Note: Objective measures were completed at Evaluation unless otherwise noted.  DIAGNOSTIC FINDINGS:  Brain MRI 09/20/2014 IMPRESSION: 1. No acute infarct. 2. Chronic right MCA territory infarct. 3. Subtle T2 hyperintensity in the left hippocampus and left temporal lobe cortex versus artifact. Recent seizure activity is a consideration.  COGNITION: Overall cognitive status: History of cognitive impairments - at baseline - reports quicker to anger/verbalize emotions/sometimes overstimulated with sounds and distractions   SENSATION: Light touch: WFL - LLE hypersensitive/question of allodynia  COORDINATION: Mildly dysmetric LLE  EDEMA:  None significant in BLE  MUSCLE TONE: LLE: Modifed Ashworth Scale 1+ = Slight increase in muscle tone, manifested by a catch, followed by minimal resistance throughout the remainder (less than half) of the ROM  POSTURE: rounded shoulders, flexed trunk , and weight shift right - mildly flexed trunk and right weight shift in standing and with ambulation, L shoulder more rounded than R w/ mild shoulder hike  LOWER EXTREMITY ROM:     Active  Right Eval Left Eval  Hip flexion WNL WFL  Hip extension    Hip abduction    Hip adduction    Hip internal rotation    Hip external rotation    Knee flexion    Knee extension    Ankle dorsiflexion  2+  Ankle plantarflexion    Ankle inversion    Ankle eversion     (Blank rows = not tested)  LOWER EXTREMITY MMT:    MMT Right Eval Left Eval   Hip flexion    Hip extension    Hip abduction    Hip adduction    Hip internal rotation    Hip external rotation    Knee flexion    Knee extension    Ankle dorsiflexion    Ankle plantarflexion    Ankle inversion    Ankle eversion    (Blank rows = not tested)  BED MOBILITY:  Findings: Sit to supine Complete Independence Supine to sit Complete Independence Rolling to Right Complete Independence Rolling to Left Complete Independence - needs increased time and momentum for L and R Ind w/ features of tempurpedic style bed  TRANSFERS: Sit to stand: SBA  Assistive device utilized: Single point cane     Stand to sit: SBA  Assistive device utilized: Single point cane     Chair to chair: SBA  Assistive device utilized: Single point cane       RAMP:  Not tested  CURB:  Not tested  STAIRS: Not tested GAIT: Findings: Distance walked: various clinic distances and Comments: Pt has mild right reliance w/ SPC, no foot scuff/toe catch even without AFO, shortened stride and slightly diminished flexion pattern of LLE  FUNCTIONAL  TESTS:  5 times sit to stand: 18.89 sec w/ heavy right trunk shortening and RUE reliance w/ L toe down vs full weight bearing  PATIENT SURVEYS:  ABC scale: The Activities-Specific Balance Confidence (ABC) Scale TBA                                                                                                                              TREATMENT DATE: 05-23-24 -x6 wide STS progressing to no UE support -Wedge STS x10 progressing to no UE support, moderate R trunk lean into standing -Bean bag flick x100' w/ SPC SBA -4 RLE elevated for L SLS w/ L reach for squigz placement on mirror at midline x1, x5, x16 to fatigue, using RUE support and mirror feedback to improve posture (pronation) of L foot in stance  PATIENT EDUCATION: Education details:  Continue HEP. Person educated: Patient Education method: Medical Illustrator Education comprehension:  verbalized understanding and needs further education  HOME EXERCISE PROGRAM: Access Code: [facility] URL: [email or web address] Date: 03/21/2024 Prepared by: Daved Bull  Exercises - Seated Toe Raise  - 1-2 x daily - 4 x weekly - 2 sets - 10 reps - 2-3 seconds hold - Ankle Inversion Eversion Towel Slide  - 1-2 x daily - 4 x weekly - 2 sets - 10 reps - Seated Ankle Circles  - 1-2 x daily - 4 x weekly - 2 sets - 10 reps - Seated Heel Raise  - 1-2 x daily - 4 x weekly - 2 sets - 10 reps - 2-3 seconds hold - Seated Ankle Inversion Eversion PROM  - 1 x daily - 7 x weekly - 2 sets - 10 reps - Staggered Sit-to-Stand  - 1 x daily - 4-5 x weekly - 2-3 sets - 10 reps - Seated Hamstring Stretch  - 1 x daily - 4-5 x weekly - 1 sets - 3-4 reps - 45 seconds hold - Hooklying Single Knee to Chest Stretch with Towel  - 1 x daily - 4-5 x weekly - 1 sets - 3-4 reps - 45 seconds hold - Seated Toe Towel Scrunches  - 1 x daily - 4 x weekly - 1-2 sets - 10 reps - Seated Arch Lifts  - 1 x daily - 4 x weekly - 1-2 sets - 10 reps  Desensitization Techniques: -Light touch/pressure:  using fingertip pressure to slowly move up small segments of the affected body part until outside the area of pain and then back to where you started -Deep pressure:  using fingertips to squeeze in small segments starting outside the affected area, crossing over the affected area, and then to the other side of the affected area -Tapping:  fingertips tap with firm pressure over the affected area, can move around the affected area as well -Brushing/scratching:  use fingertips to brush/scratch over and around the affected area -Textures:  use soft and rough textured items like cotton balls vs wash cloths to brush or rub  with light into firm pressure over and around the affected area  Repeat these 3-4x per day.   GOALS: Goals reviewed with patient? Yes  SHORT TERM GOALS: Target date: 04/08/2024  Pt will be  independent and compliant with introductory strength and balance HEP in order to maintain functional progress and improve mobility. Baseline:  IND and compliant (11/26) Goal status: MET  2.  Pt will demonstrate adequate balance strategies to maintain upright with forward/overhead/and floor reaching. Baseline:  Very narrow limits of stability - several near falls related to this; ongoing (11/26) Goal status: IN PROGRESS  3.  Pt will be independent with desensitization strategies for LLE hypersensitivity management. Baseline: Provided - pt has handout and actively working on in OT/PT (11/26) Goal status: MET  LONG TERM GOALS: Target date: 05/06/2024  Pt will be independent and compliant with advanced and finalized strength and balance HEP in order to maintain functional progress and improve mobility. Baseline: To be established; pt feels he is doing moderately well with frequency of his HEP (12/4) Goal status: PARTIALLY MET  2.  Patient will improve ABC Scale score to >/=76% in order to demonstrate decreased risk for falls and increased confidence in balance. Baseline: 66.875% (10/14); 80% (12/4) Goal status: MET  3.  Patient will demonstrate floor recovery w/ no more than SBA in order to improve safety and home management. Baseline: Reports recent minA even w/ support surface; minA to flip from bottom to knees using support surface (12/4) Goal status: IN PROGRESS  4.  Pt will decrease 5xSTS to </=12 seconds w/ improved midline mechanics in order to demonstrate decreased risk for falls and improved functional bilateral LE strength and power. Baseline: 18.89 sec w/ heavy right trunk shortening and RUE reliance w/ L toe down vs full weight bearing; 13.97 sec w/ light BUE support and mildly R rear staggered stance (12/4) Goal status: PARTIALLY MET  GOALS (at 87/5 re-cert): Goals reviewed with patient? Yes SHORT TERM GOALS = LONG TERM GOALS: Target date: 06/03/2024  Pt will be independent  and compliant with advanced and finalized strength and balance HEP in order to maintain functional progress and improve mobility. Baseline: To be established; pt feels he is doing moderately well with frequency of his HEP (12/4) Goal status: ONGOING  2.  Patient will improve ABC Scale score to >/=90% in order to demonstrate decreased risk for falls and increased confidence in balance. Baseline: 66.875% (10/14); 80% (12/4) Goal status: INITIAL  3.  Patient will demonstrate floor recovery w/ no more than SBA in order to improve safety and home management. Baseline: Reports recent minA even w/ support surface; minA to flip from bottom to knees using support surface (12/4) Goal status: ONGOING  4.  Pt will decrease 5xSTS to </=12 seconds w/ improved midline mechanics in order to demonstrate decreased risk for falls and improved functional bilateral LE strength and power. Baseline: 18.89 sec w/ heavy right trunk shortening and RUE reliance w/ L toe down vs full weight bearing; 13.97 sec w/ light BUE support and mildly R rear staggered stance (12/4) Goal status: ONGOING  ASSESSMENT:  CLINICAL IMPRESSION: Emphasis of skilled PT session today on improving left foot positioning and progressing L weight bearing tolerance.  He is able to progress time spent in L dominant stance with repetition of task.  He would further benefit from tasks that require left weight shifting and increased stance time to improve gait mechanics and ability to maintain safe ankle positioning.  Continue per POC.  OBJECTIVE  IMPAIRMENTS: Abnormal gait, decreased activity tolerance, decreased balance, decreased cognition, decreased coordination, decreased endurance, decreased knowledge of use of DME, decreased strength, impaired sensation, impaired tone, improper body mechanics, and postural dysfunction.   ACTIVITY LIMITATIONS: carrying, lifting, bending, squatting, stairs, transfers, reach over head, and locomotion  level  PARTICIPATION LIMITATIONS: laundry, shopping, and community activity  PERSONAL FACTORS: Age, Fitness, Past/current experiences, Time since onset of injury/illness/exacerbation, and 1-2 comorbidities: HTN, Afib are also affecting patient's functional outcome.   REHAB POTENTIAL: Good  CLINICAL DECISION MAKING: Stable/uncomplicated  EVALUATION COMPLEXITY: Moderate  PLAN:  PT FREQUENCY: 2x/week + 2x/wk  PT DURATION: 8 weeks + 4 wks  PLANNED INTERVENTIONS: [id number]- PT Re-evaluation, 97750- Physical Performance Testing, 97110-Therapeutic exercises, 97530- Therapeutic activity, [id number]- Neuromuscular re-education, 97535- Self Care, [id number]- Manual therapy, [id number]- Gait training, [phone]- Orthotic Initial, [phone]- Orthotic/Prosthetic subsequent, [phone]- Aquatic Therapy, [phone]- Electrical stimulation (manual), Patient/Family education, Balance training, Stair training, Taping, Joint mobilization, Vestibular training, and DME instructions  PLAN FOR NEXT SESSION: Address orthotic if pt requests!  Floor recovery - incorporate tight space setup - repeat w/ full transition to bottom and incorporate LLE use to recover to standing - pt wants to work towards no UE support if possible (did discuss limitations and safety recs 11/20).  LLE NMR/strength/reaction time.  Expand strength and balance HEP.  Core strength - work mat table level on tasks to improve floor recovery from stomach and supine.  Reaching floor/forward/overhead. Perturbations/resisted gait/farmer's carry. Try ladder? Glut strengthening.  Pt felt challenged w/: 4 elevated RLE for L SLS for mirror squigz Wide STS Offset squats/mini lunges Hamstring pushbacks - strap to prevent squatting Wedge STS progress to no UE support Stairs/step taps/up; incline step through Standing 10lb kettlebell hip flexion LLE (isometric DF) Walking beanbag DF SLS taps to top step on L leg > L step ups Prolonged L stance 6 step w/ PT facilitating ankle  posture Decline RLE step through working into lengthened stride 10 minutes on core at end of session - standing?   Pt declines offer of aquatic therapy at evaluation.  Daved KATHEE Bull, PT, DPT 05/23/2024, 4:41 PM        "

## 2024-05-25 ENCOUNTER — Ambulatory Visit: Admitting: Occupational Therapy

## 2024-05-30 ENCOUNTER — Encounter: Payer: Self-pay | Admitting: Physical Therapy

## 2024-05-30 ENCOUNTER — Ambulatory Visit

## 2024-05-30 ENCOUNTER — Ambulatory Visit: Admitting: Physical Therapy

## 2024-05-30 DIAGNOSIS — R29818 Other symptoms and signs involving the nervous system: Secondary | ICD-10-CM

## 2024-05-30 DIAGNOSIS — I69354 Hemiplegia and hemiparesis following cerebral infarction affecting left non-dominant side: Secondary | ICD-10-CM

## 2024-05-30 DIAGNOSIS — R209 Unspecified disturbances of skin sensation: Secondary | ICD-10-CM

## 2024-05-30 DIAGNOSIS — R29898 Other symptoms and signs involving the musculoskeletal system: Secondary | ICD-10-CM

## 2024-05-30 DIAGNOSIS — M6281 Muscle weakness (generalized): Secondary | ICD-10-CM | POA: Diagnosis not present

## 2024-05-30 DIAGNOSIS — R208 Other disturbances of skin sensation: Secondary | ICD-10-CM

## 2024-05-30 DIAGNOSIS — R278 Other lack of coordination: Secondary | ICD-10-CM

## 2024-05-30 DIAGNOSIS — R2681 Unsteadiness on feet: Secondary | ICD-10-CM

## 2024-05-30 NOTE — Therapy (Signed)
 " OUTPATIENT OCCUPATIONAL THERAPY NEURO TREATMENT  Patient Name: Donald [name] MRN: [id number] DOB:03-14-1946, 78 y.o., male Today's Date: 05/30/2024  PCP: Kip Righter, MD REFERRING PROVIDER: Sherryl Bouchard, NP  END OF SESSION:  OT End of Session - 05/30/24 1403     Visit Number 15    Number of Visits 17    Date for Recertification  06/04/24    Authorization Type MCR A&B/[employer]    OT Start Time 1402    OT Stop Time 1445    OT Time Calculation (min) 43 min    Equipment Utilized During Treatment blue putty, web exerciser, tan and yellow wrist flexbars, velcro board    Activity Tolerance Patient tolerated treatment well    Behavior During Therapy WFL for tasks assessed/performed             Past Medical History:  Diagnosis Date   Diabetes mellitus without complication (HCC)    Dysrhythmia    Hyperlipemia    Hypertension    large right middle cerebral artery infarct, 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 s/p IV tPA, b. source unknown, c. loop recorder placed   Snoring 07/03/2015   Past Surgical History:  Procedure Laterality Date   LOOP RECORDER IMPLANT  06/07/2013   MDT LinQ implanted by Dr Kelsie for cryptogenic stroke   LOOP RECORDER IMPLANT N/A 06/07/2013   Procedure: LOOP RECORDER IMPLANT;  Surgeon: Lynwood JONETTA Kelsie, MD;  Location: [facility] CATH LAB;  Service: Cardiovascular;  Laterality: N/A;   TEE WITHOUT CARDIOVERSION N/A 06/07/2013   Procedure: TRANSESOPHAGEAL ECHOCARDIOGRAM (TEE);  Surgeon: Leim VEAR Moose, MD;  Location: [facility] ENDOSCOPY;  Service: Cardiovascular;  Laterality: N/A;   TONSILLECTOMY     TRANSANAL HEMORRHOIDAL DEARTERIALIZATION N/A 02/18/2023   Procedure: TRANSANAL HEMORRHOIDAL DEARTERIALIZATION;  Surgeon: Debby Hila, MD;  Location: [facility] ORS;  Service: General;  Laterality: N/A;   UMBILICAL HERNIA REPAIR     Patient Active Problem List   Diagnosis Date Noted   Permanent atrial fibrillation (HCC) 05/17/2019   Acquired thrombophilia 05/17/2019   Snoring  07/03/2015   Persistent atrial fibrillation (HCC) 04/16/2015   Hypokalemia 09/20/2014   HTN (hypertension) 09/19/2014   HLD (hyperlipidemia) 09/19/2014   Diabetes mellitus without complication (HCC) 09/19/2014   Jerking 09/19/2014   Alterations of sensations, late effect of cerebrovascular disease 11/07/2013   Disturbances of vision, late effect of cerebrovascular disease 11/07/2013   Sinus bradycardia 08/08/2013   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HCC) 07/26/2013   Left-sided neglect 07/26/2013   Small vessel disease 06/07/2013   Obesity, unspecified 06/07/2013   Cerebral infarction (HCC) 06/07/2013   large right middle cerebral artery infarct, 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   Hypertension 06/03/2013   Diabetes (HCC) 06/03/2013   ONSET DATE: 06/03/2013 (CVA), 03/15/2024 referral date  REFERRING DIAG: P30.645 (ICD-10-CM) - Hemiplegia and hemiparesis following cerebral infarction affecting left non-dominant side (HCC)  THERAPY DIAG:  Muscle weakness (generalized)  Other lack of coordination  Other disturbances of skin sensation  Hemiplegia and hemiparesis following cerebral infarction affecting left non-dominant side (HCC)  Rationale for Evaluation and Treatment: Rehabilitation  SUBJECTIVE:   SUBJECTIVE STATEMENT: Pt reports his schedule has been a little different because of the holidays.   Pt accompanied by: self  PERTINENT HISTORY: R MCA infarct 06/03/2013 S/p tPA, HTN, DM2, Afib, had 1 seizure in 2017   DIAGNOSTIC FINDINGS:  Brain MRI 09/20/2014 IMPRESSION: 1. No acute infarct. 2. Chronic right MCA territory infarct. 3. Subtle T2 hyperintensity in the left hippocampus and left temporal lobe cortex versus artifact. Recent  seizure activity is a consideration  PRECAUTIONS: Fall and Other: loop recorder, L sided weakness  WEIGHT BEARING RESTRICTIONS: No  PAIN:  Are you having pain? No  FALLS: Has patient fallen in last 6 months? Yes. Number of falls 4 in the last  year  LIVING ENVIRONMENT: Lives with: lives with their spouse Lives in: House/apartment 1 level Stairs: No Has following equipment at home: Single point cane, shower chair/bench with back, walk in shower  PLOF: Requires assistive device for independence  PATIENT GOALS: I'd like to be able to use my left hand more functionally. I want to improve my coordination and reaction time.  OBJECTIVE:  Note: Objective measures were completed at Evaluation unless otherwise noted.  HAND DOMINANCE: Right  ADLs: Overall ADLs: Independent  Equipment: Shower seat with back and Walk in shower  IADLs: Cooks and cleans bathrooms and kitchen Shopping:  Light housekeeping: Independent Meal Prep: Independent Community mobility: Wife completes  Medication management: Independent Landscape architect: Independent Handwriting: did not assess d/t nondominant L hand being affected  MOBILITY STATUS: ambulates with SPC  POSTURE COMMENTS:  rounded shoulders, flexed trunk , and weight shift right - mildly flexed trunk and right weight shift in standing and with ambulation, L shoulder more rounded than R w/ mild shoulder hike   ACTIVITY TOLERANCE: Activity tolerance: no changes  FUNCTIONAL OUTCOME MEASURES: PSFS: 05/05/2024:     UPPER EXTREMITY ROM:  WNL  UPPER EXTREMITY MMT:   4-/5 shoulder   HAND FUNCTION: Grip strength: Right: 73.1 lbs; Left: 58.2 lbs  COORDINATION: 9 Hole Peg test: Right: 26.32 sec; Left: 65.08 sec Box and Blocks:  Right 49 blocks, Left 29 blocks  SENSATION: Reports hypersensitivity in fingertips  EDEMA: Pt reports hx of edema but it is rare now.  MUSCLE TONE: LUE: Modifed Ashworth Scale 1 = Slight increase in muscle tone, manifested by a catch and release or by minimal resistance at the end of the range of motion when the affected part(s) is moved in flexion or extension  COGNITION: Overall cognitive status: Within functional limits for tasks  assessed  VISION: Subjective report: Pt reports having small cataracts but it is not affecting vision at this time Baseline vision: Wears glasses all the time Visual history: cataracts well kept  VISION ASSESSMENT: To be further assessed in functional context  PERCEPTION: WFL  PRAXIS: WFL  OBSERVATIONS: Impaired FM coordination, grip strength, strength in L hand and shoulder, hypersensitivity in fingertips of L hand                                                                                                                           TREATMENT:   - Therapeutic activities completed for duration as noted below including: Patient used L hand with 3 lb wrist weight for added resistance to assemble small Jenga tower, and then removed approximately 12 pieces individually until the tower fell over for fine motor coordination of affected extremity.   With clamp and silver coil  at level 3 resistance picked up blocks to improve L grip strength, graded down to level 2. With use of red and blue side of Power Web, patient completed L composite flexion for ROM and strengthening x 20.  With blue theraputty, removed 5 items for improving L grip strength and pinch strength.  With velcro board completed approximately 15 reps with L hand resisted wrist flexion, extension, pronation and supination.  - Therapeutic exercises completed for duration as noted below including: Pt engaged in resistance bar exercises with yellow bar, then red flex bar x 20 reps each for strength and endurance of affected extremity and printed Med[employer] HEP provided in pt instructions for the following: -wrist flex and extension - twisting bar forward and backwards to flex and extend wrist/s -supination/pronation - bending the bar on table top side to side to rotate forearm - palm up and down     Informed pt of upcoming final visit and possibility of d/c, pt verbalized understanding. PATIENT EDUCATION: Education details:   Person educated: Patient Education method: Explanation, Facilities Manager, Actor cues, and Verbal cues Education comprehension: verbalized understanding, returned demonstration, verbal cues required, and tactile cues required  HOME EXERCISE PROGRAM: 04/04/24: Desensitization 04/07/24: Putty, coordination 04/14/24: Golf solitaire 11.17.25: Access Code: [email or web address] URL: [email or web address] Date: 04/18/2024 Prepared by: Rocky Dutch  Exercises - Standing Shoulder Horizontal Abduction with Resistance  - 1 x daily - 5 x weekly - 2 sets - 15 reps - Standing Shoulder Abduction with Self-Anchored Resistance  - 1 x daily - 5 x weekly - 2 sets - 15 reps - Standing Shoulder Flexion with Self-Anchored Resistance  - 1 x daily - 5 x weekly - 2 sets - 15 reps - Seated Shoulder Extension with Resistance  - 1 x daily - 5 x weekly - 2 sets - 10 reps   GOALS: Goals reviewed with patient? Yes  SHORT TERM GOALS: Target date: 05/04/24  Pt will be independent with HEP completion for coordination and strengthening Baseline: New to OP OT Goal status: MET  2.  Pt will be independent with desensitization techniques Baseline: Educated  Goal status: MET  3.  Pt will increase L shoulder strength to 4/5 Baseline: 4-/5  Goal status:  MET  4.  Pt will demonstrate improved coordination in L hand by a score of 34 blocks in Box and Blocks Test Baseline: Right 49 blocks, Left 29 blocks 05/05/2024: 36 blocks Goal status:  MET  LONG TERM GOALS: Target date: 06/04/24  Pt will demonstrate an increase of at least 2.0 score of average PSFS score or a 3 point increase in individual activity score in PSFS Baseline: 1.0 05/05/2024: 3.7 total score (see above for individual activity scores) Goal status:  MET  2.  Pt will demonstrate improved L hand FM coordination by a score of 60 seconds or less in 9HPT Baseline: Right: 26.32 sec; Left: 65.08 sec 05/05/2024: 59 seconds Goal status:  MET  3.  Pt will  demonstrate improved grip strength in L hand by scoring at least 65 pounds Baseline: Right: 73.1 lbs; Left: 58.2 lbs 05/05/2024: 58.2 lbs 05/23/24: 48.4 lbs  Goal status:  IN PROGRESS  4.  Pt will demonstrate improved L coordination by a score of at least 39 blocks on Box and Blocks test Baseline: 29 blocks 05/05/2024: 36 blocks 05/23/24: 33 blocks Goal status:  IN PROGRESS   ASSESSMENT:  CLINICAL IMPRESSION: Pt seen for OT today secondary to impaired functional use of LUE following chronic CVA. Participating well with desensitization  and coordination/hand strength. Decrease noted in grip strength and coordination, graded up to blue putty to work grip strength further. Remaining OT visits to obtain new measurements and complete d/c or re-cert as appropriate.  PERFORMANCE DEFICITS: in functional skills including IADLs, coordination, dexterity, sensation, strength, Fine motor control, Gross motor control, decreased knowledge of precautions, and UE functional use, cognitive skills including safety awareness, and psychosocial skills including coping strategies and environmental adaptation.   IMPAIRMENTS: are limiting patient from IADLs, work, and leisure.   CO-MORBIDITIES: may have co-morbidities  that affects occupational performance. Patient will benefit from skilled OT to address above impairments and improve overall function  REHAB POTENTIAL: Good  PLAN:  OT FREQUENCY: 2x/week  OT DURATION: 8 weeks  PLANNED INTERVENTIONS: 97168 OT Re-evaluation, 97535 self care/ADL training, 02889 therapeutic exercise, 97530 therapeutic activity, 97112 neuromuscular re-education, passive range of motion, psychosocial skills training, coping strategies training, patient/family education, and DME and/or AE instructions  RECOMMENDED OTHER SERVICES: none at this time  CONSULTED AND AGREED WITH PLAN OF CARE: Patient  PLAN FOR NEXT SESSION: D/c or re-cert, assess STG/LTG Strengthening activities L  shoulder and hand  Activities reducing sensitivity in L fingertips   Rocky Dutch, OT 05/30/2024, 4:41 PM           "

## 2024-05-30 NOTE — Therapy (Signed)
 " OUTPATIENT PHYSICAL THERAPY NEURO TREATMENT   Patient Name: Donald [name] MRN: [id number] DOB:1945/10/26, 78 y.o., male Today's Date: 05/30/2024   PCP: Kip Righter, MD REFERRING PROVIDER: Sherryl Bouchard, NP  END OF SESSION:  PT End of Session - 05/30/24 1447     Visit Number 21    Number of Visits 25   17+8   Date for Recertification  06/10/24   pushed out due to scheduling delay (holidays)   Authorization Type Medicare A&B w/ [employer] supplement    Progress Note Due on Visit 30    PT Start Time 1446    PT Stop Time 1530    PT Time Calculation (min) 44 min    Equipment Utilized During Treatment Gait belt    Activity Tolerance Patient tolerated treatment well    Behavior During Therapy WFL for tasks assessed/performed          Past Medical History:  Diagnosis Date   Diabetes mellitus without complication (HCC)    Dysrhythmia    Hyperlipemia    Hypertension    large right middle cerebral artery infarct, 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 s/p IV tPA, b. source unknown, c. loop recorder placed   Snoring 07/03/2015   Past Surgical History:  Procedure Laterality Date   LOOP RECORDER IMPLANT  06/07/2013   MDT LinQ implanted by Dr Kelsie for cryptogenic stroke   LOOP RECORDER IMPLANT N/A 06/07/2013   Procedure: LOOP RECORDER IMPLANT;  Surgeon: Lynwood JONETTA Kelsie, MD;  Location: [facility] CATH LAB;  Service: Cardiovascular;  Laterality: N/A;   TEE WITHOUT CARDIOVERSION N/A 06/07/2013   Procedure: TRANSESOPHAGEAL ECHOCARDIOGRAM (TEE);  Surgeon: Leim VEAR Moose, MD;  Location: [facility] ENDOSCOPY;  Service: Cardiovascular;  Laterality: N/A;   TONSILLECTOMY     TRANSANAL HEMORRHOIDAL DEARTERIALIZATION N/A 02/18/2023   Procedure: TRANSANAL HEMORRHOIDAL DEARTERIALIZATION;  Surgeon: Debby Hila, MD;  Location: [facility] ORS;  Service: General;  Laterality: N/A;   UMBILICAL HERNIA REPAIR     Patient Active Problem List   Diagnosis Date Noted   Permanent atrial fibrillation (HCC) 05/17/2019    Acquired thrombophilia 05/17/2019   Snoring 07/03/2015   Persistent atrial fibrillation (HCC) 04/16/2015   Hypokalemia 09/20/2014   HTN (hypertension) 09/19/2014   HLD (hyperlipidemia) 09/19/2014   Diabetes mellitus without complication (HCC) 09/19/2014   Jerking 09/19/2014   Alterations of sensations, late effect of cerebrovascular disease 11/07/2013   Disturbances of vision, late effect of cerebrovascular disease 11/07/2013   Sinus bradycardia 08/08/2013   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HCC) 07/26/2013   Left-sided neglect 07/26/2013   Small vessel disease 06/07/2013   Obesity, unspecified 06/07/2013   Cerebral infarction (HCC) 06/07/2013   large right middle cerebral artery infarct, 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   Hypertension 06/03/2013   Diabetes (HCC) 06/03/2013    ONSET DATE: 06/03/2013 (CVA)  REFERRING DIAG: P30.645 (ICD-10-CM) - Hemiparesis affecting left side as late effect of cerebrovascular accident (HCC)  THERAPY DIAG:  Muscle weakness (generalized)  Other lack of coordination  Other disturbances of skin sensation  Hemiplegia and hemiparesis following cerebral infarction affecting left non-dominant side (HCC)  Other symptoms and signs involving the nervous system  Other symptoms and signs involving the musculoskeletal system  Unsteadiness on feet  Unspecified disturbances of skin sensation  Rationale for Evaluation and Treatment: Rehabilitation  SUBJECTIVE:  SUBJECTIVE STATEMENT: Donald [name]  His energy levels are a bit lower though he otherwise feels fine.  He attributes this to the holidays.  He denies falls or acute status changes.  Pt accompanied by: self (Wife - waiting in car)  PERTINENT HISTORY: R MCA infarct 06/03/2013 S/p tPA, HTN, DM2, Afib, had 1 seizure in  2017  PAIN:  Are you having pain? No  PRECAUTIONS: Fall and Other: loop recorder  RED FLAGS: None   WEIGHT BEARING RESTRICTIONS: No  FALLS: Has patient fallen in last 6 months? Yes. Number of falls 3  LIVING ENVIRONMENT: Lives with: lives with their spouse Lives in: House/apartment Stairs: No Has following equipment at home: Single point cane  PLOF: Requires assistive device for independence  PATIENT GOALS: To work on reaction time and getting off the floor  OBJECTIVE:  Note: Objective measures were completed at Evaluation unless otherwise noted.  DIAGNOSTIC FINDINGS:  Brain MRI 09/20/2014 IMPRESSION: 1. No acute infarct. 2. Chronic right MCA territory infarct. 3. Subtle T2 hyperintensity in the left hippocampus and left temporal lobe cortex versus artifact. Recent seizure activity is a consideration.  COGNITION: Overall cognitive status: History of cognitive impairments - at baseline - reports quicker to anger/verbalize emotions/sometimes overstimulated with sounds and distractions   SENSATION: Light touch: WFL - LLE hypersensitive/question of allodynia  COORDINATION: Mildly dysmetric LLE  EDEMA:  None significant in BLE  MUSCLE TONE: LLE: Modifed Ashworth Scale 1+ = Slight increase in muscle tone, manifested by a catch, followed by minimal resistance throughout the remainder (less than half) of the ROM  POSTURE: rounded shoulders, flexed trunk , and weight shift right - mildly flexed trunk and right weight shift in standing and with ambulation, L shoulder more rounded than R w/ mild shoulder hike  LOWER EXTREMITY ROM:     Active  Right Eval Left Eval  Hip flexion WNL WFL  Hip extension    Hip abduction    Hip adduction    Hip internal rotation    Hip external rotation    Knee flexion    Knee extension    Ankle dorsiflexion  2+  Ankle plantarflexion    Ankle inversion    Ankle eversion     (Blank rows = not tested)  LOWER EXTREMITY MMT:    MMT  Right Eval Left Eval  Hip flexion    Hip extension    Hip abduction    Hip adduction    Hip internal rotation    Hip external rotation    Knee flexion    Knee extension    Ankle dorsiflexion    Ankle plantarflexion    Ankle inversion    Ankle eversion    (Blank rows = not tested)  BED MOBILITY:  Findings: Sit to supine Complete Independence Supine to sit Complete Independence Rolling to Right Complete Independence Rolling to Left Complete Independence - needs increased time and momentum for L and R Ind w/ features of tempurpedic style bed  TRANSFERS: Sit to stand: SBA  Assistive device utilized: Single point cane     Stand to sit: SBA  Assistive device utilized: Single point cane     Chair to chair: SBA  Assistive device utilized: Single point cane       RAMP:  Not tested  CURB:  Not tested  STAIRS: Not tested GAIT: Findings: Distance walked: various clinic distances and Comments: Pt has mild right reliance w/ SPC, no foot scuff/toe catch even without AFO, shortened stride and slightly diminished flexion  pattern of LLE  FUNCTIONAL TESTS:  5 times sit to stand: 18.89 sec w/ heavy right trunk shortening and RUE reliance w/ L toe down vs full weight bearing  PATIENT SURVEYS:  ABC scale: The Activities-Specific Balance Confidence (ABC) Scale TBA                                                                                                                              TREATMENT DATE: 05-30-24 -4 RLE elevated for L SLS w/ L reach for squigz placement on mirror at right edge of mirror x11, x11, using RUE support on initial round only and mirror feedback to improve posture (pronation) of L foot in stance, much improved symmetry of stance and progression to no UE support -Wide STS x8 -Ball stop and roll progressing to kicks RLE x12 > LLE x12 using BUE support on chair backs -Spanish squats x14 w/ large band and RUE support -Hamstring pushbacks using large resistance band  to prevent excessive anterior translation of knees -6lb short lever marching CGA x30 > 6lb long lever marching x20 w/ minA ball support on final reps -3lb bimanual contralateral punch w/ march regressed to contralateral step for increased stability and focus on coordination -6lb standing narrow stance trunk rotation x15 each side SBA, cues to inc ROM  PATIENT EDUCATION: Education details:  Continue HEP. Person educated: Patient Education method: Medical Illustrator Education comprehension: verbalized understanding and needs further education  HOME EXERCISE PROGRAM: Access Code: [facility] URL: [email or web address] Date: 03/21/2024 Prepared by: Daved Bull  Exercises - Seated Toe Raise  - 1-2 x daily - 4 x weekly - 2 sets - 10 reps - 2-3 seconds hold - Ankle Inversion Eversion Towel Slide  - 1-2 x daily - 4 x weekly - 2 sets - 10 reps - Seated Ankle Circles  - 1-2 x daily - 4 x weekly - 2 sets - 10 reps - Seated Heel Raise  - 1-2 x daily - 4 x weekly - 2 sets - 10 reps - 2-3 seconds hold - Seated Ankle Inversion Eversion PROM  - 1 x daily - 7 x weekly - 2 sets - 10 reps - Staggered Sit-to-Stand  - 1 x daily - 4-5 x weekly - 2-3 sets - 10 reps - Seated Hamstring Stretch  - 1 x daily - 4-5 x weekly - 1 sets - 3-4 reps - 45 seconds hold - Hooklying Single Knee to Chest Stretch with Towel  - 1 x daily - 4-5 x weekly - 1 sets - 3-4 reps - 45 seconds hold - Seated Toe Towel Scrunches  - 1 x daily - 4 x weekly - 1-2 sets - 10 reps - Seated Arch Lifts  - 1 x daily - 4 x weekly - 1-2 sets - 10 reps  Desensitization Techniques: -Light touch/pressure:  using fingertip pressure to slowly move up small segments of the affected body part until outside the area of pain and then  back to where you started -Deep pressure:  using fingertips to squeeze in small segments starting outside the affected area, crossing over the affected area, and then to the other side of the  affected area -Tapping:  fingertips tap with firm pressure over the affected area, can move around the affected area as well -Brushing/scratching:  use fingertips to brush/scratch over and around the affected area -Textures:  use soft and rough textured items like cotton balls vs wash cloths to brush or rub with light into firm pressure over and around the affected area  Repeat these 3-4x per day.   GOALS: Goals reviewed with patient? Yes  SHORT TERM GOALS: Target date: 04/08/2024  Pt will be independent and compliant with introductory strength and balance HEP in order to maintain functional progress and improve mobility. Baseline:  IND and compliant (11/26) Goal status: MET  2.  Pt will demonstrate adequate balance strategies to maintain upright with forward/overhead/and floor reaching. Baseline:  Very narrow limits of stability - several near falls related to this; ongoing (11/26) Goal status: IN PROGRESS  3.  Pt will be independent with desensitization strategies for LLE hypersensitivity management. Baseline: Provided - pt has handout and actively working on in OT/PT (11/26) Goal status: MET  LONG TERM GOALS: Target date: 05/06/2024  Pt will be independent and compliant with advanced and finalized strength and balance HEP in order to maintain functional progress and improve mobility. Baseline: To be established; pt feels he is doing moderately well with frequency of his HEP (12/4) Goal status: PARTIALLY MET  2.  Patient will improve ABC Scale score to >/=76% in order to demonstrate decreased risk for falls and increased confidence in balance. Baseline: 66.875% (10/14); 80% (12/4) Goal status: MET  3.  Patient will demonstrate floor recovery w/ no more than SBA in order to improve safety and home management. Baseline: Reports recent minA even w/ support surface; minA to flip from bottom to knees using support surface (12/4) Goal status: IN PROGRESS  4.  Pt will decrease 5xSTS  to </=12 seconds w/ improved midline mechanics in order to demonstrate decreased risk for falls and improved functional bilateral LE strength and power. Baseline: 18.89 sec w/ heavy right trunk shortening and RUE reliance w/ L toe down vs full weight bearing; 13.97 sec w/ light BUE support and mildly R rear staggered stance (12/4) Goal status: PARTIALLY MET  GOALS (at 87/5 re-cert): Goals reviewed with patient? Yes SHORT TERM GOALS = LONG TERM GOALS: Target date: 06/03/2024  Pt will be independent and compliant with advanced and finalized strength and balance HEP in order to maintain functional progress and improve mobility. Baseline: To be established; pt feels he is doing moderately well with frequency of his HEP (12/4) Goal status: ONGOING  2.  Patient will improve ABC Scale score to >/=90% in order to demonstrate decreased risk for falls and increased confidence in balance. Baseline: 66.875% (10/14); 80% (12/4) Goal status: INITIAL  3.  Patient will demonstrate floor recovery w/ no more than SBA in order to improve safety and home management. Baseline: Reports recent minA even w/ support surface; minA to flip from bottom to knees using support surface (12/4) Goal status: ONGOING  4.  Pt will decrease 5xSTS to </=12 seconds w/ improved midline mechanics in order to demonstrate decreased risk for falls and improved functional bilateral LE strength and power. Baseline: 18.89 sec w/ heavy right trunk shortening and RUE reliance w/ L toe down vs full weight bearing; 13.97 sec w/  light BUE support and mildly R rear staggered stance (12/4) Goal status: ONGOING  ASSESSMENT:  CLINICAL IMPRESSION: Focus of skilled PT today on continuing to improve SLS.  He demonstrates improved left LE weight acceptance w/ static tasks. PT was able to progress pt to limited marching tasks w/o UE support.  His general balance appears to be improving and he demonstrates improved stepping reactions with posterior LOB  today.  He stands to benefit from further PT to improved floor recovery, core strength needed for endurance and dynamic stability, and general L foot posturing to prevent potential injury.  Continue per POC.  OBJECTIVE IMPAIRMENTS: Abnormal gait, decreased activity tolerance, decreased balance, decreased cognition, decreased coordination, decreased endurance, decreased knowledge of use of DME, decreased strength, impaired sensation, impaired tone, improper body mechanics, and postural dysfunction.   ACTIVITY LIMITATIONS: carrying, lifting, bending, squatting, stairs, transfers, reach over head, and locomotion level  PARTICIPATION LIMITATIONS: laundry, shopping, and community activity  PERSONAL FACTORS: Age, Fitness, Past/current experiences, Time since onset of injury/illness/exacerbation, and 1-2 comorbidities: HTN, Afib are also affecting patient's functional outcome.   REHAB POTENTIAL: Good  CLINICAL DECISION MAKING: Stable/uncomplicated  EVALUATION COMPLEXITY: Moderate  PLAN:  PT FREQUENCY: 2x/week + 2x/wk  PT DURATION: 8 weeks + 4 wks  PLANNED INTERVENTIONS: 97164- PT Re-evaluation, 97750- Physical Performance Testing, 97110-Therapeutic exercises, 97530- Therapeutic activity, [id number]- Neuromuscular re-education, 97535- Self Care, [id number]- Manual therapy, [id number]- Gait training, [phone]- Orthotic Initial, [phone]- Orthotic/Prosthetic subsequent, [phone]- Aquatic Therapy, [phone]- Electrical stimulation (manual), Patient/Family education, Balance training, Stair training, Taping, Joint mobilization, Vestibular training, and DME instructions  PLAN FOR NEXT SESSION: Address orthotic if pt requests!  Sees [facility] on Jan 9th for possible AFO option.    Add SLS tasks and marching to HEP.  Floor recovery - incorporate tight space setup - repeat w/ full transition to bottom and incorporate LLE use to recover to standing - pt wants to work towards no UE support if possible (did discuss limitations and safety  recs 11/20).  LLE NMR/strength/reaction time.  Expand strength and balance HEP.  Core strength - work mat table level on tasks to improve floor recovery from stomach and supine.  Reaching floor/forward/overhead. Perturbations/resisted gait/farmer's carry. Try ladder? Glut strengthening.  Pt felt challenged w/: 4 elevated RLE for L SLS for mirror squigz Wide STS Offset squats/mini lunges Hamstring pushbacks - strap to prevent squatting Wedge STS progress to no UE support Stairs/step taps/up; incline step through Standing 10lb kettlebell hip flexion LLE (isometric DF) Walking beanbag DF SLS taps to top step on L leg > L step ups Prolonged L stance 6 step w/ PT facilitating ankle posture Decline RLE step through working into lengthened stride 10 minutes on core at end of session - standing?   Pt declines offer of aquatic therapy at evaluation.  Daved KATHEE Bull, PT, DPT 05/30/2024, 3:47 PM        "

## 2024-06-01 ENCOUNTER — Ambulatory Visit: Admitting: Occupational Therapy

## 2024-06-03 ENCOUNTER — Encounter: Payer: Self-pay | Admitting: Physical Therapy

## 2024-06-03 ENCOUNTER — Ambulatory Visit: Attending: Adult Health | Admitting: Occupational Therapy

## 2024-06-03 ENCOUNTER — Ambulatory Visit: Admitting: Physical Therapy

## 2024-06-03 DIAGNOSIS — R208 Other disturbances of skin sensation: Secondary | ICD-10-CM | POA: Insufficient documentation

## 2024-06-03 DIAGNOSIS — R278 Other lack of coordination: Secondary | ICD-10-CM

## 2024-06-03 DIAGNOSIS — R2681 Unsteadiness on feet: Secondary | ICD-10-CM

## 2024-06-03 DIAGNOSIS — R29818 Other symptoms and signs involving the nervous system: Secondary | ICD-10-CM | POA: Diagnosis present

## 2024-06-03 DIAGNOSIS — R209 Unspecified disturbances of skin sensation: Secondary | ICD-10-CM | POA: Diagnosis present

## 2024-06-03 DIAGNOSIS — M6281 Muscle weakness (generalized): Secondary | ICD-10-CM | POA: Diagnosis present

## 2024-06-03 DIAGNOSIS — I69354 Hemiplegia and hemiparesis following cerebral infarction affecting left non-dominant side: Secondary | ICD-10-CM | POA: Insufficient documentation

## 2024-06-03 DIAGNOSIS — R29898 Other symptoms and signs involving the musculoskeletal system: Secondary | ICD-10-CM | POA: Insufficient documentation

## 2024-06-03 NOTE — Patient Instructions (Signed)
 Access Code: [facility] URL: [email or web address] Date: 06/03/2024 Prepared by: Daved Bull  Exercises - Seated Toe Raise  - 1-2 x daily - 4 x weekly - 2 sets - 10 reps - 2-3 seconds hold - Ankle Inversion Eversion Towel Slide  - 1-2 x daily - 4 x weekly - 2 sets - 10 reps - Seated Ankle Circles  - 1-2 x daily - 4 x weekly - 2 sets - 10 reps - Seated Heel Raise  - 1-2 x daily - 4 x weekly - 2 sets - 10 reps - 2-3 seconds hold - Seated Ankle Inversion Eversion PROM  - 1 x daily - 7 x weekly - 2 sets - 10 reps - Staggered Sit-to-Stand  - 1 x daily - 4-5 x weekly - 2-3 sets - 10 reps - Seated Hamstring Stretch  - 1 x daily - 4-5 x weekly - 1 sets - 3-4 reps - 45 seconds hold - Hooklying Single Knee to Chest Stretch with Towel  - 1 x daily - 4-5 x weekly - 1 sets - 3-4 reps - 45 seconds hold - Seated Toe Towel Scrunches  - 1 x daily - 4 x weekly - 1-2 sets - 10 reps - Seated Arch Lifts  - 1 x daily - 4 x weekly - 1-2 sets - 10 reps - Standing March with Unilateral Counter Support  - 1 x daily - 5 x weekly - 2 sets - 20 reps - Bird Dog on Counter  - 1 x daily - 5 x weekly - 2 sets - 10 reps - Step Sideways with Arms Reaching  - 1 x daily - 5 x weekly - 2 sets - 10 reps

## 2024-06-03 NOTE — Therapy (Signed)
 " OUTPATIENT PHYSICAL THERAPY NEURO TREATMENT   Patient Name: Donald [name] MRN: [id number] DOB:Aug 19, 1945, 79 y.o., male Today's Date: 06/03/2024   PCP: Kip Righter, MD REFERRING PROVIDER: Sherryl Bouchard, NP  END OF SESSION:  PT End of Session - 06/03/24 1420     Visit Number 22    Number of Visits 25   17+8   Date for Recertification  06/10/24   pushed out due to scheduling delay (holidays)   Authorization Type Medicare A&B w/ [employer] supplement    Progress Note Due on Visit 30    PT Start Time 1417    PT Stop Time 1502    PT Time Calculation (min) 45 min    Equipment Utilized During Treatment Gait belt    Activity Tolerance Patient tolerated treatment well    Behavior During Therapy WFL for tasks assessed/performed          Past Medical History:  Diagnosis Date   Diabetes mellitus without complication (HCC)    Dysrhythmia    Hyperlipemia    Hypertension    large right middle cerebral artery infarct, 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 s/p IV tPA, b. source unknown, c. loop recorder placed   Snoring 07/03/2015   Past Surgical History:  Procedure Laterality Date   LOOP RECORDER IMPLANT  06/07/2013   MDT LinQ implanted by Dr Kelsie for cryptogenic stroke   LOOP RECORDER IMPLANT N/A 06/07/2013   Procedure: LOOP RECORDER IMPLANT;  Surgeon: Lynwood JONETTA Kelsie, MD;  Location: [facility] CATH LAB;  Service: Cardiovascular;  Laterality: N/A;   TEE WITHOUT CARDIOVERSION N/A 06/07/2013   Procedure: TRANSESOPHAGEAL ECHOCARDIOGRAM (TEE);  Surgeon: Leim VEAR Moose, MD;  Location: [facility] ENDOSCOPY;  Service: Cardiovascular;  Laterality: N/A;   TONSILLECTOMY     TRANSANAL HEMORRHOIDAL DEARTERIALIZATION N/A 02/18/2023   Procedure: TRANSANAL HEMORRHOIDAL DEARTERIALIZATION;  Surgeon: Debby Hila, MD;  Location: [facility] ORS;  Service: General;  Laterality: N/A;   UMBILICAL HERNIA REPAIR     Patient Active Problem List   Diagnosis Date Noted   Permanent atrial fibrillation (HCC) 05/17/2019    Acquired thrombophilia 05/17/2019   Snoring 07/03/2015   Persistent atrial fibrillation (HCC) 04/16/2015   Hypokalemia 09/20/2014   HTN (hypertension) 09/19/2014   HLD (hyperlipidemia) 09/19/2014   Diabetes mellitus without complication (HCC) 09/19/2014   Jerking 09/19/2014   Alterations of sensations, late effect of cerebrovascular disease 11/07/2013   Disturbances of vision, late effect of cerebrovascular disease 11/07/2013   Sinus bradycardia 08/08/2013   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HCC) 07/26/2013   Left-sided neglect 07/26/2013   Small vessel disease 06/07/2013   Obesity, unspecified 06/07/2013   Cerebral infarction (HCC) 06/07/2013   large right middle cerebral artery infarct, 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   Hypertension 06/03/2013   Diabetes (HCC) 06/03/2013    ONSET DATE: 06/03/2013 (CVA)  REFERRING DIAG: P30.645 (ICD-10-CM) - Hemiparesis affecting left side as late effect of cerebrovascular accident (HCC)  THERAPY DIAG:  Muscle weakness (generalized)  Other lack of coordination  Other disturbances of skin sensation  Hemiplegia and hemiparesis following cerebral infarction affecting left non-dominant side (HCC)  Other symptoms and signs involving the nervous system  Other symptoms and signs involving the musculoskeletal system  Unsteadiness on feet  Rationale for Evaluation and Treatment: Rehabilitation  SUBJECTIVE:  SUBJECTIVE STATEMENT: Donald [name]  He is feeling good today and reports his hand strength has increased.  He denies falls or acute status changes.  Pt accompanied by: self (Wife - waiting in car)  PERTINENT HISTORY: R MCA infarct 06/03/2013 S/p tPA, HTN, DM2, Afib, had 1 seizure in 2017  PAIN:  Are you having pain? No  PRECAUTIONS: Fall and Other: loop  recorder  RED FLAGS: None   WEIGHT BEARING RESTRICTIONS: No  FALLS: Has patient fallen in last 6 months? Yes. Number of falls 3  LIVING ENVIRONMENT: Lives with: lives with their spouse Lives in: House/apartment Stairs: No Has following equipment at home: Single point cane  PLOF: Requires assistive device for independence  PATIENT GOALS: To work on reaction time and getting off the floor  OBJECTIVE:  Note: Objective measures were completed at Evaluation unless otherwise noted.  DIAGNOSTIC FINDINGS:  Brain MRI 09/20/2014 IMPRESSION: 1. No acute infarct. 2. Chronic right MCA territory infarct. 3. Subtle T2 hyperintensity in the left hippocampus and left temporal lobe cortex versus artifact. Recent seizure activity is a consideration.  COGNITION: Overall cognitive status: History of cognitive impairments - at baseline - reports quicker to anger/verbalize emotions/sometimes overstimulated with sounds and distractions   SENSATION: Light touch: WFL - LLE hypersensitive/question of allodynia  COORDINATION: Mildly dysmetric LLE  EDEMA:  None significant in BLE  MUSCLE TONE: LLE: Modifed Ashworth Scale 1+ = Slight increase in muscle tone, manifested by a catch, followed by minimal resistance throughout the remainder (less than half) of the ROM  POSTURE: rounded shoulders, flexed trunk , and weight shift right - mildly flexed trunk and right weight shift in standing and with ambulation, L shoulder more rounded than R w/ mild shoulder hike  LOWER EXTREMITY ROM:     Active  Right Eval Left Eval  Hip flexion WNL WFL  Hip extension    Hip abduction    Hip adduction    Hip internal rotation    Hip external rotation    Knee flexion    Knee extension    Ankle dorsiflexion  2+  Ankle plantarflexion    Ankle inversion    Ankle eversion     (Blank rows = not tested)  LOWER EXTREMITY MMT:    MMT Right Eval Left Eval  Hip flexion    Hip extension    Hip abduction     Hip adduction    Hip internal rotation    Hip external rotation    Knee flexion    Knee extension    Ankle dorsiflexion    Ankle plantarflexion    Ankle inversion    Ankle eversion    (Blank rows = not tested)  BED MOBILITY:  Findings: Sit to supine Complete Independence Supine to sit Complete Independence Rolling to Right Complete Independence Rolling to Left Complete Independence - needs increased time and momentum for L and R Ind w/ features of tempurpedic style bed  TRANSFERS: Sit to stand: SBA  Assistive device utilized: Single point cane     Stand to sit: SBA  Assistive device utilized: Single point cane     Chair to chair: SBA  Assistive device utilized: Single point cane       RAMP:  Not tested  CURB:  Not tested  STAIRS: Not tested GAIT: Findings: Distance walked: various clinic distances and Comments: Pt has mild right reliance w/ SPC, no foot scuff/toe catch even without AFO, shortened stride and slightly diminished flexion pattern of LLE  FUNCTIONAL TESTS:  5 times sit to stand: 18.89 sec w/ heavy right trunk shortening and RUE reliance w/ L toe down vs full weight bearing  PATIENT SURVEYS:  ABC scale: The Activities-Specific Balance Confidence (ABC) Scale TBA                                                                                                                              TREATMENT DATE: 06/03/2024 - Standing March with Unilateral Counter Support  - x30 - Bird Dog on Counter  x10 each side - Step Sideways with Arms Reaching  -x10 each side practicing w/ and without UE support (recommended with at this time) -Squat w/ squigz placement on low cabinet unsupported -Forward reach w/ wide BOS to floor x10 > to floor w/ left trunk rotation x10 -Lateral reach w/ pelvic weight shift in wide seated stance x10 each side - cues to push outside BOS to engage trunk righting  PATIENT EDUCATION: Education details:  Continue HEP - additions today.  Discussed  exercises pt has been doing from internet at home (mostly seated strengthening).  Managing spasms in trunk and synergy in LUE w/ activities at home. Person educated: Patient Education method: Medical Illustrator Education comprehension: verbalized understanding and needs further education  HOME EXERCISE PROGRAM: Access Code: [facility] URL: [email or web address] Date: 03/21/2024 Prepared by: Daved Bull  Exercises - Seated Toe Raise  - 1-2 x daily - 4 x weekly - 2 sets - 10 reps - 2-3 seconds hold - Ankle Inversion Eversion Towel Slide  - 1-2 x daily - 4 x weekly - 2 sets - 10 reps - Seated Ankle Circles  - 1-2 x daily - 4 x weekly - 2 sets - 10 reps - Seated Heel Raise  - 1-2 x daily - 4 x weekly - 2 sets - 10 reps - 2-3 seconds hold - Seated Ankle Inversion Eversion PROM  - 1 x daily - 7 x weekly - 2 sets - 10 reps - Staggered Sit-to-Stand  - 1 x daily - 4-5 x weekly - 2-3 sets - 10 reps - Seated Hamstring Stretch  - 1 x daily - 4-5 x weekly - 1 sets - 3-4 reps - 45 seconds hold - Hooklying Single Knee to Chest Stretch with Towel  - 1 x daily - 4-5 x weekly - 1 sets - 3-4 reps - 45 seconds hold - Seated Toe Towel Scrunches  - 1 x daily - 4 x weekly - 1-2 sets - 10 reps - Seated Arch Lifts  - 1 x daily - 4 x weekly - 1-2 sets - 10 reps - Standing March with Unilateral Counter Support  - 1 x daily - 5 x weekly - 2 sets - 20 reps - Bird Dog on Counter  - 1 x daily - 5 x weekly - 2 sets - 10 reps - Step Sideways with Arms Reaching  - 1 x daily - 5 x weekly - 2 sets - 10  reps  Desensitization Techniques: -Light touch/pressure:  using fingertip pressure to slowly move up small segments of the affected body part until outside the area of pain and then back to where you started -Deep pressure:  using fingertips to squeeze in small segments starting outside the affected area, crossing over the affected area, and then to the other side of the affected area -Tapping:   fingertips tap with firm pressure over the affected area, can move around the affected area as well -Brushing/scratching:  use fingertips to brush/scratch over and around the affected area -Textures:  use soft and rough textured items like cotton balls vs wash cloths to brush or rub with light into firm pressure over and around the affected area  Repeat these 3-4x per day.   GOALS: Goals reviewed with patient? Yes  SHORT TERM GOALS: Target date: 04/08/2024  Pt will be independent and compliant with introductory strength and balance HEP in order to maintain functional progress and improve mobility. Baseline:  IND and compliant (11/26) Goal status: MET  2.  Pt will demonstrate adequate balance strategies to maintain upright with forward/overhead/and floor reaching. Baseline:  Very narrow limits of stability - several near falls related to this; ongoing (11/26) Goal status: IN PROGRESS  3.  Pt will be independent with desensitization strategies for LLE hypersensitivity management. Baseline: Provided - pt has handout and actively working on in OT/PT (11/26) Goal status: MET  LONG TERM GOALS: Target date: 05/06/2024  Pt will be independent and compliant with advanced and finalized strength and balance HEP in order to maintain functional progress and improve mobility. Baseline: To be established; pt feels he is doing moderately well with frequency of his HEP (12/4) Goal status: PARTIALLY MET  2.  Patient will improve ABC Scale score to >/=76% in order to demonstrate decreased risk for falls and increased confidence in balance. Baseline: 66.875% (10/14); 80% (12/4) Goal status: MET  3.  Patient will demonstrate floor recovery w/ no more than SBA in order to improve safety and home management. Baseline: Reports recent minA even w/ support surface; minA to flip from bottom to knees using support surface (12/4) Goal status: IN PROGRESS  4.  Pt will decrease 5xSTS to </=12 seconds w/  improved midline mechanics in order to demonstrate decreased risk for falls and improved functional bilateral LE strength and power. Baseline: 18.89 sec w/ heavy right trunk shortening and RUE reliance w/ L toe down vs full weight bearing; 13.97 sec w/ light BUE support and mildly R rear staggered stance (12/4) Goal status: PARTIALLY MET  GOALS (at 87/5 re-cert): Goals reviewed with patient? Yes SHORT TERM GOALS = LONG TERM GOALS: Target date: 06/03/2024  Pt will be independent and compliant with advanced and finalized strength and balance HEP in order to maintain functional progress and improve mobility. Baseline: To be established; pt feels he is doing moderately well with frequency of his HEP (12/4) Goal status: ONGOING  2.  Patient will improve ABC Scale score to >/=90% in order to demonstrate decreased risk for falls and increased confidence in balance. Baseline: 66.875% (10/14); 80% (12/4) Goal status: INITIAL  3.  Patient will demonstrate floor recovery w/ no more than SBA in order to improve safety and home management. Baseline: Reports recent minA even w/ support surface; minA to flip from bottom to knees using support surface (12/4) Goal status: ONGOING  4.  Pt will decrease 5xSTS to </=12 seconds w/ improved midline mechanics in order to demonstrate decreased risk for falls and  improved functional bilateral LE strength and power. Baseline: 18.89 sec w/ heavy right trunk shortening and RUE reliance w/ L toe down vs full weight bearing; 13.97 sec w/ light BUE support and mildly R rear staggered stance (12/4) Goal status: ONGOING  ASSESSMENT:  CLINICAL IMPRESSION: Focus of skilled PT today on progressing HEP to improve core engagement and dynamic stability.  His squat form appears more symmetrical and stable this visit.  Coordination tasks remain challenging somewhat due to left synergistic movement pattern most prevalent with fatigue.  He was challenged by core engagement with  standing march when unsupported as well as laterally reaching outside BOS to elicit trunk reactions.  Will spend more time on these style tasks in future.  Continue per POC.  OBJECTIVE IMPAIRMENTS: Abnormal gait, decreased activity tolerance, decreased balance, decreased cognition, decreased coordination, decreased endurance, decreased knowledge of use of DME, decreased strength, impaired sensation, impaired tone, improper body mechanics, and postural dysfunction.   ACTIVITY LIMITATIONS: carrying, lifting, bending, squatting, stairs, transfers, reach over head, and locomotion level  PARTICIPATION LIMITATIONS: laundry, shopping, and community activity  PERSONAL FACTORS: Age, Fitness, Past/current experiences, Time since onset of injury/illness/exacerbation, and 1-2 comorbidities: HTN, Afib are also affecting patient's functional outcome.   REHAB POTENTIAL: Good  CLINICAL DECISION MAKING: Stable/uncomplicated  EVALUATION COMPLEXITY: Moderate  PLAN:  PT FREQUENCY: 2x/week + 2x/wk  PT DURATION: 8 weeks + 4 wks  PLANNED INTERVENTIONS: [id number]- PT Re-evaluation, 97750- Physical Performance Testing, 97110-Therapeutic exercises, 97530- Therapeutic activity, [id number]- Neuromuscular re-education, 97535- Self Care, [id number]- Manual therapy, [id number]- Gait training, [phone]- Orthotic Initial, [phone]- Orthotic/Prosthetic subsequent, [phone]- Aquatic Therapy, [phone]- Electrical stimulation (manual), Patient/Family education, Balance training, Stair training, Taping, Joint mobilization, Vestibular training, and DME instructions  PLAN FOR NEXT SESSION: Address orthotic if pt requests!  Sees [facility] on Jan 9th for possible AFO option.    Floor recovery - incorporate tight space setup - repeat w/ full transition to bottom and incorporate LLE use to recover to standing - pt wants to work towards no UE support if possible (did discuss limitations and safety recs 11/20).  LLE NMR/strength/reaction time.  Core strength - work mat  table level on tasks to improve floor recovery from stomach and supine.  Reaching floor/forward/overhead. Perturbations/resisted gait/farmer's carry. Try ladder? Glut strengthening.  Righting reactions/outside BOS in sitting and standing.  Pt felt challenged w/: 4 elevated RLE for L SLS for mirror squigz Wide STS Offset squats/mini lunges Hamstring pushbacks - strap to prevent squatting Wedge STS progress to no UE support Stairs/step taps/up; incline step through Standing 10lb kettlebell hip flexion LLE (isometric DF) Walking beanbag DF SLS taps to top step on L leg > L step ups Prolonged L stance 6 step w/ PT facilitating ankle posture Decline RLE step through working into lengthened stride 10 minutes on core at end of session - standing?  Re-cert at last visit (1/8) - 1x/wk for 8 wks to match OT and allow more time for HEP per pt request/discussion on 06/03/2024.  Pt declines offer of aquatic therapy at evaluation.  Daved KATHEE Bull, PT, DPT 06/03/2024, 3:09 PM        "

## 2024-06-03 NOTE — Therapy (Signed)
 " OUTPATIENT OCCUPATIONAL THERAPY NEURO TREATMENT and RECERT  Patient Name: Donald [name] MRN: [id number] DOB:07/26/1945, 79 y.o., male Today's Date: 06/03/2024  PCP: Kip Righter, MD REFERRING PROVIDER: Sherryl Bouchard, NP  END OF SESSION:  OT End of Session - 06/03/24 1324     Visit Number 16    Number of Visits 24    Date for Recertification  07/29/24    Authorization Type MCR A&B/AARP    OT Start Time 1324    OT Stop Time 1404    OT Time Calculation (min) 40 min    Activity Tolerance Patient tolerated treatment well    Behavior During Therapy WFL for tasks assessed/performed           Past Medical History:  Diagnosis Date   Diabetes mellitus without complication (HCC)    Dysrhythmia    Hyperlipemia    Hypertension    large right middle cerebral artery infarct, 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 s/p IV tPA, b. source unknown, c. loop recorder placed   Snoring 07/03/2015   Past Surgical History:  Procedure Laterality Date   LOOP RECORDER IMPLANT  06/07/2013   MDT LinQ implanted by Dr Kelsie for cryptogenic stroke   LOOP RECORDER IMPLANT N/A 06/07/2013   Procedure: LOOP RECORDER IMPLANT;  Surgeon: Lynwood JONETTA Kelsie, MD;  Location: [facility] CATH [facility];  Service: Cardiovascular;  Laterality: N/A;   TEE WITHOUT CARDIOVERSION N/A 06/07/2013   Procedure: TRANSESOPHAGEAL ECHOCARDIOGRAM (TEE);  Surgeon: Leim VEAR Moose, MD;  Location: [facility] ENDOSCOPY;  Service: Cardiovascular;  Laterality: N/A;   TONSILLECTOMY     TRANSANAL HEMORRHOIDAL DEARTERIALIZATION N/A 02/18/2023   Procedure: TRANSANAL HEMORRHOIDAL DEARTERIALIZATION;  Surgeon: Debby Hila, MD;  Location: [facility] ORS;  Service: General;  Laterality: N/A;   UMBILICAL HERNIA REPAIR     Patient Active Problem List   Diagnosis Date Noted   Permanent atrial fibrillation (HCC) 05/17/2019   Acquired thrombophilia 05/17/2019   Snoring 07/03/2015   Persistent atrial fibrillation (HCC) 04/16/2015   Hypokalemia 09/20/2014   HTN (hypertension)  09/19/2014   HLD (hyperlipidemia) 09/19/2014   Diabetes mellitus without complication (HCC) 09/19/2014   Jerking 09/19/2014   Alterations of sensations, late effect of cerebrovascular disease 11/07/2013   Disturbances of vision, late effect of cerebrovascular disease 11/07/2013   Sinus bradycardia 08/08/2013   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HCC) 07/26/2013   Left-sided neglect 07/26/2013   Small vessel disease 06/07/2013   Obesity, unspecified 06/07/2013   Cerebral infarction (HCC) 06/07/2013   large right middle cerebral artery infarct, 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   Hypertension 06/03/2013   Diabetes (HCC) 06/03/2013   ONSET DATE: 06/03/2013 (CVA), 03/15/2024 referral date  REFERRING DIAG: P30.645 (ICD-10-CM) - Hemiplegia and hemiparesis following cerebral infarction affecting left non-dominant side (HCC)  THERAPY DIAG:  Muscle weakness (generalized)  Other lack of coordination  Other disturbances of skin sensation  Hemiplegia and hemiparesis following cerebral infarction affecting left non-dominant side (HCC)  Other symptoms and signs involving the nervous system  Other symptoms and signs involving the musculoskeletal system  Rationale for Evaluation and Treatment: Rehabilitation  SUBJECTIVE:   SUBJECTIVE STATEMENT: Pt reports his brother recommended ultra light strings and a chord pressor for his guitar but has not been able to try it out yet.   Pt accompanied by: self  PERTINENT HISTORY: R MCA infarct 06/03/2013 S/p tPA, HTN, DM2, Afib, had 1 seizure in 2017   DIAGNOSTIC FINDINGS:  Brain MRI 09/20/2014 IMPRESSION: 1. No acute infarct. 2. Chronic right MCA territory infarct. 3. Subtle T2 hyperintensity  in the left hippocampus and left temporal lobe cortex versus artifact. Recent seizure activity is a consideration  PRECAUTIONS: Fall and Other: loop recorder, L sided weakness  WEIGHT BEARING RESTRICTIONS: No  PAIN:  Are you having pain? No  FALLS: Has  patient fallen in last 6 months? Yes. Number of falls 4 in the last year  LIVING ENVIRONMENT: Lives with: lives with their spouse Lives in: House/apartment 1 level Stairs: No Has following equipment at home: Single point cane, shower chair/bench with back, walk in shower  PLOF: Requires assistive device for independence  PATIENT GOALS: I'd like to be able to use my left hand more functionally. I want to improve my coordination and reaction time.  OBJECTIVE:  Note: Objective measures were completed at Evaluation unless otherwise noted.  HAND DOMINANCE: Right  ADLs: Overall ADLs: Independent  Equipment: Shower seat with back and Walk in shower  IADLs: Cooks and cleans bathrooms and kitchen Shopping:  Light housekeeping: Independent Meal Prep: Independent Community mobility: Wife completes  Medication management: Independent Landscape architect: Independent Handwriting: did not assess d/t nondominant L hand being affected  MOBILITY STATUS: ambulates with SPC  POSTURE COMMENTS:  rounded shoulders, flexed trunk , and weight shift right - mildly flexed trunk and right weight shift in standing and with ambulation, L shoulder more rounded than R w/ mild shoulder hike   ACTIVITY TOLERANCE: Activity tolerance: no changes  FUNCTIONAL OUTCOME MEASURES: PSFS: 05/05/2024:  3.7 total score 06/03/2024:   UPPER EXTREMITY ROM:  WNL  UPPER EXTREMITY MMT:   4-/5 shoulder   HAND FUNCTION: Grip strength: Right: 73.1 lbs; Left: 58.2 lbs 06/03/2024:  Lateral pinch: Right: 22 lbs, Left: 20 lbs 3 point pinch: Right: 15 lbs, Left: 11 lbs Tip pinch: Right 14 lbs, Left: 10 lbs  COORDINATION: 9 Hole Peg test: Right: 26.32 sec; Left: 65.08 sec Box and Blocks:  Right 49 blocks, Left 29 blocks  SENSATION: Reports hypersensitivity in fingertips  EDEMA: Pt reports hx of edema but it is rare now.  MUSCLE TONE: LUE: Modifed Ashworth Scale 1 = Slight increase in muscle tone, manifested by a  catch and release or by minimal resistance at the end of the range of motion when the affected part(s) is moved in flexion or extension  COGNITION: Overall cognitive status: Within functional limits for tasks assessed  VISION: Subjective report: Pt reports having small cataracts but it is not affecting vision at this time Baseline vision: Wears glasses all the time Visual history: cataracts well kept  VISION ASSESSMENT: To be further assessed in functional context  PERCEPTION: WFL  PRAXIS: WFL  OBSERVATIONS: Impaired FM coordination, grip strength, strength in L hand and shoulder, hypersensitivity in fingertips of L hand                                                                                                                           TREATMENT:   - Self-care/home management completed for duration as noted below including: Objective  measures assessed as noted in Goals section to determine progression towards goals. Therapist reviewed goals with patient and updated patient progression.  Additional functional limitations identified and goals were created with pt's collaboration as noted below. PATIENT EDUCATION: Education details: POC Person educated: Patient Education method: Explanation, Demonstration, Tactile cues, and Verbal cues Education comprehension: verbalized understanding, returned demonstration, verbal cues required, and tactile cues required  HOME EXERCISE PROGRAM: 04/04/24: Desensitization 04/07/24: Putty, coordination 04/14/24: Golf solitaire 11.17.25: Access Code: [email or web address] URL: [email or web address] Date: 04/18/2024 Prepared by: Rocky Dutch  Exercises - Standing Shoulder Horizontal Abduction with Resistance  - 1 x daily - 5 x weekly - 2 sets - 15 reps - Standing Shoulder Abduction with Self-Anchored Resistance  - 1 x daily - 5 x weekly - 2 sets - 15 reps - Standing Shoulder Flexion with Self-Anchored Resistance  - 1 x daily - 5 x weekly - 2  sets - 15 reps - Seated Shoulder Extension with Resistance  - 1 x daily - 5 x weekly - 2 sets - 10 reps   GOALS: Goals reviewed with patient? Yes  LONG TERM GOALS: Target date: 06/04/24  Pt will demonstrate an increase of at least 2.0 score of average PSFS score or a 3 point increase in individual activity score in PSFS Baseline: 1.0 05/05/2024: 3.7 total score (see above for individual activity scores) 06/03/2024: 4.0 total score Goal status:  MET  2.  Pt will demonstrate improved L hand FM coordination by a score of 60 seconds or less in 9HPT Baseline: Right: 26.32 sec; Left: 65.08 sec 05/05/2024: 59 seconds Goal status:  MET  3.  Pt will demonstrate improved grip strength in L hand by scoring at least 65 pounds Baseline: Right: 73.1 lbs; Left: 58.2 lbs 05/05/2024: 58.2 lbs 05/23/24: 48.4 lbs  06/03/2024: 56.6, 51.3, 48 lbs Goal status:  NO CHANGE  4.  Pt will demonstrate improved L coordination by a score of at least 39 blocks on Box and Blocks test Baseline: 29 blocks 05/05/2024: 36 blocks 05/23/24: 33 blocks 06/03/2024: 40 blocks Goal status:  MET  5.  Pt will report improvement with picking up items using the L hand with less smarting. Baseline:  Goal status: INITIAL  6.  Patient will demonstrate at least 3 lb improvement with L 3 point and tip pinch strengths as needed to assist with ADLs, IADLs, and leisure activities.  Baseline: 3 point pinch: Right: 15 lbs, Left: 11 lbs Tip pinch: Right 14 lbs, Left: 10 lbs Goal status: INITIAL  7.  Pt will improve Blazepod reaction time with L shoulder in flexion and abduction using isolated index finger indicating improved shoulder stability and gross motor coordination. Baseline: TBA Goal status: INITIAL   ASSESSMENT:  CLINICAL IMPRESSION: Pt seen for OT today secondary to impaired functional use of LUE following chronic CVA. Pt has met all STGs and has identified new LTGs to work towards. Recommend extension of OT services as  noted below to address remaining deficits or until max rehab potential has been met.   PERFORMANCE DEFICITS: in functional skills including IADLs, coordination, dexterity, sensation, strength, Fine motor control, Gross motor control, decreased knowledge of precautions, and UE functional use, cognitive skills including safety awareness, and psychosocial skills including coping strategies and environmental adaptation.   IMPAIRMENTS: are limiting patient from IADLs, work, and leisure.   CO-MORBIDITIES: may have co-morbidities  that affects occupational performance. Patient will benefit from skilled OT to address above impairments and improve overall function  REHAB POTENTIAL: Good  PLAN:  OT FREQUENCY: Additional 1x/week  OT DURATION: Additional  8 weeks  PLANNED INTERVENTIONS: 97168 OT Re-evaluation, 97535 self care/ADL training, 02889 therapeutic exercise, 97530 therapeutic activity, 97112 neuromuscular re-education, passive range of motion, psychosocial skills training, coping strategies training, patient/family education, and DME and/or AE instructions  RECOMMENDED OTHER SERVICES: none at this time  CONSULTED AND AGREED WITH PLAN OF CARE: Patient  PLAN FOR NEXT SESSION: Set Blazepod goal; pinch strengthening; shoulder stability/strengthening    Jocelyn CHRISTELLA Bottom, OT 06/03/2024, 4:37 PM           "

## 2024-06-06 ENCOUNTER — Ambulatory Visit: Admitting: Physical Therapy

## 2024-06-09 ENCOUNTER — Ambulatory Visit: Admitting: Occupational Therapy

## 2024-06-09 ENCOUNTER — Ambulatory Visit: Admitting: Physical Therapy

## 2024-06-09 DIAGNOSIS — R29898 Other symptoms and signs involving the musculoskeletal system: Secondary | ICD-10-CM

## 2024-06-09 DIAGNOSIS — M6281 Muscle weakness (generalized): Secondary | ICD-10-CM

## 2024-06-09 DIAGNOSIS — R29818 Other symptoms and signs involving the nervous system: Secondary | ICD-10-CM

## 2024-06-09 DIAGNOSIS — R278 Other lack of coordination: Secondary | ICD-10-CM

## 2024-06-09 NOTE — Therapy (Signed)
 " OUTPATIENT OCCUPATIONAL THERAPY NEURO TREATMENT   Patient Name: Donald [name] MRN: [id number] DOB:29-Mar-1946, 79 y.o., male Today's Date: 06/09/2024  PCP: Kip Righter, MD REFERRING PROVIDER: Sherryl Bouchard, NP  END OF SESSION:  OT End of Session - 06/09/24 1413     Visit Number 17    Number of Visits 24    Date for Recertification  07/29/24    Authorization Type MCR A&B/AARP    OT Start Time 1414    OT Stop Time 1453    OT Time Calculation (min) 39 min    Activity Tolerance Patient tolerated treatment well    Behavior During Therapy WFL for tasks assessed/performed           Past Medical History:  Diagnosis Date   Diabetes mellitus without complication (HCC)    Dysrhythmia    Hyperlipemia    Hypertension    large right middle cerebral artery infarct, 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 s/p IV tPA, b. source unknown, c. loop recorder placed   Snoring 07/03/2015   Past Surgical History:  Procedure Laterality Date   LOOP RECORDER IMPLANT  06/07/2013   MDT LinQ implanted by Dr Kelsie for cryptogenic stroke   LOOP RECORDER IMPLANT N/A 06/07/2013   Procedure: LOOP RECORDER IMPLANT;  Surgeon: Lynwood JONETTA Kelsie, MD;  Location: [facility] CATH LAB;  Service: Cardiovascular;  Laterality: N/A;   TEE WITHOUT CARDIOVERSION N/A 06/07/2013   Procedure: TRANSESOPHAGEAL ECHOCARDIOGRAM (TEE);  Surgeon: Leim VEAR Moose, MD;  Location: [facility] ENDOSCOPY;  Service: Cardiovascular;  Laterality: N/A;   TONSILLECTOMY     TRANSANAL HEMORRHOIDAL DEARTERIALIZATION N/A 02/18/2023   Procedure: TRANSANAL HEMORRHOIDAL DEARTERIALIZATION;  Surgeon: Debby Hila, MD;  Location: [facility] ORS;  Service: General;  Laterality: N/A;   UMBILICAL HERNIA REPAIR     Patient Active Problem List   Diagnosis Date Noted   Permanent atrial fibrillation (HCC) 05/17/2019   Acquired thrombophilia 05/17/2019   Snoring 07/03/2015   Persistent atrial fibrillation (HCC) 04/16/2015   Hypokalemia 09/20/2014   HTN (hypertension) 09/19/2014    HLD (hyperlipidemia) 09/19/2014   Diabetes mellitus without complication (HCC) 09/19/2014   Jerking 09/19/2014   Alterations of sensations, late effect of cerebrovascular disease 11/07/2013   Disturbances of vision, late effect of cerebrovascular disease 11/07/2013   Sinus bradycardia 08/08/2013   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HCC) 07/26/2013   Left-sided neglect 07/26/2013   Small vessel disease 06/07/2013   Obesity, unspecified 06/07/2013   Cerebral infarction (HCC) 06/07/2013   large right middle cerebral artery infarct, 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 He appreciative of being seen today by OT.   Pt accompanied by: self  PERTINENT HISTORY: R MCA infarct 06/03/2013 S/p tPA, HTN, DM2, Afib, had 1 seizure in 2017   DIAGNOSTIC FINDINGS:  Brain MRI 09/20/2014 IMPRESSION: 1. No acute infarct. 2. Chronic right MCA territory infarct. 3. Subtle T2 hyperintensity in the left hippocampus and left temporal lobe cortex versus artifact. Recent seizure activity is a consideration  PRECAUTIONS: Fall and Other: loop recorder, L sided  weakness  WEIGHT BEARING RESTRICTIONS: No  PAIN:  Are you having pain? No  FALLS: Has patient fallen in last 6 months? Yes. Number of falls 4 in the last year  LIVING ENVIRONMENT: Lives with: lives with their spouse Lives in: House/apartment 1 level Stairs: No Has  following equipment at home: Single point cane, shower chair/bench with back, walk in shower  PLOF: Requires assistive device for independence  PATIENT GOALS: I'd like to be able to use my left hand more functionally. I want to improve my coordination and reaction time.  OBJECTIVE:  Note: Objective measures were completed at Evaluation unless otherwise noted.  HAND DOMINANCE: Right  ADLs: Overall ADLs: Independent  Equipment: Shower seat with back and Walk in shower  IADLs: Cooks and cleans bathrooms and kitchen Shopping:  Light housekeeping: Independent Meal Prep: Independent Community mobility: Wife completes  Medication management: Independent Landscape architect: Independent Handwriting: did not assess d/t nondominant L hand being affected  MOBILITY STATUS: ambulates with SPC  POSTURE COMMENTS:  rounded shoulders, flexed trunk , and weight shift right - mildly flexed trunk and right weight shift in standing and with ambulation, L shoulder more rounded than R w/ mild shoulder hike   ACTIVITY TOLERANCE: Activity tolerance: no changes  FUNCTIONAL OUTCOME MEASURES: PSFS: 05/05/2024:  3.7 total score 06/03/2024:   UPPER EXTREMITY ROM:  WNL  UPPER EXTREMITY MMT:   4-/5 shoulder   HAND FUNCTION: Grip strength: Right: 73.1 lbs; Left: 58.2 lbs 06/03/2024:  Lateral pinch: Right: 22 lbs, Left: 20 lbs 3 point pinch: Right: 15 lbs, Left: 11 lbs Tip pinch: Right 14 lbs, Left: 10 lbs  COORDINATION: 9 Hole Peg test: Right: 26.32 sec; Left: 65.08 sec Box and Blocks:  Right 49 blocks, Left 29 blocks  SENSATION: Reports hypersensitivity in fingertips  EDEMA: Pt reports hx of edema but it is rare now.  MUSCLE TONE: LUE: Modifed Ashworth Scale 1 = Slight increase in muscle tone, manifested by a catch and release or by minimal resistance at the end of the range of motion when the affected part(s) is moved in flexion or extension  COGNITION: Overall cognitive status: Within  functional limits for tasks assessed  VISION: Subjective report: Pt reports having small cataracts but it is not affecting vision at this time Baseline vision: Wears glasses all the time Visual history: cataracts well kept  VISION ASSESSMENT: To be further assessed in functional context  PERCEPTION: WFL  PRAXIS: WFL  OBSERVATIONS: Impaired FM coordination, grip strength, strength in L hand and shoulder, hypersensitivity in fingertips of L hand                                                                                                                           TREATMENT:   - Therapeutic exercises completed for duration as noted below including: OT initiated shoulder strengthening exercises including:  - Shoulder Alphabet with Ball at Wall  - 1 x daily - 5 x weekly - 2 sets -  Prone Scapular Slide with Shoulder Extension  - 1 x daily - 5 x weekly - 2 sets - 10 reps - 5 second hold - Prone Scapular Presses  - 1 x daily - 5 x weekly - 2 sets - 10 reps - 5 second hold - Supine Shoulder Retraction Press  - 1 x daily - 5 x weekly - 2 sets - 10 reps - 5 second hold OT placed  3 BlazePods in front of patient and had patient tap pods with LUE as pods lit up using random mode forfine motor coordination, gross motor coordination, reaction time, and endurance/stamina.   Hits were as follows:  Palm of LUE: 111 hits for 2 min duration and .8 sec reaction time Index finger of LUE: 100 hits for 2 min duration and 1.017 sec reaction time  PATIENT EDUCATION: Education details: see above Person educated: Patient Education method: Explanation, Demonstration, Tactile cues, and Verbal cues Education comprehension: verbalized understanding, returned demonstration, verbal cues required, and tactile cues required  HOME EXERCISE PROGRAM: 04/04/24: Desensitization 04/07/24: Putty, coordination 04/14/24: Golf solitaire 11.17.25: Access Code: [email or web address] Theraband 06/09/2024: shoulder  stability   GOALS: Goals reviewed with patient? Yes  LONG TERM GOALS: Target date: 06/04/24  Pt will demonstrate an increase of at least 2.0 score of average PSFS score or a 3 point increase in individual activity score in PSFS Baseline: 1.0 05/05/2024: 3.7 total score (see above for individual activity scores) 06/03/2024: 4.0 total score Goal status:  MET  2.  Pt will demonstrate improved L hand FM coordination by a score of 60 seconds or less in 9HPT Baseline: Right: 26.32 sec; Left: 65.08 sec 05/05/2024: 59 seconds Goal status:  MET  3.  Pt will demonstrate improved grip strength in L hand by scoring at least 65 pounds Baseline: Right: 73.1 lbs; Left: 58.2 lbs 05/05/2024: 58.2 lbs 05/23/24: 48.4 lbs  06/03/2024: 56.6, 51.3, 48 lbs Goal status:  NO CHANGE  4.  Pt will demonstrate improved L coordination by a score of at least 39 blocks on Box and Blocks test Baseline: 29 blocks 05/05/2024: 36 blocks 05/23/24: 33 blocks 06/03/2024: 40 blocks Goal status:  MET  5.  Pt will report improvement with picking up items using the L hand with less smarting. Baseline:  Goal status: INITIAL  6.  Patient will demonstrate at least 3 lb improvement with L 3 point and tip pinch strengths as needed to assist with ADLs, IADLs, and leisure activities.  Baseline: 3 point pinch: Right: 15 lbs, Left: 11 lbs Tip pinch: Right 14 lbs, Left: 10 lbs Goal status: INITIAL  7.  Pt will improve Blazepod reaction time with L shoulder in flexion and abduction using isolated index finger indicating improved shoulder stability and gross motor coordination. Baseline: TBA Goal status: INITIAL   ASSESSMENT:  CLINICAL IMPRESSION: Pt seen for OT today secondary to impaired functional use of LUE following chronic CVA. Pt has met all STGs and has identified new LTGs to work towards. Recommend extension of OT services as noted below to address remaining deficits or until max rehab potential has been met.    PERFORMANCE DEFICITS: in functional skills including IADLs, coordination, dexterity, sensation, strength, Fine motor control, Gross motor control, decreased knowledge of precautions, and UE functional use, cognitive skills including safety awareness, and psychosocial skills including coping strategies and environmental adaptation.   IMPAIRMENTS: are limiting patient from IADLs, work, and leisure.   CO-MORBIDITIES: may have co-morbidities  that affects occupational performance. Patient will benefit from skilled  OT to address above impairments and improve overall function  REHAB POTENTIAL: Good  PLAN:  OT FREQUENCY: Additional 1x/week  OT DURATION: Additional  8 weeks  PLANNED INTERVENTIONS: 97168 OT Re-evaluation, 97535 self care/ADL training, 02889 therapeutic exercise, 97530 therapeutic activity, 97112 neuromuscular re-education, passive range of motion, psychosocial skills training, coping strategies training, patient/family education, and DME and/or AE instructions  RECOMMENDED OTHER SERVICES: none at this time  CONSULTED AND AGREED WITH PLAN OF CARE: Patient  PLAN FOR NEXT SESSION: Go to mat -Review theraband and new shoulder exercises; pinch strengthening; shoulder stability/strengthening    Jocelyn CHRISTELLA Bottom, OT 06/09/2024, 3:10 PM           "

## 2024-06-09 NOTE — Patient Instructions (Signed)
 Donald [name]

## 2024-06-10 ENCOUNTER — Ambulatory Visit

## 2024-06-10 ENCOUNTER — Ambulatory Visit: Admitting: Physical Therapy

## 2024-06-17 ENCOUNTER — Ambulatory Visit: Admitting: Physical Therapy

## 2024-06-17 ENCOUNTER — Ambulatory Visit

## 2024-06-17 ENCOUNTER — Encounter: Payer: Self-pay | Admitting: Physical Therapy

## 2024-06-17 DIAGNOSIS — R29818 Other symptoms and signs involving the nervous system: Secondary | ICD-10-CM

## 2024-06-17 DIAGNOSIS — R2681 Unsteadiness on feet: Secondary | ICD-10-CM

## 2024-06-17 DIAGNOSIS — R29898 Other symptoms and signs involving the musculoskeletal system: Secondary | ICD-10-CM

## 2024-06-17 DIAGNOSIS — R278 Other lack of coordination: Secondary | ICD-10-CM

## 2024-06-17 DIAGNOSIS — M6281 Muscle weakness (generalized): Secondary | ICD-10-CM | POA: Diagnosis not present

## 2024-06-17 DIAGNOSIS — I69354 Hemiplegia and hemiparesis following cerebral infarction affecting left non-dominant side: Secondary | ICD-10-CM

## 2024-06-17 DIAGNOSIS — R208 Other disturbances of skin sensation: Secondary | ICD-10-CM

## 2024-06-17 NOTE — Therapy (Signed)
 " OUTPATIENT OCCUPATIONAL THERAPY NEURO TREATMENT   Patient Name: Donald [name] MRN: [id number] DOB:[birth year], 79 y.o., male Today's Date: 06/17/2024  PCP: Kip Righter, MD REFERRING PROVIDER: Sherryl Bouchard, NP  END OF SESSION:  OT End of Session - 06/17/24 1453     Visit Number 18    Number of Visits 24    Date for Recertification  07/29/24    Authorization Type MCR A&B/[employer]    Progress Note Due on Visit 20    OT Start Time 1452    OT Stop Time 1530    OT Time Calculation (min) 38 min    Activity Tolerance Patient tolerated treatment well    Behavior During Therapy [facility] for tasks assessed/performed           Past Medical History:  Diagnosis Date   Diabetes mellitus without complication (HCC)    Dysrhythmia    Hyperlipemia    Hypertension    large right middle cerebral artery infarct, 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 s/p IV tPA, b. source unknown, c. loop recorder placed   Snoring 07/03/2015   Past Surgical History:  Procedure Laterality Date   LOOP RECORDER IMPLANT  06/07/2013   MDT LinQ implanted by Dr Kelsie for cryptogenic stroke   LOOP RECORDER IMPLANT N/A 06/07/2013   Procedure: LOOP RECORDER IMPLANT;  Surgeon: Lynwood JONETTA Kelsie, MD;  Location: [facility] CATH [facility];  Service: Cardiovascular;  Laterality: N/A;   TEE WITHOUT CARDIOVERSION N/A 06/07/2013   Procedure: TRANSESOPHAGEAL ECHOCARDIOGRAM (TEE);  Surgeon: Leim VEAR Moose, MD;  Location: [facility] ENDOSCOPY;  Service: Cardiovascular;  Laterality: N/A;   TONSILLECTOMY     TRANSANAL HEMORRHOIDAL DEARTERIALIZATION N/A 02/18/2023   Procedure: TRANSANAL HEMORRHOIDAL DEARTERIALIZATION;  Surgeon: Debby Hila, MD;  Location: [facility] ORS;  Service: General;  Laterality: N/A;   UMBILICAL HERNIA REPAIR     Patient Active Problem List   Diagnosis Date Noted   Permanent atrial fibrillation (HCC) 05/17/2019   Acquired thrombophilia 05/17/2019   Snoring 07/03/2015   Persistent atrial fibrillation (HCC) 04/16/2015   Hypokalemia 09/20/2014    HTN (hypertension) 09/19/2014   HLD (hyperlipidemia) 09/19/2014   Diabetes mellitus without complication (HCC) 09/19/2014   Jerking 09/19/2014   Alterations of sensations, late effect of cerebrovascular disease 11/07/2013   Disturbances of vision, late effect of cerebrovascular disease 11/07/2013   Sinus bradycardia 08/08/2013   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HCC) 07/26/2013   Left-sided neglect 07/26/2013   Small vessel disease 06/07/2013   Obesity, unspecified 06/07/2013   Cerebral infarction (HCC) 06/07/2013   large right middle cerebral artery infarct, 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   Hypertension 06/03/2013   Diabetes (HCC) 06/03/2013   ONSET DATE: 06/03/2013 (CVA), 03/15/2024 referral date  REFERRING DIAG: P30.645 (ICD-10-CM) - Hemiplegia and hemiparesis following cerebral infarction affecting left non-dominant side (HCC)  THERAPY DIAG:  Muscle weakness (generalized)  Other lack of coordination  Other symptoms and signs involving the nervous system  Other symptoms and signs involving the musculoskeletal system  Hemiplegia and hemiparesis following cerebral infarction affecting left non-dominant side (HCC)  Other disturbances of skin sensation  Rationale for Evaluation and Treatment: Rehabilitation  SUBJECTIVE:   SUBJECTIVE STATEMENT: Pt reported some fatigue post PT but agreeable to participate.  Pt accompanied by: self  PERTINENT HISTORY: R MCA infarct 06/03/2013 S/p tPA, HTN, DM2, Afib, had 1 seizure in 2017   DIAGNOSTIC FINDINGS:  Brain MRI 09/20/2014 IMPRESSION: 1. No acute infarct. 2. Chronic right MCA territory infarct. 3. Subtle T2 hyperintensity in the left hippocampus and left temporal lobe  cortex versus artifact. Recent seizure activity is a consideration  PRECAUTIONS: Fall and Other: loop recorder, L sided weakness  WEIGHT BEARING RESTRICTIONS: No  PAIN:  Are you having pain? No  FALLS: Has patient fallen in last 6 months? Yes. Number of  falls 4 in the last year  LIVING ENVIRONMENT: Lives with: lives with their spouse Lives in: House/apartment 1 level Stairs: No Has following equipment at home: Single point cane, shower chair/bench with back, walk in shower  PLOF: Requires assistive device for independence  PATIENT GOALS: I'd like to be able to use my left hand more functionally. I want to improve my coordination and reaction time.  OBJECTIVE:  Note: Objective measures were completed at Evaluation unless otherwise noted.  HAND DOMINANCE: Right  ADLs: Overall ADLs: Independent  Equipment: Shower seat with back and Walk in shower  IADLs: Cooks and cleans bathrooms and kitchen Shopping:  Light housekeeping: Independent Meal Prep: Independent Community mobility: Wife completes  Medication management: Independent Landscape architect: Independent Handwriting: did not assess d/t nondominant L hand being affected  MOBILITY STATUS: ambulates with SPC  POSTURE COMMENTS:  rounded shoulders, flexed trunk , and weight shift right - mildly flexed trunk and right weight shift in standing and with ambulation, L shoulder more rounded than R w/ mild shoulder hike   ACTIVITY TOLERANCE: Activity tolerance: no changes  FUNCTIONAL OUTCOME MEASURES: PSFS: 05/05/2024:  3.7 total score 06/03/2024:   UPPER EXTREMITY ROM:  WNL  UPPER EXTREMITY MMT:   4-/5 shoulder   HAND FUNCTION: Grip strength: Right: 73.1 lbs; Left: 58.2 lbs 06/03/2024:  Lateral pinch: Right: 22 lbs, Left: 20 lbs 3 point pinch: Right: 15 lbs, Left: 11 lbs Tip pinch: Right 14 lbs, Left: 10 lbs  COORDINATION: 9 Hole Peg test: Right: 26.32 sec; Left: 65.08 sec Box and Blocks:  Right 49 blocks, Left 29 blocks  SENSATION: Reports hypersensitivity in fingertips  EDEMA: Pt reports hx of edema but it is rare now.  MUSCLE TONE: LUE: Modifed Ashworth Scale 1 = Slight increase in muscle tone, manifested by a catch and release or by minimal resistance at the  end of the range of motion when the affected part(s) is moved in flexion or extension  COGNITION: Overall cognitive status: Within functional limits for tasks assessed  VISION: Subjective report: Pt reports having small cataracts but it is not affecting vision at this time Baseline vision: Wears glasses all the time Visual history: cataracts well kept  VISION ASSESSMENT: To be further assessed in functional context  PERCEPTION: WFL  PRAXIS: WFL  OBSERVATIONS: Impaired FM coordination, grip strength, strength in L hand and shoulder, hypersensitivity in fingertips of L hand                                                                                                                           TREATMENT:   - Therapeutic activities completed for duration as noted below including: Donned 3# wrist weight on LUE for improving shoulder  strength/stability. Patient used L hand to assemble small Jenga tower, and then removed approximately 10 pieces in 2 rounds individually until the tower fell over for fine motor coordination of affected extremity. Placement and removal of yellow, red, green, blue, and black resistive clips with use of left 3 point pinch for strengthening of affected extremity.  - Therapeutic exercises completed for duration as noted below including: Reviewed shoulder alphabet with ball at the wall with no cues required of proper form. With tan flex bar completed 15 reps wrist flexion/extension and radial/ulnar deviation.  PATIENT EDUCATION: Education details: see above Person educated: Patient Education method: Explanation, Demonstration, Tactile cues, and Verbal cues Education comprehension: verbalized understanding, returned demonstration, verbal cues required, and tactile cues required  HOME EXERCISE PROGRAM: 04/04/24: Desensitization 04/07/24: Putty, coordination 04/14/24: Golf solitaire 11.17.25: Access Code: [email or web address] Theraband 06/09/2024: shoulder  stability   GOALS: Goals reviewed with patient? Yes  LONG TERM GOALS: Target date: 06/04/24  Pt will demonstrate an increase of at least 2.0 score of average PSFS score or a 3 point increase in individual activity score in PSFS Baseline: 1.0 05/05/2024: 3.7 total score (see above for individual activity scores) 06/03/2024: 4.0 total score Goal status:  MET  2.  Pt will demonstrate improved L hand FM coordination by a score of 60 seconds or less in 9HPT Baseline: Right: 26.32 sec; Left: 65.08 sec 05/05/2024: 59 seconds Goal status:  MET  3.  Pt will demonstrate improved grip strength in L hand by scoring at least 65 pounds Baseline: Right: 73.1 lbs; Left: 58.2 lbs 05/05/2024: 58.2 lbs 05/23/24: 48.4 lbs  06/03/2024: 56.6, 51.3, 48 lbs Goal status:  NO CHANGE  4.  Pt will demonstrate improved L coordination by a score of at least 39 blocks on Box and Blocks test Baseline: 29 blocks 05/05/2024: 36 blocks 05/23/24: 33 blocks 06/03/2024: 40 blocks Goal status:  MET  5.  Pt will report improvement with picking up items using the L hand with less smarting. Baseline:  Goal status: INITIAL  6.  Patient will demonstrate at least 3 lb improvement with L 3 point and tip pinch strengths as needed to assist with ADLs, IADLs, and leisure activities.  Baseline: 3 point pinch: Right: 15 lbs, Left: 11 lbs Tip pinch: Right 14 lbs, Left: 10 lbs Goal status: INITIAL  7.  Pt will improve Blazepod reaction time with L shoulder in flexion and abduction using isolated index finger indicating improved shoulder stability and gross motor coordination. Baseline: TBA Goal status: INITIAL   ASSESSMENT:  CLINICAL IMPRESSION: Pt seen for OT today secondary to impaired functional use of LUE following chronic CVA. Pt participating well with shoulder stability activities. Pt would benefit from continued skilled OT services to work on new LTGs further until all goals met or until max rehab potential is  reached.  PERFORMANCE DEFICITS: in functional skills including IADLs, coordination, dexterity, sensation, strength, Fine motor control, Gross motor control, decreased knowledge of precautions, and UE functional use, cognitive skills including safety awareness, and psychosocial skills including coping strategies and environmental adaptation.   IMPAIRMENTS: are limiting patient from IADLs, work, and leisure.   CO-MORBIDITIES: may have co-morbidities  that affects occupational performance. Patient will benefit from skilled OT to address above impairments and improve overall function  REHAB POTENTIAL: Good  PLAN:  OT FREQUENCY: Additional 1x/week  OT DURATION: Additional  8 weeks  PLANNED INTERVENTIONS: 97168 OT Re-evaluation, 97535 self care/ADL training, [id number] therapeutic exercise, 97530 therapeutic activity, 97112 neuromuscular re-education, passive range of  motion, psychosocial skills training, coping strategies training, patient/family education, and DME and/or AE instructions  RECOMMENDED OTHER SERVICES: none at this time  CONSULTED AND AGREED WITH PLAN OF CARE: Patient  PLAN FOR NEXT SESSION: Go to mat -Review theraband and new shoulder exercises; pinch strengthening; shoulder stability/strengthening    Rocky Dutch, OT 06/17/2024, 2:55 PM           "

## 2024-06-17 NOTE — Therapy (Signed)
 " OUTPATIENT PHYSICAL THERAPY NEURO TREATMENT - RECERTIFICATION   Patient Name: Donald [name] MRN: [id number] DOB:[birth year], 79 y.o., male Today's Date: 06/17/2024   PCP: Donald Righter, MD REFERRING PROVIDER: Sherryl Bouchard, NP  END OF SESSION:  PT End of Session - 06/17/24 1409     Visit Number 23    Number of Visits 25   17+8   Date for Recertification  08/19/24   pushed out due to multi-D scheduling needs   Authorization Type Medicare A&B w/ [employer] supplement    Progress Note Due on Visit 30    PT Start Time 1403    PT Stop Time 1450    PT Time Calculation (min) 47 min    Equipment Utilized During Treatment Gait belt    Activity Tolerance Patient tolerated treatment well    Behavior During Therapy WFL for tasks assessed/performed          Past Medical History:  Diagnosis Date   Diabetes mellitus without complication (HCC)    Dysrhythmia    Hyperlipemia    Hypertension    large right middle cerebral artery infarct, 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 s/p IV tPA, b. source unknown, c. loop recorder placed   Snoring 07/03/2015   Past Surgical History:  Procedure Laterality Date   LOOP RECORDER IMPLANT  06/07/2013   MDT LinQ implanted by Dr Donald [name] for cryptogenic stroke   LOOP RECORDER IMPLANT N/A 06/07/2013   Procedure: LOOP RECORDER IMPLANT;  Surgeon: Donald Donald Kelsie, MD;  Location: [facility] CATH LAB;  Service: Cardiovascular;  Laterality: N/A;   TEE WITHOUT CARDIOVERSION N/A 06/07/2013   Procedure: TRANSESOPHAGEAL ECHOCARDIOGRAM (TEE);  Surgeon: Donald VEAR Moose, MD;  Location: [facility] ENDOSCOPY;  Service: Cardiovascular;  Laterality: N/A;   TONSILLECTOMY     TRANSANAL HEMORRHOIDAL DEARTERIALIZATION N/A 02/18/2023   Procedure: TRANSANAL HEMORRHOIDAL DEARTERIALIZATION;  Surgeon: Donald Hila, MD;  Location: [facility] ORS;  Service: General;  Laterality: N/A;   UMBILICAL HERNIA REPAIR     Patient Active Problem List   Diagnosis Date Noted   Permanent atrial fibrillation (HCC)  05/17/2019   Acquired thrombophilia 05/17/2019   Snoring 07/03/2015   Persistent atrial fibrillation (HCC) 04/16/2015   Hypokalemia 09/20/2014   HTN (hypertension) 09/19/2014   HLD (hyperlipidemia) 09/19/2014   Diabetes mellitus without complication (HCC) 09/19/2014   Jerking 09/19/2014   Alterations of sensations, late effect of cerebrovascular disease 11/07/2013   Disturbances of vision, late effect of cerebrovascular disease 11/07/2013   Sinus bradycardia 08/08/2013   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HCC) 07/26/2013   Left-sided neglect 07/26/2013   Small vessel disease 06/07/2013   Obesity, unspecified 06/07/2013   Cerebral infarction (HCC) 06/07/2013   large right middle cerebral artery infarct, 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   Hypertension 06/03/2013   Diabetes (HCC) 06/03/2013    ONSET DATE: 06/03/2013 (CVA)  REFERRING DIAG: P30.645 (ICD-10-CM) - Hemiparesis affecting left side as late effect of cerebrovascular accident (HCC)  THERAPY DIAG:  Muscle weakness (generalized)  Other lack of coordination  Other symptoms and signs involving the nervous system  Other symptoms and signs involving the musculoskeletal system  Other disturbances of skin sensation  Hemiplegia and hemiparesis following cerebral infarction affecting left non-dominant side (HCC)  Unsteadiness on feet  Rationale for Evaluation and Treatment: Rehabilitation  SUBJECTIVE:  SUBJECTIVE STATEMENT: Donald [name]  He had some appt mix ups which delayed his return to PT.  He denies falls or acute status changes.  He ordered a custom AFO - [employer] at [facility].  Pt accompanied by: self (Wife - waiting in car)  PERTINENT HISTORY: R MCA infarct 06/03/2013 S/p tPA, HTN, DM2, Afib, had 1 seizure in 2017  PAIN:  Are you having  pain? No  PRECAUTIONS: Fall and Other: loop recorder  RED FLAGS: None   WEIGHT BEARING RESTRICTIONS: No  FALLS: Has patient fallen in last 6 months? Yes. Number of falls 3  LIVING ENVIRONMENT: Lives with: lives with their spouse Lives in: House/apartment Stairs: No Has following equipment at home: Single point cane  PLOF: Requires assistive device for independence  PATIENT GOALS: To work on reaction time and getting off the floor  OBJECTIVE:  Note: Objective measures were completed at Evaluation unless otherwise noted.  DIAGNOSTIC FINDINGS:  Brain MRI 09/20/2014 IMPRESSION: 1. No acute infarct. 2. Chronic right MCA territory infarct. 3. Subtle T2 hyperintensity in the left hippocampus and left temporal lobe cortex versus artifact. Recent seizure activity is a consideration.  COGNITION: Overall cognitive status: History of cognitive impairments - at baseline - reports quicker to anger/verbalize emotions/sometimes overstimulated with sounds and distractions   SENSATION: Light touch: WFL - LLE hypersensitive/question of allodynia  COORDINATION: Mildly dysmetric LLE  EDEMA:  None significant in BLE  MUSCLE TONE: LLE: Modifed Ashworth Scale 1+ = Slight increase in muscle tone, manifested by a catch, followed by minimal resistance throughout the remainder (less than half) of the ROM  POSTURE: rounded shoulders, flexed trunk , and weight shift right - mildly flexed trunk and right weight shift in standing and with ambulation, L shoulder more rounded than R w/ mild shoulder hike  LOWER EXTREMITY ROM:     Active  Right Eval Left Eval  Hip flexion WNL WFL  Hip extension    Hip abduction    Hip adduction    Hip internal rotation    Hip external rotation    Knee flexion    Knee extension    Ankle dorsiflexion  2+  Ankle plantarflexion    Ankle inversion    Ankle eversion     (Blank rows = not tested)  LOWER EXTREMITY MMT:    MMT Right Eval Left Eval  Hip  flexion    Hip extension    Hip abduction    Hip adduction    Hip internal rotation    Hip external rotation    Knee flexion    Knee extension    Ankle dorsiflexion    Ankle plantarflexion    Ankle inversion    Ankle eversion    (Blank rows = not tested)  BED MOBILITY:  Findings: Sit to supine Complete Independence Supine to sit Complete Independence Rolling to Right Complete Independence Rolling to Left Complete Independence - needs increased time and momentum for L and R Ind w/ features of tempurpedic style bed  TRANSFERS: Sit to stand: SBA  Assistive device utilized: Single point cane     Stand to sit: SBA  Assistive device utilized: Single point cane     Chair to chair: SBA  Assistive device utilized: Single point cane       RAMP:  Not tested  CURB:  Not tested  STAIRS: Not tested GAIT: Findings: Distance walked: various clinic distances and Comments: Pt has mild right reliance w/ SPC, no foot scuff/toe catch even without AFO, shortened stride and  slightly diminished flexion pattern of LLE  FUNCTIONAL TESTS:  5 times sit to stand: 18.89 sec w/ heavy right trunk shortening and RUE reliance w/ L toe down vs full weight bearing  PATIENT SURVEYS:  ABC scale: The Activities-Specific Balance Confidence (ABC) Scale TBA                                                                                                                              TREATMENT DATE: 06/17/2024 -ABC Scale:  85.625% -5xSTS:  17.81 sec w/ ongoing mild staggered stance and light BUE support  PATIENT EDUCATION: Education details:  Discussed AFO - partial carbon fiber w/ possible hinged ankle?  Discussed gait changes to expect w/ AFO and checking ankle skin for wounds as he adjusts to repositioning in more restrictive brace.  Discussed mechanics of gait w/ current ankle posture vs neutral position.  Scheduled remaining 2 visits for PT POC per pt request - explained OT re-cert overlaps but does not  align so no OT appts scheduled.  Continue HEP.  Process for re-cert and ongoing POC/goals.  Progress towards goals. Person educated: Patient Education method: Medical Illustrator Education comprehension: verbalized understanding and needs further education  HOME EXERCISE PROGRAM: Access Code: [facility] URL: [email or web address] Date: 03/21/2024 Prepared by: Daved Bull  Exercises - Seated Toe Raise  - 1-2 x daily - 4 x weekly - 2 sets - 10 reps - 2-3 seconds hold - Ankle Inversion Eversion Towel Slide  - 1-2 x daily - 4 x weekly - 2 sets - 10 reps - Seated Ankle Circles  - 1-2 x daily - 4 x weekly - 2 sets - 10 reps - Seated Heel Raise  - 1-2 x daily - 4 x weekly - 2 sets - 10 reps - 2-3 seconds hold - Seated Ankle Inversion Eversion PROM  - 1 x daily - 7 x weekly - 2 sets - 10 reps - Staggered Sit-to-Stand  - 1 x daily - 4-5 x weekly - 2-3 sets - 10 reps - Seated Hamstring Stretch  - 1 x daily - 4-5 x weekly - 1 sets - 3-4 reps - 45 seconds hold - Hooklying Single Knee to Chest Stretch with Towel  - 1 x daily - 4-5 x weekly - 1 sets - 3-4 reps - 45 seconds hold - Seated Toe Towel Scrunches  - 1 x daily - 4 x weekly - 1-2 sets - 10 reps - Seated Arch Lifts  - 1 x daily - 4 x weekly - 1-2 sets - 10 reps - Standing March with Unilateral Counter Support  - 1 x daily - 5 x weekly - 2 sets - 20 reps - Bird Dog on Counter  - 1 x daily - 5 x weekly - 2 sets - 10 reps - Step Sideways with Arms Reaching  - 1 x daily - 5 x weekly - 2 sets - 10 reps  Desensitization Techniques: -Light touch/pressure:  using  fingertip pressure to slowly move up small segments of the affected body part until outside the area of pain and then back to where you started -Deep pressure:  using fingertips to squeeze in small segments starting outside the affected area, crossing over the affected area, and then to the other side of the affected area -Tapping:  fingertips tap with firm pressure  over the affected area, can move around the affected area as well -Brushing/scratching:  use fingertips to brush/scratch over and around the affected area -Textures:  use soft and rough textured items like cotton balls vs wash cloths to brush or rub with light into firm pressure over and around the affected area  Repeat these 3-4x per day.   GOALS: Goals reviewed with patient? Yes  SHORT TERM GOALS: Target date: 04/08/2024  Pt will be independent and compliant with introductory strength and balance HEP in order to maintain functional progress and improve mobility. Baseline:  IND and compliant (11/26) Goal status: MET  2.  Pt will demonstrate adequate balance strategies to maintain upright with forward/overhead/and floor reaching. Baseline:  Very narrow limits of stability - several near falls related to this; ongoing (11/26) Goal status: IN PROGRESS  3.  Pt will be independent with desensitization strategies for LLE hypersensitivity management. Baseline: Provided - pt has handout and actively working on in OT/PT (11/26) Goal status: MET  LONG TERM GOALS: Target date: 05/06/2024  Pt will be independent and compliant with advanced and finalized strength and balance HEP in order to maintain functional progress and improve mobility. Baseline: To be established; pt feels he is doing moderately well with frequency of his HEP (12/4) Goal status: PARTIALLY MET  2.  Patient will improve ABC Scale score to >/=76% in order to demonstrate decreased risk for falls and increased confidence in balance. Baseline: 66.875% (10/14); 80% (12/4) Goal status: MET  3.  Patient will demonstrate floor recovery w/ no more than SBA in order to improve safety and home management. Baseline: Reports recent minA even w/ support surface; minA to flip from bottom to knees using support surface (12/4) Goal status: IN PROGRESS  4.  Pt will decrease 5xSTS to </=12 seconds w/ improved midline mechanics in order to  demonstrate decreased risk for falls and improved functional bilateral LE strength and power. Baseline: 18.89 sec w/ heavy right trunk shortening and RUE reliance w/ L toe down vs full weight bearing; 13.97 sec w/ light BUE support and mildly R rear staggered stance (12/4) Goal status: PARTIALLY MET  GOALS (at 87/5 re-cert): Goals reviewed with patient? Yes SHORT TERM GOALS = LONG TERM GOALS: Target date: 06/03/2024  Pt will be independent and compliant with advanced and finalized strength and balance HEP in order to maintain functional progress and improve mobility. Baseline: To be established; pt feels he is doing moderately well with frequency of his HEP (12/4) Goal status: ONGOING  2.  Patient will improve ABC Scale score to >/=90% in order to demonstrate decreased risk for falls and increased confidence in balance. Baseline: 66.875% (10/14); 80% (12/4); 85.625% (1/16) Goal status: IN PROGRESS  3.  Patient will demonstrate floor recovery w/ no more than SBA in order to improve safety and home management. Baseline: Reports recent minA even w/ support surface; minA to flip from bottom to knees using support surface (12/4) Goal status: ONGOING  4.  Pt will decrease 5xSTS to </=12 seconds w/ improved midline mechanics in order to demonstrate decreased risk for falls and improved functional bilateral LE strength  and power. Baseline: 18.89 sec w/ heavy right trunk shortening and RUE reliance w/ L toe down vs full weight bearing; 13.97 sec w/ light BUE support and mildly R rear staggered stance (12/4); 17.81 sec w/ ongoing mild staggered stance and light BUE support (1/16) Goal status: NOT MET  GOALS (at 8/83 re-cert): Goals reviewed with patient? Yes SHORT TERM GOALS: Target date: 07/15/2024  Pt will be independent and compliant with introductory strength and balance HEP in order to maintain functional progress and improve mobility. Baseline:  IND and compliant (11/26) Goal status:  INITIAL  LONG TERM GOALS: Target date: 08/12/2024  Pt will be independent and compliant with advanced and finalized strength and balance HEP in order to maintain functional progress and improve mobility. Baseline: To be established; pt feels he is doing moderately well with frequency of his HEP (12/4) Goal status: ONGOING  2.  Patient will improve ABC Scale score to >/=90% in order to demonstrate decreased risk for falls and increased confidence in balance. Baseline: 66.875% (10/14); 80% (12/4); 85.625% (1/16) Goal status: INITIAL  3.  Patient will demonstrate floor recovery w/ no more than SBA in order to improve safety and home management. Baseline: Reports recent minA even w/ support surface; minA to flip from bottom to knees using support surface (12/4) Goal status: ONGOING  4.  Pt will decrease 5xSTS to </=12 seconds w/ improved midline mechanics in order to demonstrate decreased risk for falls and improved functional bilateral LE strength and power. Baseline: 18.89 sec w/ heavy right trunk shortening and RUE reliance w/ L toe down vs full weight bearing; 13.97 sec w/ light BUE support and mildly R rear staggered stance (12/4); 17.81 sec w/ ongoing mild staggered stance and light BUE support (1/16) Goal status: ONGOING  5.  Pt will demonstrate safe gait mechanics with new custom AFO. Baseline: difficulty ambulating w/ OTS AFO prior - poor quality step to pattern Goal status: INITIAL  ASSESSMENT:  CLINICAL IMPRESSION: Pt has been out of clinic for significant portion of current recertification period.  Updated baselines this visit in accordance with policy.  His ABC Scale score improved by 5% to 85%.  He is set to receive a new custom AFO in the coming month and would benefit from further work to strengthen the LLE and improve gait mechanics with AFO.  His floor recovery also needs to be reviewed.  Continue per POC.  OBJECTIVE IMPAIRMENTS: Abnormal gait, decreased activity tolerance,  decreased balance, decreased cognition, decreased coordination, decreased endurance, decreased knowledge of use of DME, decreased strength, impaired sensation, impaired tone, improper body mechanics, and postural dysfunction.   ACTIVITY LIMITATIONS: carrying, lifting, bending, squatting, stairs, transfers, reach over head, and locomotion level  PARTICIPATION LIMITATIONS: laundry, shopping, and community activity  PERSONAL FACTORS: Age, Fitness, Past/current experiences, Time since onset of injury/illness/exacerbation, and 1-2 comorbidities: HTN, Afib are also affecting patient's functional outcome.   REHAB POTENTIAL: Good  CLINICAL DECISION MAKING: Stable/uncomplicated  EVALUATION COMPLEXITY: Moderate  PLAN:  PT FREQUENCY: 2x/week + 2x/wk + 1x/wk  PT DURATION: 8 weeks + 4 wks + 8 wks  PLANNED INTERVENTIONS: [id number]- PT Re-evaluation, 97750- Physical Performance Testing, 97110-Therapeutic exercises, 97530- Therapeutic activity, [id number]- Neuromuscular re-education, 97535- Self Care, [id number]- Manual therapy, [id number]- Gait training, [phone]- Orthotic Initial, [phone]- Orthotic/Prosthetic subsequent, [phone]- Aquatic Therapy, [phone]- Electrical stimulation (manual), Patient/Family education, Balance training, Stair training, Taping, Joint mobilization, Vestibular training, and DME instructions  PLAN FOR NEXT SESSION:  Floor recovery - incorporate tight space setup - repeat w/ full  transition to bottom and incorporate LLE use to recover to standing - pt wants to work towards no UE support if possible (did discuss limitations and safety recs 11/20).  LLE NMR/strength/reaction time.  Core strength - work mat table level on tasks to improve floor recovery from stomach and supine.  Reaching floor/forward/overhead. Perturbations/resisted gait/farmer's carry. Try ladder? Glut strengthening.  Righting reactions/outside BOS in sitting and standing.  Pt felt challenged w/: 4 elevated RLE for L SLS for mirror  squigz Wide STS Offset squats/mini lunges Hamstring pushbacks - strap to prevent squatting Wedge STS progress to no UE support Stairs/step taps/up; incline step through Standing 10lb kettlebell hip flexion LLE (isometric DF) Walking beanbag DF SLS taps to top step on L leg > L step ups Prolonged L stance 6 step w/ PT facilitating ankle posture Decline RLE step through working into lengthened stride 10 minutes on core at end of session - standing?  Pt declines offer of aquatic therapy at evaluation.  Daved KATHEE Bull, PT, DPT 06/17/2024, 2:51 PM        "

## 2024-06-23 ENCOUNTER — Ambulatory Visit: Admitting: Physical Therapy

## 2024-06-23 ENCOUNTER — Ambulatory Visit

## 2024-06-23 ENCOUNTER — Encounter: Payer: Self-pay | Admitting: Physical Therapy

## 2024-06-23 DIAGNOSIS — M6281 Muscle weakness (generalized): Secondary | ICD-10-CM

## 2024-06-23 DIAGNOSIS — R209 Unspecified disturbances of skin sensation: Secondary | ICD-10-CM

## 2024-06-23 DIAGNOSIS — I69354 Hemiplegia and hemiparesis following cerebral infarction affecting left non-dominant side: Secondary | ICD-10-CM

## 2024-06-23 DIAGNOSIS — R208 Other disturbances of skin sensation: Secondary | ICD-10-CM

## 2024-06-23 DIAGNOSIS — R278 Other lack of coordination: Secondary | ICD-10-CM

## 2024-06-23 DIAGNOSIS — R29818 Other symptoms and signs involving the nervous system: Secondary | ICD-10-CM

## 2024-06-23 DIAGNOSIS — R29898 Other symptoms and signs involving the musculoskeletal system: Secondary | ICD-10-CM

## 2024-06-23 DIAGNOSIS — R2681 Unsteadiness on feet: Secondary | ICD-10-CM

## 2024-06-23 NOTE — Therapy (Signed)
 " OUTPATIENT OCCUPATIONAL THERAPY NEURO TREATMENT   Patient Name: Donald [name] MRN: [id number] DOB:[birth year], 79 y.o., male Today's Date: 06/23/2024  PCP: Kip Righter, MD REFERRING PROVIDER: Sherryl Bouchard, NP  END OF SESSION:  OT End of Session - 06/23/24 1520     Visit Number 19    Number of Visits 24    Date for Recertification  07/29/24    Authorization Type MCR A&B/[employer]    Progress Note Due on Visit 20    OT Start Time 1537    OT Stop Time 1615    OT Time Calculation (min) 38 min    Activity Tolerance Patient tolerated treatment well    Behavior During Therapy WFL for tasks assessed/performed            Past Medical History:  Diagnosis Date   Diabetes mellitus without complication (HCC)    Dysrhythmia    Hyperlipemia    Hypertension    large right middle cerebral artery infarct, 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 s/p IV tPA, b. source unknown, c. loop recorder placed   Snoring 07/03/2015   Past Surgical History:  Procedure Laterality Date   LOOP RECORDER IMPLANT  06/07/2013   MDT LinQ implanted by Dr Kelsie for cryptogenic stroke   LOOP RECORDER IMPLANT N/A 06/07/2013   Procedure: LOOP RECORDER IMPLANT;  Surgeon: Lynwood JONETTA Kelsie, MD;  Location: [facility] CATH [facility];  Service: Cardiovascular;  Laterality: N/A;   TEE WITHOUT CARDIOVERSION N/A 06/07/2013   Procedure: TRANSESOPHAGEAL ECHOCARDIOGRAM (TEE);  Surgeon: Leim VEAR Moose, MD;  Location: [facility] ENDOSCOPY;  Service: Cardiovascular;  Laterality: N/A;   TONSILLECTOMY     TRANSANAL HEMORRHOIDAL DEARTERIALIZATION N/A 02/18/2023   Procedure: TRANSANAL HEMORRHOIDAL DEARTERIALIZATION;  Surgeon: Debby Hila, MD;  Location: [facility] ORS;  Service: General;  Laterality: N/A;   UMBILICAL HERNIA REPAIR     Patient Active Problem List   Diagnosis Date Noted   Permanent atrial fibrillation (HCC) 05/17/2019   Acquired thrombophilia 05/17/2019   Snoring 07/03/2015   Persistent atrial fibrillation (HCC) 04/16/2015   Hypokalemia  09/20/2014   HTN (hypertension) 09/19/2014   HLD (hyperlipidemia) 09/19/2014   Diabetes mellitus without complication (HCC) 09/19/2014   Jerking 09/19/2014   Alterations of sensations, late effect of cerebrovascular disease 11/07/2013   Disturbances of vision, late effect of cerebrovascular disease 11/07/2013   Sinus bradycardia 08/08/2013   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HCC) 07/26/2013   Left-sided neglect 07/26/2013   Small vessel disease 06/07/2013   Obesity, unspecified 06/07/2013   Cerebral infarction (HCC) 06/07/2013   large right middle cerebral artery infarct, 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   Hypertension 06/03/2013   Diabetes (HCC) 06/03/2013   ONSET DATE: 06/03/2013 (CVA), 03/15/2024 referral date  REFERRING DIAG: P30.645 (ICD-10-CM) - Hemiplegia and hemiparesis following cerebral infarction affecting left non-dominant side (HCC)  THERAPY DIAG:  Other lack of coordination  Other symptoms and signs involving the nervous system  Hemiplegia and hemiparesis following cerebral infarction affecting left non-dominant side (HCC)  Other symptoms and signs involving the musculoskeletal system  Muscle weakness (generalized)  Other disturbances of skin sensation  Rationale for Evaluation and Treatment: Rehabilitation  SUBJECTIVE:   SUBJECTIVE STATEMENT: Pt reported a good PT session.  Pt accompanied by: self  PERTINENT HISTORY: R MCA infarct 06/03/2013 S/p tPA, HTN, DM2, Afib, had 1 seizure in 2017   DIAGNOSTIC FINDINGS:  Brain MRI 09/20/2014 IMPRESSION: 1. No acute infarct. 2. Chronic right MCA territory infarct. 3. Subtle T2 hyperintensity in the left hippocampus and left temporal lobe cortex versus artifact.  Recent seizure activity is a consideration  PRECAUTIONS: Fall and Other: loop recorder, L sided weakness  WEIGHT BEARING RESTRICTIONS: No  PAIN:  Are you having pain? No  FALLS: Has patient fallen in last 6 months? Yes. Number of falls 4 in the last  year  LIVING ENVIRONMENT: Lives with: lives with their spouse Lives in: House/apartment 1 level Stairs: No Has following equipment at home: Single point cane, shower chair/bench with back, walk in shower  PLOF: Requires assistive device for independence  PATIENT GOALS: I'd like to be able to use my left hand more functionally. I want to improve my coordination and reaction time.  OBJECTIVE:  Note: Objective measures were completed at Evaluation unless otherwise noted.  HAND DOMINANCE: Right  ADLs: Overall ADLs: Independent  Equipment: Shower seat with back and Walk in shower  IADLs: Cooks and cleans bathrooms and kitchen Shopping:  Light housekeeping: Independent Meal Prep: Independent Community mobility: Wife completes  Medication management: Independent Landscape architect: Independent Handwriting: did not assess d/t nondominant L hand being affected  MOBILITY STATUS: ambulates with SPC  POSTURE COMMENTS:  rounded shoulders, flexed trunk , and weight shift right - mildly flexed trunk and right weight shift in standing and with ambulation, L shoulder more rounded than R w/ mild shoulder hike   ACTIVITY TOLERANCE: Activity tolerance: no changes  FUNCTIONAL OUTCOME MEASURES: PSFS: 05/05/2024:  3.7 total score 06/03/2024:   UPPER EXTREMITY ROM:  WNL  UPPER EXTREMITY MMT:   4-/5 shoulder   HAND FUNCTION: Grip strength: Right: 73.1 lbs; Left: 58.2 lbs 06/03/2024:  Lateral pinch: Right: 22 lbs, Left: 20 lbs 3 point pinch: Right: 15 lbs, Left: 11 lbs Tip pinch: Right 14 lbs, Left: 10 lbs  COORDINATION: 9 Hole Peg test: Right: 26.32 sec; Left: 65.08 sec Box and Blocks:  Right 49 blocks, Left 29 blocks  SENSATION: Reports hypersensitivity in fingertips  EDEMA: Pt reports hx of edema but it is rare now.  MUSCLE TONE: LUE: Modifed Ashworth Scale 1 = Slight increase in muscle tone, manifested by a catch and release or by minimal resistance at the end of the range of  motion when the affected part(s) is moved in flexion or extension  COGNITION: Overall cognitive status: Within functional limits for tasks assessed  VISION: Subjective report: Pt reports having small cataracts but it is not affecting vision at this time Baseline vision: Wears glasses all the time Visual history: cataracts well kept  VISION ASSESSMENT: To be further assessed in functional context  PERCEPTION: WFL  PRAXIS: WFL  OBSERVATIONS: Impaired FM coordination, grip strength, strength in L hand and shoulder, hypersensitivity in fingertips of L hand                                                                                                                           TREATMENT:   - Therapeutic activities completed for duration as noted below including:  Assessed Blaze pods reaction time with L shoulder in flexion, scaption,  and abduction. Average reaction time over 3 rounds 792. Utilized velcro board to complete tip to tip, 3 point, and key pinch of L hand, and utilized soft and spiky side of velcro for desensitization purposes of L fingertips. Utilized squigs for gross grip strengthening of L hand.  - Therapeutic exercises completed for duration as noted below including: Reviewed prone and supine exercises for shoulder stability.  PATIENT EDUCATION: Education details: see above Person educated: Patient Education method: Explanation, Demonstration, Tactile cues, and Verbal cues Education comprehension: verbalized understanding, returned demonstration, verbal cues required, and tactile cues required  HOME EXERCISE PROGRAM: 04/04/24: Desensitization 04/07/24: Putty, coordination 04/14/24: Golf solitaire 11.17.25: Access Code: [email or web address] Theraband 06/09/2024: shoulder stability   GOALS: Goals reviewed with patient? Yes  LONG TERM GOALS: Target date: 07/29/24  Pt will demonstrate an increase of at least 2.0 score of average PSFS score or a 3 point increase in individual  activity score in PSFS Baseline: 1.0 05/05/2024: 3.7 total score (see above for individual activity scores) 06/03/2024: 4.0 total score Goal status:  MET  2.  Pt will demonstrate improved L hand FM coordination by a score of 60 seconds or less in 9HPT Baseline: Right: 26.32 sec; Left: 65.08 sec 05/05/2024: 59 seconds Goal status:  MET  3.  Pt will demonstrate improved grip strength in L hand by scoring at least 65 pounds Baseline: Right: 73.1 lbs; Left: 58.2 lbs 05/05/2024: 58.2 lbs 05/23/24: 48.4 lbs  06/03/2024: 56.6, 51.3, 48 lbs Goal status:  NO CHANGE  4.  Pt will demonstrate improved L coordination by a score of at least 39 blocks on Box and Blocks test Baseline: 29 blocks 05/05/2024: 36 blocks 05/23/24: 33 blocks 06/03/2024: 40 blocks Goal status:  MET  5.  Pt will report improvement with picking up items using the L hand with less smarting. Baseline:  Goal status: INITIAL  6.  Patient will demonstrate at least 3 lb improvement with L 3 point and tip pinch strengths as needed to assist with ADLs, IADLs, and leisure activities.  Baseline: 3 point pinch: Right: 15 lbs, Left: 11 lbs Tip pinch: Right 14 lbs, Left: 10 lbs Goal status: INITIAL  7.  Pt will improve Blazepod reaction time with L shoulder in flexion and abduction using isolated index finger indicating improved shoulder stability and gross motor coordination. Baseline: TBA 06/23/24: 795 average time Goal status: INITIAL   ASSESSMENT:  CLINICAL IMPRESSION: Pt seen for OT today secondary to impaired functional use of LUE following chronic CVA. Pt demonstrated good form of prone/supine exercises.  Pt participating well with shoulder stability activities and pinch activities. Pt would benefit from continued skilled OT services to work on new LTGs further until all goals met or until max rehab potential is reached.  PERFORMANCE DEFICITS: in functional skills including IADLs, coordination, dexterity, sensation, strength,  Fine motor control, Gross motor control, decreased knowledge of precautions, and UE functional use, cognitive skills including safety awareness, and psychosocial skills including coping strategies and environmental adaptation.   IMPAIRMENTS: are limiting patient from IADLs, work, and leisure.   CO-MORBIDITIES: may have co-morbidities  that affects occupational performance. Patient will benefit from skilled OT to address above impairments and improve overall function  REHAB POTENTIAL: Good  PLAN:  OT FREQUENCY: Additional 1x/week  OT DURATION: Additional  8 weeks  PLANNED INTERVENTIONS: 97168 OT Re-evaluation, 97535 self care/ADL training, 02889 therapeutic exercise, 97530 therapeutic activity, 97112 neuromuscular re-education, passive range of motion, psychosocial skills training, coping strategies training, patient/family education, and DME and/or  AE instructions  RECOMMENDED OTHER SERVICES: none at this time  CONSULTED AND AGREED WITH PLAN OF CARE: Patient  PLAN FOR NEXT SESSION: Progress note, assess goals  Go to mat -Review theraband  pinch strengthening; shoulder stability/strengthening  Reaction time   Rocky Dutch, OT 06/23/2024, 4:18 PM           "

## 2024-06-23 NOTE — Therapy (Signed)
 " OUTPATIENT PHYSICAL THERAPY NEURO TREATMENT   Patient Name: Donald [name] MRN: [id number] DOB:February 03, 1946, 79 y.o., male Today's Date: 06/23/2024   PCP: Donald Righter, MD REFERRING PROVIDER: Sherryl Bouchard, NP  END OF SESSION:  PT End of Session - 06/23/24 1457     Visit Number 24    Number of Visits 33   25 + 8   Date for Recertification  08/19/24   pushed out due to multi-D scheduling needs   Authorization Type Medicare A&B w/ [employer] supplement    Progress Note Due on Visit 30    PT Start Time 1450    PT Stop Time 1535    PT Time Calculation (min) 45 min    Equipment Utilized During Treatment Gait belt    Activity Tolerance Patient tolerated treatment well    Behavior During Therapy WFL for tasks assessed/performed          Past Medical History:  Diagnosis Date   Diabetes mellitus without complication (HCC)    Dysrhythmia    Hyperlipemia    Hypertension    large right middle cerebral artery infarct, 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 s/p IV tPA, b. source unknown, c. loop recorder placed   Snoring 07/03/2015   Past Surgical History:  Procedure Laterality Date   LOOP RECORDER IMPLANT  06/07/2013   MDT LinQ implanted by Dr Donald [name] for cryptogenic stroke   LOOP RECORDER IMPLANT N/A 06/07/2013   Procedure: LOOP RECORDER IMPLANT;  Surgeon: Donald JONETTA Kelsie, MD;  Location: [facility] CATH LAB;  Service: Cardiovascular;  Laterality: N/A;   TEE WITHOUT CARDIOVERSION N/A 06/07/2013   Procedure: TRANSESOPHAGEAL ECHOCARDIOGRAM (TEE);  Surgeon: Donald VEAR Moose, MD;  Location: [facility] ENDOSCOPY;  Service: Cardiovascular;  Laterality: N/A;   TONSILLECTOMY     TRANSANAL HEMORRHOIDAL DEARTERIALIZATION N/A 02/18/2023   Procedure: TRANSANAL HEMORRHOIDAL DEARTERIALIZATION;  Surgeon: Donald Hila, MD;  Location: [facility] ORS;  Service: General;  Laterality: N/A;   UMBILICAL HERNIA REPAIR     Patient Active Problem List   Diagnosis Date Noted   Permanent atrial fibrillation (HCC) 05/17/2019    Acquired thrombophilia 05/17/2019   Snoring 07/03/2015   Persistent atrial fibrillation (HCC) 04/16/2015   Hypokalemia 09/20/2014   HTN (hypertension) 09/19/2014   HLD (hyperlipidemia) 09/19/2014   Diabetes mellitus without complication (HCC) 09/19/2014   Jerking 09/19/2014   Alterations of sensations, late effect of cerebrovascular disease 11/07/2013   Disturbances of vision, late effect of cerebrovascular disease 11/07/2013   Sinus bradycardia 08/08/2013   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HCC) 07/26/2013   Left-sided neglect 07/26/2013   Small vessel disease 06/07/2013   Obesity, unspecified 06/07/2013   Cerebral infarction (HCC) 06/07/2013   large right middle cerebral artery infarct, 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   Hypertension 06/03/2013   Diabetes (HCC) 06/03/2013    ONSET DATE: 06/03/2013 (CVA)  REFERRING DIAG: P30.645 (ICD-10-CM) - Hemiparesis affecting left side as late effect of cerebrovascular accident (HCC)  THERAPY DIAG:  Muscle weakness (generalized)  Other lack of coordination  Other symptoms and signs involving the nervous system  Other symptoms and signs involving the musculoskeletal system  Other disturbances of skin sensation  Hemiplegia and hemiparesis following cerebral infarction affecting left non-dominant side (HCC)  Unsteadiness on feet  Unspecified disturbances of skin sensation  Rationale for Evaluation and Treatment: Rehabilitation  SUBJECTIVE:  SUBJECTIVE STATEMENT: Donald [name]  He reports no falls and no pain.  Still ambulating w/ SPC.    Pt accompanied by: self (Wife - waiting in car)  PERTINENT HISTORY: R MCA infarct 06/03/2013 S/p tPA, HTN, DM2, Afib, had 1 seizure in 2017  PAIN:  Are you having pain? No  PRECAUTIONS: Fall and Other: loop  recorder  RED FLAGS: None   WEIGHT BEARING RESTRICTIONS: No  FALLS: Has patient fallen in last 6 months? Yes. Number of falls 3  LIVING ENVIRONMENT: Lives with: lives with their spouse Lives in: House/apartment Stairs: No Has following equipment at home: Single point cane  PLOF: Requires assistive device for independence  PATIENT GOALS: To work on reaction time and getting off the floor  OBJECTIVE:  Note: Objective measures were completed at Evaluation unless otherwise noted.  DIAGNOSTIC FINDINGS:  Brain MRI 09/20/2014 IMPRESSION: 1. No acute infarct. 2. Chronic right MCA territory infarct. 3. Subtle T2 hyperintensity in the left hippocampus and left temporal lobe cortex versus artifact. Recent seizure activity is a consideration.  COGNITION: Overall cognitive status: History of cognitive impairments - at baseline - reports quicker to anger/verbalize emotions/sometimes overstimulated with sounds and distractions   SENSATION: Light touch: WFL - LLE hypersensitive/question of allodynia  COORDINATION: Mildly dysmetric LLE  EDEMA:  None significant in BLE  MUSCLE TONE: LLE: Modifed Ashworth Scale 1+ = Slight increase in muscle tone, manifested by a catch, followed by minimal resistance throughout the remainder (less than half) of the ROM  POSTURE: rounded shoulders, flexed trunk , and weight shift right - mildly flexed trunk and right weight shift in standing and with ambulation, L shoulder more rounded than R w/ mild shoulder hike  LOWER EXTREMITY ROM:     Active  Right Eval Left Eval  Hip flexion WNL WFL  Hip extension    Hip abduction    Hip adduction    Hip internal rotation    Hip external rotation    Knee flexion    Knee extension    Ankle dorsiflexion  2+  Ankle plantarflexion    Ankle inversion    Ankle eversion     (Blank rows = not tested)  LOWER EXTREMITY MMT:    MMT Right Eval Left Eval  Hip flexion    Hip extension    Hip abduction     Hip adduction    Hip internal rotation    Hip external rotation    Knee flexion    Knee extension    Ankle dorsiflexion    Ankle plantarflexion    Ankle inversion    Ankle eversion    (Blank rows = not tested)  BED MOBILITY:  Findings: Sit to supine Complete Independence Supine to sit Complete Independence Rolling to Right Complete Independence Rolling to Left Complete Independence - needs increased time and momentum for L and R Ind w/ features of tempurpedic style bed  TRANSFERS: Sit to stand: SBA  Assistive device utilized: Single point cane     Stand to sit: SBA  Assistive device utilized: Single point cane     Chair to chair: SBA  Assistive device utilized: Single point cane       RAMP:  Not tested  CURB:  Not tested  STAIRS: Not tested GAIT: Findings: Distance walked: various clinic distances and Comments: Pt has mild right reliance w/ SPC, no foot scuff/toe catch even without AFO, shortened stride and slightly diminished flexion pattern of LLE  FUNCTIONAL TESTS:  5 times sit to stand: 18.89  sec w/ heavy right trunk shortening and RUE reliance w/ L toe down vs full weight bearing  PATIENT SURVEYS:  ABC scale: The Activities-Specific Balance Confidence (ABC) Scale TBA                                                                                                                              TREATMENT DATE: 06/23/2024 Peanut ball supported recline:  -Reclined shoulder protraction 2x10 w/ 2lb dowel  -Midline reclined crunch w/ 2lb dowel 2x12 > L and R focused crunch 2x12  -Reclined chest press w/ 2lb dowel x15   -Side heel taps sitting EOM x20 -Standing lateral crunches (to knee) x20  PATIENT EDUCATION: Education details:  Continue HEP.   Person educated: Patient Education method: Medical Illustrator Education comprehension: verbalized understanding and needs further education  HOME EXERCISE PROGRAM: Access Code: [facility] URL:  [email or web address] Date: 03/21/2024 Prepared by: Daved Bull  Exercises - Seated Toe Raise  - 1-2 x daily - 4 x weekly - 2 sets - 10 reps - 2-3 seconds hold - Ankle Inversion Eversion Towel Slide  - 1-2 x daily - 4 x weekly - 2 sets - 10 reps - Seated Ankle Circles  - 1-2 x daily - 4 x weekly - 2 sets - 10 reps - Seated Heel Raise  - 1-2 x daily - 4 x weekly - 2 sets - 10 reps - 2-3 seconds hold - Seated Ankle Inversion Eversion PROM  - 1 x daily - 7 x weekly - 2 sets - 10 reps - Staggered Sit-to-Stand  - 1 x daily - 4-5 x weekly - 2-3 sets - 10 reps - Seated Hamstring Stretch  - 1 x daily - 4-5 x weekly - 1 sets - 3-4 reps - 45 seconds hold - Hooklying Single Knee to Chest Stretch with Towel  - 1 x daily - 4-5 x weekly - 1 sets - 3-4 reps - 45 seconds hold - Seated Toe Towel Scrunches  - 1 x daily - 4 x weekly - 1-2 sets - 10 reps - Seated Arch Lifts  - 1 x daily - 4 x weekly - 1-2 sets - 10 reps - Standing March with Unilateral Counter Support  - 1 x daily - 5 x weekly - 2 sets - 20 reps - Bird Dog on Counter  - 1 x daily - 5 x weekly - 2 sets - 10 reps - Step Sideways with Arms Reaching  - 1 x daily - 5 x weekly - 2 sets - 10 reps  Desensitization Techniques: -Light touch/pressure:  using fingertip pressure to slowly move up small segments of the affected body part until outside the area of pain and then back to where you started -Deep pressure:  using fingertips to squeeze in small segments starting outside the affected area, crossing over the affected area, and then to the other side of the affected area -Tapping:  fingertips tap with firm pressure over  the affected area, can move around the affected area as well -Brushing/scratching:  use fingertips to brush/scratch over and around the affected area -Textures:  use soft and rough textured items like cotton balls vs wash cloths to brush or rub with light into firm pressure over and around the affected  area  Repeat these 3-4x per day.   GOALS: Goals reviewed with patient? Yes  SHORT TERM GOALS: Target date: 04/08/2024  Pt will be independent and compliant with introductory strength and balance HEP in order to maintain functional progress and improve mobility. Baseline:  IND and compliant (11/26) Goal status: MET  2.  Pt will demonstrate adequate balance strategies to maintain upright with forward/overhead/and floor reaching. Baseline:  Very narrow limits of stability - several near falls related to this; ongoing (11/26) Goal status: IN PROGRESS  3.  Pt will be independent with desensitization strategies for LLE hypersensitivity management. Baseline: Provided - pt has handout and actively working on in OT/PT (11/26) Goal status: MET  LONG TERM GOALS: Target date: 05/06/2024  Pt will be independent and compliant with advanced and finalized strength and balance HEP in order to maintain functional progress and improve mobility. Baseline: To be established; pt feels he is doing moderately well with frequency of his HEP (12/4) Goal status: PARTIALLY MET  2.  Patient will improve ABC Scale score to >/=76% in order to demonstrate decreased risk for falls and increased confidence in balance. Baseline: 66.875% (10/14); 80% (12/4) Goal status: MET  3.  Patient will demonstrate floor recovery w/ no more than SBA in order to improve safety and home management. Baseline: Reports recent minA even w/ support surface; minA to flip from bottom to knees using support surface (12/4) Goal status: IN PROGRESS  4.  Pt will decrease 5xSTS to </=12 seconds w/ improved midline mechanics in order to demonstrate decreased risk for falls and improved functional bilateral LE strength and power. Baseline: 18.89 sec w/ heavy right trunk shortening and RUE reliance w/ L toe down vs full weight bearing; 13.97 sec w/ light BUE support and mildly R rear staggered stance (12/4) Goal status: PARTIALLY MET  GOALS  (at 87/5 re-cert): Goals reviewed with patient? Yes SHORT TERM GOALS = LONG TERM GOALS: Target date: 06/03/2024  Pt will be independent and compliant with advanced and finalized strength and balance HEP in order to maintain functional progress and improve mobility. Baseline: To be established; pt feels he is doing moderately well with frequency of his HEP (12/4) Goal status: ONGOING  2.  Patient will improve ABC Scale score to >/=90% in order to demonstrate decreased risk for falls and increased confidence in balance. Baseline: 66.875% (10/14); 80% (12/4); 85.625% (1/16) Goal status: IN PROGRESS  3.  Patient will demonstrate floor recovery w/ no more than SBA in order to improve safety and home management. Baseline: Reports recent minA even w/ support surface; minA to flip from bottom to knees using support surface (12/4) Goal status: ONGOING  4.  Pt will decrease 5xSTS to </=12 seconds w/ improved midline mechanics in order to demonstrate decreased risk for falls and improved functional bilateral LE strength and power. Baseline: 18.89 sec w/ heavy right trunk shortening and RUE reliance w/ L toe down vs full weight bearing; 13.97 sec w/ light BUE support and mildly R rear staggered stance (12/4); 17.81 sec w/ ongoing mild staggered stance and light BUE support (1/16) Goal status: NOT MET  GOALS (at 8/83 re-cert): Goals reviewed with patient? Yes SHORT TERM GOALS: Target  date: 07/15/2024  Pt will be independent and compliant with introductory strength and balance HEP in order to maintain functional progress and improve mobility. Baseline:  IND and compliant (11/26) Goal status: INITIAL  LONG TERM GOALS: Target date: 08/12/2024  Pt will be independent and compliant with advanced and finalized strength and balance HEP in order to maintain functional progress and improve mobility. Baseline: To be established; pt feels he is doing moderately well with frequency of his HEP (12/4) Goal status:  ONGOING  2.  Patient will improve ABC Scale score to >/=90% in order to demonstrate decreased risk for falls and increased confidence in balance. Baseline: 66.875% (10/14); 80% (12/4); 85.625% (1/16) Goal status: INITIAL  3.  Patient will demonstrate floor recovery w/ no more than SBA in order to improve safety and home management. Baseline: Reports recent minA even w/ support surface; minA to flip from bottom to knees using support surface (12/4) Goal status: ONGOING  4.  Pt will decrease 5xSTS to </=12 seconds w/ improved midline mechanics in order to demonstrate decreased risk for falls and improved functional bilateral LE strength and power. Baseline: 18.89 sec w/ heavy right trunk shortening and RUE reliance w/ L toe down vs full weight bearing; 13.97 sec w/ light BUE support and mildly R rear staggered stance (12/4); 17.81 sec w/ ongoing mild staggered stance and light BUE support (1/16) Goal status: ONGOING  5.  Pt will demonstrate safe gait mechanics with new custom AFO. Baseline: difficulty ambulating w/ OTS AFO prior - poor quality step to pattern Goal status: INITIAL  ASSESSMENT:  CLINICAL IMPRESSION: Focus of skilled PT session today on core engagement in sitting and standing.  He does well with increased challenge and demonstrates good breathing pattern and no strain despite this.  PT to continue addressing standing balance and gait tolerance with AFO once pt receives.  In the interim, he benefits from work on activity tolerance, desensitization of L hemibody and general mobility to decrease fall risk.  Will continue to address all other deficits outlined in ongoing skilled PT POC.  OBJECTIVE IMPAIRMENTS: Abnormal gait, decreased activity tolerance, decreased balance, decreased cognition, decreased coordination, decreased endurance, decreased knowledge of use of DME, decreased strength, impaired sensation, impaired tone, improper body mechanics, and postural dysfunction.    ACTIVITY LIMITATIONS: carrying, lifting, bending, squatting, stairs, transfers, reach over head, and locomotion level  PARTICIPATION LIMITATIONS: laundry, shopping, and community activity  PERSONAL FACTORS: Age, Fitness, Past/current experiences, Time since onset of injury/illness/exacerbation, and 1-2 comorbidities: HTN, Afib are also affecting patient's functional outcome.   REHAB POTENTIAL: Good  CLINICAL DECISION MAKING: Stable/uncomplicated  EVALUATION COMPLEXITY: Moderate  PLAN:  PT FREQUENCY: 2x/week + 2x/wk + 1x/wk  PT DURATION: 8 weeks + 4 wks + 8 wks  PLANNED INTERVENTIONS: [id number]- PT Re-evaluation, 97750- Physical Performance Testing, 97110-Therapeutic exercises, 97530- Therapeutic activity, [id number]- Neuromuscular re-education, 97535- Self Care, [id number]- Manual therapy, [phone]- Gait training, [phone]- Orthotic Initial, [phone]- Orthotic/Prosthetic subsequent, [phone]- Aquatic Therapy, [phone]- Electrical stimulation (manual), Patient/Family education, Balance training, Stair training, Taping, Joint mobilization, Vestibular training, and DME instructions  PLAN FOR NEXT SESSION:  Floor recovery - incorporate tight space setup - repeat w/ full transition to bottom and incorporate LLE use to recover to standing - pt wants to work towards no UE support if possible (did discuss limitations and safety recs 11/20).  LLE NMR/strength/reaction time.  Core strength - work mat table level on tasks to improve floor recovery from stomach and supine.  Reaching floor/forward/overhead. Perturbations/resisted gait/farmer's carry. Try  ladder? Glut strengthening.  Righting reactions/outside BOS in sitting and standing.  Pt felt challenged w/: 4 elevated RLE for L SLS for mirror squigz Wide STS Offset squats/mini lunges Hamstring pushbacks - strap to prevent squatting Wedge STS progress to no UE support Stairs/step taps/up; incline step through Standing 10lb kettlebell hip flexion LLE (isometric  DF) Walking beanbag DF SLS taps to top step on L leg > L step ups Prolonged L stance 6 step w/ PT facilitating ankle posture Decline RLE step through working into lengthened stride 10 minutes on core at end of session - standing?  Pt declines offer of aquatic therapy at evaluation.  Daved KATHEE Bull, PT, DPT 06/23/2024, 3:40 PM        "

## 2024-06-24 ENCOUNTER — Ambulatory Visit

## 2024-06-29 ENCOUNTER — Ambulatory Visit: Payer: Medicare Other | Admitting: Adult Health

## 2024-06-30 ENCOUNTER — Ambulatory Visit: Admitting: Physical Therapy

## 2024-06-30 ENCOUNTER — Ambulatory Visit

## 2024-07-01 ENCOUNTER — Ambulatory Visit

## 2024-07-04 ENCOUNTER — Ambulatory Visit: Admitting: Physical Therapy

## 2024-07-04 ENCOUNTER — Ambulatory Visit

## 2024-07-05 ENCOUNTER — Ambulatory Visit: Admitting: Neurology

## 2024-07-06 ENCOUNTER — Ambulatory Visit: Admitting: Physical Therapy

## 2024-07-06 ENCOUNTER — Telehealth: Payer: Self-pay | Admitting: Adult Health

## 2024-07-06 ENCOUNTER — Ambulatory Visit: Admitting: Occupational Therapy

## 2024-07-06 NOTE — Telephone Encounter (Signed)
 Patient's wife called at 10:11 am on 07/05/24 to cancel appointment due to snow in in driveway and do not want to take chance he getting her

## 2024-07-08 ENCOUNTER — Encounter: Payer: Self-pay | Admitting: Physical Therapy

## 2024-07-08 ENCOUNTER — Ambulatory Visit: Admitting: Physical Therapy

## 2024-07-08 ENCOUNTER — Ambulatory Visit

## 2024-07-08 DIAGNOSIS — M6281 Muscle weakness (generalized): Secondary | ICD-10-CM

## 2024-07-08 DIAGNOSIS — R278 Other lack of coordination: Secondary | ICD-10-CM

## 2024-07-08 DIAGNOSIS — R29818 Other symptoms and signs involving the nervous system: Secondary | ICD-10-CM

## 2024-07-08 DIAGNOSIS — I69354 Hemiplegia and hemiparesis following cerebral infarction affecting left non-dominant side: Secondary | ICD-10-CM

## 2024-07-08 DIAGNOSIS — R2681 Unsteadiness on feet: Secondary | ICD-10-CM

## 2024-07-08 DIAGNOSIS — R209 Unspecified disturbances of skin sensation: Secondary | ICD-10-CM

## 2024-07-08 DIAGNOSIS — R29898 Other symptoms and signs involving the musculoskeletal system: Secondary | ICD-10-CM

## 2024-07-08 DIAGNOSIS — R208 Other disturbances of skin sensation: Secondary | ICD-10-CM

## 2024-07-08 NOTE — Therapy (Signed)
 " OUTPATIENT PHYSICAL THERAPY NEURO TREATMENT   Patient Name: Donald [name] MRN: [id number] DOB:10/04/1945, 79 y.o., male Today's Date: 07/08/2024   PCP: Kip Righter, MD REFERRING PROVIDER: Sherryl Bouchard, NP  END OF SESSION:  PT End of Session - 07/08/24 1531     Visit Number 25    Number of Visits 33   25 + 8   Date for Recertification  08/19/24   pushed out due to multi-D scheduling needs   Authorization Type Medicare A&B w/ [employer] supplement    Progress Note Due on Visit 30    PT Start Time 1530    PT Stop Time 1614    PT Time Calculation (min) 44 min    Equipment Utilized During Treatment Gait belt    Activity Tolerance Patient tolerated treatment well    Behavior During Therapy WFL for tasks assessed/performed          Past Medical History:  Diagnosis Date   Diabetes mellitus without complication (HCC)    Dysrhythmia    Hyperlipemia    Hypertension    large right middle cerebral artery infarct, 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 s/p IV tPA, b. source unknown, c. loop recorder placed   Snoring 07/03/2015   Past Surgical History:  Procedure Laterality Date   LOOP RECORDER IMPLANT  06/07/2013   MDT LinQ implanted by Dr Kelsie for cryptogenic stroke   LOOP RECORDER IMPLANT N/A 06/07/2013   Procedure: LOOP RECORDER IMPLANT;  Surgeon: Lynwood JONETTA Kelsie, MD;  Location: [facility] CATH LAB;  Service: Cardiovascular;  Laterality: N/A;   TEE WITHOUT CARDIOVERSION N/A 06/07/2013   Procedure: TRANSESOPHAGEAL ECHOCARDIOGRAM (TEE);  Surgeon: Leim VEAR Moose, MD;  Location: [facility] ENDOSCOPY;  Service: Cardiovascular;  Laterality: N/A;   TONSILLECTOMY     TRANSANAL HEMORRHOIDAL DEARTERIALIZATION N/A 02/18/2023   Procedure: TRANSANAL HEMORRHOIDAL DEARTERIALIZATION;  Surgeon: Debby Hila, MD;  Location: [facility] ORS;  Service: General;  Laterality: N/A;   UMBILICAL HERNIA REPAIR     Patient Active Problem List   Diagnosis Date Noted   Permanent atrial fibrillation (HCC) 05/17/2019    Acquired thrombophilia 05/17/2019   Snoring 07/03/2015   Persistent atrial fibrillation (HCC) 04/16/2015   Hypokalemia 09/20/2014   HTN (hypertension) 09/19/2014   HLD (hyperlipidemia) 09/19/2014   Diabetes mellitus without complication (HCC) 09/19/2014   Jerking 09/19/2014   Alterations of sensations, late effect of cerebrovascular disease 11/07/2013   Disturbances of vision, late effect of cerebrovascular disease 11/07/2013   Sinus bradycardia 08/08/2013   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HCC) 07/26/2013   Left-sided neglect 07/26/2013   Small vessel disease 06/07/2013   Obesity, unspecified 06/07/2013   Cerebral infarction (HCC) 06/07/2013   large right middle cerebral artery infarct, 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   Hypertension 06/03/2013   Diabetes (HCC) 06/03/2013    ONSET DATE: 06/03/2013 (CVA)  REFERRING DIAG: P30.645 (ICD-10-CM) - Hemiparesis affecting left side as late effect of cerebrovascular accident (HCC)  THERAPY DIAG:  Muscle weakness (generalized)  Other lack of coordination  Other symptoms and signs involving the nervous system  Other symptoms and signs involving the musculoskeletal system  Other disturbances of skin sensation  Hemiplegia and hemiparesis following cerebral infarction affecting left non-dominant side (HCC)  Unsteadiness on feet  Unspecified disturbances of skin sensation  Rationale for Evaluation and Treatment: Rehabilitation  SUBJECTIVE:  SUBJECTIVE STATEMENT: Donald [name]  He reports no falls and no pain.  Still ambulating w/ SPC.  He has been focusing heavily on his HEP since being stuck in the house due to the icy weather.  Pt accompanied by: self (Wife - waiting in car)  PERTINENT HISTORY: R MCA infarct 06/03/2013 S/p tPA, HTN, DM2, Afib, had 1  seizure in 2017  PAIN:  Are you having pain? No  PRECAUTIONS: Fall and Other: loop recorder  RED FLAGS: None   WEIGHT BEARING RESTRICTIONS: No  FALLS: Has patient fallen in last 6 months? Yes. Number of falls 3  LIVING ENVIRONMENT: Lives with: lives with their spouse Lives in: House/apartment Stairs: No Has following equipment at home: Single point cane  PLOF: Requires assistive device for independence  PATIENT GOALS: To work on reaction time and getting off the floor  OBJECTIVE:  Note: Objective measures were completed at Evaluation unless otherwise noted.  DIAGNOSTIC FINDINGS:  Brain MRI 09/20/2014 IMPRESSION: 1. No acute infarct. 2. Chronic right MCA territory infarct. 3. Subtle T2 hyperintensity in the left hippocampus and left temporal lobe cortex versus artifact. Recent seizure activity is a consideration.  COGNITION: Overall cognitive status: History of cognitive impairments - at baseline - reports quicker to anger/verbalize emotions/sometimes overstimulated with sounds and distractions   SENSATION: Light touch: WFL - LLE hypersensitive/question of allodynia  COORDINATION: Mildly dysmetric LLE  EDEMA:  None significant in BLE  MUSCLE TONE: LLE: Modifed Ashworth Scale 1+ = Slight increase in muscle tone, manifested by a catch, followed by minimal resistance throughout the remainder (less than half) of the ROM  POSTURE: rounded shoulders, flexed trunk , and weight shift right - mildly flexed trunk and right weight shift in standing and with ambulation, L shoulder more rounded than R w/ mild shoulder hike  LOWER EXTREMITY ROM:     Active  Right Eval Left Eval  Hip flexion WNL WFL  Hip extension    Hip abduction    Hip adduction    Hip internal rotation    Hip external rotation    Knee flexion    Knee extension    Ankle dorsiflexion  2+  Ankle plantarflexion    Ankle inversion    Ankle eversion     (Blank rows = not tested)  LOWER EXTREMITY  MMT:    MMT Right Eval Left Eval  Hip flexion    Hip extension    Hip abduction    Hip adduction    Hip internal rotation    Hip external rotation    Knee flexion    Knee extension    Ankle dorsiflexion    Ankle plantarflexion    Ankle inversion    Ankle eversion    (Blank rows = not tested)  BED MOBILITY:  Findings: Sit to supine Complete Independence Supine to sit Complete Independence Rolling to Right Complete Independence Rolling to Left Complete Independence - needs increased time and momentum for L and R Ind w/ features of tempurpedic style bed  TRANSFERS: Sit to stand: SBA  Assistive device utilized: Single point cane     Stand to sit: SBA  Assistive device utilized: Single point cane     Chair to chair: SBA  Assistive device utilized: Single point cane       RAMP:  Not tested  CURB:  Not tested  STAIRS: Not tested GAIT: Findings: Distance walked: various clinic distances and Comments: Pt has mild right reliance w/ SPC, no foot scuff/toe catch even without AFO, shortened  stride and slightly diminished flexion pattern of LLE  FUNCTIONAL TESTS:  5 times sit to stand: 18.89 sec w/ heavy right trunk shortening and RUE reliance w/ L toe down vs full weight bearing  PATIENT SURVEYS:  ABC scale: The Activities-Specific Balance Confidence (ABC) Scale TBA                                                                                                                              TREATMENT DATE: 07/08/2024 -Started in B stance RDL style reach outside BOS to pick up bean bags from floor with L hemiparetic UE > squat w/ reach outside BOS for bean bag floor retrieval. -Seated lateral trunk rotation and forward lean to retrieve bean bags from floor unsupported and toss to midline target -Ladder drills forward step through 4x10 ft > laterally 2x10 ft -2 RLE elevated w/ LUE support for balloon taps w/ RUE x3 minutes, x2 LOB w/ pt exhibiting good righting response and  ability to pull himself into upright SBA  PATIENT EDUCATION: Education details:  Continue HEP.   Person educated: Patient Education method: Medical Illustrator Education comprehension: verbalized understanding and needs further education  HOME EXERCISE PROGRAM: Access Code: [facility] URL: [email or web address] Date: 03/21/2024 Prepared by: Daved Bull  Exercises - Seated Toe Raise  - 1-2 x daily - 4 x weekly - 2 sets - 10 reps - 2-3 seconds hold - Ankle Inversion Eversion Towel Slide  - 1-2 x daily - 4 x weekly - 2 sets - 10 reps - Seated Ankle Circles  - 1-2 x daily - 4 x weekly - 2 sets - 10 reps - Seated Heel Raise  - 1-2 x daily - 4 x weekly - 2 sets - 10 reps - 2-3 seconds hold - Seated Ankle Inversion Eversion PROM  - 1 x daily - 7 x weekly - 2 sets - 10 reps - Staggered Sit-to-Stand  - 1 x daily - 4-5 x weekly - 2-3 sets - 10 reps - Seated Hamstring Stretch  - 1 x daily - 4-5 x weekly - 1 sets - 3-4 reps - 45 seconds hold - Hooklying Single Knee to Chest Stretch with Towel  - 1 x daily - 4-5 x weekly - 1 sets - 3-4 reps - 45 seconds hold - Seated Toe Towel Scrunches  - 1 x daily - 4 x weekly - 1-2 sets - 10 reps - Seated Arch Lifts  - 1 x daily - 4 x weekly - 1-2 sets - 10 reps - Standing March with Unilateral Counter Support  - 1 x daily - 5 x weekly - 2 sets - 20 reps - Bird Dog on Counter  - 1 x daily - 5 x weekly - 2 sets - 10 reps - Step Sideways with Arms Reaching  - 1 x daily - 5 x weekly - 2 sets - 10 reps  Desensitization Techniques: -Light touch/pressure:  using fingertip pressure to slowly move  up small segments of the affected body part until outside the area of pain and then back to where you started -Deep pressure:  using fingertips to squeeze in small segments starting outside the affected area, crossing over the affected area, and then to the other side of the affected area -Tapping:  fingertips tap with firm pressure over the  affected area, can move around the affected area as well -Brushing/scratching:  use fingertips to brush/scratch over and around the affected area -Textures:  use soft and rough textured items like cotton balls vs wash cloths to brush or rub with light into firm pressure over and around the affected area  Repeat these 3-4x per day.   GOALS: Goals reviewed with patient? Yes  SHORT TERM GOALS: Target date: 04/08/2024  Pt will be independent and compliant with introductory strength and balance HEP in order to maintain functional progress and improve mobility. Baseline:  IND and compliant (11/26) Goal status: MET  2.  Pt will demonstrate adequate balance strategies to maintain upright with forward/overhead/and floor reaching. Baseline:  Very narrow limits of stability - several near falls related to this; ongoing (11/26) Goal status: IN PROGRESS  3.  Pt will be independent with desensitization strategies for LLE hypersensitivity management. Baseline: Provided - pt has handout and actively working on in OT/PT (11/26) Goal status: MET  LONG TERM GOALS: Target date: 05/06/2024  Pt will be independent and compliant with advanced and finalized strength and balance HEP in order to maintain functional progress and improve mobility. Baseline: To be established; pt feels he is doing moderately well with frequency of his HEP (12/4) Goal status: PARTIALLY MET  2.  Patient will improve ABC Scale score to >/=76% in order to demonstrate decreased risk for falls and increased confidence in balance. Baseline: 66.875% (10/14); 80% (12/4) Goal status: MET  3.  Patient will demonstrate floor recovery w/ no more than SBA in order to improve safety and home management. Baseline: Reports recent minA even w/ support surface; minA to flip from bottom to knees using support surface (12/4) Goal status: IN PROGRESS  4.  Pt will decrease 5xSTS to </=12 seconds w/ improved midline mechanics in order to demonstrate  decreased risk for falls and improved functional bilateral LE strength and power. Baseline: 18.89 sec w/ heavy right trunk shortening and RUE reliance w/ L toe down vs full weight bearing; 13.97 sec w/ light BUE support and mildly R rear staggered stance (12/4) Goal status: PARTIALLY MET  GOALS (at 87/5 re-cert): Goals reviewed with patient? Yes SHORT TERM GOALS = LONG TERM GOALS: Target date: 06/03/2024  Pt will be independent and compliant with advanced and finalized strength and balance HEP in order to maintain functional progress and improve mobility. Baseline: To be established; pt feels he is doing moderately well with frequency of his HEP (12/4) Goal status: ONGOING  2.  Patient will improve ABC Scale score to >/=90% in order to demonstrate decreased risk for falls and increased confidence in balance. Baseline: 66.875% (10/14); 80% (12/4); 85.625% (1/16) Goal status: IN PROGRESS  3.  Patient will demonstrate floor recovery w/ no more than SBA in order to improve safety and home management. Baseline: Reports recent minA even w/ support surface; minA to flip from bottom to knees using support surface (12/4) Goal status: ONGOING  4.  Pt will decrease 5xSTS to </=12 seconds w/ improved midline mechanics in order to demonstrate decreased risk for falls and improved functional bilateral LE strength and power. Baseline: 18.89 sec  w/ heavy right trunk shortening and RUE reliance w/ L toe down vs full weight bearing; 13.97 sec w/ light BUE support and mildly R rear staggered stance (12/4); 17.81 sec w/ ongoing mild staggered stance and light BUE support (1/16) Goal status: NOT MET  GOALS (at 8/83 re-cert): Goals reviewed with patient? Yes SHORT TERM GOALS: Target date: 07/15/2024  Pt will be independent and compliant with introductory strength and balance HEP in order to maintain functional progress and improve mobility. Baseline:  IND and compliant (11/26) Goal status: INITIAL  LONG TERM  GOALS: Target date: 08/12/2024  Pt will be independent and compliant with advanced and finalized strength and balance HEP in order to maintain functional progress and improve mobility. Baseline: To be established; pt feels he is doing moderately well with frequency of his HEP (12/4) Goal status: ONGOING  2.  Patient will improve ABC Scale score to >/=90% in order to demonstrate decreased risk for falls and increased confidence in balance. Baseline: 66.875% (10/14); 80% (12/4); 85.625% (1/16) Goal status: INITIAL  3.  Patient will demonstrate floor recovery w/ no more than SBA in order to improve safety and home management. Baseline: Reports recent minA even w/ support surface; minA to flip from bottom to knees using support surface (12/4) Goal status: ONGOING  4.  Pt will decrease 5xSTS to </=12 seconds w/ improved midline mechanics in order to demonstrate decreased risk for falls and improved functional bilateral LE strength and power. Baseline: 18.89 sec w/ heavy right trunk shortening and RUE reliance w/ L toe down vs full weight bearing; 13.97 sec w/ light BUE support and mildly R rear staggered stance (12/4); 17.81 sec w/ ongoing mild staggered stance and light BUE support (1/16) Goal status: ONGOING  5.  Pt will demonstrate safe gait mechanics with new custom AFO. Baseline: difficulty ambulating w/ OTS AFO prior - poor quality step to pattern Goal status: INITIAL  ASSESSMENT:  CLINICAL IMPRESSION: Focus of skilled PT session today on ongoing weight acceptance of the LLE during dynamic and static stability tasks.  Varied positioning to bias SLS and weight shifting in sitting and standing as well as to challenge limits of stability.  He exhibits good righting responses and better core engagement though rotation to the left remains somewhat limited compared to his right.  He continues to benefit from skilled PT in this setting to further address gait mechanics and decrease fall risk.  Will  continue to address all other deficits outlined in ongoing skilled PT POC.  OBJECTIVE IMPAIRMENTS: Abnormal gait, decreased activity tolerance, decreased balance, decreased cognition, decreased coordination, decreased endurance, decreased knowledge of use of DME, decreased strength, impaired sensation, impaired tone, improper body mechanics, and postural dysfunction.   ACTIVITY LIMITATIONS: carrying, lifting, bending, squatting, stairs, transfers, reach over head, and locomotion level  PARTICIPATION LIMITATIONS: laundry, shopping, and community activity  PERSONAL FACTORS: Age, Fitness, Past/current experiences, Time since onset of injury/illness/exacerbation, and 1-2 comorbidities: HTN, Afib are also affecting patient's functional outcome.   REHAB POTENTIAL: Good  CLINICAL DECISION MAKING: Stable/uncomplicated  EVALUATION COMPLEXITY: Moderate  PLAN:  PT FREQUENCY: 2x/week + 2x/wk + 1x/wk  PT DURATION: 8 weeks + 4 wks + 8 wks  PLANNED INTERVENTIONS: [id number]- PT Re-evaluation, 97750- Physical Performance Testing, 97110-Therapeutic exercises, 97530- Therapeutic activity, [id number]- Neuromuscular re-education, 97535- Self Care, [id number]- Manual therapy, [id number]- Gait training, [phone]- Orthotic Initial, [phone]- Orthotic/Prosthetic subsequent, [phone]- Aquatic Therapy, [phone]- Electrical stimulation (manual), Patient/Family education, Balance training, Stair training, Taping, Joint mobilization, Vestibular training, and DME instructions  PLAN FOR NEXT SESSION:  Floor recovery - incorporate tight space setup - repeat w/ full transition to bottom and incorporate LLE use to recover to standing - pt wants to work towards no UE support if possible (did discuss limitations and safety recs 11/20).  LLE NMR/strength/reaction time.  Core strength - work mat table level on tasks to improve floor recovery from stomach and supine.  Perturbations/resisted gait/farmer's carry. Glut strengthening.    Pt felt challenged  w/: 4 elevated RLE for L SLS for mirror squigz Wide STS Offset squats/mini lunges Hamstring pushbacks - strap to prevent squatting Wedge STS progress to no UE support Stairs/step taps/up; incline step through Standing 10lb kettlebell hip flexion LLE (isometric DF) Walking beanbag DF SLS taps to top step on L leg > L step ups Prolonged L stance 6 step w/ PT facilitating ankle posture Decline RLE step through working into lengthened stride 10 minutes on core at end of session - standing?  Pt declines offer of aquatic therapy at evaluation.  Daved KATHEE Bull, PT, DPT 07/08/2024, 4:29 PM        "

## 2024-07-11 ENCOUNTER — Ambulatory Visit

## 2024-07-11 ENCOUNTER — Ambulatory Visit: Admitting: Physical Therapy

## 2024-07-12 ENCOUNTER — Ambulatory Visit: Admitting: Physical Therapy

## 2024-07-12 ENCOUNTER — Ambulatory Visit

## 2024-07-15 ENCOUNTER — Ambulatory Visit

## 2024-07-18 ENCOUNTER — Ambulatory Visit: Admitting: Physical Therapy

## 2024-07-18 ENCOUNTER — Ambulatory Visit

## 2024-07-22 ENCOUNTER — Ambulatory Visit

## 2024-07-25 ENCOUNTER — Ambulatory Visit: Admitting: Physical Therapy

## 2024-07-25 ENCOUNTER — Ambulatory Visit

## 2024-07-29 ENCOUNTER — Ambulatory Visit

## 2024-08-04 ENCOUNTER — Ambulatory Visit: Admitting: Physical Therapy

## 2024-08-11 ENCOUNTER — Ambulatory Visit: Admitting: Physical Therapy

## 2024-08-31 ENCOUNTER — Ambulatory Visit: Admitting: Neurology
